# Patient Record
Sex: Female | Born: 1935
Health system: Southern US, Community
[De-identification: ages and names within clinical notes are randomized; demographics above are authoritative.]

## PROBLEM LIST (undated history)

## (undated) ENCOUNTER — Emergency Department (HOSPITAL_COMMUNITY): Admission: EM | Disposition: A | Discharge status: Home / Self Care | Payer: Medicare Other

## (undated) DIAGNOSIS — K922 Gastrointestinal hemorrhage, unspecified: Secondary | ICD-10-CM

## (undated) DIAGNOSIS — K229 Disease of esophagus, unspecified: Secondary | ICD-10-CM

## (undated) DIAGNOSIS — M1990 Unspecified osteoarthritis, unspecified site: Secondary | ICD-10-CM

## (undated) DIAGNOSIS — E785 Hyperlipidemia, unspecified: Secondary | ICD-10-CM

## (undated) DIAGNOSIS — C641 Malignant neoplasm of right kidney, except renal pelvis: Secondary | ICD-10-CM

## (undated) DIAGNOSIS — R519 Headache, unspecified: Secondary | ICD-10-CM

## (undated) DIAGNOSIS — I10 Essential (primary) hypertension: Secondary | ICD-10-CM

## (undated) DIAGNOSIS — N289 Disorder of kidney and ureter, unspecified: Secondary | ICD-10-CM

## (undated) DIAGNOSIS — K219 Gastro-esophageal reflux disease without esophagitis: Secondary | ICD-10-CM

## (undated) DIAGNOSIS — K635 Polyp of colon: Secondary | ICD-10-CM

## (undated) DIAGNOSIS — I5032 Chronic diastolic (congestive) heart failure: Secondary | ICD-10-CM

## (undated) DIAGNOSIS — E039 Hypothyroidism, unspecified: Secondary | ICD-10-CM

## (undated) DIAGNOSIS — R51 Headache: Secondary | ICD-10-CM

## (undated) DIAGNOSIS — K559 Vascular disorder of intestine, unspecified: Secondary | ICD-10-CM

## (undated) DIAGNOSIS — Z8619 Personal history of other infectious and parasitic diseases: Secondary | ICD-10-CM

## (undated) DIAGNOSIS — K8000 Calculus of gallbladder with acute cholecystitis without obstruction: Secondary | ICD-10-CM

## (undated) DIAGNOSIS — F419 Anxiety disorder, unspecified: Secondary | ICD-10-CM

## (undated) DIAGNOSIS — R9431 Abnormal electrocardiogram [ECG] [EKG]: Secondary | ICD-10-CM

## (undated) HISTORY — DX: Anxiety disorder, unspecified: F41.9

## (undated) HISTORY — DX: Abnormal electrocardiogram (ECG) (EKG): R94.31

## (undated) HISTORY — DX: Essential (primary) hypertension: I10

## (undated) HISTORY — DX: Gastro-esophageal reflux disease without esophagitis: K21.9

## (undated) HISTORY — PX: CATARACT EXTRACTION: SUR2

## (undated) HISTORY — DX: Disorder of kidney and ureter, unspecified: N28.9

## (undated) HISTORY — DX: Hyperlipidemia, unspecified: E78.5

## (undated) HISTORY — DX: Vascular disorder of intestine, unspecified: K55.9

## (undated) HISTORY — PX: COLONOSCOPY: SHX174

## (undated) HISTORY — DX: Polyp of colon: K63.5

## (undated) HISTORY — DX: Personal history of other infectious and parasitic diseases: Z86.19

## (undated) HISTORY — DX: Calculus of gallbladder with acute cholecystitis without obstruction: K80.00

## (undated) HISTORY — PX: HERNIA REPAIR: SHX51

## (undated) HISTORY — DX: Malignant neoplasm of right kidney, except renal pelvis: C64.1

---

## 1976-11-26 HISTORY — PX: ABDOMINAL HYSTERECTOMY: SHX81

## 1980-11-26 HISTORY — PX: NEPHRECTOMY: SHX65

## 1999-03-09 ENCOUNTER — Other Ambulatory Visit: Admission: RE | Admit: 1999-03-09 | Discharge: 1999-03-09 | Payer: Self-pay | Admitting: Gynecology

## 1999-09-26 ENCOUNTER — Ambulatory Visit (HOSPITAL_COMMUNITY): Admission: RE | Admit: 1999-09-26 | Discharge: 1999-09-26 | Payer: Self-pay | Admitting: Gastroenterology

## 1999-09-26 ENCOUNTER — Encounter (INDEPENDENT_AMBULATORY_CARE_PROVIDER_SITE_OTHER): Payer: Self-pay

## 1999-12-14 ENCOUNTER — Encounter: Admission: RE | Admit: 1999-12-14 | Discharge: 1999-12-14 | Payer: Self-pay | Admitting: Urology

## 1999-12-14 ENCOUNTER — Encounter: Payer: Self-pay | Admitting: Urology

## 2000-06-10 ENCOUNTER — Inpatient Hospital Stay (HOSPITAL_COMMUNITY): Admission: EM | Admit: 2000-06-10 | Discharge: 2000-06-12 | Payer: Self-pay | Admitting: Gastroenterology

## 2000-06-10 ENCOUNTER — Encounter (INDEPENDENT_AMBULATORY_CARE_PROVIDER_SITE_OTHER): Payer: Self-pay

## 2000-06-10 ENCOUNTER — Encounter: Payer: Self-pay | Admitting: Gastroenterology

## 2000-07-19 ENCOUNTER — Encounter: Payer: Self-pay | Admitting: Gastroenterology

## 2000-07-19 ENCOUNTER — Encounter (INDEPENDENT_AMBULATORY_CARE_PROVIDER_SITE_OTHER): Payer: Self-pay

## 2000-07-19 ENCOUNTER — Inpatient Hospital Stay (HOSPITAL_COMMUNITY): Admission: EM | Admit: 2000-07-19 | Discharge: 2000-07-25 | Payer: Self-pay | Admitting: *Deleted

## 2000-07-23 ENCOUNTER — Encounter: Payer: Self-pay | Admitting: Gastroenterology

## 2001-02-21 ENCOUNTER — Emergency Department (HOSPITAL_COMMUNITY): Admission: EM | Admit: 2001-02-21 | Discharge: 2001-02-21 | Payer: Self-pay | Admitting: Emergency Medicine

## 2001-02-21 ENCOUNTER — Encounter: Payer: Self-pay | Admitting: Emergency Medicine

## 2001-03-03 ENCOUNTER — Ambulatory Visit (HOSPITAL_COMMUNITY): Admission: RE | Admit: 2001-03-03 | Discharge: 2001-03-03 | Payer: Self-pay | Admitting: Gastroenterology

## 2001-03-03 ENCOUNTER — Encounter (INDEPENDENT_AMBULATORY_CARE_PROVIDER_SITE_OTHER): Payer: Self-pay

## 2001-12-24 ENCOUNTER — Emergency Department (HOSPITAL_COMMUNITY): Admission: EM | Admit: 2001-12-24 | Discharge: 2001-12-24 | Payer: Self-pay | Admitting: Emergency Medicine

## 2001-12-31 ENCOUNTER — Other Ambulatory Visit: Admission: RE | Admit: 2001-12-31 | Discharge: 2001-12-31 | Payer: Self-pay | Admitting: Gynecology

## 2002-03-09 ENCOUNTER — Encounter: Admission: RE | Admit: 2002-03-09 | Discharge: 2002-03-09 | Payer: Self-pay | Admitting: Urology

## 2002-03-09 ENCOUNTER — Encounter: Payer: Self-pay | Admitting: Urology

## 2003-04-30 ENCOUNTER — Emergency Department (HOSPITAL_COMMUNITY): Admission: EM | Admit: 2003-04-30 | Discharge: 2003-04-30 | Payer: Self-pay | Admitting: Emergency Medicine

## 2003-07-05 ENCOUNTER — Encounter: Payer: Self-pay | Admitting: Urology

## 2003-07-05 ENCOUNTER — Encounter: Admission: RE | Admit: 2003-07-05 | Discharge: 2003-07-05 | Payer: Self-pay | Admitting: Urology

## 2004-12-13 ENCOUNTER — Ambulatory Visit: Payer: Self-pay

## 2005-01-11 ENCOUNTER — Other Ambulatory Visit: Admission: RE | Admit: 2005-01-11 | Discharge: 2005-01-11 | Payer: Self-pay | Admitting: Gynecology

## 2005-09-28 ENCOUNTER — Ambulatory Visit: Payer: Self-pay | Admitting: Cardiovascular Disease

## 2005-10-04 ENCOUNTER — Ambulatory Visit: Payer: Self-pay | Admitting: Cardiology

## 2005-12-13 ENCOUNTER — Ambulatory Visit: Payer: Self-pay | Admitting: Cardiovascular Disease

## 2006-01-04 ENCOUNTER — Ambulatory Visit: Payer: Self-pay | Admitting: Internal Medicine

## 2006-04-08 ENCOUNTER — Ambulatory Visit: Payer: Self-pay

## 2006-08-13 ENCOUNTER — Emergency Department (HOSPITAL_COMMUNITY): Admission: EM | Admit: 2006-08-13 | Discharge: 2006-08-13 | Payer: Self-pay | Admitting: Emergency Medicine

## 2007-09-30 ENCOUNTER — Encounter (INDEPENDENT_AMBULATORY_CARE_PROVIDER_SITE_OTHER): Payer: Self-pay | Admitting: Surgery

## 2007-09-30 ENCOUNTER — Ambulatory Visit (HOSPITAL_BASED_OUTPATIENT_CLINIC_OR_DEPARTMENT_OTHER): Admission: RE | Admit: 2007-09-30 | Discharge: 2007-09-30 | Payer: Self-pay | Admitting: Surgery

## 2008-11-26 DIAGNOSIS — C641 Malignant neoplasm of right kidney, except renal pelvis: Secondary | ICD-10-CM

## 2008-11-26 DIAGNOSIS — K635 Polyp of colon: Secondary | ICD-10-CM

## 2008-11-26 HISTORY — DX: Malignant neoplasm of right kidney, except renal pelvis: C64.1

## 2008-11-26 HISTORY — DX: Polyp of colon: K63.5

## 2008-11-26 HISTORY — PX: NEPHRECTOMY: SHX65

## 2008-11-26 LAB — HM COLONOSCOPY

## 2009-03-04 ENCOUNTER — Ambulatory Visit: Payer: Self-pay | Admitting: Vascular Surgery

## 2009-06-28 ENCOUNTER — Encounter: Admission: RE | Admit: 2009-06-28 | Discharge: 2009-06-28 | Payer: Self-pay | Admitting: Gynecology

## 2009-12-20 ENCOUNTER — Encounter: Admission: RE | Admit: 2009-12-20 | Discharge: 2009-12-20 | Payer: Self-pay | Admitting: Emergency Medicine

## 2010-03-30 ENCOUNTER — Ambulatory Visit (HOSPITAL_COMMUNITY): Admission: RE | Admit: 2010-03-30 | Discharge: 2010-03-30 | Payer: Self-pay | Admitting: *Deleted

## 2010-12-16 ENCOUNTER — Encounter: Payer: Self-pay | Admitting: Cardiovascular Disease

## 2010-12-17 ENCOUNTER — Encounter: Payer: Self-pay | Admitting: Emergency Medicine

## 2011-03-12 ENCOUNTER — Other Ambulatory Visit: Payer: Self-pay | Admitting: Gastroenterology

## 2011-03-12 LAB — HM COLONOSCOPY

## 2011-04-03 LAB — LIPID PANEL
LDL Cholesterol: 142 mg/dL
LDl/HDL Ratio: 6.8

## 2011-04-04 LAB — LIPID PANEL: Cholesterol: 239 mg/dL — AB (ref 0–200)

## 2011-04-10 NOTE — Op Note (Signed)
Sherri Burns, Sherri Burns             ACCOUNT NO.:  [id number]   MEDICAL RECORD NO.:  [id number]          PATIENT TYPE:  AMB   LOCATION:  DSC                          FACILITY:  [facility]   PHYSICIAN:  Thornton Park. Daphine Deutscher, MD  DATE OF BIRTH:  1936/07/15   DATE OF PROCEDURE:  09/30/2007  DATE OF DISCHARGE:  09/30/2007                               OPERATIVE REPORT   PREOPERATIVE DIAGNOSIS:  Mass of the umbilicus, probable keloid.   POSTOPERATIVE DIAGNOSIS:  Mass of the umbilicus, probable keloid.   PROCEDURE:  Excision of mass of umbilicus.   SURGEON:  Thornton Park. Daphine Deutscher, M.D.   ANESTHESIA:  MAC with local, injection of Kenalog.   DESCRIPTION OF PROCEDURE:  Ms. Topping was taken to room 5 at [facility] on September 30, 2007, and given intravenous sedation.  The area  was prepped with TechniCare and draped sterilely.  I infiltrated it with  a mixture Marcaine and lidocaine.  I then excised basically an ellipse  vertically excising this hard mass.  Cutting through the roots, it felt  like it was a keloid.  Once I removed it, I cauterized the base and  mobilized the skin.  I then closed this with 4-0 Vicryl subcutaneously  and subcuticularly and with a single vertical mattress suture of 5-0  nylon.  I injected this with Kenalog at the end, put a cotton ball  there, and dressed it and sent her home.  She will take Vicodin as  needed for pain.  She will come back for suture removal in one week.      Thornton Park Daphine Deutscher, MD  Electronically Signed     MBM/MEDQ  D:  09/30/2007  T:  09/30/2007  Job:  [id number]   cc:   Brett Canales A. Cleta Alberts, M.D.  Fax: [phone]

## 2011-04-10 NOTE — Procedures (Signed)
DUPLEX DEEP VENOUS EXAM - LOWER EXTREMITY   INDICATION:  Right lower extremity pain intermittently, more recently.   HISTORY:  Edema:  Right lower extremity.  Trauma/Surgery:  No.  Pain:  Right lower extremity.  PE:  No.  Previous DVT:  No.  Anticoagulants:  No.  Other:   DUPLEX EXAM:                CFV   SFV   PopV  PTV    GSV                R  L  R  L  R  L  R   L  R  L  Thrombosis    o  o  o     o     o      o  Spontaneous   +  +  +     +     +      +  Phasic        +  +  +     +     +      +  Augmentation  +  +  +     +     +      +  Compressible  +  +  +     +     +      +  Competent     +  +  +     +     +      +   Legend:  + - yes  o - no  p - partial  D - decreased   IMPRESSION:  1. No evidence of deep vein thrombosis or superficial  vein thrombosis      in the right lower extremity or the left common femoral vein.  2. Note:  Right posterior tibial artery appears normal and triphasic.    _____________________________  Quita Skye. Hart Rochester, M.D.   AS/MEDQ  D:  03/04/2009  T:  03/04/2009  Job:  [id number]

## 2011-04-13 NOTE — H&P (Signed)
[facility]  Patient:    Sherri Burns, Sherri Burns                    MRN: [id number] Adm. Date:  [id number] Attending:  Deneen Harts CC:         Lonzo Cloud. Kriste Basque, M.D. [facility]   History and Physical  REASON FOR ADMISSION:  Ms. Bleecker is a 75 year old African-American female, who presents with abdominal pain and leukocytosis.  HISTORY OF PRESENT ILLNESS:  The patient was hospitalized approximately six weeks ago with acute ischemic colitis.  It has resolved with conservative measures.  She was seen subsequently in follow-up and was doing well.  Several weeks ago, she developed an acute upper respiratory tract infection, for which she saw her primary care physician, Dr. Alroy Dust.  Antibiotic therapy was prescribed, and she has completed a two-week (?) course of this and was started on a Medrol dosepak.  Her symptoms have improved.  Beginning two days ago, the patient began developing significant abdominal distention.  Associated abdominal pain with lower quadrant cramping.  Today she passed a hard stool, followed by copious watery diarrhea, approximately eight to 10 bowel movements.  She is concerned that recurrent ischemic colitis had developed.  No hematochezia noted, however.  The patient denies constitutional symptoms of nausea, vomiting.  Appetite is poor.  No fever, rigor, or chill.  Pain is bilateral lower quadrant cramping, partially relieved with bowel movements.  PAST MEDICAL HISTORY: 1. Hypertension. 2. Hypercholesterolemia. 3. Reflux. 4. IBS.  REVIEW OF SYSTEMS:  Fatigue, weakness.  Marked decreased appetite.  Bronchitic symptoms, resolved.  PHYSICAL EXAMINATION:  GENERAL:  Mildly ill-appearing, middle-aged African-American female.  VITAL SIGNS:  Stable without fever.  HEENT:  Anicteric sclerae, pink conjunctivae.  No oropharyngeal lesions.  NECK:  Supple without adenopathy, no thyromegaly or bruit.  CHEST:  Clear to auscultation  without adventitious sounds.  CARDIAC:  Regular rhythm, no gallops or murmur.  ABDOMEN:  Obese, soft, nondistended, tender lower quadrants greater than upper quadrants, no rebound, no guarding.  No firmness, no mass.  No organomegaly.  RECTAL:  No perianal or intrarectal pathology.  Stool:  Watery, Hemoccult-negative, with positive quality control.  EXTREMITIES:  Without clubbing, cyanosis, or edema.  SKIN:  No lesion.  LABORATORY DATA:  CBC with differential:  WBC 26.1 thousand with 88% neutrophils.  Hemoglobin 15, hematocrit 45.  CMET:  Abnormal for a calcium 10.9, total protein 8.1, glucose 174, otherwise normal.  ASSESSMENT: 1. Abdominal pain. 2. Leukocytosis.  The etiology of patients current presentation is uncertain.  I am most concerned about the possibility of an intraperitoneal abscess or intestinal perforation, given recent past history of ischemic colitis.  Although fever would be expected, this may be masked by initiation of a Medrol dosepak yesterday.  Leukocytosis is clearly worrisome, and this is the main finding leading to hospital stay.  The patient will be closely monitored.  Other considerations include diverticulitis, cystitis, pancreatitis.  RECOMMENDATION: 1. Admit. 2. IV fluids. 3. Will hold empiric antibiotics unless fever develops or patients condition    worsens. 4. Diagnostic workup:  Abdominal CT, CBC.  Stool workup:  C/S, C. difficile    toxin, O/P, fecal leukocytes.  Urinalysis.  Check parathyroid hormone level    given hypercalcemia. 5. Abdominal/pelvic CT. DD:  07/19/00 TD:  07/20/00 Job: [id number]

## 2011-04-13 NOTE — Discharge Summary (Signed)
[facility]  Patient:    Sherri Burns, Sherri Burns                    MRN: [id number] Adm. Date:  [id number] Disc. Date: [id number] Attending:  Deneen Harts CC:         Sherri Cloud. Sherri Burns, M.D. Sherri Burns  Sherri Burns, M.D.  Sherri Burns, M.D.   Discharge Summary  DISCHARGE DIAGNOSES: 1. Ischemic colitis. 2. Pseudomembranous colitis. 3. Candida esophagitis. 4. Secondary hyperparathyroidism. 5. Hypertension. 6. Hyperlipidemia.  HOSPITAL COURSE:  Sherri Burns is a 75 year old African-American female who was admitted through the emergency room with symptoms of abdominal distention, lower abdominal pain, diarrhea.  She had a marked leukocytosis greater than 26,000 on admission.  Recent past history is significant for an acute episode of ischemic colitis approximately one month previously.  This was thought to be due to birth control pills, and she was subsequently switched to an estrogen patch.  She was also being treated with antihypertensives including Prinivil and Cardizem 180 mg daily.  The patients leukocytosis gradually improved with supportive therapy including bowel rest, bed rest, IV fluids.  She was hemoccult positive, and this subsequently became negative on the fifth hospital day.  Colonoscopy was performed with excellent visualization to the right colon which was poorly seen due to unprepped state.  Biopsies were not obtained, and colonoscopic appearance was classic with nodular ulceration well demarcated and limited to the region of the splenic flexure.  The patient steadily improved, and her diet was advanced.  Endoscopy was performed at the time of colonoscopy because of complaint of foreign body sensation at the base of the throat and burning substernal chest pain.  She was found to have Candida esophagitis for which Diflucan was initiated.  Hypertension was, too, well controlled with blood pressure falling as low as 74/50.  This led to  discontinuation of Prinivil and reduction of Cardizem 180 mg to 120 mg daily.  With this, blood pressure remained within normal limits.  This raises the possibility of antihypertensive therapy inducing ischemic colitis because of transient hypotension.  Finally, the patient was noted to be hypercalcemic on admission with slight elevation of calcium on routine panel at 10.9, upper normal 10.6.  Parathyroid hormone was found to be markedly elevated, approximately twice normal at 151, normal range 12-72.  Endocrine consultation provided by Dr. Reather Burns with an assessment of probable secondary hyperparathyroidism due to her catabolic state and dehydration.  Additional laboratory included conversion of positive hemoccult to negative on the fifth hospital day.  Leukocytosis improved from 26,000 to 13,700 by July 24, 2000.  Sed rate remained normal at 17. TSH was normal at 2.6. Hemoglobin and hematocrit remained normal throughout.  Stool was positive for C. difficile toxin, negative C/S.  Moderate leukocytosis was present.  No ova or parasite identified.  Final study performed was that of an MRI which documented widely patent superior mesenteric artery, IMA, and celiac access.  Minimal proximal disease without evidence of stenosis.  Abdominal/pelvic CT consistent with a right nephrectomy remotely.  No other abnormality was identified.  RECOMMENDATIONS: 1. The patient is discharged home in stable condition. 2. Low-residue diet. 3. Progressive activity ad lib. 4. Discontinue Prinivil. 5. Reduce Cardizem 120 mg daily. 6. Complete 10-day course of Flagyl 250 mg q.i.d. for pseudomembranous    colitis. 7. Complete 10-day course of Diflucan 100 mg daily for Candida esophagitis. 8. Discontinue diuretic.  DISCHARGE MEDICATIONS: 1. Cardizem 120 mg 1 p.o. q.a.m. 2. Flagyl  250 mg 1 p.o. q.d. for 6 days. 3. Diflucan 100 mg daily for 6 days. 4. Xanax 0.5 mg, 1/2-1 tablet p.o. t.i.d. p.r.n. 5.  Lipitor 20 mg daily.  FOLLOWUP:  ______ to see Dr. Kinnie Scales in one to two weeks.  ______ to see Dr. Kriste Burns in two to four weeks for monitoring of antihypertensive therapy and to follow up probably secondary hypoparathyroidism with repeat calcium/intact parathyroid hormone level in approximately one month. DD:  07/25/00 TD:  07/26/00 Job: [id number]

## 2011-04-13 NOTE — Discharge Summary (Signed)
[facility]  Patient:    Sherri Burns, Sherri Burns                    MRN: [id number] Adm. Date:  [id number] Disc. Date: [id number] Attending:  Deneen Harts CC:         Beryle Lathe, M.D.             Lonzo Cloud. Kriste Basque, M.D. [facility]                           Discharge Summary  DISCHARGE DIAGNOSES:  1. Acute ischemic colitis.  2. Hypertension.  3. Chronic bronchitis.  4. Gastroesophageal reflux disease.  5. Colon adenomatous polyp.  6. Chronic constipation.  7. Anxiety.  8. Hypertension.  9. Hyperlipidemia. 10. Allergy.  HOSPITAL COURSE:  Patient presented to the hospital with a 24-hour history of rectal bleeding characterized by crampy abdominal pain and bloody mucoid stool, low-grade fever and leukocytosis on admission.  Physical exam with abdominal pain.  Emergency colonoscopy for diagnostic purposes was performed. This revealed an acute ischemic left colon.  Biopsies were obtained; pathology pending.  The patient was placed on bedrest and bowel rest.  IV fluids were administered along with analgesia.  Over the next 48 hours, patients symptoms have promptly subsided.  At the time of discharge, her abdominal pain has resolved.  She has not had a bowel movement since colonoscopy.  She is tolerating a low residue diet.  Fever and leukocytosis have resolved.  There has been no rectal bleeding.  The most likely promoter of ischemic colitis in this individual is severe constipation, treated with aggressive magnesium citrate laxative; hormone-replacement therapy is also suspect.  CURRENT MEDICAL REGIMEN:  1. Xanax 0.5 mg q.8h. p.r.n.  2. Zocor 40 mg daily.  3. Prevacid 30 mg daily.  4. Diltiazem CD 180 mg daily.  5. Prinivil 10 mg daily.  6. Miralax one capful daily.  DISCHARGE INSTRUCTIONS:  1. Resume current medical regimen except Premarin and Estratest are to be     discontinued.  2. Low residue diet for one month.  3. Physical activity to be limited  over the next week to ambulation ad lib     but no physical exertion.  4. Return office visit:  One week.  5. Resume Miralax one capful nightly. DD:  06/12/00 TD:  06/13/00 Job: [id number]

## 2011-04-13 NOTE — H&P (Signed)
[facility]  Patient:    Sherri Burns, Sherri Burns                    MRN: [id number] Adm. Date:  [id number] Attending:  Deneen Harts                         History and Physical  REASON FOR ADMISSION:  Acute ischemic colitis.  HISTORY OF PRESENT ILLNESS:  [name] developed acute onset of abdominal pain yesterday evening, localized to the left lower quadrant, exacerbated with bowel movements.  These were bloody with bright red blood and maroon clot.  No stool.  Patient has a history of chronic constipation, which has been relieved with Miralax.  She became constipated after her Miralax ran out for five days, then used a bottle of magnesium citrate with subsequent symptoms, as noted. These continued until approximately midnight.  She was seen earlier this morning in the office and emergency colonoscopy was performed.  Colonoscopy revealed acute left colon ischemic colitis.  Biopsy taken.  The rectosigmoid and proximal transverse and ascending colon appeared normal.  In addition, patient has known diverticulosis but there was no evidence of acute diverticulitis; also with a history of colon polyps.  No additional neoplasia identified but this could easily have been missed due to the acute inflammation noted.  Patient is being admitted for supportive therapy, IV hydration and observation.  PAST MEDICAL HISTORY:  1. Hypertension.  2. Bronchitis.  3. GERD.  4. Colon adenomatous polyps.  5. Chronic constipation.  6. Anxiety.  7. Hypertension.  8. Hyperlipidemia.  9. Allergy.  PAST SURGICAL HISTORY:  Status post hysterectomy, right nephrectomy due to hypernephroma.  Abdominal lysis of adhesions.  CURRENT MEDICAL REGIMEN:  1. Prilosec 20 mg daily.  2. Miralax one capful daily.  3. Alprazolam.  4. Lipitor.  5. Diltiazem.  6. Prinivil.  7. Estrace.  8. Premarin.  9. Allegra D p.r.n. 10. Aspirin. 12. MVI. 13. Garlic. 14. Vitamin  E.  ALLERGY:  CODEINE.  SOCIAL HISTORY:  Patient is married, living with husband.  Retired.  Rare alcohol use.  No drug use.  FAMILY HISTORY:  Significant for mother with colon cancer, father and sister with esophageal cancer.  REVIEW OF SYSTEMS:  Overall health excellent.  CARDIAC:  History of murmur. RESPIRATORY:  History of bronchitis.  ENDOCRINE:  No history of diabetes. RENAL:  Status post nephrectomy.  No insufficiency.  MUSCULOSKELETAL:  No arthritis.  NEUROLOGIC:  Denies seizure disorder.  PHYSICAL EXAMINATION:  GENERAL:  Healthy-appearing, middle-aged African-American female, alert and oriented, in no acute distress.  VITAL SIGNS:  Stable with tachycardia, pulse 126, respirations 16, temperature 98.1, blood pressure 140/108.  Height 5 feet 6 inches, weight 179 pounds.  HEENT:  Anicteric sclerae.  Pink conjunctivae.  No oropharyngeal lesion.  NECK:  Supple.  No adenopathy or thyromegaly or bruit.  CHEST:  Clear to auscultation without adventitial sounds.  CARDIAC:  Regular rhythm.  No gallop and no murmur appreciated.  ABDOMEN:  Tender left lower quadrant.  No rebound.  No guarding.  No firmness or mass.  Bowel sounds active.  No borborygmi, bruit or splash.  EXTREMITIES:  No clubbing, cyanosis, or edema.  SKIN:  No lesion.  LABORATORY AND X-RAY FINDINGS:  CBC:  Abnormal WBC at 15,900, hemoglobin 15.8, hematocrit 50.2 -- probably due to hemoconcentration.  MCV 101, MCHC 31.4, platelets 305,000.  Colonoscopy -- see report.  ASSESSMENT:  Acute ischemic colitis --  left colon, probably exacerbated by estrogen therapy.  RECOMMENDATION:  Admit for IV hydration, observation, supportive therapy. DD:  06/10/00 TD:  06/11/00 Job: [id number]

## 2011-04-13 NOTE — Procedures (Signed)
[facility]  Patient:    Sherri Burns, Sherri Burns                    MRN: [id number] Proc. Date: 07/22/00 Adm. Date:  [id number] Attending:  Deneen Harts CC:         Lonzo Cloud. Kriste Basque, M.D. [facility]                           Procedure Report  PROCEDURE:  Colonoscopy.  INDICATIONS:  The patient is a 75 year old African-American female, admitted with a recent past history of ischemic colitis with recurrent episode, six weeks later of abdominal pain, diarrhea, occult positive stool, with leukocytosis.  She is undergoing colonoscopy to evaluate possible recurrent ischemic colitis.  Also with a finding of C. difficile toxin on stool. Culture to rule out pseudomembranous colitis.  The patient did receive an intercurrent course of antibiotic for acute bronchitis approximately one month ago.  INFORMED CONSENT:  After reviewing the nature of the procedure with the patient including potential risks and complications, and after discussing alternative methods of diagnosis and treatment, informed consent was signed.  PREMEDICATION: The patient was premedicated receiving Versed 10 mg, fentanyl 100 mcg administered in divided doses prior to the onset of the procedure.  DESCRIPTION OF PROCEDURE:  Using the Olympus PCF-140L video colonoscope, the rectum was intubated after normal digital examination.  The scope was advanced around the entire length of the colon to the cecum identified by the ileocecal valve.  The colon was unprepped.  The right colon had a film of stool coating it throughout its length.  This was irrigated clear to rule out underlying pseudomembranes and/or acute inflammation.  This appeared normal.  The scope was retracted with careful inspection throughout the colon in a retrograde manner.  In the distal transverse colon and involving the splenic flexure and proximal descending colon, approximately 15 cm of acutely inflamed colon.  The inflammation was  circumferential nodular, ulcerative with edema and erythema. This was consistent with ischemic colitis.  I would have expected much more healing if this were due to the ischemic insult approximately six weeks ago. I suspect this represents acute superimposed on chronic ischemic change. Biopsies were not obtained.  The remainder of the descending colon, sigmoid and rectum were notable only for sigmoid diverticulosis which was not inflamed.  No pseudomembranes were identified.  No evidence of acute infectious colitis seen.  The colon was decompressed, the scope withdrawn.  The patient tolerated the procedure without difficulty, being maintained on Datascope monitor and low-flow oxygen throughout.  Time:  One.  Technical:  One.  Preparation:  Three.  Total score equaled five.  ASSESSMENT: 1. Ischemic colitis--superimposed on chronic changes. 2. No evidence of pseudomembranes identified. 3. Diverticulosis--moderate, sigmoid involvement. 4. Right colon poorly seen due to unprepped colonoscopy.  RECOMMENDATIONS: 1. Continue supportive measures. 2. MRI to access superior mesenteric artery and inferior mesenteric    artery blood flow to the colon. 3. Flagyl 250 mg q.i.d. x 10 days to treat C. difficile toxin. 4. Gradually advance diet. 5. Change antihypertensive--patient presently on Cardizem and Prinivil.    Noted to have blood pressure as low as 74/50.  This may be the etiology of    recurrent ischemic colitis. DD:  07/22/00 TD:  07/23/00 Job: [id number]

## 2011-04-13 NOTE — Consult Note (Signed)
[facility]  Patient:    Sherri Burns, Sherri Burns                    MRN: [id number] Adm. Date:  [id number] Attending:  Deneen Harts CC:         Lonzo Cloud. Kriste Basque, M.D. [facility]             Griffith Citron, M.D.                          Consultation Report  REASON FOR CONSULTATION:  Evaluate parathyroid.  HISTORY OF PRESENT ILLNESS:  This is a 75 year old Afro-American patient admitted on August 24 for ischemic colitis.  The patients problem has been a recurrent problem for the last six weeks or so with prior problems with ischemic colitis in July also.  She has been acutely ill on the date of admission and had been somewhat dehydrated and sweaty.  Her calcium on admission was 10.9.  She also had mild increase in BUN and creatinine to 23 and 1.5, respectively, and a total protein of 8.1, which subsequently came down to 6.1.  Her calcium subsequently came down to 9 on August 29.  The patient has had no symptoms of bone pain except for pain on the right side of her hip, which sounds like bursitis, radiating down the leg.  She also has had some pain and stiffness in her fingers, especially in the morning.  She has not had any generalized bone pain.  She has had some low back pain related to her work.  She has not had any peptic ulcers or kidney stones.  The patient currently is asymptomatic except for abdominal symptoms.  MEDICATIONS:  Medications on admission were estrogen patch, Lipitor, Xanax, Prilosec, Prinivil, and diltiazem.  ALLERGIES:  CODEINE.  PAST MEDICAL HISTORY:  The patient has had a nephrectomy in 1982 for malignancy.  She has not had any fractures.   She has not had any other surgeries.  PERSONAL HISTORY:  She is a nonsmoker.  She is retired.  REVIEW OF SYSTEMS:  The patient has had hypertension, hypercholesterolemia, reflux, irritable bowel syndrome, and as above, she has had ischemic colitis and Candida esophagitis, for which she was  being treated.  Noticed to have diabetes with a glucose of 174 on admission.  She is menopausal, on estrogen replacement.  She was previously on multivitamins but none recently.  She had lost weight from not eating, probably about 12 pounds.  PHYSICAL EXAMINATION:  GENERAL:  The patient is alert and cooperative, very pleasant.  VITAL SIGNS:  Blood pressure is 120/76, temperature 98.9, pulse 72 and regular.  SKIN:  There is no pallor, lymphadenopathy, or edema.  HEENT:  Eyes:  Pupils are equal.  No pallor or conjunctival reaction.  Fundi not examined.  ENT exam was normal.  NECK:  No carotid bruit, no thyroid enlargement, no lymphadenopathy.  HEART:  Heart sounds are normal.  LUNGS:  Clear.  ABDOMEN:  Mild lower quadrant tenderness, no liver or spleen enlargement felt.  EXTREMITIES:  Normal.  ASSESSMENT:  This patient has a single high calcium on admission.  This in the face of dehydration and acute illness.  The patient apparently has been noted to have a prior history of hypercalcemia.  The parathyroid hormone level may or may not indicate a primary parathyroid hyperplasia or adenoma since this was done soon after the patient had been acutely ill, with poor intake.  This may be simply a reaction to poor dietary calcium intake and inactivity. Currently the patient is asymptomatic.  RECOMMENDATIONS:  I would recommend a repeat calcium level done subsequently as an outpatient once the patient is well.  If the calcium is high normal, a parathyroid hormone may be repeated; but with the patients asymptomatic status, one would be reluctant to recommend any parathyroid exploration.  She may also have a bone density screen done and if this is low, then a full evaluation for hyperparathyroidism may be more worthwhile.  Thanks for the consultation.  Please feel free to call me if there are any further questions. DD:  07/25/00 TD:  07/25/00 Job: [id number]

## 2011-04-13 NOTE — Procedures (Signed)
[facility]  Patient:    Sherri Burns, Sherri Burns                    MRN: [id number] Proc. Date: 06/10/00 Adm. Date:  [id number] Attending:  Deneen Harts CC:         Lonzo Cloud. Kriste Basque, M.D. [facility]                           Procedure Report  PROCEDURE:  Colonoscopy with biopsy.  INDICATION FOR PROCEDURE:  A 75 year old African-American female undergoing colonoscopy to evaluate acute onset of left lower quadrant abdominal pain with hematochezia. Onset of symptoms beginning yesterday afternoon. Approximately a dozen trips to the bathroom with passage of bright red blood and/or maroon clot. Associated weakness. No orthostatic symptoms. Prior colonoscopy with segmental colitis. Also with tubular adenoma. Undergoing colonoscopy on an urgent basis without prior preparation to further evaluate etiology of her symptoms. In addition, the patient is known to have universal diverticulosis which is mild.  DESCRIPTION OF PROCEDURE:  After reviewing the nature of the procedure with the patient including potential risks and complications, and after discussing alternative methods of diagnosis and treatment, informed consent was signed.  The patient was premedicated receiving IV sedation totaling versed 10 mg, fentanyl 100 mcg administered IV in divided doses prior to and during the course of the procedure.  Using an Olympus pediatric PCF-140L video colonoscope, the rectum was intubated after a digital examination revealed no evidence of perianal or intrarectal pathology.  The scope was advanced through a spastic sigmoid colon with modest difficulty. Heme was noted throughout the rectosigmoid. No solid stool. The scope advanced around the hepatic flexure into the mid ascending colon. The cecum was not reached due to poor preparation and patient complaints of abdominal pain. I did not wish to be overly vigorous given the findings of acute left-sided colitis.  The scope was  slowly withdrawn with careful inspection of the entire colon in a retrograde manner. The colon was normal through the right hepatic flexure, transverse colon and splenic flexure. The descending colon, through its entirety was involved with an acute inflammatory process. There were skip lesions of intense erythema edema mucosal ulceration with friability. Purplish discoloration of the involved mucosa. Biopsies obtained. Findings most consistent with an acute ischemic colitis. This extended to the mid sigmoid colon. Distal to this, beyond 25 cm from the rectum, the rectosigmoid was normal.  Diverticulosis was scattered throughout the colon, no evidence of acute inflammation.  The colon was decompressed, scope withdrawn.  The patient tolerated the procedure with mild discomfort but without complications. Maintained on DataScope monitor and low flow oxygen throughout.  Time 2, technical 2, preparation 3, total score equal 7.  ASSESSMENT:  Acute left colon ischemic colitis--biopsy pending.  RECOMMENDATION: 1. The patient will be admitted for support, monitoring, IV hydration. 2. Follow-up pathology. DD:  06/10/00 TD:  06/11/00 Job: [id number]

## 2011-04-13 NOTE — Procedures (Signed)
[facility]  Patient:    Sherri Burns, Sherri Burns                    MRN: [id number] Proc. Date: 07/22/00 Adm. Date:  [id number] Attending:  Deneen Harts CC:         Lonzo Cloud. Kriste Basque, M.D. [facility]   Procedure Report  PROCEDURE:  Panendoscopy with brushing.  INDICATIONS:  A 75 year old African-American female admitted to the hospital three days ago with recurrent ischemic colitis.  Over the past several days with sensation of a foreign body at the base of the throat, substernal and epigastric burning discomfort despite Protonix 40 mg daily.  Known history of reflux disease.  Undergoing endoscopy to further evaluate possible upper GI pathology.  DESCRIPTION OF PROCEDURE:  Immediately following colonoscopy, without benefit of further IV sedation, endoscopy successfully completed using an Olympus video endoscope.  Topical oropharyngeal anesthetic sprayed.  The oropharynx was normal without lesion of the epiglottis, vocal cords, or piriform sinus other than posterior pharyngeal exudate.  Upper esophageal sphincter easily traversed.  The proximal and mid esophagus were notable for colonies of white exudate studding the mucosal lining.  In the distal esophagus, this was less prominent.  Mucosal Z-line was distinct at 38 cm.  No hiatal hernia.  Gastric fundus, body, and antrum were normal.  Pylorus symmetric.  Duodenal bulb and second portion normal.  Retroflex view of the angularis, lesser curve, gastric cardia, and fundus negative.  Stomach was decompressed.  The scope withdrawn into the proximal esophagus.  Brush cytology obtained to rule out Candida infection.  Scope withdrawn.  The patient tolerated the procedure without difficulty being maintained on Datascope monitor and low-flow oxygen throughout.  Returned to recovery following endoscopic procedure.  ASSESSMENT: 1. Esophageal exudate consistent with Candida esophagitis - brush cytology     pending. 2. Normal stomach and duodenum.  RECOMMENDATION: 1. Initiate Diflucan 200 mg p.o. loading dose followed by 100 mg daily for the    next nine days. 2. Follow up brush cytologies. DD:  07/22/00 TD:  07/22/00 Job: [id number]

## 2011-09-04 LAB — I-STAT 8, (EC8 V) (CONVERTED LAB)
BUN: 20
Chloride: 107
HCT: 46
Hemoglobin: 15.6 — ABNORMAL HIGH
Operator id: 128471
Potassium: 4.7
Sodium: 142

## 2011-09-04 LAB — BASIC METABOLIC PANEL
Chloride: 106
GFR calc non Af Amer: 48 — ABNORMAL LOW
Sodium: 141

## 2011-10-23 LAB — HEMOGLOBIN A1C: Hgb A1c MFr Bld: 7.3 % — AB (ref 4.0–6.0)

## 2011-10-23 LAB — TSH: TSH: 9.06 u[IU]/mL — AB (ref <=5.90)

## 2011-12-25 ENCOUNTER — Telehealth: Payer: Self-pay

## 2011-12-25 NOTE — Telephone Encounter (Signed)
.  [facility] PT IS requesting med refills  for thyroid meds and reflux - [facility] _ [address] [facility] CB # [phone]

## 2011-12-28 MED ORDER — LEVOTHYROXINE SODIUM 50 MCG PO TABS: 50.0000 ug | ORAL_TABLET | Freq: Every day | ORAL | Status: DC

## 2011-12-28 MED ORDER — OMEPRAZOLE 20 MG PO CPDR: 20.0000 mg | DELAYED_RELEASE_CAPSULE | Freq: Every day | ORAL | Status: DC

## 2011-12-28 NOTE — Telephone Encounter (Signed)
Spoke with pt who stated she has appt scheduled for March 5 for f/up - couldn't get one any sooner. Requests RF of synthroid and prilosec to last until appt.

## 2011-12-28 NOTE — Telephone Encounter (Signed)
Tried to call pt. Only one number on file, no answer, no VM. Pt is overdue for recheck on TSH, what is plan? Verify if pt is taking prilosec as written on 11/27 labs.

## 2011-12-28 NOTE — Telephone Encounter (Signed)
Rx refill for synthroid and omeprazole.

## 2011-12-29 NOTE — Telephone Encounter (Signed)
Pt notified meds were sent in

## 2012-01-17 ENCOUNTER — Encounter: Payer: Self-pay | Admitting: *Deleted

## 2012-01-17 DIAGNOSIS — C801 Malignant (primary) neoplasm, unspecified: Secondary | ICD-10-CM

## 2012-01-17 DIAGNOSIS — R7989 Other specified abnormal findings of blood chemistry: Secondary | ICD-10-CM

## 2012-01-17 DIAGNOSIS — I10 Essential (primary) hypertension: Secondary | ICD-10-CM | POA: Insufficient documentation

## 2012-01-17 DIAGNOSIS — E785 Hyperlipidemia, unspecified: Secondary | ICD-10-CM | POA: Insufficient documentation

## 2012-01-17 DIAGNOSIS — F419 Anxiety disorder, unspecified: Secondary | ICD-10-CM | POA: Insufficient documentation

## 2012-01-17 DIAGNOSIS — N289 Disorder of kidney and ureter, unspecified: Secondary | ICD-10-CM

## 2012-01-17 DIAGNOSIS — K219 Gastro-esophageal reflux disease without esophagitis: Secondary | ICD-10-CM | POA: Insufficient documentation

## 2012-01-17 DIAGNOSIS — E039 Hypothyroidism, unspecified: Secondary | ICD-10-CM | POA: Insufficient documentation

## 2012-01-20 ENCOUNTER — Other Ambulatory Visit: Payer: Self-pay | Admitting: Family Medicine

## 2012-01-20 MED ORDER — GLIPIZIDE ER 10 MG PO TB24: 10.0000 mg | ORAL_TABLET | Freq: Two times a day (BID) | ORAL | Status: DC

## 2012-01-29 ENCOUNTER — Ambulatory Visit: Payer: Medicare Other

## 2012-01-29 ENCOUNTER — Encounter: Payer: Self-pay | Admitting: Emergency Medicine

## 2012-01-29 ENCOUNTER — Ambulatory Visit (INDEPENDENT_AMBULATORY_CARE_PROVIDER_SITE_OTHER): Payer: Medicare Other | Admitting: Emergency Medicine

## 2012-01-29 DIAGNOSIS — I10 Essential (primary) hypertension: Secondary | ICD-10-CM

## 2012-01-29 DIAGNOSIS — E039 Hypothyroidism, unspecified: Secondary | ICD-10-CM

## 2012-01-29 DIAGNOSIS — R51 Headache: Secondary | ICD-10-CM

## 2012-01-29 DIAGNOSIS — E782 Mixed hyperlipidemia: Secondary | ICD-10-CM

## 2012-01-29 LAB — GLUCOSE, POCT (MANUAL RESULT ENTRY): POC Glucose: 116

## 2012-01-29 MED ORDER — ESOMEPRAZOLE MAGNESIUM 40 MG PO CPDR: 40.0000 mg | DELAYED_RELEASE_CAPSULE | Freq: Every day | ORAL | Status: DC

## 2012-01-29 NOTE — Progress Notes (Signed)
  Subjective:    Patient ID: Sherri Burns, female    DOB: 1936-09-04, 76 y.o.   MRN: [id number]  HPI    Review of Systems     Objective:   Physical Exam   [facility] reading (PRIMARY) by  Dr.Taijon Vink.      Assessment & Plan:

## 2012-01-29 NOTE — Progress Notes (Signed)
  Subjective:    Patient ID: Sherri Burns, female    DOB: 1936-02-07, 76 y.o.   MRN: [id number]  HPI patient enters for recheck. She is here to followup on hypertension and her diabetes. She denies any chest pain shortness or breath or any acute symptoms.    Review of Systems noncontributory as related to the present illness.     Objective:   Physical Exam H. EENT exam is within normal limits neck supple chest clear heart regular rate no murmurs rubs or gallops abdomen soft nontender        Assessment & Plan:  Hemoglobin A1c is still above 7 she needs to work on diet weight loss and exercise she associates these things and is stating she will start them and strength. Mild vessel was known to be hypothyroid we'll check TSH T4 this visit.

## 2012-02-19 ENCOUNTER — Other Ambulatory Visit: Payer: Self-pay | Admitting: *Deleted

## 2012-02-19 MED ORDER — GLIPIZIDE ER 10 MG PO TB24: 10.0000 mg | ORAL_TABLET | Freq: Two times a day (BID) | ORAL | Status: DC

## 2012-05-28 ENCOUNTER — Other Ambulatory Visit: Payer: Self-pay

## 2012-05-28 MED ORDER — CLONAZEPAM 1 MG PO TABS: 1.0000 mg | ORAL_TABLET | Freq: Two times a day (BID) | ORAL | Status: DC | PRN

## 2012-06-03 ENCOUNTER — Ambulatory Visit (INDEPENDENT_AMBULATORY_CARE_PROVIDER_SITE_OTHER): Payer: Medicare Other | Admitting: Emergency Medicine

## 2012-06-03 ENCOUNTER — Encounter: Payer: Self-pay | Admitting: Emergency Medicine

## 2012-06-03 VITALS — BP 150/94 | HR 67 | Temp 97.6°F | Resp 16 | Ht 65.0 in | Wt 190.0 lb

## 2012-06-03 DIAGNOSIS — I10 Essential (primary) hypertension: Secondary | ICD-10-CM

## 2012-06-03 DIAGNOSIS — R21 Rash and other nonspecific skin eruption: Secondary | ICD-10-CM

## 2012-06-03 DIAGNOSIS — E119 Type 2 diabetes mellitus without complications: Secondary | ICD-10-CM

## 2012-06-03 LAB — GLUCOSE, POCT (MANUAL RESULT ENTRY): POC Glucose: 111 mg/dl — AB (ref 70–99)

## 2012-06-03 MED ORDER — BETAMETHASONE DIPROPIONATE 0.05 % EX CREA: TOPICAL_CREAM | Freq: Two times a day (BID) | CUTANEOUS | Status: DC

## 2012-06-03 NOTE — Progress Notes (Signed)
  Subjective:    Patient ID: Sherri Burns, female    DOB: 1936-07-01, 76 y.o.   MRN: [id number]  HPI patient here to follow up diabetes and high blood pressure. She has been under a great deal of stress. She has denied that she has been eating inappropriately. She has not any chest pain or shortness of breath the    Review of Systems     Objective:   Physical Exam  Constitutional: She appears well-developed and well-nourished.  HENT:  Head: Normocephalic and atraumatic.  Eyes: Pupils are equal, round, and reactive to light.  Neck: No thyromegaly present.  Cardiovascular: Normal rate and regular rhythm.   Pulmonary/Chest: No respiratory distress. She has no wheezes.     Results for orders placed in visit on 01/29/12  GLUCOSE, POCT (MANUAL RESULT ENTRY)      Component Value Range   POC Glucose 116    POCT GLYCOSYLATED HEMOGLOBIN (HGB A1C)      Component Value Range   Hemoglobin A1C 7.2    TSH      Component Value Range   TSH 3.389  0.350 - 4.500 uIU/mL  T4, FREE      Component Value Range   Free T4 1.23  0.80 - 1.80 ng/dL   Results for orders placed in visit on 06/03/12  GLUCOSE, POCT (MANUAL RESULT ENTRY)      Component Value Range   POC Glucose 111 (*) 70 - 99 mg/dl  POCT GLYCOSYLATED HEMOGLOBIN (HGB A1C)      Component Value Range   Hemoglobin A1C 7.1    POCT SKIN KOH      Component Value Range   Skin KOH, POC Negative     As a dry scaly rash on the palm of her hand which is KOH    Assessment & Plan:  I told her she really needs to work on trying to lose weight. She did not tolerate Glucophage. If we need to go to another medication it will probably need to be Januvia. I did not increase her blood pressure medications at the present time but advised her it is very important that she exercise and work on trying to lose weight. I did give her some Diprolene to try and use on the rash on her palm.

## 2012-06-17 ENCOUNTER — Telehealth: Payer: Self-pay | Admitting: Radiology

## 2012-06-17 MED ORDER — GLIPIZIDE ER 10 MG PO TB24: 10.0000 mg | ORAL_TABLET | Freq: Two times a day (BID) | ORAL | Status: DC

## 2012-06-17 NOTE — Telephone Encounter (Signed)
Patient had Klonopin filled 7/3 so should not be due for a refill yet. Glipizide refilled (may need to call it it because I sent electronically)

## 2012-06-17 NOTE — Telephone Encounter (Signed)
Sherri Burns from [facility] called because their electronic requests are not working right now.  Patient requests: klonopin 1mg  (generic) 1 po bid #90 AND Glipizide XL 1 bid #60.  Send to [facility].

## 2012-06-18 ENCOUNTER — Telehealth: Payer: Self-pay | Admitting: Emergency Medicine

## 2012-06-18 MED ORDER — CLONAZEPAM 1 MG PO TABS: 1.0000 mg | ORAL_TABLET | Freq: Two times a day (BID) | ORAL | Status: DC | PRN

## 2012-06-18 MED ORDER — LEVOTHYROXINE SODIUM 50 MCG PO TABS: 50.0000 ug | ORAL_TABLET | Freq: Every day | ORAL | Status: DC

## 2012-06-18 NOTE — Telephone Encounter (Signed)
Pharmacist reports that they did receive the glipizide but the Rx for clonazepam from 05/28/12 was only for #30 (which is what our records show in [facility]). Can we RF it?

## 2012-06-18 NOTE — Telephone Encounter (Signed)
Please call in 2 medications . Patient is on Klonopin 1 mg twice a day #60 with 3 refills and glipizide XL 10 twice a day #60 with refills x1 year

## 2012-07-01 ENCOUNTER — Encounter: Payer: Self-pay | Admitting: Emergency Medicine

## 2012-07-15 ENCOUNTER — Ambulatory Visit (INDEPENDENT_AMBULATORY_CARE_PROVIDER_SITE_OTHER): Payer: Medicare Other | Admitting: Family Medicine

## 2012-07-15 VITALS — BP 136/102 | HR 62 | Temp 98.2°F | Resp 16 | Ht 65.5 in | Wt 187.0 lb

## 2012-07-15 DIAGNOSIS — R079 Chest pain, unspecified: Secondary | ICD-10-CM

## 2012-07-15 DIAGNOSIS — F411 Generalized anxiety disorder: Secondary | ICD-10-CM

## 2012-07-15 DIAGNOSIS — F419 Anxiety disorder, unspecified: Secondary | ICD-10-CM

## 2012-07-15 DIAGNOSIS — E119 Type 2 diabetes mellitus without complications: Secondary | ICD-10-CM

## 2012-07-15 DIAGNOSIS — I10 Essential (primary) hypertension: Secondary | ICD-10-CM

## 2012-07-15 DIAGNOSIS — E039 Hypothyroidism, unspecified: Secondary | ICD-10-CM

## 2012-07-15 DIAGNOSIS — R61 Generalized hyperhidrosis: Secondary | ICD-10-CM

## 2012-07-15 DIAGNOSIS — F064 Anxiety disorder due to known physiological condition: Secondary | ICD-10-CM

## 2012-07-15 DIAGNOSIS — G479 Sleep disorder, unspecified: Secondary | ICD-10-CM

## 2012-07-15 LAB — GLUCOSE, POCT (MANUAL RESULT ENTRY): POC Glucose: 112 mg/dl — AB (ref 70–99)

## 2012-07-15 LAB — POCT CBC
Lymph, poc: 2.8 (ref 0.6–3.4)
MCH, POC: 30.6 pg (ref 27–31.2)
MCHC: 31.1 g/dL — AB (ref 31.8–35.4)
MID (cbc): 0.6 (ref 0–0.9)
MPV: 9.1 fL (ref 0–99.8)
POC Granulocyte: 3.7 (ref 2–6.9)
POC MID %: 8.5 %M (ref 0–12)
Platelet Count, POC: 229 10*3/uL (ref 142–424)
RBC: 4.38 M/uL (ref 4.04–5.48)
WBC: 7.1 10*3/uL (ref 4.6–10.2)

## 2012-07-15 MED ORDER — TRAZODONE HCL 50 MG PO TABS: 25.0000 mg | ORAL_TABLET | Freq: Every evening | ORAL | Status: DC | PRN

## 2012-07-15 MED ORDER — CLONAZEPAM 1 MG PO TABS: 1.0000 mg | ORAL_TABLET | Freq: Two times a day (BID) | ORAL | Status: DC | PRN

## 2012-07-15 NOTE — Patient Instructions (Addendum)
Stop the caffeine after lunch.  Taper off of the cigarettes  Take trazodone 50 mg one before bedtime.     Try to take a walk every day.  Follow up with Dr Cleta Alberts in the near future.

## 2012-07-15 NOTE — Progress Notes (Signed)
Subjective: 76 year old patient of Dr. Deforest Hoyles. He called me this morning and asked me to see the patient for him. She has been having problems with sleep waking up sweating at night. She soaks her closed. She has some episodes the day time but much worse in the night doesn't feel good. She was having these symptoms even before she was started on thyroid not long ago. Going on for [place] about 4 months. She also recently had some diverticulitis, and has sensitivity of her stomach. She's been having some headaches, especially when badly when she was riding with her husband recently. She gets some swimmy headedness. There is one episode of right chest pain. No nausea or vomiting but doesn't have good appetite.  Objective: Alert oriented lady in no major distress. TMs normal. Eyes PERRLA. Throat clear. Neck supple without nodes or thyromegaly. No carotid bruits. Chest is clear to auscultation. Heart regular without murmurs abdomen was soft without masses. She is tender in the epigastrium. She is also tender in the low abdomen where she has had diverticulitis.  Assessment Diaphoresis Diabetes History of hypothyroidism Headaches GERD Diverticulosis Chest pain  Check some labs and proceed from there. Results for orders placed in visit on 07/15/12  POCT CBC      Component Value Range   WBC 7.1  4.6 - 10.2 K/uL   Lymph, poc 2.8  0.6 - 3.4   POC LYMPH PERCENT 39.4  10 - 50 %L   MID (cbc) 0.6  0 - 0.9   POC MID % 8.5  0 - 12 %M   POC Granulocyte 3.7  2 - 6.9   Granulocyte percent 52.1  37 - 80 %G   RBC 4.38  4.04 - 5.48 M/uL   Hemoglobin 13.4  12.2 - 16.2 g/dL   HCT, POC 16.1  09.6 - 47.9 %   MCV 98.5 (*) 80 - 97 fL   MCH, POC 30.6  27 - 31.2 pg   MCHC 31.1 (*) 31.8 - 35.4 g/dL   RDW, POC 04.5     Platelet Count, POC 229  142 - 424 K/uL   MPV 9.1  0 - 99.8 fL  GLUCOSE, POCT (MANUAL RESULT ENTRY)      Component Value Range   POC Glucose 112 (*) 70 - 99 mg/dl   Orders placed in visit on 07/15/12   . EKG 12-LEAD   Ekg had nonspecific inferior t wave inversions.similar old tracing. I explained to her that anxiety he calls the sweating and poor sleep. It turns out she's been spending all her time worrying about her grandchildren. Also about the death of several relatives. She does not get any exercise. Her edges says he just takes a drink before bedtime and sleeps well he does find that she worries about everything.  We will check the labs when they come in. I did give her some trazodone to take 50 mg at bedtime. She might need an increased dose of that to get more antidepressants/anti- anxiety effect. Advised to see Dr. Deforest Hoyles back in the fairly near future in the fairly near future.

## 2012-07-15 NOTE — Assessment & Plan Note (Signed)
She's also tender in the low abdomen where she has her diverticulitis problems.

## 2012-07-16 LAB — COMPREHENSIVE METABOLIC PANEL
ALT: 18 U/L (ref 0–35)
AST: 16 U/L (ref 0–37)
Alkaline Phosphatase: 69 U/L (ref 39–117)
BUN: 16 mg/dL (ref 6–23)
Creat: 1.22 mg/dL — ABNORMAL HIGH (ref 0.50–1.10)

## 2012-07-16 LAB — THYROID PANEL WITH TSH
Free Thyroxine Index: 2.6 (ref 1.0–3.9)
TSH: 2.187 u[IU]/mL (ref 0.350–4.500)

## 2012-07-18 ENCOUNTER — Encounter: Payer: Self-pay | Admitting: Family Medicine

## 2012-08-06 ENCOUNTER — Encounter: Payer: Self-pay | Admitting: Emergency Medicine

## 2012-08-14 ENCOUNTER — Other Ambulatory Visit: Payer: Self-pay

## 2012-08-14 MED ORDER — CLONAZEPAM 1 MG PO TABS: 1.0000 mg | ORAL_TABLET | Freq: Two times a day (BID) | ORAL | Status: DC | PRN

## 2012-08-23 ENCOUNTER — Other Ambulatory Visit: Payer: Self-pay | Admitting: *Deleted

## 2012-08-23 MED ORDER — GLIPIZIDE ER 10 MG PO TB24: 10.0000 mg | ORAL_TABLET | Freq: Two times a day (BID) | ORAL | Status: DC

## 2012-09-04 ENCOUNTER — Other Ambulatory Visit: Payer: Self-pay

## 2012-09-04 MED ORDER — CLONAZEPAM 1 MG PO TABS: 1.0000 mg | ORAL_TABLET | Freq: Two times a day (BID) | ORAL | Status: DC | PRN

## 2012-09-09 ENCOUNTER — Telehealth: Payer: Self-pay | Admitting: Radiology

## 2012-09-09 NOTE — Telephone Encounter (Signed)
I spoke to patient, I got order from company in [facility] for her diabetic shoes, I advised her it will be better to get shoes locally since she only gets one pair per year. She should get these locally so she can be measured. She has appt with you on 09/23/12 I advised her [employer] is a good place to get these, she wants to get the order when she is here. Amy

## 2012-09-23 ENCOUNTER — Encounter: Payer: Self-pay | Admitting: Emergency Medicine

## 2012-09-23 ENCOUNTER — Ambulatory Visit (INDEPENDENT_AMBULATORY_CARE_PROVIDER_SITE_OTHER): Payer: Medicare Other | Admitting: Emergency Medicine

## 2012-09-23 VITALS — BP 138/86 | HR 61 | Temp 98.7°F | Resp 16 | Ht 65.25 in | Wt 186.0 lb

## 2012-09-23 DIAGNOSIS — R21 Rash and other nonspecific skin eruption: Secondary | ICD-10-CM

## 2012-09-23 DIAGNOSIS — Z23 Encounter for immunization: Secondary | ICD-10-CM

## 2012-09-23 DIAGNOSIS — F439 Reaction to severe stress, unspecified: Secondary | ICD-10-CM

## 2012-09-23 DIAGNOSIS — E782 Mixed hyperlipidemia: Secondary | ICD-10-CM

## 2012-09-23 DIAGNOSIS — E1142 Type 2 diabetes mellitus with diabetic polyneuropathy: Secondary | ICD-10-CM | POA: Insufficient documentation

## 2012-09-23 DIAGNOSIS — I10 Essential (primary) hypertension: Secondary | ICD-10-CM

## 2012-09-23 DIAGNOSIS — E756 Lipid storage disorder, unspecified: Secondary | ICD-10-CM

## 2012-09-23 LAB — COMPREHENSIVE METABOLIC PANEL
Alkaline Phosphatase: 75 U/L (ref 39–117)
BUN: 11 mg/dL (ref 6–23)
CO2: 31 mEq/L (ref 19–32)
Glucose, Bld: 138 mg/dL — ABNORMAL HIGH (ref 70–99)
Total Bilirubin: 0.5 mg/dL (ref 0.3–1.2)

## 2012-09-23 LAB — TSH: TSH: 6.182 u[IU]/mL — ABNORMAL HIGH (ref 0.350–4.500)

## 2012-09-23 LAB — POCT SKIN KOH: Skin KOH, POC: POSITIVE

## 2012-09-23 MED ORDER — CLONAZEPAM 1 MG PO TABS: 1.0000 mg | ORAL_TABLET | Freq: Two times a day (BID) | ORAL | Status: DC | PRN

## 2012-09-23 MED ORDER — KETOCONAZOLE 2 % EX CREA: TOPICAL_CREAM | Freq: Every day | CUTANEOUS | Status: DC

## 2012-09-23 MED ORDER — METOPROLOL TARTRATE 100 MG PO TABS: 100.0000 mg | ORAL_TABLET | Freq: Two times a day (BID) | ORAL | Status: DC

## 2012-09-23 NOTE — Progress Notes (Signed)
  Subjective:    Patient ID: Sherri Burns, female    DOB: 01-25-1936, 75 y.o.   MRN: [id number]  HPI Sherri Burns enters today for followup of high blood pressure, high cholesterol, and diabetes. Patient as well as stress at home with a grandchild who is currently living with her. She takes her Klonopin on an as-needed basis but does not take it regularly. She continues to be bothered with a pruritic rash on the palmar surfaces of her hands which has not responded to cream she has used.    Review of Systems     Objective:   Physical Exam patient is alert cooperative blood pressure is under good control for her at 138/86 her chest was clear cardiac exam is unremarkable. Extremities reveals no edema no skin breakdown on her feet examination the hands reveals a scaly rash present over the hyperthenar area of both palms.  Results for orders placed in visit on 09/23/12  GLUCOSE, POCT (MANUAL RESULT ENTRY)      Component Value Range   POC Glucose 140 (*) 70 - 99 mg/dl  POCT GLYCOSYLATED HEMOGLOBIN (HGB A1C)      Component Value Range   Hemoglobin A1C 6.9    POCT SKIN KOH      Component Value Range   Skin KOH, POC Positive          Assessment & Plan:  KOH prep today did show fungus. Her A1c is 6.9 and she is encouraged to continue to work on weight loss. Weight loss would be much more important than changing her medications at the present time. Patient states she currently off her thyroid medication needs to see what her thyroid levels are.

## 2012-10-12 ENCOUNTER — Ambulatory Visit (INDEPENDENT_AMBULATORY_CARE_PROVIDER_SITE_OTHER): Payer: Medicare Other | Admitting: Internal Medicine

## 2012-10-12 VITALS — BP 143/85 | HR 66 | Temp 98.2°F | Resp 16 | Ht 65.7 in | Wt 187.0 lb

## 2012-10-12 DIAGNOSIS — B029 Zoster without complications: Secondary | ICD-10-CM

## 2012-10-12 MED ORDER — TRAMADOL HCL 50 MG PO TABS: 100.0000 mg | ORAL_TABLET | Freq: Three times a day (TID) | ORAL | Status: DC | PRN

## 2012-10-12 MED ORDER — VALACYCLOVIR HCL 1 G PO TABS: 1000.0000 mg | ORAL_TABLET | Freq: Two times a day (BID) | ORAL | Status: DC

## 2012-10-12 NOTE — Progress Notes (Addendum)
Subjective:    Patient ID: Sherri Burns, female    DOB: 1936-02-02, 76 y.o.   MRN: [id number]  HPI Severe pain L flank for 3 days//new red bumps there today  Patient Active Problem List  Diagnosis  . Hypertension  . GERD (gastroesophageal reflux disease)  . Hyperlipidemia  . Anxiety  . Cancer  . Elevated TSH  . Renal insufficiency  . Diabetes mellitus   Prior to Admission medications   Medication Sig Start Date End Date Taking? Authorizing Provider  aspirin 81 MG tablet Take 81 mg by mouth daily.   Yes Historical Provider, MD  Cholecalciferol (VITAMIN D-3 PO) Take 1 tablet by mouth daily.   Yes Historical Provider, MD  clonazePAM (KLONOPIN) 1 MG tablet Take 1 tablet (1 mg total) by mouth 2 (two) times daily as needed. 09/23/12  Yes Collene Gobble, MD  diltiazem (CARDIZEM CD) 240 MG 24 hr capsule Take 240 mg by mouth daily.   Yes Historical Provider, MD  esomeprazole (NEXIUM) 40 MG capsule Take 40 mg by mouth daily before breakfast.   Yes Historical Provider, MD  esomeprazole (NEXIUM) 40 MG capsule Take 1 capsule (40 mg total) by mouth daily. 01/29/12 01/28/13 Yes Collene Gobble, MD  fexofenadine (ALLEGRA) 180 MG tablet Take 180 mg by mouth daily.   Yes Historical Provider, MD  furosemide (LASIX) 40 MG tablet Take 40 mg by mouth daily.   Yes Historical Provider, MD  glipiZIDE (GLUCOTROL XL) 10 MG 24 hr tablet Take 1 tablet (10 mg total) by mouth 2 (two) times daily. 08/23/12 08/23/13 Yes Ryan M Dunn, PA-C  ketoconazole (NIZORAL) 2 % cream Apply topically daily. Apply to rash twice a day. 09/23/12  Yes Collene Gobble, MD  metoprolol (LOPRESSOR) 100 MG tablet Take 1 tablet (100 mg total) by mouth 2 (two) times daily. 09/23/12  Yes Collene Gobble, MD  rosuvastatin (CRESTOR) 5 MG tablet Take 5 mg by mouth daily.   Yes Historical Provider, MD  Vitamin E 100 UNITS TABS Take by mouth.   Yes Historical Provider, MD  betamethasone dipropionate (DIPROLENE) 0.05 % cream Apply topically 2 (two) times  daily. 06/03/12 06/03/13  Collene Gobble, MD  fluticasone (VERAMYST) 27.5 MCG/SPRAY nasal spray Place 2 sprays into the nose daily.    Historical Provider, MD  levothyroxine (SYNTHROID, LEVOTHROID) 50 MCG tablet Take 1 tablet (50 mcg total) by mouth daily. 06/18/12 06/18/13  Morrell Riddle, PA-C  NON FORMULARY Hormone patch    Historical Provider, MD  omeprazole (PRILOSEC) 20 MG capsule Take 1 capsule (20 mg total) by mouth daily. 12/28/11 12/27/12  Rickard Patience, PA-C  simvastatin (ZOCOR) 40 MG tablet Take 40 mg by mouth every evening.    Historical Provider, MD  traMADol (ULTRAM) 50 MG tablet Take 2 tablets (100 mg total) by mouth every 8 (eight) hours as needed for pain. 10/12/12   Tonye Pearson, MD  traZODone (DESYREL) 50 MG tablet Take 0.5-1 tablets (25-50 mg total) by mouth at bedtime as needed for sleep. 07/15/12 08/14/12  Peyton Najjar, MD  valACYclovir (VALTREX) 1000 MG tablet Take 1 tablet (1,000 mg total) by mouth 2 (two) times daily. 10/12/12   Tonye Pearson, MD      Review of Systems No fever, cough, palpitations    Objective:   Physical Exam Filed Vitals:   10/12/12 0857  BP: 143/85  Pulse: 66  Temp: 98.2 F (36.8 C)  Resp: 16   L Flank/mid axuillary line  with cluster of vesicles on red base 4+ tender to light touch in dermatome       Assessment & Plan:  1/ zoster Meds ordered this encounter  Medications  . valACYclovir (VALTREX) 1000 MG tablet    Sig: Take 1 tablet (1,000 mg total) by mouth 2 (two) times daily.(not TID due to renal insufficiency)    Dispense:  20 tablet    Refill:  0  . traMADol (ULTRAM) 50 MG tablet    Sig: Take 2 tablets (100 mg total) by mouth every 8 (eight) hours as needed for pain.    Dispense:  30 tablet    Refill:  1   F/u 2 wks if not painfree

## 2012-11-01 ENCOUNTER — Telehealth: Payer: Self-pay

## 2012-11-01 ENCOUNTER — Other Ambulatory Visit: Payer: Self-pay | Admitting: Internal Medicine

## 2012-11-01 ENCOUNTER — Other Ambulatory Visit: Payer: Self-pay | Admitting: Physician Assistant

## 2012-11-01 DIAGNOSIS — G479 Sleep disorder, unspecified: Secondary | ICD-10-CM

## 2012-11-01 DIAGNOSIS — F419 Anxiety disorder, unspecified: Secondary | ICD-10-CM

## 2012-11-01 MED ORDER — DILTIAZEM HCL ER COATED BEADS 240 MG PO CP24: 240.0000 mg | ORAL_CAPSULE | Freq: Every day | ORAL | Status: DC

## 2012-11-01 MED ORDER — FUROSEMIDE 40 MG PO TABS: 40.0000 mg | ORAL_TABLET | Freq: Every day | ORAL | Status: DC

## 2012-11-01 MED ORDER — TRAZODONE HCL 50 MG PO TABS: 25.0000 mg | ORAL_TABLET | Freq: Every evening | ORAL | Status: DC | PRN

## 2012-11-01 NOTE — Telephone Encounter (Signed)
PATIENT STATES HER PHARMACY HAS TRIED TWICE TO GET HER REFILLS FOR HER BLOOD PRESSURE & PAIN MEDICATION FOR HER SHINGLES AND HEADACHES AND THEY HAVE NOT HEARD BACK FROM [place]. SHE WOULD LIKE TO GET A CALL BACK AS SOON AS POSSIBLE PLEASE. BEST PHONE [phone]   PHARMACY CHOICE IS [facility] ON [address].   MBC

## 2012-11-01 NOTE — Telephone Encounter (Signed)
Done already. Pharmacy called to get a verbal over the phone

## 2012-11-10 ENCOUNTER — Other Ambulatory Visit: Payer: Self-pay | Admitting: Radiology

## 2012-11-24 ENCOUNTER — Telehealth: Payer: Self-pay | Admitting: *Deleted

## 2012-11-24 NOTE — Telephone Encounter (Signed)
[facility] faxed over a form about adding a ACE inhibitor or ARB for pt since she has diabetes.  Form is at nurses station in phone message pile.

## 2012-11-24 NOTE — Telephone Encounter (Signed)
Dr. Cleta Alberts - FYI only, [facility] asking to add an ACE to this patient's regimen as she has DM.  I have not ordered this as I'm not familiar with the patient, and she is on other antihypertensives.  But if you think this is appropriate, we can send this in or can discuss with pt at next visit

## 2012-11-25 NOTE — Telephone Encounter (Signed)
Thanks, I have called patient to advise, she is aware. Patient states she does think she needs a refill on one of her meds, but unsure which. She will call back and let me know.

## 2012-11-25 NOTE — Telephone Encounter (Signed)
Please notify [facility] that I will discuss this with Claris Che at her next office visit

## 2012-11-26 ENCOUNTER — Other Ambulatory Visit: Payer: Self-pay | Admitting: Radiology

## 2012-12-01 ENCOUNTER — Other Ambulatory Visit: Payer: Self-pay | Admitting: Physician Assistant

## 2012-12-23 ENCOUNTER — Ambulatory Visit (INDEPENDENT_AMBULATORY_CARE_PROVIDER_SITE_OTHER): Payer: Medicare Other | Admitting: Emergency Medicine

## 2012-12-23 ENCOUNTER — Encounter: Payer: Self-pay | Admitting: Emergency Medicine

## 2012-12-23 VITALS — BP 132/83 | HR 67 | Temp 97.8°F | Resp 16 | Ht 65.4 in | Wt 184.0 lb

## 2012-12-23 DIAGNOSIS — B0229 Other postherpetic nervous system involvement: Secondary | ICD-10-CM

## 2012-12-23 DIAGNOSIS — E119 Type 2 diabetes mellitus without complications: Secondary | ICD-10-CM

## 2012-12-23 DIAGNOSIS — I10 Essential (primary) hypertension: Secondary | ICD-10-CM

## 2012-12-23 DIAGNOSIS — E039 Hypothyroidism, unspecified: Secondary | ICD-10-CM

## 2012-12-23 LAB — COMPREHENSIVE METABOLIC PANEL
ALT: 17 U/L (ref 0–35)
Albumin: 4.4 g/dL (ref 3.5–5.2)
Alkaline Phosphatase: 70 U/L (ref 39–117)
CO2: 28 mEq/L (ref 19–32)
Glucose, Bld: 130 mg/dL — ABNORMAL HIGH (ref 70–99)
Potassium: 4.6 mEq/L (ref 3.5–5.3)
Sodium: 143 mEq/L (ref 135–145)
Total Bilirubin: 0.5 mg/dL (ref 0.3–1.2)
Total Protein: 7 g/dL (ref 6.0–8.3)

## 2012-12-23 LAB — T4, FREE: Free T4: 1.44 ng/dL (ref 0.80–1.80)

## 2012-12-23 LAB — POCT GLYCOSYLATED HEMOGLOBIN (HGB A1C): Hemoglobin A1C: 6.5

## 2012-12-23 MED ORDER — LIDOCAINE 5 % EX PTCH: 1.0000 | MEDICATED_PATCH | CUTANEOUS | Status: DC

## 2012-12-23 NOTE — Addendum Note (Signed)
Addended by: Lesle Chris A on: 12/23/2012 01:16 PM   Modules accepted: Orders

## 2012-12-23 NOTE — Progress Notes (Addendum)
  Subjective:    Patient ID: Sherri Burns, female    DOB: 1936/01/26, 77 y.o.   MRN: [id number]  HPI patient in for followup of her hypertension hyperlipidemia and diabetes. She's been taking her education and regular. She's not been checking her sugars regularly. Been under good deal of stress recently trying to help out her grandchildren financially. She's having pain in the site where she previously had shingles. She's also asking about shingles vaccine     Review of Systems     Objective:   Physical Exam patient is alert and cooperative not in distress. Her neck is supple chest clear heart regular rate no murmurs extremity exam reveals no edema.  Results for orders placed in visit on 12/23/12  GLUCOSE, POCT (MANUAL RESULT ENTRY)      Component Value Range   POC Glucose 126 (*) 70 - 99 mg/dl  POCT GLYCOSYLATED HEMOGLOBIN (HGB A1C)      Component Value Range   Hemoglobin A1C 6.5          Assessment & Plan:     Patient is at goal. We'll try some Lidoderm patches to help with her  postherpetic neuralgia.

## 2012-12-24 ENCOUNTER — Other Ambulatory Visit: Payer: Self-pay | Admitting: Emergency Medicine

## 2012-12-28 ENCOUNTER — Telehealth: Payer: Self-pay | Admitting: *Deleted

## 2012-12-28 NOTE — Telephone Encounter (Signed)
Form faxed to [facility] for diabetes supplies

## 2012-12-30 ENCOUNTER — Other Ambulatory Visit: Payer: Self-pay | Admitting: Physician Assistant

## 2013-01-28 ENCOUNTER — Telehealth: Payer: Self-pay | Admitting: Radiology

## 2013-01-28 NOTE — Telephone Encounter (Signed)
Please sign form for patients diabetes supplies. It is in my box

## 2013-03-03 ENCOUNTER — Other Ambulatory Visit: Payer: Self-pay | Admitting: Physician Assistant

## 2013-03-11 ENCOUNTER — Other Ambulatory Visit: Payer: Self-pay | Admitting: Physician Assistant

## 2013-04-06 ENCOUNTER — Other Ambulatory Visit: Payer: Self-pay | Admitting: Physician Assistant

## 2013-04-07 ENCOUNTER — Telehealth: Payer: Self-pay

## 2013-04-07 DIAGNOSIS — F439 Reaction to severe stress, unspecified: Secondary | ICD-10-CM

## 2013-04-07 NOTE — Telephone Encounter (Signed)
Pharm requests RF of clonazepam 1 mg. 

## 2013-04-07 NOTE — Telephone Encounter (Signed)
It is okay to refill Klonopin 1 mg. She gets #60 tablets. She can have 5 refills.

## 2013-04-08 MED ORDER — CLONAZEPAM 1 MG PO TABS: 1.0000 mg | ORAL_TABLET | Freq: Two times a day (BID) | ORAL | Status: DC | PRN

## 2013-04-08 NOTE — Telephone Encounter (Signed)
Called in RF 

## 2013-04-21 ENCOUNTER — Ambulatory Visit (INDEPENDENT_AMBULATORY_CARE_PROVIDER_SITE_OTHER): Payer: Medicare Other | Admitting: Emergency Medicine

## 2013-04-21 VITALS — BP 108/72 | HR 57 | Temp 96.9°F | Resp 16 | Ht 65.5 in | Wt 188.0 lb

## 2013-04-21 DIAGNOSIS — E119 Type 2 diabetes mellitus without complications: Secondary | ICD-10-CM

## 2013-04-21 DIAGNOSIS — R7989 Other specified abnormal findings of blood chemistry: Secondary | ICD-10-CM

## 2013-04-21 DIAGNOSIS — R635 Abnormal weight gain: Secondary | ICD-10-CM

## 2013-04-21 DIAGNOSIS — I10 Essential (primary) hypertension: Secondary | ICD-10-CM

## 2013-04-21 LAB — BASIC METABOLIC PANEL
Chloride: 106 mEq/L (ref 96–112)
Creat: 1.46 mg/dL — ABNORMAL HIGH (ref 0.50–1.10)

## 2013-04-21 LAB — POCT GLYCOSYLATED HEMOGLOBIN (HGB A1C): Hemoglobin A1C: 7.2

## 2013-04-21 LAB — GLUCOSE, POCT (MANUAL RESULT ENTRY): POC Glucose: 141 mg/dl — AB (ref 70–99)

## 2013-04-21 NOTE — Progress Notes (Signed)
  Subjective:    Patient ID: Sherri Burns, female    DOB: 1936/01/09, 77 y.o.   MRN: [id number]  HPI patient doing well except she is eating more than she should. She is taking all her medications as instructed. She is under a great deal of stress at home   Review of Systems     Objective:   Physical Exam chest was clear heart regular rate no murmurs.    Results for orders placed in visit on 04/21/13  GLUCOSE, POCT (MANUAL RESULT ENTRY)      Result Value Range   POC Glucose 141 (*) 70 - 99 mg/dl  POCT GLYCOSYLATED HEMOGLOBIN (HGB A1C)      Result Value Range   Hemoglobin A1C 7.2        Assessment & Plan:  Patient's hemoglobin A1c has risen from 6.5 to a level today of 7.2. Patient was instructed she needs to work harder on her diet and weight loss and exercise. Recheck in 3-4 months .

## 2013-04-28 ENCOUNTER — Encounter (HOSPITAL_COMMUNITY): Payer: Self-pay | Admitting: *Deleted

## 2013-04-28 ENCOUNTER — Emergency Department (HOSPITAL_COMMUNITY)
Admission: EM | Admit: 2013-04-28 | Discharge: 2013-04-29 | Disposition: A | Discharge status: Home / Self Care | Payer: Medicare Other | Attending: Emergency Medicine | Admitting: Emergency Medicine

## 2013-04-28 ENCOUNTER — Telehealth: Payer: Self-pay | Admitting: Radiology

## 2013-04-28 DIAGNOSIS — F17200 Nicotine dependence, unspecified, uncomplicated: Secondary | ICD-10-CM | POA: Insufficient documentation

## 2013-04-28 DIAGNOSIS — Z905 Acquired absence of kidney: Secondary | ICD-10-CM | POA: Insufficient documentation

## 2013-04-28 DIAGNOSIS — Z7982 Long term (current) use of aspirin: Secondary | ICD-10-CM | POA: Insufficient documentation

## 2013-04-28 DIAGNOSIS — M545 Low back pain, unspecified: Secondary | ICD-10-CM | POA: Insufficient documentation

## 2013-04-28 DIAGNOSIS — E119 Type 2 diabetes mellitus without complications: Secondary | ICD-10-CM | POA: Insufficient documentation

## 2013-04-28 DIAGNOSIS — Z87448 Personal history of other diseases of urinary system: Secondary | ICD-10-CM | POA: Insufficient documentation

## 2013-04-28 DIAGNOSIS — Z880 Allergy status to penicillin: Secondary | ICD-10-CM | POA: Insufficient documentation

## 2013-04-28 DIAGNOSIS — R51 Headache: Secondary | ICD-10-CM | POA: Insufficient documentation

## 2013-04-28 DIAGNOSIS — I10 Essential (primary) hypertension: Secondary | ICD-10-CM | POA: Insufficient documentation

## 2013-04-28 DIAGNOSIS — Z85528 Personal history of other malignant neoplasm of kidney: Secondary | ICD-10-CM | POA: Insufficient documentation

## 2013-04-28 DIAGNOSIS — R42 Dizziness and giddiness: Secondary | ICD-10-CM | POA: Insufficient documentation

## 2013-04-28 DIAGNOSIS — E785 Hyperlipidemia, unspecified: Secondary | ICD-10-CM | POA: Insufficient documentation

## 2013-04-28 DIAGNOSIS — F411 Generalized anxiety disorder: Secondary | ICD-10-CM | POA: Insufficient documentation

## 2013-04-28 DIAGNOSIS — Z8619 Personal history of other infectious and parasitic diseases: Secondary | ICD-10-CM | POA: Insufficient documentation

## 2013-04-28 DIAGNOSIS — IMO0002 Reserved for concepts with insufficient information to code with codable children: Secondary | ICD-10-CM | POA: Insufficient documentation

## 2013-04-28 DIAGNOSIS — Z8719 Personal history of other diseases of the digestive system: Secondary | ICD-10-CM | POA: Insufficient documentation

## 2013-04-28 DIAGNOSIS — Z860100 Personal history of colon polyps, unspecified: Secondary | ICD-10-CM | POA: Insufficient documentation

## 2013-04-28 DIAGNOSIS — Z79899 Other long term (current) drug therapy: Secondary | ICD-10-CM | POA: Insufficient documentation

## 2013-04-28 DIAGNOSIS — R300 Dysuria: Secondary | ICD-10-CM | POA: Insufficient documentation

## 2013-04-28 DIAGNOSIS — K219 Gastro-esophageal reflux disease without esophagitis: Secondary | ICD-10-CM | POA: Insufficient documentation

## 2013-04-28 LAB — COMPREHENSIVE METABOLIC PANEL
ALT: 22 U/L (ref 0–35)
AST: 25 U/L (ref 0–37)
Albumin: 4.1 g/dL (ref 3.5–5.2)
Alkaline Phosphatase: 74 U/L (ref 39–117)
BUN: 17 mg/dL (ref 6–23)
CO2: 27 mEq/L (ref 19–32)
Calcium: 9.8 mg/dL (ref 8.4–10.5)
Chloride: 100 mEq/L (ref 96–112)
Creatinine, Ser: 1.27 mg/dL — ABNORMAL HIGH (ref 0.50–1.10)
GFR calc Af Amer: 47 mL/min — ABNORMAL LOW (ref >90)
GFR calc non Af Amer: 41 mL/min — ABNORMAL LOW (ref >90)
Glucose, Bld: 102 mg/dL — ABNORMAL HIGH (ref 70–99)
Potassium: 4.3 mEq/L (ref 3.5–5.1)
Sodium: 138 mEq/L (ref 135–145)
Total Bilirubin: 0.5 mg/dL (ref 0.3–1.2)
Total Protein: 8.3 g/dL (ref 6.0–8.3)

## 2013-04-28 LAB — CBC WITH DIFFERENTIAL/PLATELET
Basophils Absolute: 0 10*3/uL (ref 0.0–0.1)
Basophils Relative: 0 % (ref 0–1)
Eosinophils Absolute: 0.1 10*3/uL (ref 0.0–0.7)
Eosinophils Relative: 2 % (ref 0–5)
HCT: 40.9 % (ref 36.0–46.0)
Hemoglobin: 13.8 g/dL (ref 12.0–15.0)
Lymphocytes Relative: 9 % — ABNORMAL LOW (ref 12–46)
Lymphs Abs: 0.8 10*3/uL (ref 0.7–4.0)
MCH: 32 pg (ref 26.0–34.0)
MCHC: 33.7 g/dL (ref 30.0–36.0)
MCV: 94.9 fL (ref 78.0–100.0)
Monocytes Absolute: 0.4 10*3/uL (ref 0.1–1.0)
Monocytes Relative: 4 % (ref 3–12)
Neutro Abs: 7.5 10*3/uL (ref 1.7–7.7)
Neutrophils Relative %: 85 % — ABNORMAL HIGH (ref 43–77)
Platelets: 209 10*3/uL (ref 150–400)
RBC: 4.31 MIL/uL (ref 3.87–5.11)
RDW: 14.2 % (ref 11.5–15.5)
WBC: 8.8 10*3/uL (ref 4.0–10.5)

## 2013-04-28 LAB — LIPASE, BLOOD: Lipase: 37 U/L (ref 11–59)

## 2013-04-28 NOTE — ED Notes (Signed)
[id number]<AV> Expected date:<BR> Expected time:<BR> Means of arrival:<BR> Comments:<BR> Hold for [place]; unable to move in computer

## 2013-04-28 NOTE — ED Notes (Addendum)
Pt in c/o dizziness and headache today, states BP was elevated at home and was advised by PCP to come in for evaluation. Also c/o dysuria and lower back pain.

## 2013-04-28 NOTE — Telephone Encounter (Signed)
I do not want to write for a compounded cream at the present time. I need to do more research on these compounds.

## 2013-04-28 NOTE — Telephone Encounter (Signed)
Faxed denial for the cream. Thanks.

## 2013-04-28 NOTE — Telephone Encounter (Signed)
Do you want patient to have a compounded cream for her muscular pain with baclofen 2% dextromethenphine 8% cyclobenzoprene. Please advise.

## 2013-04-29 ENCOUNTER — Telehealth: Payer: Self-pay | Admitting: Emergency Medicine

## 2013-04-29 LAB — URINALYSIS, ROUTINE W REFLEX MICROSCOPIC
Bilirubin Urine: NEGATIVE
Glucose, UA: NEGATIVE mg/dL
Ketones, ur: NEGATIVE mg/dL
Leukocytes, UA: NEGATIVE
Nitrite: NEGATIVE
Protein, ur: NEGATIVE mg/dL
Specific Gravity, Urine: 1.02 (ref 1.005–1.030)
Urobilinogen, UA: 0.2 mg/dL (ref 0.0–1.0)
pH: 5 (ref 5.0–8.0)

## 2013-04-29 LAB — URINE MICROSCOPIC-ADD ON

## 2013-04-29 MED ORDER — TRAMADOL HCL 50 MG PO TABS: 50.0000 mg | ORAL_TABLET | Freq: Four times a day (QID) | ORAL | Status: DC | PRN

## 2013-04-29 MED ORDER — TRAMADOL HCL 50 MG PO TABS
50.0000 mg | ORAL_TABLET | Freq: Once | ORAL | Status: AC
  Administered 2013-04-29: 50 mg via ORAL
  Filled 2013-04-29: qty 1

## 2013-04-29 NOTE — Telephone Encounter (Signed)
I received a phone call last night regarding patient feeling nauseated with weak feeling. She was advised to go to the ER for evaluation. She is under quite a bit of stress at home.

## 2013-05-03 NOTE — ED Provider Notes (Signed)
History    77 year old female with headache and dizziness. Just doesn't feel right. Gradual onset of her headache earlier today. She does not remember what she was specifically doing an onset. She denies any trauma. Headache is diffuse and achy in character. Does not lateralize. No appreciable exacerbating relieving factors. No neck pain or neck stiffness. No fevers or chills. No acute visual complaints. No numbness, tingling or loss of strength. No use of blood thinning medication aside from a baby aspirin daily. Patient is also complaining of some pain in her lower back and some dysuria. No hematuria. No urgency or frequency. No unusual vaginal bleeding or discharge. No intervention prior to arrival.  CSN: [id number]  Arrival date & time 04/28/13  2201   First MD Initiated Contact with Patient 04/28/13 2258      Chief Complaint  Patient presents with  . Dizziness  . Dysuria    (Consider location/radiation/quality/duration/timing/severity/associated sxs/prior treatment) HPI  Past Medical History  Diagnosis Date  . Hypertension   . GERD (gastroesophageal reflux disease)     + hpylori  . Hyperlipidemia   . Anxiety   . Cancer 15 yrs ago    ho renal s/p nephrectomy  . Diabetes mellitus   . Elevated TSH   . Elevated serum creatinine   . Ischemic colitis   . Renal insufficiency   . Colon polyps 11/2008    tubular adenoma    Past Surgical History  Procedure Laterality Date  . Nephrectomy      R  . Partial hysterectomy  1978    History reviewed. No pertinent family history.  History  Substance Use Topics  . Smoking status: Current Every Day Smoker  . Smokeless tobacco: Never Used     Comment: cut back amount still  trying to quit  . Alcohol Use: No    OB History   Grav Para Term Preterm Abortions TAB SAB Ect Mult Living                  Review of Systems  All systems reviewed and negative, other than as noted in HPI.  Allergies  Ciprocin-fluocin-procin;  Clonidine derivatives; Codeine; Penicillins; Simvastatin; and Tessalon perles  Home Medications   Current Outpatient Rx  Name  Route  Sig  Dispense  Refill  . aspirin 81 MG tablet   Oral   Take 81 mg by mouth daily.         . Cholecalciferol (VITAMIN D-3 PO)   Oral   Take 1 tablet by mouth daily.         . clonazePAM (KLONOPIN) 1 MG tablet   Oral   Take 1 tablet (1 mg total) by mouth 2 (two) times daily as needed.   60 tablet   5   . diltiazem (CARDIZEM CD) 240 MG 24 hr capsule   Oral   Take 240 mg by mouth daily.         Marland Kitchen esomeprazole (NEXIUM) 40 MG capsule   Oral   Take 40 mg by mouth 2 (two) times daily.         . fexofenadine (ALLEGRA) 180 MG tablet   Oral   Take 180 mg by mouth daily.         . fluticasone (VERAMYST) 27.5 MCG/SPRAY nasal spray   Nasal   Place 2 sprays into the nose daily.         . furosemide (LASIX) 40 MG tablet   Oral   Take 40 mg by mouth  daily.         . glipiZIDE (GLUCOTROL XL) 10 MG 24 hr tablet   Oral   Take 10 mg by mouth 2 (two) times daily.         Marland Kitchen levothyroxine (SYNTHROID, LEVOTHROID) 50 MCG tablet   Oral   Take 1 tablet (50 mcg total) by mouth daily.   30 tablet   2   . metoprolol (LOPRESSOR) 100 MG tablet   Oral   Take 1 tablet (100 mg total) by mouth 2 (two) times daily.   60 tablet   11   . rosuvastatin (CRESTOR) 5 MG tablet   Oral   Take 5 mg by mouth daily.         . vitamin E 100 UNIT capsule   Oral   Take 100 Units by mouth daily.         . traMADol (ULTRAM) 50 MG tablet   Oral   Take 1 tablet (50 mg total) by mouth every 6 (six) hours as needed for pain.   15 tablet   0     BP 138/86  Pulse 92  Temp(Src) 97.6 F (36.4 C) (Oral)  Resp 16  SpO2 97%  Physical Exam  Nursing note and vitals reviewed. Constitutional: She appears well-developed and well-nourished. No distress.  HENT:  Head: Normocephalic and atraumatic.  Eyes: Conjunctivae are normal. Pupils are equal, round,  and reactive to light. Right eye exhibits no discharge. Left eye exhibits no discharge.  Neck: Neck supple.  No nuchal rigidity  Cardiovascular: Normal rate, regular rhythm and normal heart sounds.  Exam reveals no gallop and no friction rub.   No murmur heard. Pulmonary/Chest: Effort normal and breath sounds normal. No respiratory distress.  Abdominal: Soft. She exhibits no distension. There is no tenderness.  Musculoskeletal: She exhibits no edema and no tenderness.  Neurological: She is alert. No cranial nerve deficit. She exhibits normal muscle tone. Coordination normal.  Speech clear. Content appropriate. Good finger to nose b/l.   Skin: Skin is warm and dry.  Psychiatric: She has a normal mood and affect. Her behavior is normal. Thought content normal.    ED Course  Procedures (including critical care time)  Labs Reviewed  CBC WITH DIFFERENTIAL - Abnormal; Notable for the following:    Neutrophils Relative % 85 (*)    Lymphocytes Relative 9 (*)    All other components within normal limits  COMPREHENSIVE METABOLIC PANEL - Abnormal; Notable for the following:    Glucose, Bld 102 (*)    Creatinine, Ser 1.27 (*)    GFR calc non Af Amer 41 (*)    GFR calc Af Amer 47 (*)    All other components within normal limits  URINALYSIS, ROUTINE W REFLEX MICROSCOPIC - Abnormal; Notable for the following:    Hgb urine dipstick TRACE (*)    All other components within normal limits  LIPASE, BLOOD  URINE MICROSCOPIC-ADD ON   No results found.   1. Dizziness   2. Lower back pain       MDM  77 year old female with what she calls dizziness, but she is having a hard time describing it beyond this. She is hemodynamically stable. Her workup and EKG are pretty unremarkable. I have a very low suspicion for emergent cause of her HA such as bleed, infectious or mass but doubt. There is no history of trauma. Pt has a nonfocal neurological exam. Afebrile and neck supple. No use of blood thinning  medication aside  from ASA. Consider ocular etiology such as acute angle closure glaucoma but doubt. Pt denies acute change in visual acuity and eye exam unremarkable. Doubt temporal arteritis. No temporal tenderness and temporal artery pulsations palpable. Doubt CO poisoning. No contacts with similar symptoms. Doubt venous thrombosis. Doubt carotid or vertebral arteries dissection. Symptoms improved with meds. Feel that can be safely discharged, but strict return precautions discussed. Outpt fu.         Raeford Razor, MD 05/03/13 [phone]

## 2013-05-12 ENCOUNTER — Telehealth: Payer: Self-pay | Admitting: *Deleted

## 2013-05-12 NOTE — Telephone Encounter (Signed)
Diabetic testing supplies order faxed on 05/11/13 to [facility]

## 2013-05-22 ENCOUNTER — Telehealth: Payer: Self-pay | Admitting: Radiology

## 2013-05-22 NOTE — Telephone Encounter (Signed)
error 

## 2013-06-03 ENCOUNTER — Telehealth: Payer: Self-pay

## 2013-06-03 DIAGNOSIS — L309 Dermatitis, unspecified: Secondary | ICD-10-CM

## 2013-06-03 NOTE — Telephone Encounter (Signed)
Called pt to ask whether she had req'd a Rx for topical pain med from compounding company we had received fax from. Pt reported that she did not req Rx and does not want one. I have faxed back notice stating this.  Pt is complaining of eczema on side of hand that keeps coming back. Dr Cleta Alberts has Rx'd a few different topical meds in the past that pt stated did not work well, and has had some success w/a moisturizer w/cortisone in it that she got OTC, but can no longer find it, and the eczema still came back. Pt reqs that Dr Cleta Alberts send in a different Rx for her that she could try. The only topical that I see in pt's paper chart is diprolene AF. I also D/W pt possible referral to dermatologist which she would like, but will have to schedule with them for after her husband's current medical problems are dealt with. Dr Cleta Alberts, do you want to Rx something (other than diprolene), and refer to dermatologist?

## 2013-06-04 NOTE — Telephone Encounter (Signed)
Yes do both. Call in Triamcinolone/eucerin mix 480 grams. Also make Derm referral. Thank you

## 2013-06-09 MED ORDER — TRIAMCINOLONE ACETONIDE 0.1 % EX CREA: TOPICAL_CREAM | Freq: Two times a day (BID) | CUTANEOUS | Status: DC

## 2013-06-09 NOTE — Telephone Encounter (Signed)
Sent in Rx and ordered ref to derm. Notified pt.

## 2013-06-17 ENCOUNTER — Other Ambulatory Visit: Payer: Self-pay | Admitting: Emergency Medicine

## 2013-06-28 ENCOUNTER — Ambulatory Visit (INDEPENDENT_AMBULATORY_CARE_PROVIDER_SITE_OTHER): Payer: Medicare Other | Admitting: Family Medicine

## 2013-06-28 VITALS — BP 160/84 | HR 74 | Temp 98.4°F | Resp 18 | Ht 65.5 in | Wt 183.0 lb

## 2013-06-28 DIAGNOSIS — R109 Unspecified abdominal pain: Secondary | ICD-10-CM

## 2013-06-28 DIAGNOSIS — E119 Type 2 diabetes mellitus without complications: Secondary | ICD-10-CM

## 2013-06-28 DIAGNOSIS — R197 Diarrhea, unspecified: Secondary | ICD-10-CM

## 2013-06-28 LAB — COMPREHENSIVE METABOLIC PANEL
ALT: 19 U/L (ref 0–35)
Alkaline Phosphatase: 57 U/L (ref 39–117)
CO2: 27 mEq/L (ref 19–32)
Sodium: 143 mEq/L (ref 135–145)
Total Bilirubin: 0.7 mg/dL (ref 0.3–1.2)
Total Protein: 6.7 g/dL (ref 6.0–8.3)

## 2013-06-28 LAB — POCT CBC
Granulocyte percent: 59.8 %G (ref 37–80)
HCT, POC: 42.7 % (ref 37.7–47.9)
POC Granulocyte: 4.5 (ref 2–6.9)
POC LYMPH PERCENT: 33.3 %L (ref 10–50)
Platelet Count, POC: 193 10*3/uL (ref 142–424)
RBC: 4.22 M/uL (ref 4.04–5.48)
RDW, POC: 14.4 %

## 2013-06-28 LAB — GLUCOSE, POCT (MANUAL RESULT ENTRY): POC Glucose: 75 mg/dl (ref 70–99)

## 2013-06-28 NOTE — Progress Notes (Signed)
[address]   [place], [place]  [address]   [phone]  Subjective:    Patient ID: Sherri Burns, female    DOB: 02/02/1936, 77 y.o.   MRN: [id number]  HPI This 77 y.o. female presents for evaluation of diarrhea.  Onset four days ago when took husband to [facility]  All night the first day; continued the next two days.  Up all night with diarrhea.  Abdominal cramping.  +incontinence.  Wearing pads.  Portable toilet next to bed.  Acute in onset.  No sleep.  Feels terrible.  Husband with pancreatic cancer.  No fever but +sweats, +chills.  Stool feels hot and awful odor.  Non-bloody, +mucous.  On day of onset, greens turnip, okra, onions, squash, stew beef.  Girlfriend brought plate of brownies.  Drank mocha whole glass.  Now stool pumpkin color; after that started looking yellow pale.  Two Glipizide pills were in stool.  No nausea, vomiting.  +HA.  Large stools.  +abdominal pain.  No sick contacts.  No recent antibiotics.  History of ischemic colitis; followed by [facility].  Total of 10+ stools yesterday.  Hemorrhoids raw.  Colonoscopy last 2 years ago which revealed diverticulosis, polyps; due for repeat.  No camping.  No foreign travel.  History of diverticulitis; feels like it.  Dr. Kinnie Scales hospitalized patient for bloody stools, diarrhea in past. Has not checked sugar.  Drinking ginger ale and water.  No medications taken for diarrhea   Review of Systems  Constitutional: Positive for chills, diaphoresis, appetite change and fatigue. Negative for fever.  Gastrointestinal: Positive for abdominal pain and diarrhea. Negative for nausea, vomiting, constipation, blood in stool and abdominal distention.  Neurological: Negative for dizziness, light-headedness and headaches.    Past Medical History  Diagnosis Date  . Hypertension   . GERD (gastroesophageal reflux disease)     + hpylori  . Hyperlipidemia   . Anxiety   . Cancer 15 yrs ago    ho renal s/p nephrectomy  . Diabetes mellitus   . Elevated  TSH   . Elevated serum creatinine   . Ischemic colitis   . Renal insufficiency   . Colon polyps 11/2008    tubular adenoma    Past Surgical History  Procedure Laterality Date  . Nephrectomy      R  . Partial hysterectomy  1978    Prior to Admission medications   Medication Sig Start Date End Date Taking? Authorizing Provider  aspirin 81 MG tablet Take 81 mg by mouth daily.   Yes Historical Provider, MD  CARTIA XT 240 MG 24 hr capsule TAKE ONE CAPSULE BY MOUTH ONE TIME DAILY. 06/17/13  Yes Ryan M Dunn, PA-C  Cholecalciferol (VITAMIN D-3 PO) Take 1 tablet by mouth daily.   Yes Historical Provider, MD  clonazePAM (KLONOPIN) 1 MG tablet Take 1 tablet (1 mg total) by mouth 2 (two) times daily as needed. 04/08/13  Yes Collene Gobble, MD  esomeprazole (NEXIUM) 40 MG capsule Take 40 mg by mouth 2 (two) times daily.   Yes Historical Provider, MD  fexofenadine (ALLEGRA) 180 MG tablet Take 180 mg by mouth daily.   Yes Historical Provider, MD  fluticasone (VERAMYST) 27.5 MCG/SPRAY nasal spray Place 2 sprays into the nose daily.   Yes Historical Provider, MD  furosemide (LASIX) 40 MG tablet Take 40 mg by mouth daily.   Yes Historical Provider, MD  glipiZIDE (GLUCOTROL XL) 10 MG 24 hr tablet Take 10 mg by mouth 2 (two) times  daily.   Yes Historical Provider, MD  metoprolol (LOPRESSOR) 100 MG tablet Take 1 tablet (100 mg total) by mouth 2 (two) times daily. 09/23/12  Yes Collene Gobble, MD  rosuvastatin (CRESTOR) 5 MG tablet Take 5 mg by mouth daily.   Yes Historical Provider, MD  traMADol (ULTRAM) 50 MG tablet Take 1 tablet (50 mg total) by mouth every 6 (six) hours as needed for pain. 04/29/13  Yes Raeford Razor, MD  triamcinolone cream (KENALOG) 0.1 % Apply topically 2 (two) times daily. 06/09/13  Yes Collene Gobble, MD  vitamin E 100 UNIT capsule Take 100 Units by mouth daily.   Yes Historical Provider, MD  levothyroxine (SYNTHROID, LEVOTHROID) 50 MCG tablet Take 1 tablet (50 mcg total) by mouth daily.  06/18/12 06/18/13  Morrell Riddle, PA-C    Allergies  Allergen Reactions  . Ciprocin-Fluocin-Procin (Fluocinolone Acetonide)   . Clonidine Derivatives     Dry mouth  . Codeine     hallucinations  . Penicillins   . Simvastatin     Upset stomach   . Tessalon Perles     Gi upset     History   Social History  . Marital Status: Married    Spouse Name: N/A    Number of Children: N/A  . Years of Education: N/A   Occupational History  . Not on file.   Social History Main Topics  . Smoking status: Current Every Day Smoker  . Smokeless tobacco: Never Used     Comment: cut back amount still  trying to quit  . Alcohol Use: No  . Drug Use: No  . Sexually Active: Not on file   Other Topics Concern  . Not on file   Social History Narrative   Loss of son.    No family history on file.     Objective:   Physical Exam  Nursing note and vitals reviewed. Constitutional: She is oriented to person, place, and time. She appears well-developed and well-nourished. No distress.  HENT:  Mouth/Throat: Oropharynx is clear and moist.  Eyes: Conjunctivae and EOM are normal. Pupils are equal, round, and reactive to light.  Neck: Normal range of motion. Neck supple.  Cardiovascular: Normal rate, regular rhythm and normal heart sounds.  Exam reveals no gallop and no friction rub.   No murmur heard. Pulmonary/Chest: Effort normal and breath sounds normal. She has no wheezes. She has no rales.  Abdominal: Soft. Bowel sounds are normal. She exhibits no distension and no mass. There is tenderness in the right upper quadrant, right lower quadrant, epigastric area, left upper quadrant and left lower quadrant. There is no rebound and no guarding.  Neurological: She is alert and oriented to person, place, and time.  Skin: Skin is warm and dry. No rash noted. She is not diaphoretic.  Psychiatric: She has a normal mood and affect. Her behavior is normal.    Results for orders placed in visit on  06/28/13  POCT CBC      Result Value Range   WBC 7.6  4.6 - 10.2 K/uL   Lymph, poc 2.5  0.6 - 3.4   POC LYMPH PERCENT 33.3  10 - 50 %L   MID (cbc) 0.5  0 - 0.9   POC MID % 6.9  0 - 12 %M   POC Granulocyte 4.5  2 - 6.9   Granulocyte percent 59.8  37 - 80 %G   RBC 4.22  4.04 - 5.48 M/uL   Hemoglobin 13.2  12.2 - 16.2 g/dL   HCT, POC 91.4  78.2 - 47.9 %   MCV 101.1 (*) 80 - 97 fL   MCH, POC 31.3 (*) 27 - 31.2 pg   MCHC 30.9 (*) 31.8 - 35.4 g/dL   RDW, POC 95.6     Platelet Count, POC 193  142 - 424 K/uL   MPV 8.8  0 - 99.8 fL  GLUCOSE, POCT (MANUAL RESULT ENTRY)      Result Value Range   POC Glucose 75  70 - 99 mg/dl       Assessment & Plan:  Diarrhea - Plan: Stool culture, Clostridium difficile EIA, CT Abdomen Pelvis W Contrast, Clostridium difficile EIA, CANCELED: Clostridium difficile EIA  Abdominal  pain, other specified site - Plan: Comprehensive metabolic panel, Stool culture, Clostridium difficile EIA, CT Abdomen Pelvis W Contrast, CANCELED: Clostridium difficile EIA  Type II or unspecified type diabetes mellitus without mention of complication, not stated as uncontrolled - Plan: POCT CBC, POCT glucose (manual entry)   1.  Diarrhea:  New.  Moderate to severe diarrhea for four days; obtain labs, stool culture, C.Diff. Obtain CT abd/pelvis to evaluate for colitis versus diverticulitis.  BRAT diet, hydration. RTC for acute worsening.  Recommend Imodium one tablet bid only to slow down diarrhea. 2.  Abdominal pain: New. Diffuse.  Associated with diarrhea. Benign exam in office; obtain CT abd/pelvis. 3. DMII: stable despite acute infection; monitor closely with acute process.

## 2013-06-28 NOTE — Patient Instructions (Addendum)
1.  IMODIUM ONE PILL TWICE DAILY FOR DIARRHEA. 2.  RETURN STOOL STUDIES ON Monday TO THE OFFICE. 3.  WE WILL CALL YOU IN A.M. WITH APPOINTMENT FOR CAT SCAN.

## 2013-06-29 ENCOUNTER — Ambulatory Visit
Admission: RE | Admit: 2013-06-29 | Discharge: 2013-06-29 | Disposition: A | Discharge status: Home / Self Care | Payer: Medicare Other | Source: Ambulatory Visit | Attending: Family Medicine | Admitting: Family Medicine

## 2013-06-29 DIAGNOSIS — R197 Diarrhea, unspecified: Secondary | ICD-10-CM

## 2013-06-29 DIAGNOSIS — R109 Unspecified abdominal pain: Secondary | ICD-10-CM

## 2013-06-29 MED ORDER — IOHEXOL 300 MG/ML  SOLN
30.0000 mL | Freq: Once | INTRAMUSCULAR | Status: AC | PRN
  Administered 2013-06-29: 30 mL via ORAL

## 2013-06-29 MED ORDER — IOHEXOL 300 MG/ML  SOLN
100.0000 mL | Freq: Once | INTRAMUSCULAR | Status: AC | PRN
  Administered 2013-06-29: 100 mL via INTRAVENOUS

## 2013-06-29 NOTE — Addendum Note (Signed)
Addended by: Thelma Barge D on: 06/29/2013 02:24 PM   Modules accepted: Orders

## 2013-07-20 ENCOUNTER — Other Ambulatory Visit: Payer: Self-pay | Admitting: Physician Assistant

## 2013-07-20 ENCOUNTER — Other Ambulatory Visit: Payer: Self-pay | Admitting: Emergency Medicine

## 2013-08-04 ENCOUNTER — Other Ambulatory Visit: Payer: Self-pay | Admitting: Emergency Medicine

## 2013-08-11 ENCOUNTER — Ambulatory Visit (INDEPENDENT_AMBULATORY_CARE_PROVIDER_SITE_OTHER): Payer: Medicare Other | Admitting: Emergency Medicine

## 2013-08-11 ENCOUNTER — Encounter: Payer: Self-pay | Admitting: Emergency Medicine

## 2013-08-11 VITALS — BP 136/80 | HR 51 | Temp 98.5°F | Resp 16 | Ht 66.0 in | Wt 183.0 lb

## 2013-08-11 DIAGNOSIS — Z23 Encounter for immunization: Secondary | ICD-10-CM

## 2013-08-11 DIAGNOSIS — E785 Hyperlipidemia, unspecified: Secondary | ICD-10-CM

## 2013-08-11 DIAGNOSIS — E039 Hypothyroidism, unspecified: Secondary | ICD-10-CM

## 2013-08-11 DIAGNOSIS — I10 Essential (primary) hypertension: Secondary | ICD-10-CM

## 2013-08-11 DIAGNOSIS — N289 Disorder of kidney and ureter, unspecified: Secondary | ICD-10-CM

## 2013-08-11 LAB — BASIC METABOLIC PANEL
BUN: 14 mg/dL (ref 6–23)
Calcium: 9.6 mg/dL (ref 8.4–10.5)
Glucose, Bld: 159 mg/dL — ABNORMAL HIGH (ref 70–99)

## 2013-08-11 LAB — POCT GLYCOSYLATED HEMOGLOBIN (HGB A1C): Hemoglobin A1C: 6.9

## 2013-08-11 LAB — LIPID PANEL
Cholesterol: 253 mg/dL — ABNORMAL HIGH (ref 0–200)
HDL: 35 mg/dL — ABNORMAL LOW (ref >39)
Total CHOL/HDL Ratio: 7.2 Ratio

## 2013-08-11 MED ORDER — ROSUVASTATIN CALCIUM 5 MG PO TABS: 5.0000 mg | ORAL_TABLET | Freq: Every day | ORAL | Status: DC

## 2013-08-11 MED ORDER — ESOMEPRAZOLE MAGNESIUM 40 MG PO CPDR: DELAYED_RELEASE_CAPSULE | ORAL | Status: DC

## 2013-08-11 MED ORDER — FUROSEMIDE 40 MG PO TABS: ORAL_TABLET | ORAL | Status: DC

## 2013-08-11 MED ORDER — DILTIAZEM HCL ER COATED BEADS 240 MG PO CP24: ORAL_CAPSULE | ORAL | Status: DC

## 2013-08-11 MED ORDER — GLIPIZIDE ER 10 MG PO TB24: ORAL_TABLET | ORAL | Status: DC

## 2013-08-11 MED ORDER — FLUTICASONE FUROATE 27.5 MCG/SPRAY NA SUSP: 2.0000 | Freq: Every day | NASAL | Status: DC

## 2013-08-11 MED ORDER — METOPROLOL TARTRATE 100 MG PO TABS: 100.0000 mg | ORAL_TABLET | Freq: Two times a day (BID) | ORAL | Status: DC

## 2013-08-11 NOTE — Progress Notes (Signed)
  Subjective:    Patient ID: Sherri Burns, female    DOB: [birth year], 77 y.o.   MRN: [id number]  HPI patient for followup diabetes high blood pressure and renal insufficiency. She's been doing well recently. Her grandson has moved out of the house and this does take a lot of stress off. She has been supportive of her husband who is going through chemotherapy for pancreatic cancer. She's trying to watch her diet. She is taking her medications as instructed    Review of Systems     Objective:   Physical Exam patient is alert and cooperative she is not in distress. Her chest was clear heart regular rate no murmurs extremity exam reveals no cyanosis clubbing or edema.        Results for orders placed in visit on 08/11/13  GLUCOSE, POCT (MANUAL RESULT ENTRY)      Result Value Range   POC Glucose 156 (*) 70 - 99 mg/dl  POCT GLYCOSYLATED HEMOGLOBIN (HGB A1C)      Result Value Range   Hemoglobin A1C 6.9     Assessment & Plan:  Patient is stable at present. A1c was 6.9 her last being 7.2. Would not make any adjustments at present. She does have renal insufficiency and this will make diabetes treatment more complicated in the future.

## 2013-08-12 LAB — TSH: TSH: 1.989 u[IU]/mL (ref 0.350–4.500)

## 2013-08-13 ENCOUNTER — Other Ambulatory Visit: Payer: Self-pay

## 2013-08-13 MED ORDER — FLUTICASONE PROPIONATE 50 MCG/ACT NA SUSP: 2.0000 | Freq: Every day | NASAL | Status: DC

## 2013-08-13 NOTE — Telephone Encounter (Signed)
Pharm advised Veramyst not covered by ins, per Dr Cleta Alberts OK to change to [facility]. Rx changed in EPIC also.

## 2013-09-22 ENCOUNTER — Telehealth: Payer: Self-pay

## 2013-09-22 NOTE — Telephone Encounter (Signed)
Sherri Burns FROM [employer] WOULD LIKE TO SPEAK WITH SOMEONE REGARDING A MEDICATION REVIEW THEY SENT FOR DR DAUB TO SIGN AND RETURN PLEASE CALL [phone]

## 2013-09-22 NOTE — Telephone Encounter (Signed)
Called. No known form. They will refax.

## 2013-09-23 NOTE — Telephone Encounter (Signed)
Received med review form and I have placed it in Dr Ellis Parents box for review.

## 2013-09-24 NOTE — Telephone Encounter (Signed)
Please return call. Patient was placed on Crestor 5 mg she cannot tolerate this medication. She has tried other statin drugs in the past and she cannot tolerate them. Please place a call to [facility]

## 2013-09-29 NOTE — Telephone Encounter (Signed)
Received another fax from [facility] and have written response to request to put pt on statin that pt can not tolerate. [employer] also suggests putting pt on ACE inhibitor or ARB. Do you want me to mark that you will or will not address and/or comment on this on form?

## 2013-09-29 NOTE — Telephone Encounter (Signed)
I have completed form per Dr Ellis Parents instr's and put in fax box to be faxed back to [facility]. Scanned.

## 2013-09-29 NOTE — Telephone Encounter (Signed)
Send a note the patient is intolerant of statins. We will address the issue about an ARB on her followup visit

## 2013-10-23 ENCOUNTER — Other Ambulatory Visit: Payer: Self-pay | Admitting: Emergency Medicine

## 2013-10-25 ENCOUNTER — Other Ambulatory Visit: Payer: Self-pay

## 2013-11-09 ENCOUNTER — Other Ambulatory Visit: Payer: Self-pay

## 2013-11-09 MED ORDER — GLUCOSE BLOOD VI STRP: ORAL_STRIP | Status: DC

## 2013-11-09 MED ORDER — BLOOD GLUCOSE MONITOR KIT: PACK | Status: DC

## 2013-11-09 NOTE — Telephone Encounter (Signed)
Received notice from ins that pt's test strips will not longer be covered. Requested that new rxs for meter and strips be sent in for ACCU_CHEK or OneTouch. I have printed and faxed new rxs to [facility] on [address] per pt request.

## 2013-11-17 ENCOUNTER — Ambulatory Visit: Payer: Medicare Other | Admitting: Emergency Medicine

## 2013-11-26 ENCOUNTER — Telehealth: Payer: Self-pay

## 2013-11-26 ENCOUNTER — Other Ambulatory Visit: Payer: Self-pay | Admitting: Emergency Medicine

## 2013-11-26 NOTE — Telephone Encounter (Signed)
Faxed request from "RX" for a compounded topical cream. Faxed denial w/note that pt has not been eval for this and will need to ask for it herself and come in for eval. Asked NOT to re-fax req.

## 2013-12-25 ENCOUNTER — Other Ambulatory Visit: Payer: Self-pay | Admitting: Emergency Medicine

## 2013-12-27 ENCOUNTER — Other Ambulatory Visit: Payer: Self-pay

## 2014-01-21 ENCOUNTER — Other Ambulatory Visit: Payer: Self-pay | Admitting: Emergency Medicine

## 2014-01-26 ENCOUNTER — Other Ambulatory Visit: Payer: Self-pay | Admitting: Emergency Medicine

## 2014-02-25 ENCOUNTER — Other Ambulatory Visit: Payer: Self-pay | Admitting: Emergency Medicine

## 2014-02-26 NOTE — Telephone Encounter (Signed)
Faxed

## 2014-03-03 ENCOUNTER — Telehealth: Payer: Self-pay

## 2014-03-03 MED ORDER — GLUCOSE BLOOD VI STRP: ORAL_STRIP | Status: DC

## 2014-03-03 NOTE — Telephone Encounter (Signed)
[facility] mail order called to ask for new order for testing strips through mail order. OKd 90 day w/1RF over the phone and updated in EPIC

## 2014-03-14 ENCOUNTER — Ambulatory Visit (INDEPENDENT_AMBULATORY_CARE_PROVIDER_SITE_OTHER): Payer: Medicare Other | Admitting: Emergency Medicine

## 2014-03-14 VITALS — BP 130/80 | HR 63 | Temp 98.1°F | Resp 16 | Ht 65.0 in | Wt 184.0 lb

## 2014-03-14 DIAGNOSIS — L309 Dermatitis, unspecified: Secondary | ICD-10-CM

## 2014-03-14 DIAGNOSIS — R21 Rash and other nonspecific skin eruption: Secondary | ICD-10-CM

## 2014-03-14 DIAGNOSIS — I10 Essential (primary) hypertension: Secondary | ICD-10-CM

## 2014-03-14 DIAGNOSIS — L259 Unspecified contact dermatitis, unspecified cause: Secondary | ICD-10-CM

## 2014-03-14 DIAGNOSIS — E785 Hyperlipidemia, unspecified: Secondary | ICD-10-CM

## 2014-03-14 DIAGNOSIS — E119 Type 2 diabetes mellitus without complications: Secondary | ICD-10-CM

## 2014-03-14 LAB — COMPREHENSIVE METABOLIC PANEL
ALBUMIN: 4.3 g/dL (ref 3.5–5.2)
ALT: 26 U/L (ref 0–35)
AST: 20 U/L (ref 0–37)
Alkaline Phosphatase: 67 U/L (ref 39–117)
BUN: 19 mg/dL (ref 6–23)
CALCIUM: 9.3 mg/dL (ref 8.4–10.5)
CHLORIDE: 103 meq/L (ref 96–112)
CO2: 29 mEq/L (ref 19–32)
CREATININE: 1.25 mg/dL — AB (ref 0.50–1.10)
GLUCOSE: 179 mg/dL — AB (ref 70–99)
POTASSIUM: 4.7 meq/L (ref 3.5–5.3)
Sodium: 139 mEq/L (ref 135–145)
Total Bilirubin: 0.5 mg/dL (ref 0.2–1.2)
Total Protein: 7.1 g/dL (ref 6.0–8.3)

## 2014-03-14 LAB — LIPID PANEL
Cholesterol: 261 mg/dL — ABNORMAL HIGH (ref 0–200)
HDL: 33 mg/dL — ABNORMAL LOW (ref >39)
LDL Cholesterol: 156 mg/dL — ABNORMAL HIGH (ref 0–99)
Total CHOL/HDL Ratio: 7.9 Ratio
Triglycerides: 358 mg/dL — ABNORMAL HIGH (ref <150)
VLDL: 72 mg/dL — ABNORMAL HIGH (ref 0–40)

## 2014-03-14 LAB — POCT GLYCOSYLATED HEMOGLOBIN (HGB A1C): HEMOGLOBIN A1C: 7.9

## 2014-03-14 LAB — POCT SKIN KOH: Skin KOH, POC: NEGATIVE

## 2014-03-14 LAB — GLUCOSE, POCT (MANUAL RESULT ENTRY): POC GLUCOSE: 159 mg/dL — AB (ref 70–99)

## 2014-03-14 MED ORDER — TRAMADOL HCL 50 MG PO TABS: 50.0000 mg | ORAL_TABLET | Freq: Four times a day (QID) | ORAL | Status: DC | PRN

## 2014-03-14 NOTE — Progress Notes (Addendum)
   Subjective:  This chart was scribed for [name], MD by Ludger Nutting, ED Scribe. This patient was seen in room 10 and the patient's care was started 9:35 AM.    Patient ID: Sherri Burns, female    DOB: [birth year], 78 y.o.   MRN: [id number]  HPI HPI Comments: Sherri Burns is a 78 y.o. female who presents to [facility] for a follow up visit today. Patient's husband has end stage pancreatic cancer and she reports increased stress while caring for him. She states her husband's children refuse to help him and she is unable to care for him on her own.   She also reports constant, unchanged scaly and dry skin to the right great toe and bilateral palms.    Review of Systems  Skin: Positive for rash.  Psychiatric/Behavioral: The patient is nervous/anxious (increased stress).        Objective:   Physical Exam  Vitals reviewed.  CONSTITUTIONAL: Well developed/well nourished HEAD: Normocephalic/atraumatic EYES: EOMI/PERRL ENMT: Mucous membranes moist NECK: supple no meningeal signs SPINE:entire spine nontender CV: S1/S2 noted, no murmurs/rubs/gallops noted LUNGS: Lungs are clear to auscultation bilaterally, no apparent distress ABDOMEN: soft, nontender, no rebound or guarding GU:no cva tenderness NEURO: Pt is awake/alert, moves all extremitiesx4 EXTREMITIES: pulses normal, full ROM SKIN: warm, color normal PSYCH: no abnormalities of mood noted  Results for orders placed in visit on 03/14/14  GLUCOSE, POCT (MANUAL RESULT ENTRY)      Result Value Ref Range   POC Glucose 159 (*) 70 - 99 mg/dl  POCT GLYCOSYLATED HEMOGLOBIN (HGB A1C)      Result Value Ref Range   Hemoglobin A1C 7.9    POCT SKIN KOH      Result Value Ref Range   Skin KOH, POC Negative          Assessment & Plan:   Hemoglobin A1c is supple point. She needs to focus on diet and exercise. She is under a great deal of stress at home with her husband having terminal pancreatic cancer. She needs to focus on her  own house with diet and exercise and take her medications regularly. I did refill her tramadol for back pain. I did encourage her to talk with her brother who is a Company secretary about her situation I personally performed the services described in this documentation, which was scribed in my presence. The recorded information has been reviewed and is accurate.

## 2014-03-26 ENCOUNTER — Other Ambulatory Visit: Payer: Self-pay | Admitting: Physician Assistant

## 2014-03-26 ENCOUNTER — Other Ambulatory Visit: Payer: Self-pay | Admitting: Emergency Medicine

## 2014-03-29 NOTE — Telephone Encounter (Signed)
faxed

## 2014-03-29 NOTE — Telephone Encounter (Signed)
Dr Everlene Farrier, you just saw pt but not for thyroid, can we give RFs?

## 2014-04-28 ENCOUNTER — Other Ambulatory Visit: Payer: Self-pay | Admitting: Emergency Medicine

## 2014-04-30 NOTE — Telephone Encounter (Signed)
Faxed

## 2014-05-05 ENCOUNTER — Ambulatory Visit (INDEPENDENT_AMBULATORY_CARE_PROVIDER_SITE_OTHER): Payer: Medicare Other | Admitting: Emergency Medicine

## 2014-05-05 VITALS — BP 118/80 | HR 59 | Temp 97.9°F | Resp 18 | Wt 184.0 lb

## 2014-05-05 DIAGNOSIS — E119 Type 2 diabetes mellitus without complications: Secondary | ICD-10-CM

## 2014-05-05 DIAGNOSIS — L309 Dermatitis, unspecified: Secondary | ICD-10-CM

## 2014-05-05 DIAGNOSIS — R21 Rash and other nonspecific skin eruption: Secondary | ICD-10-CM

## 2014-05-05 DIAGNOSIS — L259 Unspecified contact dermatitis, unspecified cause: Secondary | ICD-10-CM

## 2014-05-05 LAB — POCT SKIN KOH: Skin KOH, POC: NEGATIVE

## 2014-05-05 LAB — GLUCOSE, POCT (MANUAL RESULT ENTRY): POC GLUCOSE: 139 mg/dL — AB (ref 70–99)

## 2014-05-05 MED ORDER — MUPIROCIN 2 % EX OINT: TOPICAL_OINTMENT | CUTANEOUS | Status: DC

## 2014-05-05 MED ORDER — TRIAMCINOLONE ACETONIDE 0.1 % EX CREA: TOPICAL_CREAM | CUTANEOUS | Status: DC

## 2014-05-05 NOTE — Patient Instructions (Signed)
Eczema Eczema, also called atopic dermatitis, is a skin disorder that causes inflammation of the skin. It causes a red rash and dry, scaly skin. The skin becomes very itchy. Eczema is generally worse during the cooler winter months and often improves with the warmth of summer. Eczema usually starts showing signs in infancy. Some children outgrow eczema, but it may last through adulthood.  CAUSES  The exact cause of eczema is not known, but it appears to run in families. People with eczema often have a family history of eczema, allergies, asthma, or hay fever. Eczema is not contagious. Flare-ups of the condition may be caused by:   Contact with something you are sensitive or allergic to.   Stress. SIGNS AND SYMPTOMS  Dry, scaly skin.   Red, itchy rash.   Itchiness. This may occur before the skin rash and may be very intense.  DIAGNOSIS  The diagnosis of eczema is usually made based on symptoms and medical history. TREATMENT  Eczema cannot be cured, but symptoms usually can be controlled with treatment and other strategies. A treatment plan might include:  Controlling the itching and scratching.   Use over-the-counter antihistamines as directed for itching. This is especially useful at night when the itching tends to be worse.   Use over-the-counter steroid creams as directed for itching.   Avoid scratching. Scratching makes the rash and itching worse. It may also result in a skin infection (impetigo) due to a break in the skin caused by scratching.   Keeping the skin well moisturized with creams every day. This will seal in moisture and help prevent dryness. Lotions that contain alcohol and water should be avoided because they can dry the skin.   Limiting exposure to things that you are sensitive or allergic to (allergens).   Recognizing situations that cause stress.   Developing a plan to manage stress.  HOME CARE INSTRUCTIONS   Only take over-the-counter or  prescription medicines as directed by your health care provider.   Do not use anything on the skin without checking with your health care provider.   Keep baths or showers short (5 minutes) in warm (not hot) water. Use mild cleansers for bathing. These should be unscented. You may add nonperfumed bath oil to the bath water. It is best to avoid soap and bubble bath.   Immediately after a bath or shower, when the skin is still damp, apply a moisturizing ointment to the entire body. This ointment should be a petroleum ointment. This will seal in moisture and help prevent dryness. The thicker the ointment, the better. These should be unscented.   Keep fingernails cut short. Children with eczema may need to wear soft gloves or mittens at night after applying an ointment.   Dress in clothes made of cotton or cotton blends. Dress lightly, because heat increases itching.   A child with eczema should stay away from anyone with fever blisters or cold sores. The virus that causes fever blisters (herpes simplex) can cause a serious skin infection in children with eczema. SEEK MEDICAL CARE IF:   Your itching interferes with sleep.   Your rash gets worse or is not better within 1 week after starting treatment.   You see pus or soft yellow scabs in the rash area.   You have a fever.   You have a rash flare-up after contact with someone who has fever blisters.  Document Released: 11/09/2000 Document Revised: 09/02/2013 Document Reviewed: 06/15/2013 [facility] Patient Information 2014 [place].

## 2014-05-05 NOTE — Progress Notes (Signed)
   Subjective:    Patient ID: Sherri Burns, female    DOB: [birth year], 78 y.o.   MRN: [id number]  HPI patient enters with history of worsening rash on her palms and feet. She had increased pain in her left great toe associated with some peeling of the skin of the foot. She has a history of dyshidrotic eczema and has been on creams for this    Review of Systems     Objective:   Physical Exam is mild tenderness of the left great toe. She has dyshidrotic changes of the plantar surface of both feet and also of the palms of both hands.  Results for orders placed in visit on 05/05/14  POCT SKIN KOH      Result Value Ref Range   Skin KOH, POC Negative     Glu 139     Assessment & Plan:  She will use Bactroban ointment around the nail. She will continue the cream that she has and increase it to 3 times a day keep her hands out of water and use white cotton covering of her hands and feet at that time. Her husband is currently gone of pancreatic cancer. Advised her to contact hospice so she can get some help at home .

## 2014-05-06 ENCOUNTER — Telehealth: Payer: Self-pay

## 2014-05-06 NOTE — Telephone Encounter (Signed)
Pharm faxed notice that ins will not cover the compounded triamc/Euc cream mixture. Called pharmacist and had her try just Triam 0.1% cream by itself and it was covered. Pharmacist will instr's pt to buy eucerin and mix 1:1 at time of application.

## 2014-06-03 ENCOUNTER — Other Ambulatory Visit: Payer: Self-pay | Admitting: Emergency Medicine

## 2014-06-04 NOTE — Telephone Encounter (Signed)
Faxed

## 2014-06-29 ENCOUNTER — Other Ambulatory Visit: Payer: Self-pay | Admitting: Emergency Medicine

## 2014-06-29 NOTE — Telephone Encounter (Signed)
This is about a week early.

## 2014-08-03 ENCOUNTER — Other Ambulatory Visit: Payer: Self-pay | Admitting: Emergency Medicine

## 2014-08-05 NOTE — Telephone Encounter (Signed)
Do you want to RF? 

## 2014-08-06 NOTE — Telephone Encounter (Signed)
Faxed

## 2014-09-01 ENCOUNTER — Telehealth: Payer: Self-pay

## 2014-09-01 NOTE — Telephone Encounter (Signed)
Sherri Burns from [facility] LM on my VM asking for pt's last A1C and BP results. She is a case Freight forwarder helping pt manage these conditions. Called back and couldn't get through to her extension to leave her the info. Called back and was put on hold and then then automatically transferred to a VM that had full VM and couldn't leave message. Will try again later.

## 2014-09-02 NOTE — Telephone Encounter (Signed)
Tried to call again and after being on hold for some time it transferred me to an extension to leave message but message said VM full, same as yesterday. I will close this encounter and wait for her to CB if info is still needed.

## 2014-09-04 ENCOUNTER — Other Ambulatory Visit: Payer: Self-pay | Admitting: Emergency Medicine

## 2014-09-07 ENCOUNTER — Ambulatory Visit (INDEPENDENT_AMBULATORY_CARE_PROVIDER_SITE_OTHER): Payer: Medicare Other | Admitting: Emergency Medicine

## 2014-09-07 ENCOUNTER — Encounter: Payer: Self-pay | Admitting: Emergency Medicine

## 2014-09-07 VITALS — BP 148/92 | HR 65 | Temp 98.0°F | Resp 16 | Ht 65.0 in | Wt 172.6 lb

## 2014-09-07 DIAGNOSIS — B353 Tinea pedis: Secondary | ICD-10-CM

## 2014-09-07 DIAGNOSIS — R252 Cramp and spasm: Secondary | ICD-10-CM

## 2014-09-07 DIAGNOSIS — R21 Rash and other nonspecific skin eruption: Secondary | ICD-10-CM

## 2014-09-07 DIAGNOSIS — M25512 Pain in left shoulder: Secondary | ICD-10-CM

## 2014-09-07 DIAGNOSIS — Z23 Encounter for immunization: Secondary | ICD-10-CM

## 2014-09-07 DIAGNOSIS — E119 Type 2 diabetes mellitus without complications: Secondary | ICD-10-CM

## 2014-09-07 DIAGNOSIS — R079 Chest pain, unspecified: Secondary | ICD-10-CM

## 2014-09-07 LAB — CBC
HCT: 38.1 % (ref 36.0–46.0)
HEMOGLOBIN: 13.2 g/dL (ref 12.0–15.0)
MCH: 31.8 pg (ref 26.0–34.0)
MCHC: 34.6 g/dL (ref 30.0–36.0)
MCV: 91.8 fL (ref 78.0–100.0)
PLATELETS: 244 10*3/uL (ref 150–400)
RBC: 4.15 MIL/uL (ref 3.87–5.11)
RDW: 14.6 % (ref 11.5–15.5)
WBC: 6 10*3/uL (ref 4.0–10.5)

## 2014-09-07 LAB — COMPREHENSIVE METABOLIC PANEL
ALT: 17 U/L (ref 0–35)
AST: 18 U/L (ref 0–37)
Albumin: 4 g/dL (ref 3.5–5.2)
Alkaline Phosphatase: 58 U/L (ref 39–117)
BILIRUBIN TOTAL: 0.5 mg/dL (ref 0.2–1.2)
BUN: 17 mg/dL (ref 6–23)
CO2: 29 mEq/L (ref 19–32)
Calcium: 9.8 mg/dL (ref 8.4–10.5)
Chloride: 101 mEq/L (ref 96–112)
Creat: 1.41 mg/dL — ABNORMAL HIGH (ref 0.50–1.10)
GLUCOSE: 103 mg/dL — AB (ref 70–99)
Potassium: 4.6 mEq/L (ref 3.5–5.3)
SODIUM: 138 meq/L (ref 135–145)
Total Protein: 6.9 g/dL (ref 6.0–8.3)

## 2014-09-07 LAB — MAGNESIUM: MAGNESIUM: 2.1 mg/dL (ref 1.5–2.5)

## 2014-09-07 LAB — CK: Total CK: 83 U/L (ref 7–177)

## 2014-09-07 LAB — POCT GLYCOSYLATED HEMOGLOBIN (HGB A1C): HEMOGLOBIN A1C: 6.6

## 2014-09-07 LAB — LIPID PANEL
CHOL/HDL RATIO: 6.2 ratio
Cholesterol: 222 mg/dL — ABNORMAL HIGH (ref 0–200)
HDL: 36 mg/dL — AB (ref >39)
LDL Cholesterol: 147 mg/dL — ABNORMAL HIGH (ref 0–99)
Triglycerides: 193 mg/dL — ABNORMAL HIGH (ref <150)
VLDL: 39 mg/dL (ref 0–40)

## 2014-09-07 LAB — POCT SKIN KOH: Skin KOH, POC: NEGATIVE

## 2014-09-07 MED ORDER — DILTIAZEM HCL ER COATED BEADS 240 MG PO CP24: ORAL_CAPSULE | ORAL | Status: DC

## 2014-09-07 MED ORDER — CLONAZEPAM 1 MG PO TABS: ORAL_TABLET | ORAL | Status: DC

## 2014-09-07 MED ORDER — MELOXICAM 7.5 MG PO TABS: 7.5000 mg | ORAL_TABLET | Freq: Every day | ORAL | Status: DC

## 2014-09-07 NOTE — Progress Notes (Signed)
Subjective:    Patient ID: Sherri Burns, female    DOB: [birth year], 78 y.o.   MRN: [id number]  Chest Pain  Pertinent negatives include no fever, nausea or vomiting.    Jenny Reichmann, MD  Chief Complaint  Patient presents with  . Chest Pain    into LEFT shoulder X 3 months and LEG CRAMPS all the time   Patient Active Problem List   Diagnosis Date Noted  . Diabetes mellitus 09/23/2012  . Hypertension   . GERD (gastroesophageal reflux disease)   . Hyperlipidemia   . Anxiety   . Cancer   . Elevated TSH   . Renal insufficiency    Prior to Admission medications   Medication Sig Start Date End Date Taking? Authorizing Provider  aspirin 81 MG tablet Take 81 mg by mouth daily.   Yes Historical Provider, MD  Blood Glucose Monitoring Suppl (BLOOD GLUCOSE MONITOR KIT) KIT Use to test blood sugar once daily. Dx code: 250.00. 11/09/13  Yes Sarah Alleen Borne, PA-C  CARTIA XT 240 MG 24 hr capsule TAKE ONE CAPSULE BY MOUTH EVERY DAY 09/07/14  Yes Mancel Bale, PA-C  Cholecalciferol (VITAMIN D-3 PO) Take 1 tablet by mouth daily.   Yes Historical Provider, MD  clonazePAM (KLONOPIN) 1 MG tablet TAKE 1 TABLET BY MOUTH TWICE DAILY AS NEEDED 08/05/14  Yes [name], MD  esomeprazole (NEXIUM) 40 MG capsule Take one tablet daily 08/11/13  Yes [name], MD  fexofenadine (ALLEGRA) 180 MG tablet Take 180 mg by mouth daily.   Yes Historical Provider, MD  fluticasone (FLONASE) 50 MCG/ACT nasal spray Place 2 sprays into the nose daily. 08/13/13  Yes [name], MD  furosemide (LASIX) 40 MG tablet TAKE 1 TABLET BY MOUTH EVERY DAY 09/07/14  Yes Mancel Bale, PA-C  glipiZIDE (GLUCOTROL XL) 10 MG 24 hr tablet TAKE 1 TABLET BY MOUTH TWICE DAILY 09/07/14  Yes Mancel Bale, PA-C  glucose blood test strip Use to test blood sugar once daily. Dx code: 250.00. 03/03/14  Yes [name], MD  levothyroxine (SYNTHROID, LEVOTHROID) 75 MCG tablet TAKE 1 TABLET BY MOUTH EVERY DAY   Yes [name], MD    metoprolol (LOPRESSOR) 100 MG tablet TAKE 1 TABLET BY MOUTH TWICE DAILY 09/07/14  Yes Mancel Bale, PA-C  mupirocin ointment (BACTROBAN) 2 % Applied to the area around the base of the great toenail twice a day 05/05/14  Yes [name], MD  rosuvastatin (CRESTOR) 5 MG tablet Take 1 tablet (5 mg total) by mouth daily. 08/11/13  Yes [name], MD  traMADol (ULTRAM) 50 MG tablet Take 1 tablet (50 mg total) by mouth every 6 (six) hours as needed. 03/14/14  Yes [name], MD  triamcinolone cream (KENALOG) 0.1 % Apply 3 times a day as instructed to 05/05/14  Yes [name], MD  vitamin E 100 UNIT capsule Take 100 Units by mouth daily.   Yes Historical Provider, MD   Medications, allergies, past medical history, surgical history, family history, social history and problem list reviewed and updated.  66 yof with PMH HTN, DMII, hyperlipidemia, anxiety, and GERD presents today complaining of left shoulder pain, chest pain, and leg cramps.   Left shoulder pain - Came on gradually 3 months ago. 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 No pain at rest. Only when she moves  arm or tries to lift arm above head.  Chest pain - Left sided chest pain past several months. Only feels this pain when she is anxious thinking about her husband's passing. She has had several episodes of this many years ago - she was worked up at that time and told it was her "nerves." The pain resolves on it's own. Usually lasts throughout the day when it comes on. She does not have the CP when she is active with house chores or walking, only when she is thinking about issues. She is taking klonopin for anxiety and feels that it helps take the CP away when she takes it. No radiation of pain. No assoc syncope or presyncope. Pos assoc SOB when she is thinking about things.  Leg cramps - Bilateral legs below knees for past few years. Has now started having  cramps both thighs for past couple months. She has been wearing support hose recently which help to take the cramps away. Gets the cramps every 2-3 days, last for few hours at at time.   Husband passed end of July. She is having issues with recovering from this. She is having issues with bills and expenses at this point. She has mostly been keeping to herself since his passing.  She stopped taking her statin several months because it gave her muscle aches.   Review of Systems  Constitutional: Negative for fever.  Cardiovascular: Positive for chest pain.  Gastrointestinal: Negative for nausea and vomiting.  Psychiatric/Behavioral: Negative for confusion.      Objective:   Physical Exam  Constitutional: She is oriented to person, place, and time. She appears well-developed and well-nourished.  BP 148/92  Pulse 65  Temp(Src) 98 F (36.7 C) (Oral)  Resp 16  Ht _0  (1.651 m)  Wt 172 lb 9.6 oz (78.291 kg)  BMI 28.72 kg/m2  SpO2 98%   Cardiovascular: Normal rate, regular rhythm and normal heart sounds.  Exam reveals no gallop.   No murmur heard. Pulses:      Dorsalis pedis pulses are 1+ on the right side, and 1+ on the left side.       Posterior tibial pulses are 1+ on the right side, and 1+ on the left side.  Pulmonary/Chest: Effort normal and breath sounds normal. She has no decreased breath sounds. She has no wheezes. She has no rhonchi. She has no rales. She exhibits no tenderness and no bony tenderness.  Musculoskeletal:       Left shoulder: She exhibits decreased range of motion. She exhibits no tenderness, no bony tenderness, no crepitus and normal strength.       Right lower leg: She exhibits no tenderness, no bony tenderness, no swelling and no edema.       Left lower leg: She exhibits no tenderness, no bony tenderness, no swelling and no edema.  Left shoulder - No TTP over deltoid, AC joint, [place] joint. 5/5 strength. Normal sensation RUE. No joint line tenderness. Tenderness  over anterior shoulder with front raise. No pain with resisted ER. Negative clunk test.  Neurological: She is alert and oriented to person, place, and time.  Psychiatric: Her speech is normal and behavior is normal. She does not exhibit a depressed mood.   EKG: NSR with new nonspecific ST/T wave changes in inferior and lateral leads.   Results for orders placed in visit on 09/07/14  POCT SKIN KOH      Result Value Ref Range   Skin KOH, POC Negative  POCT GLYCOSYLATED HEMOGLOBIN (HGB A1C)      Result Value Ref Range   Hemoglobin A1C 6.6     EKG THERE are anterior lateral T-wave changes present on EKG not present on her last tracing     Assessment & Plan:   21 yof with PMH HTN, DMII, hyperlipidemia, anxiety, and GERD presents today complaining of left shoulder pain, chest pain, and leg cramps.   Chest pain, unspecified chest pain type --Doesn't sound cardiac in nature as it is not associated with exertion or activity. It comes on and resolves all while she is seated and thinking. We will, however, refer to cardiology (she will be seeing [facility] tomorrow) due to her numerous cardiac risk factors and new changes on EKG.  --If turns out to be non-cardiac could be related to stress/anxiety surrounding husband's recent passing. She may benefit from counseling which we will set up for her -->She is not interested at this time. --Not interested in SSRI at this time for anxiety. --We will add one extra 0.5 dose klonopin daily to her current regimen of 47m bid.  Left shoulder pain --Most likely rotator cuff strain from taking care of husband. --Mobic 7.5 mg qd for 10 days. --Ice and heat.  Bilateral leg cramps --Hydration --Continue using hose  --CK, Mg and CMET drawn today.  Tinea Pedis --KOH scraping done - negative  Lab work --CBC, A1C, lipids, CMET, Mg, and CK drawn today.    TJulieta Gutting PA-C Physician Assistant-Certified [email or web address]  09/07/2014 1:59 PM I personally took a history and reviewed the records as well as performed the physical examination and was involved in a referral to the cardiologist for her abnormal EKG.

## 2014-09-07 NOTE — Patient Instructions (Addendum)
#  Chest pain -- Could be related to anxiety from everything you have going on. We have referred you to cardiology, however, as you do have a lot of risk factors for heart disease and some new findings on your EKG. Please take your statin every other day, this will help to reduce the muscle aches you've been having.                     -- We have set you up to see Dr Laurie Panda at [facility] at 1pm tomorrow.                      --[facility]     [place], [place]          Phone# [phone]  #Anxiety -- You may add one extra 0.5 mg dose daily to your current klonopin dose of 1 mg twice daily. Please try to back off of this extra dose as you start to feel better.               -- Please let us know if you change your mind about seeing a counselor.   #Shoulder pain -- Most likely from straining your rotator cuff muscles. Please take the mobic 7.5 mg once daily for the next 10 days. Please alternate between ice and heat on your shoulder.  #Bilateral leg cramps -- Please stay hydrated and continue to wear your hose at night. We will let you know if any labs come back abnormal.   #Toe rash - Scrape done today was negative for a fungal infection.   #Please return to the clinic if your symptoms do not resolve or worsen.

## 2014-09-17 ENCOUNTER — Encounter: Payer: Self-pay | Admitting: Emergency Medicine

## 2014-10-07 ENCOUNTER — Other Ambulatory Visit: Payer: Self-pay | Admitting: Emergency Medicine

## 2014-10-07 ENCOUNTER — Other Ambulatory Visit: Payer: Self-pay | Admitting: Physician Assistant

## 2014-10-15 ENCOUNTER — Other Ambulatory Visit: Payer: Self-pay | Admitting: Emergency Medicine

## 2014-10-15 ENCOUNTER — Other Ambulatory Visit: Payer: Self-pay | Admitting: Physician Assistant

## 2014-10-15 NOTE — Telephone Encounter (Signed)
Dr Everlene Farrier, you just saw pt in Oct, but I don't see this med discussed and no TSH for >yr. Is it OK to give RFs?

## 2014-11-06 ENCOUNTER — Other Ambulatory Visit: Payer: Self-pay | Admitting: Physician Assistant

## 2014-11-09 ENCOUNTER — Telehealth: Payer: Self-pay | Admitting: Radiology

## 2014-11-09 ENCOUNTER — Encounter: Payer: Self-pay | Admitting: Family Medicine

## 2014-11-09 ENCOUNTER — Ambulatory Visit (INDEPENDENT_AMBULATORY_CARE_PROVIDER_SITE_OTHER): Payer: Medicare Other | Admitting: Family Medicine

## 2014-11-09 VITALS — BP 144/84 | HR 68 | Temp 98.3°F | Resp 18 | Ht 65.0 in | Wt 176.0 lb

## 2014-11-09 DIAGNOSIS — K4090 Unilateral inguinal hernia, without obstruction or gangrene, not specified as recurrent: Secondary | ICD-10-CM

## 2014-11-09 DIAGNOSIS — Z79899 Other long term (current) drug therapy: Secondary | ICD-10-CM

## 2014-11-09 DIAGNOSIS — L853 Xerosis cutis: Secondary | ICD-10-CM

## 2014-11-09 DIAGNOSIS — L299 Pruritus, unspecified: Secondary | ICD-10-CM

## 2014-11-09 DIAGNOSIS — L850 Acquired ichthyosis: Secondary | ICD-10-CM

## 2014-11-09 DIAGNOSIS — B351 Tinea unguium: Secondary | ICD-10-CM

## 2014-11-09 DIAGNOSIS — B353 Tinea pedis: Secondary | ICD-10-CM

## 2014-11-09 DIAGNOSIS — E039 Hypothyroidism, unspecified: Secondary | ICD-10-CM

## 2014-11-09 DIAGNOSIS — R1032 Left lower quadrant pain: Secondary | ICD-10-CM

## 2014-11-09 LAB — POCT URINALYSIS DIPSTICK
Glucose, UA: NEGATIVE
Ketones, UA: NEGATIVE
Leukocytes, UA: NEGATIVE
Nitrite, UA: NEGATIVE
RBC UA: NEGATIVE
SPEC GRAV UA: 1.025
Urobilinogen, UA: 1
pH, UA: 5.5

## 2014-11-09 LAB — POCT CBC
Granulocyte percent: 54.5 %G (ref 37–80)
HCT, POC: 42 % (ref 37.7–47.9)
Hemoglobin: 13.3 g/dL (ref 12.2–16.2)
Lymph, poc: 3.2 (ref 0.6–3.4)
MCH: 31 pg (ref 27–31.2)
MCHC: 31.8 g/dL (ref 31.8–35.4)
MCV: 97.6 fL — AB (ref 80–97)
MID (cbc): 0.3 (ref 0–0.9)
MPV: 8.4 fL (ref 0–99.8)
POC GRANULOCYTE: 4.1 (ref 2–6.9)
POC LYMPH %: 41.5 % (ref 10–50)
POC MID %: 4 %M (ref 0–12)
Platelet Count, POC: 188 10*3/uL (ref 142–424)
RBC: 4.31 M/uL (ref 4.04–5.48)
RDW, POC: 14.6 %
WBC: 7.6 10*3/uL (ref 4.6–10.2)

## 2014-11-09 LAB — POCT UA - MICROSCOPIC ONLY
CASTS, UR, LPF, POC: NEGATIVE
Crystals, Ur, HPF, POC: NEGATIVE
MUCUS UA: POSITIVE
YEAST UA: NEGATIVE

## 2014-11-09 LAB — COMPREHENSIVE METABOLIC PANEL
ALT: 13 U/L (ref 0–35)
AST: 16 U/L (ref 0–37)
Albumin: 4.2 g/dL (ref 3.5–5.2)
Alkaline Phosphatase: 68 U/L (ref 39–117)
BUN: 16 mg/dL (ref 6–23)
CHLORIDE: 104 meq/L (ref 96–112)
CO2: 29 mEq/L (ref 19–32)
Calcium: 10.1 mg/dL (ref 8.4–10.5)
Creat: 1.19 mg/dL — ABNORMAL HIGH (ref 0.50–1.10)
Glucose, Bld: 95 mg/dL (ref 70–99)
Potassium: 4.5 mEq/L (ref 3.5–5.3)
Sodium: 142 mEq/L (ref 135–145)
Total Bilirubin: 0.3 mg/dL (ref 0.2–1.2)
Total Protein: 7.3 g/dL (ref 6.0–8.3)

## 2014-11-09 LAB — POCT SEDIMENTATION RATE: POCT SED RATE: 32 mm/h — AB (ref 0–22)

## 2014-11-09 MED ORDER — CETIRIZINE HCL 10 MG PO TABS: 10.0000 mg | ORAL_TABLET | Freq: Every day | ORAL | Status: DC

## 2014-11-09 MED ORDER — MICONAZOLE NITRATE 2 % EX CREA: 1.0000 "application " | TOPICAL_CREAM | Freq: Two times a day (BID) | CUTANEOUS | Status: DC

## 2014-11-09 NOTE — Progress Notes (Addendum)
Subjective:  This chart was scribed for Delman Cheadle, MD by Donato Schultz, Medical Scribe. This patient was seen in Room 3 and the patient's care was started at 3:39 PM.   Patient ID: Sherri Burns, female    DOB: 1936/11/21, 78 y.o.   MRN: [id number]  Chief Complaint  Patient presents with  . Itching    from the neck and down itching  . Thyroid Problem  . Back Pain    mid-back pain  . Hip Pain    left side pain  . Rash    on both feet  . Foot Swelling    right foot   HPI HPI Comments: Sherri Burns is a 78 y.o. female with a history of type II DM, hypertension, hyperlipidemia, and anxiety who presents to the [facility] complaining of generalized itching that started a week ago.  She is also complaining of constant lower back pain, right foot pain with associated swelling and fungus, and left hip pain that started 3 months ago.  She took Benadryl yesterday with relief to her itching.  She has not used any athlete's foot treatment to her feet.  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  Walking aggravates the hip pain.  She lists intermittent pelvic pain as an associated symptom.  She had a partial hysterectomy in 1978 but she still has ovaries.  She had a pap smear done in the summer but was not complaining of pelvic pain at that time.    She is a patient of Dr. Perfecto Kingdom and was last seen 2 months ago.  Her husband passed away recently and so she is on schedule Klonopin.  She called Dr. Everlene Farrier and told him that she was itching and her shoulders and legs hurt so he instructed her to come into the walk-in clinic to be seen.  She has an appointment scheduled with him in one month.  She has a history of right nephrectomy after being diagnosed with renal cancer in 1988.    Past Medical History  Diagnosis Date  . Hypertension   . GERD (gastroesophageal reflux  disease)     + hpylori  . Hyperlipidemia   . Anxiety   . Cancer 15 yrs ago    ho renal s/p nephrectomy  . Diabetes mellitus   . Elevated TSH   . Elevated serum creatinine   . Ischemic colitis   . Renal insufficiency   . Colon polyps 11/2008    tubular adenoma   Past Surgical History  Procedure Laterality Date  . Nephrectomy      R  . Partial hysterectomy  1978   No family history on file. History   Social History  . Marital Status: Married    Spouse Name: N/A    Number of Children: N/A  . Years of Education: N/A   Occupational History  . Not on file.   Social History Main Topics  . Smoking status: Current Every Day Smoker  . Smokeless tobacco: Never Used     Comment: cut back amount still  trying to quit  . Alcohol Use: No  . Drug Use: No  . Sexual Activity: Not on file   Other Topics Concern  . Not on file   Social History Narrative   Loss of son.   Allergies  Allergen Reactions  . Ciprocin-Fluocin-Procin [  Fluocinolone Acetonide]   . Clonidine Derivatives     Dry mouth  . Codeine     hallucinations  . Penicillins   . Simvastatin     Upset stomach   . Tessalon Perles     Gi upset     Review of Systems  Musculoskeletal: Positive for back pain and arthralgias.  Skin: Negative for rash.    Objective:  BP 144/84 mmHg  Pulse 68  Temp(Src) 98.3 F (36.8 C) (Oral)  Resp 18  Ht 5\' 5"  (1.651 m)  Wt 176 lb (79.833 kg)  BMI 29.29 kg/m2  SpO2 98%  Physical Exam  Constitutional: She is oriented to person, place, and time. She appears well-developed and well-nourished.  HENT:  Head: Normocephalic and atraumatic.  Eyes: EOM are normal.  Neck: Normal range of motion.  Cardiovascular: Normal rate.   Pulmonary/Chest: Effort normal.  Musculoskeletal: Normal range of motion.  Neurological: She is alert and oriented to person, place, and time.  Skin: Skin is warm and dry. No rash noted.  Feet with some hyperpigmented macules about 3-30mm in diameter  over soles and sides of feet with scaling, peeling skin worse on right including over large toenail with toenails thickened, yellow-brown, and splitting.    Psychiatric: She has a normal mood and affect. Her behavior is normal.  Nursing note and vitals reviewed.  Results for orders placed or performed in visit on 11/09/14  POCT CBC  Result Value Ref Range   WBC 7.6 4.6 - 10.2 K/uL   Lymph, poc 3.2 0.6 - 3.4   POC LYMPH PERCENT 41.5 10 - 50 %L   MID (cbc) 0.3 0 - 0.9   POC MID % 4.0 0 - 12 %M   POC Granulocyte 4.1 2 - 6.9   Granulocyte percent 54.5 37 - 80 %G   RBC 4.31 4.04 - 5.48 M/uL   Hemoglobin 13.3 12.2 - 16.2 g/dL   HCT, POC 42.0 37.7 - 47.9 %   MCV 97.6 (A) 80 - 97 fL   MCH, POC 31.0 27 - 31.2 pg   MCHC 31.8 31.8 - 35.4 g/dL   RDW, POC 14.6 %   Platelet Count, POC 188 142 - 424 K/uL   MPV 8.4 0 - 99.8 fL  POCT UA - Microscopic Only  Result Value Ref Range   WBC, Ur, HPF, POC 0-2    RBC, urine, microscopic 0-2    Bacteria, U Microscopic 1+    Mucus, UA positive    Epithelial cells, urine per micros 2-5    Crystals, Ur, HPF, POC neg    Casts, Ur, LPF, POC neg    Yeast, UA neg    Amorphous    POCT urinalysis dipstick  Result Value Ref Range   Color, UA dark yellow    Clarity, UA clear    Glucose, UA neg    Bilirubin, UA small    Ketones, UA neg    Spec Grav, UA 1.025    Blood, UA neg    pH, UA 5.5    Protein, UA trace    Urobilinogen, UA 1.0    Nitrite, UA neg    Leukocytes, UA Negative     Assessment & Plan:  Will obtain a UA to rule out a UTI and blood work.  Will prescribe an antifungal cream to treat for yeast and an allergy pill to treat the itching.  Labs done at her last visit here 2 months ago show signs of chronic renal failure.  GFR was 42 and creatinine was 1.41 which was a little higher than her baseline. Improved today to 1.19.  Hypothyroidism, unspecified hypothyroidism type - Plan: TSH  Itching - Plan: POCT SEDIMENTATION RATE - start zyrtec  instead of allegra and ok to tr prn qhs benadryl - warned of side effects.  ensure not using any soap/detergent/other allergens  Dry skin dermatitis - on back; apply low allergen moisturizer bid and immed after bathing  Tinea pedis of both feet - start topical antifungal x 2-3 wks then reassess  Onychomycosis - consider oral lamisil prn at f/u - try topical vics vaporub  Encounter for medication management - Plan: Comprehensive metabolic panel  Abdominal pain, left lower quadrant - Plan: POCT CBC, POCT UA - Microscopic Only, POCT urinalysis dipstick, Urine culture, Comprehensive metabolic panel - suspect may be due to inguinal hernia but no signs of acute injury w/ incarceration or strangulation so ok to cont watchful waiting for now and if pain recurs/worsens will need to get CT - if any signs of acute abd -> 911 to ER.  Left inguinal hernia  Meds ordered this encounter  Medications  . miconazole (MICATIN) 2 % cream    Sig: Apply 1 application topically 2 (two) times daily.    Dispense:  198 g    Refill:  0  . cetirizine (ZYRTEC) 10 MG tablet    Sig: Take 1 tablet (10 mg total) by mouth at bedtime.    Dispense:  30 tablet    Refill:  1    I personally performed the services described in this documentation, which was scribed in my presence. The recorded information has been reviewed and considered, and addended by me as needed.  Delman Cheadle, MD MPH   Results for orders placed or performed in visit on 11/09/14  Urine culture  Result Value Ref Range   Colony Count NO GROWTH    Organism ID, Bacteria NO GROWTH   Comprehensive metabolic panel  Result Value Ref Range   Sodium 142 135 - 145 mEq/L   Potassium 4.5 3.5 - 5.3 mEq/L   Chloride 104 96 - 112 mEq/L   CO2 29 19 - 32 mEq/L   Glucose, Bld 95 70 - 99 mg/dL   BUN 16 6 - 23 mg/dL   Creat 1.19 (H) 0.50 - 1.10 mg/dL   Total Bilirubin 0.3 0.2 - 1.2 mg/dL   Alkaline Phosphatase 68 39 - 117 U/L   AST 16 0 - 37 U/L   ALT 13 0 - 35  U/L   Total Protein 7.3 6.0 - 8.3 g/dL   Albumin 4.2 3.5 - 5.2 g/dL   Calcium 10.1 8.4 - 10.5 mg/dL  TSH  Result Value Ref Range   TSH 2.010 0.350 - 4.500 uIU/mL  POCT CBC  Result Value Ref Range   WBC 7.6 4.6 - 10.2 K/uL   Lymph, poc 3.2 0.6 - 3.4   POC LYMPH PERCENT 41.5 10 - 50 %L   MID (cbc) 0.3 0 - 0.9   POC MID % 4.0 0 - 12 %M   POC Granulocyte 4.1 2 - 6.9   Granulocyte percent 54.5 37 - 80 %G   RBC 4.31 4.04 - 5.48 M/uL   Hemoglobin 13.3 12.2 - 16.2 g/dL   HCT, POC 42.0 37.7 - 47.9 %   MCV 97.6 (A) 80 - 97 fL   MCH, POC 31.0 27 - 31.2 pg   MCHC 31.8 31.8 - 35.4 g/dL   RDW, POC 14.6 %  Platelet Count, POC 188 142 - 424 K/uL   MPV 8.4 0 - 99.8 fL  POCT SEDIMENTATION RATE  Result Value Ref Range   POCT SED RATE 32 (A) 0 - 22 mm/hr  POCT UA - Microscopic Only  Result Value Ref Range   WBC, Ur, HPF, POC 0-2    RBC, urine, microscopic 0-2    Bacteria, U Microscopic 1+    Mucus, UA positive    Epithelial cells, urine per micros 2-5    Crystals, Ur, HPF, POC neg    Casts, Ur, LPF, POC neg    Yeast, UA neg    Amorphous    POCT urinalysis dipstick  Result Value Ref Range   Color, UA dark yellow    Clarity, UA clear    Glucose, UA neg    Bilirubin, UA small    Ketones, UA neg    Spec Grav, UA 1.025    Blood, UA neg    pH, UA 5.5    Protein, UA trace    Urobilinogen, UA 1.0    Nitrite, UA neg    Leukocytes, UA Negative

## 2014-11-09 NOTE — Patient Instructions (Addendum)
I suspect the skin on your back is itching so much because it is very dry.  Make sure you are applying a thick white lotion as soon as your get out of the shower and another time as well (so twice a day).   I suspect you have a fungal infection on your feet - and possible the palms of your hands as well - so start apply miconazole cream to them twice a day for 2-3 weeks.  If that isn't working well then we could consider putting you on an oral antifungal like terbinafine which would also treat your toenails. Try massaging vics vaporub into your toenails to treat the nail infection 2-3x/d. I am concerned that the pain in your left hip/pelvix area could be from a hernia that you had there that had fat stuck in it which was seen on your CT last year.  If this area ever becomes much more painful to the point where you don't want it touched or to bend over, make sure you come see [place] immediately. Start the cetrizine every night for a week.  Make sure that you haven't recently changed detergents or soaps.  Try only using dove moisturizing soap without scents or color additives.  You can stop the allergra.  If you are still having itching you may take one benadryl but watch out for dry mouth, blurred vision, and constipation.   Athlete's Foot  Athlete's foot is a skin infection caused by a fungus. Athlete's foot is often seen between or under the toes. It can also be seen on the bottom of the foot. Athlete's foot can spread to other people by sharing towels or shower stalls. HOME CARE  Only take medicines as told by your doctor. Do not use steroid creams.  Wash your feet daily. Dry your feet well, especially between the toes.  Change your socks every day. Wear cotton or wool socks.  Change your socks 2 to 3 times a day in hot weather.  Wear sandals or canvas tennis shoes with good airflow.  If you have blisters, soak your feet in a solution as told by your doctor. Do this for 20 to 30 minutes, 2 times a  day. Dry your feet well after you soak them.  Do not share towels.  Wear sandals when you use shared locker rooms or showers. GET HELP RIGHT AWAY IF:   You have a fever.  Your foot is puffy (swollen), sore, warm, or red.  You are not getting better after 7 days of treatment.  You still have athlete's foot after 30 days.  You have problems caused by your medicine. MAKE SURE YOU:   Understand these instructions.  Will watch your condition.  Will get help right away if you are not doing well or get worse. Document Released: 04/30/2008 Document Revised: 02/04/2012 Document Reviewed: 08/31/2011 [facility] Patient Information 2015 [place], [place]. This information is not intended to replace advice given to you by your health care provider. Make sure you discuss any questions you have with your health care provider.  Onychomycosis/Fungal Toenails  WHAT IS IT? An infection that lies within the keratin of your nail plate that is caused by a fungus.  WHY ME? Fungal infections affect all ages, sexes, races, and creeds.  There may be many factors that predispose you to a fungal infection such as age, coexisting medical conditions such as diabetes, or an autoimmune disease; stress, medications, fatigue, genetics, etc.  Bottom line: fungus thrives in a warm, moist  environment and your shoes offer such a location.  IS IT CONTAGIOUS? Theoretically, yes.  You do not want to share shoes, nail clippers or files with someone who has fungal toenails.  Walking around barefoot in the same room or sleeping in the same bed is unlikely to transfer the organism.  It is important to realize, however, that fungus can spread easily from one nail to the next on the same foot.  HOW DO WE TREAT THIS?  There are several ways to treat this condition.  Treatment may depend on many factors such as age, medications, pregnancy, liver and kidney conditions, etc.  It is best to ask your doctor which options are available to  you.   No treatment.   Unlike many other medical concerns, you can live with this condition.  However for many people this can be a painful condition and may lead to ingrown toenails or a bacterial infection.  It is recommended that you keep the nails cut short to help reduce the amount of fungal nail.  Topical treatment.  These range from herbal remedies to prescription strength nail lacquers.  About 40-50% effective, topicals require twice daily application for approximately 9 to 12 months or until an entirely new nail has grown out.  The most effective topicals are medical grade medications available through physicians offices.  Oral antifungal medications.  With an 80-90% cure rate, the most common oral medication requires 3 to 4 months of therapy and stays in your system for a year as the new nail grows out.  Oral antifungal medications do require blood work to make sure it is a safe drug for you.  A liver function panel will be performed prior to starting the medication and after the first month of treatment.  It is important to have the blood work performed to avoid any harmful side effects.  In general, this medication safe but blood work is required.  Laser Therapy.  This treatment is performed by applying a specialized laser to the affected nail plate.  This therapy is noninvasive, fast, and non-painful.  It is not covered by insurance and is therefore, out of pocket.  The results have been very good with a 80-95% cure rate.  The [facility] is the only practice in the area to offer this therapy.  Permanent Nail Avulsion.  Removing the entire nail so that a new nail will not grow back. Inguinal Hernia, Adult Muscles help keep everything in the body in its proper place. But if a weak spot in the muscles develops, something can poke through. That is called a hernia. When this happens in the lower part of the belly (abdomen), it is called an inguinal hernia. (It takes its name from a part  of the body in this region called the inguinal canal.) A weak spot in the wall of muscles lets some fat or part of the small intestine bulge through. An inguinal hernia can develop at any age. Men get them more often than women. CAUSES  In adults, an inguinal hernia develops over time.  It can be triggered by:  Suddenly straining the muscles of the lower abdomen.  Lifting heavy objects.  Straining to have a bowel movement. Difficult bowel movements (constipation) can lead to this.  Constant coughing. This may be caused by smoking or lung disease.  Being overweight.  Being pregnant.  Working at a job that requires long periods of standing or heavy lifting.  Having had an inguinal hernia before. One  type can be an emergency situation. It is called a strangulated inguinal hernia. It develops if part of the small intestine slips through the weak spot and cannot get back into the abdomen. The blood supply can be cut off. If that happens, part of the intestine may die. This situation requires emergency surgery. SYMPTOMS  Often, a small inguinal hernia has no symptoms. It is found when a healthcare provider does a physical exam. Larger hernias usually have symptoms.   In adults, symptoms may include:  A lump in the groin. This is easier to see when the person is standing. It might disappear when lying down.  In men, a lump in the scrotum.  Pain or burning in the groin. This occurs especially when lifting, straining or coughing.  A dull ache or feeling of pressure in the groin.  Signs of a strangulated hernia can include:  A bulge in the groin that becomes very painful and tender to the touch.  A bulge that turns red or purple.  Fever, nausea and vomiting.  Inability to have a bowel movement or to pass gas. DIAGNOSIS  To decide if you have an inguinal hernia, a healthcare provider will probably do a physical examination.  This will include asking questions about any symptoms  you have noticed.  The healthcare provider might feel the groin area and ask you to cough. If an inguinal hernia is felt, the healthcare provider may try to slide it back into the abdomen.  Usually no other tests are needed. TREATMENT  Treatments can vary. The size of the hernia makes a difference. Options include:  Watchful waiting. This is often suggested if the hernia is small and you have had no symptoms.  No medical procedure will be done unless symptoms develop.  You will need to watch closely for symptoms. If any occur, contact your healthcare provider right away.  Surgery. This is used if the hernia is larger or you have symptoms.  Open surgery. This is usually an outpatient procedure (you will not stay overnight in a hospital). An cut (incision) is made through the skin in the groin. The hernia is put back inside the abdomen. The weak area in the muscles is then repaired by herniorrhaphy or hernioplasty. Herniorrhaphy: in this type of surgery, the weak muscles are sewn back together. Hernioplasty: a patch or mesh is used to close the weak area in the abdominal wall.  Laparoscopy. In this procedure, a surgeon makes small incisions. A thin tube with a tiny video camera (called a laparoscope) is put into the abdomen. The surgeon repairs the hernia with mesh by looking with the video camera and using two long instruments. HOME CARE INSTRUCTIONS   After surgery to repair an inguinal hernia:  You will need to take pain medicine prescribed by your healthcare provider. Follow all directions carefully.  You will need to take care of the wound from the incision.  Your activity will be restricted for awhile. This will probably include no heavy lifting for several weeks. You also should not do anything too active for a few weeks. When you can return to work will depend on the type of job that you have.  During "watchful waiting" periods, you should:  Maintain a healthy weight.  Eat a  diet high in fiber (fruits, vegetables and whole grains).  Drink plenty of fluids to avoid constipation. This means drinking enough water and other liquids to keep your urine clear or pale yellow.  Do not lift heavy  objects.  Do not stand for long periods of time.  Quit smoking. This should keep you from developing a frequent cough. SEEK MEDICAL CARE IF:   A bulge develops in your groin area.  You feel pain, a burning sensation or pressure in the groin. This might be worse if you are lifting or straining.  You develop a fever of more than 100.5 F (38.1 C). SEEK IMMEDIATE MEDICAL CARE IF:   Pain in the groin increases suddenly.  A bulge in the groin gets bigger suddenly and does not go down.  For men, there is sudden pain in the scrotum. Or, the size of the scrotum increases.  A bulge in the groin area becomes red or purple and is painful to touch.  You have nausea or vomiting that does not go away.  You feel your heart beating much faster than normal.  You cannot have a bowel movement or pass gas.  You develop a fever of more than 102.0 F (38.9 C). Document Released: 03/31/2009 Document Revised: 02/04/2012 Document Reviewed: 03/31/2009 [facility] Patient Information 2015 [place], [place]. This information is not intended to replace advice given to you by your health care provider. Make sure you discuss any questions you have with your health care provider.

## 2014-11-09 NOTE — Telephone Encounter (Signed)
Dr Everlene Farrier has spoken to patient today, she is having some itching, and shoulder pain, legs painful. She has seen cardiologist already. Dr Everlene Farrier advised patient to go to the urgent care to see one of the doctors there urgently, then she will follow up with Dr Everlene Farrier on Thursday.

## 2014-11-09 NOTE — Telephone Encounter (Signed)
Called patient, to follow up and see if she plans to come to urgent care, she indicates she is waiting on her transportation, her sister in law will drive her in later today.

## 2014-11-10 ENCOUNTER — Encounter: Payer: Self-pay | Admitting: Family Medicine

## 2014-11-10 LAB — TSH: TSH: 2.01 u[IU]/mL (ref 0.350–4.500)

## 2014-11-11 LAB — URINE CULTURE
Colony Count: NO GROWTH
ORGANISM ID, BACTERIA: NO GROWTH

## 2014-11-26 HISTORY — PX: ESOPHAGEAL DILATION: SHX303

## 2014-11-30 ENCOUNTER — Other Ambulatory Visit: Payer: Self-pay

## 2014-11-30 MED ORDER — GLIPIZIDE ER 10 MG PO TB24: ORAL_TABLET | ORAL | Status: DC

## 2014-12-07 ENCOUNTER — Ambulatory Visit: Payer: Medicare Other | Admitting: Emergency Medicine

## 2015-01-25 ENCOUNTER — Encounter: Payer: Self-pay | Admitting: *Deleted

## 2015-01-29 DIAGNOSIS — E119 Type 2 diabetes mellitus without complications: Secondary | ICD-10-CM | POA: Diagnosis not present

## 2015-03-01 ENCOUNTER — Other Ambulatory Visit: Payer: Self-pay | Admitting: Family Medicine

## 2015-03-01 ENCOUNTER — Other Ambulatory Visit: Payer: Self-pay | Admitting: Emergency Medicine

## 2015-03-02 NOTE — Telephone Encounter (Signed)
Do you want to give pt RFs of this?

## 2015-03-11 DIAGNOSIS — K219 Gastro-esophageal reflux disease without esophagitis: Secondary | ICD-10-CM | POA: Diagnosis not present

## 2015-03-28 DIAGNOSIS — Z86010 Personal history of colonic polyps: Secondary | ICD-10-CM | POA: Diagnosis not present

## 2015-03-28 DIAGNOSIS — K5730 Diverticulosis of large intestine without perforation or abscess without bleeding: Secondary | ICD-10-CM | POA: Diagnosis not present

## 2015-03-28 DIAGNOSIS — K641 Second degree hemorrhoids: Secondary | ICD-10-CM | POA: Diagnosis not present

## 2015-03-28 DIAGNOSIS — R1310 Dysphagia, unspecified: Secondary | ICD-10-CM | POA: Diagnosis not present

## 2015-03-28 DIAGNOSIS — D125 Benign neoplasm of sigmoid colon: Secondary | ICD-10-CM | POA: Diagnosis not present

## 2015-03-28 DIAGNOSIS — Z800 Family history of malignant neoplasm of digestive organs: Secondary | ICD-10-CM | POA: Diagnosis not present

## 2015-03-28 DIAGNOSIS — K317 Polyp of stomach and duodenum: Secondary | ICD-10-CM | POA: Diagnosis not present

## 2015-03-28 DIAGNOSIS — K5900 Constipation, unspecified: Secondary | ICD-10-CM | POA: Diagnosis not present

## 2015-03-28 DIAGNOSIS — D131 Benign neoplasm of stomach: Secondary | ICD-10-CM | POA: Diagnosis not present

## 2015-03-28 DIAGNOSIS — K2970 Gastritis, unspecified, without bleeding: Secondary | ICD-10-CM | POA: Diagnosis not present

## 2015-03-28 DIAGNOSIS — K2950 Unspecified chronic gastritis without bleeding: Secondary | ICD-10-CM | POA: Diagnosis not present

## 2015-03-28 DIAGNOSIS — E278 Other specified disorders of adrenal gland: Secondary | ICD-10-CM | POA: Diagnosis not present

## 2015-04-01 ENCOUNTER — Other Ambulatory Visit: Payer: Self-pay | Admitting: Emergency Medicine

## 2015-04-04 NOTE — Telephone Encounter (Signed)
Faxed

## 2015-04-27 ENCOUNTER — Other Ambulatory Visit: Payer: Self-pay | Admitting: Emergency Medicine

## 2015-04-27 ENCOUNTER — Ambulatory Visit (INDEPENDENT_AMBULATORY_CARE_PROVIDER_SITE_OTHER): Payer: Medicare Other | Admitting: Emergency Medicine

## 2015-04-27 VITALS — BP 128/82 | HR 73 | Temp 98.7°F | Resp 17 | Ht 65.5 in | Wt 179.0 lb

## 2015-04-27 DIAGNOSIS — F329 Major depressive disorder, single episode, unspecified: Secondary | ICD-10-CM | POA: Diagnosis not present

## 2015-04-27 DIAGNOSIS — E039 Hypothyroidism, unspecified: Secondary | ICD-10-CM

## 2015-04-27 DIAGNOSIS — I10 Essential (primary) hypertension: Secondary | ICD-10-CM

## 2015-04-27 DIAGNOSIS — E785 Hyperlipidemia, unspecified: Secondary | ICD-10-CM | POA: Diagnosis not present

## 2015-04-27 DIAGNOSIS — F32A Depression, unspecified: Secondary | ICD-10-CM

## 2015-04-27 DIAGNOSIS — E119 Type 2 diabetes mellitus without complications: Secondary | ICD-10-CM

## 2015-04-27 LAB — COMPLETE METABOLIC PANEL WITH GFR
ALT: 16 U/L (ref 0–35)
AST: 16 U/L (ref 0–37)
Albumin: 4 g/dL (ref 3.5–5.2)
Alkaline Phosphatase: 71 U/L (ref 39–117)
BUN: 18 mg/dL (ref 6–23)
CO2: 31 mEq/L (ref 19–32)
Calcium: 9.5 mg/dL (ref 8.4–10.5)
Chloride: 102 mEq/L (ref 96–112)
Creat: 1.44 mg/dL — ABNORMAL HIGH (ref 0.50–1.10)
GFR, EST NON AFRICAN AMERICAN: 35 mL/min — AB
GFR, Est African American: 41 mL/min — ABNORMAL LOW
Glucose, Bld: 184 mg/dL — ABNORMAL HIGH (ref 70–99)
POTASSIUM: 4.5 meq/L (ref 3.5–5.3)
Sodium: 140 mEq/L (ref 135–145)
Total Bilirubin: 0.7 mg/dL (ref 0.2–1.2)
Total Protein: 7 g/dL (ref 6.0–8.3)

## 2015-04-27 LAB — LIPID PANEL
Cholesterol: 248 mg/dL — ABNORMAL HIGH (ref 0–200)
HDL: 33 mg/dL — ABNORMAL LOW (ref >=46)
LDL Cholesterol: 157 mg/dL — ABNORMAL HIGH (ref 0–99)
Total CHOL/HDL Ratio: 7.5 Ratio
Triglycerides: 292 mg/dL — ABNORMAL HIGH (ref <150)
VLDL: 58 mg/dL — ABNORMAL HIGH (ref 0–40)

## 2015-04-27 LAB — POCT CBC
GRANULOCYTE PERCENT: 64.7 % (ref 37–80)
HCT, POC: 41 % (ref 37.7–47.9)
Hemoglobin: 12.8 g/dL (ref 12.2–16.2)
LYMPH, POC: 2.3 (ref 0.6–3.4)
MCH, POC: 29.7 pg (ref 27–31.2)
MCHC: 31.1 g/dL — AB (ref 31.8–35.4)
MCV: 95.5 fL (ref 80–97)
MID (CBC): 0.3 (ref 0–0.9)
MPV: 8.1 fL (ref 0–99.8)
PLATELET COUNT, POC: 231 10*3/uL (ref 142–424)
POC GRANULOCYTE: 4.8 (ref 2–6.9)
POC LYMPH PERCENT: 31.4 %L (ref 10–50)
POC MID %: 3.9 % (ref 0–12)
RBC: 4.3 M/uL (ref 4.04–5.48)
RDW, POC: 14.7 %
WBC: 7.4 10*3/uL (ref 4.6–10.2)

## 2015-04-27 LAB — GLUCOSE, POCT (MANUAL RESULT ENTRY): POC GLUCOSE: 140 mg/dL — AB (ref 70–99)

## 2015-04-27 LAB — POCT GLYCOSYLATED HEMOGLOBIN (HGB A1C): Hemoglobin A1C: 7.9

## 2015-04-27 LAB — TSH: TSH: 1.069 u[IU]/mL (ref 0.350–4.500)

## 2015-04-27 MED ORDER — METFORMIN HCL ER 500 MG PO TB24: ORAL_TABLET | ORAL | Status: DC

## 2015-04-27 NOTE — Patient Instructions (Signed)

## 2015-04-27 NOTE — Progress Notes (Addendum)
Subjective:    Patient ID: Sherri Burns, female    DOB: 01-27-1936, 79 y.o.   MRN: [id number] This chart was scribed for [name], MD by Martinique Peace, ED Scribe. The patient was seen in RM03. The patient's care was started at 10:19 AM.  Chief Complaint  Patient presents with  . Weight Loss  . Anorexia  . Anxiety     HPI  HPI Comments: Sherri Burns is a 79 y.o. female who presents to the [facility] complaining of anxiety, weight loss, appetite change, and depression stemming from her husband's death due to pancreatic cancer. Pt explains that she has been having some issues at home with some of her appliances that had broken down and cost her a lot money. She reports she got so upset that she decided to put the house up for sale. She states she has been having trouble selling the house because of the large amount of land that it sits on. Pt states she just decided to stay in the 8 bedroom house that she currently lives in by herself. History of Cancer and DM.   Pt adds that she has been requested to participate in jury duty but explains she can't do it, stating "my nerves cannot take it".    Past Medical History  Diagnosis Date  . Hypertension   . GERD (gastroesophageal reflux disease)     + hpylori  . Hyperlipidemia   . Anxiety   . Cancer 15 yrs ago    ho renal s/p nephrectomy  . Diabetes mellitus   . Elevated TSH   . Elevated serum creatinine   . Ischemic colitis   . Renal insufficiency   . Colon polyps 11/2008    tubular adenoma   Past Surgical History  Procedure Laterality Date  . Nephrectomy      R  . Partial hysterectomy  1978   Allergies  Allergen Reactions  . Ciprocin-Fluocin-Procin [Fluocinolone Acetonide]   . Clonidine Derivatives     Dry mouth  . Codeine     hallucinations  . Penicillins   . Simvastatin     Upset stomach   . Tessalon Perles     Gi upset    Current Outpatient Prescriptions on File Prior to Visit  Medication Sig Dispense  Refill  . aspirin 81 MG tablet Take 81 mg by mouth daily.    . Blood Glucose Monitoring Suppl (BLOOD GLUCOSE MONITOR KIT) KIT Use to test blood sugar once daily. Dx code: 250.00. 1 each 0  . cetirizine (ZYRTEC) 10 MG tablet Take 1 tablet (10 mg total) by mouth at bedtime. 30 tablet 1  . Cholecalciferol (VITAMIN D-3 PO) Take 1 tablet by mouth daily.    . clonazePAM (KLONOPIN) 1 MG tablet TAKE 1 TABLET BY MOUTH TWICE DAILY,MAY TAKE 1/2 TABLET IF UNDER STRESS DURING DAY 75 tablet 0  . diltiazem (CARTIA XT) 240 MG 24 hr capsule TAKE ONE CAPSULE BY MOUTH EVERY DAY 30 capsule 11  . fluticasone (FLONASE) 50 MCG/ACT nasal spray USE 2 SPRAYS IN EACH NOSTRIL EVERY DAY 16 g 10  . furosemide (LASIX) 40 MG tablet Take 1 tablet by mouth every day 30 tablet 0  . furosemide (LASIX) 40 MG tablet TAKE 1 TABLET BY MOUTH EVERY DAY 30 tablet 4  . glipiZIDE (GLUCOTROL XL) 10 MG 24 hr tablet TAKE 1 TABLET (10 MG) BY MOUTH TWICE DAILY.  "OV NEEDED FOR FURTHER REFILLS" 60 tablet 0  . glucose blood test strip  Use to test blood sugar once daily. Dx code: 250.00. 100 each 1  . levothyroxine (SYNTHROID, LEVOTHROID) 75 MCG tablet TAKE 1 TABLET BY MOUTH EVERY DAY ON AN EMPTY STOMACH.  "OV NEEDED FOR FURTHER REFILLS" 30 tablet 0  . meloxicam (MOBIC) 7.5 MG tablet Take 1 tablet (7.5 mg total) by mouth daily. 30 tablet 0  . metoprolol (LOPRESSOR) 100 MG tablet TAKE 1 TABLET BY MOUTH TWICE DAILY.  "OV NEEDED FOR FURTHER REFILLS" 60 tablet 0  . miconazole (MICATIN) 2 % cream Apply 1 application topically 2 (two) times daily. 198 g 0  . mupirocin ointment (BACTROBAN) 2 % APPLY TO THE AREAS AROUND THE BASE OF THE GREAT TOENAIL TWICE DAILY 22 g 2  . rosuvastatin (CRESTOR) 5 MG tablet Take 1 tablet (5 mg total) by mouth daily. 30 tablet 11  . traMADol (ULTRAM) 50 MG tablet Take 1 tablet (50 mg total) by mouth every 6 (six) hours as needed. 30 tablet 2  . triamcinolone cream (KENALOG) 0.1 % Apply 3 times a day as instructed to 480 g 2    . vitamin E 100 UNIT capsule Take 100 Units by mouth daily.     No current facility-administered medications on file prior to visit.       Review of Systems  Constitutional: Positive for appetite change and unexpected weight change.  Skin: Positive for rash.       Rash to back of right shoulder.   Psychiatric/Behavioral: The patient is nervous/anxious.        Objective:   Physical Exam  Constitutional: She is oriented to person, place, and time. She appears well-developed and well-nourished. No distress.  HENT:  Head: Normocephalic and atraumatic.  Eyes: Conjunctivae and EOM are normal.  Neck: Neck supple. No tracheal deviation present.  Cardiovascular: Normal rate, regular rhythm and normal heart sounds.  Exam reveals no gallop and no friction rub.   No murmur heard. Pulmonary/Chest: Effort normal and breath sounds normal. No respiratory distress. She has no wheezes. She has no rales.  Musculoskeletal: Normal range of motion.  Neurological: She is alert and oriented to person, place, and time.  Skin: Skin is warm and dry. Rash noted.  Dry scaly on palms of both hands. Circular pigmented non-scaly area of posterior aspect of right shoulder.    Psychiatric: She has a normal mood and affect. Her behavior is normal.  Nursing note and vitals reviewed.  Filed Vitals:   04/27/15 0938  BP: 128/82  Pulse: 73  Temp: 98.7 F (37.1 C)  Resp: 17   Results for orders placed or performed in visit on 04/27/15  POCT CBC  Result Value Ref Range   WBC 7.4 4.6 - 10.2 K/uL   Lymph, poc 2.3 0.6 - 3.4   POC LYMPH PERCENT 31.4 10 - 50 %L   MID (cbc) 0.3 0 - 0.9   POC MID % 3.9 0 - 12 %M   POC Granulocyte 4.8 2 - 6.9   Granulocyte percent 64.7 37 - 80 %G   RBC 4.30 4.04 - 5.48 M/uL   Hemoglobin 12.8 12.2 - 16.2 g/dL   HCT, POC 41.0 37.7 - 47.9 %   MCV 95.5 80 - 97 fL   MCH, POC 29.7 27 - 31.2 pg   MCHC 31.1 (A) 31.8 - 35.4 g/dL   RDW, POC 14.7 %   Platelet Count, POC 231 142 -  424 K/uL   MPV 8.1 0 - 99.8 fL  POCT glucose (manual entry)  Result Value Ref Range   POC Glucose 140 (A) 70 - 99 mg/dl  POCT glycosylated hemoglobin (Hb A1C)  Result Value Ref Range   Hemoglobin A1C 7.9    Meds ordered this encounter  Medications  . omeprazole (PRILOSEC) 40 MG capsule    Sig: Take 40 mg by mouth daily.  . metFORMIN (GLUCOPHAGE XR) 500 MG 24 hr tablet    Sig: Take 1 tablet a day for the first 2 weeks and if tolerated from a GI standpoint increase to 2 tablets daily    Dispense:  60 tablet    Refill:  11    10:26 AM- Treatment plan was discussed with patient who verbalizes understanding and agrees.       Assessment & Plan:  1. Hypothyroidism, unspecified hypothyroidism type  - TSH  2. Type 2 diabetes mellitus without complication  - POCT glucose (manual entry) - POCT glycosylated hemoglobin (Hb A1C) Start metformin extended release 500 mg 1 a day for the first 2 weeks then 2 daily 3. Hyperlipidemia  - Lipid panel  4. Essential hypertension  - POCT CBC - COMPLETE METABOLIC PANEL WITH GFR Blood pressure is at goal 5. Depression She seems to do well since her husband died. She is getting along better with her stepdaughters. She is deciding whether she wants to sell her home or night.   I personally performed the services described in this documentation, which was scribed in my presence. The recorded information has been reviewed and is accurate.  Arlyss Queen, MD  [facility]  04/27/2015 2:40 PM

## 2015-04-28 ENCOUNTER — Telehealth: Payer: Self-pay | Admitting: Family Medicine

## 2015-04-28 MED ORDER — ROSUVASTATIN CALCIUM 5 MG PO TABS: 5.0000 mg | ORAL_TABLET | Freq: Every day | ORAL | Status: DC

## 2015-04-28 NOTE — Addendum Note (Signed)
Addended by: Arlyss Queen A on: 04/28/2015 11:05 AM   Modules accepted: Orders

## 2015-04-28 NOTE — Telephone Encounter (Signed)
Spoke with patient she need refill on crestor

## 2015-04-30 ENCOUNTER — Other Ambulatory Visit: Payer: Self-pay | Admitting: Emergency Medicine

## 2015-04-30 DIAGNOSIS — E119 Type 2 diabetes mellitus without complications: Secondary | ICD-10-CM | POA: Diagnosis not present

## 2015-05-04 ENCOUNTER — Other Ambulatory Visit: Payer: Self-pay | Admitting: Emergency Medicine

## 2015-05-04 ENCOUNTER — Other Ambulatory Visit: Payer: Self-pay | Admitting: Physician Assistant

## 2015-05-05 ENCOUNTER — Other Ambulatory Visit: Payer: Self-pay | Admitting: Emergency Medicine

## 2015-05-11 ENCOUNTER — Telehealth: Payer: Self-pay

## 2015-05-11 ENCOUNTER — Other Ambulatory Visit: Payer: Self-pay | Admitting: Radiology

## 2015-05-11 ENCOUNTER — Other Ambulatory Visit: Payer: Self-pay | Admitting: Emergency Medicine

## 2015-05-11 MED ORDER — GLIPIZIDE ER 10 MG PO TB24: ORAL_TABLET | ORAL | Status: DC

## 2015-05-11 NOTE — Telephone Encounter (Signed)
Rx sent. Called pt to let her know. Left message.

## 2015-05-11 NOTE — Telephone Encounter (Signed)
Patient is calling to request a refill for glipizide. She states that the pharmacy sent a fax and hasn't heard anything in the last couple of days.

## 2015-06-06 ENCOUNTER — Other Ambulatory Visit: Payer: Self-pay | Admitting: Emergency Medicine

## 2015-06-07 ENCOUNTER — Other Ambulatory Visit: Payer: Self-pay

## 2015-06-07 MED ORDER — GLIPIZIDE ER 10 MG PO TB24: ORAL_TABLET | ORAL | Status: DC

## 2015-07-10 ENCOUNTER — Other Ambulatory Visit: Payer: Self-pay | Admitting: *Deleted

## 2015-07-10 ENCOUNTER — Telehealth: Payer: Self-pay | Admitting: *Deleted

## 2015-07-10 MED ORDER — LEVOTHYROXINE SODIUM 75 MCG PO TABS: ORAL_TABLET | ORAL | Status: DC

## 2015-07-10 NOTE — Telephone Encounter (Signed)
Medication for this pt was called in per Dr. Everlene Farrier for levothyroxine (SYNTHROID, LEVOTHROID) 75 MCG tablet.  Medication was sent over to [facility] [place].

## 2015-07-13 ENCOUNTER — Other Ambulatory Visit: Payer: Self-pay | Admitting: Emergency Medicine

## 2015-07-14 NOTE — Telephone Encounter (Signed)
Rx faxed

## 2015-07-28 ENCOUNTER — Ambulatory Visit (INDEPENDENT_AMBULATORY_CARE_PROVIDER_SITE_OTHER): Payer: Medicare Other | Admitting: Emergency Medicine

## 2015-07-28 ENCOUNTER — Encounter: Payer: Self-pay | Admitting: Emergency Medicine

## 2015-07-28 VITALS — BP 179/91 | HR 67 | Temp 98.4°F | Resp 18 | Ht 65.0 in | Wt 178.0 lb

## 2015-07-28 DIAGNOSIS — E119 Type 2 diabetes mellitus without complications: Secondary | ICD-10-CM

## 2015-07-28 DIAGNOSIS — I10 Essential (primary) hypertension: Secondary | ICD-10-CM | POA: Diagnosis not present

## 2015-07-28 DIAGNOSIS — E785 Hyperlipidemia, unspecified: Secondary | ICD-10-CM | POA: Diagnosis not present

## 2015-07-28 DIAGNOSIS — Z23 Encounter for immunization: Secondary | ICD-10-CM

## 2015-07-28 LAB — LIPID PANEL
CHOL/HDL RATIO: 6.3 ratio — AB (ref <=5.0)
Cholesterol: 238 mg/dL — ABNORMAL HIGH (ref 125–200)
HDL: 38 mg/dL — ABNORMAL LOW (ref >=46)
LDL CALC: 142 mg/dL — AB (ref <130)
TRIGLYCERIDES: 291 mg/dL — AB (ref <150)
VLDL: 58 mg/dL — AB (ref <30)

## 2015-07-28 LAB — BASIC METABOLIC PANEL WITH GFR
BUN: 18 mg/dL (ref 7–25)
CHLORIDE: 101 mmol/L (ref 98–110)
CO2: 29 mmol/L (ref 20–31)
CREATININE: 1.26 mg/dL — AB (ref 0.60–0.93)
Calcium: 9.5 mg/dL (ref 8.6–10.4)
GFR, Est African American: 48 mL/min — ABNORMAL LOW (ref >=60)
GFR, Est Non African American: 41 mL/min — ABNORMAL LOW (ref >=60)
Glucose, Bld: 178 mg/dL — ABNORMAL HIGH (ref 65–99)
Potassium: 4.3 mmol/L (ref 3.5–5.3)
SODIUM: 140 mmol/L (ref 135–146)

## 2015-07-28 LAB — CBC WITH DIFFERENTIAL/PLATELET
BASOS PCT: 1 % (ref 0–1)
Basophils Absolute: 0.1 10*3/uL (ref 0.0–0.1)
Eosinophils Absolute: 0.3 10*3/uL (ref 0.0–0.7)
Eosinophils Relative: 4 % (ref 0–5)
HCT: 39.4 % (ref 36.0–46.0)
HEMOGLOBIN: 13.2 g/dL (ref 12.0–15.0)
Lymphocytes Relative: 32 % (ref 12–46)
Lymphs Abs: 2.4 10*3/uL (ref 0.7–4.0)
MCH: 31.7 pg (ref 26.0–34.0)
MCHC: 33.5 g/dL (ref 30.0–36.0)
MCV: 94.5 fL (ref 78.0–100.0)
MONOS PCT: 6 % (ref 3–12)
MPV: 11.2 fL (ref 8.6–12.4)
Monocytes Absolute: 0.4 10*3/uL (ref 0.1–1.0)
NEUTROS ABS: 4.2 10*3/uL (ref 1.7–7.7)
NEUTROS PCT: 57 % (ref 43–77)
Platelets: 207 10*3/uL (ref 150–400)
RBC: 4.17 MIL/uL (ref 3.87–5.11)
RDW: 14.5 % (ref 11.5–15.5)
WBC: 7.4 10*3/uL (ref 4.0–10.5)

## 2015-07-28 LAB — GLUCOSE, POCT (MANUAL RESULT ENTRY): POC Glucose: 168 mg/dl — AB (ref 70–99)

## 2015-07-28 LAB — POCT GLYCOSYLATED HEMOGLOBIN (HGB A1C): Hemoglobin A1C: 7.9

## 2015-07-28 LAB — HEMOGLOBIN A1C: HEMOGLOBIN A1C: 7.9 % — AB (ref 4.0–6.0)

## 2015-07-28 MED ORDER — ZOSTER VACCINE LIVE 19400 UNT/0.65ML ~~LOC~~ SOLR: 0.6500 mL | Freq: Once | SUBCUTANEOUS | Status: DC

## 2015-07-28 MED ORDER — LOSARTAN POTASSIUM 25 MG PO TABS: ORAL_TABLET | ORAL | Status: DC

## 2015-07-28 NOTE — Progress Notes (Addendum)
Patient ID: [name], female   DOB: 02/04/1936, 79 y.o.   MRN: [id number]     This chart was scribed for Arlyss Queen, MD by Zola Button, Medical Scribe. This patient was seen in room 24 and the patient's care was started at 10:39 AM.   Chief Complaint:  Chief Complaint  Patient presents with  . Hypertension  . Diabetes  . Back Pain    HPI: Sherri Burns is a 79 y.o. female with a history of DM, hypertension, and hyperlipidemia who reports to [facility] today for a follow-up. Patient states she has been taking her medications.  Patient states she has had more depression. She recently lost her husband to pancreatic cancer. She is also bitter because her husband did not leave anything behind in her name and left most of the things for his children. She is living alone currently. Her grandchildren come over to visit every 2 weeks. She has not thought about suicide because her son had committed suicide. She has been dealing with financial issues related to her house. Patient sometimes confides in her siblings; her brother and one of her sisters are ministers.  She has not had the Prevnar yet.  Past Medical History  Diagnosis Date  . Hypertension   . GERD (gastroesophageal reflux disease)     + hpylori  . Hyperlipidemia   . Anxiety   . Cancer 15 yrs ago    ho renal s/p nephrectomy  . Diabetes mellitus   . Elevated TSH   . Elevated serum creatinine   . Ischemic colitis   . Renal insufficiency   . Colon polyps 11/2008    tubular adenoma   Past Surgical History  Procedure Laterality Date  . Nephrectomy      R  . Partial hysterectomy  1978   Social History   Social History  . Marital Status: Married    Spouse Name: N/A  . Number of Children: N/A  . Years of Education: N/A   Social History Main Topics  . Smoking status: Current Every Day Smoker -- 0.50 packs/day for 55 years    Types: Cigarettes  . Smokeless tobacco: Never Used     Comment: cut back amount still   trying to quit  . Alcohol Use: No  . Drug Use: No  . Sexual Activity: Yes   Other Topics Concern  . None   Social History Narrative   Loss of son.   Family History  Problem Relation Age of Onset  . Cancer Mother     colon, kidney cancer  . Cancer Father     throat cancer   Allergies  Allergen Reactions  . Ciprocin-Fluocin-Procin [Fluocinolone Acetonide]   . Clonidine Derivatives     Dry mouth  . Codeine     hallucinations  . Crestor [Rosuvastatin Calcium]     cramps  . Penicillins   . Simvastatin     Upset stomach   . Tessalon Perles     Gi upset    Prior to Admission medications   Medication Sig Start Date End Date Taking? Authorizing Provider  aspirin 81 MG tablet Take 81 mg by mouth daily.    Historical Provider, MD  Blood Glucose Monitoring Suppl (BLOOD GLUCOSE MONITOR KIT) KIT Use to test blood sugar once daily. Dx code: 250.00. 11/09/13   Mancel Bale, PA-C  cetirizine (ZYRTEC) 10 MG tablet Take 1 tablet (10 mg total) by mouth at bedtime. 11/09/14   Shawnee Knapp, MD  Cholecalciferol (VITAMIN D-3 PO) Take 1 tablet by mouth daily.    Historical Provider, MD  clonazePAM (KLONOPIN) 1 MG tablet TAKE 1 TABLET BY MOUTH TWICE DAILY, MAY TAKE 1/2 TABLET IF UNDER STRESS DURING THE DAY 07/14/15   [name], MD  diltiazem (CARTIA XT) 240 MG 24 hr capsule TAKE ONE CAPSULE BY MOUTH EVERY DAY 09/07/14   [name], MD  fluticasone [facility]) 50 MCG/ACT nasal spray USE 2 SPRAYS IN [facility] NOSTRIL EVERY DAY 10/11/14   [name], MD  furosemide (LASIX) 40 MG tablet Take 1 tablet by mouth every day 10/08/14   Harrison Mons, PA-C  furosemide (LASIX) 40 MG tablet TAKE 1 TABLET BY MOUTH EVERY DAY 05/02/15   [name], MD  glipiZIDE (GLUCOTROL XL) 10 MG 24 hr tablet TAKE 1 TABLET BY MOUTH TWICE DAILY 06/07/15   [name], MD  glucose blood test strip Use to test blood sugar once daily. Dx code: 250.00. 03/03/14   [name], MD  levothyroxine (SYNTHROID, LEVOTHROID) 75 MCG  tablet TAKE 1 TABLET BY MOUTH EVERY DAY ON AN EMPTY STOMACH...OFFICE VISIT NEEDED FOR REFILLS 07/10/15   [name], MD  meloxicam (MOBIC) 7.5 MG tablet Take 1 tablet (7.5 mg total) by mouth daily. 09/07/14   Todd McVeigh, PA  metFORMIN (GLUCOPHAGE-XR) 500 MG 24 hr tablet TAKE 1 TABLET BY MOUTH A DAY FOR THE FIRST 2 WEEKS AND IF TOLERATED FROM A GI STANDPOINT INCREASE TO 2 TABLETS DAILY 04/28/15   [name], MD  metoprolol (LOPRESSOR) 100 MG tablet TAKE 1 TABLET BY MOUTH TWICE DAILY...OFFICE VISIT NEEDED FOR REFILLS 05/05/15   [name], MD  miconazole (MICATIN) 2 % cream Apply 1 application topically 2 (two) times daily. 11/09/14   Shawnee Knapp, MD  mupirocin ointment (BACTROBAN) 2 % APPLY TO THE AREAS AROUND THE BASE OF THE GREAT TOENAIL TWICE DAILY 03/02/15   [name], MD  omeprazole (PRILOSEC) 40 MG capsule Take 40 mg by mouth daily.    Historical Provider, MD  rosuvastatin (CRESTOR) 5 MG tablet Take 1 tablet (5 mg total) by mouth daily. 04/28/15   [name], MD  traMADol (ULTRAM) 50 MG tablet Take 1 tablet (50 mg total) by mouth every 6 (six) hours as needed. 03/14/14   [name], MD  triamcinolone cream (KENALOG) 0.1 % Apply 3 times a day as instructed to 05/05/14   [name], MD  vitamin E 100 UNIT capsule Take 100 Units by mouth daily.    Historical Provider, MD     ROS: The patient denies fevers, chills, night sweats, unintentional weight loss, chest pain, palpitations, wheezing, dyspnea on exertion, nausea, vomiting, abdominal pain, dysuria, hematuria, melena, numbness, weakness, or tingling.   All other systems have been reviewed and were otherwise negative with the exception of those mentioned in the HPI and as above.    PHYSICAL EXAM: Filed Vitals:   07/28/15 1000  BP: 179/91  Pulse: 67  Temp: 98.4 F (36.9 C)  Resp: 18   Body mass index is 29.62 kg/(m^2).   General: Tearful female in no distress. HEENT:  Normocephalic, atraumatic, oropharynx patent. Eye:  [facility] Cardiovascular:  Regular rate and rhythm, no rubs murmurs or gallops.  No Carotid bruits, radial pulse intact. No pedal edema. Repeat blood pressure: 170/100. Respiratory: Clear to auscultation bilaterally.  No wheezes, rales, or rhonchi.  No cyanosis, no use of accessory musculature Abdominal: No organomegaly, abdomen is  soft and non-tender, positive bowel sounds.  No masses. Musculoskeletal: Gait intact. No edema, tenderness Skin: No rashes. Neurologic: Facial musculature symmetric. Psychiatric: Patient acts appropriately throughout our interaction. Lymphatic: No cervical or submandibular lymphadenopathy Genitourinary/Anorectal: No acute findings    LABS: Results for orders placed or performed in visit on 07/28/15  POCT glycosylated hemoglobin (Hb A1C)  Result Value Ref Range   Hemoglobin A1C 7.9   POCT glucose (manual entry)  Result Value Ref Range   POC Glucose 168 (A) 70 - 99 mg/dl     EKG/XRAY:   Primary read interpreted by Dr. Everlene Farrier at [facility].   ASSESSMENT/PLAN: I advised patient to join a support group for depression. It is important that she works hard to recover from the anger she has for her husband. Blood pressure is not under control. I did not put her on metformin because her creatinine is elevated. I suspect her renal disease is secondary to a combination of hypertension poorly controlled and her diabetes. I have referred her to endocrinology to consider insulin. Her blood pressure is not at goal. I added losartan 25 mg 1 a day. Renal function will also need to be monitored closely. Patient is very angry over what happened with her husband's death. She is angry that he left everything to his family and nothing for her. She is financially strapped and is angry about this. Gross sideeffects, risk and benefits, and alternatives of medications d/w patient. Patient is aware that all medications have potential sideeffects and we are unable to predict every sideeffect  or drug-drug interaction that may occur.  Arlyss Queen MD 07/28/2015 10:50 AM

## 2015-07-29 LAB — MICROALBUMIN, URINE: Microalb, Ur: 2.5 mg/dL — ABNORMAL HIGH (ref <2.0)

## 2015-07-30 DIAGNOSIS — E119 Type 2 diabetes mellitus without complications: Secondary | ICD-10-CM | POA: Diagnosis not present

## 2015-08-06 ENCOUNTER — Other Ambulatory Visit: Payer: Self-pay | Admitting: Emergency Medicine

## 2015-08-09 NOTE — Telephone Encounter (Signed)
Called in.

## 2015-08-22 DIAGNOSIS — R102 Pelvic and perineal pain: Secondary | ICD-10-CM | POA: Diagnosis not present

## 2015-08-26 ENCOUNTER — Encounter: Payer: Self-pay | Admitting: Endocrinology

## 2015-08-30 ENCOUNTER — Encounter: Payer: Self-pay | Admitting: Emergency Medicine

## 2015-09-01 ENCOUNTER — Ambulatory Visit: Payer: Self-pay | Admitting: Emergency Medicine

## 2015-09-06 ENCOUNTER — Other Ambulatory Visit: Payer: Self-pay | Admitting: Emergency Medicine

## 2015-09-07 NOTE — Telephone Encounter (Signed)
Faxed

## 2015-09-26 ENCOUNTER — Other Ambulatory Visit: Payer: Self-pay | Admitting: Emergency Medicine

## 2015-10-04 ENCOUNTER — Ambulatory Visit (INDEPENDENT_AMBULATORY_CARE_PROVIDER_SITE_OTHER): Payer: Medicare Other | Admitting: Emergency Medicine

## 2015-10-04 VITALS — BP 125/71 | HR 55 | Temp 97.9°F | Resp 16 | Ht 65.0 in | Wt 176.0 lb

## 2015-10-04 DIAGNOSIS — I10 Essential (primary) hypertension: Secondary | ICD-10-CM

## 2015-10-04 DIAGNOSIS — E039 Hypothyroidism, unspecified: Secondary | ICD-10-CM | POA: Diagnosis not present

## 2015-10-04 DIAGNOSIS — E119 Type 2 diabetes mellitus without complications: Secondary | ICD-10-CM

## 2015-10-04 LAB — GLUCOSE, POCT (MANUAL RESULT ENTRY): POC GLUCOSE: 147 mg/dL — AB (ref 70–99)

## 2015-10-04 NOTE — Progress Notes (Signed)
Patient ID: Sherri Burns, female   DOB: 08-20-1936, 79 y.o.   MRN: [id number]     This chart was scribed for Arlyss Queen, MD by Zola Button, Medical Scribe. This patient was seen in room 22 and the patient's care was started at 11:48 AM.   Chief Complaint:  Chief Complaint  Patient presents with  . Medication Refill  . Follow-up  . Hypertension  . Thyroid Problem    HPI: Sherri Burns is a 79 y.o. female who reports to [facility] today for a follow-up. Patient states she is compliant with all of her medications, although she was out of her medications for 4 days last week. She was unable to fill all of her medications due to cost. She states she has been doing better with her blood pressure and has been watching her diet.  Patient states she is doing better with her depression. See last note. She has been busy doing things around the house (raking leaves, cleaning windows, canning greens). Two of her cousins passed away this past month.   Past Medical History  Diagnosis Date  . Hypertension   . GERD (gastroesophageal reflux disease)     + hpylori  . Hyperlipidemia   . Anxiety   . Cancer 15 yrs ago    ho renal s/p nephrectomy  . Diabetes mellitus   . Elevated TSH   . Elevated serum creatinine   . Ischemic colitis   . Renal insufficiency   . Colon polyps 11/2008    tubular adenoma   Past Surgical History  Procedure Laterality Date  . Nephrectomy      R  . Partial hysterectomy  1978   Social History   Social History  . Marital Status: Married    Spouse Name: N/A  . Number of Children: N/A  . Years of Education: N/A   Social History Main Topics  . Smoking status: Current Every Day Smoker -- 0.50 packs/day for 55 years    Types: Cigarettes  . Smokeless tobacco: Never Used     Comment: cut back amount still  trying to quit  . Alcohol Use: No  . Drug Use: No  . Sexual Activity: Yes   Other Topics Concern  . Not on file   Social History Narrative   Loss of  son.   Family History  Problem Relation Age of Onset  . Cancer Mother     colon, kidney cancer  . Cancer Father     throat cancer   Allergies  Allergen Reactions  . Ciprocin-Fluocin-Procin [Fluocinolone Acetonide]   . Clonidine Derivatives     Dry mouth  . Codeine     hallucinations  . Crestor [Rosuvastatin Calcium]     cramps  . Penicillins   . Simvastatin     Upset stomach   . Tessalon Perles     Gi upset    Prior to Admission medications   Medication Sig Start Date End Date Taking? Authorizing Provider  aspirin 81 MG tablet Take 81 mg by mouth daily.   Yes Historical Provider, MD  CARTIA XT 240 MG 24 hr capsule TAKE ONE CAPSULE BY MOUTH EVERY DAY 09/27/15  Yes [name], MD  Cholecalciferol (VITAMIN D-3 PO) Take 1 tablet by mouth daily.   Yes Historical Provider, MD  clonazePAM (KLONOPIN) 1 MG tablet TAKE 1 TABLET BY MOUTH TWICE DAILY AS NEEDED 09/07/15  Yes [name], MD  fluticasone (FLONASE) 50 MCG/ACT nasal spray USE 2 SPRAYS IN  EACH NOSTRIL EVERY DAY 10/11/14  Yes [name], MD  furosemide (LASIX) 40 MG tablet TAKE 1 TABLET BY MOUTH EVERY DAY 05/02/15  Yes [name], MD  glipiZIDE (GLUCOTROL XL) 10 MG 24 hr tablet TAKE 1 TABLET BY MOUTH TWICE DAILY 09/06/15  Yes Mancel Bale, PA-C  levothyroxine (SYNTHROID, LEVOTHROID) 75 MCG tablet TAKE 1 TABLET BY MOUTH EVERY DAY ON AN EMPTY STOMACH...OFFICE VISIT NEEDED FOR REFILLS 07/10/15  Yes [name], MD  metoprolol (LOPRESSOR) 100 MG tablet TAKE 1 TABLET BY MOUTH TWICE DAILY 08/08/15  Yes Mancel Bale, PA-C  miconazole (MICATIN) 2 % cream Apply 1 application topically 2 (two) times daily. 11/09/14  Yes Shawnee Knapp, MD  mupirocin ointment (BACTROBAN) 2 % APPLY TO THE AREAS AROUND THE BASE OF THE GREAT TOENAIL TWICE DAILY 03/02/15  Yes [name], MD  omeprazole (PRILOSEC) 40 MG capsule Take 40 mg by mouth daily.   Yes Historical Provider, MD  rosuvastatin (CRESTOR) 5 MG tablet Take 1 tablet (5 mg total) by  mouth daily. 04/28/15  Yes [name], MD  triamcinolone cream (KENALOG) 0.1 % Apply 3 times a day as instructed to 05/05/14  Yes [name], MD  vitamin E 100 UNIT capsule Take 100 Units by mouth daily.   Yes Historical Provider, MD  losartan (COZAAR) 25 MG tablet Take 1 tablet daily Patient not taking: Reported on 10/04/2015 07/28/15   [name], MD  metFORMIN (GLUCOPHAGE-XR) 500 MG 24 hr tablet TAKE 1 TABLET BY MOUTH A DAY FOR THE FIRST 2 WEEKS AND IF TOLERATED FROM A GI STANDPOINT INCREASE TO 2 TABLETS DAILY Patient not taking: Reported on 07/28/2015 04/28/15   [name], MD     ROS: The patient denies fevers, chills, night sweats, unintentional weight loss, chest pain, palpitations, wheezing, dyspnea on exertion, nausea, vomiting, abdominal pain, dysuria, hematuria, melena, numbness, weakness, or tingling.   All other systems have been reviewed and were otherwise negative with the exception of those mentioned in the HPI and as above.    PHYSICAL EXAM: Filed Vitals:   10/04/15 1118  BP: 125/71  Pulse: 55  Temp: 97.9 F (36.6 C)  Resp: 16   Body mass index is 29.29 kg/(m^2).   General: Alert, no acute distress HEENT:  Normocephalic, atraumatic, oropharynx patent. Eye: [facility] Cardiovascular:  Regular rate and rhythm. Grade 1 systolic murmur, left sternal border.  No Carotid bruits, radial pulse intact. No pedal edema. Repeat blood pressure: 180/100. Respiratory: Clear to auscultation bilaterally.  No wheezes, rales, or rhonchi.  No cyanosis, no use of accessory musculature Abdominal: No organomegaly, abdomen is soft and non-tender, positive bowel sounds.  No masses. Musculoskeletal: Gait intact. No edema, tenderness Skin: No rashes. Neurologic: Facial musculature symmetric. Psychiatric: Patient acts appropriately throughout our interaction. Lymphatic: No cervical or submandibular lymphadenopathy    LABS: Results for orders placed or performed in visit on 10/04/15   POCT glucose (manual entry)  Result Value Ref Range   POC Glucose 147 (A) 70 - 99 mg/dl     EKG/XRAY:   Primary read interpreted by Dr. Everlene Farrier at [facility]   ASSESSMENT/PLAN: Patient's depression is markedly better. She seems happy today. We'll recheck in 6-8 weeks for repeat hemoglobin A1c. Blood pressure is at goal today.  By signing my name below, I, Zola Button, attest that this documentation has been prepared under the direction and in the presence of Arlyss Queen, MD.  Electronically Signed: Zola Button,  Medical Scribe. 10/04/2015. 11:48 AM.   Johney Maine sideeffects, risk and benefits, and alternatives of medications d/w patient. Patient is aware that all medications have potential sideeffects and we are unable to predict every sideeffect or drug-drug interaction that may occur.  Arlyss Queen MD 10/04/2015 11:48 AM

## 2015-10-05 ENCOUNTER — Encounter: Payer: Self-pay | Admitting: Family Medicine

## 2015-10-10 ENCOUNTER — Other Ambulatory Visit: Payer: Self-pay | Admitting: Emergency Medicine

## 2015-10-10 ENCOUNTER — Other Ambulatory Visit: Payer: Self-pay | Admitting: Physician Assistant

## 2015-10-21 ENCOUNTER — Ambulatory Visit (INDEPENDENT_AMBULATORY_CARE_PROVIDER_SITE_OTHER): Payer: Medicare Other | Admitting: Family Medicine

## 2015-10-21 VITALS — BP 190/92 | HR 77 | Temp 98.6°F | Resp 16 | Ht 65.0 in | Wt 172.0 lb

## 2015-10-21 DIAGNOSIS — K625 Hemorrhage of anus and rectum: Secondary | ICD-10-CM | POA: Diagnosis not present

## 2015-10-21 DIAGNOSIS — Z87898 Personal history of other specified conditions: Secondary | ICD-10-CM

## 2015-10-21 DIAGNOSIS — E119 Type 2 diabetes mellitus without complications: Secondary | ICD-10-CM

## 2015-10-21 DIAGNOSIS — M545 Low back pain, unspecified: Secondary | ICD-10-CM

## 2015-10-21 DIAGNOSIS — R1032 Left lower quadrant pain: Secondary | ICD-10-CM | POA: Diagnosis not present

## 2015-10-21 DIAGNOSIS — Z8719 Personal history of other diseases of the digestive system: Secondary | ICD-10-CM

## 2015-10-21 DIAGNOSIS — I10 Essential (primary) hypertension: Secondary | ICD-10-CM

## 2015-10-21 LAB — POCT URINALYSIS DIP (MANUAL ENTRY)
BILIRUBIN UA: NEGATIVE
GLUCOSE UA: NEGATIVE
Ketones, POC UA: NEGATIVE
LEUKOCYTES UA: NEGATIVE
NITRITE UA: NEGATIVE
Protein Ur, POC: 30 — AB
Spec Grav, UA: 1.02
Urobilinogen, UA: 0.2
pH, UA: 6

## 2015-10-21 LAB — POC MICROSCOPIC URINALYSIS (UMFC): MUCUS RE: ABSENT

## 2015-10-21 LAB — POCT CBC
GRANULOCYTE PERCENT: 57.1 % (ref 37–80)
HCT, POC: 40.6 % (ref 37.7–47.9)
Hemoglobin: 13.3 g/dL (ref 12.2–16.2)
Lymph, poc: 2.3 (ref 0.6–3.4)
MCH: 31.5 pg — AB (ref 27–31.2)
MCHC: 32.9 g/dL (ref 31.8–35.4)
MCV: 95.9 fL (ref 80–97)
MID (CBC): 0.3 (ref 0–0.9)
MPV: 7.8 fL (ref 0–99.8)
PLATELET COUNT, POC: 206 10*3/uL (ref 142–424)
POC Granulocyte: 3.5 (ref 2–6.9)
POC LYMPH PERCENT: 38 %L (ref 10–50)
POC MID %: 4.9 %M (ref 0–12)
RBC: 4.23 M/uL (ref 4.04–5.48)
RDW, POC: 15.5 %
WBC: 6.1 10*3/uL (ref 4.6–10.2)

## 2015-10-21 LAB — IFOBT (OCCULT BLOOD): IMMUNOLOGICAL FECAL OCCULT BLOOD TEST: POSITIVE

## 2015-10-21 LAB — GLUCOSE, POCT (MANUAL RESULT ENTRY): POC Glucose: 113 mg/dl — AB (ref 70–99)

## 2015-10-21 MED ORDER — HYDROCORTISONE ACE-PRAMOXINE 1-1 % RE CREA: 1.0000 "application " | TOPICAL_CREAM | Freq: Two times a day (BID) | RECTAL | Status: DC

## 2015-10-21 NOTE — Progress Notes (Signed)
Patient ID: Sherri Burns, female    DOB: 07-02-1936  Age: 79 y.o. MRN: [id number]  Chief Complaint  Patient presents with  . Rectal Bleeding    Bright red bleeding began this AM after her Bowel Movement  . Back Pain    began this am     Subjective:   79 year old lady who is here with history of having drunk some coffee this morning and felt large go to the bathroom, went to the closest bathroom. She passed a bowl full of blood which was on the tissue and when she stood up there is more dripped out onto the floor. She had a friend bring her on over here. She has been having a little pain in the left lower quadrant and left low back this morning. She has a long history of low back pain which is been bothering her a lot. She called Dr. Everlene Farrier. She had a colonoscopy this summer which was fairly normal. One polyp and some little diverticula. She has had hemorrhoidal banding in the past.  Current allergies, medications, problem list, past/family and social histories reviewed.  Objective:  BP 190/92 mmHg  Pulse 77  Temp(Src) 98.6 F (37 C) (Oral)  Resp 16  Ht 5\' 5"  (1.651 m)  Wt 172 lb (78.019 kg)  BMI 28.62 kg/m2  SpO2 99%  Anxious. Abdomen soft without masses. Mild left lower quadrant and lower abdominal tenderness. Low back seems diffusely tender in the low back areas with no CVA tenderness. She denies any urinary or vaginal bleeding. She says there is a lot of blood in the first pain which put on. She has a pain at all which does not have any blood at this time. The anus appears normal except for some hemorrhoidal tags. No active bleeding or even old blood could be noted. No fissures seen.  Procedure note: Anoscopy was performed. The scope was inserted without difficulty. There is brown stool and no blood seen. The scope was plugged so I repeated it with a clean scope and there was still a moderate amount of stool both the mucosa that was seen did not look bloody at all. No inflammation  noted.  Assessment & Plan:   Assessment: 1. BRBPR (bright red blood per rectum)   2. History of hemorrhoids   3. Left-sided low back pain without sciatica   4. LLQ abdominal pain       Plan: Will treat with some hemorrhoidal cream. She can follow-up with Dr. Everlene Farrier whom I spoke to. Check labs.  Results for orders placed or performed in visit on 10/21/15  POCT CBC  Result Value Ref Range   WBC 6.1 4.6 - 10.2 K/uL   Lymph, poc 2.3 0.6 - 3.4   POC LYMPH PERCENT 38.0 10 - 50 %L   MID (cbc) 0.3 0 - 0.9   POC MID % 4.9 0 - 12 %M   POC Granulocyte 3.5 2 - 6.9   Granulocyte percent 57.1 37 - 80 %G   RBC 4.23 4.04 - 5.48 M/uL   Hemoglobin 13.3 12.2 - 16.2 g/dL   HCT, POC 40.6 37.7 - 47.9 %   MCV 95.9 80 - 97 fL   MCH, POC 31.5 (A) 27 - 31.2 pg   MCHC 32.9 31.8 - 35.4 g/dL   RDW, POC 15.5 %   Platelet Count, POC 206 142 - 424 K/uL   MPV 7.8 0 - 99.8 fL  IFOBT POC (occult bld, rslt in office)  Result Value Ref  Range   IFOBT Positive   POCT Microscopic Urinalysis ([facility])  Result Value Ref Range   WBC,UR,HPF,POC Few (A) None WBC/hpf   RBC,UR,HPF,POC Few (A) None RBC/hpf   Bacteria None None, Too numerous to count   Mucus Absent Absent   Epithelial Cells, UR Per Microscopy Few (A) None, Too numerous to count cells/hpf  POCT urinalysis dipstick  Result Value Ref Range   Color, UA yellow yellow   Clarity, UA clear clear   Glucose, UA negative negative   Bilirubin, UA negative negative   Ketones, POC UA negative negative   Spec Grav, UA 1.020    Blood, UA trace-lysed (A) negative   pH, UA 6.0    Protein Ur, POC =30 (A) negative   Urobilinogen, UA 0.2    Nitrite, UA Negative Negative   Leukocytes, UA Negative Negative   Hemoccult was positive. CBC does not show any major blood losing.  Blood pressure elevation I'm sure is from the anxiety. 156/86 on repeat.  Patient requests recheck of blood sugar.   Patient Instructions  Use the hemorrhoidal cream 2 or 3 times daily if  needed  Return if further bleeding  Follow-up with Dr. Everlene Farrier in the next week or 2.  Go to the emergency room in the event of heavy bleeding if a closed.     Return if symptoms worsen or fail to improve.   Kele Barthelemy, MD 10/21/2015

## 2015-10-21 NOTE — Patient Instructions (Signed)
Use the hemorrhoidal cream 2 or 3 times daily if needed  Return if further bleeding  Follow-up with Dr. Everlene Farrier in the next week or 2.  Go to the emergency room in the event of heavy bleeding if a closed.

## 2015-10-29 ENCOUNTER — Other Ambulatory Visit: Payer: Self-pay | Admitting: Emergency Medicine

## 2015-10-29 DIAGNOSIS — E119 Type 2 diabetes mellitus without complications: Secondary | ICD-10-CM | POA: Diagnosis not present

## 2015-11-07 ENCOUNTER — Other Ambulatory Visit: Payer: Self-pay | Admitting: Emergency Medicine

## 2015-11-09 NOTE — Telephone Encounter (Signed)
Faxed

## 2015-11-29 ENCOUNTER — Ambulatory Visit (INDEPENDENT_AMBULATORY_CARE_PROVIDER_SITE_OTHER): Payer: Medicare Other | Admitting: Emergency Medicine

## 2015-11-29 ENCOUNTER — Encounter: Payer: Self-pay | Admitting: Emergency Medicine

## 2015-11-29 VITALS — BP 146/84 | HR 66 | Temp 97.9°F | Resp 16 | Ht 65.0 in | Wt 176.0 lb

## 2015-11-29 DIAGNOSIS — I10 Essential (primary) hypertension: Secondary | ICD-10-CM

## 2015-11-29 DIAGNOSIS — Z23 Encounter for immunization: Secondary | ICD-10-CM

## 2015-11-29 DIAGNOSIS — R739 Hyperglycemia, unspecified: Secondary | ICD-10-CM | POA: Diagnosis not present

## 2015-11-29 DIAGNOSIS — Z658 Other specified problems related to psychosocial circumstances: Secondary | ICD-10-CM | POA: Diagnosis not present

## 2015-11-29 DIAGNOSIS — F439 Reaction to severe stress, unspecified: Secondary | ICD-10-CM

## 2015-11-29 LAB — GLUCOSE, POCT (MANUAL RESULT ENTRY): POC Glucose: 105 mg/dl — AB (ref 70–99)

## 2015-11-29 LAB — POCT GLYCOSYLATED HEMOGLOBIN (HGB A1C): Hemoglobin A1C: 6.9

## 2015-11-29 MED ORDER — CLONAZEPAM 1 MG PO TABS: ORAL_TABLET | ORAL | Status: DC

## 2015-11-29 MED ORDER — METOPROLOL TARTRATE 100 MG PO TABS: ORAL_TABLET | ORAL | Status: DC

## 2015-11-29 MED ORDER — DILTIAZEM HCL ER COATED BEADS 240 MG PO CP24: 240.0000 mg | ORAL_CAPSULE | Freq: Every day | ORAL | Status: DC

## 2015-11-29 MED ORDER — GLIPIZIDE ER 10 MG PO TB24: ORAL_TABLET | ORAL | Status: DC

## 2015-11-29 NOTE — Progress Notes (Addendum)
Subjective:  This chart was scribed for Sherri Russian, MD by Tamsen Roers, at [facility].  This patient was seen in room 21 and the patient's care was started at 11:43 AM.    Patient ID: Sherri Burns, female    DOB: September 11, 1936, 80 y.o.   MRN: [id number] Chief Complaint  Patient presents with  . Follow-up  . Diabetes  . Hypertension    HPI  HPI Comments: Sherri Burns is a 80 y.o. female with a history of diabetes and hypertension who presents to the [facility] for a follow up.   Medication: She takes Clonazepam (1 in the morning and 1.5 at night) for her nerves and states that it helps her significantly. Patient does not drink alcohol. She takes her Crestor (a couple a week) but states that she cant take it constantly as it makes her "hurt all over".   Records show that pharmacy reports that she has not been picking up her medication.  Patient states that she has not been able to get her medication due to her financial situation and is willing to ask the pharmacy if there is a way that she can purchase cheaper versions of the medications.   Depression: Patient notes that her depression is doing better.  Her roof is currently leaking and had a friend willing to fix it at a cheaper price. She does not have a boyfriend but states that she is going out to dinner with the man who is fixing her roof today.    Exercise: She states that she is not able to exercise often (due to her aches and pains) but has been exercising at home as much as she can.   Eczema: Patient states that her eczema acts up at times.   Vaccinations: Patient has not had the new Prevnar vaccination and is willing to get it today.   Hemorrhoids: She was seen one month ago with rectal bleeding which was thought to be secondary to rectal hemorrhoids. Colonoscopy in may 2016 showed hemorrhoids.     Patient Active Problem List   Diagnosis Date Noted  . Diabetes  mellitus ([place]) 09/23/2012  . Hypertension   . GERD (gastroesophageal reflux disease)   . Hyperlipidemia   . Anxiety   . Cancer ([place])   . Elevated TSH   . Renal insufficiency    Past Medical History  Diagnosis Date  . Hypertension   . GERD (gastroesophageal reflux disease)     + hpylori  . Hyperlipidemia   . Anxiety   . Cancer ([place]) 15 yrs ago    ho renal s/p nephrectomy  . Diabetes mellitus   . Elevated TSH   . Elevated serum creatinine   . Ischemic colitis ([place])   . Renal insufficiency   . Colon polyps 11/2008    tubular adenoma   Past Surgical History  Procedure Laterality Date  . Nephrectomy      R  . Partial hysterectomy  1978   Allergies  Allergen Reactions  . Ciprocin-Fluocin-Procin [Fluocinolone Acetonide]   . Clonidine Derivatives     Dry mouth  . Codeine     hallucinations  . Crestor [Rosuvastatin Calcium]     cramps  . Penicillins   . Simvastatin     Upset stomach   . Tessalon Perles     Gi upset    Prior to Admission medications   Medication Sig Start Date End Date Taking? Authorizing Provider  aspirin 81 MG tablet Take 81 mg by mouth daily.    Historical Provider, MD  CARTIA XT 240 MG 24 hr capsule TAKE ONE CAPSULE BY MOUTH EVERY DAY 11/01/15   Sherri Russian, MD  Cholecalciferol (VITAMIN D-3 PO) Take 1 tablet by mouth daily.    Historical Provider, MD  clonazePAM (KLONOPIN) 1 MG tablet TAKE 1 TABLET TWICE DAILY AS NEEDED 11/07/15   Sherri Russian, MD  fluticasone [facility]) 50 MCG/ACT nasal spray USE 2 SPRAYS IN EACH NOSTRIL EVERY DAY 10/11/14   Sherri Russian, MD  furosemide (LASIX) 40 MG tablet TAKE 1 TABLET BY MOUTH EVERY DAY 11/07/15   Sherri Russian, MD  glipiZIDE (GLUCOTROL XL) 10 MG 24 hr tablet TAKE 1 TABLET BY MOUTH TWICE DAILY 09/06/15   Mancel Bale, PA-C  levothyroxine (SYNTHROID, LEVOTHROID) 75 MCG tablet Take 1 tablet (75 mcg total) by mouth daily before breakfast. 11/07/15   Sherri Russian, MD  losartan (COZAAR) 25 MG tablet Take 1  tablet daily 07/28/15   Sherri Russian, MD  metFORMIN (GLUCOPHAGE-XR) 500 MG 24 hr tablet TAKE 1 TABLET BY MOUTH A DAY FOR THE FIRST 2 WEEKS AND IF TOLERATED FROM A GI STANDPOINT INCREASE TO 2 TABLETS DAILY Patient not taking: Reported on 07/28/2015 04/28/15   Sherri Russian, MD  metoprolol (LOPRESSOR) 100 MG tablet TAKE 1 TABLET BY MOUTH TWICE DAILY 08/08/15   Mancel Bale, PA-C  miconazole (MICATIN) 2 % cream Apply 1 application topically 2 (two) times daily. Patient not taking: Reported on 10/21/2015 11/09/14   Shawnee Knapp, MD  mupirocin ointment (BACTROBAN) 2 % APPLY TO THE AREAS AROUND THE BASE OF THE GREAT TOENAIL TWICE DAILY Patient not taking: Reported on 10/21/2015 03/02/15   Sherri Russian, MD  omeprazole (PRILOSEC) 40 MG capsule Take 40 mg by mouth daily.    Historical Provider, MD  pramoxine-hydrocortisone [facility]) 1-1 % rectal cream Place 1 application rectally 2 (two) times daily. 10/21/15   Posey Boyer, MD  rosuvastatin (CRESTOR) 5 MG tablet Take 1 tablet (5 mg total) by mouth daily. 04/28/15   Sherri Russian, MD  triamcinolone cream (KENALOG) 0.1 % Apply 3 times a day as instructed to Patient not taking: Reported on 10/21/2015 05/05/14   Sherri Russian, MD  vitamin B-12 (CYANOCOBALAMIN) 100 MCG tablet Take 100 mcg by mouth daily.    Historical Provider, MD  vitamin E 100 UNIT capsule Take 100 Units by mouth daily.    Historical Provider, MD   Social History   Social History  . Marital Status: Married    Spouse Name: N/A  . Number of Children: N/A  . Years of Education: N/A   Occupational History  . Not on file.   Social History Main Topics  . Smoking status: Current Some Day Smoker -- 0.50 packs/day for 55 years    Types: Cigarettes  . Smokeless tobacco: Never Used     Comment: cut back amount still  trying to quit  . Alcohol Use: No  . Drug Use: No  . Sexual Activity: Yes   Other Topics Concern  . Not on file   Social History Narrative   Loss of son.    Review of  Systems  Constitutional: Negative for fever and chills.  Eyes: Negative for pain, redness and itching.  Respiratory: Negative for cough and shortness of breath.   Gastrointestinal: Negative for nausea and vomiting.  Musculoskeletal: Positive for myalgias. Negative for neck pain  and neck stiffness.  Skin: Negative for color change.       Objective:   Physical Exam  Filed Vitals:   11/29/15 1129 11/29/15 1130  BP: 152/82 143/85  Pulse: 63 66  Temp: 97.9 F (36.6 C)   Resp: 16   Height: 5\' 5"  (1.651 m)   Weight: 176 lb (79.833 kg)      CONSTITUTIONAL: Well developed/well nourished HEAD: Normocephalic/atraumatic EYES: EOMI/PERRL SPINE/BACK:entire spine nontender CV: S1/S2 noted, no murmurs/rubs/gallops noted LUNGS: Lungs are clear to auscultation bilaterally, no apparent distress NEURO: Pt is awake/alert/appropriate, moves all extremitiesx4.  No facial droop.   EXTREMITIES: pulses normal/equal, full ROM SKIN: warm, color normal PSYCH: no abnormalities of mood noted, alert and oriented to situation  Results for orders placed or performed in visit on 11/29/15  POCT glycosylated hemoglobin (Hb A1C)  Result Value Ref Range   Hemoglobin A1C 6.9   POCT glucose (manual entry)  Result Value Ref Range   POC Glucose 105 (A) 70 - 99 mg/dl      Assessment & Plan:   Her blood pressure is better but still not at goal. Her hemoglobin A1c is down to 6.9 which is markedly better. She was encouraged to continue to socialize. She was encouraged to work on weight loss diet and start an exercise program.I personally performed the services described in this documentation, which was scribed in my presence. The recorded information has been reviewed and is accurate.Nena Jordan MD

## 2015-12-11 ENCOUNTER — Telehealth: Payer: Self-pay | Admitting: Family Medicine

## 2015-12-11 ENCOUNTER — Telehealth: Payer: Self-pay

## 2015-12-11 NOTE — Telephone Encounter (Signed)
Mail box full please scheduled pt appt that she had with daub in May

## 2015-12-11 NOTE — Telephone Encounter (Signed)
RELAYED [place]  12/11/2015  Telephone  MRN:  [id number]    Description: 80 year old female  Provider: Yvette Rack  Department: [facility]          Call [place] at 12/11/2015 9:56 AM     Status: Signed       Expand All Collapse All   Mail box full please scheduled pt appt that she had with daub in May             Encounter MyChart Messages     No messages in this encounter     Created by     Yvette Rack on 12/11/2015 09:56 AM

## 2015-12-29 ENCOUNTER — Encounter (HOSPITAL_COMMUNITY): Payer: Self-pay | Admitting: Emergency Medicine

## 2015-12-29 ENCOUNTER — Inpatient Hospital Stay (HOSPITAL_COMMUNITY)
Admission: EM | Admit: 2015-12-29 | Discharge: 2016-01-03 | DRG: 446 | Disposition: A | Discharge status: Home Health | Payer: Medicare Other | Attending: Internal Medicine | Admitting: Internal Medicine

## 2015-12-29 ENCOUNTER — Emergency Department (HOSPITAL_COMMUNITY): Payer: Medicare Other

## 2015-12-29 DIAGNOSIS — K5900 Constipation, unspecified: Secondary | ICD-10-CM | POA: Diagnosis not present

## 2015-12-29 DIAGNOSIS — K810 Acute cholecystitis: Secondary | ICD-10-CM | POA: Insufficient documentation

## 2015-12-29 DIAGNOSIS — R1011 Right upper quadrant pain: Secondary | ICD-10-CM

## 2015-12-29 DIAGNOSIS — Z881 Allergy status to other antibiotic agents status: Secondary | ICD-10-CM

## 2015-12-29 DIAGNOSIS — Z885 Allergy status to narcotic agent status: Secondary | ICD-10-CM

## 2015-12-29 DIAGNOSIS — K8000 Calculus of gallbladder with acute cholecystitis without obstruction: Secondary | ICD-10-CM | POA: Diagnosis not present

## 2015-12-29 DIAGNOSIS — K8012 Calculus of gallbladder with acute and chronic cholecystitis without obstruction: Principal | ICD-10-CM | POA: Diagnosis present

## 2015-12-29 DIAGNOSIS — R1110 Vomiting, unspecified: Secondary | ICD-10-CM | POA: Diagnosis not present

## 2015-12-29 DIAGNOSIS — F17210 Nicotine dependence, cigarettes, uncomplicated: Secondary | ICD-10-CM | POA: Diagnosis present

## 2015-12-29 DIAGNOSIS — E1122 Type 2 diabetes mellitus with diabetic chronic kidney disease: Secondary | ICD-10-CM | POA: Diagnosis not present

## 2015-12-29 DIAGNOSIS — Z808 Family history of malignant neoplasm of other organs or systems: Secondary | ICD-10-CM

## 2015-12-29 DIAGNOSIS — E1142 Type 2 diabetes mellitus with diabetic polyneuropathy: Secondary | ICD-10-CM | POA: Diagnosis present

## 2015-12-29 DIAGNOSIS — K8020 Calculus of gallbladder without cholecystitis without obstruction: Secondary | ICD-10-CM | POA: Diagnosis not present

## 2015-12-29 DIAGNOSIS — F419 Anxiety disorder, unspecified: Secondary | ICD-10-CM | POA: Diagnosis present

## 2015-12-29 DIAGNOSIS — E785 Hyperlipidemia, unspecified: Secondary | ICD-10-CM | POA: Diagnosis present

## 2015-12-29 DIAGNOSIS — R112 Nausea with vomiting, unspecified: Secondary | ICD-10-CM | POA: Diagnosis not present

## 2015-12-29 DIAGNOSIS — Z8051 Family history of malignant neoplasm of kidney: Secondary | ICD-10-CM

## 2015-12-29 DIAGNOSIS — Z90710 Acquired absence of both cervix and uterus: Secondary | ICD-10-CM

## 2015-12-29 DIAGNOSIS — Z79899 Other long term (current) drug therapy: Secondary | ICD-10-CM | POA: Diagnosis not present

## 2015-12-29 DIAGNOSIS — R109 Unspecified abdominal pain: Secondary | ICD-10-CM | POA: Diagnosis not present

## 2015-12-29 DIAGNOSIS — I129 Hypertensive chronic kidney disease with stage 1 through stage 4 chronic kidney disease, or unspecified chronic kidney disease: Secondary | ICD-10-CM | POA: Diagnosis not present

## 2015-12-29 DIAGNOSIS — J9811 Atelectasis: Secondary | ICD-10-CM | POA: Diagnosis not present

## 2015-12-29 DIAGNOSIS — E0590 Thyrotoxicosis, unspecified without thyrotoxic crisis or storm: Secondary | ICD-10-CM | POA: Diagnosis present

## 2015-12-29 DIAGNOSIS — N183 Chronic kidney disease, stage 3 unspecified: Secondary | ICD-10-CM | POA: Diagnosis present

## 2015-12-29 DIAGNOSIS — Z7984 Long term (current) use of oral hypoglycemic drugs: Secondary | ICD-10-CM

## 2015-12-29 DIAGNOSIS — K219 Gastro-esophageal reflux disease without esophagitis: Secondary | ICD-10-CM | POA: Diagnosis present

## 2015-12-29 DIAGNOSIS — Z905 Acquired absence of kidney: Secondary | ICD-10-CM

## 2015-12-29 DIAGNOSIS — R1013 Epigastric pain: Secondary | ICD-10-CM | POA: Diagnosis not present

## 2015-12-29 DIAGNOSIS — K828 Other specified diseases of gallbladder: Secondary | ICD-10-CM | POA: Diagnosis not present

## 2015-12-29 DIAGNOSIS — Z7982 Long term (current) use of aspirin: Secondary | ICD-10-CM | POA: Diagnosis not present

## 2015-12-29 DIAGNOSIS — I10 Essential (primary) hypertension: Secondary | ICD-10-CM | POA: Diagnosis present

## 2015-12-29 DIAGNOSIS — Z86010 Personal history of colonic polyps: Secondary | ICD-10-CM

## 2015-12-29 DIAGNOSIS — Z880 Allergy status to penicillin: Secondary | ICD-10-CM | POA: Diagnosis not present

## 2015-12-29 DIAGNOSIS — K819 Cholecystitis, unspecified: Secondary | ICD-10-CM | POA: Diagnosis not present

## 2015-12-29 DIAGNOSIS — R110 Nausea: Secondary | ICD-10-CM

## 2015-12-29 DIAGNOSIS — Z888 Allergy status to other drugs, medicaments and biological substances status: Secondary | ICD-10-CM | POA: Diagnosis not present

## 2015-12-29 DIAGNOSIS — Z85528 Personal history of other malignant neoplasm of kidney: Secondary | ICD-10-CM | POA: Diagnosis not present

## 2015-12-29 DIAGNOSIS — E039 Hypothyroidism, unspecified: Secondary | ICD-10-CM | POA: Diagnosis not present

## 2015-12-29 HISTORY — DX: Disease of esophagus, unspecified: K22.9

## 2015-12-29 LAB — COMPREHENSIVE METABOLIC PANEL
ALBUMIN: 4.3 g/dL (ref 3.5–5.0)
ALT: 16 U/L (ref 14–54)
ANION GAP: 9 (ref 5–15)
AST: 18 U/L (ref 15–41)
Alkaline Phosphatase: 70 U/L (ref 38–126)
BILIRUBIN TOTAL: 0.7 mg/dL (ref 0.3–1.2)
BUN: 16 mg/dL (ref 6–20)
CHLORIDE: 103 mmol/L (ref 101–111)
CO2: 28 mmol/L (ref 22–32)
Calcium: 9.3 mg/dL (ref 8.9–10.3)
Creatinine, Ser: 1.39 mg/dL — ABNORMAL HIGH (ref 0.44–1.00)
GFR calc Af Amer: 41 mL/min — ABNORMAL LOW (ref >60)
GFR calc non Af Amer: 36 mL/min — ABNORMAL LOW (ref >60)
GLUCOSE: 209 mg/dL — AB (ref 65–99)
POTASSIUM: 4.4 mmol/L (ref 3.5–5.1)
SODIUM: 140 mmol/L (ref 135–145)
Total Protein: 7.9 g/dL (ref 6.5–8.1)

## 2015-12-29 LAB — URINALYSIS, ROUTINE W REFLEX MICROSCOPIC
BILIRUBIN URINE: NEGATIVE
GLUCOSE, UA: 250 mg/dL — AB
KETONES UR: NEGATIVE mg/dL
NITRITE: NEGATIVE
PH: 7.5 (ref 5.0–8.0)
Protein, ur: 30 mg/dL — AB
SPECIFIC GRAVITY, URINE: 1.013 (ref 1.005–1.030)

## 2015-12-29 LAB — APTT: aPTT: 25 s (ref 24–37)

## 2015-12-29 LAB — CBC WITH DIFFERENTIAL/PLATELET
Basophils Absolute: 0 10*3/uL (ref 0.0–0.1)
Basophils Relative: 0 %
Eosinophils Absolute: 0.1 10*3/uL (ref 0.0–0.7)
Eosinophils Relative: 1 %
HEMATOCRIT: 37.4 % (ref 36.0–46.0)
HEMOGLOBIN: 12.4 g/dL (ref 12.0–15.0)
LYMPHS ABS: 1.4 10*3/uL (ref 0.7–4.0)
LYMPHS PCT: 13 %
MCH: 31.7 pg (ref 26.0–34.0)
MCHC: 33.2 g/dL (ref 30.0–36.0)
MCV: 95.7 fL (ref 78.0–100.0)
MONO ABS: 0.4 10*3/uL (ref 0.1–1.0)
MONOS PCT: 3 %
NEUTROS ABS: 8.9 10*3/uL — AB (ref 1.7–7.7)
NEUTROS PCT: 83 %
Platelets: 199 10*3/uL (ref 150–400)
RBC: 3.91 MIL/uL (ref 3.87–5.11)
RDW: 13.9 % (ref 11.5–15.5)
WBC: 10.7 10*3/uL — ABNORMAL HIGH (ref 4.0–10.5)

## 2015-12-29 LAB — URINE MICROSCOPIC-ADD ON

## 2015-12-29 LAB — GRAM STAIN

## 2015-12-29 LAB — PROTIME-INR
INR: 0.98 (ref 0.00–1.49)
Prothrombin Time: 13.2 s (ref 11.6–15.2)

## 2015-12-29 LAB — POC OCCULT BLOOD, ED: Fecal Occult Bld: NEGATIVE

## 2015-12-29 LAB — GLUCOSE, CAPILLARY
GLUCOSE-CAPILLARY: 147 mg/dL — AB (ref 65–99)
Glucose-Capillary: 132 mg/dL — ABNORMAL HIGH (ref 65–99)

## 2015-12-29 LAB — LIPASE, BLOOD: Lipase: 37 U/L (ref 11–51)

## 2015-12-29 MED ORDER — ONDANSETRON HCL 4 MG/2ML IJ SOLN
4.0000 mg | Freq: Four times a day (QID) | INTRAMUSCULAR | Status: DC | PRN
  Administered 2015-12-31: 4 mg via INTRAVENOUS
  Filled 2015-12-29: qty 2

## 2015-12-29 MED ORDER — CIPROFLOXACIN IN D5W 400 MG/200ML IV SOLN
INTRAVENOUS | Status: AC
  Administered 2015-12-29: 400 mg via INTRAVENOUS
  Filled 2015-12-29: qty 200

## 2015-12-29 MED ORDER — LEVOTHYROXINE SODIUM 75 MCG PO TABS
75.0000 ug | ORAL_TABLET | Freq: Every day | ORAL | Status: DC
  Administered 2015-12-30 – 2016-01-03 (×5): 75 ug via ORAL
  Filled 2015-12-29 (×7): qty 1

## 2015-12-29 MED ORDER — SODIUM CHLORIDE 0.9 % IV BOLUS (SEPSIS)
1000.0000 mL | Freq: Once | INTRAVENOUS | Status: AC
  Administered 2015-12-29: 1000 mL via INTRAVENOUS

## 2015-12-29 MED ORDER — FENTANYL CITRATE (PF) 100 MCG/2ML IJ SOLN
INTRAMUSCULAR | Status: AC | PRN
  Administered 2015-12-29 (×2): 25 ug via INTRAVENOUS
  Administered 2015-12-29: 50 ug via INTRAVENOUS

## 2015-12-29 MED ORDER — HYDROMORPHONE HCL 1 MG/ML IJ SOLN: 0.5000 mg | INTRAMUSCULAR | Status: DC | PRN

## 2015-12-29 MED ORDER — ACETAMINOPHEN 650 MG RE SUPP: 650.0000 mg | Freq: Four times a day (QID) | RECTAL | Status: DC | PRN

## 2015-12-29 MED ORDER — ASPIRIN EC 81 MG PO TBEC
81.0000 mg | DELAYED_RELEASE_TABLET | Freq: Every day | ORAL | Status: DC
  Administered 2015-12-30 – 2016-01-03 (×5): 81 mg via ORAL
  Filled 2015-12-29 (×5): qty 1

## 2015-12-29 MED ORDER — ACETAMINOPHEN 325 MG PO TABS
650.0000 mg | ORAL_TABLET | Freq: Four times a day (QID) | ORAL | Status: DC | PRN
  Filled 2015-12-29: qty 2

## 2015-12-29 MED ORDER — FENTANYL CITRATE (PF) 100 MCG/2ML IJ SOLN
INTRAMUSCULAR | Status: AC
  Filled 2015-12-29: qty 4

## 2015-12-29 MED ORDER — MIDAZOLAM HCL 2 MG/2ML IJ SOLN
INTRAMUSCULAR | Status: AC | PRN
  Administered 2015-12-29 (×4): 1 mg via INTRAVENOUS

## 2015-12-29 MED ORDER — HYDROCODONE-ACETAMINOPHEN 5-325 MG PO TABS
1.0000 | ORAL_TABLET | ORAL | Status: DC | PRN
  Administered 2015-12-29: 1 via ORAL
  Filled 2015-12-29: qty 1

## 2015-12-29 MED ORDER — METOPROLOL TARTRATE 100 MG PO TABS
100.0000 mg | ORAL_TABLET | Freq: Two times a day (BID) | ORAL | Status: DC
  Administered 2015-12-29 – 2016-01-03 (×10): 100 mg via ORAL
  Filled 2015-12-29 (×11): qty 1

## 2015-12-29 MED ORDER — INSULIN ASPART 100 UNIT/ML ~~LOC~~ SOLN
0.0000 [IU] | SUBCUTANEOUS | Status: DC
  Administered 2015-12-29 – 2015-12-30 (×2): 3 [IU] via SUBCUTANEOUS
  Administered 2015-12-31: 2 [IU] via SUBCUTANEOUS
  Administered 2016-01-01: 3 [IU] via SUBCUTANEOUS
  Administered 2016-01-01 – 2016-01-02 (×3): 2 [IU] via SUBCUTANEOUS
  Administered 2016-01-03: 3 [IU] via SUBCUTANEOUS

## 2015-12-29 MED ORDER — DILTIAZEM HCL ER COATED BEADS 240 MG PO CP24
240.0000 mg | ORAL_CAPSULE | Freq: Every day | ORAL | Status: DC
  Administered 2015-12-29 – 2016-01-03 (×5): 240 mg via ORAL
  Filled 2015-12-29 (×6): qty 1

## 2015-12-29 MED ORDER — CLONAZEPAM 0.5 MG PO TABS
0.5000 mg | ORAL_TABLET | Freq: Three times a day (TID) | ORAL | Status: DC | PRN
  Administered 2015-12-31 – 2016-01-03 (×4): 0.5 mg via ORAL
  Filled 2015-12-29 (×4): qty 1

## 2015-12-29 MED ORDER — PANTOPRAZOLE SODIUM 40 MG PO TBEC
80.0000 mg | DELAYED_RELEASE_TABLET | Freq: Every day | ORAL | Status: DC
  Administered 2015-12-30 – 2015-12-31 (×2): 80 mg via ORAL
  Filled 2015-12-29 (×2): qty 2

## 2015-12-29 MED ORDER — MIDAZOLAM HCL 2 MG/2ML IJ SOLN
INTRAMUSCULAR | Status: AC
  Filled 2015-12-29: qty 6

## 2015-12-29 MED ORDER — ENOXAPARIN SODIUM 40 MG/0.4ML ~~LOC~~ SOLN
40.0000 mg | SUBCUTANEOUS | Status: DC
  Administered 2015-12-29 – 2016-01-02 (×5): 40 mg via SUBCUTANEOUS
  Filled 2015-12-29 (×6): qty 0.4

## 2015-12-29 MED ORDER — LOSARTAN POTASSIUM 25 MG PO TABS
25.0000 mg | ORAL_TABLET | Freq: Every day | ORAL | Status: DC
  Administered 2015-12-30 – 2016-01-03 (×5): 25 mg via ORAL
  Filled 2015-12-29 (×5): qty 1

## 2015-12-29 MED ORDER — SODIUM CHLORIDE 0.9 % IV SOLN
INTRAVENOUS | Status: DC
Start: 2015-12-29 — End: 2015-12-29
  Administered 2015-12-29: 10:00:00 via INTRAVENOUS

## 2015-12-29 MED ORDER — SODIUM CHLORIDE 0.9 % IV SOLN
INTRAVENOUS | Status: DC
  Administered 2015-12-29 – 2016-01-02 (×7): via INTRAVENOUS

## 2015-12-29 MED ORDER — ROSUVASTATIN CALCIUM 5 MG PO TABS
5.0000 mg | ORAL_TABLET | Freq: Every day | ORAL | Status: DC
  Administered 2015-12-30 – 2016-01-02 (×3): 5 mg via ORAL
  Filled 2015-12-29 (×5): qty 1

## 2015-12-29 MED ORDER — CIPROFLOXACIN IN D5W 400 MG/200ML IV SOLN
400.0000 mg | Freq: Two times a day (BID) | INTRAVENOUS | Status: DC
  Administered 2015-12-29 – 2016-01-03 (×10): 400 mg via INTRAVENOUS
  Filled 2015-12-29 (×10): qty 200

## 2015-12-29 MED ORDER — LIDOCAINE HCL 1 % IJ SOLN
INTRAMUSCULAR | Status: AC
  Filled 2015-12-29: qty 20

## 2015-12-29 MED ORDER — IOHEXOL 300 MG/ML  SOLN
10.0000 mL | Freq: Once | INTRAMUSCULAR | Status: AC | PRN
  Administered 2015-12-29: 10 mL

## 2015-12-29 MED ORDER — IOHEXOL 300 MG/ML  SOLN
25.0000 mL | INTRAMUSCULAR | Status: DC
  Administered 2015-12-29: 50 mL via ORAL

## 2015-12-29 MED ORDER — HYDROMORPHONE HCL 1 MG/ML IJ SOLN
0.5000 mg | INTRAMUSCULAR | Status: DC | PRN
  Administered 2015-12-29: 0.5 mg via INTRAVENOUS
  Filled 2015-12-29: qty 1

## 2015-12-29 MED ORDER — ONDANSETRON HCL 4 MG/2ML IJ SOLN
4.0000 mg | Freq: Once | INTRAMUSCULAR | Status: AC
  Administered 2015-12-29: 4 mg via INTRAVENOUS
  Filled 2015-12-29: qty 2

## 2015-12-29 MED ORDER — OXYCODONE HCL 5 MG PO TABS
5.0000 mg | ORAL_TABLET | ORAL | Status: DC | PRN
  Administered 2015-12-30 – 2016-01-03 (×6): 5 mg via ORAL
  Filled 2015-12-29 (×7): qty 1

## 2015-12-29 MED ORDER — ONDANSETRON HCL 4 MG PO TABS: 4.0000 mg | ORAL_TABLET | Freq: Four times a day (QID) | ORAL | Status: DC | PRN

## 2015-12-29 MED ORDER — HYDROMORPHONE HCL 1 MG/ML IJ SOLN: 1.0000 mg | INTRAMUSCULAR | Status: DC | PRN

## 2015-12-29 NOTE — Procedures (Signed)
Interventional Radiology Procedure Note  Procedure: Placement of transhepatic percutaneous cholecystostomy tube, 10.68F.   Complications: None  Estimated Blood Loss: None  Recommendations: - Drain to gravity - Interval cholecystectomy at surgery's discretion  Signed,  Criselda Peaches, MD

## 2015-12-29 NOTE — Consult Note (Signed)
Chief Complaint: RUQ abdominal pain Referring Physician:Dr. Eddie Dibbles Toth/CCS HPI: Sherri Burns is an 80 y.o. female who has a h/o RCC, s/p right nephrectomy, HTN, DM, who began having RUQ and epigastric abdominal pain on Saturday after eating collard greens.  She developed N/V.  She had never had anything like this before except some back pain.  No further emesis until Wednesday after trying to eat again.  She denies fevers, but admits to occasional chills and sweats.  She denies CP, SOB, dysuria.  She presented to the [facility] today with persistent worsening RUQ/epigastric abdominal pain.  Her CT scan is consistent with cholecystitis and an [place] that is equivocal.  General surgery saw the patient and given the duration of her symptoms and prior open RUQ surgery, they felt the safest plan of action was a percutaneous cholecystostomy drain.  Therefore, we have been asked to see this patient.  Past Medical History:  Past Medical History  Diagnosis Date  . Hypertension   . GERD (gastroesophageal reflux disease)     + hpylori  . Hyperlipidemia   . Anxiety   . Cancer ([place]) 15 yrs ago    ho renal s/p nephrectomy  . Diabetes mellitus   . Elevated TSH   . Elevated serum creatinine   . Ischemic colitis ([place])   . Renal insufficiency   . Colon polyps 11/2008    tubular adenoma  . Esophageal problem     esophageal dilation    Past Surgical History:  Past Surgical History  Procedure Laterality Date  . Nephrectomy      R  . Partial hysterectomy  1978  . Colonoscopy      Family History:  Family History  Problem Relation Age of Onset  . Cancer Mother     colon, kidney cancer  . Cancer Father     throat cancer    Social History:  reports that she has been smoking Cigarettes.  She has a 27.5 pack-year smoking history. She has never used smokeless tobacco. She reports that she does not drink alcohol or use illicit drugs.  Allergies:  Allergies  Allergen Reactions  .  Ciprocin-Fluocin-Procin [Fluocinolone Acetonide]     unknown  . Clonidine Derivatives     Dry mouth  . Codeine     hallucinations  . Crestor [Rosuvastatin Calcium]     cramps  . Penicillins     Has patient had a PCN reaction causing immediate rash, facial/tongue/throat swelling, SOB or lightheadedness with hypotension: no Has patient had a PCN reaction causing severe rash involving mucus membranes or skin necrosis: no Has patient had a PCN reaction that required hospitalization : no Has patient had a PCN reaction occurring within the last 10 years: no -Hallucinates If all of the above answers are "NO", then may proceed with Cephalosporin use.   . Simvastatin     Upset stomach   . Tessalon Perles     Gi upset     Medications:   Medication List    ASK your doctor about these medications        aspirin 81 MG tablet  Take 81 mg by mouth daily.     clonazePAM 1 MG tablet  Commonly known as:  KLONOPIN  Take 1/2 tab in AM and 1/2 tab in afternoon and 1 at HS     diltiazem 240 MG 24 hr capsule  Commonly known as:  CARTIA XT  Take 1 capsule (240 mg total) by mouth daily.  fluticasone 50 MCG/ACT nasal spray  Commonly known as:  FLONASE  USE 2 SPRAYS IN EACH NOSTRIL EVERY DAY     furosemide 40 MG tablet  Commonly known as:  LASIX  TAKE 1 TABLET BY MOUTH EVERY DAY     glipiZIDE 10 MG 24 hr tablet  Commonly known as:  GLUCOTROL XL  TAKE 1 TABLET BY MOUTH TWICE DAILY     levothyroxine 75 MCG tablet  Commonly known as:  SYNTHROID, LEVOTHROID  Take 1 tablet (75 mcg total) by mouth daily before breakfast.     losartan 25 MG tablet  Commonly known as:  COZAAR  Take 1 tablet daily     metFORMIN 500 MG 24 hr tablet  Commonly known as:  GLUCOPHAGE-XR  TAKE 1 TABLET BY MOUTH A DAY FOR THE FIRST 2 WEEKS AND IF TOLERATED FROM A GI STANDPOINT INCREASE TO 2 TABLETS DAILY     metoprolol 100 MG tablet  Commonly known as:  LOPRESSOR  TAKE 1 TABLET BY MOUTH TWICE DAILY      miconazole 2 % cream  Commonly known as:  MICATIN  Apply 1 application topically 2 (two) times daily.     mupirocin ointment 2 %  Commonly known as:  BACTROBAN  APPLY TO THE AREAS AROUND THE BASE OF THE GREAT TOENAIL TWICE DAILY     omeprazole 40 MG capsule  Commonly known as:  PRILOSEC  Take 40 mg by mouth daily.     rosuvastatin 5 MG tablet  Commonly known as:  CRESTOR  Take 1 tablet (5 mg total) by mouth daily.     VITAMIN D-3 PO  Take 1 tablet by mouth daily.     vitamin E 100 UNIT capsule  Take 100 Units by mouth daily.        Please HPI for pertinent positives, otherwise complete 10 system ROS negative, except for occasional headaches.  Mallampati Score: MD Evaluation Airway: WNL Heart: WNL Abdomen: WNL Chest/ Lungs: WNL ASA  Classification: 3 Mallampati/Airway Score: One  Physical Exam: BP 170/90 mmHg  Pulse 68  Temp(Src) 98.4 F (36.9 C) (Oral)  Resp 16  SpO2 100% There is no weight on file to calculate BMI.  General: pleasant, obese black female who is laying in bed in NAD HEENT: head is normocephalic, atraumatic.  Sclera are noninjected.  PERRL.  Ears and nose without any masses or lesions.  Mouth is pink and moist Heart: regular, rate, and rhythm.  Normal s1,s2. No obvious murmurs, gallops, or rubs noted.  Palpable radial and pedal pulses bilaterally Lungs: CTAB, no wheezes, rhonchi, or rales noted.  Respiratory effort nonlabored Abd: soft, tender in RUQ with + Murphy's sign, ND, +BS, no masses, hernias, or organomegaly.  Large scar from epigastrium through to her back on right side. MS: all 4 extremities are symmetrical with no cyanosis, clubbing, or edema. Skin: warm and dry with no masses, lesions, or rashes Psych: A&Ox3 with an appropriate affect.   Labs: Results for orders placed or performed during the hospital encounter of 12/29/15 (from the past 48 hour(s))  Comprehensive metabolic panel     Status: Abnormal   Collection Time: 12/29/15  8:21  AM  Result Value Ref Range   Sodium 140 135 - 145 mmol/L   Potassium 4.4 3.5 - 5.1 mmol/L   Chloride 103 101 - 111 mmol/L   CO2 28 22 - 32 mmol/L   Glucose, Bld 209 (H) 65 - 99 mg/dL   BUN 16 6 - 20 mg/dL  Creatinine, Ser 1.39 (H) 0.44 - 1.00 mg/dL   Calcium 9.3 8.9 - 10.3 mg/dL   Total Protein 7.9 6.5 - 8.1 g/dL   Albumin 4.3 3.5 - 5.0 g/dL   AST 18 15 - 41 U/L   ALT 16 14 - 54 U/L   Alkaline Phosphatase 70 38 - 126 U/L   Total Bilirubin 0.7 0.3 - 1.2 mg/dL   GFR calc non Af Amer 36 (L) >60 mL/min   GFR calc Af Amer 41 (L) >60 mL/min    Comment: (NOTE) The eGFR has been calculated using the CKD EPI equation. This calculation has not been validated in all clinical situations. eGFR's persistently <60 mL/min signify possible Chronic Kidney Disease.    Anion gap 9 5 - 15  Lipase, blood     Status: None   Collection Time: 12/29/15  8:21 AM  Result Value Ref Range   Lipase 37 11 - 51 U/L  CBC WITH DIFFERENTIAL     Status: Abnormal   Collection Time: 12/29/15  8:21 AM  Result Value Ref Range   WBC 10.7 (H) 4.0 - 10.5 K/uL   RBC 3.91 3.87 - 5.11 MIL/uL   Hemoglobin 12.4 12.0 - 15.0 g/dL   HCT 37.4 36.0 - 46.0 %   MCV 95.7 78.0 - 100.0 fL   MCH 31.7 26.0 - 34.0 pg   MCHC 33.2 30.0 - 36.0 g/dL   RDW 13.9 11.5 - 15.5 %   Platelets 199 150 - 400 K/uL   Neutrophils Relative % 83 %   Neutro Abs 8.9 (H) 1.7 - 7.7 K/uL   Lymphocytes Relative 13 %   Lymphs Abs 1.4 0.7 - 4.0 K/uL   Monocytes Relative 3 %   Monocytes Absolute 0.4 0.1 - 1.0 K/uL   Eosinophils Relative 1 %   Eosinophils Absolute 0.1 0.0 - 0.7 K/uL   Basophils Relative 0 %   Basophils Absolute 0.0 0.0 - 0.1 K/uL  POC occult blood, ED     Status: None   Collection Time: 12/29/15  8:58 AM  Result Value Ref Range   Fecal Occult Bld NEGATIVE NEGATIVE  Urinalysis, Routine w reflex microscopic (not at [facility])     Status: Abnormal   Collection Time: 12/29/15  9:02 AM  Result Value Ref Range   Color, Urine YELLOW YELLOW     APPearance CLEAR CLEAR   Specific Gravity, Urine 1.013 1.005 - 1.030   pH 7.5 5.0 - 8.0   Glucose, UA 250 (A) NEGATIVE mg/dL   Hgb urine dipstick TRACE (A) NEGATIVE   Bilirubin Urine NEGATIVE NEGATIVE   Ketones, ur NEGATIVE NEGATIVE mg/dL   Protein, ur 30 (A) NEGATIVE mg/dL   Nitrite NEGATIVE NEGATIVE   Leukocytes, UA SMALL (A) NEGATIVE  Urine microscopic-add on     Status: Abnormal   Collection Time: 12/29/15  9:02 AM  Result Value Ref Range   Squamous Epithelial / LPF 0-5 (A) NONE SEEN   WBC, UA 6-30 0 - 5 WBC/hpf   RBC / HPF 0-5 0 - 5 RBC/hpf   Bacteria, UA FEW (A) NONE SEEN    Imaging: Ct Abdomen Pelvis Wo Contrast  12/29/2015  CLINICAL DATA:  Abdominal pain and emesis EXAM: CT ABDOMEN AND PELVIS WITHOUT CONTRAST TECHNIQUE: Multidetector CT imaging of the abdomen and pelvis was performed following the standard protocol without IV contrast. COMPARISON:  06/29/2013 FINDINGS: Lung bases are free of acute infiltrate or sizable effusion. The liver, spleen, adrenal glands and pancreas are within normal limits.  The right kidney has been surgically removed. The left kidney shows no obstructive changes. A tiny likely hyperdense cyst is noted in the midportion of the left kidney. The gallbladder is well distended demonstrates some wall thickening/ pericholecystic fluid. In the appropriate clinical setting these changes would be consistent with acute cholecystitis. No significant lymphadenopathy is noted. Aortoiliac calcifications are noted. The appendix is within normal limits. The bladder is well distended. The uterus has been surgically removed. Mild diverticular change is noted without evidence of diverticulitis. The bony structures show no acute abnormality. IMPRESSION: Changes suggestive of acute cholecystitis. Right upper quadrant ultrasound would be helpful for further evaluation. The remainder of the exam is stable from the prior study. Electronically Signed   By: Inez Catalina M.D.   On:  12/29/2015 11:47   Dg Abd Acute W/chest  12/29/2015  CLINICAL DATA:  Nausea, vomiting, mid abdominal pain intermittently since Saturday, blood in emesis Saturday, occasional smoker, history renal cancer post nephrectomy, diabetes mellitus, hypertension, GERD, smoker EXAM: DG ABDOMEN ACUTE W/ 1V CHEST COMPARISON:  None; correlation CT abdomen pelvis 06/29/2013, CT chest 12/20/2009 FINDINGS: Upper normal heart size with minimal pulmonary vascular congestion. Tortuous aorta. Mediastinal contours normal. Bibasilar atelectasis without infiltrate, pleural effusion, or pneumothorax. Surgical clips RIGHT abdomen from prior nephrectomy. Nonobstructive bowel gas pattern. Scattered stool throughout colon with prominent stool within a low lying cecum in pelvis. No bowel dilatation, bowel wall thickening, or free intraperitoneal air. Pelvic phleboliths and few atherosclerotic calcifications are stable. Bones demineralized. IMPRESSION: Bibasilar atelectasis. No acute abdominal findings. Electronically Signed   By: Lavonia Dana M.D.   On: 12/29/2015 09:33   US Abdomen Limited Ruq  12/29/2015  CLINICAL DATA:  Right upper quadrant pain for 1 week EXAM: US ABDOMEN LIMITED - RIGHT UPPER QUADRANT COMPARISON:  None. FINDINGS: Gallbladder: Sludge and gallstones are identified in the gallbladder. The gallbladder wall measures 3.8 mm. There is pericholecystic fluid. No sonographic Murphy sign noted by sonographer. Common bile duct: Diameter: 5.3 mm Liver: There is diffuse increased echotexture of the liver. IMPRESSION: Sludge in gallstone identified within the gallbladder with pericholecystic fluid and gallbladder wall thickness of 3.8 mm. The sonographer reports a negative sonographic Murphy sign. The findings are equivocal for acute cholecystitis. Diffuse increased echotexture of the liver, nonspecific but can be seen in fatty infiltration of liver. Electronically Signed   By: Abelardo Diesel M.D.   On: 12/29/2015 14:53     Assessment/Plan 1. Acute cholecystitis -Case d/w Dr. Laurence Ferrari.  Will plan on placement of a perc chole drain today.  She will get a dose of Cipro prior to proceeding with her drain placement. -further plans for interval cholecystectomy in 6-8 weeks per general surgery. -Risks and Benefits discussed with the patient including bleeding, infection, damage to adjacent structures, bowel perforation/fistula connection, and sepsis. All of the patient's questions were answered, patient is agreeable to proceed. Consent signed and in chart.   Thank you for this interesting consult.  I greatly enjoyed meeting JOSCELINE CHENARD and look forward to participating in their care.  A copy of this report was sent to the requesting provider on this date.  Electronically Signed: Henreitta Cea 12/29/2015, 4:04 PM   I spent a total of 20 Minutes   in face to face in clinical consultation, greater than 50% of which was counseling/coordinating care for acute cholecystitis, perc chole drain placement

## 2015-12-29 NOTE — ED Notes (Signed)
Patient transported to X-ray. Patient going to IR for a chole drain.

## 2015-12-29 NOTE — ED Notes (Signed)
MD in room

## 2015-12-29 NOTE — ED Notes (Signed)
Patient transported to Ultrasound 

## 2015-12-29 NOTE — H&P (Signed)
History and Physical  Patient Name: Sherri Burns     [id number]    DOB: 1936-06-11    DOA: 12/29/2015 Referring physician: Adrian Prows, MD and Quintella Baton, PA-C PCP: Jenny Reichmann, MD      Chief Complaint: Abdominal pain and vomiting  HPI: Sherri Burns is a 80 y.o. female with a past medical history significant for NIDDM, HTN, renal CA remote, and CKD who presents with acute cholecystitis.  The patient was in her usual state of health until 5 days ago when she had a greasy meal and a couple hours after had several episodes of emesis followed by severe right upper quadrant abdominal pain overnight.   The next 3 days she had a very light diet , her pain was mostly resolved , and she had no more emesis until yesterday , when she had a large meal of chicken in the afternoon followed by chicken again in the evening. She again started vomiting after that meal and having right upper quadrant and epigastric abdominal pain and being up all night with pain and vomiting, so she came to the ER today. She describes the vomit both times as being bloody,  Bright red. She denies fever, chills, diarrhea. She currently has malaise, myalgias , nausea , abdominal pain.  In the ED, the patient was afebrile, hemodynamically stable. Transaminases and bilirubin normal. WBC 10.7 K. Lipase normal. FOBT rectal negative.   A CT of the abdomen and pelvis with contrast showed cholecystitis but no other remarkable findings. A follow-up ultrasound of the right upper quadrant showed pericholecystic fluid, thickened gallbladder wall, but no Murphy's sign and was read as equivocal for cholecystitis. General surgery were consulted who diagnosed acute calculous cholecystitis but did not recommend surgery in the acute setting, and asked IR to evaluate the patient for percutaneous drain.   The patient was administered ciprofloxacin, a percutaneous drain was subsequently placed, and TRH were asked to evaluate for  admission.     Review of Systems:  All other systems negative except as just noted or noted in the history of present illness.  Allergies  Allergen Reactions  . Ciprocin-Fluocin-Procin [Fluocinolone Acetonide]     unknown  . Clonidine Derivatives     Dry mouth  . Codeine     hallucinations  . Crestor [Rosuvastatin Calcium]     cramps  . Penicillins     Has patient had a PCN reaction causing immediate rash, facial/tongue/throat swelling, SOB or lightheadedness with hypotension: no Has patient had a PCN reaction causing severe rash involving mucus membranes or skin necrosis: no Has patient had a PCN reaction that required hospitalization : no Has patient had a PCN reaction occurring within the last 10 years: no -Hallucinates If all of the above answers are "NO", then may proceed with Cephalosporin use.   . Simvastatin     Upset stomach   . Tessalon Perles     Gi upset     Prior to Admission medications   Medication Sig Start Date End Date Taking? Authorizing Provider  aspirin 81 MG tablet Take 81 mg by mouth daily.   Yes Historical Provider, MD  Cholecalciferol (VITAMIN D-3 PO) Take 1 tablet by mouth daily.   Yes Historical Provider, MD  clonazePAM (KLONOPIN) 1 MG tablet Take 1/2 tab in AM and 1/2 tab in afternoon and 1 at [facility] Patient taking differently: Take 0.5-1 mg by mouth 3 (three) times daily as needed for anxiety (sleep). Take 1/2 tab in AM and  1/2 tab in afternoon and 1 at [facility] 11/29/15  Yes [name], MD  diltiazem (CARTIA XT) 240 MG 24 hr capsule Take 1 capsule (240 mg total) by mouth daily. 11/29/15  Yes [name], MD  fluticasone (FLONASE) 50 MCG/ACT nasal spray USE 2 SPRAYS IN EACH NOSTRIL EVERY DAY Patient taking differently: USE 2 SPRAYS IN EACH NOSTRIL once daily as needed for allergies 10/11/14  Yes [name], MD  furosemide (LASIX) 40 MG tablet TAKE 1 TABLET BY MOUTH EVERY DAY 11/07/15  Yes [name], MD  glipiZIDE (GLUCOTROL XL) 10 MG 24 hr tablet  TAKE 1 TABLET BY MOUTH TWICE DAILY 11/29/15  Yes [name], MD  levothyroxine (SYNTHROID, LEVOTHROID) 75 MCG tablet Take 1 tablet (75 mcg total) by mouth daily before breakfast. 11/07/15  Yes [name], MD  losartan (COZAAR) 25 MG tablet Take 1 tablet daily 07/28/15  Yes [name], MD  metoprolol (LOPRESSOR) 100 MG tablet TAKE 1 TABLET BY MOUTH TWICE DAILY 11/29/15  Yes [name], MD  miconazole (MICATIN) 2 % cream Apply 1 application topically 2 (two) times daily. Patient taking differently: Apply 1 application topically 2 (two) times daily as needed (irritation).  11/09/14  Yes Shawnee Knapp, MD  mupirocin ointment (BACTROBAN) 2 % APPLY TO THE AREAS AROUND THE BASE OF THE GREAT TOENAIL TWICE DAILY Patient taking differently: APPLY TO THE AREAS AROUND THE BASE OF THE GREAT TOENAIL twice daily as needed for breakouts on feet/toes 03/02/15  Yes [name], MD  omeprazole (PRILOSEC) 40 MG capsule Take 40 mg by mouth daily.   Yes Historical Provider, MD  rosuvastatin (CRESTOR) 5 MG tablet Take 1 tablet (5 mg total) by mouth daily. 04/28/15  Yes [name], MD  vitamin E 100 UNIT capsule Take 100 Units by mouth daily.   Yes Historical Provider, MD  metFORMIN (GLUCOPHAGE-XR) 500 MG 24 hr tablet TAKE 1 TABLET BY MOUTH A DAY FOR THE FIRST 2 WEEKS AND IF TOLERATED FROM A GI STANDPOINT INCREASE TO 2 TABLETS DAILY Patient not taking: Reported on 07/28/2015 04/28/15   [name], MD    Past Medical History  Diagnosis Date  . Hypertension   . GERD (gastroesophageal reflux disease)     + hpylori  . Hyperlipidemia   . Anxiety   . Cancer ([place]) 15 yrs ago    ho renal s/p nephrectomy  . Diabetes mellitus   . Elevated TSH   . Elevated serum creatinine   . Ischemic colitis ([place])   . Renal insufficiency   . Colon polyps 11/2008    tubular adenoma  . Esophageal problem     esophageal dilation    Past Surgical History  Procedure Laterality Date  . Nephrectomy      R  . Partial hysterectomy   1978  . Colonoscopy    . Esophageal dilation  2016    Family history: family history includes Cancer in her father and mother.  Social History: Patient lives by herself.  She smokes.  She does not use a cane or a walker.  She is independent with all ADLs and IADLs.       Physical Exam: BP 150/101 mmHg  Pulse 82  Temp(Src) 98.4 F (36.9 C) (Oral)  Resp 18  SpO2 98% General appearance: Elderly obese female, alert and in no acute distress.   Eyes: Anicteric, conjunctiva pink, lids and lashes normal.     ENT: No nasal deformity,  discharge, or epistaxis.  OP moist with large and red tonsils.   Lymph: No cervical or supraclavicular lymphadenopathy.  Tender over left neck, no mass. Skin: Warm and dry.  No jaundice.  No suspicious rashes or lesions.  Drain in place on right.  Dishydrotic eczema on hands, feet. Cardiac: RRR, nl S1-S2, no murmurs appreciated.  Capillary refill is brisk.  No JVD. No LE edema.  Radial and DP pulses 2+ and symmetric. Respiratory: Normal respiratory rate and rhythm.  CTAB without rales or wheezes. Abdomen: Abdomen soft without rigidity.  Moderate TTP on RUQ and epigastrium, referred pain with palpation in left side, no guarding. No ascites, distension.   MSK: No deformities or effusions. Neuro: Sensorium intact and responding to questions, attention normal.  Speech is fluent.  Moves all extremities equally and with normal coordination.    Psych: Behavior appropriate.  Affect normal.  No evidence of aural or visual hallucinations or delusions.       Labs on Admission:  The metabolic panel shows  Normal sodium, potassium, bicarbonate. Serum creatinine 1.4 mg/dL, near baseline at 1.3 mg/dL.  Hyperglycemia. Transaminases and bilirubin normal. Lipase normal.  FOBT negative. Urinalysis shows scant WBCs and bacteria. PTT and INR normal. The complete blood count shows mild leukocytosis, 10.7 K/uL, hemoglobin 12.4, no thrombocytopenia.   Radiological Exams on  Admission: Personally reviewed: Dg Abd Acute W/chest 12/29/2015   No airspace disease.  Normal bowel gas pattern.   Ct Abdomen Pelvis Wo Contrast 12/29/2015 The right kidney has been surgically removed. The left kidney shows no obstructive changes. A tiny likely hyperdense cyst is noted in the midportion of the left kidney. The gallbladder is well distended demonstrates some wall thickening/ pericholecystic fluid. In the appropriate clinical setting these changes would be consistent with acute cholecystitis. The bladder is well distended. The uterus has been surgically removed. Mild diverticular change is noted without evidence of diverticulitis.   US Abdomen Limited Ruq 12/29/2015  IMPRESSION: Sludge in gallstone identified within the gallbladder with pericholecystic fluid and gallbladder wall thickness of 3.8 mm. The sonographer reports a negative sonographic Murphy sign. The findings are equivocal for acute cholecystitis. Diffuse increased echotexture of the liver, nonspecific but can be seen in fatty infiltration of liver. Electronically Signed   By: Abelardo Diesel M.D.   On: 12/29/2015 14:53   Ir Perc Cholecystostomy 12/29/2015   IMPRESSION: Successful placement of a transhepatic percutaneous cholecystostomy tube for calculus cholecystitis. Signed, Criselda Peaches, MD [facility] Electronically Signed   By: Jacqulynn Cadet M.D.   On: 12/29/2015 17:04          Assessment/Plan 1. Acute cholecystitis:  CT abdomen suggests cholecystitis, [place] with stones but equivocal exam.  Perc drain now in place.   -Ciprofloxacin 400 mg BID IV -Follow culture -NPO except meds -Consult to General Surgery, appreciate cares -Hydromorphone for pain PRN -Ondansetron for nausea -Trend LFT   2. Hypothyroidism:  -Continue home levothyroxine  3. CKD stage III and history of renal CA s/p R nephrectomy in 1988:  -Monitor renal function with  daily BMP  4. HTN:  -Continue metoprolol, diltiazem, losartan, and aspirin and statin -Hold furosemide while on IVF -Strict I/Os and daily weights  5. NIDDM:  -Hold glipizide -Patient reporst not taking metformin -Sliding scale corrections q4hrs and start Lantus while inpatient if BG > 180 on 2 consecutive measurements  6. Anxiety:  -Continue home clonazepam PRN  7. GERD :  -Continue home PPI  DVT PPx: Lovenox Diet: NPO for now Consultants: General Surgery Code Status: Full Family Communication: Daughter at bedside, CODE STATUS confirmed.  All questions answered.  Plan for overnight antibiotics, PT eval and follow up per general surgery discussed. Medical decision making: What exists of the patient's previous chart was reviewed in depth and the case was discussed with Dr. Vanita Panda. Patient seen 8:33 PM on 12/29/2015.  Disposition Plan:  I recommend admission to medical surgical bed, inpatietn status.  Clinical condition: stable.  Anticipate IV antibiotics, IVF and bowel rest.  Re-evaluation and disposition per General Surgery after that.      Edwin Dada [facility] Pager [phone]

## 2015-12-29 NOTE — Consult Note (Signed)
[facility] Consult Note  Sherri Burns 04/20/1936  [id number].    Requesting MD: Dr. Tomi Bamberger Chief Complaint/Reason for Consult: Abdominal pain, abnormal gallbladder  HPI:  80 y/o AA female smoker with PMH DM, HTN, HLD, hyperthyroidism, ischemic colitis, renal insufficiency, and H/o renal cancer s/p right nephrectomy presents to [facility] with acute onset of mid abdominal pain since Saturday.  She had some collard greens made with a lot of grease and she became nauseous and vomited non-bilious/non-bloody greens mixed with water.  She has had the pain on and off since then.  Denies CP/SOB, fever/chills, diarrhea.  No alleviating factors, no radiating pain.  She admits to having some RUQ pain in the past, but never anything she sought care for.  Last night the pain became progressively worse and she threw up several more times which prompted her to seek care in the [facility].  She has a h/o right nephrectomy/rib, vaginal hysterectomy and excision of a mass around her umbilicus.    CT obtained which was concerning for acute cholecystitis, but no stones were identified.  No ultrasound done previously.  Her WBC is 10.7, LFT's are all normal.  Cr. 1.39.  Glucose 209.  She "never checks her sugar because she's afraid of needles".  Blood pressure got up to 205/105, afebrile.  We were consulted to give surgical recommendations as it relates to the gallbladder.    ROS: All systems reviewed and otherwise negative except for as above  Family History  Problem Relation Age of Onset  . Cancer Mother     colon, kidney cancer  . Cancer Father     throat cancer    Past Medical History  Diagnosis Date  . Hypertension   . GERD (gastroesophageal reflux disease)     + hpylori  . Hyperlipidemia   . Anxiety   . Cancer ([place]) 15 yrs ago    ho renal s/p nephrectomy  . Diabetes mellitus   . Elevated TSH   . Elevated serum creatinine   . Ischemic colitis ([place])   . Renal insufficiency   . Colon polyps  11/2008    tubular adenoma    Past Surgical History  Procedure Laterality Date  . Nephrectomy      R  . Partial hysterectomy  1978    Social History:  reports that she has been smoking Cigarettes.  She has a 27.5 pack-year smoking history. She has never used smokeless tobacco. She reports that she does not drink alcohol or use illicit drugs.  Allergies:  Allergies  Allergen Reactions  . Ciprocin-Fluocin-Procin [Fluocinolone Acetonide]     unknown  . Clonidine Derivatives     Dry mouth  . Codeine     hallucinations  . Crestor [Rosuvastatin Calcium]     cramps  . Penicillins     Has patient had a PCN reaction causing immediate rash, facial/tongue/throat swelling, SOB or lightheadedness with hypotension: no Has patient had a PCN reaction causing severe rash involving mucus membranes or skin necrosis: no Has patient had a PCN reaction that required hospitalization : no Has patient had a PCN reaction occurring within the last 10 years: no -Hallucinates If all of the above answers are "NO", then may proceed with Cephalosporin use.   . Simvastatin     Upset stomach   . Tessalon Perles     Gi upset      (Not in a hospital admission)  Blood pressure 173/95, pulse 71, temperature 98.4 F (36.9 C), temperature source Oral,  resp. rate 16, SpO2 98 %. Physical Exam: General: pleasant, WD/WN AA female who is laying in bed in NAD HEENT: head is normocephalic, atraumatic.  Sclera are noninjected.  PERRL.  Ears and nose without any masses or lesions.  Mouth is pink and moist Heart: regular, rate, and rhythm.  No obvious murmurs, gallops, or rubs noted.  Palpable pedal pulses bilaterally Lungs: CTAB, no wheezes, rhonchi, or rales noted.  Respiratory effort nonlabored Abd: soft, quite tender in the RUQ, mildly tender in the mid abdomen, +BS, no masses, gallbladder palpable.  Large horizontal scar of the right upper abdomen/flank, scars around the umbilicus. MS: all 4 extremities are  symmetrical with no cyanosis, clubbing, or edema. Skin: warm and dry with no masses, lesions, or rashes.  Bottoms of feet are dry with what looks like tinea with lots of scale and peeling.   Psych: A&Ox3 with an appropriate affect.   Results for orders placed or performed during the hospital encounter of 12/29/15 (from the past 48 hour(s))  Comprehensive metabolic panel     Status: Abnormal   Collection Time: 12/29/15  8:21 AM  Result Value Ref Range   Sodium 140 135 - 145 mmol/L   Potassium 4.4 3.5 - 5.1 mmol/L   Chloride 103 101 - 111 mmol/L   CO2 28 22 - 32 mmol/L   Glucose, Bld 209 (H) 65 - 99 mg/dL   BUN 16 6 - 20 mg/dL   Creatinine, Ser 1.39 (H) 0.44 - 1.00 mg/dL   Calcium 9.3 8.9 - 10.3 mg/dL   Total Protein 7.9 6.5 - 8.1 g/dL   Albumin 4.3 3.5 - 5.0 g/dL   AST 18 15 - 41 U/L   ALT 16 14 - 54 U/L   Alkaline Phosphatase 70 38 - 126 U/L   Total Bilirubin 0.7 0.3 - 1.2 mg/dL   GFR calc non Af Amer 36 (L) >60 mL/min   GFR calc Af Amer 41 (L) >60 mL/min    Comment: (NOTE) The eGFR has been calculated using the CKD EPI equation. This calculation has not been validated in all clinical situations. eGFR's persistently <60 mL/min signify possible Chronic Kidney Disease.    Anion gap 9 5 - 15  Lipase, blood     Status: None   Collection Time: 12/29/15  8:21 AM  Result Value Ref Range   Lipase 37 11 - 51 U/L  CBC WITH DIFFERENTIAL     Status: Abnormal   Collection Time: 12/29/15  8:21 AM  Result Value Ref Range   WBC 10.7 (H) 4.0 - 10.5 K/uL   RBC 3.91 3.87 - 5.11 MIL/uL   Hemoglobin 12.4 12.0 - 15.0 g/dL   HCT 37.4 36.0 - 46.0 %   MCV 95.7 78.0 - 100.0 fL   MCH 31.7 26.0 - 34.0 pg   MCHC 33.2 30.0 - 36.0 g/dL   RDW 13.9 11.5 - 15.5 %   Platelets 199 150 - 400 K/uL   Neutrophils Relative % 83 %   Neutro Abs 8.9 (H) 1.7 - 7.7 K/uL   Lymphocytes Relative 13 %   Lymphs Abs 1.4 0.7 - 4.0 K/uL   Monocytes Relative 3 %   Monocytes Absolute 0.4 0.1 - 1.0 K/uL   Eosinophils  Relative 1 %   Eosinophils Absolute 0.1 0.0 - 0.7 K/uL   Basophils Relative 0 %   Basophils Absolute 0.0 0.0 - 0.1 K/uL  POC occult blood, ED     Status: None   Collection Time: 12/29/15  8:58 AM  Result Value Ref Range   Fecal Occult Bld NEGATIVE NEGATIVE  Urinalysis, Routine w reflex microscopic (not at [facility])     Status: Abnormal   Collection Time: 12/29/15  9:02 AM  Result Value Ref Range   Color, Urine YELLOW YELLOW   APPearance CLEAR CLEAR   Specific Gravity, Urine 1.013 1.005 - 1.030   pH 7.5 5.0 - 8.0   Glucose, UA 250 (A) NEGATIVE mg/dL   Hgb urine dipstick TRACE (A) NEGATIVE   Bilirubin Urine NEGATIVE NEGATIVE   Ketones, ur NEGATIVE NEGATIVE mg/dL   Protein, ur 30 (A) NEGATIVE mg/dL   Nitrite NEGATIVE NEGATIVE   Leukocytes, UA SMALL (A) NEGATIVE  Urine microscopic-add on     Status: Abnormal   Collection Time: 12/29/15  9:02 AM  Result Value Ref Range   Squamous Epithelial / LPF 0-5 (A) NONE SEEN   WBC, UA 6-30 0 - 5 WBC/hpf   RBC / HPF 0-5 0 - 5 RBC/hpf   Bacteria, UA FEW (A) NONE SEEN   Ct Abdomen Pelvis Wo Contrast  12/29/2015  CLINICAL DATA:  Abdominal pain and emesis EXAM: CT ABDOMEN AND PELVIS WITHOUT CONTRAST TECHNIQUE: Multidetector CT imaging of the abdomen and pelvis was performed following the standard protocol without IV contrast. COMPARISON:  06/29/2013 FINDINGS: Lung bases are free of acute infiltrate or sizable effusion. The liver, spleen, adrenal glands and pancreas are within normal limits. The right kidney has been surgically removed. The left kidney shows no obstructive changes. A tiny likely hyperdense cyst is noted in the midportion of the left kidney. The gallbladder is well distended demonstrates some wall thickening/ pericholecystic fluid. In the appropriate clinical setting these changes would be consistent with acute cholecystitis. No significant lymphadenopathy is noted. Aortoiliac calcifications are noted. The appendix is within normal limits. The  bladder is well distended. The uterus has been surgically removed. Mild diverticular change is noted without evidence of diverticulitis. The bony structures show no acute abnormality. IMPRESSION: Changes suggestive of acute cholecystitis. Right upper quadrant ultrasound would be helpful for further evaluation. The remainder of the exam is stable from the prior study. Electronically Signed   By: Inez Catalina M.D.   On: 12/29/2015 11:47   Dg Abd Acute W/chest  12/29/2015  CLINICAL DATA:  Nausea, vomiting, mid abdominal pain intermittently since Saturday, blood in emesis Saturday, occasional smoker, history renal cancer post nephrectomy, diabetes mellitus, hypertension, GERD, smoker EXAM: DG ABDOMEN ACUTE W/ 1V CHEST COMPARISON:  None; correlation CT abdomen pelvis 06/29/2013, CT chest 12/20/2009 FINDINGS: Upper normal heart size with minimal pulmonary vascular congestion. Tortuous aorta. Mediastinal contours normal. Bibasilar atelectasis without infiltrate, pleural effusion, or pneumothorax. Surgical clips RIGHT abdomen from prior nephrectomy. Nonobstructive bowel gas pattern. Scattered stool throughout colon with prominent stool within a low lying cecum in pelvis. No bowel dilatation, bowel wall thickening, or free intraperitoneal air. Pelvic phleboliths and few atherosclerotic calcifications are stable. Bones demineralized. IMPRESSION: Bibasilar atelectasis. No acute abdominal findings. Electronically Signed   By: Lavonia Dana M.D.   On: 12/29/2015 09:33      Assessment/Plan RUQ abdominal pain Large edematous gallbladder & Cholelithiasis -Symptoms have been going on for almost a week.  LFT's normal, WBC only mildly elevated.  Suspect this is acute on chronic.  [place] just done does show stones.  Plan for Perc chole tube and medical management -Admit to medicine, we will consult -Due to her co-morbidities, age, and how large the GB is she may require an open procedure.  We would like to avoid this in the acute  setting if possible.  Would recommend perc chole drain and IV antibiotics.  Possible interval cholecystectomy in 6-8 weeks. -NPO, bowel rest, IVF, pain control, antiemetics, antibiotics (cipro) -SCD's and lovenox or heparin for DVT proph okay with [place] -Ambulate and IS -Will follow  Multiple medical problems - uncontrolled HTN/DM, CKD per medicine   Nat Christen, [facility] Surgery 12/29/2015, 1:53 PM Pager: [phone]

## 2015-12-29 NOTE — ED Notes (Signed)
Patient transported to X-ray 

## 2015-12-29 NOTE — ED Provider Notes (Signed)
CSN: [id number]     Arrival date & time 12/29/15  [id number] History   First MD Initiated Contact with Patient 12/29/15 [phone]     Chief Complaint  Patient presents with  . Abdominal Pain  . Emesis   Patient is a 80 y.o. female presenting with abdominal pain and vomiting. The history is provided by the patient.  Abdominal Pain Pain location:  Epigastric Pain quality comment:  Its just painful Pain radiates to:  Does not radiate Pain severity:  Severe Onset quality:  Gradual Duration:  1 day Timing:  Constant Chronicity:  New Relieved by:  Nothing Associated symptoms: hematemesis, nausea and vomiting   Associated symptoms: no diarrhea, no dysuria and no fever   Emesis Associated symptoms: abdominal pain   Associated symptoms: no diarrhea   The emesis is mucus with blood streaks.  She feels weak all over as well.  She has had trouble with her stomach in the past.  She sees Dr Allyn Kenner.  She has a history of esophageal issues and had a dilitation previously.  Past Medical History  Diagnosis Date  . Hypertension   . GERD (gastroesophageal reflux disease)     + hpylori  . Hyperlipidemia   . Anxiety   . Cancer ([place]) 15 yrs ago    ho renal s/p nephrectomy  . Diabetes mellitus   . Elevated TSH   . Elevated serum creatinine   . Ischemic colitis ([place])   . Renal insufficiency   . Colon polyps 11/2008    tubular adenoma   Past Surgical History  Procedure Laterality Date  . Nephrectomy      R  . Partial hysterectomy  1978   Family History  Problem Relation Age of Onset  . Cancer Mother     colon, kidney cancer  . Cancer Father     throat cancer   Social History  Substance Use Topics  . Smoking status: Current Some Day Smoker -- 0.50 packs/day for 55 years    Types: Cigarettes  . Smokeless tobacco: Never Used     Comment: cut back amount still  trying to quit  . Alcohol Use: No   OB History    No data available     Review of Systems  Constitutional: Negative for fever.   Gastrointestinal: Positive for nausea, vomiting, abdominal pain and hematemesis. Negative for diarrhea.  Genitourinary: Negative for dysuria.  All other systems reviewed and are negative.     Allergies  Ciprocin-fluocin-procin; Clonidine derivatives; Codeine; Crestor; Penicillins; Simvastatin; and Tessalon perles  Home Medications   Prior to Admission medications   Medication Sig Start Date End Date Taking? Authorizing Provider  aspirin 81 MG tablet Take 81 mg by mouth daily.   Yes Historical Provider, MD  Cholecalciferol (VITAMIN D-3 PO) Take 1 tablet by mouth daily.   Yes Historical Provider, MD  clonazePAM (KLONOPIN) 1 MG tablet Take 1/2 tab in AM and 1/2 tab in afternoon and 1 at [facility] Patient taking differently: Take 0.5-1 mg by mouth 3 (three) times daily as needed for anxiety (sleep). Take 1/2 tab in AM and 1/2 tab in afternoon and 1 at [facility] 11/29/15  Yes [name], MD  diltiazem (CARTIA XT) 240 MG 24 hr capsule Take 1 capsule (240 mg total) by mouth daily. 11/29/15  Yes [name], MD  fluticasone (FLONASE) 50 MCG/ACT nasal spray USE 2 SPRAYS IN EACH NOSTRIL EVERY DAY Patient taking differently: USE 2 SPRAYS IN EACH NOSTRIL once daily as needed for  allergies 10/11/14  Yes [name], MD  furosemide (LASIX) 40 MG tablet TAKE 1 TABLET BY MOUTH EVERY DAY 11/07/15  Yes [name], MD  glipiZIDE (GLUCOTROL XL) 10 MG 24 hr tablet TAKE 1 TABLET BY MOUTH TWICE DAILY 11/29/15  Yes [name], MD  levothyroxine (SYNTHROID, LEVOTHROID) 75 MCG tablet Take 1 tablet (75 mcg total) by mouth daily before breakfast. 11/07/15  Yes [name], MD  losartan (COZAAR) 25 MG tablet Take 1 tablet daily 07/28/15  Yes [name], MD  metoprolol (LOPRESSOR) 100 MG tablet TAKE 1 TABLET BY MOUTH TWICE DAILY 11/29/15  Yes [name], MD  miconazole (MICATIN) 2 % cream Apply 1 application topically 2 (two) times daily. Patient taking differently: Apply 1 application topically 2 (two) times daily as  needed (irritation).  11/09/14  Yes [place] , MD  mupirocin ointment (BACTROBAN) 2 % APPLY TO THE AREAS AROUND THE BASE OF THE GREAT TOENAIL TWICE DAILY Patient taking differently: APPLY TO THE AREAS AROUND THE BASE OF THE GREAT TOENAIL twice daily as needed for breakouts on feet/toes 03/02/15  Yes [name], MD  omeprazole (PRILOSEC) 40 MG capsule Take 40 mg by mouth daily.   Yes Historical Provider, MD  rosuvastatin (CRESTOR) 5 MG tablet Take 1 tablet (5 mg total) by mouth daily. 04/28/15  Yes [name], MD  vitamin E 100 UNIT capsule Take 100 Units by mouth daily.   Yes Historical Provider, MD  metFORMIN (GLUCOPHAGE-XR) 500 MG 24 hr tablet TAKE 1 TABLET BY MOUTH A DAY FOR THE FIRST 2 WEEKS AND IF TOLERATED FROM A GI STANDPOINT INCREASE TO 2 TABLETS DAILY Patient not taking: Reported on 07/28/2015 04/28/15   [name], MD   BP 169/84 mmHg  Pulse 64  Temp(Src) 98.4 F (36.9 C) (Oral)  Resp 18  SpO2 96% Physical Exam  Constitutional: No distress.  HENT:  Head: Normocephalic and atraumatic.  Right Ear: External ear normal.  Left Ear: External ear normal.  Eyes: Conjunctivae are normal. Right eye exhibits no discharge. Left eye exhibits no discharge. No scleral icterus.  Neck: Neck supple. No tracheal deviation present.  Cardiovascular: Normal rate, regular rhythm and intact distal pulses.   Pulmonary/Chest: Effort normal and breath sounds normal. No stridor. No respiratory distress. She has no wheezes. She has no rales.  Abdominal: Soft. Bowel sounds are normal. She exhibits no distension, no pulsatile midline mass and no mass. There is tenderness in the right upper quadrant and epigastric area. There is guarding and CVA tenderness. There is no rigidity and no rebound. No hernia.  Musculoskeletal: She exhibits no edema or tenderness.  Neurological: She is alert. She has normal strength. No cranial nerve deficit (no facial droop, extraocular movements intact, no slurred speech) or  sensory deficit. She exhibits normal muscle tone. She displays no seizure activity. Coordination normal.  Skin: Skin is warm and dry. No rash noted.  Psychiatric: She has a normal mood and affect.  Nursing note and vitals reviewed.   ED Course  Procedures (including critical care time) Labs Review Labs Reviewed  COMPREHENSIVE METABOLIC PANEL - Abnormal; Notable for the following:    Glucose, Bld 209 (*)    Creatinine, Ser 1.39 (*)    GFR calc non Af Amer 36 (*)    GFR calc Af Amer 41 (*)    All other components within normal limits  CBC WITH DIFFERENTIAL/PLATELET - Abnormal; Notable for the following:    WBC  10.7 (*)    Neutro Abs 8.9 (*)    All other components within normal limits  URINALYSIS, ROUTINE W REFLEX MICROSCOPIC (NOT AT [facility]) - Abnormal; Notable for the following:    Glucose, UA 250 (*)    Hgb urine dipstick TRACE (*)    Protein, ur 30 (*)    Leukocytes, UA SMALL (*)    All other components within normal limits  URINE MICROSCOPIC-ADD ON - Abnormal; Notable for the following:    Squamous Epithelial / LPF 0-5 (*)    Bacteria, UA FEW (*)    All other components within normal limits  LIPASE, BLOOD  POC OCCULT BLOOD, ED    Imaging Review Ct Abdomen Pelvis Wo Contrast  12/29/2015  CLINICAL DATA:  Abdominal pain and emesis EXAM: CT ABDOMEN AND PELVIS WITHOUT CONTRAST TECHNIQUE: Multidetector CT imaging of the abdomen and pelvis was performed following the standard protocol without IV contrast. COMPARISON:  06/29/2013 FINDINGS: Lung bases are free of acute infiltrate or sizable effusion. The liver, spleen, adrenal glands and pancreas are within normal limits. The right kidney has been surgically removed. The left kidney shows no obstructive changes. A tiny likely hyperdense cyst is noted in the midportion of the left kidney. The gallbladder is well distended demonstrates some wall thickening/ pericholecystic fluid. In the appropriate clinical setting these changes would be  consistent with acute cholecystitis. No significant lymphadenopathy is noted. Aortoiliac calcifications are noted. The appendix is within normal limits. The bladder is well distended. The uterus has been surgically removed. Mild diverticular change is noted without evidence of diverticulitis. The bony structures show no acute abnormality. IMPRESSION: Changes suggestive of acute cholecystitis. Right upper quadrant ultrasound would be helpful for further evaluation. The remainder of the exam is stable from the prior study. Electronically Signed   By: Inez Catalina M.D.   On: 12/29/2015 11:47   Dg Abd Acute W/chest  12/29/2015  CLINICAL DATA:  Nausea, vomiting, mid abdominal pain intermittently since Saturday, blood in emesis Saturday, occasional smoker, history renal cancer post nephrectomy, diabetes mellitus, hypertension, GERD, smoker EXAM: DG ABDOMEN ACUTE W/ 1V CHEST COMPARISON:  None; correlation CT abdomen pelvis 06/29/2013, CT chest 12/20/2009 FINDINGS: Upper normal heart size with minimal pulmonary vascular congestion. Tortuous aorta. Mediastinal contours normal. Bibasilar atelectasis without infiltrate, pleural effusion, or pneumothorax. Surgical clips RIGHT abdomen from prior nephrectomy. Nonobstructive bowel gas pattern. Scattered stool throughout colon with prominent stool within a low lying cecum in pelvis. No bowel dilatation, bowel wall thickening, or free intraperitoneal air. Pelvic phleboliths and few atherosclerotic calcifications are stable. Bones demineralized. IMPRESSION: Bibasilar atelectasis. No acute abdominal findings. Electronically Signed   By: Lavonia Dana M.D.   On: 12/29/2015 09:33   I have personally reviewed and evaluated these images and lab results as part of my medical decision-making.   Medications  sodium chloride 0.9 % bolus 1,000 mL (0 mLs Intravenous Stopped 12/29/15 1027)    And  0.9 %  sodium chloride infusion ( Intravenous New Bag/Given 12/29/15 1027)  HYDROmorphone  (DILAUDID) injection 0.5 mg (0.5 mg Intravenous Given 12/29/15 0834)  iohexol (OMNIPAQUE) 300 MG/ML solution 25 mL (50 mLs Oral Contrast Given 12/29/15 1043)  ondansetron (ZOFRAN) injection 4 mg (4 mg Intravenous Given 12/29/15 0831)  12:30 PM GEN surg consult  MDM   Final diagnoses:  Acute cholecystitis   CT scab results are concerning for acute cholecystitis.  Pt does have persistent ttp in the epigastrum and RUQ.  Her CT scan does correlate with clinical  findings.  I will consult with general surgery.      Dorie Rank, MD 01/01/16 [phone]

## 2015-12-29 NOTE — Sedation Documentation (Signed)
Patient denies pain and is resting comfortably.  

## 2015-12-29 NOTE — ED Notes (Signed)
Received report from [place] in IR.

## 2015-12-29 NOTE — ED Notes (Signed)
Pt c/o emesis episode last week and over the weekend, abdominal pain.

## 2015-12-30 DIAGNOSIS — K8000 Calculus of gallbladder with acute cholecystitis without obstruction: Secondary | ICD-10-CM | POA: Diagnosis present

## 2015-12-30 LAB — CBC
HEMATOCRIT: 37.3 % (ref 36.0–46.0)
Hemoglobin: 12.1 g/dL (ref 12.0–15.0)
MCH: 31.8 pg (ref 26.0–34.0)
MCHC: 32.4 g/dL (ref 30.0–36.0)
MCV: 97.9 fL (ref 78.0–100.0)
Platelets: 177 10*3/uL (ref 150–400)
RBC: 3.81 MIL/uL — ABNORMAL LOW (ref 3.87–5.11)
RDW: 14 % (ref 11.5–15.5)
WBC: 10 10*3/uL (ref 4.0–10.5)

## 2015-12-30 LAB — COMPREHENSIVE METABOLIC PANEL
ALBUMIN: 3.7 g/dL (ref 3.5–5.0)
ALT: 14 U/L (ref 14–54)
AST: 19 U/L (ref 15–41)
Alkaline Phosphatase: 55 U/L (ref 38–126)
Anion gap: 10 (ref 5–15)
BUN: 12 mg/dL (ref 6–20)
CHLORIDE: 105 mmol/L (ref 101–111)
CO2: 25 mmol/L (ref 22–32)
Calcium: 9 mg/dL (ref 8.9–10.3)
Creatinine, Ser: 1.15 mg/dL — ABNORMAL HIGH (ref 0.44–1.00)
GFR calc Af Amer: 52 mL/min — ABNORMAL LOW (ref >60)
GFR calc non Af Amer: 45 mL/min — ABNORMAL LOW (ref >60)
GLUCOSE: 152 mg/dL — AB (ref 65–99)
POTASSIUM: 4 mmol/L (ref 3.5–5.1)
Sodium: 140 mmol/L (ref 135–145)
Total Bilirubin: 0.7 mg/dL (ref 0.3–1.2)
Total Protein: 6.8 g/dL (ref 6.5–8.1)

## 2015-12-30 LAB — GLUCOSE, CAPILLARY
Glucose-Capillary: 152 mg/dL — ABNORMAL HIGH (ref 65–99)
Glucose-Capillary: 59 mg/dL — ABNORMAL LOW (ref 65–99)
Glucose-Capillary: 71 mg/dL (ref 65–99)
Glucose-Capillary: 72 mg/dL (ref 65–99)
Glucose-Capillary: 73 mg/dL (ref 65–99)
Glucose-Capillary: 79 mg/dL (ref 65–99)

## 2015-12-30 LAB — TSH: TSH: 0.55 u[IU]/mL (ref 0.350–4.500)

## 2015-12-30 NOTE — Progress Notes (Signed)
Patient ID: Sherri Burns, female   DOB: November 06, 1936, 80 y.o.   MRN: [id number]    Referring Physician(s): CCS  Chief Complaint:  cholecystitis  Subjective: Pt doing ok; has some soreness at GB drain site as expected; currently denies N/V   Allergies: Ciprocin-fluocin-procin; Clonidine derivatives; Codeine; Crestor; Penicillins; Simvastatin; and Tessalon perles  Medications: Prior to Admission medications   Medication Sig Start Date End Date Taking? Authorizing Provider  aspirin 81 MG tablet Take 81 mg by mouth daily.   Yes Historical Provider, MD  Cholecalciferol (VITAMIN D-3 PO) Take 1 tablet by mouth daily.   Yes Historical Provider, MD  clonazePAM (KLONOPIN) 1 MG tablet Take 1/2 tab in AM and 1/2 tab in afternoon and 1 at [facility] Patient taking differently: Take 0.5-1 mg by mouth 3 (three) times daily as needed for anxiety (sleep). Take 1/2 tab in AM and 1/2 tab in afternoon and 1 at [facility] 11/29/15  Yes [name], MD  diltiazem (CARTIA XT) 240 MG 24 hr capsule Take 1 capsule (240 mg total) by mouth daily. 11/29/15  Yes [name], MD  fluticasone (FLONASE) 50 MCG/ACT nasal spray USE 2 SPRAYS IN EACH NOSTRIL EVERY DAY Patient taking differently: USE 2 SPRAYS IN EACH NOSTRIL once daily as needed for allergies 10/11/14  Yes [name], MD  furosemide (LASIX) 40 MG tablet TAKE 1 TABLET BY MOUTH EVERY DAY 11/07/15  Yes [name], MD  glipiZIDE (GLUCOTROL XL) 10 MG 24 hr tablet TAKE 1 TABLET BY MOUTH TWICE DAILY 11/29/15  Yes [name], MD  levothyroxine (SYNTHROID, LEVOTHROID) 75 MCG tablet Take 1 tablet (75 mcg total) by mouth daily before breakfast. 11/07/15  Yes [name], MD  losartan (COZAAR) 25 MG tablet Take 1 tablet daily 07/28/15  Yes [name], MD  metoprolol (LOPRESSOR) 100 MG tablet TAKE 1 TABLET BY MOUTH TWICE DAILY 11/29/15  Yes [name], MD  miconazole (MICATIN) 2 % cream Apply 1 application topically 2 (two) times daily. Patient taking differently: Apply  1 application topically 2 (two) times daily as needed (irritation).  11/09/14  Yes Shawnee Knapp, MD  mupirocin ointment (BACTROBAN) 2 % APPLY TO THE AREAS AROUND THE BASE OF THE GREAT TOENAIL TWICE DAILY Patient taking differently: APPLY TO THE AREAS AROUND THE BASE OF THE GREAT TOENAIL twice daily as needed for breakouts on feet/toes 03/02/15  Yes [name], MD  omeprazole (PRILOSEC) 40 MG capsule Take 40 mg by mouth daily.   Yes Historical Provider, MD  rosuvastatin (CRESTOR) 5 MG tablet Take 1 tablet (5 mg total) by mouth daily. 04/28/15  Yes [name], MD  vitamin E 100 UNIT capsule Take 100 Units by mouth daily.   Yes Historical Provider, MD  metFORMIN (GLUCOPHAGE-XR) 500 MG 24 hr tablet TAKE 1 TABLET BY MOUTH A DAY FOR THE FIRST 2 WEEKS AND IF TOLERATED FROM A GI STANDPOINT INCREASE TO 2 TABLETS DAILY Patient not taking: Reported on 07/28/2015 04/28/15   [name], MD     Vital Signs: BP 117/62 mmHg  Pulse 72  Temp(Src) 99.4 F (37.4 C) (Oral)  Resp 16  Ht 5' 6.5" (1.689 m)  Wt 178 lb 5.6 oz (80.9 kg)  BMI 28.36 kg/m2  SpO2 92%  Physical Exam GB drain intact, insertion site ok, mild- mod tender, no leaking, output 150 cc dark bile; cx's pend  Imaging: Ct Abdomen Pelvis Wo Contrast  12/29/2015  CLINICAL DATA:  Abdominal pain  and emesis EXAM: CT ABDOMEN AND PELVIS WITHOUT CONTRAST TECHNIQUE: Multidetector CT imaging of the abdomen and pelvis was performed following the standard protocol without IV contrast. COMPARISON:  06/29/2013 FINDINGS: Lung bases are free of acute infiltrate or sizable effusion. The liver, spleen, adrenal glands and pancreas are within normal limits. The right kidney has been surgically removed. The left kidney shows no obstructive changes. A tiny likely hyperdense cyst is noted in the midportion of the left kidney. The gallbladder is well distended demonstrates some wall thickening/ pericholecystic fluid. In the appropriate clinical setting these changes would be  consistent with acute cholecystitis. No significant lymphadenopathy is noted. Aortoiliac calcifications are noted. The appendix is within normal limits. The bladder is well distended. The uterus has been surgically removed. Mild diverticular change is noted without evidence of diverticulitis. The bony structures show no acute abnormality. IMPRESSION: Changes suggestive of acute cholecystitis. Right upper quadrant ultrasound would be helpful for further evaluation. The remainder of the exam is stable from the prior study. Electronically Signed   By: Inez Catalina M.D.   On: 12/29/2015 11:47   Ir Perc Cholecystostomy  12/29/2015  INDICATION: 80 year old female with acute cholecystitis and symptoms for the past week. She is a poorly controlled diabetic and in the setting of prolonged symptoms a relatively poor surgical candidate with a high risk for conversion to open cholecystectomy. Percutaneous cholecystostomy tube placement is recommended at this time followed by interval elective laparoscopic cholecystectomy. EXAM: CHOLECYSTOSTOMY MEDICATIONS: Ciprofloxacin 400 mg IV; The antibiotic was administered within an appropriate time frame prior to the initiation of the procedure. ANESTHESIA/SEDATION: Moderate (conscious) sedation was employed during this procedure. A total of Versed 4 mg and Fentanyl 100 mcg was administered intravenously. Moderate Sedation Time: 12 minutes. The patient's level of consciousness and vital signs were monitored continuously by radiology nursing throughout the procedure under my direct supervision. FLUOROSCOPY TIME:  Fluoroscopy Time: 1 minutes 18 seconds (66.4 mGy). COMPLICATIONS: None immediate. Estimated blood loss: None PROCEDURE: Informed written consent was obtained from the patient after a thorough discussion of the procedural risks, benefits and alternatives. All questions were addressed. Maximal Sterile Barrier Technique was utilized including caps, mask, sterile gowns, sterile  gloves, sterile drape, hand hygiene and skin antiseptic. A timeout was performed prior to the initiation of the procedure. The right upper quadrant was interrogated with ultrasound. The inflamed and thickened gallbladder is successfully identified. A suitable skin entry site was selected and marked. Local anesthesia was attained by infiltration with 1% lidocaine. A small dermatotomy was made. Under real-time sonographic guidance, the gallbladder was punctured using a 21 gauge Accustick needle. The puncture was carried through a short segment of hepatic parenchyma consistent with a transhepatic approach. Gallstones are visualized within the gallbladder lumen. Dark black bile was present in the needle hub after removal of the stylet. The Accustick wire was coiled within the gallbladder lumen and the needle exchanged for the Accustick sheath. A gentle hand injection of contrast material confirmed placement within the gallbladder. The Accustick sheath was then exchanged over a short Amplatz wire and the skin tract dilated to 10 [place]. A Cook 10.2 [place] all-purpose drainage catheter was then advanced over the wire and coiled within the gallbladder lumen. A sample of bile was aspirated and will be sent for culture. A fluoroscopic image was obtained confirming the presence of the tube within the gallbladder lumen. The tube was secured to the skin with 0 Prolene suture and connected to gravity bag drainage. IMPRESSION: Successful placement of  a transhepatic percutaneous cholecystostomy tube for calculus cholecystitis. Signed, Criselda Peaches, MD [facility] Electronically Signed   By: Jacqulynn Cadet M.D.   On: 12/29/2015 17:04   Dg Abd Acute W/chest  12/29/2015  CLINICAL DATA:  Nausea, vomiting, mid abdominal pain intermittently since Saturday, blood in emesis Saturday, occasional smoker, history renal cancer post nephrectomy, diabetes mellitus,  hypertension, GERD, smoker EXAM: DG ABDOMEN ACUTE W/ 1V CHEST COMPARISON:  None; correlation CT abdomen pelvis 06/29/2013, CT chest 12/20/2009 FINDINGS: Upper normal heart size with minimal pulmonary vascular congestion. Tortuous aorta. Mediastinal contours normal. Bibasilar atelectasis without infiltrate, pleural effusion, or pneumothorax. Surgical clips RIGHT abdomen from prior nephrectomy. Nonobstructive bowel gas pattern. Scattered stool throughout colon with prominent stool within a low lying cecum in pelvis. No bowel dilatation, bowel wall thickening, or free intraperitoneal air. Pelvic phleboliths and few atherosclerotic calcifications are stable. Bones demineralized. IMPRESSION: Bibasilar atelectasis. No acute abdominal findings. Electronically Signed   By: Lavonia Dana M.D.   On: 12/29/2015 09:33   US Abdomen Limited Ruq  12/29/2015  CLINICAL DATA:  Right upper quadrant pain for 1 week EXAM: US ABDOMEN LIMITED - RIGHT UPPER QUADRANT COMPARISON:  None. FINDINGS: Gallbladder: Sludge and gallstones are identified in the gallbladder. The gallbladder wall measures 3.8 mm. There is pericholecystic fluid. No sonographic Murphy sign noted by sonographer. Common bile duct: Diameter: 5.3 mm Liver: There is diffuse increased echotexture of the liver. IMPRESSION: Sludge in gallstone identified within the gallbladder with pericholecystic fluid and gallbladder wall thickness of 3.8 mm. The sonographer reports a negative sonographic Murphy sign. The findings are equivocal for acute cholecystitis. Diffuse increased echotexture of the liver, nonspecific but can be seen in fatty infiltration of liver. Electronically Signed   By: Abelardo Diesel M.D.   On: 12/29/2015 14:53    Labs:  CBC:  Recent Labs  07/28/15 1021 10/21/15 1318 12/29/15 0821 12/30/15 0448  WBC 7.4 6.1 10.7* 10.0  HGB 13.2 13.3 12.4 12.1  HCT 39.4 40.6 37.4 37.3  PLT 207  --  199 177    COAGS:  Recent Labs  12/29/15 1731  INR 0.98    APTT 25    BMP:  Recent Labs  04/27/15 1044 07/28/15 1021 12/29/15 0821 12/30/15 0448  NA 140 140 140 140  K 4.5 4.3 4.4 4.0  CL 102 101 103 105  CO2 31 29 28 25   GLUCOSE 184* 178* 209* 152*  BUN 18 18 16 12   CALCIUM 9.5 9.5 9.3 9.0  CREATININE 1.44* 1.26* 1.39* 1.15*  GFRNONAA 35* 41* 36* 45*  GFRAA 41* 48* 41* 52*    LIVER FUNCTION TESTS:  Recent Labs  04/27/15 1044 12/29/15 0821 12/30/15 0448  BILITOT 0.7 0.7 0.7  AST 16 18 19   ALT 16 16 14   ALKPHOS 71 70 55  PROT 7.0 7.9 6.8  ALBUMIN 4.0 4.3 3.7    Assessment and Plan: S/p perc cholecystostomy 2/2; mild temp elevation at 99.4; WBC nl; hgb stable;  check final bile cx's; cont drain irrigation; drain will need to remain in place for at least 4-6 weeks unless cholecystectomy done in interim.   Electronically Signed: D. Rowe Robert 12/30/2015, 1:35 PM   I spent a total of 15 minutes at the the patient's bedside AND on the patient's hospital floor or unit, greater than 50% of which was counseling/coordinating care for percutaneous cholecystostomy

## 2015-12-30 NOTE — Progress Notes (Signed)
PROGRESS NOTE  Sherri Burns [id number] DOB: Jan 20, 1936 DOA: 12/29/2015 PCP: Jenny Reichmann, MD Brief History 80 yo female with history of NIDDM, HTN, CKD 3, R-nephrectomy secondary to renal cancer presented with 5 day history of nausea, vomiting,right upper quadrant and epigastricabdominal pain. This started on 12/24/2015 after eating a greasy meal.the pain improved over the next several days as the patient ate lighter meals. However, the patient's pain returned  On 12/28/2015 after eating chicken. She continued to have nausea and vomiting.  She stated that she also had some blood-tinged emesis. She has subjective fevers and chills. She denied any chest discomfort, shortness breath, hematochezia, melena.In the emergency department, workup revealed cholecystitis. General surgery was consulted,and they recommended a percutaneous drain which was placed by interventional radiology 12/29/2015. Assessment/Plan: Acute cholecystitis -12/29/2015 CT abdomen and pelvis--GB thickening anddistended with pericholecystic fluid -12/29/2015 RUQ US--thickened gallbladderith sludge and pericholecystic fluid -12/29/2015 percutaneous drain -as this appears to be a community-acquired of mild to moderate severity,appropriate to continue ciprofloxacin for now -If no clinical improvement, add metronidazole or broadened to Zosyn -Follow biliary culture -Continue intravenous fluids -Pain control -Advance diet per general surgery CKD stage III -baseline creatinine 1.1-1.4 Diabetes mellitus type 2 -11/29/2015 hemoglobin A1c 6.9 -Patient no longer takes metformin -Hold glipizide -NovoLog sliding scale Hypertension -Continue diltiazem CD, losartan, metoprolol tartrate -hold furosemide Anxiety -Continue home dose clonazepam prn Hypothyroidism -Continue Synthroid -TSH 0.550 Hyperlipidemia -Continue statin   Family Communication:   Pt at beside Disposition Plan:   Home 1-2 days when cleared by  surgery       Procedures/Studies: Ct Abdomen Pelvis Wo Contrast  12/29/2015  CLINICAL DATA:  Abdominal pain and emesis EXAM: CT ABDOMEN AND PELVIS WITHOUT CONTRAST TECHNIQUE: Multidetector CT imaging of the abdomen and pelvis was performed following the standard protocol without IV contrast. COMPARISON:  06/29/2013 FINDINGS: Lung bases are free of acute infiltrate or sizable effusion. The liver, spleen, adrenal glands and pancreas are within normal limits. The right kidney has been surgically removed. The left kidney shows no obstructive changes. A tiny likely hyperdense cyst is noted in the midportion of the left kidney. The gallbladder is well distended demonstrates some wall thickening/ pericholecystic fluid. In the appropriate clinical setting these changes would be consistent with acute cholecystitis. No significant lymphadenopathy is noted. Aortoiliac calcifications are noted. The appendix is within normal limits. The bladder is well distended. The uterus has been surgically removed. Mild diverticular change is noted without evidence of diverticulitis. The bony structures show no acute abnormality. IMPRESSION: Changes suggestive of acute cholecystitis. Right upper quadrant ultrasound would be helpful for further evaluation. The remainder of the exam is stable from the prior study. Electronically Signed   By: Inez Catalina M.D.   On: 12/29/2015 11:47   Ir Perc Cholecystostomy  12/29/2015  INDICATION: 80 year old female with acute cholecystitis and symptoms for the past week. She is a poorly controlled diabetic and in the setting of prolonged symptoms a relatively poor surgical candidate with a high risk for conversion to open cholecystectomy. Percutaneous cholecystostomy tube placement is recommended at this time followed by interval elective laparoscopic cholecystectomy. EXAM: CHOLECYSTOSTOMY MEDICATIONS: Ciprofloxacin 400 mg IV; The antibiotic was administered within an appropriate time frame prior  to the initiation of the procedure. ANESTHESIA/SEDATION: Moderate (conscious) sedation was employed during this procedure. A total of Versed 4 mg and Fentanyl 100 mcg was administered intravenously. Moderate Sedation Time: 12 minutes. The patient's level of consciousness and  vital signs were monitored continuously by radiology nursing throughout the procedure under my direct supervision. FLUOROSCOPY TIME:  Fluoroscopy Time: 1 minutes 18 seconds (66.4 mGy). COMPLICATIONS: None immediate. Estimated blood loss: None PROCEDURE: Informed written consent was obtained from the patient after a thorough discussion of the procedural risks, benefits and alternatives. All questions were addressed. Maximal Sterile Barrier Technique was utilized including caps, mask, sterile gowns, sterile gloves, sterile drape, hand hygiene and skin antiseptic. A timeout was performed prior to the initiation of the procedure. The right upper quadrant was interrogated with ultrasound. The inflamed and thickened gallbladder is successfully identified. A suitable skin entry site was selected and marked. Local anesthesia was attained by infiltration with 1% lidocaine. A small dermatotomy was made. Under real-time sonographic guidance, the gallbladder was punctured using a 21 gauge Accustick needle. The puncture was carried through a short segment of hepatic parenchyma consistent with a transhepatic approach. Gallstones are visualized within the gallbladder lumen. Dark black bile was present in the needle hub after removal of the stylet. The Accustick wire was coiled within the gallbladder lumen and the needle exchanged for the Accustick sheath. A gentle hand injection of contrast material confirmed placement within the gallbladder. The Accustick sheath was then exchanged over a short Amplatz wire and the skin tract dilated to 10 [place]. A Cook 10.2 [place] all-purpose drainage catheter was then advanced over the wire and coiled within the gallbladder  lumen. A sample of bile was aspirated and will be sent for culture. A fluoroscopic image was obtained confirming the presence of the tube within the gallbladder lumen. The tube was secured to the skin with 0 Prolene suture and connected to gravity bag drainage. IMPRESSION: Successful placement of a transhepatic percutaneous cholecystostomy tube for calculus cholecystitis. Signed, Criselda Peaches, MD [facility] Radiology Electronically Signed   By: Jacqulynn Cadet M.D.   On: 12/29/2015 17:04   Dg Abd Acute W/chest  12/29/2015  CLINICAL DATA:  Nausea, vomiting, mid abdominal pain intermittently since Saturday, blood in emesis Saturday, occasional smoker, history renal cancer post nephrectomy, diabetes mellitus, hypertension, GERD, smoker EXAM: DG ABDOMEN ACUTE W/ 1V CHEST COMPARISON:  None; correlation CT abdomen pelvis 06/29/2013, CT chest 12/20/2009 FINDINGS: Upper normal heart size with minimal pulmonary vascular congestion. Tortuous aorta. Mediastinal contours normal. Bibasilar atelectasis without infiltrate, pleural effusion, or pneumothorax. Surgical clips RIGHT abdomen from prior nephrectomy. Nonobstructive bowel gas pattern. Scattered stool throughout colon with prominent stool within a low lying cecum in pelvis. No bowel dilatation, bowel wall thickening, or free intraperitoneal air. Pelvic phleboliths and few atherosclerotic calcifications are stable. Bones demineralized. IMPRESSION: Bibasilar atelectasis. No acute abdominal findings. Electronically Signed   By: Lavonia Dana M.D.   On: 12/29/2015 09:33   US Abdomen Limited Ruq  12/29/2015  CLINICAL DATA:  Right upper quadrant pain for 1 week EXAM: US ABDOMEN LIMITED - RIGHT UPPER QUADRANT COMPARISON:  None. FINDINGS: Gallbladder: Sludge and gallstones are identified in the gallbladder. The gallbladder wall measures 3.8 mm. There is pericholecystic fluid. No sonographic Murphy sign noted by  sonographer. Common bile duct: Diameter: 5.3 mm Liver: There is diffuse increased echotexture of the liver. IMPRESSION: Sludge in gallstone identified within the gallbladder with pericholecystic fluid and gallbladder wall thickness of 3.8 mm. The sonographer reports a negative sonographic Murphy sign. The findings are equivocal for acute cholecystitis. Diffuse increased echotexture of the liver, nonspecific but can be seen in fatty infiltration of liver. Electronically Signed   By: Seward Meth  Augustin Coupe M.D.   On: 12/29/2015 14:53         Subjective: Patient states the pain is controlled. Denies any fevers, chills, chest pain, shortness breath, nausea, vomiting, diarrhea. She is passing flatus per Noble bowel movement.  Objective: Filed Vitals:   12/29/15 2000 12/29/15 2100 12/29/15 2214 12/30/15 0522  BP: 153/138 190/92 189/84 114/55  Pulse: 60 81  71  Temp:  98.2 F (36.8 C)  100.2 F (37.9 C)  TempSrc:  Oral  Oral  Resp:  18  16  Height:  5' 6.5" (1.689 m)    Weight:  78.8 kg (173 lb 11.6 oz)  80.9 kg (178 lb 5.6 oz)  SpO2: 100% 97%  93%    Intake/Output Summary (Last 24 hours) at 12/30/15 0727 Last data filed at 12/30/15 0600  Gross per 24 hour  Intake   2450 ml  Output    590 ml  Net   1860 ml   Weight change:  Exam:   General:  Pt is alert, follows commands appropriately, not in acute distress  HEENT: No icterus, No thrush, No neck mass, [place]/AT  Cardiovascular: RRR, S1/S2, no rubs, no gallops  Respiratory: ffine bibasilar crackles without wheezing. Good movement.  Abdomen: Soft/+BS, RUQ tender without rebound, non distended, no guarding  Extremities: No edema, No lymphangitis, No petechiae, No rashes, no synovitis  Data Reviewed: Basic Metabolic Panel:  Recent Labs Lab 12/29/15 0821 12/30/15 0448  NA 140 140  K 4.4 4.0  CL 103 105  CO2 28 25  GLUCOSE 209* 152*  BUN 16 12  CREATININE 1.39* 1.15*  CALCIUM 9.3 9.0   Liver Function Tests:  Recent Labs Lab  12/29/15 0821 12/30/15 0448  AST 18 19  ALT 16 14  ALKPHOS 70 55  BILITOT 0.7 0.7  PROT 7.9 6.8  ALBUMIN 4.3 3.7    Recent Labs Lab 12/29/15 0821  LIPASE 37   No results for input(s): AMMONIA in the last 168 hours. CBC:  Recent Labs Lab 12/29/15 0821 12/30/15 0448  WBC 10.7* 10.0  NEUTROABS 8.9*  --   HGB 12.4 12.1  HCT 37.4 37.3  MCV 95.7 97.9  PLT 199 177   Cardiac Enzymes: No results for input(s): CKTOTAL, CKMB, CKMBINDEX, TROPONINI in the last 168 hours. BNP: Invalid input(s): POCBNP CBG:  Recent Labs Lab 12/29/15 2131 12/29/15 2347  GLUCAP 147* 132*    Recent Results (from the past 240 hour(s))  Culture, body fluid-bottle     Status: None (Preliminary result)   Collection Time: 12/29/15  5:07 PM  Result Value Ref Range Status   Specimen Description BILE  Final   Special Requests BOTTLES DRAWN AEROBIC AND ANAEROBIC 5CC  Final   Gram Stain   Final    GRAM VARIABLE ROD IN BOTH AEROBIC AND ANAEROBIC BOTTLES CRITICAL RESULT CALLED TO, READ BACK BY AND VERIFIED WITH: A MATHIS,RN @0405  12/30/15 MKELLY Performed at [facility]    Culture PENDING  Incomplete   Report Status PENDING  Incomplete  Gram stain     Status: None   Collection Time: 12/29/15  5:07 PM  Result Value Ref Range Status   Specimen Description BILE  Final   Special Requests NONE  Final   Gram Stain   Final    RARE WBC PRESENT,BOTH PMN AND MONONUCLEAR ABUNDANT GRAM NEGATIVE RODS Gram Stain Report Called to,Read Back By and Verified With: M MATHIS RN [id number] 12/29/15 A BROWNING Performed at [facility]    Report  Status 12/29/2015 FINAL  Final     Scheduled Meds: . aspirin EC  81 mg Oral Daily  . ciprofloxacin  400 mg Intravenous Q12H  . diltiazem  240 mg Oral Daily  . enoxaparin (LOVENOX) injection  40 mg Subcutaneous Q24H  . insulin aspart  0-15 Units Subcutaneous 6 times per day  . levothyroxine  75 mcg Oral QAC breakfast  . losartan  25 mg Oral Daily  .  metoprolol  100 mg Oral BID  . pantoprazole  80 mg Oral Daily  . rosuvastatin  5 mg Oral Daily   Continuous Infusions: . sodium chloride 125 mL/hr at 12/30/15 0600     Sherri Kittle, DO  [facility] Pager [phone]  If 7PM-7AM, please contact night-coverage [email or web address] Password [email or web address] 12/30/2015, 7:27 AM   LOS: 1 day

## 2015-12-30 NOTE — Progress Notes (Signed)
Patient's blood glucose at 16:00 was 59. Patient reluctant to drink orange juice due to urinary urgency. Provided patient with pad and mesh panties and explained consequences of low blood sugar. Patient's diet was advanced to full liquids at dinner time and patient consumed a bowl of cream of potato soup and a container of vanilla pudding. Will continue to monitor blood sugar levels and educate patient.

## 2015-12-30 NOTE — Progress Notes (Signed)
[facility] Progress Note     Subjective: Pain improved.  Still very sore over drain site.  No N/V.  Thirsty.  Not been OOB yet.  Having some flatus, BM yesterday.  Perc chole drain draining well with dark green bile.    Objective: Vital signs in last 24 hours: Temp:  [98.2 F (36.8 C)-100.2 F (37.9 C)] 100.2 F (37.9 C) (02/03 0522) Pulse Rate:  [60-82] 71 (02/03 0522) Resp:  [12-20] 16 (02/03 0522) BP: (114-205)/(55-138) 114/55 mmHg (02/03 0522) SpO2:  [92 %-100 %] 93 % (02/03 0522) Weight:  [78.8 kg (173 lb 11.6 oz)-80.9 kg (178 lb 5.6 oz)] 80.9 kg (178 lb 5.6 oz) (02/03 0522) Last BM Date: 12/29/15  Intake/Output from previous day: 02/02 0701 - 02/03 0700 In: 2450 [I.V.:1050; IV Piggyback:1400] Out: 590 [Urine:400; Drains:190] Intake/Output this shift:    PE: Gen:  Alert, NAD, pleasant Abd: Soft, quite tender over RUQ drain, +BS, no HSM, perc chole drain in RUQ with dark green bilious output (122mL/24hr)  Already 1/3rd full since it was emptied this am.  Less tenderness over rest of abdomen.    Lab Results:   Recent Labs  12/29/15 0821 12/30/15 0448  WBC 10.7* 10.0  HGB 12.4 12.1  HCT 37.4 37.3  PLT 199 177   BMET  Recent Labs  12/29/15 0821 12/30/15 0448  NA 140 140  K 4.4 4.0  CL 103 105  CO2 28 25  GLUCOSE 209* 152*  BUN 16 12  CREATININE 1.39* 1.15*  CALCIUM 9.3 9.0   PT/INR  Recent Labs  12/29/15 1731  LABPROT 13.2  INR 0.98   CMP     Component Value Date/Time   NA 140 12/30/2015 0448   K 4.0 12/30/2015 0448   CL 105 12/30/2015 0448   CO2 25 12/30/2015 0448   GLUCOSE 152* 12/30/2015 0448   BUN 12 12/30/2015 0448   CREATININE 1.15* 12/30/2015 0448   CREATININE 1.26* 07/28/2015 1021   CREATININE 1.3* 10/23/2011   CALCIUM 9.0 12/30/2015 0448   PROT 6.8 12/30/2015 0448   ALBUMIN 3.7 12/30/2015 0448   AST 19 12/30/2015 0448   ALT 14 12/30/2015 0448   ALKPHOS 55 12/30/2015 0448   BILITOT 0.7 12/30/2015 0448    GFRNONAA 45* 12/30/2015 0448   GFRNONAA 41* 07/28/2015 1021   GFRAA 52* 12/30/2015 0448   GFRAA 48* 07/28/2015 1021   Lipase     Component Value Date/Time   LIPASE 37 12/29/2015 0821       Studies/Results: Ct Abdomen Pelvis Wo Contrast  12/29/2015  CLINICAL DATA:  Abdominal pain and emesis EXAM: CT ABDOMEN AND PELVIS WITHOUT CONTRAST TECHNIQUE: Multidetector CT imaging of the abdomen and pelvis was performed following the standard protocol without IV contrast. COMPARISON:  06/29/2013 FINDINGS: Lung bases are free of acute infiltrate or sizable effusion. The liver, spleen, adrenal glands and pancreas are within normal limits. The right kidney has been surgically removed. The left kidney shows no obstructive changes. A tiny likely hyperdense cyst is noted in the midportion of the left kidney. The gallbladder is well distended demonstrates some wall thickening/ pericholecystic fluid. In the appropriate clinical setting these changes would be consistent with acute cholecystitis. No significant lymphadenopathy is noted. Aortoiliac calcifications are noted. The appendix is within normal limits. The bladder is well distended. The uterus has been surgically removed. Mild diverticular change is noted without evidence of diverticulitis. The bony structures show no acute abnormality. IMPRESSION: Changes suggestive of acute cholecystitis. Right upper  quadrant ultrasound would be helpful for further evaluation. The remainder of the exam is stable from the prior study. Electronically Signed   By: Inez Catalina M.D.   On: 12/29/2015 11:47   Ir Perc Cholecystostomy  12/29/2015  INDICATION: 80 year old female with acute cholecystitis and symptoms for the past week. She is a poorly controlled diabetic and in the setting of prolonged symptoms a relatively poor surgical candidate with a high risk for conversion to open cholecystectomy. Percutaneous cholecystostomy tube placement is recommended at this time followed by  interval elective laparoscopic cholecystectomy. EXAM: CHOLECYSTOSTOMY MEDICATIONS: Ciprofloxacin 400 mg IV; The antibiotic was administered within an appropriate time frame prior to the initiation of the procedure. ANESTHESIA/SEDATION: Moderate (conscious) sedation was employed during this procedure. A total of Versed 4 mg and Fentanyl 100 mcg was administered intravenously. Moderate Sedation Time: 12 minutes. The patient's level of consciousness and vital signs were monitored continuously by radiology nursing throughout the procedure under my direct supervision. FLUOROSCOPY TIME:  Fluoroscopy Time: 1 minutes 18 seconds (66.4 mGy). COMPLICATIONS: None immediate. Estimated blood loss: None PROCEDURE: Informed written consent was obtained from the patient after a thorough discussion of the procedural risks, benefits and alternatives. All questions were addressed. Maximal Sterile Barrier Technique was utilized including caps, mask, sterile gowns, sterile gloves, sterile drape, hand hygiene and skin antiseptic. A timeout was performed prior to the initiation of the procedure. The right upper quadrant was interrogated with ultrasound. The inflamed and thickened gallbladder is successfully identified. A suitable skin entry site was selected and marked. Local anesthesia was attained by infiltration with 1% lidocaine. A small dermatotomy was made. Under real-time sonographic guidance, the gallbladder was punctured using a 21 gauge Accustick needle. The puncture was carried through a short segment of hepatic parenchyma consistent with a transhepatic approach. Gallstones are visualized within the gallbladder lumen. Dark black bile was present in the needle hub after removal of the stylet. The Accustick wire was coiled within the gallbladder lumen and the needle exchanged for the Accustick sheath. A gentle hand injection of contrast material confirmed placement within the gallbladder. The Accustick sheath was then exchanged  over a short Amplatz wire and the skin tract dilated to 10 [place]. A Cook 10.2 [place] all-purpose drainage catheter was then advanced over the wire and coiled within the gallbladder lumen. A sample of bile was aspirated and will be sent for culture. A fluoroscopic image was obtained confirming the presence of the tube within the gallbladder lumen. The tube was secured to the skin with 0 Prolene suture and connected to gravity bag drainage. IMPRESSION: Successful placement of a transhepatic percutaneous cholecystostomy tube for calculus cholecystitis. Signed, Criselda Peaches, MD [facility] Radiology Electronically Signed   By: Jacqulynn Cadet M.D.   On: 12/29/2015 17:04   Dg Abd Acute W/chest  12/29/2015  CLINICAL DATA:  Nausea, vomiting, mid abdominal pain intermittently since Saturday, blood in emesis Saturday, occasional smoker, history renal cancer post nephrectomy, diabetes mellitus, hypertension, GERD, smoker EXAM: DG ABDOMEN ACUTE W/ 1V CHEST COMPARISON:  None; correlation CT abdomen pelvis 06/29/2013, CT chest 12/20/2009 FINDINGS: Upper normal heart size with minimal pulmonary vascular congestion. Tortuous aorta. Mediastinal contours normal. Bibasilar atelectasis without infiltrate, pleural effusion, or pneumothorax. Surgical clips RIGHT abdomen from prior nephrectomy. Nonobstructive bowel gas pattern. Scattered stool throughout colon with prominent stool within a low lying cecum in pelvis. No bowel dilatation, bowel wall thickening, or free intraperitoneal air. Pelvic phleboliths and few atherosclerotic calcifications are stable.  Bones demineralized. IMPRESSION: Bibasilar atelectasis. No acute abdominal findings. Electronically Signed   By: Lavonia Dana M.D.   On: 12/29/2015 09:33   US Abdomen Limited Ruq  12/29/2015  CLINICAL DATA:  Right upper quadrant pain for 1 week EXAM: US ABDOMEN LIMITED - RIGHT UPPER QUADRANT COMPARISON:  None. FINDINGS:  Gallbladder: Sludge and gallstones are identified in the gallbladder. The gallbladder wall measures 3.8 mm. There is pericholecystic fluid. No sonographic Murphy sign noted by sonographer. Common bile duct: Diameter: 5.3 mm Liver: There is diffuse increased echotexture of the liver. IMPRESSION: Sludge in gallstone identified within the gallbladder with pericholecystic fluid and gallbladder wall thickness of 3.8 mm. The sonographer reports a negative sonographic Murphy sign. The findings are equivocal for acute cholecystitis. Diffuse increased echotexture of the liver, nonspecific but can be seen in fatty infiltration of liver. Electronically Signed   By: Abelardo Diesel M.D.   On: 12/29/2015 14:53    Anti-infectives: Anti-infectives    Start     Dose/Rate Route Frequency Ordered Stop   12/29/15 1415  ciprofloxacin (CIPRO) IVPB 400 mg     400 mg 200 mL/hr over 60 Minutes Intravenous Every 12 hours 12/29/15 1405         Assessment/Plan RUQ abdominal pain Large edematous gallbladder & Cholelithiasis -Symptoms have been going on for almost a week. LFT's normal, WBC only mildly elevated. Suspect this is acute on chronic. US showed stones. Perc chole done yesterday. -Due to her co-morbidities, age, and how large the GB is she may require an open procedure. We would like to avoid this in the acute setting if possible. This is why we recommended perc chole drain and IV antibiotics. Interval cholecystectomy in 6-8 weeks. -Allow clears, pain control, antiemetics, antibiotics (cipro) -SCD's and lovenox or heparin for DVT proph okay with [place] -Ambulate and IS -Will follow  Multiple medical problems - uncontrolled HTN/DM, CKD per medicine    LOS: 1 day    Nat Christen 12/30/2015, 7:22 AM Pager: [phone]

## 2015-12-31 DIAGNOSIS — N183 Chronic kidney disease, stage 3 (moderate): Secondary | ICD-10-CM

## 2015-12-31 DIAGNOSIS — K8000 Calculus of gallbladder with acute cholecystitis without obstruction: Secondary | ICD-10-CM

## 2015-12-31 DIAGNOSIS — K819 Cholecystitis, unspecified: Secondary | ICD-10-CM

## 2015-12-31 DIAGNOSIS — K810 Acute cholecystitis: Secondary | ICD-10-CM

## 2015-12-31 LAB — CBC
HCT: 35.9 % — ABNORMAL LOW (ref 36.0–46.0)
HEMOGLOBIN: 11.6 g/dL — AB (ref 12.0–15.0)
MCH: 31.7 pg (ref 26.0–34.0)
MCHC: 32.3 g/dL (ref 30.0–36.0)
MCV: 98.1 fL (ref 78.0–100.0)
PLATELETS: 175 10*3/uL (ref 150–400)
RBC: 3.66 MIL/uL — AB (ref 3.87–5.11)
RDW: 14.6 % (ref 11.5–15.5)
WBC: 9.1 10*3/uL (ref 4.0–10.5)

## 2015-12-31 LAB — BASIC METABOLIC PANEL
ANION GAP: 6 (ref 5–15)
BUN: 11 mg/dL (ref 6–20)
CALCIUM: 9.1 mg/dL (ref 8.9–10.3)
CO2: 25 mmol/L (ref 22–32)
CREATININE: 1.27 mg/dL — AB (ref 0.44–1.00)
Chloride: 110 mmol/L (ref 101–111)
GFR, EST AFRICAN AMERICAN: 46 mL/min — AB (ref >60)
GFR, EST NON AFRICAN AMERICAN: 40 mL/min — AB (ref >60)
Glucose, Bld: 92 mg/dL (ref 65–99)
Potassium: 4.1 mmol/L (ref 3.5–5.1)
SODIUM: 141 mmol/L (ref 135–145)

## 2015-12-31 LAB — GLUCOSE, CAPILLARY
GLUCOSE-CAPILLARY: 104 mg/dL — AB (ref 65–99)
GLUCOSE-CAPILLARY: 84 mg/dL (ref 65–99)
GLUCOSE-CAPILLARY: 137 mg/dL — AB (ref 65–99)
Glucose-Capillary: 104 mg/dL — ABNORMAL HIGH (ref 65–99)
Glucose-Capillary: 117 mg/dL — ABNORMAL HIGH (ref 65–99)
Glucose-Capillary: 82 mg/dL (ref 65–99)

## 2015-12-31 MED ORDER — POLYETHYLENE GLYCOL 3350 17 G PO PACK
17.0000 g | PACK | Freq: Every day | ORAL | Status: DC
  Administered 2015-12-31 – 2016-01-03 (×4): 17 g via ORAL
  Filled 2015-12-31 (×5): qty 1

## 2015-12-31 MED ORDER — PANTOPRAZOLE SODIUM 40 MG PO TBEC
80.0000 mg | DELAYED_RELEASE_TABLET | Freq: Every day | ORAL | Status: DC
  Administered 2016-01-01 – 2016-01-03 (×3): 80 mg via ORAL
  Filled 2015-12-31 (×4): qty 2

## 2015-12-31 MED ORDER — HYDROCERIN EX CREA
TOPICAL_CREAM | Freq: Two times a day (BID) | CUTANEOUS | Status: DC
  Administered 2015-12-31: 22:00:00 via TOPICAL
  Administered 2015-12-31: 1 via TOPICAL
  Administered 2016-01-01 (×2): via TOPICAL
  Administered 2016-01-02: 1 via TOPICAL
  Administered 2016-01-02: 10:00:00 via TOPICAL
  Filled 2015-12-31: qty 113

## 2015-12-31 MED ORDER — POLYETHYLENE GLYCOL 3350 17 G PO PACK: 17.0000 g | PACK | Freq: Every day | ORAL | Status: DC

## 2015-12-31 NOTE — Progress Notes (Signed)
[facility] Progress Note     Subjective: Pain improved.  Sore over drain site.  No N/V.      Objective: Vital signs in last 24 hours: Temp:  [99.4 F (37.4 C)-100.9 F (38.3 C)] 100.9 F (38.3 C) (02/04 0510) Pulse Rate:  [72-93] 85 (02/04 0510) Resp:  [16] 16 (02/04 0510) BP: (117-157)/(62-70) 157/70 mmHg (02/04 0510) SpO2:  [92 %-96 %] 96 % (02/04 0510) Weight:  [82.5 kg (181 lb 14.1 oz)-82.7 kg (182 lb 5.1 oz)] 82.5 kg (181 lb 14.1 oz) (02/04 0510) Last BM Date: 12/29/15  Intake/Output from previous day: 02/03 0701 - 02/04 0700 In: 1980 [P.O.:480; I.V.:1500] Out: 1625 [Urine:1350; Drains:275] Intake/Output this shift: Total I/O In: -  Out: 350 [Urine:350]  PE: Gen:  Alert, NAD, pleasant Abd: Soft, quite tender over RUQ drain, perc chole drain in RUQ with dark green bilious output (231mL/24hr)  Lab Results:   Recent Labs  12/30/15 0448 12/31/15 0521  WBC 10.0 9.1  HGB 12.1 11.6*  HCT 37.3 35.9*  PLT 177 175   BMET  Recent Labs  12/30/15 0448 12/31/15 0521  NA 140 141  K 4.0 4.1  CL 105 110  CO2 25 25  GLUCOSE 152* 92  BUN 12 11  CREATININE 1.15* 1.27*  CALCIUM 9.0 9.1   PT/INR  Recent Labs  12/29/15 1731  LABPROT 13.2  INR 0.98   CMP     Component Value Date/Time   NA 141 12/31/2015 0521   K 4.1 12/31/2015 0521   CL 110 12/31/2015 0521   CO2 25 12/31/2015 0521   GLUCOSE 92 12/31/2015 0521   BUN 11 12/31/2015 0521   CREATININE 1.27* 12/31/2015 0521   CREATININE 1.26* 07/28/2015 1021   CREATININE 1.3* 10/23/2011   CALCIUM 9.1 12/31/2015 0521   PROT 6.8 12/30/2015 0448   ALBUMIN 3.7 12/30/2015 0448   AST 19 12/30/2015 0448   ALT 14 12/30/2015 0448   ALKPHOS 55 12/30/2015 0448   BILITOT 0.7 12/30/2015 0448   GFRNONAA 40* 12/31/2015 0521   GFRNONAA 41* 07/28/2015 1021   GFRAA 46* 12/31/2015 0521   GFRAA 48* 07/28/2015 1021   Lipase     Component Value Date/Time   LIPASE 37 12/29/2015 0821        Studies/Results: Ct Abdomen Pelvis Wo Contrast  12/29/2015  CLINICAL DATA:  Abdominal pain and emesis EXAM: CT ABDOMEN AND PELVIS WITHOUT CONTRAST TECHNIQUE: Multidetector CT imaging of the abdomen and pelvis was performed following the standard protocol without IV contrast. COMPARISON:  06/29/2013 FINDINGS: Lung bases are free of acute infiltrate or sizable effusion. The liver, spleen, adrenal glands and pancreas are within normal limits. The right kidney has been surgically removed. The left kidney shows no obstructive changes. A tiny likely hyperdense cyst is noted in the midportion of the left kidney. The gallbladder is well distended demonstrates some wall thickening/ pericholecystic fluid. In the appropriate clinical setting these changes would be consistent with acute cholecystitis. No significant lymphadenopathy is noted. Aortoiliac calcifications are noted. The appendix is within normal limits. The bladder is well distended. The uterus has been surgically removed. Mild diverticular change is noted without evidence of diverticulitis. The bony structures show no acute abnormality. IMPRESSION: Changes suggestive of acute cholecystitis. Right upper quadrant ultrasound would be helpful for further evaluation. The remainder of the exam is stable from the prior study. Electronically Signed   By: Inez Catalina M.D.   On: 12/29/2015 11:47   Ir Perc Cholecystostomy  12/29/2015  INDICATION: 80 year old female with acute cholecystitis and symptoms for the past week. She is a poorly controlled diabetic and in the setting of prolonged symptoms a relatively poor surgical candidate with a high risk for conversion to open cholecystectomy. Percutaneous cholecystostomy tube placement is recommended at this time followed by interval elective laparoscopic cholecystectomy. EXAM: CHOLECYSTOSTOMY MEDICATIONS: Ciprofloxacin 400 mg IV; The antibiotic was administered within an appropriate time frame prior to the  initiation of the procedure. ANESTHESIA/SEDATION: Moderate (conscious) sedation was employed during this procedure. A total of Versed 4 mg and Fentanyl 100 mcg was administered intravenously. Moderate Sedation Time: 12 minutes. The patient's level of consciousness and vital signs were monitored continuously by radiology nursing throughout the procedure under my direct supervision. FLUOROSCOPY TIME:  Fluoroscopy Time: 1 minutes 18 seconds (66.4 mGy). COMPLICATIONS: None immediate. Estimated blood loss: None PROCEDURE: Informed written consent was obtained from the patient after a thorough discussion of the procedural risks, benefits and alternatives. All questions were addressed. Maximal Sterile Barrier Technique was utilized including caps, mask, sterile gowns, sterile gloves, sterile drape, hand hygiene and skin antiseptic. A timeout was performed prior to the initiation of the procedure. The right upper quadrant was interrogated with ultrasound. The inflamed and thickened gallbladder is successfully identified. A suitable skin entry site was selected and marked. Local anesthesia was attained by infiltration with 1% lidocaine. A small dermatotomy was made. Under real-time sonographic guidance, the gallbladder was punctured using a 21 gauge Accustick needle. The puncture was carried through a short segment of hepatic parenchyma consistent with a transhepatic approach. Gallstones are visualized within the gallbladder lumen. Dark black bile was present in the needle hub after removal of the stylet. The Accustick wire was coiled within the gallbladder lumen and the needle exchanged for the Accustick sheath. A gentle hand injection of contrast material confirmed placement within the gallbladder. The Accustick sheath was then exchanged over a short Amplatz wire and the skin tract dilated to 10 [place]. A Cook 10.2 [place] all-purpose drainage catheter was then advanced over the wire and coiled within the gallbladder lumen.  A sample of bile was aspirated and will be sent for culture. A fluoroscopic image was obtained confirming the presence of the tube within the gallbladder lumen. The tube was secured to the skin with 0 Prolene suture and connected to gravity bag drainage. IMPRESSION: Successful placement of a transhepatic percutaneous cholecystostomy tube for calculus cholecystitis. Signed, Criselda Peaches, MD [facility] Radiology Electronically Signed   By: Jacqulynn Cadet M.D.   On: 12/29/2015 17:04   Dg Abd Acute W/chest  12/29/2015  CLINICAL DATA:  Nausea, vomiting, mid abdominal pain intermittently since Saturday, blood in emesis Saturday, occasional smoker, history renal cancer post nephrectomy, diabetes mellitus, hypertension, GERD, smoker EXAM: DG ABDOMEN ACUTE W/ 1V CHEST COMPARISON:  None; correlation CT abdomen pelvis 06/29/2013, CT chest 12/20/2009 FINDINGS: Upper normal heart size with minimal pulmonary vascular congestion. Tortuous aorta. Mediastinal contours normal. Bibasilar atelectasis without infiltrate, pleural effusion, or pneumothorax. Surgical clips RIGHT abdomen from prior nephrectomy. Nonobstructive bowel gas pattern. Scattered stool throughout colon with prominent stool within a low lying cecum in pelvis. No bowel dilatation, bowel wall thickening, or free intraperitoneal air. Pelvic phleboliths and few atherosclerotic calcifications are stable. Bones demineralized. IMPRESSION: Bibasilar atelectasis. No acute abdominal findings. Electronically Signed   By: Lavonia Dana M.D.   On: 12/29/2015 09:33   US Abdomen Limited Ruq  12/29/2015  CLINICAL DATA:  Right upper quadrant pain for 1  week EXAM: US ABDOMEN LIMITED - RIGHT UPPER QUADRANT COMPARISON:  None. FINDINGS: Gallbladder: Sludge and gallstones are identified in the gallbladder. The gallbladder wall measures 3.8 mm. There is pericholecystic fluid. No sonographic Murphy sign noted by sonographer.  Common bile duct: Diameter: 5.3 mm Liver: There is diffuse increased echotexture of the liver. IMPRESSION: Sludge in gallstone identified within the gallbladder with pericholecystic fluid and gallbladder wall thickness of 3.8 mm. The sonographer reports a negative sonographic Murphy sign. The findings are equivocal for acute cholecystitis. Diffuse increased echotexture of the liver, nonspecific but can be seen in fatty infiltration of liver. Electronically Signed   By: Abelardo Diesel M.D.   On: 12/29/2015 14:53    Anti-infectives: Anti-infectives    Start     Dose/Rate Route Frequency Ordered Stop   12/29/15 1415  ciprofloxacin (CIPRO) IVPB 400 mg     400 mg 200 mL/hr over 60 Minutes Intravenous Every 12 hours 12/29/15 1405         Assessment/Plan RUQ abdominal pain Large edematous gallbladder & Cholelithiasis -Symptoms have been going on for almost a week. LFT's normal, WBC only mildly elevated. Suspect this is acute on chronic. US showed stones. Perc chole done [employer].   -Due to her co-morbidities, age, and how large the GB is she may require an open procedure. We would like to avoid this in the acute setting if possible. This is why we recommended perc chole drain and IV antibiotics. Interval cholecystectomy in 6-8 weeks. -Advance diet as tolerated to low fat foods, pain control, antiemetics, antibiotics (cipro) -SCD's and lovenox or heparin for DVT proph okay with [place] -Ambulate and IS -Will follow, ok to d/c when stable  Multiple medical problems - uncontrolled HTN/DM, CKD per medicine    LOS: 2 days    Sony Schlarb C. [id number], 0000000 AM

## 2015-12-31 NOTE — Progress Notes (Signed)
Referring Physician(s): TRH/CCS  Chief Complaint:  cholecystitis  Subjective:  Chole drain placed 2/2 Better today  Allergies: Ciprocin-fluocin-procin; Clonidine derivatives; Codeine; Crestor; Penicillins; Simvastatin; and Tessalon perles  Medications: Prior to Admission medications   Medication Sig Start Date End Date Taking? Authorizing Provider  aspirin 81 MG tablet Take 81 mg by mouth daily.   Yes Historical Provider, MD  Cholecalciferol (VITAMIN D-3 PO) Take 1 tablet by mouth daily.   Yes Historical Provider, MD  clonazePAM (KLONOPIN) 1 MG tablet Take 1/2 tab in AM and 1/2 tab in afternoon and 1 at [facility] Patient taking differently: Take 0.5-1 mg by mouth 3 (three) times daily as needed for anxiety (sleep). Take 1/2 tab in AM and 1/2 tab in afternoon and 1 at [facility] 11/29/15  Yes [name], MD  diltiazem (CARTIA XT) 240 MG 24 hr capsule Take 1 capsule (240 mg total) by mouth daily. 11/29/15  Yes [name], MD  fluticasone (FLONASE) 50 MCG/ACT nasal spray USE 2 SPRAYS IN EACH NOSTRIL EVERY DAY Patient taking differently: USE 2 SPRAYS IN EACH NOSTRIL once daily as needed for allergies 10/11/14  Yes [name], MD  furosemide (LASIX) 40 MG tablet TAKE 1 TABLET BY MOUTH EVERY DAY 11/07/15  Yes [name], MD  glipiZIDE (GLUCOTROL XL) 10 MG 24 hr tablet TAKE 1 TABLET BY MOUTH TWICE DAILY 11/29/15  Yes [name], MD  levothyroxine (SYNTHROID, LEVOTHROID) 75 MCG tablet Take 1 tablet (75 mcg total) by mouth daily before breakfast. 11/07/15  Yes [name], MD  losartan (COZAAR) 25 MG tablet Take 1 tablet daily 07/28/15  Yes [name], MD  metoprolol (LOPRESSOR) 100 MG tablet TAKE 1 TABLET BY MOUTH TWICE DAILY 11/29/15  Yes [name], MD  miconazole (MICATIN) 2 % cream Apply 1 application topically 2 (two) times daily. Patient taking differently: Apply 1 application topically 2 (two) times daily as needed (irritation).  11/09/14  Yes Shawnee Knapp, MD  mupirocin ointment  (BACTROBAN) 2 % APPLY TO THE AREAS AROUND THE BASE OF THE GREAT TOENAIL TWICE DAILY Patient taking differently: APPLY TO THE AREAS AROUND THE BASE OF THE GREAT TOENAIL twice daily as needed for breakouts on feet/toes 03/02/15  Yes [name], MD  omeprazole (PRILOSEC) 40 MG capsule Take 40 mg by mouth daily.   Yes Historical Provider, MD  rosuvastatin (CRESTOR) 5 MG tablet Take 1 tablet (5 mg total) by mouth daily. 04/28/15  Yes [name], MD  vitamin E 100 UNIT capsule Take 100 Units by mouth daily.   Yes Historical Provider, MD  metFORMIN (GLUCOPHAGE-XR) 500 MG 24 hr tablet TAKE 1 TABLET BY MOUTH A DAY FOR THE FIRST 2 WEEKS AND IF TOLERATED FROM A GI STANDPOINT INCREASE TO 2 TABLETS DAILY Patient not taking: Reported on 07/28/2015 04/28/15   [name], MD     Vital Signs: BP 157/70 mmHg  Pulse 85  Temp(Src) 100.9 F (38.3 C) (Oral)  Resp 16  Ht 5' 6.5" (1.689 m)  Wt 181 lb 14.1 oz (82.5 kg)  BMI 28.92 kg/m2  SpO2 96%  Physical Exam  Abdominal: Soft. Bowel sounds are normal. There is tenderness.  Neurological: She is alert.  Skin: Skin is warm.  Site of chole drain is sl tender No redness No bleeding Output 375 cc yesterday---bile 50 cc in bag   Nursing note and vitals reviewed.   Imaging: Ct Abdomen Pelvis Wo Contrast  12/29/2015  CLINICAL  DATA:  Abdominal pain and emesis EXAM: CT ABDOMEN AND PELVIS WITHOUT CONTRAST TECHNIQUE: Multidetector CT imaging of the abdomen and pelvis was performed following the standard protocol without IV contrast. COMPARISON:  06/29/2013 FINDINGS: Lung bases are free of acute infiltrate or sizable effusion. The liver, spleen, adrenal glands and pancreas are within normal limits. The right kidney has been surgically removed. The left kidney shows no obstructive changes. A tiny likely hyperdense cyst is noted in the midportion of the left kidney. The gallbladder is well distended demonstrates some wall thickening/ pericholecystic fluid. In the  appropriate clinical setting these changes would be consistent with acute cholecystitis. No significant lymphadenopathy is noted. Aortoiliac calcifications are noted. The appendix is within normal limits. The bladder is well distended. The uterus has been surgically removed. Mild diverticular change is noted without evidence of diverticulitis. The bony structures show no acute abnormality. IMPRESSION: Changes suggestive of acute cholecystitis. Right upper quadrant ultrasound would be helpful for further evaluation. The remainder of the exam is stable from the prior study. Electronically Signed   By: Inez Catalina M.D.   On: 12/29/2015 11:47   Ir Perc Cholecystostomy  12/29/2015  INDICATION: 80 year old female with acute cholecystitis and symptoms for the past week. She is a poorly controlled diabetic and in the setting of prolonged symptoms a relatively poor surgical candidate with a high risk for conversion to open cholecystectomy. Percutaneous cholecystostomy tube placement is recommended at this time followed by interval elective laparoscopic cholecystectomy. EXAM: CHOLECYSTOSTOMY MEDICATIONS: Ciprofloxacin 400 mg IV; The antibiotic was administered within an appropriate time frame prior to the initiation of the procedure. ANESTHESIA/SEDATION: Moderate (conscious) sedation was employed during this procedure. A total of Versed 4 mg and Fentanyl 100 mcg was administered intravenously. Moderate Sedation Time: 12 minutes. The patient's level of consciousness and vital signs were monitored continuously by radiology nursing throughout the procedure under my direct supervision. FLUOROSCOPY TIME:  Fluoroscopy Time: 1 minutes 18 seconds (66.4 mGy). COMPLICATIONS: None immediate. Estimated blood loss: None PROCEDURE: Informed written consent was obtained from the patient after a thorough discussion of the procedural risks, benefits and alternatives. All questions were addressed. Maximal Sterile Barrier Technique was  utilized including caps, mask, sterile gowns, sterile gloves, sterile drape, hand hygiene and skin antiseptic. A timeout was performed prior to the initiation of the procedure. The right upper quadrant was interrogated with ultrasound. The inflamed and thickened gallbladder is successfully identified. A suitable skin entry site was selected and marked. Local anesthesia was attained by infiltration with 1% lidocaine. A small dermatotomy was made. Under real-time sonographic guidance, the gallbladder was punctured using a 21 gauge Accustick needle. The puncture was carried through a short segment of hepatic parenchyma consistent with a transhepatic approach. Gallstones are visualized within the gallbladder lumen. Dark black bile was present in the needle hub after removal of the stylet. The Accustick wire was coiled within the gallbladder lumen and the needle exchanged for the Accustick sheath. A gentle hand injection of contrast material confirmed placement within the gallbladder. The Accustick sheath was then exchanged over a short Amplatz wire and the skin tract dilated to 10 [place]. A Cook 10.2 [place] all-purpose drainage catheter was then advanced over the wire and coiled within the gallbladder lumen. A sample of bile was aspirated and will be sent for culture. A fluoroscopic image was obtained confirming the presence of the tube within the gallbladder lumen. The tube was secured to the skin with 0 Prolene suture and connected to gravity bag drainage.  IMPRESSION: Successful placement of a transhepatic percutaneous cholecystostomy tube for calculus cholecystitis. Signed, Criselda Peaches, MD [facility] Electronically Signed   By: Jacqulynn Cadet M.D.   On: 12/29/2015 17:04   Dg Abd Acute W/chest  12/29/2015  CLINICAL DATA:  Nausea, vomiting, mid abdominal pain intermittently since Saturday, blood in emesis Saturday, occasional smoker, history renal  cancer post nephrectomy, diabetes mellitus, hypertension, GERD, smoker EXAM: DG ABDOMEN ACUTE W/ 1V CHEST COMPARISON:  None; correlation CT abdomen pelvis 06/29/2013, CT chest 12/20/2009 FINDINGS: Upper normal heart size with minimal pulmonary vascular congestion. Tortuous aorta. Mediastinal contours normal. Bibasilar atelectasis without infiltrate, pleural effusion, or pneumothorax. Surgical clips RIGHT abdomen from prior nephrectomy. Nonobstructive bowel gas pattern. Scattered stool throughout colon with prominent stool within a low lying cecum in pelvis. No bowel dilatation, bowel wall thickening, or free intraperitoneal air. Pelvic phleboliths and few atherosclerotic calcifications are stable. Bones demineralized. IMPRESSION: Bibasilar atelectasis. No acute abdominal findings. Electronically Signed   By: Lavonia Dana M.D.   On: 12/29/2015 09:33   US Abdomen Limited Ruq  12/29/2015  CLINICAL DATA:  Right upper quadrant pain for 1 week EXAM: US ABDOMEN LIMITED - RIGHT UPPER QUADRANT COMPARISON:  None. FINDINGS: Gallbladder: Sludge and gallstones are identified in the gallbladder. The gallbladder wall measures 3.8 mm. There is pericholecystic fluid. No sonographic Murphy sign noted by sonographer. Common bile duct: Diameter: 5.3 mm Liver: There is diffuse increased echotexture of the liver. IMPRESSION: Sludge in gallstone identified within the gallbladder with pericholecystic fluid and gallbladder wall thickness of 3.8 mm. The sonographer reports a negative sonographic Murphy sign. The findings are equivocal for acute cholecystitis. Diffuse increased echotexture of the liver, nonspecific but can be seen in fatty infiltration of liver. Electronically Signed   By: Abelardo Diesel M.D.   On: 12/29/2015 14:53    Labs:  CBC:  Recent Labs  07/28/15 1021 10/21/15 1318 12/29/15 0821 12/30/15 0448 12/31/15 0521  WBC 7.4 6.1 10.7* 10.0 9.1  HGB 13.2 13.3 12.4 12.1 11.6*  HCT 39.4 40.6 37.4 37.3 35.9*  PLT 207   --  199 177 175    COAGS:  Recent Labs  12/29/15 1731  INR 0.98  APTT 25    BMP:  Recent Labs  07/28/15 1021 12/29/15 0821 12/30/15 0448 12/31/15 0521  NA 140 140 140 141  K 4.3 4.4 4.0 4.1  CL 101 103 105 110  CO2 29 28 25 25   GLUCOSE 178* 209* 152* 92  BUN 18 16 12 11   CALCIUM 9.5 9.3 9.0 9.1  CREATININE 1.26* 1.39* 1.15* 1.27*  GFRNONAA 41* 36* 45* 40*  GFRAA 48* 41* 52* 46*    LIVER FUNCTION TESTS:  Recent Labs  04/27/15 1044 12/29/15 0821 12/30/15 0448  BILITOT 0.7 0.7 0.7  AST 16 18 19   ALT 16 16 14   ALKPHOS 71 70 55  PROT 7.0 7.9 6.8  ALBUMIN 4.0 4.3 3.7    Assessment and Plan:  Chole drain placed 2/2 To remain in place 6-8 weeks  Plan per CCS  Electronically Signed: Jakyrah Holladay A 12/31/2015, 10:50 AM   I spent a total of 15 Minutes at the the patient's bedside AND on the patient's hospital floor or unit, greater than 50% of which was counseling/coordinating care for chole drain

## 2015-12-31 NOTE — Progress Notes (Signed)
PROGRESS NOTE  Sherri Burns [id number] DOB: 09-21-1936 DOA: 12/29/2015  PCP: Jenny Reichmann, MD  Brief History 80 yo female with history of NIDDM, HTN, CKD 3, R-nephrectomy secondary to renal cancer presented with 5 day history of nausea, vomiting,right upper quadrant and epigastricabdominal pain. This started on 12/24/2015 after eating a greasy meal.the pain improved over the next several days as the patient ate lighter meals. However, the patient's pain returned  On 12/28/2015 after eating chicken. She continued to have nausea and vomiting.  She stated that she also had some blood-tinged emesis. She has subjective fevers and chills. She denied any chest discomfort, shortness breath, hematochezia, melena.In the emergency department, workup revealed cholecystitis. General surgery was consulted,and they recommended a percutaneous drain which was placed by interventional radiology 12/29/2015.  Assessment/Plan: Acute cholecystitis -12/29/2015 CT abdomen and pelvis--GB thickening anddistended with pericholecystic fluid -12/29/2015 RUQ US--thickened gallbladderith sludge and pericholecystic fluid -12/29/2015 percutaneous drain -contineu Cipro and if still with low grade fevers in next 24 hours will add metronidazole or broadened to Zosyn -Follow biliary culture, pre lim report with g- rods -Continue intravenous fluids -Pain control -Advance diet per general surgery CKD stage III -baseline creatinine 1.1-1.4, repeat BMP in AM Diabetes mellitus type 2 -11/29/2015 hemoglobin A1c 6.9 -Patient no longer takes metformin -Hold glipizide until oral intake improves  -NovoLog sliding scale Hypertension -Continue diltiazem CD, losartan, metoprolol tartrate -hold furosemide and reassess if it can be started in AM Anxiety -Continue home dose clonazepam prn Hypothyroidism -Continue Synthroid -TSH 0.550 Hyperlipidemia -Continue statin  Family Communication:   Pt at  beside Disposition Plan:   Home by 2/6  Procedures/Studies: Ct Abdomen Pelvis Wo Contrast  12/29/2015  CLINICAL DATA:  Abdominal pain and emesis EXAM: CT ABDOMEN AND PELVIS WITHOUT CONTRAST TECHNIQUE: Multidetector CT imaging of the abdomen and pelvis was performed following the standard protocol without IV contrast. COMPARISON:  06/29/2013 FINDINGS: Lung bases are free of acute infiltrate or sizable effusion. The liver, spleen, adrenal glands and pancreas are within normal limits. The right kidney has been surgically removed. The left kidney shows no obstructive changes. A tiny likely hyperdense cyst is noted in the midportion of the left kidney. The gallbladder is well distended demonstrates some wall thickening/ pericholecystic fluid. In the appropriate clinical setting these changes would be consistent with acute cholecystitis. No significant lymphadenopathy is noted. Aortoiliac calcifications are noted. The appendix is within normal limits. The bladder is well distended. The uterus has been surgically removed. Mild diverticular change is noted without evidence of diverticulitis. The bony structures show no acute abnormality. IMPRESSION: Changes suggestive of acute cholecystitis. Right upper quadrant ultrasound would be helpful for further evaluation. The remainder of the exam is stable from the prior study. Electronically Signed   By: Inez Catalina M.D.   On: 12/29/2015 11:47   Ir Perc Cholecystostomy  12/29/2015  INDICATION: 80 year old female with acute cholecystitis and symptoms for the past week. She is a poorly controlled diabetic and in the setting of prolonged symptoms a relatively poor surgical candidate with a high risk for conversion to open cholecystectomy. Percutaneous cholecystostomy tube placement is recommended at this time followed by interval elective laparoscopic cholecystectomy. EXAM: CHOLECYSTOSTOMY MEDICATIONS: Ciprofloxacin 400 mg IV; The antibiotic was administered within an  appropriate time frame prior to the initiation of the procedure. ANESTHESIA/SEDATION: Moderate (conscious) sedation was employed during this procedure. A total of Versed 4 mg and Fentanyl 100 mcg was administered intravenously. Moderate  Sedation Time: 12 minutes. The patient's level of consciousness and vital signs were monitored continuously by radiology nursing throughout the procedure under my direct supervision. FLUOROSCOPY TIME:  Fluoroscopy Time: 1 minutes 18 seconds (66.4 mGy). COMPLICATIONS: None immediate. Estimated blood loss: None PROCEDURE: Informed written consent was obtained from the patient after a thorough discussion of the procedural risks, benefits and alternatives. All questions were addressed. Maximal Sterile Barrier Technique was utilized including caps, mask, sterile gowns, sterile gloves, sterile drape, hand hygiene and skin antiseptic. A timeout was performed prior to the initiation of the procedure. The right upper quadrant was interrogated with ultrasound. The inflamed and thickened gallbladder is successfully identified. A suitable skin entry site was selected and marked. Local anesthesia was attained by infiltration with 1% lidocaine. A small dermatotomy was made. Under real-time sonographic guidance, the gallbladder was punctured using a 21 gauge Accustick needle. The puncture was carried through a short segment of hepatic parenchyma consistent with a transhepatic approach. Gallstones are visualized within the gallbladder lumen. Dark black bile was present in the needle hub after removal of the stylet. The Accustick wire was coiled within the gallbladder lumen and the needle exchanged for the Accustick sheath. A gentle hand injection of contrast material confirmed placement within the gallbladder. The Accustick sheath was then exchanged over a short Amplatz wire and the skin tract dilated to 10 [place]. A Cook 10.2 [place] all-purpose drainage catheter was then advanced over the wire and  coiled within the gallbladder lumen. A sample of bile was aspirated and will be sent for culture. A fluoroscopic image was obtained confirming the presence of the tube within the gallbladder lumen. The tube was secured to the skin with 0 Prolene suture and connected to gravity bag drainage. IMPRESSION: Successful placement of a transhepatic percutaneous cholecystostomy tube for calculus cholecystitis. Signed, Criselda Peaches, MD [facility] Electronically Signed   By: Jacqulynn Cadet M.D.   On: 12/29/2015 17:04   Dg Abd Acute W/chest  12/29/2015  CLINICAL DATA:  Nausea, vomiting, mid abdominal pain intermittently since Saturday, blood in emesis Saturday, occasional smoker, history renal cancer post nephrectomy, diabetes mellitus, hypertension, GERD, smoker EXAM: DG ABDOMEN ACUTE W/ 1V CHEST COMPARISON:  None; correlation CT abdomen pelvis 06/29/2013, CT chest 12/20/2009 FINDINGS: Upper normal heart size with minimal pulmonary vascular congestion. Tortuous aorta. Mediastinal contours normal. Bibasilar atelectasis without infiltrate, pleural effusion, or pneumothorax. Surgical clips RIGHT abdomen from prior nephrectomy. Nonobstructive bowel gas pattern. Scattered stool throughout colon with prominent stool within a low lying cecum in pelvis. No bowel dilatation, bowel wall thickening, or free intraperitoneal air. Pelvic phleboliths and few atherosclerotic calcifications are stable. Bones demineralized. IMPRESSION: Bibasilar atelectasis. No acute abdominal findings. Electronically Signed   By: Lavonia Dana M.D.   On: 12/29/2015 09:33   US Abdomen Limited Ruq  12/29/2015  CLINICAL DATA:  Right upper quadrant pain for 1 week EXAM: US ABDOMEN LIMITED - RIGHT UPPER QUADRANT COMPARISON:  None. FINDINGS: Gallbladder: Sludge and gallstones are identified in the gallbladder. The gallbladder wall measures 3.8 mm. There is pericholecystic fluid. No sonographic  Murphy sign noted by sonographer. Common bile duct: Diameter: 5.3 mm Liver: There is diffuse increased echotexture of the liver. IMPRESSION: Sludge in gallstone identified within the gallbladder with pericholecystic fluid and gallbladder wall thickness of 3.8 mm. The sonographer reports a negative sonographic Murphy sign. The findings are equivocal for acute cholecystitis. Diffuse increased echotexture of the liver, nonspecific but can be seen in fatty  infiltration of liver. Electronically Signed   By: Abelardo Diesel M.D.   On: 12/29/2015 14:53    Subjective: Patient states the pain is controlled. Denies any fevers, chills.  Objective: Filed Vitals:   12/30/15 2136 12/31/15 0500 12/31/15 0510 12/31/15 1320  BP: 154/68  157/70 161/86  Pulse: 93  85 73  Temp: 100.5 F (38.1 C)  100.9 F (38.3 C) 99 F (37.2 C)  TempSrc: Oral  Oral Oral  Resp: 16  16 16   Height:      Weight:  82.7 kg (182 lb 5.1 oz) 82.5 kg (181 lb 14.1 oz)   SpO2: 92%  96% 100%    Intake/Output Summary (Last 24 hours) at 12/31/15 1650 Last data filed at 12/31/15 1442  Gross per 24 hour  Intake 2604.17 ml  Output   2135 ml  Net 469.17 ml   Weight change: 3.9 kg (8 lb 9.6 oz) Exam:   General:  Pt is alert, follows commands appropriately, not in acute distress  Cardiovascular: RRR, S1/S2, no rubs, no gallops  Respiratory: ffine bibasilar crackles without wheezing. Good movement.  Abdomen: Soft/+BS, non tender, non distended, no guarding  Extremities: No edema, No lymphangitis, No petechiae, No rashes, no synovitis  Data Reviewed: Basic Metabolic Panel:  Recent Labs Lab 12/29/15 0821 12/30/15 0448 12/31/15 0521  NA 140 140 141  K 4.4 4.0 4.1  CL 103 105 110  CO2 28 25 25   GLUCOSE 209* 152* 92  BUN 16 12 11   CREATININE 1.39* 1.15* 1.27*  CALCIUM 9.3 9.0 9.1   Liver Function Tests:  Recent Labs Lab 12/29/15 0821 12/30/15 0448  AST 18 19  ALT 16 14  ALKPHOS 70 55  BILITOT 0.7 0.7  PROT 7.9  6.8  ALBUMIN 4.3 3.7    Recent Labs Lab 12/29/15 0821  LIPASE 37   No results for input(s): AMMONIA in the last 168 hours. CBC:  Recent Labs Lab 12/29/15 0821 12/30/15 0448 12/31/15 0521  WBC 10.7* 10.0 9.1  NEUTROABS 8.9*  --   --   HGB 12.4 12.1 11.6*  HCT 37.4 37.3 35.9*  MCV 95.7 97.9 98.1  PLT 199 177 175   CBG:  Recent Labs Lab 12/30/15 2343 12/31/15 0340 12/31/15 0808 12/31/15 1154 12/31/15 1602  GLUCAP 71 104* 84 117* 137*    Recent Results (from the past 240 hour(s))  Culture, body fluid-bottle     Status: None (Preliminary result)   Collection Time: 12/29/15  5:07 PM  Result Value Ref Range Status   Specimen Description BILE  Final   Special Requests BOTTLES DRAWN AEROBIC AND ANAEROBIC 5CC  Final   Gram Stain   Final    GRAM VARIABLE ROD IN BOTH AEROBIC AND ANAEROBIC BOTTLES CRITICAL RESULT CALLED TO, READ BACK BY AND VERIFIED WITH: A MATHIS,RN @0405  12/30/15 MKELLY    Culture   Final    GRAM NEGATIVE RODS Performed at [facility]    Report Status PENDING  Incomplete  Gram stain     Status: None   Collection Time: 12/29/15  5:07 PM  Result Value Ref Range Status   Specimen Description BILE  Final   Special Requests NONE  Final   Gram Stain   Final    RARE WBC PRESENT,BOTH PMN AND MONONUCLEAR ABUNDANT GRAM NEGATIVE RODS Gram Stain Report Called to,Read Back By and Verified With: M MATHIS RN [id number] 12/29/15 A BROWNING Performed at [facility]    Report Status 12/29/2015 FINAL  Final  Scheduled Meds: . aspirin EC  81 mg Oral Daily  . ciprofloxacin  400 mg Intravenous Q12H  . diltiazem  240 mg Oral Daily  . enoxaparin (LOVENOX) injection  40 mg Subcutaneous Q24H  . hydrocerin   Topical BID  . insulin aspart  0-15 Units Subcutaneous 6 times per day  . levothyroxine  75 mcg Oral QAC breakfast  . losartan  25 mg Oral Daily  . metoprolol  100 mg Oral BID  . [START ON 01/01/2016] pantoprazole  80 mg Oral QAC breakfast  .  polyethylene glycol  17 g Oral Daily  . rosuvastatin  5 mg Oral Daily   Continuous Infusions: . sodium chloride 125 mL/hr at 12/31/15 1441     MAGICK-Shaka Zech, DO  [facility] Pager [phone]  If 7PM-7AM, please contact night-coverage [email or web address] Password [email or web address] 12/31/2015, 4:50 PM   LOS: 2 days

## 2016-01-01 LAB — CULTURE, BODY FLUID-BOTTLE

## 2016-01-01 LAB — GLUCOSE, CAPILLARY
GLUCOSE-CAPILLARY: 101 mg/dL — AB (ref 65–99)
GLUCOSE-CAPILLARY: 161 mg/dL — AB (ref 65–99)
Glucose-Capillary: 114 mg/dL — ABNORMAL HIGH (ref 65–99)
Glucose-Capillary: 131 mg/dL — ABNORMAL HIGH (ref 65–99)
Glucose-Capillary: 81 mg/dL (ref 65–99)

## 2016-01-01 LAB — CBC
HEMATOCRIT: 34.3 % — AB (ref 36.0–46.0)
Hemoglobin: 11 g/dL — ABNORMAL LOW (ref 12.0–15.0)
MCH: 31.5 pg (ref 26.0–34.0)
MCHC: 32.1 g/dL (ref 30.0–36.0)
MCV: 98.3 fL (ref 78.0–100.0)
Platelets: 183 10*3/uL (ref 150–400)
RBC: 3.49 MIL/uL — AB (ref 3.87–5.11)
RDW: 14.4 % (ref 11.5–15.5)
WBC: 9 10*3/uL (ref 4.0–10.5)

## 2016-01-01 LAB — COMPREHENSIVE METABOLIC PANEL
ALT: 11 U/L — AB (ref 14–54)
AST: 12 U/L — AB (ref 15–41)
Albumin: 3.2 g/dL — ABNORMAL LOW (ref 3.5–5.0)
Alkaline Phosphatase: 50 U/L (ref 38–126)
Anion gap: 7 (ref 5–15)
BUN: 9 mg/dL (ref 6–20)
CHLORIDE: 108 mmol/L (ref 101–111)
CO2: 24 mmol/L (ref 22–32)
CREATININE: 1.11 mg/dL — AB (ref 0.44–1.00)
Calcium: 8.9 mg/dL (ref 8.9–10.3)
GFR, EST AFRICAN AMERICAN: 54 mL/min — AB (ref >60)
GFR, EST NON AFRICAN AMERICAN: 47 mL/min — AB (ref >60)
Glucose, Bld: 123 mg/dL — ABNORMAL HIGH (ref 65–99)
POTASSIUM: 3.8 mmol/L (ref 3.5–5.1)
SODIUM: 139 mmol/L (ref 135–145)
Total Bilirubin: 0.7 mg/dL (ref 0.3–1.2)
Total Protein: 6.7 g/dL (ref 6.5–8.1)

## 2016-01-01 LAB — CULTURE, BODY FLUID W GRAM STAIN -BOTTLE

## 2016-01-01 MED ORDER — BISACODYL 10 MG RE SUPP
10.0000 mg | Freq: Once | RECTAL | Status: AC
  Administered 2016-01-01: 10 mg via RECTAL
  Filled 2016-01-01: qty 1

## 2016-01-01 NOTE — Progress Notes (Signed)
[facility] Progress Note     Subjective: Pain improved.  Sore over drain site.  C/O constipation    Objective: Vital signs in last 24 hours: Temp:  [98.2 F (36.8 C)-99.1 F (37.3 C)] 99.1 F (37.3 C) (02/05 0556) Pulse Rate:  [73-74] 74 (02/05 0556) Resp:  [16-17] 16 (02/05 0556) BP: (161-171)/(79-86) 171/85 mmHg (02/05 0556) SpO2:  [94 %-100 %] 96 % (02/05 0556) Weight:  [81 kg (178 lb 9.2 oz)] 81 kg (178 lb 9.2 oz) (02/05 0549) Last BM Date: 12/27/15  Intake/Output from previous day: 02/04 0701 - 02/05 0700 In: 2675 [P.O.:120; I.V.:2350; IV Piggyback:200] Out: 2460 [Urine:2300; Drains:160] Intake/Output this shift:    PE: Gen:  Alert, NAD, pleasant Abd: Soft, quite tender over RUQ drain, perc chole drain in RUQ with dark green bilious output (246mL/24hr)  Lab Results:   Recent Labs  12/31/15 0521 01/01/16 0524  WBC 9.1 9.0  HGB 11.6* 11.0*  HCT 35.9* 34.3*  PLT 175 183   BMET  Recent Labs  12/31/15 0521 01/01/16 0524  NA 141 139  K 4.1 3.8  CL 110 108  CO2 25 24  GLUCOSE 92 123*  BUN 11 9  CREATININE 1.27* 1.11*  CALCIUM 9.1 8.9   PT/INR  Recent Labs  12/29/15 1731  LABPROT 13.2  INR 0.98   CMP     Component Value Date/Time   NA 139 01/01/2016 0524   K 3.8 01/01/2016 0524   CL 108 01/01/2016 0524   CO2 24 01/01/2016 0524   GLUCOSE 123* 01/01/2016 0524   BUN 9 01/01/2016 0524   CREATININE 1.11* 01/01/2016 0524   CREATININE 1.26* 07/28/2015 1021   CREATININE 1.3* 10/23/2011   CALCIUM 8.9 01/01/2016 0524   PROT 6.7 01/01/2016 0524   ALBUMIN 3.2* 01/01/2016 0524   AST 12* 01/01/2016 0524   ALT 11* 01/01/2016 0524   ALKPHOS 50 01/01/2016 0524   BILITOT 0.7 01/01/2016 0524   GFRNONAA 47* 01/01/2016 0524   GFRNONAA 41* 07/28/2015 1021   GFRAA 54* 01/01/2016 0524   GFRAA 48* 07/28/2015 1021   Lipase     Component Value Date/Time   LIPASE 37 12/29/2015 0821       Studies/Results: No results  found.  Anti-infectives: Anti-infectives    Start     Dose/Rate Route Frequency Ordered Stop   12/29/15 1415  ciprofloxacin (CIPRO) IVPB 400 mg     400 mg 200 mL/hr over 60 Minutes Intravenous Every 12 hours 12/29/15 1405         Assessment/Plan RUQ abdominal pain Large edematous gallbladder & Cholelithiasis -On presentation, symptoms had been going on for almost a week. LFT's normal, WBC only mildly elevated. Suspect this is acute on chronic. US showed stones. Perc chole done [employer].   -Due to her co-morbidities, age, and how large the GB is she may require an open procedure. We would like to avoid this in the acute setting if possible. This is why we recommended perc chole drain and IV antibiotics. Interval cholecystectomy with Dr Marlou Starks in 6-8 weeks. -Advance diet as tolerated to low fat foods, pain control, antiemetics, antibiotics (cipro) -Pt c/o constipation- miralax restarted yesterday.  Will give her a suppository today -SCD's and lovenox or heparin for DVT proph okay with [place] -Ambulate and IS -Will follow, ok to d/c when stable  Multiple medical problems - uncontrolled HTN/DM, CKD per medicine    LOS: 3 days    Sonnet Rizor C. [id number], [id number] AM

## 2016-01-01 NOTE — Progress Notes (Addendum)
PROGRESS NOTE  Sherri Burns [id number] DOB: 1936-04-16 DOA: 12/29/2015  PCP: Jenny Reichmann, MD  Brief History 80 yo female with history of NIDDM, HTN, CKD 3, R-nephrectomy secondary to renal cancer presented with 5 day history of nausea, vomiting,right upper quadrant and epigastricabdominal pain. This started on 12/24/2015 after eating a greasy meal.the pain improved over the next several days as the patient ate lighter meals. However, the patient's pain returned  On 12/28/2015 after eating chicken. She continued to have nausea and vomiting.  She stated that she also had some blood-tinged emesis. She has subjective fevers and chills. She denied any chest discomfort, shortness breath, hematochezia, melena.In the emergency department, workup revealed cholecystitis. General surgery was consulted,and they recommended a percutaneous drain which was placed by interventional radiology 12/29/2015.  Assessment/Plan: Acute cholecystitis -12/29/2015 CT abdomen and pelvis--GB thickening anddistended with pericholecystic fluid -12/29/2015 RUQ US--thickened gallbladderith sludge and pericholecystic fluid -12/29/2015 percutaneous drain -contineu Cipro as fluid culture with Klebsiella and sensitive to Cipro  -Continue intravenous fluids -Pain control -Advance diet per general surgery, ambulate  CKD stage III -baseline creatinine 1.1-1.4, repeat BMP in AM -Cr is trending down overall  Diabetes mellitus type 2 -11/29/2015 hemoglobin A1c 6.9 -Patient no longer takes metformin -Hold glipizide until oral intake improves  -NovoLog sliding scale Hypertension -Continue diltiazem CD, losartan, metoprolol tartrate -hold furosemide and resume in AM Anxiety -Continue home dose clonazepam prn Hypothyroidism -Continue Synthroid -TSH 0.550 Hyperlipidemia -Continue statin  Family Communication:   Pt at bedside Disposition Plan:   Home by 2/6  Procedures/Studies: Ct Abdomen Pelvis Wo  Contrast  12/29/2015  CLINICAL DATA:  Abdominal pain and emesis EXAM: CT ABDOMEN AND PELVIS WITHOUT CONTRAST TECHNIQUE: Multidetector CT imaging of the abdomen and pelvis was performed following the standard protocol without IV contrast. COMPARISON:  06/29/2013 FINDINGS: Lung bases are free of acute infiltrate or sizable effusion. The liver, spleen, adrenal glands and pancreas are within normal limits. The right kidney has been surgically removed. The left kidney shows no obstructive changes. A tiny likely hyperdense cyst is noted in the midportion of the left kidney. The gallbladder is well distended demonstrates some wall thickening/ pericholecystic fluid. In the appropriate clinical setting these changes would be consistent with acute cholecystitis. No significant lymphadenopathy is noted. Aortoiliac calcifications are noted. The appendix is within normal limits. The bladder is well distended. The uterus has been surgically removed. Mild diverticular change is noted without evidence of diverticulitis. The bony structures show no acute abnormality. IMPRESSION: Changes suggestive of acute cholecystitis. Right upper quadrant ultrasound would be helpful for further evaluation. The remainder of the exam is stable from the prior study. Electronically Signed   By: Inez Catalina M.D.   On: 12/29/2015 11:47   Ir Perc Cholecystostomy  12/29/2015  INDICATION: 80 year old female with acute cholecystitis and symptoms for the past week. She is a poorly controlled diabetic and in the setting of prolonged symptoms a relatively poor surgical candidate with a high risk for conversion to open cholecystectomy. Percutaneous cholecystostomy tube placement is recommended at this time followed by interval elective laparoscopic cholecystectomy. EXAM: CHOLECYSTOSTOMY MEDICATIONS: Ciprofloxacin 400 mg IV; The antibiotic was administered within an appropriate time frame prior to the initiation of the procedure. ANESTHESIA/SEDATION:  Moderate (conscious) sedation was employed during this procedure. A total of Versed 4 mg and Fentanyl 100 mcg was administered intravenously. Moderate Sedation Time: 12 minutes. The patient's level of consciousness and vital signs were monitored  continuously by radiology nursing throughout the procedure under my direct supervision. FLUOROSCOPY TIME:  Fluoroscopy Time: 1 minutes 18 seconds (66.4 mGy). COMPLICATIONS: None immediate. Estimated blood loss: None PROCEDURE: Informed written consent was obtained from the patient after a thorough discussion of the procedural risks, benefits and alternatives. All questions were addressed. Maximal Sterile Barrier Technique was utilized including caps, mask, sterile gowns, sterile gloves, sterile drape, hand hygiene and skin antiseptic. A timeout was performed prior to the initiation of the procedure. The right upper quadrant was interrogated with ultrasound. The inflamed and thickened gallbladder is successfully identified. A suitable skin entry site was selected and marked. Local anesthesia was attained by infiltration with 1% lidocaine. A small dermatotomy was made. Under real-time sonographic guidance, the gallbladder was punctured using a 21 gauge Accustick needle. The puncture was carried through a short segment of hepatic parenchyma consistent with a transhepatic approach. Gallstones are visualized within the gallbladder lumen. Dark black bile was present in the needle hub after removal of the stylet. The Accustick wire was coiled within the gallbladder lumen and the needle exchanged for the Accustick sheath. A gentle hand injection of contrast material confirmed placement within the gallbladder. The Accustick sheath was then exchanged over a short Amplatz wire and the skin tract dilated to 10 [place]. A Cook 10.2 [place] all-purpose drainage catheter was then advanced over the wire and coiled within the gallbladder lumen. A sample of bile was aspirated and will be sent  for culture. A fluoroscopic image was obtained confirming the presence of the tube within the gallbladder lumen. The tube was secured to the skin with 0 Prolene suture and connected to gravity bag drainage. IMPRESSION: Successful placement of a transhepatic percutaneous cholecystostomy tube for calculus cholecystitis. Signed, Criselda Peaches, MD [facility] Electronically Signed   By: Jacqulynn Cadet M.D.   On: 12/29/2015 17:04   Dg Abd Acute W/chest  12/29/2015  CLINICAL DATA:  Nausea, vomiting, mid abdominal pain intermittently since Saturday, blood in emesis Saturday, occasional smoker, history renal cancer post nephrectomy, diabetes mellitus, hypertension, GERD, smoker EXAM: DG ABDOMEN ACUTE W/ 1V CHEST COMPARISON:  None; correlation CT abdomen pelvis 06/29/2013, CT chest 12/20/2009 FINDINGS: Upper normal heart size with minimal pulmonary vascular congestion. Tortuous aorta. Mediastinal contours normal. Bibasilar atelectasis without infiltrate, pleural effusion, or pneumothorax. Surgical clips RIGHT abdomen from prior nephrectomy. Nonobstructive bowel gas pattern. Scattered stool throughout colon with prominent stool within a low lying cecum in pelvis. No bowel dilatation, bowel wall thickening, or free intraperitoneal air. Pelvic phleboliths and few atherosclerotic calcifications are stable. Bones demineralized. IMPRESSION: Bibasilar atelectasis. No acute abdominal findings. Electronically Signed   By: Lavonia Dana M.D.   On: 12/29/2015 09:33   US Abdomen Limited Ruq  12/29/2015  CLINICAL DATA:  Right upper quadrant pain for 1 week EXAM: US ABDOMEN LIMITED - RIGHT UPPER QUADRANT COMPARISON:  None. FINDINGS: Gallbladder: Sludge and gallstones are identified in the gallbladder. The gallbladder wall measures 3.8 mm. There is pericholecystic fluid. No sonographic Murphy sign noted by sonographer. Common bile duct: Diameter: 5.3 mm Liver: There is  diffuse increased echotexture of the liver. IMPRESSION: Sludge in gallstone identified within the gallbladder with pericholecystic fluid and gallbladder wall thickness of 3.8 mm. The sonographer reports a negative sonographic Murphy sign. The findings are equivocal for acute cholecystitis. Diffuse increased echotexture of the liver, nonspecific but can be seen in fatty infiltration of liver. Electronically Signed   By: Mallie Darting.D.  On: 12/29/2015 14:53    Subjective: Patient states the pain is controlled. Denies any fevers, chills.  Objective: Filed Vitals:   12/31/15 1320 12/31/15 2106 01/01/16 0549 01/01/16 0556  BP: 161/86 164/79  171/85  Pulse: 73 73  74  Temp: 99 F (37.2 C) 98.2 F (36.8 C)  99.1 F (37.3 C)  TempSrc: Oral Oral  Oral  Resp: 16 17  16   Height:      Weight:   81 kg (178 lb 9.2 oz)   SpO2: 100% 94%  96%    Intake/Output Summary (Last 24 hours) at 01/01/16 1401 Last data filed at 01/01/16 0600  Gross per 24 hour  Intake 1570.83 ml  Output   1510 ml  Net  60.83 ml   Weight change: -1.7 kg (-3 lb 12 oz) Exam:   General:  Pt is alert, follows commands appropriately, not in acute distress  Cardiovascular: RRR, S1/S2, no rubs, no gallops  Respiratory: ffine bibasilar crackles without wheezing. Good movement.  Abdomen: Soft/+BS, non tender, non distended, no guarding  Extremities: No edema, No lymphangitis, No petechiae, No rashes, no synovitis  Data Reviewed: Basic Metabolic Panel:  Recent Labs Lab 12/29/15 0821 12/30/15 0448 12/31/15 0521 01/01/16 0524  NA 140 140 141 139  K 4.4 4.0 4.1 3.8  CL 103 105 110 108  CO2 28 25 25 24   GLUCOSE 209* 152* 92 123*  BUN 16 12 11 9   CREATININE 1.39* 1.15* 1.27* 1.11*  CALCIUM 9.3 9.0 9.1 8.9   Liver Function Tests:  Recent Labs Lab 12/29/15 0821 12/30/15 0448 01/01/16 0524  AST 18 19 12*  ALT 16 14 11*  ALKPHOS 70 55 50  BILITOT 0.7 0.7 0.7  PROT 7.9 6.8 6.7  ALBUMIN 4.3 3.7 3.2*     Recent Labs Lab 12/29/15 0821  LIPASE 37   No results for input(s): AMMONIA in the last 168 hours. CBC:  Recent Labs Lab 12/29/15 0821 12/30/15 0448 12/31/15 0521 01/01/16 0524  WBC 10.7* 10.0 9.1 9.0  NEUTROABS 8.9*  --   --   --   HGB 12.4 12.1 11.6* 11.0*  HCT 37.4 37.3 35.9* 34.3*  MCV 95.7 97.9 98.1 98.3  PLT 199 177 175 183   CBG:  Recent Labs Lab 12/31/15 1957 12/31/15 2351 01/01/16 0419 01/01/16 0750 01/01/16 1135  GLUCAP 104* 82 114* 131* 101*    Recent Results (from the past 240 hour(s))  Culture, body fluid-bottle     Status: None   Collection Time: 12/29/15  5:07 PM  Result Value Ref Range Status   Specimen Description BILE  Final   Special Requests BOTTLES DRAWN AEROBIC AND ANAEROBIC 5CC  Final   Gram Stain   Final    GRAM VARIABLE ROD IN BOTH AEROBIC AND ANAEROBIC BOTTLES CRITICAL RESULT CALLED TO, READ BACK BY AND VERIFIED WITH: A MATHIS,RN @0405  12/30/15 MKELLY    Culture   Final    KLEBSIELLA PNEUMONIAE Performed at [facility]    Report Status 01/01/2016 FINAL  Final   Organism ID, Bacteria KLEBSIELLA PNEUMONIAE  Final      Susceptibility   Klebsiella pneumoniae - MIC*    AMPICILLIN >=32 RESISTANT Resistant     CEFAZOLIN <=4 SENSITIVE Sensitive     CEFEPIME <=1 SENSITIVE Sensitive     CEFTAZIDIME <=1 SENSITIVE Sensitive     CEFTRIAXONE <=1 SENSITIVE Sensitive     CIPROFLOXACIN <=0.25 SENSITIVE Sensitive     GENTAMICIN <=1 SENSITIVE Sensitive  IMIPENEM <=0.25 SENSITIVE Sensitive     TRIMETH/SULFA <=20 SENSITIVE Sensitive     AMPICILLIN/SULBACTAM 16 INTERMEDIATE Intermediate     PIP/TAZO 16 SENSITIVE Sensitive     * KLEBSIELLA PNEUMONIAE  Gram stain     Status: None   Collection Time: 12/29/15  5:07 PM  Result Value Ref Range Status   Specimen Description BILE  Final   Special Requests NONE  Final   Gram Stain   Final    RARE WBC PRESENT,BOTH PMN AND MONONUCLEAR ABUNDANT GRAM NEGATIVE RODS Gram Stain Report  Called to,Read Back By and Verified With: M MATHIS RN [id number] 12/29/15 A BROWNING Performed at [facility]    Report Status 12/29/2015 FINAL  Final     Scheduled Meds: . aspirin EC  81 mg Oral Daily  . ciprofloxacin  400 mg Intravenous Q12H  . diltiazem  240 mg Oral Daily  . enoxaparin (LOVENOX) injection  40 mg Subcutaneous Q24H  . hydrocerin   Topical BID  . insulin aspart  0-15 Units Subcutaneous 6 times per day  . levothyroxine  75 mcg Oral QAC breakfast  . losartan  25 mg Oral Daily  . metoprolol  100 mg Oral BID  . pantoprazole  80 mg Oral QAC breakfast  . polyethylene glycol  17 g Oral Daily  . rosuvastatin  5 mg Oral Daily   Continuous Infusions: . sodium chloride 75 mL/hr at 12/31/15 2235     Faye Ramsay, DO  [facility] Pager [phone]  If 7PM-7AM, please contact night-coverage [email or web address] Password [email or web address] 01/01/2016, 2:01 PM   LOS: 3 days

## 2016-01-02 LAB — BASIC METABOLIC PANEL
ANION GAP: 8 (ref 5–15)
BUN: 7 mg/dL (ref 6–20)
CO2: 25 mmol/L (ref 22–32)
Calcium: 8.7 mg/dL — ABNORMAL LOW (ref 8.9–10.3)
Chloride: 107 mmol/L (ref 101–111)
Creatinine, Ser: 1.09 mg/dL — ABNORMAL HIGH (ref 0.44–1.00)
GFR calc Af Amer: 55 mL/min — ABNORMAL LOW (ref >60)
GFR, EST NON AFRICAN AMERICAN: 48 mL/min — AB (ref >60)
GLUCOSE: 108 mg/dL — AB (ref 65–99)
POTASSIUM: 3.7 mmol/L (ref 3.5–5.1)
Sodium: 140 mmol/L (ref 135–145)

## 2016-01-02 LAB — GLUCOSE, CAPILLARY
GLUCOSE-CAPILLARY: 106 mg/dL — AB (ref 65–99)
GLUCOSE-CAPILLARY: 97 mg/dL (ref 65–99)
Glucose-Capillary: 107 mg/dL — ABNORMAL HIGH (ref 65–99)
Glucose-Capillary: 110 mg/dL — ABNORMAL HIGH (ref 65–99)
Glucose-Capillary: 123 mg/dL — ABNORMAL HIGH (ref 65–99)
Glucose-Capillary: 132 mg/dL — ABNORMAL HIGH (ref 65–99)

## 2016-01-02 LAB — CBC
HCT: 34.7 % — ABNORMAL LOW (ref 36.0–46.0)
HEMOGLOBIN: 11.2 g/dL — AB (ref 12.0–15.0)
MCH: 31.5 pg (ref 26.0–34.0)
MCHC: 32.3 g/dL (ref 30.0–36.0)
MCV: 97.7 fL (ref 78.0–100.0)
PLATELETS: 211 10*3/uL (ref 150–400)
RBC: 3.55 MIL/uL — AB (ref 3.87–5.11)
RDW: 13.9 % (ref 11.5–15.5)
WBC: 8.8 10*3/uL (ref 4.0–10.5)

## 2016-01-02 MED ORDER — HYDRALAZINE HCL 20 MG/ML IJ SOLN: 5.0000 mg | INTRAMUSCULAR | Status: DC | PRN

## 2016-01-02 NOTE — Progress Notes (Signed)
Pt educated on flushing and emptying the biliary drain.  Instructions provided with return demonstration.  Nursing staff to provide additional education during routine flushes and care. Patient verbalized understanding and seemed very comfortable.    Jeanpaul Biehl RN

## 2016-01-02 NOTE — Progress Notes (Signed)
  Subjective: She seems to be doing better, no pain, she lives alone.  She has children but they are busy with their own kids, her husband recently passed away.    Objective: Vital signs in last 24 hours: Temp:  [98.6 F (37 C)-99.4 F (37.4 C)] 99.4 F (37.4 C) (02/06 0454) Pulse Rate:  [43-71] 71 (02/06 0454) Resp:  [15-16] 16 (02/06 0454) BP: (157-174)/(77-85) 174/85 mmHg (02/06 0454) SpO2:  [92 %-100 %] 92 % (02/06 0454) Weight:  [80.8 kg (178 lb 2.1 oz)] 80.8 kg (178 lb 2.1 oz) (02/06 [id number]) Last BM Date: 12/27/15 Nothing Po recorded Full liquid diet Afebrile, VSS Labs are stable,  275 from the drain, no pain at site Intake/Output from previous day: 02/05 0701 - 02/06 0700 In: 1412.9 [I.V.:1402.9] Out: 1825 [Urine:1550; Drains:275] Intake/Output this shift:    General appearance: alert, cooperative and no distress GI: soft, minimally sore at drain site, dressing changed and drain flushed.  Lab Results:   Recent Labs  01/01/16 0524 01/02/16 0538  WBC 9.0 8.8  HGB 11.0* 11.2*  HCT 34.3* 34.7*  PLT 183 211    BMET  Recent Labs  01/01/16 0524 01/02/16 0538  NA 139 140  K 3.8 3.7  CL 108 107  CO2 24 25  GLUCOSE 123* 108*  BUN 9 7  CREATININE 1.11* 1.09*  CALCIUM 8.9 8.7*   PT/INR No results for input(s): LABPROT, INR in the last 72 hours.   Recent Labs Lab 12/29/15 0821 12/30/15 0448 01/01/16 0524  AST 18 19 12*  ALT 16 14 11*  ALKPHOS 70 55 50  BILITOT 0.7 0.7 0.7  PROT 7.9 6.8 6.7  ALBUMIN 4.3 3.7 3.2*     Lipase     Component Value Date/Time   LIPASE 37 12/29/2015 0821     Studies/Results: No results found.  Medications: . aspirin EC  81 mg Oral Daily  . ciprofloxacin  400 mg Intravenous Q12H  . diltiazem  240 mg Oral Daily  . enoxaparin (LOVENOX) injection  40 mg Subcutaneous Q24H  . hydrocerin   Topical BID  . insulin aspart  0-15 Units Subcutaneous 6 times per day  . levothyroxine  75 mcg Oral QAC breakfast  . losartan   25 mg Oral Daily  . metoprolol  100 mg Oral BID  . pantoprazole  80 mg Oral QAC breakfast  . polyethylene glycol  17 g Oral Daily  . rosuvastatin  5 mg Oral Daily    Assessment/Plan Large edematous gallbladder & Cholelithiasis  Percutaneous drain placement 12/29/15 - 270 cc recorded last 24 hours. Stage III kidney disease  History of right nephrectomy  Creat - 1.09 - 01/02/2016 AODM Type II Hypertension Anxiety Hypothyroid  Antibiotics: Cipro, Day 5 DVT:  SCD/Lovenox  PlaN:  From our standpoint she can go up to a  low fat, carb modified  Diet.  Follow up with Dr. Marlou Starks about 4 -6 weeks and he can set her up for interval cholecystectomy at that time.  LOS: 4 days    JENNINGS,WILLARD 01/02/2016  Agree with above.  Alphonsa Overall, MD, [facility] Pager: [phone] Office phone:  [phone]

## 2016-01-02 NOTE — Progress Notes (Signed)
PROGRESS NOTE  Sherri Burns [id number] DOB: [birth year] DOA: 12/29/2015  PCP: Jenny Reichmann, MD  Brief History 80 yo female with history of NIDDM, HTN, CKD 3, R-nephrectomy secondary to renal cancer presented with 5 day history of nausea, vomiting,right upper quadrant and epigastricabdominal pain. This started on 12/24/2015 after eating a greasy meal.the pain improved over the next several days as the patient ate lighter meals. However, the patient's pain returned  On 12/28/2015 after eating chicken. She continued to have nausea and vomiting.  She stated that she also had some blood-tinged emesis. She has subjective fevers and chills. She denied any chest discomfort, shortness breath, hematochezia, melena.In the emergency department, workup revealed cholecystitis. General surgery was consulted,and they recommended a percutaneous drain which was placed by interventional radiology 12/29/2015.  Assessment/Plan: Acute cholecystitis -12/29/2015 CT abdomen and pelvis--GB thickening anddistended with pericholecystic fluid -12/29/2015 RUQ US--thickened gallbladderith sludge and pericholecystic fluid -12/29/2015 percutaneous drain -contineu Cipro as fluid culture with Klebsiella and sensitive to Cipro  -stop IVF, advance diet  -Pain control CKD stage III -baseline creatinine 1.1-1.4, repeat BMP in AM -Cr is trending down overall  Diabetes mellitus type 2 -11/29/2015 hemoglobin A1c 6.9 -Patient no longer takes metformin -Hold glipizide until oral intake improves  -NovoLog sliding scale Hypertension -Continue diltiazem CD, losartan, metoprolol tartrate -hold furosemide and resume in AM Anxiety -Continue home dose clonazepam prn Hypothyroidism -Continue Synthroid -TSH 0.550 Hyperlipidemia -Continue statin  Family Communication:   Pt at bedside Disposition Plan:   Home by 2/7  Procedures/Studies: Ct Abdomen Pelvis Wo Contrast  12/29/2015  CLINICAL DATA:  Abdominal pain and  emesis EXAM: CT ABDOMEN AND PELVIS WITHOUT CONTRAST TECHNIQUE: Multidetector CT imaging of the abdomen and pelvis was performed following the standard protocol without IV contrast. COMPARISON:  06/29/2013 FINDINGS: Lung bases are free of acute infiltrate or sizable effusion. The liver, spleen, adrenal glands and pancreas are within normal limits. The right kidney has been surgically removed. The left kidney shows no obstructive changes. A tiny likely hyperdense cyst is noted in the midportion of the left kidney. The gallbladder is well distended demonstrates some wall thickening/ pericholecystic fluid. In the appropriate clinical setting these changes would be consistent with acute cholecystitis. No significant lymphadenopathy is noted. Aortoiliac calcifications are noted. The appendix is within normal limits. The bladder is well distended. The uterus has been surgically removed. Mild diverticular change is noted without evidence of diverticulitis. The bony structures show no acute abnormality. IMPRESSION: Changes suggestive of acute cholecystitis. Right upper quadrant ultrasound would be helpful for further evaluation. The remainder of the exam is stable from the prior study. Electronically Signed   By: Inez Catalina M.D.   On: 12/29/2015 11:47   Ir Perc Cholecystostomy  12/29/2015  INDICATION: 80 year old female with acute cholecystitis and symptoms for the past week. She is a poorly controlled diabetic and in the setting of prolonged symptoms a relatively poor surgical candidate with a high risk for conversion to open cholecystectomy. Percutaneous cholecystostomy tube placement is recommended at this time followed by interval elective laparoscopic cholecystectomy. EXAM: CHOLECYSTOSTOMY MEDICATIONS: Ciprofloxacin 400 mg IV; The antibiotic was administered within an appropriate time frame prior to the initiation of the procedure. ANESTHESIA/SEDATION: Moderate (conscious) sedation was employed during this  procedure. A total of Versed 4 mg and Fentanyl 100 mcg was administered intravenously. Moderate Sedation Time: 12 minutes. The patient's level of consciousness and vital signs were monitored continuously by radiology nursing throughout  the procedure under my direct supervision. FLUOROSCOPY TIME:  Fluoroscopy Time: 1 minutes 18 seconds (66.4 mGy). COMPLICATIONS: None immediate. Estimated blood loss: None PROCEDURE: Informed written consent was obtained from the patient after a thorough discussion of the procedural risks, benefits and alternatives. All questions were addressed. Maximal Sterile Barrier Technique was utilized including caps, mask, sterile gowns, sterile gloves, sterile drape, hand hygiene and skin antiseptic. A timeout was performed prior to the initiation of the procedure. The right upper quadrant was interrogated with ultrasound. The inflamed and thickened gallbladder is successfully identified. A suitable skin entry site was selected and marked. Local anesthesia was attained by infiltration with 1% lidocaine. A small dermatotomy was made. Under real-time sonographic guidance, the gallbladder was punctured using a 21 gauge Accustick needle. The puncture was carried through a short segment of hepatic parenchyma consistent with a transhepatic approach. Gallstones are visualized within the gallbladder lumen. Dark black bile was present in the needle hub after removal of the stylet. The Accustick wire was coiled within the gallbladder lumen and the needle exchanged for the Accustick sheath. A gentle hand injection of contrast material confirmed placement within the gallbladder. The Accustick sheath was then exchanged over a short Amplatz wire and the skin tract dilated to 10 [place]. A Cook 10.2 [place] all-purpose drainage catheter was then advanced over the wire and coiled within the gallbladder lumen. A sample of bile was aspirated and will be sent for culture. A fluoroscopic image was obtained  confirming the presence of the tube within the gallbladder lumen. The tube was secured to the skin with 0 Prolene suture and connected to gravity bag drainage. IMPRESSION: Successful placement of a transhepatic percutaneous cholecystostomy tube for calculus cholecystitis. Signed, Criselda Peaches, MD [facility] Electronically Signed   By: Jacqulynn Cadet M.D.   On: 12/29/2015 17:04   Dg Abd Acute W/chest  12/29/2015  CLINICAL DATA:  Nausea, vomiting, mid abdominal pain intermittently since Saturday, blood in emesis Saturday, occasional smoker, history renal cancer post nephrectomy, diabetes mellitus, hypertension, GERD, smoker EXAM: DG ABDOMEN ACUTE W/ 1V CHEST COMPARISON:  None; correlation CT abdomen pelvis 06/29/2013, CT chest 12/20/2009 FINDINGS: Upper normal heart size with minimal pulmonary vascular congestion. Tortuous aorta. Mediastinal contours normal. Bibasilar atelectasis without infiltrate, pleural effusion, or pneumothorax. Surgical clips RIGHT abdomen from prior nephrectomy. Nonobstructive bowel gas pattern. Scattered stool throughout colon with prominent stool within a low lying cecum in pelvis. No bowel dilatation, bowel wall thickening, or free intraperitoneal air. Pelvic phleboliths and few atherosclerotic calcifications are stable. Bones demineralized. IMPRESSION: Bibasilar atelectasis. No acute abdominal findings. Electronically Signed   By: Lavonia Dana M.D.   On: 12/29/2015 09:33   US Abdomen Limited Ruq  12/29/2015  CLINICAL DATA:  Right upper quadrant pain for 1 week EXAM: US ABDOMEN LIMITED - RIGHT UPPER QUADRANT COMPARISON:  None. FINDINGS: Gallbladder: Sludge and gallstones are identified in the gallbladder. The gallbladder wall measures 3.8 mm. There is pericholecystic fluid. No sonographic Murphy sign noted by sonographer. Common bile duct: Diameter: 5.3 mm Liver: There is diffuse increased echotexture of the liver.  IMPRESSION: Sludge in gallstone identified within the gallbladder with pericholecystic fluid and gallbladder wall thickness of 3.8 mm. The sonographer reports a negative sonographic Murphy sign. The findings are equivocal for acute cholecystitis. Diffuse increased echotexture of the liver, nonspecific but can be seen in fatty infiltration of liver. Electronically Signed   By: Abelardo Diesel M.D.   On: 12/29/2015 14:53  Subjective: Patient states the pain is controlled. Denies any fevers, chills.  Objective: Filed Vitals:   01/01/16 2109 01/02/16 0454 01/02/16 0643 01/02/16 1504  BP: 164/84 174/85  176/83  Pulse: 43 71  65  Temp: 98.6 F (37 C) 99.4 F (37.4 C)  98.7 F (37.1 C)  TempSrc: Oral Oral  Oral  Resp: 15 16  16   Height:      Weight:   80.8 kg (178 lb 2.1 oz)   SpO2: 94% 92%  96%    Intake/Output Summary (Last 24 hours) at 01/02/16 1605 Last data filed at 01/02/16 1504  Gross per 24 hour  Intake 1663.33 ml  Output   1955 ml  Net -291.67 ml   Weight change: -0.2 kg (-7.1 oz) Exam:   General:  Pt is alert, follows commands appropriately, not in acute distress  Cardiovascular: RRR, S1/S2, no rubs, no gallops  Respiratory: ffine bibasilar crackles without wheezing. Good movement.  Abdomen: Soft/+BS, non tender, non distended, no guarding  Extremities: No edema, No lymphangitis, No petechiae, No rashes, no synovitis  Data Reviewed: Basic Metabolic Panel:  Recent Labs Lab 12/29/15 0821 12/30/15 0448 12/31/15 0521 01/01/16 0524 01/02/16 0538  NA 140 140 141 139 140  K 4.4 4.0 4.1 3.8 3.7  CL 103 105 110 108 107  CO2 28 25 25 24 25   GLUCOSE 209* 152* 92 123* 108*  BUN 16 12 11 9 7   CREATININE 1.39* 1.15* 1.27* 1.11* 1.09*  CALCIUM 9.3 9.0 9.1 8.9 8.7*   Liver Function Tests:  Recent Labs Lab 12/29/15 0821 12/30/15 0448 01/01/16 0524  AST 18 19 12*  ALT 16 14 11*  ALKPHOS 70 55 50  BILITOT 0.7 0.7 0.7  PROT 7.9 6.8 6.7  ALBUMIN 4.3 3.7 3.2*     Recent Labs Lab 12/29/15 0821  LIPASE 37   No results for input(s): AMMONIA in the last 168 hours. CBC:  Recent Labs Lab 12/29/15 0821 12/30/15 0448 12/31/15 0521 01/01/16 0524 01/02/16 0538  WBC 10.7* 10.0 9.1 9.0 8.8  NEUTROABS 8.9*  --   --   --   --   HGB 12.4 12.1 11.6* 11.0* 11.2*  HCT 37.4 37.3 35.9* 34.3* 34.7*  MCV 95.7 97.9 98.1 98.3 97.7  PLT 199 177 175 183 211   CBG:  Recent Labs Lab 01/01/16 1933 01/02/16 0037 01/02/16 0448 01/02/16 0743 01/02/16 1148  GLUCAP 81 97 107* 106* 110*    Recent Results (from the past 240 hour(s))  Culture, body fluid-bottle     Status: None   Collection Time: 12/29/15  5:07 PM  Result Value Ref Range Status   Specimen Description BILE  Final   Special Requests BOTTLES DRAWN AEROBIC AND ANAEROBIC 5CC  Final   Gram Stain   Final    GRAM VARIABLE ROD IN BOTH AEROBIC AND ANAEROBIC BOTTLES CRITICAL RESULT CALLED TO, READ BACK BY AND VERIFIED WITH: A MATHIS,RN @0405  12/30/15 MKELLY    Culture   Final    KLEBSIELLA PNEUMONIAE Performed at [facility]    Report Status 01/01/2016 FINAL  Final   Organism ID, Bacteria KLEBSIELLA PNEUMONIAE  Final      Susceptibility   Klebsiella pneumoniae - MIC*    AMPICILLIN >=32 RESISTANT Resistant     CEFAZOLIN <=4 SENSITIVE Sensitive     CEFEPIME <=1 SENSITIVE Sensitive     CEFTAZIDIME <=1 SENSITIVE Sensitive     CEFTRIAXONE <=1 SENSITIVE Sensitive     CIPROFLOXACIN <=0.25 SENSITIVE Sensitive  GENTAMICIN <=1 SENSITIVE Sensitive     IMIPENEM <=0.25 SENSITIVE Sensitive     TRIMETH/SULFA <=20 SENSITIVE Sensitive     AMPICILLIN/SULBACTAM 16 INTERMEDIATE Intermediate     PIP/TAZO 16 SENSITIVE Sensitive     * KLEBSIELLA PNEUMONIAE  Gram stain     Status: None   Collection Time: 12/29/15  5:07 PM  Result Value Ref Range Status   Specimen Description BILE  Final   Special Requests NONE  Final   Gram Stain   Final    RARE WBC PRESENT,BOTH PMN AND MONONUCLEAR ABUNDANT  GRAM NEGATIVE RODS Gram Stain Report Called to,Read Back By and Verified With: M MATHIS RN [id number] 12/29/15 A BROWNING Performed at [facility]    Report Status 12/29/2015 FINAL  Final     Scheduled Meds: . aspirin EC  81 mg Oral Daily  . ciprofloxacin  400 mg Intravenous Q12H  . diltiazem  240 mg Oral Daily  . enoxaparin (LOVENOX) injection  40 mg Subcutaneous Q24H  . hydrocerin   Topical BID  . insulin aspart  0-15 Units Subcutaneous 6 times per day  . levothyroxine  75 mcg Oral QAC breakfast  . losartan  25 mg Oral Daily  . metoprolol  100 mg Oral BID  . pantoprazole  80 mg Oral QAC breakfast  . polyethylene glycol  17 g Oral Daily  . rosuvastatin  5 mg Oral Daily   Continuous Infusions: . sodium chloride 50 mL/hr at 01/02/16 1400     Sherri Burns, ISKRA, DO  [facility] Pager [phone]  If 7PM-7AM, please contact night-coverage [email or web address] Password [email or web address] 01/02/2016, 4:05 PM   LOS: 4 days

## 2016-01-02 NOTE — Care Management Important Message (Signed)
Important Message  Patient Details  Name: Sherri Burns MRN: [id number] Date of Birth: [birth year]   Medicare Important Message Given:  Yes    Camillo Flaming 01/02/2016, 12:04 [place] Message  Patient Details  Name: Sherri Burns MRN: [id number] Date of Birth: 22-May-1936   Medicare Important Message Given:  Yes    Camillo Flaming 01/02/2016, 12:04 PM

## 2016-01-02 NOTE — Evaluation (Signed)
Physical Therapy Evaluation Patient Details Name: Sherri Burns MRN: [id number] DOB: 1936/10/02 Today's Date: 01/02/2016   History of Present Illness  80 y.o. female with h/o DM2, HTN, CKD, nephrectomy 2* renal cancer admitted with cholecystitis. s/p drain 12/29/15  Clinical Impression  Pt ambulated 400' in hall with occasional use of handrail for support. She is independent with bed mobility and transfers. Encouraged pt to continue walking in halls 2-3x/day. No further acute PT indicated.     Follow Up Recommendations No PT follow up    Equipment Recommendations  None recommended by PT    Recommendations for Other Services       Precautions / Restrictions Precautions Precautions: Other (comment) Precaution Comments: biliary drain RUQ Restrictions Weight Bearing Restrictions: No      Mobility  Bed Mobility Overal bed mobility: Independent                Transfers Overall transfer level: Independent                  Ambulation/Gait Ambulation/Gait assistance: Modified independent (Device/Increase time) Ambulation Distance (Feet): 400 Feet Assistive device: None Gait Pattern/deviations: WFL(Within Functional Limits)   Gait velocity interpretation: at or above normal speed for age/gender General Gait Details: pt occaissionally reaches for handrail in hall, no LOB  Stairs            Wheelchair Mobility    Modified Rankin (Stroke Patients Only)       Balance Overall balance assessment: Modified Independent                                           Pertinent Vitals/Pain Pain Assessment: 0-10 Pain Score: 4  Pain Location: RUQ Pain Descriptors / Indicators: Sore Pain Intervention(s): Monitored during session    Home Living Family/patient expects to be discharged to:: Private residence Living Arrangements: Alone Available Help at Discharge: Family;Friend(s);Available PRN/intermittently Type of Home: House Home Access:  Stairs to enter   [employer] of Steps: 1 Home Layout: One level Home Equipment: Cane - single point      Prior Function Level of Independence: Independent               Hand Dominance        Extremity/Trunk Assessment   Upper Extremity Assessment: Overall WFL for tasks assessed           Lower Extremity Assessment: Overall WFL for tasks assessed      Cervical / Trunk Assessment: Normal  Communication   Communication: No difficulties  Cognition Arousal/Alertness: Awake/alert Behavior During Therapy: WFL for tasks assessed/performed Overall Cognitive Status: Within Functional Limits for tasks assessed                      General Comments      Exercises        Assessment/Plan    PT Assessment Patent does not need any further PT services  PT Diagnosis     PT Problem List    PT Treatment Interventions     PT Goals (Current goals can be found in the Care Plan section) Acute Rehab PT Goals Patient Stated Goal: pt likes to decorate her home and take care of her plants PT Goal Formulation: All assessment and education complete, DC therapy    Frequency     Barriers to discharge  Co-evaluation               End of Session   Activity Tolerance: Patient tolerated treatment well Patient left: in bed;with call bell/phone within reach Nurse Communication: Mobility status         Time: [id number] PT Time Calculation (min) (ACUTE ONLY): 23 min   Charges:   PT Evaluation $PT Eval Low Complexity: 1 Procedure PT Treatments $Gait Training: 8-22 mins   PT G Codes:        Sherri Burns 01/02/2016, 11:06 AM [phone]

## 2016-01-03 DIAGNOSIS — K810 Acute cholecystitis: Secondary | ICD-10-CM | POA: Insufficient documentation

## 2016-01-03 LAB — BASIC METABOLIC PANEL
ANION GAP: 8 (ref 5–15)
BUN: 9 mg/dL (ref 6–20)
CO2: 27 mmol/L (ref 22–32)
Calcium: 8.8 mg/dL — ABNORMAL LOW (ref 8.9–10.3)
Chloride: 106 mmol/L (ref 101–111)
Creatinine, Ser: 1.16 mg/dL — ABNORMAL HIGH (ref 0.44–1.00)
GFR calc non Af Amer: 44 mL/min — ABNORMAL LOW (ref >60)
GFR, EST AFRICAN AMERICAN: 51 mL/min — AB (ref >60)
GLUCOSE: 120 mg/dL — AB (ref 65–99)
POTASSIUM: 3.9 mmol/L (ref 3.5–5.1)
Sodium: 141 mmol/L (ref 135–145)

## 2016-01-03 LAB — CBC
HEMATOCRIT: 35.8 % — AB (ref 36.0–46.0)
Hemoglobin: 11.6 g/dL — ABNORMAL LOW (ref 12.0–15.0)
MCH: 31.6 pg (ref 26.0–34.0)
MCHC: 32.4 g/dL (ref 30.0–36.0)
MCV: 97.5 fL (ref 78.0–100.0)
PLATELETS: 232 10*3/uL (ref 150–400)
RBC: 3.67 MIL/uL — AB (ref 3.87–5.11)
RDW: 13.9 % (ref 11.5–15.5)
WBC: 7.9 10*3/uL (ref 4.0–10.5)

## 2016-01-03 LAB — GLUCOSE, CAPILLARY
GLUCOSE-CAPILLARY: 102 mg/dL — AB (ref 65–99)
GLUCOSE-CAPILLARY: 87 mg/dL (ref 65–99)
Glucose-Capillary: 109 mg/dL — ABNORMAL HIGH (ref 65–99)
Glucose-Capillary: 157 mg/dL — ABNORMAL HIGH (ref 65–99)

## 2016-01-03 MED ORDER — OXYCODONE HCL 5 MG PO TABS: 5.0000 mg | ORAL_TABLET | ORAL | Status: DC | PRN

## 2016-01-03 MED ORDER — ONDANSETRON HCL 4 MG PO TABS: 4.0000 mg | ORAL_TABLET | Freq: Four times a day (QID) | ORAL | Status: DC | PRN

## 2016-01-03 MED ORDER — CIPROFLOXACIN HCL 500 MG PO TABS: 500.0000 mg | ORAL_TABLET | Freq: Two times a day (BID) | ORAL | Status: DC

## 2016-01-03 NOTE — Telephone Encounter (Signed)
PT was also released from the hospital recently due to infection in gallbladder

## 2016-01-03 NOTE — Progress Notes (Signed)
  Subjective: She is doing well, she is getting use to the drain, said she flushed it.  No pain over site.    Objective: Vital signs in last 24 hours: Temp:  [98.3 F (36.8 C)-98.8 F (37.1 C)] 98.3 F (36.8 C) (02/07 0523) Pulse Rate:  [65-78] 78 (02/07 0523) Resp:  [16-18] 18 (02/07 0523) BP: (173-178)/(80-83) 173/80 mmHg (02/07 0523) SpO2:  [96 %-100 %] 100 % (02/07 0523) Weight:  [81.7 kg (180 lb 1.9 oz)] 81.7 kg (180 lb 1.9 oz) (02/07 0431) Last BM Date: 12/30/15 480 PO recorded Drain 130 recorded yesterday Afebrile, VSS, BP still up some  Creatinine is 1.16 WBC is 7.9  Intake/Output from previous day: 02/06 0701 - 02/07 0700 In: 880 [P.O.:480; I.V.:400] Out: 1180 [Urine:1050; Drains:130] Intake/Output this shift:    General appearance: alert, cooperative and no distress GI: abnormal findings:  drain is in place working well, no complaints of pain or tenderness.  Lab Results:   Recent Labs  01/02/16 0538 01/03/16 0455  WBC 8.8 7.9  HGB 11.2* 11.6*  HCT 34.7* 35.8*  PLT 211 232    BMET  Recent Labs  01/02/16 0538 01/03/16 0455  NA 140 141  K 3.7 3.9  CL 107 106  CO2 25 27  GLUCOSE 108* 120*  BUN 7 9  CREATININE 1.09* 1.16*  CALCIUM 8.7* 8.8*   PT/INR No results for input(s): LABPROT, INR in the last 72 hours.   Recent Labs Lab 12/29/15 0821 12/30/15 0448 01/01/16 0524  AST 18 19 12*  ALT 16 14 11*  ALKPHOS 70 55 50  BILITOT 0.7 0.7 0.7  PROT 7.9 6.8 6.7  ALBUMIN 4.3 3.7 3.2*     Lipase     Component Value Date/Time   LIPASE 37 12/29/2015 0821     Studies/Results: No results found.  Medications: . aspirin EC  81 mg Oral Daily  . ciprofloxacin  400 mg Intravenous Q12H  . diltiazem  240 mg Oral Daily  . enoxaparin (LOVENOX) injection  40 mg Subcutaneous Q24H  . hydrocerin   Topical BID  . insulin aspart  0-15 Units Subcutaneous 6 times per day  . levothyroxine  75 mcg Oral QAC breakfast  . losartan  25 mg Oral Daily  .  metoprolol  100 mg Oral BID  . pantoprazole  80 mg Oral QAC breakfast  . polyethylene glycol  17 g Oral Daily  . rosuvastatin  5 mg Oral Daily    Assessment/Plan Large edematous gallbladder & Cholelithiasis Percutaneous drain placement 12/29/15  Stage III kidney disease History of right nephrectomy Creat - 1.09 - 01/02/2016 AODM Type II Hypertension Anxiety Hypothyroid Antibiotics: Cipro, Day 5 DVT: SCD/Lovenox    Plan:  Follow up instructions are in the AVS, we will see her back in the office.      LOS: 5 days    JENNINGS,WILLARD 01/03/2016  Agree with above. She was ready to go home and doing well.  Alphonsa Overall, MD, [facility] Pager: [phone] Office phone:  [phone]

## 2016-01-03 NOTE — Telephone Encounter (Signed)
PT states she has an RX problem... The Antibiotic that she has be prescribed is causing her blood sugar to drop... Pt would like phone consultation about medication reactions/// she states she will stop taking the rx until she hears from [place]. Shared Dr. Caren Griffins walkin hrs....  [phone] NO VM please

## 2016-01-03 NOTE — Discharge Instructions (Signed)
Biliary Drainage Catheter Home Guide °A biliary drainage catheter is a tube that is inserted through your skin into the bile ducts in your liver. The purpose of a biliary drainage catheter is to prevent backup of bile into the liver. Bile is a thick yellow or green fluid that helps digest fat in foods. Backup of bile can occur when there is a blockage preventing bile from moving from the bile ducts into your small intestine, as it normally should. This can occur from a tumor, gallstones, or scar tissue. There are three types of biliary drainage: °· External biliary drainage--With this type, bile is only drained into a collection bag outside your body (external collection bag). °· Internal-external biliary drainage--Bile is drained to an external collection bag as well as into your small intestine. °· Internal biliary drainage--Bile is only drained into your small intestine. °HOW DO I CHANGE MY DRESSING? °The dressing over the drain should be changed at least every other day or more frequently if needed to keep the dressing dry.  °1. Wash your hands with soap and water. °2. Gently remove the old dressing. Avoid using scissors to remove the dressing because this may lead to accidental damage to the drain. °3. Once the dressing is removed, inspect the skin around the drain for redness, swelling, and foul smelling yellow or green discharge. °4. If the drain was sutured to the skin, inspect the suture to verify that it is still anchored in the skin. °5. Clean the skin around the insertion site with mild soap and warm water. Pat the area dry with a clean cloth. °6. Do not apply creams, ointments, or alcohol to the site. Allow the skin to air dry completely before applying a new dressing. °7. Use a drain sponge (4x4 split gauze) and place the drain through the slit. Cover the drain and the first gauze with a 4x4 gauze. The drain should rest on the gauze and not on the skin. °8. Tape the dressing to the skin. °9. Wash your  hands with soap and water. °HOW DO I FLUSH A DRAIN NOT ATTACHED TO A BAG? °Biliary drains should be flushed daily unless you are instructed otherwise by a health care provider. The end of the drain is closed using an IV cap to which a syringe can be directly connected.  °1. Clean the IV cap with an alcohol swab and then screw the tip of a 10 ml normal saline syringe onto the IV cap. °2. Inject the saline over 5-10 seconds. If you feel resistance while injecting, stop immediately. °3. Remove the syringe from the cap. °HOW DO I ATTACH A BAG TO MY DRAIN? °If you are having trouble with your biliary drain, you may be directed by your health care provider to use bag drainage until you can be seen to fix the problem. You should always have a drainage bag and connecting tubing at home for this reason. If you do not, remember to ask for these at your next appointment.  °1. Remove the bag and connecting tubing from their packaging. °2. Connect the funnel end of the tubing to the bag's cone-shaped stem. °3. Remove the IV cap from the biliary drain by unscrewing it and replace it with the screw-on end of the tubing. °4. Save the IV cap in a sealable plastic storage bag. °HOW DO I EMPTY MY DRAINAGE BAG? °The drainage bag should be emptied when it becomes 2/3 full or before you go to sleep. Most drainage bags have a drainage   valve at the bottom that allows them to be emptied easily. °1. Hold the bag over the toilet or basin (or measuring container, if you are directed to measure the drainage). °2. Unscrew the valve to open it, and allow the bag to drain. °3. Close the valve securely to avoid leakage, and wipe it clean with a tissue or disposable napkin. °SEEK MEDICAL CARE IF: °· Your pain gets worse after an initial improvement. °· You have any questions about your tube. °· Your redness, soreness, or swelling at the tube insertion site gets worse despite good cleaning. °· Your skin breaks down around the tube. °· You have  leakage of bile around the tube. °· Your tube becomes blocked or clogged. °· Your catheter is dislodged or comes out. °· You have a fever. °· You have chills or increased pain. °MAKE SURE YOU: °· Understand these instructions. °· Will watch your condition. °· Will get help right away if you are not doing well or get worse. °  °This information is not intended to replace advice given to you by your health care provider. Make sure you discuss any questions you have with your health care provider. °  °Document Released: 09/02/2013 Document Revised: 11/17/2013 Document Reviewed: 09/02/2013 °Elsevier Interactive Patient Education ©2016 [employer] ° °

## 2016-01-03 NOTE — Discharge Summary (Signed)
Physician Discharge Summary  Sherri Burns [id number] DOB: 1936/08/23 DOA: 12/29/2015  PCP: Jenny Reichmann, MD  Admit date: 12/29/2015 Discharge date: 01/03/2016  Recommendations for Outpatient Follow-up:  1. Pt will need to follow up with PCP in 1 week post discharge 2. Please obtain BMP to evaluate electrolytes and kidney function 3. Please also check CBC to evaluate Hg and Hct levels 4. Pt reported she no longer takes Metformin so medication was removed from her med list   Discharge Diagnoses:  Principal Problem:   Cholecystitis Active Problems:   Acute calculous cholecystitis   CKD (chronic kidney disease), stage III   Essential hypertension   Hypothyroidism   Type 2 diabetes mellitus with diabetic polyneuropathy, without long-term current use of insulin (HCC)  Discharge Condition: Stable  Diet recommendation: Heart healthy diet discussed in details    Brief History 80 yo female with history of NIDDM, HTN, CKD 3, R-nephrectomy secondary to renal cancer presented with 5 day history of nausea, vomiting,right upper quadrant and epigastricabdominal pain. This started on 12/24/2015 after eating a greasy meal.the pain improved over the next several days as the patient ate lighter meals. However, the patient's pain returned On 12/28/2015 after eating chicken. She continued to have nausea and vomiting. She stated that she also had some blood-tinged emesis. She has subjective fevers and chills. She denied any chest discomfort, shortness breath, hematochezia, melena.In the emergency department, workup revealed cholecystitis. General surgery was consulted,and they recommended a percutaneous drain which was placed by interventional radiology 12/29/2015.  Assessment/Plan: Acute cholecystitis -12/29/2015 CT abdomen and pelvis--GB thickening anddistended with pericholecystic fluid -12/29/2015 RUQ US--thickened gallbladderith sludge and pericholecystic fluid -12/29/2015 percutaneous  drain -contineu Cipro as fluid culture with Klebsiella and sensitive to Cipro   CKD stage III -baseline creatinine 1.1-1.4 -Cr is trending down overall   Diabetes mellitus type 2 -11/29/2015 hemoglobin A1c 6.9 -Patient no longer takes metformin  Hypertension -Continue home medical regimen   Anxiety -Continue home dose clonazepam prn  Hypothyroidism -Continue Synthroid -TSH 0.550  Hyperlipidemia -Continue statin  Family Communication: Pt at bedside Disposition Plan: Home   Procedures/Studies:  Imaging Results    Ct Abdomen Pelvis Wo Contrast  12/29/2015 CLINICAL DATA: Abdominal pain and emesis EXAM: CT ABDOMEN AND PELVIS WITHOUT CONTRAST TECHNIQUE: Multidetector CT imaging of the abdomen and pelvis was performed following the standard protocol without IV contrast. COMPARISON: 06/29/2013 FINDINGS: Lung bases are free of acute infiltrate or sizable effusion. The liver, spleen, adrenal glands and pancreas are within normal limits. The right kidney has been surgically removed. The left kidney shows no obstructive changes. A tiny likely hyperdense cyst is noted in the midportion of the left kidney. The gallbladder is well distended demonstrates some wall thickening/ pericholecystic fluid. In the appropriate clinical setting these changes would be consistent with acute cholecystitis. No significant lymphadenopathy is noted. Aortoiliac calcifications are noted. The appendix is within normal limits. The bladder is well distended. The uterus has been surgically removed. Mild diverticular change is noted without evidence of diverticulitis. The bony structures show no acute abnormality. IMPRESSION: Changes suggestive of acute cholecystitis. Right upper quadrant ultrasound would be helpful for further evaluation. The remainder of the exam is stable from the prior study. Electronically Signed By: Inez Catalina M.D. On: 12/29/2015 11:47   Ir Perc Cholecystostomy  12/29/2015 INDICATION:  80 year old female with acute cholecystitis and symptoms for the past week. She is a poorly controlled diabetic and in the setting of prolonged symptoms a relatively poor surgical candidate with a  high risk for conversion to open cholecystectomy. Percutaneous cholecystostomy tube placement is recommended at this time followed by interval elective laparoscopic cholecystectomy. EXAM: CHOLECYSTOSTOMY MEDICATIONS: Ciprofloxacin 400 mg IV; The antibiotic was administered within an appropriate time frame prior to the initiation of the procedure. ANESTHESIA/SEDATION: Moderate (conscious) sedation was employed during this procedure. A total of Versed 4 mg and Fentanyl 100 mcg was administered intravenously. Moderate Sedation Time: 12 minutes. The patient's level of consciousness and vital signs were monitored continuously by radiology nursing throughout the procedure under my direct supervision. FLUOROSCOPY TIME: Fluoroscopy Time: 1 minutes 18 seconds (66.4 mGy). COMPLICATIONS: None immediate. Estimated blood loss: None PROCEDURE: Informed written consent was obtained from the patient after a thorough discussion of the procedural risks, benefits and alternatives. All questions were addressed. Maximal Sterile Barrier Technique was utilized including caps, mask, sterile gowns, sterile gloves, sterile drape, hand hygiene and skin antiseptic. A timeout was performed prior to the initiation of the procedure. The right upper quadrant was interrogated with ultrasound. The inflamed and thickened gallbladder is successfully identified. A suitable skin entry site was selected and marked. Local anesthesia was attained by infiltration with 1% lidocaine. A small dermatotomy was made. Under real-time sonographic guidance, the gallbladder was punctured using a 21 gauge Accustick needle. The puncture was carried through a short segment of hepatic parenchyma consistent with a transhepatic approach. Gallstones are visualized within the  gallbladder lumen. Dark black bile was present in the needle hub after removal of the stylet. The Accustick wire was coiled within the gallbladder lumen and the needle exchanged for the Accustick sheath. A gentle hand injection of contrast material confirmed placement within the gallbladder. The Accustick sheath was then exchanged over a short Amplatz wire and the skin tract dilated to 10 [place]. A Cook 10.2 [place] all-purpose drainage catheter was then advanced over the wire and coiled within the gallbladder lumen. A sample of bile was aspirated and will be sent for culture. A fluoroscopic image was obtained confirming the presence of the tube within the gallbladder lumen. The tube was secured to the skin with 0 Prolene suture and connected to gravity bag drainage. IMPRESSION: Successful placement of a transhepatic percutaneous cholecystostomy tube for calculus cholecystitis. Signed, Criselda Peaches, MD [facility] Electronically Signed By: Jacqulynn Cadet M.D. On: 12/29/2015 17:04   Dg Abd Acute W/chest  12/29/2015 CLINICAL DATA: Nausea, vomiting, mid abdominal pain intermittently since Saturday, blood in emesis Saturday, occasional smoker, history renal cancer post nephrectomy, diabetes mellitus, hypertension, GERD, smoker EXAM: DG ABDOMEN ACUTE W/ 1V CHEST COMPARISON: None; correlation CT abdomen pelvis 06/29/2013, CT chest 12/20/2009 FINDINGS: Upper normal heart size with minimal pulmonary vascular congestion. Tortuous aorta. Mediastinal contours normal. Bibasilar atelectasis without infiltrate, pleural effusion, or pneumothorax. Surgical clips RIGHT abdomen from prior nephrectomy. Nonobstructive bowel gas pattern. Scattered stool throughout colon with prominent stool within a low lying cecum in pelvis. No bowel dilatation, bowel wall thickening, or free intraperitoneal air. Pelvic phleboliths and few atherosclerotic calcifications are  stable. Bones demineralized. IMPRESSION: Bibasilar atelectasis. No acute abdominal findings. Electronically Signed By: Lavonia Dana M.D. On: 12/29/2015 09:33   US Abdomen Limited Ruq  12/29/2015 CLINICAL DATA: Right upper quadrant pain for 1 week EXAM: US ABDOMEN LIMITED - RIGHT UPPER QUADRANT COMPARISON: None. FINDINGS: Gallbladder: Sludge and gallstones are identified in the gallbladder. The gallbladder wall measures 3.8 mm. There is pericholecystic fluid. No sonographic Murphy sign noted by sonographer. Common bile duct: Diameter: 5.3 mm Liver: There is diffuse  increased echotexture of the liver. IMPRESSION: Sludge in gallstone identified within the gallbladder with pericholecystic fluid and gallbladder wall thickness of 3.8 mm. The sonographer reports a negative sonographic Murphy sign. The findings are equivocal for acute cholecystitis. Diffuse increased echotexture of the liver, nonspecific but can be seen in fatty infiltration of liver. Electronically Signed By: Abelardo Diesel M.D. On: 12/29/2015 14:53           Discharge Exam: Filed Vitals:   01/03/16 0523 01/03/16 1030  BP: 173/80 162/85  Pulse: 78 57  Temp: 98.3 F (36.8 C) 99.2 F (37.3 C)  Resp: 18 18   Filed Vitals:   01/02/16 2127 01/03/16 0431 01/03/16 0523 01/03/16 1030  BP: 178/81  173/80 162/85  Pulse: 75  78 57  Temp: 98.8 F (37.1 C)  98.3 F (36.8 C) 99.2 F (37.3 C)  TempSrc: Oral  Oral Oral  Resp: 16  18 18   Height:      Weight:  81.7 kg (180 lb 1.9 oz)    SpO2: 97%  100% 96%    General: Pt is alert, follows commands appropriately, not in acute distress Cardiovascular: Regular rate and rhythm, S1/S2 +, no murmurs, no rubs, no gallops Respiratory: Clear to auscultation bilaterally, no wheezing, no crackles, no rhonchi Abdominal: Soft, non tender, non distended, bowel sounds +, no guarding   Discharge Instructions  Discharge Instructions    Diet - low sodium heart healthy    Complete by:  As  directed      Increase activity slowly    Complete by:  As directed             Medication List    STOP taking these medications        metFORMIN 500 MG 24 hr tablet  Commonly known as:  GLUCOPHAGE-XR      TAKE these medications        aspirin 81 MG tablet  Take 81 mg by mouth daily.     ciprofloxacin 500 MG tablet  Commonly known as:  CIPRO  Take 1 tablet (500 mg total) by mouth 2 (two) times daily.     clonazePAM 1 MG tablet  Commonly known as:  KLONOPIN  Take 1/2 tab in AM and 1/2 tab in afternoon and 1 at HS     diltiazem 240 MG 24 hr capsule  Commonly known as:  CARTIA XT  Take 1 capsule (240 mg total) by mouth daily.     fluticasone 50 MCG/ACT nasal spray  Commonly known as:  FLONASE  USE 2 SPRAYS IN EACH NOSTRIL EVERY DAY     furosemide 40 MG tablet  Commonly known as:  LASIX  TAKE 1 TABLET BY MOUTH EVERY DAY     glipiZIDE 10 MG 24 hr tablet  Commonly known as:  GLUCOTROL XL  TAKE 1 TABLET BY MOUTH TWICE DAILY     levothyroxine 75 MCG tablet  Commonly known as:  SYNTHROID, LEVOTHROID  Take 1 tablet (75 mcg total) by mouth daily before breakfast.     losartan 25 MG tablet  Commonly known as:  COZAAR  Take 1 tablet daily     metoprolol 100 MG tablet  Commonly known as:  LOPRESSOR  TAKE 1 TABLET BY MOUTH TWICE DAILY     miconazole 2 % cream  Commonly known as:  MICATIN  Apply 1 application topically 2 (two) times daily.     mupirocin ointment 2 %  Commonly known as:  BACTROBAN  APPLY TO THE  AREAS AROUND THE BASE OF THE GREAT TOENAIL TWICE DAILY     omeprazole 40 MG capsule  Commonly known as:  PRILOSEC  Take 40 mg by mouth daily.     ondansetron 4 MG tablet  Commonly known as:  ZOFRAN  Take 1 tablet (4 mg total) by mouth every 6 (six) hours as needed for nausea.     oxyCODONE 5 MG immediate release tablet  Commonly known as:  Oxy IR/ROXICODONE  Take 1 tablet (5 mg total) by mouth every 4 (four) hours as needed for moderate pain.      rosuvastatin 5 MG tablet  Commonly known as:  CRESTOR  Take 1 tablet (5 mg total) by mouth daily.     VITAMIN D-3 PO  Take 1 tablet by mouth daily.     vitamin E 100 UNIT capsule  Take 100 Units by mouth daily.           Follow-up Information    Follow up with TOTH III,PAUL S, MD In 4 weeks.   Specialty:  General Surgery   Why:  Call and make an appointment in about 4-5 weeks, and discuss removal of gallbladder.   Contact information:   [address] [address] [phone]       Follow up with Jenny Reichmann, MD.   Specialty:  Family Medicine   Contact information:   [place] [id number] [phone]       Call Faye Ramsay, MD.   Specialty:  Internal Medicine   Why:  As needed call my cell phone [phone]   Contact information:   [address] [place] [address] [phone]        The results of significant diagnostics from this hospitalization (including imaging, microbiology, ancillary and laboratory) are listed below for reference.     Microbiology: Recent Results (from the past 240 hour(s))  Culture, body fluid-bottle     Status: None   Collection Time: 12/29/15  5:07 PM  Result Value Ref Range Status   Specimen Description BILE  Final   Special Requests BOTTLES DRAWN AEROBIC AND ANAEROBIC 5CC  Final   Gram Stain   Final    GRAM VARIABLE ROD IN BOTH AEROBIC AND ANAEROBIC BOTTLES CRITICAL RESULT CALLED TO, READ BACK BY AND VERIFIED WITH: A MATHIS,RN @0405  12/30/15 MKELLY    Culture   Final    KLEBSIELLA PNEUMONIAE Performed at [facility]    Report Status 01/01/2016 FINAL  Final   Organism ID, Bacteria KLEBSIELLA PNEUMONIAE  Final      Susceptibility   Klebsiella pneumoniae - MIC*    AMPICILLIN >=32 RESISTANT Resistant     CEFAZOLIN <=4 SENSITIVE Sensitive     CEFEPIME <=1 SENSITIVE Sensitive     CEFTAZIDIME <=1 SENSITIVE Sensitive     CEFTRIAXONE <=1 SENSITIVE  Sensitive     CIPROFLOXACIN <=0.25 SENSITIVE Sensitive     GENTAMICIN <=1 SENSITIVE Sensitive     IMIPENEM <=0.25 SENSITIVE Sensitive     TRIMETH/SULFA <=20 SENSITIVE Sensitive     AMPICILLIN/SULBACTAM 16 INTERMEDIATE Intermediate     PIP/TAZO 16 SENSITIVE Sensitive     * KLEBSIELLA PNEUMONIAE  Gram stain     Status: None   Collection Time: 12/29/15  5:07 PM  Result Value Ref Range Status   Specimen Description BILE  Final   Special Requests NONE  Final   Gram Stain   Final    RARE WBC PRESENT,BOTH PMN  AND MONONUCLEAR ABUNDANT GRAM NEGATIVE RODS Gram Stain Report Called to,Read Back By and Verified With: M MATHIS RN [id number] 12/29/15 A BROWNING Performed at [facility]    Report Status 12/29/2015 FINAL  Final     Labs: Basic Metabolic Panel:  Recent Labs Lab 12/30/15 0448 12/31/15 0521 01/01/16 0524 01/02/16 0538 01/03/16 0455  NA 140 141 139 140 141  K 4.0 4.1 3.8 3.7 3.9  CL 105 110 108 107 106  CO2 25 25 24 25 27   GLUCOSE 152* 92 123* 108* 120*  BUN 12 11 9 7 9   CREATININE 1.15* 1.27* 1.11* 1.09* 1.16*  CALCIUM 9.0 9.1 8.9 8.7* 8.8*   Liver Function Tests:  Recent Labs Lab 12/29/15 0821 12/30/15 0448 01/01/16 0524  AST 18 19 12*  ALT 16 14 11*  ALKPHOS 70 55 50  BILITOT 0.7 0.7 0.7  PROT 7.9 6.8 6.7  ALBUMIN 4.3 3.7 3.2*    Recent Labs Lab 12/29/15 0821  LIPASE 37   CBC:  Recent Labs Lab 12/29/15 0821 12/30/15 0448 12/31/15 0521 01/01/16 0524 01/02/16 0538 01/03/16 0455  WBC 10.7* 10.0 9.1 9.0 8.8 7.9  NEUTROABS 8.9*  --   --   --   --   --   HGB 12.4 12.1 11.6* 11.0* 11.2* 11.6*  HCT 37.4 37.3 35.9* 34.3* 34.7* 35.8*  MCV 95.7 97.9 98.1 98.3 97.7 97.5  PLT 199 177 175 183 211 232   CBG:  Recent Labs Lab 01/02/16 1618 01/02/16 2001 01/03/16 0012 01/03/16 0338 01/03/16 0812  GLUCAP 123* 132* 109* 102* 87   SIGNED: Time coordinating discharge: 30 minutes  MAGICK-Jazlynn Nemetz, MD  [facility] 01/03/2016, 10:48  AM Pager [phone]  If 7PM-7AM, please contact night-coverage [email or web address] Password [email or web address]

## 2016-01-03 NOTE — Progress Notes (Signed)
Pt's vitals WNL, tolerating diet and pain is under control. Discussed discharge instructions with patient and also discussed how to flush and empty drain. Pt was able to verbalize and show return demonstration of how to flush and empty drain. Discharged to home with prescriptions.

## 2016-01-04 ENCOUNTER — Ambulatory Visit (INDEPENDENT_AMBULATORY_CARE_PROVIDER_SITE_OTHER): Payer: Medicare Other | Admitting: Emergency Medicine

## 2016-01-04 VITALS — BP 158/96 | HR 76 | Temp 99.0°F | Resp 16 | Ht 65.5 in | Wt 178.0 lb

## 2016-01-04 DIAGNOSIS — E038 Other specified hypothyroidism: Secondary | ICD-10-CM | POA: Diagnosis not present

## 2016-01-04 DIAGNOSIS — Z658 Other specified problems related to psychosocial circumstances: Secondary | ICD-10-CM | POA: Diagnosis not present

## 2016-01-04 DIAGNOSIS — F329 Major depressive disorder, single episode, unspecified: Secondary | ICD-10-CM | POA: Diagnosis not present

## 2016-01-04 DIAGNOSIS — R739 Hyperglycemia, unspecified: Secondary | ICD-10-CM | POA: Diagnosis not present

## 2016-01-04 DIAGNOSIS — R601 Generalized edema: Secondary | ICD-10-CM | POA: Diagnosis not present

## 2016-01-04 DIAGNOSIS — K8001 Calculus of gallbladder with acute cholecystitis with obstruction: Secondary | ICD-10-CM | POA: Diagnosis not present

## 2016-01-04 DIAGNOSIS — Z9109 Other allergy status, other than to drugs and biological substances: Secondary | ICD-10-CM

## 2016-01-04 DIAGNOSIS — F32A Depression, unspecified: Secondary | ICD-10-CM

## 2016-01-04 DIAGNOSIS — K219 Gastro-esophageal reflux disease without esophagitis: Secondary | ICD-10-CM

## 2016-01-04 DIAGNOSIS — Z91048 Other nonmedicinal substance allergy status: Secondary | ICD-10-CM | POA: Diagnosis not present

## 2016-01-04 DIAGNOSIS — F439 Reaction to severe stress, unspecified: Secondary | ICD-10-CM

## 2016-01-04 LAB — GLUCOSE, POCT (MANUAL RESULT ENTRY): POC Glucose: 97 mg/dl (ref 70–99)

## 2016-01-04 MED ORDER — LEVOTHYROXINE SODIUM 75 MCG PO TABS: 75.0000 ug | ORAL_TABLET | Freq: Every day | ORAL | Status: DC

## 2016-01-04 MED ORDER — FUROSEMIDE 40 MG PO TABS: 40.0000 mg | ORAL_TABLET | Freq: Every day | ORAL | Status: DC

## 2016-01-04 MED ORDER — FLUTICASONE PROPIONATE 50 MCG/ACT NA SUSP: 2.0000 | Freq: Every day | NASAL | Status: DC

## 2016-01-04 MED ORDER — OMEPRAZOLE 40 MG PO CPDR: 40.0000 mg | DELAYED_RELEASE_CAPSULE | Freq: Every day | ORAL | Status: DC

## 2016-01-04 NOTE — Telephone Encounter (Signed)
Advised pt to stop glucotrol while taking abx.

## 2016-01-04 NOTE — Patient Instructions (Signed)
Please take your antibiotic. Do not take your Glucotrol. Try to drink fluids and eat some food. Please see me in 2 weeks. I have made a referral for you to received some counseling. I have made a referral to home health.
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
## 2016-01-04 NOTE — Telephone Encounter (Signed)
Tell her to stop her Glucotrol but continue the antibiotics they gave her.

## 2016-01-05 DIAGNOSIS — K819 Cholecystitis, unspecified: Secondary | ICD-10-CM | POA: Diagnosis not present

## 2016-01-05 DIAGNOSIS — K219 Gastro-esophageal reflux disease without esophagitis: Secondary | ICD-10-CM | POA: Diagnosis not present

## 2016-01-05 DIAGNOSIS — N189 Chronic kidney disease, unspecified: Secondary | ICD-10-CM | POA: Diagnosis not present

## 2016-01-05 DIAGNOSIS — E785 Hyperlipidemia, unspecified: Secondary | ICD-10-CM | POA: Diagnosis not present

## 2016-01-05 DIAGNOSIS — I129 Hypertensive chronic kidney disease with stage 1 through stage 4 chronic kidney disease, or unspecified chronic kidney disease: Secondary | ICD-10-CM | POA: Diagnosis not present

## 2016-01-05 DIAGNOSIS — E1122 Type 2 diabetes mellitus with diabetic chronic kidney disease: Secondary | ICD-10-CM | POA: Diagnosis not present

## 2016-01-10 DIAGNOSIS — E1122 Type 2 diabetes mellitus with diabetic chronic kidney disease: Secondary | ICD-10-CM | POA: Diagnosis not present

## 2016-01-10 DIAGNOSIS — N189 Chronic kidney disease, unspecified: Secondary | ICD-10-CM | POA: Diagnosis not present

## 2016-01-10 DIAGNOSIS — K219 Gastro-esophageal reflux disease without esophagitis: Secondary | ICD-10-CM | POA: Diagnosis not present

## 2016-01-10 DIAGNOSIS — E785 Hyperlipidemia, unspecified: Secondary | ICD-10-CM | POA: Diagnosis not present

## 2016-01-10 DIAGNOSIS — I129 Hypertensive chronic kidney disease with stage 1 through stage 4 chronic kidney disease, or unspecified chronic kidney disease: Secondary | ICD-10-CM | POA: Diagnosis not present

## 2016-01-10 DIAGNOSIS — K819 Cholecystitis, unspecified: Secondary | ICD-10-CM | POA: Diagnosis not present

## 2016-01-13 ENCOUNTER — Telehealth: Payer: Self-pay

## 2016-01-13 DIAGNOSIS — K819 Cholecystitis, unspecified: Secondary | ICD-10-CM | POA: Diagnosis not present

## 2016-01-13 DIAGNOSIS — N189 Chronic kidney disease, unspecified: Secondary | ICD-10-CM | POA: Diagnosis not present

## 2016-01-13 DIAGNOSIS — I129 Hypertensive chronic kidney disease with stage 1 through stage 4 chronic kidney disease, or unspecified chronic kidney disease: Secondary | ICD-10-CM | POA: Diagnosis not present

## 2016-01-13 DIAGNOSIS — E785 Hyperlipidemia, unspecified: Secondary | ICD-10-CM | POA: Diagnosis not present

## 2016-01-13 DIAGNOSIS — K219 Gastro-esophageal reflux disease without esophagitis: Secondary | ICD-10-CM | POA: Diagnosis not present

## 2016-01-13 DIAGNOSIS — E1122 Type 2 diabetes mellitus with diabetic chronic kidney disease: Secondary | ICD-10-CM | POA: Diagnosis not present

## 2016-01-13 NOTE — Telephone Encounter (Signed)
Pt home health nurse is calling requesting that we call in a rx for diflucan for the patient   Best number [phone]

## 2016-01-16 ENCOUNTER — Telehealth: Payer: Self-pay

## 2016-01-16 MED ORDER — FLUCONAZOLE 150 MG PO TABS: 150.0000 mg | ORAL_TABLET | Freq: Once | ORAL | Status: DC

## 2016-01-16 NOTE — Telephone Encounter (Signed)
Dr Everlene Farrier instru'd her at [place] on 2/8 to take her Abx that had been Rxd to her. Notified pt.

## 2016-01-16 NOTE — Telephone Encounter (Signed)
Sherri Burns would like to have an order to be able to go visit pt at least once a week until the end of March due to her episodes. Please call [phone]

## 2016-01-16 NOTE — Telephone Encounter (Signed)
The home health nurse called again regarding the medication, states pt is saying she is burning up down there. Please call [place]

## 2016-01-18 DIAGNOSIS — E1122 Type 2 diabetes mellitus with diabetic chronic kidney disease: Secondary | ICD-10-CM | POA: Diagnosis not present

## 2016-01-18 DIAGNOSIS — K219 Gastro-esophageal reflux disease without esophagitis: Secondary | ICD-10-CM | POA: Diagnosis not present

## 2016-01-18 DIAGNOSIS — I129 Hypertensive chronic kidney disease with stage 1 through stage 4 chronic kidney disease, or unspecified chronic kidney disease: Secondary | ICD-10-CM | POA: Diagnosis not present

## 2016-01-18 DIAGNOSIS — N189 Chronic kidney disease, unspecified: Secondary | ICD-10-CM | POA: Diagnosis not present

## 2016-01-18 DIAGNOSIS — K819 Cholecystitis, unspecified: Secondary | ICD-10-CM | POA: Diagnosis not present

## 2016-01-18 DIAGNOSIS — E785 Hyperlipidemia, unspecified: Secondary | ICD-10-CM | POA: Diagnosis not present

## 2016-01-18 NOTE — Telephone Encounter (Signed)
Sherri Burns states she really need an order in order to go visit pt. Please call [phone], is really anxious

## 2016-01-18 NOTE — Telephone Encounter (Signed)
I called [place] back and gave her VO to continue 1 x wk visits with pt through her Cert period as long as it is needed.

## 2016-01-19 NOTE — Telephone Encounter (Signed)
Please see previous message

## 2016-01-24 ENCOUNTER — Telehealth: Payer: Self-pay | Admitting: Cardiovascular Disease

## 2016-01-24 DIAGNOSIS — K8000 Calculus of gallbladder with acute cholecystitis without obstruction: Secondary | ICD-10-CM | POA: Diagnosis not present

## 2016-01-24 NOTE — Telephone Encounter (Signed)
Received records from [facility] for appointment with Dr Oval Linsey on 02/07/16.  Records given to [facility] (medical records) for Dr Blenda Mounts schedule on 02/07/16.

## 2016-01-25 ENCOUNTER — Other Ambulatory Visit: Payer: Self-pay | Admitting: General Surgery

## 2016-01-25 DIAGNOSIS — K8000 Calculus of gallbladder with acute cholecystitis without obstruction: Secondary | ICD-10-CM

## 2016-01-27 DIAGNOSIS — K219 Gastro-esophageal reflux disease without esophagitis: Secondary | ICD-10-CM | POA: Diagnosis not present

## 2016-01-27 DIAGNOSIS — K819 Cholecystitis, unspecified: Secondary | ICD-10-CM | POA: Diagnosis not present

## 2016-01-27 DIAGNOSIS — N189 Chronic kidney disease, unspecified: Secondary | ICD-10-CM | POA: Diagnosis not present

## 2016-01-27 DIAGNOSIS — E1122 Type 2 diabetes mellitus with diabetic chronic kidney disease: Secondary | ICD-10-CM | POA: Diagnosis not present

## 2016-01-27 DIAGNOSIS — E785 Hyperlipidemia, unspecified: Secondary | ICD-10-CM | POA: Diagnosis not present

## 2016-01-27 DIAGNOSIS — I129 Hypertensive chronic kidney disease with stage 1 through stage 4 chronic kidney disease, or unspecified chronic kidney disease: Secondary | ICD-10-CM | POA: Diagnosis not present

## 2016-01-28 DIAGNOSIS — E119 Type 2 diabetes mellitus without complications: Secondary | ICD-10-CM | POA: Diagnosis not present

## 2016-02-01 ENCOUNTER — Telehealth: Payer: Self-pay

## 2016-02-01 MED ORDER — AZITHROMYCIN 250 MG PO TABS: ORAL_TABLET | ORAL | Status: DC

## 2016-02-01 NOTE — Telephone Encounter (Signed)
PATIENT WOULD LIKE DR. DAUB TO KNOW THAT SHE NEEDS SOMETHING CALLED  INTO HER PHARMACY FOR A BAD COUGH AND TICKLE IN HER THROAT. HE KNOWS HER HISTORY ABOUT HER GALL BLADDER. SHE CANNOT COME INTO THE OFFICE AND SHE DOES NOT HAVE ANYONE TO BRING HER EITHER. BEST PHONE [phone] (CELL)  [place].  [place]

## 2016-02-01 NOTE — Telephone Encounter (Signed)
Please call her in a Z-Pak and advised her to take Mucinex.

## 2016-02-01 NOTE — Telephone Encounter (Signed)
Spoke with pt, advised message. 

## 2016-02-02 ENCOUNTER — Ambulatory Visit
Admission: RE | Admit: 2016-02-02 | Discharge: 2016-02-02 | Disposition: A | Discharge status: Home / Self Care | Payer: Medicare Other | Source: Ambulatory Visit | Attending: General Surgery | Admitting: General Surgery

## 2016-02-02 DIAGNOSIS — K8000 Calculus of gallbladder with acute cholecystitis without obstruction: Secondary | ICD-10-CM

## 2016-02-02 DIAGNOSIS — K5730 Diverticulosis of large intestine without perforation or abscess without bleeding: Secondary | ICD-10-CM | POA: Diagnosis not present

## 2016-02-02 DIAGNOSIS — K810 Acute cholecystitis: Secondary | ICD-10-CM | POA: Diagnosis not present

## 2016-02-02 MED ORDER — IOPAMIDOL (ISOVUE-300) INJECTION 61%
100.0000 mL | Freq: Once | INTRAVENOUS | Status: AC | PRN
Start: 2016-02-02 — End: 2016-02-02
  Administered 2016-02-02: 100 mL via INTRAVENOUS

## 2016-02-02 NOTE — Progress Notes (Signed)
Patient ID: [name], female   DOB: 08-07-1936, 80 y.o.   MRN: [id number]   Referring Physician(s): Toth,Paul III  Chief Complaint: The patient is seen in follow up today s/p percutaneous cholecystostomy on 12/29/15  History of present illness:  Mrs. Mccommon is a 80 year old female, patient of Dr. Autumn Messing, with history of acute cholecystitis who underwent percutaneous cholecystostomy on 12/29/15 by Dr. Laurence Ferrari. She presents today for routine outpatient follow-up CT along with cholecystostomy tube injection. Prior cultures of bile revealed Klebsiella and patient was discharged home on 2/7 with Cipro. She has done fairly well since discharge with only current complaint of some nasal/ sinus congestion which she was recently prescribed Z-Pak and Mucinex. She currently denies fever, chest pain, abdominal/back pain, nausea, vomiting or abnormal bleeding. She is flushing her drain twice a day. Output averages between 75-100 mL of bile per day. She is scheduled to follow up with Dr. Marlou Starks on 02/21/16 to discuss cholecystectomy. She will need cardiac clearance preop.  Past Medical History  Diagnosis Date  . Hypertension   . GERD (gastroesophageal reflux disease)     + hpylori  . Hyperlipidemia   . Anxiety   . Cancer ([place]) 15 yrs ago    ho renal s/p nephrectomy  . Diabetes mellitus   . Elevated TSH   . Elevated serum creatinine   . Ischemic colitis ([place])   . Renal insufficiency   . Colon polyps 11/2008    tubular adenoma  . Esophageal problem     esophageal dilation    Past Surgical History  Procedure Laterality Date  . Nephrectomy      R  . Partial hysterectomy  1978  . Colonoscopy    . Esophageal dilation  2016    Allergies: Ciprocin-fluocin-procin; Clonidine derivatives; Codeine; Crestor; Penicillins; Simvastatin; and Tessalon perles  Medications: Prior to Admission medications   Medication Sig Start Date End Date Taking? Authorizing Provider  aspirin 81 MG tablet Take  81 mg by mouth daily.    Historical Provider, MD  azithromycin (ZITHROMAX) 250 MG tablet Take 2 pills today then one a day for 4 additional days 02/01/16   [name], MD  Cholecalciferol (VITAMIN D-3 PO) Take 1 tablet by mouth daily.    Historical Provider, MD  ciprofloxacin (CIPRO) 500 MG tablet Take 1 tablet (500 mg total) by mouth 2 (two) times daily. Patient not taking: Reported on 01/04/2016 01/03/16   Theodis Blaze, MD  clonazePAM (KLONOPIN) 1 MG tablet Take 1/2 tab in AM and 1/2 tab in afternoon and 1 at [facility] Patient taking differently: Take 0.5-1 mg by mouth 3 (three) times daily as needed for anxiety (sleep). Take 1/2 tab in AM and 1/2 tab in afternoon and 1 at [facility] 11/29/15   [name], MD  diltiazem (CARTIA XT) 240 MG 24 hr capsule Take 1 capsule (240 mg total) by mouth daily. 11/29/15   [name], MD  fluconazole (DIFLUCAN) 150 MG tablet Take 1 tablet (150 mg total) by mouth once. May repeat in 1 week if needed. 01/16/16   [name], MD  fluticasone (FLONASE) 50 MCG/ACT nasal spray Place 2 sprays into both nostrils daily. 01/04/16   [name], MD  furosemide (LASIX) 40 MG tablet Take 1 tablet (40 mg total) by mouth daily. 01/04/16   [name], MD  glipiZIDE (GLUCOTROL XL) 10 MG 24 hr tablet TAKE 1 TABLET BY MOUTH TWICE DAILY 11/29/15   [name], MD  levothyroxine (SYNTHROID, LEVOTHROID) 75 MCG tablet Take 1 tablet (75 mcg total) by mouth daily before breakfast. 01/04/16   [name], MD  losartan (COZAAR) 25 MG tablet Take 1 tablet daily Patient not taking: Reported on 01/04/2016 07/28/15   [name], MD  metoprolol (LOPRESSOR) 100 MG tablet TAKE 1 TABLET BY MOUTH TWICE DAILY 11/29/15   [name], MD  miconazole (MICATIN) 2 % cream Apply 1 application topically 2 (two) times daily. Patient not taking: Reported on 01/04/2016 11/09/14   Shawnee Knapp, MD  mupirocin ointment (BACTROBAN) 2 % APPLY TO THE AREAS AROUND THE BASE OF THE GREAT TOENAIL TWICE DAILY Patient taking  differently: APPLY TO THE AREAS AROUND THE BASE OF THE GREAT TOENAIL twice daily as needed for breakouts on feet/toes 03/02/15   [name], MD  omeprazole (PRILOSEC) 40 MG capsule Take 1 capsule (40 mg total) by mouth daily. 01/04/16   [name], MD  ondansetron (ZOFRAN) 4 MG tablet Take 1 tablet (4 mg total) by mouth every 6 (six) hours as needed for nausea. Patient not taking: Reported on 01/04/2016 01/03/16   Theodis Blaze, MD  oxyCODONE (OXY IR/ROXICODONE) 5 MG immediate release tablet Take 1 tablet (5 mg total) by mouth every 4 (four) hours as needed for moderate pain. Patient not taking: Reported on 01/04/2016 01/03/16   Theodis Blaze, MD  rosuvastatin (CRESTOR) 5 MG tablet Take 1 tablet (5 mg total) by mouth daily. Patient not taking: Reported on 01/04/2016 04/28/15   [name], MD  vitamin E 100 UNIT capsule Take 100 Units by mouth daily.    Historical Provider, MD     Family History  Problem Relation Age of Onset  . Cancer Mother     colon, kidney cancer  . Cancer Father     throat cancer    Social History   Social History  . Marital Status: Widowed    Spouse Name: N/A  . Number of Children: N/A  . Years of Education: N/A   Social History Main Topics  . Smoking status: Current Some Day Smoker -- 0.50 packs/day for 55 years    Types: Cigarettes  . Smokeless tobacco: Never Used     Comment: cut back amount still  trying to quit  . Alcohol Use: No  . Drug Use: No  . Sexual Activity: Yes   Other Topics Concern  . Not on file   Social History Narrative   Loss of son.     Vital Signs: BP 138/79 mmHg  Pulse 68  Temp(Src) 97 F (36.1 C) (Oral)  SpO2 98%  Physical Exam patient awake, alert. Cholecystostomy drain is intact, insertion site nontender, dressing dry. Approximately 25 mL of dark green bile in bag  Imaging: Ct Abdomen Pelvis W Contrast  02/02/2016  CLINICAL DATA:  Post cholecystostomy tube placement EXAM: CT ABDOMEN AND PELVIS WITH CONTRAST TECHNIQUE:  Multidetector CT imaging of the abdomen and pelvis was performed using the standard protocol following bolus administration of intravenous contrast. CONTRAST:  156mL ISOVUE-300 IOPAMIDOL (ISOVUE-300) INJECTION 61% COMPARISON:  CT abdomen pelvis - 01/18/2016; ultrasound fluoroscopic guided cholecystostomy tube placement - 01/18/2016 FINDINGS: Normal hepatic contour. There is mild diffuse decreased attenuation of the hepatic parenchyma on this postcontrast examination suggestive of hepatic steatosis. No discrete hepatic lesions. A cholecystostomy tube is appropriately positioned within the fundus of an otherwise normal-appearing gallbladder. No definitive gallbladder wall thickening. No radiopaque gallstones. No ascites. Stable sequela of prior right sided  nephrectomy without residual tissue within the right nephrectomy bed. There is homogeneous enhancement of the remaining left kidney. No definite renal stones. No discrete renal lesions. No left-sided urinary obstruction or perinephric stranding. Normal appearance of the bilateral adrenal glands, pancreas and spleen. Rather extensive colonic diverticulosis without evidence of diverticulitis. Note is again made of a approximately 3.8 x 1.9 cm diverticulum within the horizontal segment of the duodenum (image 32, series 3). The cecum is noted to be located with the midline of the pelvis. The bowel is otherwise normal in course and caliber without wall thickening. Normal appearance of the terminal ileum and appendix. No pneumoperitoneum, pneumatosis or portal venous gas. Scattered mixed calcified and noncalcified atherosclerotic plaque within a tortuous but normal caliber abdominal aorta. The major branch vessels of the abdominal aorta appear widely patent on this non CTA examination. No bulky retroperitoneal, mesenteric, pelvic or inguinal lymphadenopathy. Post hysterectomy. No discrete adnexal lesion. Normal appearance of the urinary bladder given degree distention. No  free fluid in the pelvic cul-de-sac. Limited visualization of lower thorax demonstrates minimal dependent subpleural ground-glass atelectasis. No focal airspace opacities. No pleural effusion. Normal heart size. No pericardial effusion. No acute or aggressive osseous abnormalities. Moderate bilateral facet degenerative change of the lower lumbar spine with mild (approximately 4 mm) of grade 1 anterolisthesis of L4 upon L5. Stigmata DISH within the caudal aspect of the thoracic spine. Tiny mesenteric fat containing periumbilical hernia. Regional soft tissues appear normal. IMPRESSION: 1. Appropriately positioned cholecystostomy tube with an coiled and locked within the fundus of the gallbladder. Otherwise, normal appearance of the gallbladder. No radiopaque gallstones. 2. Extensive colonic diverticulosis without evidence of diverticulitis. 3. Incidentally noted approximately 3.8 cm duodenal diverticulum. 4. Stable sequela of prior right-sided nephrectomy. Electronically Signed   By: Sandi Mariscal M.D.   On: 02/02/2016 10:03    Labs:  CBC:  Recent Labs  12/31/15 0521 01/01/16 0524 01/02/16 0538 01/03/16 0455  WBC 9.1 9.0 8.8 7.9  HGB 11.6* 11.0* 11.2* 11.6*  HCT 35.9* 34.3* 34.7* 35.8*  PLT 175 183 211 232    COAGS:  Recent Labs  12/29/15 1731  INR 0.98  APTT 25    BMP:  Recent Labs  12/31/15 0521 01/01/16 0524 01/02/16 0538 01/03/16 0455  NA 141 139 140 141  K 4.1 3.8 3.7 3.9  CL 110 108 107 106  CO2 25 24 25 27   GLUCOSE 92 123* 108* 120*  BUN 11 9 7 9   CALCIUM 9.1 8.9 8.7* 8.8*  CREATININE 1.27* 1.11* 1.09* 1.16*  GFRNONAA 40* 47* 48* 44*  GFRAA 46* 54* 55* 51*    LIVER FUNCTION TESTS:  Recent Labs  04/27/15 1044 12/29/15 0821 12/30/15 0448 01/01/16 0524  BILITOT 0.7 0.7 0.7 0.7  AST 16 18 19  12*  ALT 16 16 14  11*  ALKPHOS 71 70 55 50  PROT 7.0 7.9 6.8 6.7  ALBUMIN 4.0 4.3 3.7 3.2*    Assessment: Patient with history of acute cholecystitis, status post  percutaneous cholecystostomy on 12/29/15. Prior bile cultures revealed Klebsiella- previously discharged home on ciprofloxacin. Patient currently stable. CT and cholecystostomy tube injection today reveal appropriate placement of catheter with patent cystic duct. Drain was capped. Patient was given instructions to place drain back to gravity bag if she experiences increasing abdominal pain. She is scheduled for follow-up with Dr. Marlou Starks on 02/21/16. She will require cardiac clearance prior to cholecystectomy.   Signed: D. Rowe Robert 02/02/2016, 10:30 AM   Please refer to Dr. Pascal Lux  attestation of this note for management and plan.

## 2016-02-02 NOTE — Progress Notes (Signed)
Patient ID: Sherri Burns, female   DOB: Jun 23, 1936, 80 y.o.   MRN: [id number]  Unfortunately the patient failed her trial of cholecystostomy tube capping, returning to the interventional radiology drain clinic this afternoon with recurrent right upper quadrant abdominal pain, nausea and vomiting.  As such, the cholecystostomy tube was re-connected to a gravity bag yielding the brisk output of normal bilious material.  The patient was instructed to maintain the cholecystostomy tube to a gravity bag until she undergoes definitive cholecystectomy.  She was also instructed to continue flushing the cholecystostomy tube twice a day as she has done previously.  The patient demonstrated excellent understanding of this discussion.  Ronny Bacon, MD Pager #: [phone]

## 2016-02-03 DIAGNOSIS — N189 Chronic kidney disease, unspecified: Secondary | ICD-10-CM | POA: Diagnosis not present

## 2016-02-03 DIAGNOSIS — E785 Hyperlipidemia, unspecified: Secondary | ICD-10-CM | POA: Diagnosis not present

## 2016-02-03 DIAGNOSIS — K219 Gastro-esophageal reflux disease without esophagitis: Secondary | ICD-10-CM | POA: Diagnosis not present

## 2016-02-03 DIAGNOSIS — I129 Hypertensive chronic kidney disease with stage 1 through stage 4 chronic kidney disease, or unspecified chronic kidney disease: Secondary | ICD-10-CM | POA: Diagnosis not present

## 2016-02-03 DIAGNOSIS — E1122 Type 2 diabetes mellitus with diabetic chronic kidney disease: Secondary | ICD-10-CM | POA: Diagnosis not present

## 2016-02-03 DIAGNOSIS — K819 Cholecystitis, unspecified: Secondary | ICD-10-CM | POA: Diagnosis not present

## 2016-02-06 NOTE — Progress Notes (Signed)
Cardiology Office Note   Date:  02/07/2016   ID:  Sherri Burns, DOB 1936-01-13, MRN [id number]  PCP:  Jenny Reichmann, MD  Cardiologist:   Sharol Harness, MD   Chief Complaint  Patient presents with  . New Evaluation    Cardiac Clearance--gallbladder surgery per Autumn Messing, MD [facility])  pt c/o headache, anxiety; dizziness when her BP goes up; swelling in bilateral legs/feet/ankles--LASIX 40 daily helps      History of Present Illness: Sherri Burns is a 80 y.o. female with hypertension, diabetes mellitus type 2, CKD 3, renal cancer s/ R nephrectomy, and hypothyroidism who presents for pre-surgical risk assessment prior to gallbladder surgery. Sherri Burns was hospitalized 2/2-2/7 for acute cholecystitis.  She first noted RUQ abdominal pain in July.  The symptoms progressed and she had to go to the hospital.  She was treated with IV antibiotics and a percutaneous drain.  She was discharged on ciprofloxacin with plans for cholecystectomy after the infection cleared.  At her follow up appointment with Dr. Everlene Farrier on 01/04/16 she reported that she had not been taking the antibiotic.  Sherri Burns had a trial of clamping the drain but she tried to eat but developed severe emesis so the tube was re-opened on 3/9.  Since then she has been doing well.  Sherri Burns has home health and her daughter has been helping as well.    Sherri Burns denies chest pain or shortness of breath.  She is very active around her home but does not get any formal exercise.  She is limited by leg pain and diabetic neuropathy.  She enjoys working with flowers and being on her land.  She denies lower extremity edema, orthopnea or palpitations. She has not noted lightheadedness, dizziness or palpitations.   Sherri Burns previously smoked heavily.  She now smokes 3-4 cigarettes daily.  She has been able to quit in the past with a cold [place] approach.  She states that her "nerves are bad," and smoking calms  her nerves.    Past Medical History  Diagnosis Date  . Hypertension   . GERD (gastroesophageal reflux disease)     + hpylori  . Hyperlipidemia   . Anxiety   . Cancer ([place]) 15 yrs ago    ho renal s/p nephrectomy  . Diabetes mellitus   . Elevated TSH   . Elevated serum creatinine   . Ischemic colitis ([place])   . Renal insufficiency   . Colon polyps 11/2008    tubular adenoma  . Esophageal problem     esophageal dilation  . Abnormal EKG 02/07/2016    Inferolateral T wave inversion and ST depression.    Past Surgical History  Procedure Laterality Date  . Nephrectomy      R  . Partial hysterectomy  1978  . Colonoscopy    . Esophageal dilation  2016     Current Outpatient Prescriptions  Medication Sig Dispense Refill  . aspirin 81 MG tablet Take 81 mg by mouth daily.    . Cholecalciferol (VITAMIN D-3 PO) Take 1 tablet by mouth daily.    . clonazePAM (KLONOPIN) 1 MG tablet Take 1/2 tab in AM and 1/2 tab in afternoon and 1 at HS (Patient taking differently: Take 0.5-1 mg by mouth 3 (three) times daily as needed for anxiety (sleep). Take 1/2 tab in AM and 1/2 tab in afternoon and 1 at HS) 60 tablet 3  . diltiazem (CARTIA XT) 240 MG 24  hr capsule Take 1 capsule (240 mg total) by mouth daily. 90 capsule 3  . fluticasone (FLONASE) 50 MCG/ACT nasal spray Place 2 sprays into both nostrils daily. 16 g 10  . furosemide (LASIX) 40 MG tablet Take 1 tablet (40 mg total) by mouth daily. 90 tablet 3  . glipiZIDE (GLUCOTROL XL) 10 MG 24 hr tablet TAKE 1 TABLET BY MOUTH TWICE DAILY 180 tablet 0  . levothyroxine (SYNTHROID, LEVOTHROID) 75 MCG tablet Take 1 tablet (75 mcg total) by mouth daily before breakfast. 90 tablet 3  . losartan (COZAAR) 25 MG tablet Take 1 tablet daily 30 tablet 11  . metoprolol (LOPRESSOR) 100 MG tablet TAKE 1 TABLET BY MOUTH TWICE DAILY 60 tablet 3  . omeprazole (PRILOSEC) 40 MG capsule Take 1 capsule (40 mg total) by mouth daily. 30 capsule 11  . vitamin E 100 UNIT  capsule Take 100 Units by mouth daily.     No current facility-administered medications for this visit.    Allergies:   Ciprocin-fluocin-procin; Clonidine derivatives; Codeine; Crestor; Penicillins; Simvastatin; and Tessalon perles    Social History:  The patient  reports that she has been smoking Cigarettes.  She has a 27.5 pack-year smoking history. She has never used smokeless tobacco. She reports that she does not drink alcohol or use illicit drugs.   Family History:  The patient's family history includes Cancer in her father, mother, and sister; Heart attack in her brother.    ROS:  Please see the history of present illness.   Otherwise, review of systems are positive for hair falling out, sores on her feet.   All other systems are reviewed and negative.    PHYSICAL EXAM: VS:  BP 134/92 mmHg  Pulse 61  Ht 5' 6.5" (1.689 m)  Wt 76.839 kg (169 lb 6.4 oz)  BMI 26.94 kg/m2 , BMI Body mass index is 26.94 kg/(m^2). GENERAL:  Well appearing HEENT:  Pupils equal round and reactive, fundi not visualized, oral mucosa unremarkable NECK:  No jugular venous distention, waveform within normal limits, carotid upstroke brisk and symmetric, no bruits, no thyromegaly LYMPHATICS:  No cervical adenopathy LUNGS:  Clear to auscultation bilaterally HEART:  RRR.  PMI not displaced or sustained,S1 and S2 within normal limits, no S3, no S4, no clicks, no rubs, no murmurs ABD:  Flat, positive bowel sounds normal in frequency in pitch, no bruits, no rebound, no guarding, no midline pulsatile mass, no hepatomegaly, no splenomegaly EXT:  2 plus pulses throughout, no edema, no cyanosis no clubbing SKIN:  No rashes no nodules NEURO:  Cranial nerves II through XII grossly intact, motor grossly intact throughout PSYCH:  Cognitively intact, oriented to person place and time   EKG:  EKG is ordered today. The ekg ordered today demonstrates sinus rhythm rate 61 bpm.  Inferolateral ST depression and T wave  inversion concerning for ischemia.   Recent Labs: 12/30/2015: TSH 0.550 01/01/2016: ALT 11* 01/03/2016: BUN 9; Creatinine, Ser 1.16*; Hemoglobin 11.6*; Platelets 232; Potassium 3.9; Sodium 141    Lipid Panel    Component Value Date/Time   CHOL 238* 07/28/2015 1021   TRIG 291* 07/28/2015 1021   HDL 38* 07/28/2015 1021   CHOLHDL 6.3* 07/28/2015 1021   VLDL 58* 07/28/2015 1021   LDLCALC 142* 07/28/2015 1021      Wt Readings from Last 3 Encounters:  02/07/16 76.839 kg (169 lb 6.4 oz)  01/04/16 80.74 kg (178 lb)  01/03/16 81.7 kg (180 lb 1.9 oz)  ASSESSMENT AND PLAN:  # Pre-surgical risk assessment:  Sherri Burns does not get much formal exercise and does not climb stairs or go for extended walks.  Therefore, it is difficult to assess her exercise capacity or whether she has symptoms.  Also, she has significant ST depression and T wave inversions on EKG that are concerning for ischemia.  She is asymptomatic, but she is diabetic and has diabetic neuropathy. Therefore, it is possible that she has silent ischemia.  We will obtain a Lexiscan Cardiolite to evaluate for ischemia.   # Hypertension: Blood pressure is above goal today.  On repeat it was 155/88.  She thinks this is because she was angry with the woman who gave her a ride.  She reports that it is well-controlled at home.  She will check her BP at home and call if it is >140/90.  # Hyperlipidemia: Sherri Burns is a diabetic and her lipids are elevated.  She should be on a statin but is not interested due to intolerance to Crestor and simvastatin.  We will continue this discussion at future appointments.   # Tobacco abuse: She is not interested in quitting at this time.  We discussed smoking cessation for 5 minutes.    Current medicines are reviewed at length with the patient today.  The patient does not have concerns regarding medicines.  The following changes have been made:  no change  Labs/ tests ordered today include:    Orders Placed This Encounter  Procedures  . Myocardial Perfusion Imaging  . EKG 12-Lead     Disposition:   FU with  Percival Glasheen C. Oval Linsey, MD, [facility] in 6 months.    This note was written with the assistance of speech recognition software.  Please excuse any transcriptional errors.  Signed, Cyniah Gossard C. Oval Linsey, MD, [facility]  02/07/2016 1:36 PM    [facility]

## 2016-02-07 ENCOUNTER — Ambulatory Visit (INDEPENDENT_AMBULATORY_CARE_PROVIDER_SITE_OTHER): Payer: Medicare Other | Admitting: Cardiovascular Disease

## 2016-02-07 ENCOUNTER — Encounter: Payer: Self-pay | Admitting: Cardiovascular Disease

## 2016-02-07 VITALS — BP 134/92 | HR 61 | Ht 66.5 in | Wt 169.4 lb

## 2016-02-07 DIAGNOSIS — Z01818 Encounter for other preprocedural examination: Secondary | ICD-10-CM | POA: Diagnosis not present

## 2016-02-07 DIAGNOSIS — F17210 Nicotine dependence, cigarettes, uncomplicated: Secondary | ICD-10-CM | POA: Diagnosis not present

## 2016-02-07 DIAGNOSIS — E785 Hyperlipidemia, unspecified: Secondary | ICD-10-CM

## 2016-02-07 DIAGNOSIS — Z720 Tobacco use: Secondary | ICD-10-CM

## 2016-02-07 DIAGNOSIS — I10 Essential (primary) hypertension: Secondary | ICD-10-CM

## 2016-02-07 DIAGNOSIS — R9431 Abnormal electrocardiogram [ECG] [EKG]: Secondary | ICD-10-CM

## 2016-02-07 HISTORY — DX: Abnormal electrocardiogram (ECG) (EKG): R94.31

## 2016-02-07 NOTE — Patient Instructions (Signed)
Medication Instructions:  Your physician recommends that you continue on your current medications as directed. Please refer to the Current Medication list given to you today.  Labwork: NONE  Testing/Procedures: Your physician has requested that you have a lexiscan myoview. For further information please visit [email or web address]. Please follow instruction sheet, as given.  Follow-Up: Your physician wants you to follow-up in: [place] will receive a reminder letter in the mail two months in advance. If you don't receive a letter, please call our office to schedule the follow-up appointment.  Any Other Special Instructions Will Be Listed Below (If Applicable). MONITOR YOUR BLOOD PRESSURE AT HOME AND CALL IF 140/90 OR GREATER  If you need a refill on your cardiac medications before your next appointment, please call your pharmacy.

## 2016-02-10 DIAGNOSIS — I129 Hypertensive chronic kidney disease with stage 1 through stage 4 chronic kidney disease, or unspecified chronic kidney disease: Secondary | ICD-10-CM | POA: Diagnosis not present

## 2016-02-10 DIAGNOSIS — K219 Gastro-esophageal reflux disease without esophagitis: Secondary | ICD-10-CM | POA: Diagnosis not present

## 2016-02-10 DIAGNOSIS — E785 Hyperlipidemia, unspecified: Secondary | ICD-10-CM | POA: Diagnosis not present

## 2016-02-10 DIAGNOSIS — K819 Cholecystitis, unspecified: Secondary | ICD-10-CM | POA: Diagnosis not present

## 2016-02-10 DIAGNOSIS — N189 Chronic kidney disease, unspecified: Secondary | ICD-10-CM | POA: Diagnosis not present

## 2016-02-10 DIAGNOSIS — E1122 Type 2 diabetes mellitus with diabetic chronic kidney disease: Secondary | ICD-10-CM | POA: Diagnosis not present

## 2016-02-15 ENCOUNTER — Telehealth (HOSPITAL_COMMUNITY): Payer: Self-pay | Admitting: *Deleted

## 2016-02-15 NOTE — Telephone Encounter (Signed)
Left message on voicemail per DPR in reference to upcoming appointment scheduled on 02/20/16 with detailed instructions given per Myocardial Perfusion Study Information Sheet for the test. LM to arrive 15 minutes early, and that it is imperative to arrive on time for appointment to keep from having the test rescheduled. If you need to cancel or reschedule your appointment, please call the office within 24 hours of your appointment. Failure to do so may result in a cancellation of your appointment, and a $50 no show fee. Phone number given for call back for any questions. Hubbard Robinson, RN

## 2016-02-17 DIAGNOSIS — K819 Cholecystitis, unspecified: Secondary | ICD-10-CM | POA: Diagnosis not present

## 2016-02-17 DIAGNOSIS — K219 Gastro-esophageal reflux disease without esophagitis: Secondary | ICD-10-CM | POA: Diagnosis not present

## 2016-02-17 DIAGNOSIS — I129 Hypertensive chronic kidney disease with stage 1 through stage 4 chronic kidney disease, or unspecified chronic kidney disease: Secondary | ICD-10-CM | POA: Diagnosis not present

## 2016-02-17 DIAGNOSIS — E785 Hyperlipidemia, unspecified: Secondary | ICD-10-CM | POA: Diagnosis not present

## 2016-02-17 DIAGNOSIS — E1122 Type 2 diabetes mellitus with diabetic chronic kidney disease: Secondary | ICD-10-CM | POA: Diagnosis not present

## 2016-02-17 DIAGNOSIS — N189 Chronic kidney disease, unspecified: Secondary | ICD-10-CM | POA: Diagnosis not present

## 2016-02-20 ENCOUNTER — Ambulatory Visit (HOSPITAL_COMMUNITY): Payer: Medicare Other | Attending: Cardiology

## 2016-02-20 DIAGNOSIS — Z01818 Encounter for other preprocedural examination: Secondary | ICD-10-CM

## 2016-02-20 DIAGNOSIS — R9439 Abnormal result of other cardiovascular function study: Secondary | ICD-10-CM | POA: Diagnosis not present

## 2016-02-20 DIAGNOSIS — I10 Essential (primary) hypertension: Secondary | ICD-10-CM | POA: Insufficient documentation

## 2016-02-20 DIAGNOSIS — R42 Dizziness and giddiness: Secondary | ICD-10-CM | POA: Insufficient documentation

## 2016-02-20 DIAGNOSIS — E119 Type 2 diabetes mellitus without complications: Secondary | ICD-10-CM | POA: Diagnosis not present

## 2016-02-20 LAB — MYOCARDIAL PERFUSION IMAGING
CHL CUP NUCLEAR SDS: 3
CHL CUP NUCLEAR SRS: 2
LHR: 0.31
LV sys vol: 30 mL
LVDIAVOL: 80 mL (ref 46–106)
Peak HR: 66 {beats}/min
Rest HR: 56 {beats}/min
SSS: 5
TID: 1.15

## 2016-02-20 MED ORDER — TECHNETIUM TC 99M SESTAMIBI GENERIC - CARDIOLITE
32.8000 | Freq: Once | INTRAVENOUS | Status: AC | PRN
  Administered 2016-02-20: 32.8 via INTRAVENOUS

## 2016-02-20 MED ORDER — REGADENOSON 0.4 MG/5ML IV SOLN
0.4000 mg | Freq: Once | INTRAVENOUS | Status: AC
  Administered 2016-02-20: 0.4 mg via INTRAVENOUS

## 2016-02-20 MED ORDER — TECHNETIUM TC 99M SESTAMIBI GENERIC - CARDIOLITE
11.0000 | Freq: Once | INTRAVENOUS | Status: AC | PRN
  Administered 2016-02-20: 11 via INTRAVENOUS

## 2016-02-21 ENCOUNTER — Other Ambulatory Visit: Payer: Self-pay | Admitting: General Surgery

## 2016-02-21 DIAGNOSIS — K8000 Calculus of gallbladder with acute cholecystitis without obstruction: Secondary | ICD-10-CM | POA: Diagnosis not present

## 2016-02-22 DIAGNOSIS — N189 Chronic kidney disease, unspecified: Secondary | ICD-10-CM | POA: Diagnosis not present

## 2016-02-22 DIAGNOSIS — E785 Hyperlipidemia, unspecified: Secondary | ICD-10-CM | POA: Diagnosis not present

## 2016-02-22 DIAGNOSIS — K819 Cholecystitis, unspecified: Secondary | ICD-10-CM | POA: Diagnosis not present

## 2016-02-22 DIAGNOSIS — K219 Gastro-esophageal reflux disease without esophagitis: Secondary | ICD-10-CM | POA: Diagnosis not present

## 2016-02-22 DIAGNOSIS — I129 Hypertensive chronic kidney disease with stage 1 through stage 4 chronic kidney disease, or unspecified chronic kidney disease: Secondary | ICD-10-CM | POA: Diagnosis not present

## 2016-02-22 DIAGNOSIS — E1122 Type 2 diabetes mellitus with diabetic chronic kidney disease: Secondary | ICD-10-CM | POA: Diagnosis not present

## 2016-02-23 DIAGNOSIS — K819 Cholecystitis, unspecified: Secondary | ICD-10-CM | POA: Diagnosis not present

## 2016-02-23 DIAGNOSIS — I129 Hypertensive chronic kidney disease with stage 1 through stage 4 chronic kidney disease, or unspecified chronic kidney disease: Secondary | ICD-10-CM | POA: Diagnosis not present

## 2016-02-23 DIAGNOSIS — N189 Chronic kidney disease, unspecified: Secondary | ICD-10-CM | POA: Diagnosis not present

## 2016-02-23 DIAGNOSIS — E1122 Type 2 diabetes mellitus with diabetic chronic kidney disease: Secondary | ICD-10-CM | POA: Diagnosis not present

## 2016-02-29 DIAGNOSIS — N189 Chronic kidney disease, unspecified: Secondary | ICD-10-CM | POA: Diagnosis not present

## 2016-02-29 DIAGNOSIS — E1122 Type 2 diabetes mellitus with diabetic chronic kidney disease: Secondary | ICD-10-CM | POA: Diagnosis not present

## 2016-02-29 DIAGNOSIS — K819 Cholecystitis, unspecified: Secondary | ICD-10-CM | POA: Diagnosis not present

## 2016-02-29 DIAGNOSIS — E785 Hyperlipidemia, unspecified: Secondary | ICD-10-CM | POA: Diagnosis not present

## 2016-02-29 DIAGNOSIS — I129 Hypertensive chronic kidney disease with stage 1 through stage 4 chronic kidney disease, or unspecified chronic kidney disease: Secondary | ICD-10-CM | POA: Diagnosis not present

## 2016-02-29 DIAGNOSIS — K219 Gastro-esophageal reflux disease without esophagitis: Secondary | ICD-10-CM | POA: Diagnosis not present

## 2016-03-09 DIAGNOSIS — I129 Hypertensive chronic kidney disease with stage 1 through stage 4 chronic kidney disease, or unspecified chronic kidney disease: Secondary | ICD-10-CM | POA: Diagnosis not present

## 2016-03-09 DIAGNOSIS — N189 Chronic kidney disease, unspecified: Secondary | ICD-10-CM | POA: Diagnosis not present

## 2016-03-09 DIAGNOSIS — E785 Hyperlipidemia, unspecified: Secondary | ICD-10-CM | POA: Diagnosis not present

## 2016-03-09 DIAGNOSIS — K819 Cholecystitis, unspecified: Secondary | ICD-10-CM | POA: Diagnosis not present

## 2016-03-09 DIAGNOSIS — E1122 Type 2 diabetes mellitus with diabetic chronic kidney disease: Secondary | ICD-10-CM | POA: Diagnosis not present

## 2016-03-09 DIAGNOSIS — K219 Gastro-esophageal reflux disease without esophagitis: Secondary | ICD-10-CM | POA: Diagnosis not present

## 2016-03-16 DIAGNOSIS — I129 Hypertensive chronic kidney disease with stage 1 through stage 4 chronic kidney disease, or unspecified chronic kidney disease: Secondary | ICD-10-CM | POA: Diagnosis not present

## 2016-03-16 DIAGNOSIS — E785 Hyperlipidemia, unspecified: Secondary | ICD-10-CM | POA: Diagnosis not present

## 2016-03-16 DIAGNOSIS — K219 Gastro-esophageal reflux disease without esophagitis: Secondary | ICD-10-CM | POA: Diagnosis not present

## 2016-03-16 DIAGNOSIS — K819 Cholecystitis, unspecified: Secondary | ICD-10-CM | POA: Diagnosis not present

## 2016-03-16 DIAGNOSIS — E1122 Type 2 diabetes mellitus with diabetic chronic kidney disease: Secondary | ICD-10-CM | POA: Diagnosis not present

## 2016-03-16 DIAGNOSIS — N189 Chronic kidney disease, unspecified: Secondary | ICD-10-CM | POA: Diagnosis not present

## 2016-03-22 ENCOUNTER — Other Ambulatory Visit: Payer: Self-pay | Admitting: Emergency Medicine

## 2016-03-23 DIAGNOSIS — E1122 Type 2 diabetes mellitus with diabetic chronic kidney disease: Secondary | ICD-10-CM | POA: Diagnosis not present

## 2016-03-23 DIAGNOSIS — N189 Chronic kidney disease, unspecified: Secondary | ICD-10-CM | POA: Diagnosis not present

## 2016-03-23 DIAGNOSIS — K819 Cholecystitis, unspecified: Secondary | ICD-10-CM | POA: Diagnosis not present

## 2016-03-23 DIAGNOSIS — I129 Hypertensive chronic kidney disease with stage 1 through stage 4 chronic kidney disease, or unspecified chronic kidney disease: Secondary | ICD-10-CM | POA: Diagnosis not present

## 2016-03-23 DIAGNOSIS — K219 Gastro-esophageal reflux disease without esophagitis: Secondary | ICD-10-CM | POA: Diagnosis not present

## 2016-03-23 DIAGNOSIS — E785 Hyperlipidemia, unspecified: Secondary | ICD-10-CM | POA: Diagnosis not present

## 2016-03-24 NOTE — Telephone Encounter (Signed)
What is plan for RTC follow up for diabetic medication

## 2016-03-27 ENCOUNTER — Encounter (HOSPITAL_COMMUNITY): Payer: Self-pay

## 2016-03-27 ENCOUNTER — Ambulatory Visit: Payer: Medicare Other | Admitting: Emergency Medicine

## 2016-03-27 ENCOUNTER — Encounter (HOSPITAL_COMMUNITY)
Admission: RE | Admit: 2016-03-27 | Discharge: 2016-03-27 | Disposition: A | Discharge status: Home / Self Care | Payer: Medicare Other | Source: Ambulatory Visit | Attending: General Surgery | Admitting: General Surgery

## 2016-03-27 DIAGNOSIS — N289 Disorder of kidney and ureter, unspecified: Secondary | ICD-10-CM | POA: Insufficient documentation

## 2016-03-27 DIAGNOSIS — I10 Essential (primary) hypertension: Secondary | ICD-10-CM | POA: Diagnosis not present

## 2016-03-27 DIAGNOSIS — Z79899 Other long term (current) drug therapy: Secondary | ICD-10-CM | POA: Insufficient documentation

## 2016-03-27 DIAGNOSIS — K219 Gastro-esophageal reflux disease without esophagitis: Secondary | ICD-10-CM | POA: Insufficient documentation

## 2016-03-27 DIAGNOSIS — Z01812 Encounter for preprocedural laboratory examination: Secondary | ICD-10-CM | POA: Insufficient documentation

## 2016-03-27 DIAGNOSIS — Z01818 Encounter for other preprocedural examination: Secondary | ICD-10-CM | POA: Insufficient documentation

## 2016-03-27 DIAGNOSIS — Z7982 Long term (current) use of aspirin: Secondary | ICD-10-CM | POA: Insufficient documentation

## 2016-03-27 DIAGNOSIS — Z7984 Long term (current) use of oral hypoglycemic drugs: Secondary | ICD-10-CM | POA: Insufficient documentation

## 2016-03-27 DIAGNOSIS — K819 Cholecystitis, unspecified: Secondary | ICD-10-CM | POA: Insufficient documentation

## 2016-03-27 DIAGNOSIS — Z905 Acquired absence of kidney: Secondary | ICD-10-CM | POA: Insufficient documentation

## 2016-03-27 DIAGNOSIS — Z85528 Personal history of other malignant neoplasm of kidney: Secondary | ICD-10-CM | POA: Diagnosis not present

## 2016-03-27 DIAGNOSIS — E119 Type 2 diabetes mellitus without complications: Secondary | ICD-10-CM | POA: Insufficient documentation

## 2016-03-27 DIAGNOSIS — E039 Hypothyroidism, unspecified: Secondary | ICD-10-CM | POA: Insufficient documentation

## 2016-03-27 DIAGNOSIS — F17200 Nicotine dependence, unspecified, uncomplicated: Secondary | ICD-10-CM | POA: Insufficient documentation

## 2016-03-27 HISTORY — DX: Unspecified osteoarthritis, unspecified site: M19.90

## 2016-03-27 HISTORY — DX: Hypothyroidism, unspecified: E03.9

## 2016-03-27 HISTORY — DX: Headache: R51

## 2016-03-27 HISTORY — DX: Headache, unspecified: R51.9

## 2016-03-27 LAB — CBC
HEMATOCRIT: 39 % (ref 36.0–46.0)
Hemoglobin: 12.2 g/dL (ref 12.0–15.0)
MCH: 30.7 pg (ref 26.0–34.0)
MCHC: 31.3 g/dL (ref 30.0–36.0)
MCV: 98.2 fL (ref 78.0–100.0)
Platelets: 174 10*3/uL (ref 150–400)
RBC: 3.97 MIL/uL (ref 3.87–5.11)
RDW: 15 % (ref 11.5–15.5)
WBC: 7.6 10*3/uL (ref 4.0–10.5)

## 2016-03-27 LAB — BASIC METABOLIC PANEL
Anion gap: 8 (ref 5–15)
BUN: 21 mg/dL — AB (ref 6–20)
CALCIUM: 9.8 mg/dL (ref 8.9–10.3)
CO2: 27 mmol/L (ref 22–32)
CREATININE: 1.52 mg/dL — AB (ref 0.44–1.00)
Chloride: 107 mmol/L (ref 101–111)
GFR calc Af Amer: 37 mL/min — ABNORMAL LOW (ref >60)
GFR, EST NON AFRICAN AMERICAN: 32 mL/min — AB (ref >60)
GLUCOSE: 93 mg/dL (ref 65–99)
POTASSIUM: 5.3 mmol/L — AB (ref 3.5–5.1)
Sodium: 142 mmol/L (ref 135–145)

## 2016-03-27 LAB — GLUCOSE, CAPILLARY: GLUCOSE-CAPILLARY: 118 mg/dL — AB (ref 65–99)

## 2016-03-27 NOTE — Progress Notes (Addendum)
pcp is Dr Everlene Farrier States she saw Dr Skeet Latch Stress test noted 02-20-16 Echo noted 09-29-14 Denies ever having a card cath. Ekg noted 02-07-16 Pt states she does not take her blood sugars- she is afraid of needles.

## 2016-03-27 NOTE — Progress Notes (Signed)
Anesthesia Chart Review:  Pt is a 80 year old female scheduled for laparoscopic (possible open) cholecystectomy with intraoperative cholangiogram on 04/04/2016 with Dr. Marlou Starks.   PMH includes:  HTN, DM, renal insufficiency, hypothyroidism, renal cancer (s/p nephrectomy), GERD. Current smoker. BMI 27  Medications include: ASA, diltiazem, lasix, glipizide, levothyroxine, metoprolol, prilosec.   Preoperative labs reviewed.  HgbA1c pending.   EKG 02/07/16: NSR. ST and T wave abnormality, consider inferior ischemia. ST and T wave abnormality, consider anterolateral ischemia.   Nuclear stress test 02/20/16:   Nuclear stress EF: 63%.  There was no ST segment deviation noted during stress.  This is a low risk study.  The left ventricular ejection fraction is normal (55-65%).  Pt saw Dr. Skeet Latch with cardiology 02/06/16 for pre-op eval. Stress test results above. Pt cleared at low risk.   If no changes, I anticipate pt can proceed with surgery as scheduled.   Willeen Cass, FNP-BC [facility] Short Stay Surgical Center/Anesthesiology Phone: [phone] 03/27/2016 4:20 PM

## 2016-03-27 NOTE — Pre-Procedure Instructions (Addendum)
Sherri Burns  03/27/2016      [facility] [place], [place] [address] Phone: [phone] Fax: [place] [address] - [place], [place] [address] [place] [address] Phone: [phone] Fax: [phone]    Your procedure is scheduled on May 10  Report to [place] at 800 A.M.  Call this number if you have problems the morning of surgery:  [phone]   Remember:  Do not eat food or drink liquids after midnight.  Take these medicines the morning of surgery with A SIP OF WATER clonazepam (Klonopin) if needed, Diltiazem (Cartia XT), Flonase nasal spray if needed, Levothyroxine (Synthroid), Metoprolol (Lopressor), Omeprazole (Prilosec)  Stop taking aspirin, Ibuprofen, Advil, Motrin, Aleve, Herbal medications, Fish Oil   How to Manage Your Diabetes Before and After Surgery  Why is it important to control my blood sugar before and after surgery? . Improving blood sugar levels before and after surgery helps healing and can limit problems. . A way of improving blood sugar control is eating a healthy diet by: o  Eating less sugar and carbohydrates o  Increasing activity/exercise o  Talking with your doctor about reaching your blood sugar goals . High blood sugars (greater than 180 mg/dL) can raise your risk of infections and slow your recovery, so you will need to focus on controlling your diabetes during the weeks before surgery. . Make sure that the doctor who takes care of your diabetes knows about your planned surgery including the date and location.  How do I manage my blood sugar before surgery? . Check your blood sugar at least 4 times a day, starting 2 days before surgery, to make sure that the level is not too high or low. o Check your blood sugar the morning of your surgery when you wake up and every 2 hours until  you get to the Short Stay unit. . If your blood sugar is less than 70 mg/dL, you will need to treat for low blood sugar: o Do not take insulin. o Treat a low blood sugar (less than 70 mg/dL) with  cup of clear juice (cranberry or apple), 4 glucose tablets, OR glucose gel. o Recheck blood sugar in 15 minutes after treatment (to make sure it is greater than 70 mg/dL). If your blood sugar is not greater than 70 mg/dL on recheck, call [phone] for further instructions. . Report your blood sugar to the short stay nurse when you get to Short Stay.  . If you are admitted to the hospital after surgery: o Your blood sugar will be checked by the staff and you will probably be given insulin after surgery (instead of oral diabetes medicines) to make sure you have good blood sugar levels. o The goal for blood sugar control after surgery is 80-180 mg/dL.              WHAT DO I DO ABOUT MY DIABETES MEDICATION?   Marland Kitchen Do not take oral diabetes medicines (pills) the morning of surgery.          Other Instructions:          Patient Signature:  Date:   Nurse Signature:  Date:   Reviewed and Endorsed by [facility] Patient Education Committee, August 2015  Do not wear jewelry, make-up or nail polish.  Do not  wear lotions, powders, or perfumes.  You may wear deodorant.  Do not shave 48 hours prior to surgery.  Men may shave face and neck.  Do not bring valuables to the hospital.  [facility] is not responsible for any belongings or valuables.  Contacts, dentures or bridgework may not be worn into surgery.  Leave your suitcase in the car.  After surgery it may be brought to your room.  For patients admitted to the hospital, discharge time will be determined by your treatment team.  Patients discharged the day of surgery will not be allowed to drive home.   Special instructions: [place] - Preparing for Surgery  Before surgery, you can play an important role.  Because skin is  not sterile, your skin needs to be as free of germs as possible.  You can reduce the number of germs on you skin by washing with CHG (chlorahexidine gluconate) soap before surgery.  CHG is an antiseptic cleaner which kills germs and bonds with the skin to continue killing germs even after washing.  Please DO NOT use if you have an allergy to CHG or antibacterial soaps.  If your skin becomes reddened/irritated stop using the CHG and inform your nurse when you arrive at Short Stay.  Do not shave (including legs and underarms) for at least 48 hours prior to the first CHG shower.  You may shave your face.  Please follow these instructions carefully:   1.  Shower with CHG Soap the night before surgery and the                                morning of Surgery.  2.  If you choose to wash your hair, wash your hair first as usual with your       normal shampoo.  3.  After you shampoo, rinse your hair and body thoroughly to remove the                      Shampoo.  4.  Use CHG as you would any other liquid soap.  You can apply chg directly       to the skin and wash gently with scrungie or a clean washcloth.  5.  Apply the CHG Soap to your body ONLY FROM THE NECK DOWN.        Do not use on open wounds or open sores.  Avoid contact with your eyes,       ears, mouth and genitals (private parts).  Wash genitals (private parts)       with your normal soap.  6.  Wash thoroughly, paying special attention to the area where your surgery        will be performed.  7.  Thoroughly rinse your body with warm water from the neck down.  8.  DO NOT shower/wash with your normal soap after using and rinsing off       the CHG Soap.  9.  Pat yourself dry with a clean towel.            10.  Wear clean pajamas.            11.  Place clean sheets on your bed the night of your first shower and do not        sleep with pets.  Day of Surgery  Do not apply any lotions/deoderants the morning of surgery.  Please  wear clean clothes  to the hospital/surgery center.     Please read over the following fact sheets that you were given. Pain Booklet, Coughing and Deep Breathing and Surgical Site Infection Prevention

## 2016-03-28 ENCOUNTER — Telehealth: Payer: Self-pay

## 2016-03-28 ENCOUNTER — Other Ambulatory Visit: Payer: Self-pay | Admitting: Emergency Medicine

## 2016-03-28 ENCOUNTER — Telehealth: Payer: Self-pay | Admitting: Emergency Medicine

## 2016-03-28 DIAGNOSIS — R739 Hyperglycemia, unspecified: Secondary | ICD-10-CM

## 2016-03-28 LAB — HEMOGLOBIN A1C
HEMOGLOBIN A1C: 6.4 % — AB (ref 4.8–5.6)
Mean Plasma Glucose: 137 mg/dL

## 2016-03-28 MED ORDER — GLIPIZIDE ER 10 MG PO TB24
ORAL_TABLET | ORAL | Status: DC
Start: 2016-03-28 — End: 2016-06-29

## 2016-03-28 NOTE — Telephone Encounter (Signed)
Pt was checking on the status of glipiZIDE (GLUCOTROL XL) 10 MG 24 hr tablet . She is about to take her last pill today. She will need it tomorrow morning.  Advise Please  [phone]

## 2016-03-28 NOTE — Telephone Encounter (Signed)
Pt made aware

## 2016-03-28 NOTE — Telephone Encounter (Addendum)
Is to have surgery next week, 5/10. Today she went for the pre-op visit.  The pharmacy said that Dr. Everlene Farrier needs to authorize the refill of the Glipizide. She is going to take the last dose today. She was given 5 extra pills last week.  Chart reviewed.  She was to stop taking this medication, per Dr. Perfecto Kingdom note 01/04/16. She reports that she has been taking it daily.  Meds ordered this encounter  Medications  . glipiZIDE (GLUCOTROL XL) 10 MG 24 hr tablet    Sig: TAKE 1 TABLET BY MOUTH TWICE DAILY    Dispense:  180 tablet    Refill:  0    Order Specific Question:  Supervising Provider    Answer:  Leandrew Koyanagi [id number]    Follow-up with Dr. Everlene Farrier 04/19/16 as planned.

## 2016-03-29 ENCOUNTER — Ambulatory Visit: Payer: Medicare Other | Admitting: Emergency Medicine

## 2016-03-30 DIAGNOSIS — F419 Anxiety disorder, unspecified: Secondary | ICD-10-CM | POA: Diagnosis not present

## 2016-03-30 DIAGNOSIS — I129 Hypertensive chronic kidney disease with stage 1 through stage 4 chronic kidney disease, or unspecified chronic kidney disease: Secondary | ICD-10-CM | POA: Diagnosis not present

## 2016-03-30 DIAGNOSIS — N189 Chronic kidney disease, unspecified: Secondary | ICD-10-CM | POA: Diagnosis not present

## 2016-03-30 DIAGNOSIS — K219 Gastro-esophageal reflux disease without esophagitis: Secondary | ICD-10-CM | POA: Diagnosis not present

## 2016-03-30 DIAGNOSIS — E785 Hyperlipidemia, unspecified: Secondary | ICD-10-CM | POA: Diagnosis not present

## 2016-03-30 DIAGNOSIS — E1122 Type 2 diabetes mellitus with diabetic chronic kidney disease: Secondary | ICD-10-CM | POA: Diagnosis not present

## 2016-03-30 DIAGNOSIS — K819 Cholecystitis, unspecified: Secondary | ICD-10-CM | POA: Diagnosis not present

## 2016-04-03 DIAGNOSIS — E1122 Type 2 diabetes mellitus with diabetic chronic kidney disease: Secondary | ICD-10-CM | POA: Diagnosis not present

## 2016-04-03 DIAGNOSIS — E785 Hyperlipidemia, unspecified: Secondary | ICD-10-CM | POA: Diagnosis not present

## 2016-04-03 DIAGNOSIS — K219 Gastro-esophageal reflux disease without esophagitis: Secondary | ICD-10-CM | POA: Diagnosis not present

## 2016-04-03 DIAGNOSIS — F419 Anxiety disorder, unspecified: Secondary | ICD-10-CM | POA: Diagnosis not present

## 2016-04-03 DIAGNOSIS — I129 Hypertensive chronic kidney disease with stage 1 through stage 4 chronic kidney disease, or unspecified chronic kidney disease: Secondary | ICD-10-CM | POA: Diagnosis not present

## 2016-04-03 DIAGNOSIS — K819 Cholecystitis, unspecified: Secondary | ICD-10-CM | POA: Diagnosis not present

## 2016-04-03 DIAGNOSIS — N189 Chronic kidney disease, unspecified: Secondary | ICD-10-CM | POA: Diagnosis not present

## 2016-04-04 ENCOUNTER — Ambulatory Visit (HOSPITAL_COMMUNITY): Payer: Medicare Other | Admitting: Emergency Medicine

## 2016-04-04 ENCOUNTER — Encounter (HOSPITAL_COMMUNITY): Payer: Self-pay | Admitting: General Practice

## 2016-04-04 ENCOUNTER — Encounter (HOSPITAL_COMMUNITY)
Admission: RE | Disposition: A | Discharge status: Home / Self Care | Payer: Self-pay | Source: Ambulatory Visit | Attending: General Surgery

## 2016-04-04 ENCOUNTER — Ambulatory Visit (HOSPITAL_COMMUNITY): Payer: Medicare Other | Admitting: Anesthesiology

## 2016-04-04 ENCOUNTER — Ambulatory Visit (HOSPITAL_COMMUNITY)
Admission: RE | Admit: 2016-04-04 | Discharge: 2016-04-07 | Disposition: A | Discharge status: Home / Self Care | Payer: Medicare Other | Source: Ambulatory Visit | Attending: General Surgery | Admitting: General Surgery

## 2016-04-04 DIAGNOSIS — K819 Cholecystitis, unspecified: Secondary | ICD-10-CM | POA: Diagnosis present

## 2016-04-04 DIAGNOSIS — Z7984 Long term (current) use of oral hypoglycemic drugs: Secondary | ICD-10-CM | POA: Insufficient documentation

## 2016-04-04 DIAGNOSIS — E1140 Type 2 diabetes mellitus with diabetic neuropathy, unspecified: Secondary | ICD-10-CM | POA: Diagnosis not present

## 2016-04-04 DIAGNOSIS — R109 Unspecified abdominal pain: Secondary | ICD-10-CM | POA: Diagnosis present

## 2016-04-04 DIAGNOSIS — M1990 Unspecified osteoarthritis, unspecified site: Secondary | ICD-10-CM | POA: Diagnosis not present

## 2016-04-04 DIAGNOSIS — Z79891 Long term (current) use of opiate analgesic: Secondary | ICD-10-CM | POA: Insufficient documentation

## 2016-04-04 DIAGNOSIS — Z7982 Long term (current) use of aspirin: Secondary | ICD-10-CM | POA: Diagnosis not present

## 2016-04-04 DIAGNOSIS — K811 Chronic cholecystitis: Secondary | ICD-10-CM | POA: Insufficient documentation

## 2016-04-04 DIAGNOSIS — B373 Candidiasis of vulva and vagina: Secondary | ICD-10-CM | POA: Diagnosis not present

## 2016-04-04 DIAGNOSIS — Z79899 Other long term (current) drug therapy: Secondary | ICD-10-CM | POA: Insufficient documentation

## 2016-04-04 DIAGNOSIS — F17200 Nicotine dependence, unspecified, uncomplicated: Secondary | ICD-10-CM | POA: Insufficient documentation

## 2016-04-04 DIAGNOSIS — Z905 Acquired absence of kidney: Secondary | ICD-10-CM | POA: Diagnosis not present

## 2016-04-04 DIAGNOSIS — E1122 Type 2 diabetes mellitus with diabetic chronic kidney disease: Secondary | ICD-10-CM | POA: Diagnosis not present

## 2016-04-04 DIAGNOSIS — K219 Gastro-esophageal reflux disease without esophagitis: Secondary | ICD-10-CM | POA: Diagnosis not present

## 2016-04-04 HISTORY — PX: LAPAROSCOPIC CHOLECYSTECTOMY: SUR755

## 2016-04-04 HISTORY — PX: CHOLECYSTECTOMY: SHX55

## 2016-04-04 LAB — GLUCOSE, CAPILLARY
Glucose-Capillary: 85 mg/dL (ref 65–99)
Glucose-Capillary: 126 mg/dL — ABNORMAL HIGH (ref 65–99)

## 2016-04-04 SURGERY — LAPAROSCOPIC CHOLECYSTECTOMY
Anesthesia: General | Site: Abdomen

## 2016-04-04 MED ORDER — LIDOCAINE HCL (CARDIAC) 20 MG/ML IV SOLN
INTRAVENOUS | Status: DC | PRN
  Administered 2016-04-04: 100 mg via INTRAVENOUS

## 2016-04-04 MED ORDER — ONDANSETRON HCL 4 MG/2ML IJ SOLN
4.0000 mg | Freq: Four times a day (QID) | INTRAMUSCULAR | Status: DC | PRN
  Administered 2016-04-04: 4 mg via INTRAVENOUS
  Filled 2016-04-04: qty 2

## 2016-04-04 MED ORDER — SODIUM CHLORIDE 0.9 % IR SOLN
Status: DC | PRN
  Administered 2016-04-04: 1000 mL

## 2016-04-04 MED ORDER — ONDANSETRON 4 MG PO TBDP: 4.0000 mg | ORAL_TABLET | Freq: Four times a day (QID) | ORAL | Status: DC | PRN

## 2016-04-04 MED ORDER — MIDAZOLAM HCL 2 MG/2ML IJ SOLN
INTRAMUSCULAR | Status: DC | PRN
  Administered 2016-04-04: 2 mg via INTRAVENOUS

## 2016-04-04 MED ORDER — CIPROFLOXACIN IN D5W 400 MG/200ML IV SOLN: 400.0000 mg | INTRAVENOUS | Status: DC

## 2016-04-04 MED ORDER — ONDANSETRON HCL 4 MG/2ML IJ SOLN
INTRAMUSCULAR | Status: DC | PRN
  Administered 2016-04-04: 4 mg via INTRAVENOUS

## 2016-04-04 MED ORDER — CHLORHEXIDINE GLUCONATE 4 % EX LIQD: 1.0000 "application " | Freq: Once | CUTANEOUS | Status: DC

## 2016-04-04 MED ORDER — HYDROCODONE-ACETAMINOPHEN 5-325 MG PO TABS
1.0000 | ORAL_TABLET | ORAL | Status: DC | PRN
  Administered 2016-04-04 – 2016-04-05 (×2): 2 via ORAL
  Administered 2016-04-05: 1 via ORAL
  Administered 2016-04-06: 2 via ORAL
  Filled 2016-04-04: qty 2
  Filled 2016-04-04: qty 1
  Filled 2016-04-04 (×2): qty 2

## 2016-04-04 MED ORDER — EPHEDRINE SULFATE 50 MG/ML IJ SOLN
INTRAMUSCULAR | Status: DC | PRN
  Administered 2016-04-04: 15 mg via INTRAVENOUS

## 2016-04-04 MED ORDER — ONDANSETRON HCL 4 MG/2ML IJ SOLN: 4.0000 mg | Freq: Once | INTRAMUSCULAR | Status: DC | PRN

## 2016-04-04 MED ORDER — PROPOFOL 10 MG/ML IV BOLUS
INTRAVENOUS | Status: DC | PRN
  Administered 2016-04-04: 120 mg via INTRAVENOUS

## 2016-04-04 MED ORDER — ASPIRIN EC 81 MG PO TBEC
81.0000 mg | DELAYED_RELEASE_TABLET | Freq: Every day | ORAL | Status: DC
  Administered 2016-04-05 – 2016-04-07 (×3): 81 mg via ORAL
  Filled 2016-04-04 (×3): qty 1

## 2016-04-04 MED ORDER — GLIPIZIDE ER 10 MG PO TB24
10.0000 mg | ORAL_TABLET | Freq: Every day | ORAL | Status: DC
  Administered 2016-04-05 – 2016-04-07 (×3): 10 mg via ORAL
  Filled 2016-04-04 (×3): qty 1

## 2016-04-04 MED ORDER — HYDROMORPHONE HCL 1 MG/ML IJ SOLN: 0.2500 mg | INTRAMUSCULAR | Status: DC | PRN

## 2016-04-04 MED ORDER — MIDAZOLAM HCL 2 MG/2ML IJ SOLN
INTRAMUSCULAR | Status: AC
  Filled 2016-04-04: qty 2

## 2016-04-04 MED ORDER — METOPROLOL TARTRATE 100 MG PO TABS
100.0000 mg | ORAL_TABLET | Freq: Two times a day (BID) | ORAL | Status: DC
  Administered 2016-04-04 – 2016-04-07 (×6): 100 mg via ORAL
  Filled 2016-04-04 (×6): qty 1

## 2016-04-04 MED ORDER — FENTANYL CITRATE (PF) 100 MCG/2ML IJ SOLN
25.0000 ug | INTRAMUSCULAR | Status: DC | PRN
  Administered 2016-04-04: 50 ug via INTRAVENOUS
  Filled 2016-04-04: qty 2

## 2016-04-04 MED ORDER — EPHEDRINE 5 MG/ML INJ
INTRAVENOUS | Status: AC
  Filled 2016-04-04: qty 10

## 2016-04-04 MED ORDER — BUPIVACAINE-EPINEPHRINE (PF) 0.25% -1:200000 IJ SOLN
INTRAMUSCULAR | Status: AC
  Filled 2016-04-04: qty 30

## 2016-04-04 MED ORDER — KCL IN DEXTROSE-NACL 20-5-0.9 MEQ/L-%-% IV SOLN
INTRAVENOUS | Status: DC
  Administered 2016-04-04: 17:00:00 via INTRAVENOUS
  Administered 2016-04-05: 1 mL via INTRAVENOUS
  Filled 2016-04-04 (×5): qty 1000

## 2016-04-04 MED ORDER — FUROSEMIDE 40 MG PO TABS
40.0000 mg | ORAL_TABLET | Freq: Every day | ORAL | Status: DC
  Administered 2016-04-04 – 2016-04-07 (×4): 40 mg via ORAL
  Filled 2016-04-04 (×4): qty 1

## 2016-04-04 MED ORDER — IOPAMIDOL (ISOVUE-300) INJECTION 61%
INTRAVENOUS | Status: AC
  Filled 2016-04-04: qty 50

## 2016-04-04 MED ORDER — ARTIFICIAL TEARS OP OINT
TOPICAL_OINTMENT | OPHTHALMIC | Status: AC
  Filled 2016-04-04: qty 3.5

## 2016-04-04 MED ORDER — BUPIVACAINE-EPINEPHRINE 0.25% -1:200000 IJ SOLN
INTRAMUSCULAR | Status: DC | PRN
  Administered 2016-04-04: 15 mL

## 2016-04-04 MED ORDER — SODIUM CHLORIDE 0.9 % IV SOLN
INTRAVENOUS | Status: DC | PRN
  Administered 2016-04-04: 11:00:00

## 2016-04-04 MED ORDER — CEFAZOLIN SODIUM 1 G IJ SOLR
INTRAMUSCULAR | Status: DC | PRN
  Administered 2016-04-04: 2 g via INTRAMUSCULAR

## 2016-04-04 MED ORDER — PANTOPRAZOLE SODIUM 40 MG IV SOLR: 40.0000 mg | Freq: Every day | INTRAVENOUS | Status: DC

## 2016-04-04 MED ORDER — LEVOTHYROXINE SODIUM 75 MCG PO TABS
75.0000 ug | ORAL_TABLET | Freq: Every day | ORAL | Status: DC
  Administered 2016-04-05 – 2016-04-07 (×3): 75 ug via ORAL
  Filled 2016-04-04 (×3): qty 1

## 2016-04-04 MED ORDER — SUGAMMADEX SODIUM 200 MG/2ML IV SOLN
INTRAVENOUS | Status: DC | PRN
  Administered 2016-04-04: 153.8 mg via INTRAVENOUS

## 2016-04-04 MED ORDER — FENTANYL CITRATE (PF) 100 MCG/2ML IJ SOLN
INTRAMUSCULAR | Status: DC | PRN
  Administered 2016-04-04: 50 ug via INTRAVENOUS

## 2016-04-04 MED ORDER — HYDROCODONE-ACETAMINOPHEN 5-325 MG PO TABS: 1.0000 | ORAL_TABLET | Freq: Four times a day (QID) | ORAL | Status: DC | PRN

## 2016-04-04 MED ORDER — ARTIFICIAL TEARS OP OINT
TOPICAL_OINTMENT | OPHTHALMIC | Status: DC | PRN
  Administered 2016-04-04: 1 via OPHTHALMIC

## 2016-04-04 MED ORDER — PROPOFOL 10 MG/ML IV BOLUS
INTRAVENOUS | Status: AC
  Filled 2016-04-04: qty 40

## 2016-04-04 MED ORDER — HEPARIN SODIUM (PORCINE) 5000 UNIT/ML IJ SOLN
5000.0000 [IU] | Freq: Three times a day (TID) | INTRAMUSCULAR | Status: DC
  Administered 2016-04-05 – 2016-04-07 (×7): 5000 [IU] via SUBCUTANEOUS
  Filled 2016-04-04 (×7): qty 1

## 2016-04-04 MED ORDER — 0.9 % SODIUM CHLORIDE (POUR BTL) OPTIME
TOPICAL | Status: DC | PRN
  Administered 2016-04-04: 1000 mL

## 2016-04-04 MED ORDER — MEPERIDINE HCL 25 MG/ML IJ SOLN: 6.2500 mg | INTRAMUSCULAR | Status: DC | PRN

## 2016-04-04 MED ORDER — LACTATED RINGERS IV SOLN
INTRAVENOUS | Status: DC
  Administered 2016-04-04 (×2): via INTRAVENOUS

## 2016-04-04 MED ORDER — PANTOPRAZOLE SODIUM 40 MG PO TBEC
40.0000 mg | DELAYED_RELEASE_TABLET | Freq: Every day | ORAL | Status: DC
  Administered 2016-04-04 – 2016-04-07 (×4): 40 mg via ORAL
  Filled 2016-04-04 (×4): qty 1

## 2016-04-04 MED ORDER — ROCURONIUM BROMIDE 50 MG/5ML IV SOLN
INTRAVENOUS | Status: AC
  Filled 2016-04-04: qty 1

## 2016-04-04 MED ORDER — FENTANYL CITRATE (PF) 250 MCG/5ML IJ SOLN
INTRAMUSCULAR | Status: AC
  Filled 2016-04-04: qty 5

## 2016-04-04 MED ORDER — CIPROFLOXACIN IN D5W 400 MG/200ML IV SOLN
INTRAVENOUS | Status: AC
  Filled 2016-04-04: qty 200

## 2016-04-04 MED ORDER — DILTIAZEM HCL ER COATED BEADS 240 MG PO CP24
240.0000 mg | ORAL_CAPSULE | Freq: Every day | ORAL | Status: DC
  Administered 2016-04-05 – 2016-04-07 (×3): 240 mg via ORAL
  Filled 2016-04-04 (×3): qty 1

## 2016-04-04 MED ORDER — ROCURONIUM BROMIDE 100 MG/10ML IV SOLN
INTRAVENOUS | Status: DC | PRN
  Administered 2016-04-04: 50 mg via INTRAVENOUS

## 2016-04-04 MED ORDER — CLONAZEPAM 0.5 MG PO TABS
0.5000 mg | ORAL_TABLET | Freq: Three times a day (TID) | ORAL | Status: DC | PRN
  Administered 2016-04-04 – 2016-04-06 (×3): 1 mg via ORAL
  Filled 2016-04-04 (×3): qty 2

## 2016-04-04 MED ORDER — ONDANSETRON HCL 4 MG/2ML IJ SOLN
INTRAMUSCULAR | Status: AC
  Filled 2016-04-04: qty 2

## 2016-04-04 SURGICAL SUPPLY — 34 items
APPLIER CLIP 5 13 M/L LIGAMAX5 (MISCELLANEOUS) ×4
APR CLP MED LRG 5 ANG JAW (MISCELLANEOUS) ×2
BAG SPEC RTRVL LRG 6X4 10 (ENDOMECHANICALS) ×2
BLADE SURG ROTATE 9660 (MISCELLANEOUS) IMPLANT
CANISTER SUCTION 2500CC (MISCELLANEOUS) ×4 IMPLANT
CATH REDDICK CHOLANGI 4FR 50CM (CATHETERS) ×1 IMPLANT
CHLORAPREP W/TINT 26ML (MISCELLANEOUS) ×4 IMPLANT
CLIP APPLIE 5 13 M/L LIGAMAX5 (MISCELLANEOUS) ×2 IMPLANT
COVER MAYO STAND STRL (DRAPES) ×4 IMPLANT
COVER SURGICAL LIGHT HANDLE (MISCELLANEOUS) ×4 IMPLANT
DRAPE C-ARM 42X72 X-RAY (DRAPES) ×4 IMPLANT
ELECT REM PT RETURN 9FT ADLT (ELECTROSURGICAL) ×4
ELECTRODE REM PT RTRN 9FT ADLT (ELECTROSURGICAL) ×2 IMPLANT
GLOVE BIO SURGEON STRL SZ7.5 (GLOVE) ×4 IMPLANT
GOWN STRL REUS W/ TWL LRG LVL3 (GOWN DISPOSABLE) ×6 IMPLANT
GOWN STRL REUS W/TWL LRG LVL3 (GOWN DISPOSABLE) ×12
IV CATH 14GX2 1/4 (CATHETERS) ×4 IMPLANT
KIT BASIN OR (CUSTOM PROCEDURE TRAY) ×4 IMPLANT
KIT ROOM TURNOVER OR (KITS) ×4 IMPLANT
LIQUID BAND (GAUZE/BANDAGES/DRESSINGS) ×4 IMPLANT
NS IRRIG 1000ML POUR BTL (IV SOLUTION) ×4 IMPLANT
PAD ARMBOARD 7.5X6 YLW CONV (MISCELLANEOUS) ×4 IMPLANT
POUCH SPECIMEN RETRIEVAL 10MM (ENDOMECHANICALS) ×4 IMPLANT
SCISSORS LAP 5X35 DISP (ENDOMECHANICALS) ×4 IMPLANT
SET IRRIG TUBING LAPAROSCOPIC (IRRIGATION / IRRIGATOR) ×4 IMPLANT
SLEEVE ENDOPATH XCEL 5M (ENDOMECHANICALS) ×8 IMPLANT
SPECIMEN JAR SMALL (MISCELLANEOUS) ×4 IMPLANT
SUT MNCRL AB 4-0 PS2 18 (SUTURE) ×4 IMPLANT
TOWEL OR 17X24 6PK STRL BLUE (TOWEL DISPOSABLE) ×4 IMPLANT
TOWEL OR 17X26 10 PK STRL BLUE (TOWEL DISPOSABLE) ×1 IMPLANT
TRAY LAPAROSCOPIC MC (CUSTOM PROCEDURE TRAY) ×4 IMPLANT
TROCAR XCEL BLUNT TIP 100MML (ENDOMECHANICALS) ×4 IMPLANT
TROCAR XCEL NON-BLD 5MMX100MML (ENDOMECHANICALS) ×4 IMPLANT
TUBING INSUFFLATION (TUBING) ×4 IMPLANT

## 2016-04-04 NOTE — H&P (Signed)
Sherri Burns. [place]  Location: [facility] Patient #: [id number] DOB: 12-18-1935 Widowed / Language: [place] / Race: Black or African American Female   History of Present Illness  Patient words: reck.  The patient is a 80 year old female who presents for a follow-up for Abdominal pain. The patient is a 80 year old black female who was recently hospitalized with cholecystitis. She was treated with a percutaneous drain. She recently had the drain capped and her symptoms recurred. The drain is now back to bag drainage and she feels good. She is ready to schedule her definitive surgery. She does have a history of a right nephrectomy but it is difficult to tell if this was a retroperitoneal approach or a transabdominal approach.   Allergies  CIPROFLOXACIN CLONIDINE CODEINE Crestor *ANTIHYPERLIPIDEMICS* Penicillins SIMVASTATIN Tessalon *COUGH/COLD/ALLERGY*  Medication History  Diflucan (200MG  Tablet, 1 (one) Tablet Oral daily, Taken starting 01/25/2016) Active. Aspirin (81MG  Tablet Chewable, Oral) Active. Cholecalciferol (1000UNIT Capsule, Oral) Active. KlonoPIN (1MG  Tablet, Oral) Active. DiltiaZEM CD (120MG  Capsule ER 24HR, Oral) Active. Diflucan (150MG  Tablet, Oral) Active. Flonase (50MCG/ACT Suspension, Nasal) Active. Lasix (40MG  Tablet, Oral) Active. GlipiZIDE (10MG  Tablet, Oral) Active. Synthroid (75MCG Tablet, Oral) Active. Cozaar (25MG  Tablet, Oral) Active. Lopressor (100MG  Tablet, Oral) Active. Micatin (2% Aerosol, External) Active. Bactroban (2% Cream, External) Active. PriLOSEC (10MG  Capsule DR, Oral) Active. Zofran (4MG  Tablet, Oral) Active. OxyCODONE HCl (5MG  Capsule, Oral) Active. Crestor (10MG  Tablet, Oral) Active. Vitamin E (100UNIT Capsule, Oral) Active. Medications Reconciled    Review of Systems General Present- Appetite Loss, Chills and Fatigue. Not Present- Fever, Night Sweats, Weight Gain and Weight Loss. Skin  Present- Dryness. Not Present- Change in Wart/Mole, Hives, Jaundice, New Lesions, Non-Healing Wounds, Rash and Ulcer. Respiratory Present- Wheezing. Not Present- Bloody sputum, Chronic Cough, Difficulty Breathing and Snoring. Cardiovascular Present- Leg Cramps and Swelling of Extremities. Not Present- Chest Pain, Difficulty Breathing Lying Down, Palpitations, Rapid Heart Rate and Shortness of Breath. Gastrointestinal Present- Hemorrhoids. Not Present- Abdominal Pain, Bloating, Bloody Stool, Change in Bowel Habits, Chronic diarrhea, Constipation, Difficulty Swallowing, Excessive gas, Gets full quickly at meals, Indigestion, Nausea, Rectal Pain and Vomiting. Musculoskeletal Present- Back Pain. Not Present- Joint Pain, Joint Stiffness, Muscle Pain, Muscle Weakness and Swelling of Extremities. Neurological Present- Headaches and Weakness. Not Present- Decreased Memory, Fainting, Numbness, Seizures, Tingling, Tremor and Trouble walking. Psychiatric Present- Anxiety, Change in Sleep Pattern and Depression. Not Present- Bipolar, Fearful and Frequent crying.  Vitals  Weight: 170 lb Height: 66.5in Body Surface Area: 1.88 m Body Mass Index: 27.03 kg/m  Temp.: 64F(Temporal)  Pulse: 76 (Regular)  BP: 124/70 (Sitting, Left Arm, Standard)       Physical Exam  General Mental Status-Alert. General Appearance-Consistent with stated age. Hydration-Well hydrated. Voice-Normal.  Head and Neck Head-normocephalic, atraumatic with no lesions or palpable masses. Trachea-midline. Thyroid Gland Characteristics - normal size and consistency.  Eye Eyeball - Bilateral-Extraocular movements intact. Sclera/Conjunctiva - Bilateral-No scleral icterus.  Chest and Lung Exam Chest and lung exam reveals -quiet, even and easy respiratory effort with no use of accessory muscles and on auscultation, normal breath sounds, no adventitious sounds and normal vocal  resonance. Inspection Chest Wall - Normal. Back - normal.  Cardiovascular Cardiovascular examination reveals -normal heart sounds, regular rate and rhythm with no murmurs and normal pedal pulses bilaterally.  Abdomen Note: The abdomen is soft and nontender. The percutaneous drain is intact.   Neurologic Neurologic evaluation reveals -alert and oriented x 3 with no impairment of recent or remote memory. Mental Status-Normal.  Musculoskeletal Normal Exam - Left-Upper Extremity Strength Normal and Lower Extremity Strength Normal. Normal Exam - Right-Upper Extremity Strength Normal and Lower Extremity Strength Normal.  Lymphatic Head & Neck  General Head & Neck Lymphatics: Bilateral - Description - Normal. Axillary  General Axillary Region: Bilateral - Description - Normal. Tenderness - Non Tender. Femoral & Inguinal  Generalized Femoral & Inguinal Lymphatics: Bilateral - Description - Normal. Tenderness - Non Tender.    Assessment & Plan CHOLECYSTITIS, ACUTE WITH CHOLELITHIASIS (K80.00) Impression: The patient had cholecystitis recently and was treated with a percutaneous cholecystostomy tube. Her symptoms recurred when the tube was capped. At this point she is ready to schedule definitive surgery to remove the gallbladder. I have discussed with her in detail the risks and benefits of the operation to remove the gallbladder as well as some of the technical aspects and she understands and wishes to proceed Current Plans Continued Diflucan 200MG , 1 (one) Tablet daily, #5, 02/21/2016, No Refill.   Signed by Luella Cook, MD

## 2016-04-04 NOTE — Anesthesia Preprocedure Evaluation (Signed)
Anesthesia Evaluation  Patient identified by MRN, date of birth, ID band Patient awake    Reviewed: Allergy & Precautions, NPO status , Patient's Chart, lab work & pertinent test results  Airway Mallampati: I  TM Distance: >3 FB Neck ROM: Full    Dental   Pulmonary Current Smoker,    Pulmonary exam normal        Cardiovascular hypertension, Pt. on medications Normal cardiovascular exam     Neuro/Psych Anxiety    GI/Hepatic   Endo/Other  diabetes, Type 2, Oral Hypoglycemic Agents  Renal/GU Renal InsufficiencyRenal disease     Musculoskeletal   Abdominal   Peds  Hematology   Anesthesia Other Findings   Reproductive/Obstetrics                             Anesthesia Physical Anesthesia Plan  ASA: III  Anesthesia Plan: General   Post-op Pain Management:    Induction: Intravenous  Airway Management Planned: Oral ETT  Additional Equipment:   Intra-op Plan:   Post-operative Plan: Extubation in OR  Informed Consent: I have reviewed the patients History and Physical, chart, labs and discussed the procedure including the risks, benefits and alternatives for the proposed anesthesia with the patient or authorized representative who has indicated his/her understanding and acceptance.     Plan Discussed with: CRNA and Surgeon  Anesthesia Plan Comments:         Anesthesia Quick Evaluation

## 2016-04-04 NOTE — Interval H&P Note (Signed)
History and Physical Interval Note:  04/04/2016 8:48 AM  Sherri Burns  has presented today for surgery, with the diagnosis of Cholecystitis  The various methods of treatment have been discussed with the patient and family. After consideration of risks, benefits and other options for treatment, the patient has consented to  Procedure(s): LAPAROSCOPIC CHOLECYSTECTOMY WITH INTRAOPERATIVE CHOLANGIOGRAM; POSSIBLE OPEN (N/A) as a surgical intervention .  The patient's history has been reviewed, patient examined, no change in status, stable for surgery.  I have reviewed the patient's chart and labs.  Questions were answered to the patient's satisfaction.     TOTH III,Raymondo Garcialopez S

## 2016-04-04 NOTE — Op Note (Signed)
04/04/2016  11:34 AM  PATIENT:  Sherri Burns  80 y.o. female  PRE-OPERATIVE DIAGNOSIS:  Cholecystitis  POST-OPERATIVE DIAGNOSIS:  Cholecystitis  PROCEDURE:  Procedure(s): LAPAROSCOPIC CHOLECYSTECTOMY (N/A)  SURGEON:  Surgeon(s) and Role:    * Jovita Kussmaul, MD - Primary    * Fanny Skates, MD - Assisting  PHYSICIAN ASSISTANT:   ASSISTANTS: Dr. Dalbert Batman   ANESTHESIA:   general  EBL:  Total I/O In: 750 [I.V.:750] Out: 5 [Blood:5]  BLOOD ADMINISTERED:none  DRAINS: none   LOCAL MEDICATIONS USED:  MARCAINE     SPECIMEN:  Source of Specimen:  gallbladder  DISPOSITION OF SPECIMEN:  PATHOLOGY  COUNTS:  YES  TOURNIQUET:  * No tourniquets in log *  DICTATION: .Dragon Dictation   After informed consent was obtained the patient was brought to the operating room and placed in the supine position on the operating table. After adequate induction of general anesthesia the patient's abdomen was prepped with ChloraPrep, allowed to dry, and draped in usual sterile manner. An appropriate timeout was performed. The patient had previously experienced cholecystitis and was treated with a percutaneous cholecystostomy tube. This tube is still in place. I chose to access the abdomen in the left upper quadrant with a 5 mm Optiview port. There was infiltrated with quarter percent Marcaine and a small stab incision was made with a 15 blade knife. A 5 mm Optiview port and camera were used to bluntly dissect to the layers of the abdominal wall until access was gained to the abdominal cavity. The abdomen was then insufflated with carbon dioxide without difficulty. The camera was placed through the port and the abdominal cavity was examined. There were some omental adhesions to the anterior abdominal wall at the edge of the liver and the edge of the liver was also adherent to the abdominal wall from previous surgery. The rest of the abdominal wall was free of any adhesions. The area below the umbilicus  was infiltrated with quarter percent Marcaine. A small incision was made with a 15 blade knife. This incision was carried bluntly through the subcutaneous tissue with a hemostat until the linea alba was identified. The linea alba was incised with a 15 blade knife and it sat was grasped, or clamps. The preperitoneal space was then probed bluntly with a hemostat until the peritoneum was opened and access was gained to the abdominal cavity. A 0 Vicryl pursestring stitch was then placed in the fascia surrounding the opening. Signed cannula was placed through the opening and anchored in place with the previously placed Vicryl pursestring stitch. The camera was then moved to the infraumbilical port. The omental adhesions were taken down sharply with the laparoscopic scissors. The gallbladder was readily identified during this dissection. Care was taken to avoid any injury to the colon and stomach or duodenum. Next 25 mm ports were placed under direct vision along the right abdomen. A blunt grasper was placed through the lateralmost 5 mm port and used to grasp the dome of the gallbladder. Another 5 mm port was placed along the body of the gallbladder for retraction. A dissector was placed through the left upper quadrant port and the peritoneal reflection at the gallbladder neck was opened sharply with electrocautery. Blunt dissection was carried out in this area until the gallbladder neck cystic duct junction was readily identified and a good window was created. The anatomy was very clear and the liver functions were normal. 2 clips were then placed proximally on the cystic duct and one  distally and the duct was divided between the 2. Posterior to this the cystic artery was identified and again dissected bluntly in a circumferential manner until a good window was created. 2 clips placed proximally and one distally on the artery and the artery disease was divided between the 2. Next a laparoscopic hook cautery device was  used to separate the gallbladder from the liver bed. Prior to completely detaching the gallbladder from the liver bed the liver bed was inspected and the area appeared to be completely hemostatic. The drain was removed and the gallbladder was detached from the liver. The drain hole in the liver was fulgurated with the cautery and was completely hemostatic with no leakage of bile either. Next the camera was moved to the left upper quadrant port and a laparoscopic bag was inserted through the [facility] cannula. The gallbladder was placed within the bag and the bag was sealed. The gallbladder was then removed with the signed cannula through the infraumbilical port without difficulty. The fascial defect was then closed with the previously placed Vicryl purse string stitch as well as with another interrupted 0 Vicryl stitch. The liver was inspected again and found to be hemostatic. The rest of the abdominal cavity looked good. The ports were then removed under direct vision and the gas was allowed to escape. The incisions were then closed with interrupted 4-0 Monocryl subcuticular stitches. Dermabond dressings were applied. The patient tolerated the procedure well. At the end of the case all needle sponge counts were correct. The patient was then awakened and taken to recovery in stable condition.  PLAN OF CARE: Admit for overnight observation  PATIENT DISPOSITION:  PACU - hemodynamically stable.   Delay start of Pharmacological VTE agent (>24hrs) due to surgical blood loss or risk of bleeding: no

## 2016-04-04 NOTE — Transfer of Care (Signed)
Immediate Anesthesia Transfer of Care Note  [name]  Procedure(s) Performed: Procedure(s): LAPAROSCOPIC CHOLECYSTECTOMY (N/A)  Patient Location: PACU  Anesthesia Type:General  Level of Consciousness: awake, alert , oriented and sedated  Airway & Oxygen Therapy: Patient Spontanous Breathing and Patient connected to nasal cannula oxygen  Post-op Assessment: Report given to RN, Post -op Vital signs reviewed and stable and Patient moving all extremities  Post vital signs: Reviewed and stable  Last Vitals:  Filed Vitals:   04/04/16 0808  BP: 148/76  Pulse: 64  Temp: 36.8 C  Resp: 18    Last Pain:  Filed Vitals:   04/04/16 0810  PainSc: 3       Patients Stated Pain Goal: 2 ([id number])  Complications: No apparent anesthesia complications

## 2016-04-04 NOTE — Anesthesia Postprocedure Evaluation (Signed)
Anesthesia Post Note  [name]  Procedure(s) Performed: Procedure(s) (LRB): LAPAROSCOPIC CHOLECYSTECTOMY (N/A)  Patient location during evaluation: PACU Anesthesia Type: General Level of consciousness: awake and alert Pain management: pain level controlled Vital Signs Assessment: post-procedure vital signs reviewed and stable Respiratory status: spontaneous breathing, nonlabored ventilation, respiratory function stable and patient connected to nasal cannula oxygen Cardiovascular status: blood pressure returned to baseline and stable Postop Assessment: no signs of nausea or vomiting Anesthetic complications: no    Last Vitals:  Filed Vitals:   04/04/16 1245 04/04/16 1300  BP: 156/85 157/83  Pulse: 57 62  Temp:  36.7 C  Resp: 16 17    Last Pain:  Filed Vitals:   04/04/16 1301  PainSc: Asleep                 Naziyah Tieszen,W. EDMOND

## 2016-04-04 NOTE — Anesthesia Procedure Notes (Signed)
Procedure Name: Intubation Date/Time: 04/04/2016 10:20 AM Performed by: Scheryl Darter Pre-anesthesia Checklist: Patient identified, Emergency Drugs available, Suction available, Patient being monitored and Timeout performed Patient Re-evaluated:Patient Re-evaluated prior to inductionOxygen Delivery Method: Circle System Utilized Preoxygenation: Pre-oxygenation with 100% oxygen Intubation Type: IV induction Ventilation: Mask ventilation without difficulty Laryngoscope Size: Miller and 2 Grade View: Grade I Tube type: Oral Tube size: 7.0 mm Number of attempts: 1 Airway Equipment and Method: Stylet and Oral airway Placement Confirmation: ETT inserted through vocal cords under direct vision,  positive ETCO2 and breath sounds checked- equal and bilateral Secured at: 22 cm Tube secured with: Tape Dental Injury: Teeth and Oropharynx as per pre-operative assessment

## 2016-04-05 ENCOUNTER — Encounter (HOSPITAL_COMMUNITY): Payer: Self-pay | Admitting: General Surgery

## 2016-04-05 DIAGNOSIS — E1140 Type 2 diabetes mellitus with diabetic neuropathy, unspecified: Secondary | ICD-10-CM | POA: Diagnosis not present

## 2016-04-05 DIAGNOSIS — Z7982 Long term (current) use of aspirin: Secondary | ICD-10-CM | POA: Diagnosis not present

## 2016-04-05 DIAGNOSIS — Z79891 Long term (current) use of opiate analgesic: Secondary | ICD-10-CM | POA: Diagnosis not present

## 2016-04-05 DIAGNOSIS — Z79899 Other long term (current) drug therapy: Secondary | ICD-10-CM | POA: Diagnosis not present

## 2016-04-05 DIAGNOSIS — Z905 Acquired absence of kidney: Secondary | ICD-10-CM | POA: Diagnosis not present

## 2016-04-05 DIAGNOSIS — E1122 Type 2 diabetes mellitus with diabetic chronic kidney disease: Secondary | ICD-10-CM | POA: Diagnosis not present

## 2016-04-05 DIAGNOSIS — K811 Chronic cholecystitis: Secondary | ICD-10-CM | POA: Diagnosis not present

## 2016-04-05 DIAGNOSIS — Z7984 Long term (current) use of oral hypoglycemic drugs: Secondary | ICD-10-CM | POA: Diagnosis not present

## 2016-04-05 LAB — GLUCOSE, CAPILLARY: GLUCOSE-CAPILLARY: 122 mg/dL — AB (ref 65–99)

## 2016-04-05 MED ORDER — CLOTRIMAZOLE 2 % VA CREA
1.0000 | TOPICAL_CREAM | Freq: Every day | VAGINAL | Status: DC
  Administered 2016-04-05 – 2016-04-06 (×2): 1 via VAGINAL
  Filled 2016-04-05: qty 21

## 2016-04-05 MED ORDER — FLUCONAZOLE IN SODIUM CHLORIDE 200-0.9 MG/100ML-% IV SOLN
200.0000 mg | INTRAVENOUS | Status: DC
  Administered 2016-04-05 – 2016-04-06 (×2): 200 mg via INTRAVENOUS
  Filled 2016-04-05 (×3): qty 100

## 2016-04-05 NOTE — Care Management Obs Status (Signed)
[facility] NOTIFICATION   Patient Details  Name: KYNADI BERKNER MRN: [id number] Date of Birth: Apr 09, 1936   Medicare Observation Status Notification Given:  Yes (Medicare observation procedure)    Marilu Favre, RN 04/05/2016, 10:35 AM

## 2016-04-05 NOTE — Progress Notes (Signed)
1 Day Post-Op  Subjective: Complains of having no energy. Can't get out of bed. Also complains of yeast infection  Objective: Vital signs in last 24 hours: Temp:  [97.7 F (36.5 C)-99.1 F (37.3 C)] 99.1 F (37.3 C) (05/11 0442) Pulse Rate:  [55-88] 77 (05/11 0442) Resp:  [16-19] 16 (05/11 0442) BP: (134-158)/(74-85) 152/75 mmHg (05/11 0442) SpO2:  [97 %-100 %] 100 % (05/11 0442)    Intake/Output from previous day: 05/10 0701 - 05/11 0700 In: 1931.7 [P.O.:600; I.V.:1331.7] Out: 1155 [Urine:1150; Blood:5] Intake/Output this shift: Total I/O In: 120 [P.O.:120] Out: 300 [Urine:300]  Resp: clear to auscultation bilaterally Cardio: regular rate and rhythm GI: soft, mild tenderness  Lab Results:  No results for input(s): WBC, HGB, HCT, PLT in the last 72 hours. BMET No results for input(s): NA, K, CL, CO2, GLUCOSE, BUN, CREATININE, CALCIUM in the last 72 hours. PT/INR No results for input(s): LABPROT, INR in the last 72 hours. ABG No results for input(s): PHART, HCO3 in the last 72 hours.  Invalid input(s): PCO2, PO2  Studies/Results: No results found.  Anti-infectives: Anti-infectives    Start     Dose/Rate Route Frequency Ordered Stop   04/05/16 1000  fluconazole (DIFLUCAN) IVPB 200 mg     200 mg 100 mL/hr over 60 Minutes Intravenous Every 24 hours 04/05/16 0959     04/04/16 0737  ciprofloxacin (CIPRO) IVPB 400 mg  Status:  Discontinued     400 mg 200 mL/hr over 60 Minutes Intravenous On call to O.R. 04/04/16 0737 04/04/16 1321   04/04/16 0737  ciprofloxacin (CIPRO) 400 MG/200ML IVPB    Comments:  Block, Sarah   : cabinet override      04/04/16 0737 04/04/16 1944      Assessment/Plan: s/p Procedure(s): LAPAROSCOPIC CHOLECYSTECTOMY (N/A) Advance diet  Will start antifungal vag supp and IV diflucan for yeast OOB Hopefully will be ready for discharge tomorrow     TOTH III,Dimitri Shakespeare S 04/05/2016

## 2016-04-06 DIAGNOSIS — E1122 Type 2 diabetes mellitus with diabetic chronic kidney disease: Secondary | ICD-10-CM | POA: Diagnosis not present

## 2016-04-06 DIAGNOSIS — Z905 Acquired absence of kidney: Secondary | ICD-10-CM | POA: Diagnosis not present

## 2016-04-06 DIAGNOSIS — Z79891 Long term (current) use of opiate analgesic: Secondary | ICD-10-CM | POA: Diagnosis not present

## 2016-04-06 DIAGNOSIS — Z79899 Other long term (current) drug therapy: Secondary | ICD-10-CM | POA: Diagnosis not present

## 2016-04-06 DIAGNOSIS — Z7984 Long term (current) use of oral hypoglycemic drugs: Secondary | ICD-10-CM | POA: Diagnosis not present

## 2016-04-06 DIAGNOSIS — E1140 Type 2 diabetes mellitus with diabetic neuropathy, unspecified: Secondary | ICD-10-CM | POA: Diagnosis not present

## 2016-04-06 DIAGNOSIS — K811 Chronic cholecystitis: Secondary | ICD-10-CM | POA: Diagnosis not present

## 2016-04-06 DIAGNOSIS — Z7982 Long term (current) use of aspirin: Secondary | ICD-10-CM | POA: Diagnosis not present

## 2016-04-06 NOTE — Progress Notes (Signed)
2 Days Post-Op  Subjective: Feels dizzy and unsteady when she stands  Objective: Vital signs in last 24 hours: Temp:  [98.8 F (37.1 C)-99.5 F (37.5 C)] 99.2 F (37.3 C) (05/12 0600) Pulse Rate:  [69-74] 74 (05/12 1249) Resp:  [17-19] 18 (05/12 0600) BP: (129-142)/(68-74) 133/72 mmHg (05/12 1249) SpO2:  [90 %-97 %] 90 % (05/12 0600)    Intake/Output from previous day: 05/11 0701 - 05/12 0700 In: 1252.5 [P.O.:720; I.V.:532.5] Out: 1700 [Urine:1700] Intake/Output this shift: Total I/O In: 360 [P.O.:360] Out: 400 [Urine:400]  Resp: clear to auscultation bilaterally Cardio: regular rate and rhythm GI: soft, mild tenderness  Lab Results:  No results for input(s): WBC, HGB, HCT, PLT in the last 72 hours. BMET No results for input(s): NA, K, CL, CO2, GLUCOSE, BUN, CREATININE, CALCIUM in the last 72 hours. PT/INR No results for input(s): LABPROT, INR in the last 72 hours. ABG No results for input(s): PHART, HCO3 in the last 72 hours.  Invalid input(s): PCO2, PO2  Studies/Results: No results found.  Anti-infectives: Anti-infectives    Start     Dose/Rate Route Frequency Ordered Stop   04/05/16 1200  fluconazole (DIFLUCAN) IVPB 200 mg     200 mg 100 mL/hr over 60 Minutes Intravenous Every 24 hours 04/05/16 0959     04/04/16 0737  ciprofloxacin (CIPRO) IVPB 400 mg  Status:  Discontinued     400 mg 200 mL/hr over 60 Minutes Intravenous On call to O.R. 04/04/16 0737 04/04/16 1321   04/04/16 0737  ciprofloxacin (CIPRO) 400 MG/200ML IVPB    Comments:  Block, Sherri Burns   : cabinet override      04/04/16 0737 04/04/16 1944      Assessment/Plan: s/p Procedure(s): LAPAROSCOPIC CHOLECYSTECTOMY (N/A) Advance diet  Will ask PT to eval and treat Hopefully home tomorrow     TOTH Burns,Sherri Fjeld S 04/06/2016

## 2016-04-07 DIAGNOSIS — E1122 Type 2 diabetes mellitus with diabetic chronic kidney disease: Secondary | ICD-10-CM | POA: Diagnosis not present

## 2016-04-07 DIAGNOSIS — Z7982 Long term (current) use of aspirin: Secondary | ICD-10-CM | POA: Diagnosis not present

## 2016-04-07 DIAGNOSIS — K811 Chronic cholecystitis: Secondary | ICD-10-CM | POA: Diagnosis not present

## 2016-04-07 DIAGNOSIS — E1140 Type 2 diabetes mellitus with diabetic neuropathy, unspecified: Secondary | ICD-10-CM | POA: Diagnosis not present

## 2016-04-07 DIAGNOSIS — Z79891 Long term (current) use of opiate analgesic: Secondary | ICD-10-CM | POA: Diagnosis not present

## 2016-04-07 DIAGNOSIS — Z7984 Long term (current) use of oral hypoglycemic drugs: Secondary | ICD-10-CM | POA: Diagnosis not present

## 2016-04-07 DIAGNOSIS — Z905 Acquired absence of kidney: Secondary | ICD-10-CM | POA: Diagnosis not present

## 2016-04-07 DIAGNOSIS — Z79899 Other long term (current) drug therapy: Secondary | ICD-10-CM | POA: Diagnosis not present

## 2016-04-07 LAB — GLUCOSE, CAPILLARY: GLUCOSE-CAPILLARY: 151 mg/dL — AB (ref 65–99)

## 2016-04-07 MED ORDER — HYDROCODONE-ACETAMINOPHEN 5-325 MG PO TABS: 1.0000 | ORAL_TABLET | ORAL | Status: DC | PRN

## 2016-04-07 NOTE — Progress Notes (Signed)
Physical Therapy Evaluation & Discharge Patient Details Name: Sherri Burns MRN: [id number] DOB: 1936/08/18 Today's Date: 04/07/2016   History of Present Illness  80 y.o. female with h/o DM2, HTN, CKD, nephrectomy 2* renal cancer admitted with cholecystitis. s/p laproscopic surgery 04/04/16. Reported Dizziness to physician yesterday.  PT evaluation for need.  Clinical Impression  Patient reports dizziness has been a factor since prior to surgery, states she takes multiple medications, and adjusts timing of medications to allow her to run errands before taking remainder of meds, because she will be more dizzy afterwards.  Brief assessment of positioning, orthostatic, vestibular, and standing balance all negative today for increased symptoms.  Patient did complain of low back pain with Sit > Supine, instructed patient in ROM exercises.  Patient dizziness appears at this time to be related to side effects of medications vs PT treatable issue, recommended for patient to follow up with primary physician to review medications.  Will sign off on PT services, evaluation and DC only.    Follow Up Recommendations No PT follow up    Equipment Recommendations  None recommended by PT    Recommendations for Other Services       Precautions / Restrictions Precautions Precautions: None Restrictions Weight Bearing Restrictions: No      Mobility  Bed Mobility Overal bed mobility: Independent             General bed mobility comments: Patient reports sharp low back pain with Sit > supine, suspect arthritic nature.  Transfers Overall transfer level: Independent Equipment used: None                Ambulation/Gait Ambulation/Gait assistance: Independent Ambulation Distance (Feet): 150 Feet Assistive device: None Gait Pattern/deviations: WFL(Within Functional Limits)   Gait velocity interpretation: at or above normal speed for age/gender    Stairs            Wheelchair  Mobility    Modified Rankin (Stroke Patients Only)       Balance Overall balance assessment: Independent                               Standardized Balance Assessment Standardized Balance Assessment : Berg Balance Test Berg Balance Test Sit to Stand: Able to stand without using hands and stabilize independently Standing Unsupported: Able to stand safely 2 minutes Sitting with Back Unsupported but Feet Supported on Floor or Stool: Able to sit safely and securely 2 minutes Stand to Sit: Sits safely with minimal use of hands Transfers: Able to transfer safely, minor use of hands Standing Unsupported with Eyes Closed: Able to stand 10 seconds safely Standing Ubsupported with Feet Together: Able to place feet together independently and stand 1 minute safely From Standing, Reach Forward with Outstretched Arm: Can reach confidently >25 cm (10") From Standing Position, Pick up Object from Floor: Able to pick up shoe safely and easily From Standing Position, Turn to Look Behind Over each Shoulder: Looks behind from both sides and weight shifts well Turn 360 Degrees: Able to turn 360 degrees safely but slowly Standing Unsupported, Alternately Place Feet on Step/Stool: Able to stand independently and complete 8 steps >20 seconds Standing Unsupported, One Foot in Front: Able to plae foot ahead of the other independently and hold 30 seconds Standing on One Leg: Able to lift leg independently and hold 5-10 seconds Total Score: 51         Pertinent Vitals/Pain Pain Assessment: 0-10  Pain Score: 4  Pain Location: Abdomen Pain Descriptors / Indicators: Sore Pain Intervention(s): Limited activity within patient's tolerance    Home Living Family/patient expects to be discharged to:: Private residence Living Arrangements: Alone Available Help at Discharge: Family;Friend(s);Available PRN/intermittently Type of Home: House Home Access: Stairs to enter   [employer] of  Steps: 1 Home Layout: One level Home Equipment: Cane - single point (does not need to use all the time.)      Prior Function Level of Independence: Independent               Hand Dominance        Extremity/Trunk Assessment   Upper Extremity Assessment: Overall WFL for tasks assessed           Lower Extremity Assessment: Overall WFL for tasks assessed         Communication   Communication: No difficulties  Cognition Arousal/Alertness: Awake/alert Behavior During Therapy: WFL for tasks assessed/performed Overall Cognitive Status: Within Functional Limits for tasks assessed                      General Comments      Exercises Other Exercises Other Exercises: Heel slides Other Exercises: knee rocking  Other Exercises: trunk twist Other Exercises: lateral trunk bend      Assessment/Plan    PT Assessment Patent does not need any further PT services  PT Diagnosis Acute pain   PT Problem List    PT Treatment Interventions     PT Goals (Current goals can be found in the Care Plan section) Acute Rehab PT Goals Patient Stated Goal: To feel better. PT Goal Formulation: All assessment and education complete, DC therapy    Frequency     Barriers to discharge        Co-evaluation               End of Session Equipment Utilized During Treatment: Gait belt Activity Tolerance: Patient tolerated treatment well Patient left: in bed;with call bell/phone within reach Nurse Communication: Mobility status    Functional Assessment Tool Used: Clinical judgement. Functional Limitation: Mobility: Walking and moving around Mobility: Walking and Moving Around Current Status [phone]): At least 1 percent but less than 20 percent impaired, limited or restricted Mobility: Walking and Moving Around Goal Status [phone]): At least 1 percent but less than 20 percent impaired, limited or restricted Mobility: Walking and Moving Around Discharge Status [phone]): At  least 1 percent but less than 20 percent impaired, limited or restricted    Time: 0907-0938 PT Time Calculation (min) (ACUTE ONLY): 31 min   Charges:   PT Evaluation $PT Eval Low Complexity: 1 Procedure PT Treatments $Therapeutic Activity: 8-22 mins   PT G Codes:   PT G-Codes **NOT FOR INPATIENT CLASS** Functional Assessment Tool Used: Clinical judgement. Functional Limitation: Mobility: Walking and moving around Mobility: Walking and Moving Around Current Status [phone]): At least 1 percent but less than 20 percent impaired, limited or restricted Mobility: Walking and Moving Around Goal Status [phone]): At least 1 percent but less than 20 percent impaired, limited or restricted Mobility: Walking and Moving Around Discharge Status [phone]): At least 1 percent but less than 20 percent impaired, limited or restricted    Zenia Resides, Khasir Woodrome L 04/07/2016, 9:45 AM

## 2016-04-07 NOTE — Discharge Instructions (Signed)
See above  Walk around the block every day  Low-fat diet  Drink lots of fluids

## 2016-04-07 NOTE — Progress Notes (Signed)
Pt ready for DC.  DC copy of instructions given and explained.  Rx for vicodin given and explained.  Pt understands follow up appt for Dr. Marlou Starks in 2 weeks and has # to call.

## 2016-04-07 NOTE — Progress Notes (Signed)
Patient ID: [id number] CUNDIFF [id number] y.o. 1936-10-08  Admit date: 04/04/2016  Discharge date and time: 04/07/2016  Admitting Physician: Toth,Paul  Discharge Physician: Adin Hector  Admission Diagnoses: Cholecystitis  Discharge Diagnoses: Chronic cholecystitis                                         Type 2 diabetes mellitus with neuropathy                                         CKD III                                         Essential hypertension                                         History right nephrectomy                                        Operations: Procedure(s): LAPAROSCOPIC CHOLECYSTECTOMY  Admission Condition: good  Discharged Condition: good  Indication for Admission:  The patient is a 80 year old black female who was recently hospitalized with cholecystitis. She was treated with a percutaneous drain. She recently had the drain capped and her symptoms recurred. The drain is now back to bag drainage and she feels good. She is ready to schedule her definitive surgery. She does have a history of a right nephrectomy but it is difficult to tell if this was a retroperitoneal approach or a transabdominal approach.  Hospital Course: On the day of admission the patient was taken to the operating room.  Dr. Marlou Starks performed laparoscopic cholecystectomy, extensive lysis of adhesions, and removal of the drain.  The surgery was uneventful.  The patient was observed overnight and did very well.  She was on postop day 1 she was ambulatory, tolerating diet, voiding, comfortable, and wanted to go home.  Her abdominal exam revealed that her wounds looked good and her abdomen was soft and minimally tender.  She was given instruction in diet and activities.  She was asked to return to see Dr. Marlou Starks in 3 weeks.  She was given a prescription for Norco for pain.  Consults: None  Significant Diagnostic Studies: Surgical pathology, pending  Treatments: surgery: Laparoscopic  cholecystectomy  Disposition: Home  Patient Instructions:    Medication List    TAKE these medications        aspirin 81 MG tablet  Take 81 mg by mouth daily.     clonazePAM 1 MG tablet  Commonly known as:  KLONOPIN  Take 1/2 tab in AM and 1/2 tab in afternoon and 1 at HS     diltiazem 240 MG 24 hr capsule  Commonly known as:  CARTIA XT  Take 1 capsule (240 mg total) by mouth daily.     fluticasone 50 MCG/ACT nasal spray  Commonly known as:  FLONASE  Place 2 sprays into both nostrils daily.     furosemide 40 MG tablet  Commonly known as:  LASIX  Take 1 tablet (40 mg total) by mouth daily.     glipiZIDE 10 MG 24 hr tablet  Commonly known as:  GLUCOTROL XL  TAKE 1 TABLET BY MOUTH TWICE DAILY     HYDROcodone-acetaminophen 5-325 MG tablet  Commonly known as:  NORCO  Take 1-2 tablets by mouth every 6 (six) hours as needed.     HYDROcodone-acetaminophen 5-325 MG tablet  Commonly known as:  NORCO/VICODIN  Take 1-2 tablets by mouth every 4 (four) hours as needed for moderate pain.     levothyroxine 75 MCG tablet  Commonly known as:  SYNTHROID, LEVOTHROID  Take 1 tablet (75 mcg total) by mouth daily before breakfast.     metoprolol 100 MG tablet  Commonly known as:  LOPRESSOR  TAKE 1 TABLET BY MOUTH TWICE DAILY     omeprazole 40 MG capsule  Commonly known as:  PRILOSEC  Take 1 capsule (40 mg total) by mouth daily.     polyethylene glycol packet  Commonly known as:  MIRALAX / GLYCOLAX  Take 17 g by mouth daily.     VITAMIN D-3 PO  Take 1 tablet by mouth daily.     vitamin E 100 UNIT capsule  Take 100 Units by mouth daily.        Activity: activity as tolerated Diet: low fat, low cholesterol diet Wound Care: none needed  Follow-up:  With Dr. Marlou Starks in 3 weeks.  Signed: Edsel Petrin. Dalbert Batman, M.D., FACS General and minimally invasive surgery Breast and Colorectal Surgery  04/07/2016, 10:30 AM

## 2016-04-13 ENCOUNTER — Telehealth: Payer: Self-pay | Admitting: Emergency Medicine

## 2016-04-13 DIAGNOSIS — F419 Anxiety disorder, unspecified: Secondary | ICD-10-CM | POA: Diagnosis not present

## 2016-04-13 DIAGNOSIS — K819 Cholecystitis, unspecified: Secondary | ICD-10-CM | POA: Diagnosis not present

## 2016-04-13 DIAGNOSIS — E785 Hyperlipidemia, unspecified: Secondary | ICD-10-CM | POA: Diagnosis not present

## 2016-04-13 DIAGNOSIS — I129 Hypertensive chronic kidney disease with stage 1 through stage 4 chronic kidney disease, or unspecified chronic kidney disease: Secondary | ICD-10-CM | POA: Diagnosis not present

## 2016-04-13 DIAGNOSIS — N189 Chronic kidney disease, unspecified: Secondary | ICD-10-CM | POA: Diagnosis not present

## 2016-04-13 DIAGNOSIS — K219 Gastro-esophageal reflux disease without esophagitis: Secondary | ICD-10-CM | POA: Diagnosis not present

## 2016-04-13 DIAGNOSIS — E1122 Type 2 diabetes mellitus with diabetic chronic kidney disease: Secondary | ICD-10-CM | POA: Diagnosis not present

## 2016-04-13 NOTE — Telephone Encounter (Signed)
A nurse from [facility] called stating she need a verbal order to continue with skill nursing services. Patient was in the hospital for surgery. Nurse need to resume her care. Domenic Schwab [phone].

## 2016-04-14 NOTE — Telephone Encounter (Signed)
Please give verbal order for patient to continue home health care

## 2016-04-16 NOTE — Telephone Encounter (Signed)
Renold Don, the nurse.

## 2016-04-18 DIAGNOSIS — F419 Anxiety disorder, unspecified: Secondary | ICD-10-CM | POA: Diagnosis not present

## 2016-04-18 DIAGNOSIS — E1122 Type 2 diabetes mellitus with diabetic chronic kidney disease: Secondary | ICD-10-CM | POA: Diagnosis not present

## 2016-04-18 DIAGNOSIS — N189 Chronic kidney disease, unspecified: Secondary | ICD-10-CM | POA: Diagnosis not present

## 2016-04-18 DIAGNOSIS — K219 Gastro-esophageal reflux disease without esophagitis: Secondary | ICD-10-CM | POA: Diagnosis not present

## 2016-04-18 DIAGNOSIS — I129 Hypertensive chronic kidney disease with stage 1 through stage 4 chronic kidney disease, or unspecified chronic kidney disease: Secondary | ICD-10-CM | POA: Diagnosis not present

## 2016-04-18 DIAGNOSIS — E785 Hyperlipidemia, unspecified: Secondary | ICD-10-CM | POA: Diagnosis not present

## 2016-04-18 DIAGNOSIS — K819 Cholecystitis, unspecified: Secondary | ICD-10-CM | POA: Diagnosis not present

## 2016-04-19 ENCOUNTER — Ambulatory Visit (INDEPENDENT_AMBULATORY_CARE_PROVIDER_SITE_OTHER): Payer: Medicare Other | Admitting: Emergency Medicine

## 2016-04-19 ENCOUNTER — Encounter: Payer: Self-pay | Admitting: Emergency Medicine

## 2016-04-19 VITALS — BP 130/84 | HR 64 | Temp 97.9°F | Resp 16 | Ht 65.0 in | Wt 167.6 lb

## 2016-04-19 DIAGNOSIS — N898 Other specified noninflammatory disorders of vagina: Secondary | ICD-10-CM

## 2016-04-19 DIAGNOSIS — F32A Depression, unspecified: Secondary | ICD-10-CM

## 2016-04-19 DIAGNOSIS — A499 Bacterial infection, unspecified: Secondary | ICD-10-CM | POA: Diagnosis not present

## 2016-04-19 DIAGNOSIS — F329 Major depressive disorder, single episode, unspecified: Secondary | ICD-10-CM | POA: Diagnosis not present

## 2016-04-19 DIAGNOSIS — B9689 Other specified bacterial agents as the cause of diseases classified elsewhere: Secondary | ICD-10-CM

## 2016-04-19 DIAGNOSIS — N760 Acute vaginitis: Secondary | ICD-10-CM | POA: Diagnosis not present

## 2016-04-19 DIAGNOSIS — E119 Type 2 diabetes mellitus without complications: Secondary | ICD-10-CM | POA: Diagnosis not present

## 2016-04-19 DIAGNOSIS — K8001 Calculus of gallbladder with acute cholecystitis with obstruction: Secondary | ICD-10-CM | POA: Diagnosis not present

## 2016-04-19 LAB — BASIC METABOLIC PANEL WITH GFR
BUN: 11 mg/dL (ref 7–25)
CO2: 28 mmol/L (ref 20–31)
Calcium: 9.5 mg/dL (ref 8.6–10.4)
Chloride: 105 mmol/L (ref 98–110)
Creat: 1.25 mg/dL — ABNORMAL HIGH (ref 0.60–0.93)
GFR, EST AFRICAN AMERICAN: 48 mL/min — AB (ref >=60)
GFR, EST NON AFRICAN AMERICAN: 42 mL/min — AB (ref >=60)
Glucose, Bld: 67 mg/dL (ref 65–99)
POTASSIUM: 4.3 mmol/L (ref 3.5–5.3)
Sodium: 142 mmol/L (ref 135–146)

## 2016-04-19 LAB — POCT WET + KOH PREP
Trich by wet prep: ABSENT
YEAST BY KOH: ABSENT
YEAST BY WET PREP: ABSENT

## 2016-04-19 MED ORDER — METRONIDAZOLE 0.75 % VA GEL: 1.0000 | Freq: Two times a day (BID) | VAGINAL | Status: DC

## 2016-04-19 NOTE — Progress Notes (Addendum)
By signing my name below, I, Mesha Guinyard, attest that this documentation has been prepared under the direction and in the presence of Arlyss Queen, MD.  Electronically Signed: Verlee Monte, Medical Scribe. 04/19/2016. 1:18 PM.  Chief Complaint:  Chief Complaint  Patient presents with  . Follow-up  . Diabetes  . Hypertension  . Medication Refill    HPI: Sherri Burns is a 80 y.o. female with a PMHx of DM, and HTN who reports to [facility] today for a follow-up. Pt reports feeling stressed from her family members. Pt mentions pain in her right lower abdomen that occurs at 3am. Pt states she's supposed to see the surgeon for follow-up on her cholecystectomy May 30th. Pt mentions vaginal infection with discharge isn't gone, and doesn't believe it's a yeast infection. Pt reports having intercourse 3 times without condoms with a partner who hasn't been sexually active with anyone else in 4 years before she had her cholecystectomy. Pt had diarrhea before she came here. Pt mentions the patch that was put on her right flank wasn't taken off but needs to be changed. Pt has been fasting.   Past Medical History  Diagnosis Date  . Hypertension   . GERD (gastroesophageal reflux disease)     + hpylori  . Hyperlipidemia   . Anxiety   . Cancer ([place]) 15 yrs ago    ho renal s/p nephrectomy  . Diabetes mellitus   . Elevated TSH   . Elevated serum creatinine   . Ischemic colitis ([place])   . Renal insufficiency   . Colon polyps 11/2008    tubular adenoma  . Esophageal problem     esophageal dilation  . Abnormal EKG 02/07/2016    Inferolateral T wave inversion and ST depression.  . Hypothyroidism   . Headache   . Arthritis    Past Surgical History  Procedure Laterality Date  . Nephrectomy Right   . Abdominal hysterectomy  1978    partial  . Colonoscopy    . Esophageal dilation  2016  . Hernia repair    . Laparoscopic cholecystectomy  04/04/2016  . Cholecystectomy N/A 04/04/2016   Procedure: LAPAROSCOPIC CHOLECYSTECTOMY;  Surgeon: Autumn Messing III, MD;  Location: [place] [facility];  Service: General;  Laterality: N/A;   Social History   Social History  . Marital Status: Widowed    Spouse Name: N/A  . Number of Children: N/A  . Years of Education: N/A   Social History Main Topics  . Smoking status: Current Some Day Smoker -- 0.25 packs/day for 55 years    Types: Cigarettes  . Smokeless tobacco: Never Used     Comment: cut back amount still  trying to quit  . Alcohol Use: No  . Drug Use: No  . Sexual Activity: Yes   Other Topics Concern  . None   Social History Narrative   Loss of son.   Family History  Problem Relation Age of Onset  . Cancer Mother     colon, kidney cancer  . Cancer Father     throat cancer  . Cancer Sister   . Heart attack Brother    Allergies  Allergen Reactions  . Ciprocin-Fluocin-Procin [Fluocinolone Acetonide]     unknown  . Clonidine Derivatives     Dry mouth  . Codeine     hallucinations  . Crestor [Rosuvastatin Calcium]     cramps  . Penicillins     Has patient had a PCN reaction causing immediate rash, facial/tongue/throat swelling, SOB  or lightheadedness with hypotension: no Has patient had a PCN reaction causing severe rash involving mucus membranes or skin necrosis: no Has patient had a PCN reaction that required hospitalization : no Has patient had a PCN reaction occurring within the last 10 years: no -Hallucinates If all of the above answers are "NO", then may proceed with Cephalosporin use.   . Simvastatin     Upset stomach   . Tessalon Perles     Gi upset    Prior to Admission medications   Medication Sig Start Date End Date Taking? Authorizing Provider  aspirin 81 MG tablet Take 81 mg by mouth daily.   Yes Historical Provider, MD  Cholecalciferol (VITAMIN D-3 PO) Take 1 tablet by mouth daily.   Yes Historical Provider, MD  clonazePAM (KLONOPIN) 1 MG tablet Take 1/2 tab in AM and 1/2 tab in afternoon and 1 at  [facility] Patient taking differently: Take 0.5-1 mg by mouth 3 (three) times daily as needed for anxiety (sleep). Take 1/2 tab in AM and 1/2 tab in afternoon and 1 at [facility] 11/29/15  Yes [name], MD  diltiazem (CARTIA XT) 240 MG 24 hr capsule Take 1 capsule (240 mg total) by mouth daily. 11/29/15  Yes [name], MD  fluticasone (FLONASE) 50 MCG/ACT nasal spray Place 2 sprays into both nostrils daily. Patient taking differently: Place 1 spray into both nostrils daily as needed for allergies.  01/04/16  Yes [name], MD  furosemide (LASIX) 40 MG tablet Take 1 tablet (40 mg total) by mouth daily. 01/04/16  Yes [name], MD  glipiZIDE (GLUCOTROL XL) 10 MG 24 hr tablet TAKE 1 TABLET BY MOUTH TWICE DAILY 03/28/16  Yes Chelle Jeffery, PA-C  levothyroxine (SYNTHROID, LEVOTHROID) 75 MCG tablet Take 1 tablet (75 mcg total) by mouth daily before breakfast. 01/04/16  Yes [name], MD  metoprolol (LOPRESSOR) 100 MG tablet TAKE 1 TABLET BY MOUTH TWICE DAILY 11/29/15  Yes [name], MD  omeprazole (PRILOSEC) 40 MG capsule Take 1 capsule (40 mg total) by mouth daily. 01/04/16  Yes [name], MD  polyethylene glycol (MIRALAX / GLYCOLAX) packet Take 17 g by mouth daily.   Yes Historical Provider, MD  vitamin E 100 UNIT capsule Take 100 Units by mouth daily.   Yes Historical Provider, MD  HYDROcodone-acetaminophen (NORCO) 5-325 MG tablet Take 1-2 tablets by mouth every 6 (six) hours as needed. 04/04/16   Autumn Messing III, MD  HYDROcodone-acetaminophen (NORCO/VICODIN) 5-325 MG tablet Take 1-2 tablets by mouth every 4 (four) hours as needed for moderate pain. 04/07/16   Fanny Skates, MD     ROS: The patient denies fevers, chills, night sweats, unintentional weight loss, chest pain, palpitations, wheezing, dyspnea on exertion, nausea, vomiting, dysuria, hematuria, melena, numbness, weakness, or tingling. Pt has right lower abdominal pain.   All other systems have been reviewed and were otherwise negative with the  exception of those mentioned in the HPI and as above.    PHYSICAL EXAM: Filed Vitals:   04/19/16 1257  BP: 130/84  Pulse: 64  Temp: 97.9 F (36.6 C)  Resp: 16   Body mass index is 27.89 kg/(m^2).   General: Alert, no acute distress HEENT:  Normocephalic, atraumatic, oropharynx patent. Eye: Juliette Mangle [facility] Cardiovascular:  Regular rate and rhythm, no rubs murmurs or gallops.  No Carotid bruits, radial pulse intact. No pedal edema.  Respiratory: Clear to auscultation bilaterally.  No wheezes, rales, or rhonchi.  No cyanosis, no  use of accessory musculature Abdominal: No organomegaly, abdomen is soft and non-tender, positive bowel sounds.  No masses. Healing incision sites following her cholecystectomy. Musculoskeletal: Gait intact. No edema, tenderness Skin: No rashes. Neurologic: Facial musculature symmetric. Psychiatric: Patient acts appropriately throughout our interaction. Lymphatic: No cervical or submandibular lymphadenopathy GU: Yellowish waterty dishcharge with redness in the vaginal vault  LABS:  Results for orders placed or performed in visit on 04/19/16  POCT Wet + KOH Prep  Result Value Ref Range   Yeast by KOH Absent Present, Absent   Yeast by wet prep Absent Present, Absent   WBC by wet prep Moderate (A) None, Few, Too numerous to count   Clue Cells Wet Prep HPF POC Moderate (A) None, Too numerous to count   Trich by wet prep Absent Present, Absent   Bacteria Wet Prep HPF POC Many (A) None, Few, Too numerous to count   Epithelial Cells By [employer] Pref ([facility]) Few None, Few, Too numerous to count   RBC,UR,HPF,POC None None RBC/hpf     EKG/XRAY:   Primary read interpreted by Dr. Everlene Farrier at [facility].   ASSESSMENT/PLAN:  Patient had moderate clue cells and white cells on her vaginal prep. She has had a exposure. She will be treated with MetroGel twice a day. STD testing was done. She will let me know if this continues to be an issue.Her hemoglobin A1c in the hospital was  improved. She is recovering well from her cholecystectomy. Incisions look great. Gross sideeffects, risk and benefits, and alternatives of medications d/w patient. Patient is aware that all medications have potential sideeffects and we are unable to predict every sideeffect or drug-drug interaction that may occur.  Arlyss Queen MD 04/19/2016 1:17 PM

## 2016-04-19 NOTE — Patient Instructions (Signed)
     IF you received an x-ray today, you will receive an invoice from [facility]. Please contact [facility] at [phone] with questions or concerns regarding your invoice.   IF you received labwork today, you will receive an invoice from [facility]. Please contact [facility] at [phone] with questions or concerns regarding your invoice.   Our billing staff will not be able to assist you with questions regarding bills from these companies.  You will be contacted with the lab results as soon as they are available. The fastest way to get your results is to activate your My Chart account. Instructions are located on the last page of this paperwork. If you have not heard from us regarding the results in 2 weeks, please contact this office.      

## 2016-04-20 LAB — CBC
HCT: 37.3 % (ref 35.0–45.0)
HEMOGLOBIN: 12.2 g/dL (ref 11.7–15.5)
MCH: 32.1 pg (ref 27.0–33.0)
MCHC: 32.7 g/dL (ref 32.0–36.0)
MCV: 98.2 fL (ref 80.0–100.0)
MPV: 10.5 fL (ref 7.5–12.5)
Platelets: 388 10*3/uL (ref 140–400)
RBC: 3.8 MIL/uL (ref 3.80–5.10)
RDW: 15.6 % — ABNORMAL HIGH (ref 11.0–15.0)
WBC: 7.9 10*3/uL (ref 3.8–10.8)

## 2016-04-20 LAB — HIV ANTIBODY (ROUTINE TESTING W REFLEX): HIV 1&2 Ab, 4th Generation: NONREACTIVE

## 2016-04-20 LAB — GC/CHLAMYDIA PROBE AMP
CT Probe RNA: NOT DETECTED
GC PROBE AMP APTIMA: NOT DETECTED

## 2016-04-20 LAB — HEPATITIS C ANTIBODY: HCV AB: NEGATIVE

## 2016-04-20 LAB — RPR

## 2016-04-26 ENCOUNTER — Other Ambulatory Visit: Payer: Self-pay | Admitting: Emergency Medicine

## 2016-04-27 DIAGNOSIS — E1122 Type 2 diabetes mellitus with diabetic chronic kidney disease: Secondary | ICD-10-CM | POA: Diagnosis not present

## 2016-04-27 DIAGNOSIS — E785 Hyperlipidemia, unspecified: Secondary | ICD-10-CM | POA: Diagnosis not present

## 2016-04-27 DIAGNOSIS — F419 Anxiety disorder, unspecified: Secondary | ICD-10-CM | POA: Diagnosis not present

## 2016-04-27 DIAGNOSIS — K219 Gastro-esophageal reflux disease without esophagitis: Secondary | ICD-10-CM | POA: Diagnosis not present

## 2016-04-27 DIAGNOSIS — K819 Cholecystitis, unspecified: Secondary | ICD-10-CM | POA: Diagnosis not present

## 2016-04-27 DIAGNOSIS — N189 Chronic kidney disease, unspecified: Secondary | ICD-10-CM | POA: Diagnosis not present

## 2016-04-27 DIAGNOSIS — I129 Hypertensive chronic kidney disease with stage 1 through stage 4 chronic kidney disease, or unspecified chronic kidney disease: Secondary | ICD-10-CM | POA: Diagnosis not present

## 2016-04-27 NOTE — Telephone Encounter (Signed)
Faxed

## 2016-04-28 DIAGNOSIS — E119 Type 2 diabetes mellitus without complications: Secondary | ICD-10-CM | POA: Diagnosis not present

## 2016-05-03 ENCOUNTER — Telehealth: Payer: Self-pay

## 2016-05-03 DIAGNOSIS — K219 Gastro-esophageal reflux disease without esophagitis: Secondary | ICD-10-CM | POA: Diagnosis not present

## 2016-05-03 DIAGNOSIS — N189 Chronic kidney disease, unspecified: Secondary | ICD-10-CM | POA: Diagnosis not present

## 2016-05-03 DIAGNOSIS — K819 Cholecystitis, unspecified: Secondary | ICD-10-CM | POA: Diagnosis not present

## 2016-05-03 DIAGNOSIS — F419 Anxiety disorder, unspecified: Secondary | ICD-10-CM | POA: Diagnosis not present

## 2016-05-03 DIAGNOSIS — I129 Hypertensive chronic kidney disease with stage 1 through stage 4 chronic kidney disease, or unspecified chronic kidney disease: Secondary | ICD-10-CM | POA: Diagnosis not present

## 2016-05-03 DIAGNOSIS — E785 Hyperlipidemia, unspecified: Secondary | ICD-10-CM | POA: Diagnosis not present

## 2016-05-03 DIAGNOSIS — E1122 Type 2 diabetes mellitus with diabetic chronic kidney disease: Secondary | ICD-10-CM | POA: Diagnosis not present

## 2016-05-03 NOTE — Telephone Encounter (Signed)
Maudie Mercury would like a verbal order for skilled nursing care for patient. Patient had her gallbladder removed a month ago and just recently had her drainage tube removed. Abony also needs help with her hypertension and diabetes  control. Please advise Maudie Mercury at [facility] at [phone]

## 2016-05-03 NOTE — Telephone Encounter (Signed)
Okay to give verbal order for skilled nursing

## 2016-05-03 NOTE — Telephone Encounter (Signed)
Advised Kim.

## 2016-05-11 DIAGNOSIS — E1122 Type 2 diabetes mellitus with diabetic chronic kidney disease: Secondary | ICD-10-CM | POA: Diagnosis not present

## 2016-05-11 DIAGNOSIS — N189 Chronic kidney disease, unspecified: Secondary | ICD-10-CM | POA: Diagnosis not present

## 2016-05-11 DIAGNOSIS — E785 Hyperlipidemia, unspecified: Secondary | ICD-10-CM | POA: Diagnosis not present

## 2016-05-11 DIAGNOSIS — K219 Gastro-esophageal reflux disease without esophagitis: Secondary | ICD-10-CM | POA: Diagnosis not present

## 2016-05-11 DIAGNOSIS — Z48815 Encounter for surgical aftercare following surgery on the digestive system: Secondary | ICD-10-CM | POA: Diagnosis not present

## 2016-05-11 DIAGNOSIS — I129 Hypertensive chronic kidney disease with stage 1 through stage 4 chronic kidney disease, or unspecified chronic kidney disease: Secondary | ICD-10-CM | POA: Diagnosis not present

## 2016-05-11 DIAGNOSIS — F419 Anxiety disorder, unspecified: Secondary | ICD-10-CM | POA: Diagnosis not present

## 2016-05-18 DIAGNOSIS — K219 Gastro-esophageal reflux disease without esophagitis: Secondary | ICD-10-CM | POA: Diagnosis not present

## 2016-05-18 DIAGNOSIS — I129 Hypertensive chronic kidney disease with stage 1 through stage 4 chronic kidney disease, or unspecified chronic kidney disease: Secondary | ICD-10-CM | POA: Diagnosis not present

## 2016-05-18 DIAGNOSIS — Z48815 Encounter for surgical aftercare following surgery on the digestive system: Secondary | ICD-10-CM | POA: Diagnosis not present

## 2016-05-18 DIAGNOSIS — F419 Anxiety disorder, unspecified: Secondary | ICD-10-CM | POA: Diagnosis not present

## 2016-05-18 DIAGNOSIS — E1122 Type 2 diabetes mellitus with diabetic chronic kidney disease: Secondary | ICD-10-CM | POA: Diagnosis not present

## 2016-05-18 DIAGNOSIS — N189 Chronic kidney disease, unspecified: Secondary | ICD-10-CM | POA: Diagnosis not present

## 2016-05-18 DIAGNOSIS — E785 Hyperlipidemia, unspecified: Secondary | ICD-10-CM | POA: Diagnosis not present

## 2016-05-25 DIAGNOSIS — K219 Gastro-esophageal reflux disease without esophagitis: Secondary | ICD-10-CM | POA: Diagnosis not present

## 2016-05-25 DIAGNOSIS — F419 Anxiety disorder, unspecified: Secondary | ICD-10-CM | POA: Diagnosis not present

## 2016-05-25 DIAGNOSIS — E1122 Type 2 diabetes mellitus with diabetic chronic kidney disease: Secondary | ICD-10-CM | POA: Diagnosis not present

## 2016-05-25 DIAGNOSIS — N189 Chronic kidney disease, unspecified: Secondary | ICD-10-CM | POA: Diagnosis not present

## 2016-05-25 DIAGNOSIS — I129 Hypertensive chronic kidney disease with stage 1 through stage 4 chronic kidney disease, or unspecified chronic kidney disease: Secondary | ICD-10-CM | POA: Diagnosis not present

## 2016-05-25 DIAGNOSIS — Z48815 Encounter for surgical aftercare following surgery on the digestive system: Secondary | ICD-10-CM | POA: Diagnosis not present

## 2016-05-25 DIAGNOSIS — E785 Hyperlipidemia, unspecified: Secondary | ICD-10-CM | POA: Diagnosis not present

## 2016-06-01 DIAGNOSIS — E1122 Type 2 diabetes mellitus with diabetic chronic kidney disease: Secondary | ICD-10-CM | POA: Diagnosis not present

## 2016-06-01 DIAGNOSIS — K219 Gastro-esophageal reflux disease without esophagitis: Secondary | ICD-10-CM | POA: Diagnosis not present

## 2016-06-01 DIAGNOSIS — E785 Hyperlipidemia, unspecified: Secondary | ICD-10-CM | POA: Diagnosis not present

## 2016-06-01 DIAGNOSIS — I129 Hypertensive chronic kidney disease with stage 1 through stage 4 chronic kidney disease, or unspecified chronic kidney disease: Secondary | ICD-10-CM | POA: Diagnosis not present

## 2016-06-01 DIAGNOSIS — Z48815 Encounter for surgical aftercare following surgery on the digestive system: Secondary | ICD-10-CM | POA: Diagnosis not present

## 2016-06-01 DIAGNOSIS — N189 Chronic kidney disease, unspecified: Secondary | ICD-10-CM | POA: Diagnosis not present

## 2016-06-07 DIAGNOSIS — E1122 Type 2 diabetes mellitus with diabetic chronic kidney disease: Secondary | ICD-10-CM | POA: Diagnosis not present

## 2016-06-07 DIAGNOSIS — E785 Hyperlipidemia, unspecified: Secondary | ICD-10-CM | POA: Diagnosis not present

## 2016-06-07 DIAGNOSIS — N189 Chronic kidney disease, unspecified: Secondary | ICD-10-CM | POA: Diagnosis not present

## 2016-06-07 DIAGNOSIS — K219 Gastro-esophageal reflux disease without esophagitis: Secondary | ICD-10-CM | POA: Diagnosis not present

## 2016-06-07 DIAGNOSIS — Z48815 Encounter for surgical aftercare following surgery on the digestive system: Secondary | ICD-10-CM | POA: Diagnosis not present

## 2016-06-07 DIAGNOSIS — I129 Hypertensive chronic kidney disease with stage 1 through stage 4 chronic kidney disease, or unspecified chronic kidney disease: Secondary | ICD-10-CM | POA: Diagnosis not present

## 2016-06-08 ENCOUNTER — Encounter (HOSPITAL_COMMUNITY): Payer: Self-pay

## 2016-06-08 ENCOUNTER — Emergency Department (HOSPITAL_COMMUNITY)
Admission: EM | Admit: 2016-06-08 | Discharge: 2016-06-08 | Disposition: A | Discharge status: Home / Self Care | Payer: Medicare Other | Attending: Emergency Medicine | Admitting: Emergency Medicine

## 2016-06-08 ENCOUNTER — Emergency Department (HOSPITAL_COMMUNITY): Payer: Medicare Other

## 2016-06-08 ENCOUNTER — Ambulatory Visit (INDEPENDENT_AMBULATORY_CARE_PROVIDER_SITE_OTHER): Payer: Medicare Other | Admitting: Emergency Medicine

## 2016-06-08 VITALS — BP 124/80 | HR 66 | Temp 98.0°F | Resp 18 | Ht 65.0 in | Wt 169.0 lb

## 2016-06-08 DIAGNOSIS — Z79899 Other long term (current) drug therapy: Secondary | ICD-10-CM | POA: Insufficient documentation

## 2016-06-08 DIAGNOSIS — K625 Hemorrhage of anus and rectum: Secondary | ICD-10-CM | POA: Diagnosis not present

## 2016-06-08 DIAGNOSIS — N289 Disorder of kidney and ureter, unspecified: Secondary | ICD-10-CM | POA: Diagnosis not present

## 2016-06-08 DIAGNOSIS — F17210 Nicotine dependence, cigarettes, uncomplicated: Secondary | ICD-10-CM | POA: Insufficient documentation

## 2016-06-08 DIAGNOSIS — Z85528 Personal history of other malignant neoplasm of kidney: Secondary | ICD-10-CM | POA: Diagnosis not present

## 2016-06-08 DIAGNOSIS — K5721 Diverticulitis of large intestine with perforation and abscess with bleeding: Secondary | ICD-10-CM | POA: Diagnosis not present

## 2016-06-08 DIAGNOSIS — Z7984 Long term (current) use of oral hypoglycemic drugs: Secondary | ICD-10-CM | POA: Insufficient documentation

## 2016-06-08 DIAGNOSIS — E119 Type 2 diabetes mellitus without complications: Secondary | ICD-10-CM | POA: Diagnosis not present

## 2016-06-08 DIAGNOSIS — R1084 Generalized abdominal pain: Secondary | ICD-10-CM

## 2016-06-08 DIAGNOSIS — I10 Essential (primary) hypertension: Secondary | ICD-10-CM | POA: Insufficient documentation

## 2016-06-08 DIAGNOSIS — Z7982 Long term (current) use of aspirin: Secondary | ICD-10-CM | POA: Insufficient documentation

## 2016-06-08 LAB — TYPE AND SCREEN
ABO/RH(D): O POS
ANTIBODY SCREEN: NEGATIVE

## 2016-06-08 LAB — POCT CBC
Granulocyte percent: 50.8 %G (ref 37–80)
HCT, POC: 37.1 % — AB (ref 37.7–47.9)
Hemoglobin: 12.8 g/dL (ref 12.2–16.2)
LYMPH, POC: 2.8 (ref 0.6–3.4)
MCH, POC: 32.7 pg — AB (ref 27–31.2)
MCHC: 34.5 g/dL (ref 31.8–35.4)
MCV: 94.8 fL (ref 80–97)
MID (CBC): 0.6 (ref 0–0.9)
MPV: 7.8 fL (ref 0–99.8)
PLATELET COUNT, POC: 149 10*3/uL (ref 142–424)
POC Granulocyte: 3.5 (ref 2–6.9)
POC LYMPH %: 40.7 % (ref 10–50)
POC MID %: 8.5 %M (ref 0–12)
RBC: 3.92 M/uL — AB (ref 4.04–5.48)
RDW, POC: 15 %
WBC: 6.8 10*3/uL (ref 4.6–10.2)

## 2016-06-08 LAB — COMPREHENSIVE METABOLIC PANEL
ALBUMIN: 4.5 g/dL (ref 3.5–5.0)
ALK PHOS: 59 U/L (ref 38–126)
ALT: 16 U/L (ref 14–54)
ANION GAP: 7 (ref 5–15)
AST: 26 U/L (ref 15–41)
BILIRUBIN TOTAL: 1.1 mg/dL (ref 0.3–1.2)
BUN: 15 mg/dL (ref 6–20)
CALCIUM: 9.9 mg/dL (ref 8.9–10.3)
CO2: 27 mmol/L (ref 22–32)
Chloride: 106 mmol/L (ref 101–111)
Creatinine, Ser: 1.2 mg/dL — ABNORMAL HIGH (ref 0.44–1.00)
GFR, EST AFRICAN AMERICAN: 49 mL/min — AB (ref >60)
GFR, EST NON AFRICAN AMERICAN: 42 mL/min — AB (ref >60)
GLUCOSE: 76 mg/dL (ref 65–99)
POTASSIUM: 4.2 mmol/L (ref 3.5–5.1)
Sodium: 140 mmol/L (ref 135–145)
TOTAL PROTEIN: 8 g/dL (ref 6.5–8.1)

## 2016-06-08 LAB — CBC
HEMATOCRIT: 41.9 % (ref 36.0–46.0)
HEMOGLOBIN: 13.8 g/dL (ref 12.0–15.0)
MCH: 31.9 pg (ref 26.0–34.0)
MCHC: 32.9 g/dL (ref 30.0–36.0)
MCV: 97 fL (ref 78.0–100.0)
Platelets: 187 10*3/uL (ref 150–400)
RBC: 4.32 MIL/uL (ref 3.87–5.11)
RDW: 14 % (ref 11.5–15.5)
WBC: 9.1 10*3/uL (ref 4.0–10.5)

## 2016-06-08 LAB — ABO/RH: ABO/RH(D): O POS

## 2016-06-08 LAB — POC OCCULT BLOOD, ED: Fecal Occult Bld: NEGATIVE

## 2016-06-08 LAB — GLUCOSE, POCT (MANUAL RESULT ENTRY): POC Glucose: 106 mg/dl — AB (ref 70–99)

## 2016-06-08 MED ORDER — CIPROFLOXACIN IN D5W 400 MG/200ML IV SOLN
400.0000 mg | Freq: Once | INTRAVENOUS | Status: AC
  Administered 2016-06-08: 400 mg via INTRAVENOUS
  Filled 2016-06-08: qty 200

## 2016-06-08 MED ORDER — METRONIDAZOLE 500 MG PO TABS: 500.0000 mg | ORAL_TABLET | Freq: Two times a day (BID) | ORAL | Status: DC

## 2016-06-08 MED ORDER — METRONIDAZOLE IN NACL 5-0.79 MG/ML-% IV SOLN
500.0000 mg | Freq: Once | INTRAVENOUS | Status: AC
  Administered 2016-06-08: 500 mg via INTRAVENOUS
  Filled 2016-06-08: qty 100

## 2016-06-08 MED ORDER — IOPAMIDOL (ISOVUE-300) INJECTION 61%
INTRAVENOUS | Status: AC
  Administered 2016-06-08: 100 mL
  Filled 2016-06-08: qty 100

## 2016-06-08 MED ORDER — CIPROFLOXACIN HCL 250 MG PO TABS: 250.0000 mg | ORAL_TABLET | Freq: Two times a day (BID) | ORAL | Status: DC

## 2016-06-08 MED ORDER — SODIUM CHLORIDE 0.9 % IV BOLUS (SEPSIS)
500.0000 mL | Freq: Once | INTRAVENOUS | Status: AC
  Administered 2016-06-08: 500 mL via INTRAVENOUS

## 2016-06-08 NOTE — ED Notes (Signed)
Jazmine RN and MD Zammit at bedside for rectal exam.  Patient tolerated well

## 2016-06-08 NOTE — ED Notes (Signed)
Occult Blood Card at bedside.

## 2016-06-08 NOTE — ED Notes (Signed)
Patient Alert and oriented X4. Stable and ambulatory. Patient verbalized understanding of the discharge instructions.  Patient belongings were taken by the patient.  

## 2016-06-08 NOTE — Discharge Instructions (Signed)
Follow-up with your doctor next week for recheck. Return sooner if he started having a lot of bleeding

## 2016-06-08 NOTE — ED Notes (Signed)
Pt back from ct

## 2016-06-08 NOTE — ED Notes (Signed)
Pt was seen at [facility] today for rectal bleeding that started today. States she has a hx of this. Also had her gallbladder removed on May 10th.

## 2016-06-08 NOTE — ED Provider Notes (Signed)
CSN: [id number]     Arrival date & time 06/08/16  1409 History   First MD Initiated Contact with Patient 06/08/16 1648     Chief Complaint  Patient presents with  . Rectal Bleeding     (Consider location/radiation/quality/duration/timing/severity/associated sxs/prior Treatment) Patient is a 80 y.o. female presenting with hematochezia. The history is provided by the patient (Patient states she has some rectal bleeding today no fever no vomiting no abdominal pain).  Rectal Bleeding Quality:  Bright red Amount:  Scant Timing:  Intermittent Progression:  Resolved Chronicity:  New Context: not anal fissures   Similar prior episodes: no   Associated symptoms: no abdominal pain     Past Medical History  Diagnosis Date  . Hypertension   . GERD (gastroesophageal reflux disease)     + hpylori  . Hyperlipidemia   . Anxiety   . Cancer ([place]) 15 yrs ago    ho renal s/p nephrectomy  . Diabetes mellitus   . Elevated TSH   . Elevated serum creatinine   . Ischemic colitis ([place])   . Renal insufficiency   . Colon polyps 11/2008    tubular adenoma  . Esophageal problem     esophageal dilation  . Abnormal EKG 02/07/2016    Inferolateral T wave inversion and ST depression.  . Hypothyroidism   . Headache   . Arthritis    Past Surgical History  Procedure Laterality Date  . Nephrectomy Right   . Abdominal hysterectomy  1978    partial  . Colonoscopy    . Esophageal dilation  2016  . Hernia repair    . Laparoscopic cholecystectomy  04/04/2016  . Cholecystectomy N/A 04/04/2016    Procedure: LAPAROSCOPIC CHOLECYSTECTOMY;  Surgeon: Autumn Messing III, MD;  Location: [facility] OR;  Service: General;  Laterality: N/A;   Family History  Problem Relation Age of Onset  . Cancer Mother     colon, kidney cancer  . Cancer Father     throat cancer  . Cancer Sister   . Heart attack Brother    Social History  Substance Use Topics  . Smoking status: Current Some Day Smoker -- 0.25 packs/day for 55 years     Types: Cigarettes  . Smokeless tobacco: Never Used     Comment: cut back amount still  trying to quit  . Alcohol Use: No   OB History    No data available     Review of Systems  Constitutional: Negative for appetite change and fatigue.  HENT: Negative for congestion, ear discharge and sinus pressure.   Eyes: Negative for discharge.  Respiratory: Negative for cough.   Cardiovascular: Negative for chest pain.  Gastrointestinal: Positive for hematochezia. Negative for abdominal pain and diarrhea.       Rectal bleeding  Genitourinary: Negative for frequency and hematuria.  Musculoskeletal: Negative for back pain.  Skin: Negative for rash.  Neurological: Negative for seizures and headaches.  Psychiatric/Behavioral: Negative for hallucinations.      Allergies  Ciprocin-fluocin-procin; Clonidine derivatives; Codeine; Crestor; Penicillins; Simvastatin; and Tessalon perles  Home Medications   Prior to Admission medications   Medication Sig Start Date End Date Taking? Authorizing Provider  aspirin EC 81 MG tablet Take 81 mg by mouth daily.   Yes Historical Provider, MD  cholecalciferol (VITAMIN D) 1000 units tablet Take 1,000 Units by mouth daily.   Yes Historical Provider, MD  clonazePAM (KLONOPIN) 1 MG tablet TAKE 1/2 IN THE MORNING AND 1/2 IN THE AFTERNOON AND 1 AT BEDTIME Patient  taking differently: TAKE 1/2 TABLET IN THE MORNING AND 1 TABLET AT BEDTIME, MAY ALSO TAKE 1/2 TABLET IN THE AFTERNOON AS NEEDED FOR ANXIETY 04/27/16  Yes [name], MD  Colloidal Oatmeal (GOLD BOND ULTRA ECZEMA RELIEF EX) Apply 1 application topically daily.   Yes Historical Provider, MD  diltiazem (CARTIA XT) 240 MG 24 hr capsule Take 1 capsule (240 mg total) by mouth daily. 11/29/15  Yes [name], MD  fluticasone (FLONASE) 50 MCG/ACT nasal spray Place 2 sprays into both nostrils daily. Patient taking differently: Place 1 spray into both nostrils daily as needed for allergies.  01/04/16  Yes [name], MD  furosemide (LASIX) 40 MG tablet Take 1 tablet (40 mg total) by mouth daily. 01/04/16  Yes [name], MD  glipiZIDE (GLUCOTROL XL) 10 MG 24 hr tablet TAKE 1 TABLET BY MOUTH TWICE DAILY Patient taking differently: Take 10 mg by mouth 2 (two) times daily.  03/28/16  Yes Chelle Jeffery, PA-C  levothyroxine (SYNTHROID, LEVOTHROID) 75 MCG tablet Take 1 tablet (75 mcg total) by mouth daily before breakfast. 01/04/16  Yes [name], MD  metoprolol (LOPRESSOR) 100 MG tablet TAKE 1 TABLET BY MOUTH TWICE DAILY 04/26/16  Yes [name], MD  metroNIDAZOLE (METROGEL) 0.75 % vaginal gel Place 1 Applicatorful vaginally 2 (two) times daily. 04/19/16  Yes [name], MD  omeprazole (PRILOSEC) 40 MG capsule Take 1 capsule (40 mg total) by mouth daily. Patient taking differently: Take 40 mg by mouth daily before breakfast.  01/04/16  Yes [name], MD  polyethylene glycol (MIRALAX / GLYCOLAX) packet Take 17 g by mouth daily as needed (constipation).    Yes Historical Provider, MD  vitamin E 100 UNIT capsule Take 100 Units by mouth daily.   Yes Historical Provider, MD  ciprofloxacin (CIPRO) 250 MG tablet Take 1 tablet (250 mg total) by mouth 2 (two) times daily. 06/08/16   Milton Ferguson, MD  HYDROcodone-acetaminophen (NORCO) 5-325 MG tablet Take 1-2 tablets by mouth every 6 (six) hours as needed. Patient not taking: Reported on 06/08/2016 04/04/16   Autumn Messing III, MD  HYDROcodone-acetaminophen (NORCO/VICODIN) 5-325 MG tablet Take 1-2 tablets by mouth every 4 (four) hours as needed for moderate pain. Patient not taking: Reported on 06/08/2016 04/07/16   Fanny Skates, MD  metroNIDAZOLE (FLAGYL) 500 MG tablet Take 1 tablet (500 mg total) by mouth 2 (two) times daily. One po bid x 7 days 06/08/16   Milton Ferguson, MD   BP 155/75 mmHg  Pulse 63  Temp(Src) 97.7 F (36.5 C) (Oral)  Resp 18  Ht 5\' 6"  (1.676 m)  Wt 169 lb (76.658 kg)  BMI 27.29 kg/m2  SpO2 96% Physical Exam  Constitutional: She is  oriented to person, place, and time. She appears well-developed.  HENT:  Head: Normocephalic.  Eyes: Conjunctivae and EOM are normal. No scleral icterus.  Neck: Neck supple. No thyromegaly present.  Cardiovascular: Normal rate and regular rhythm.  Exam reveals no gallop and no friction rub.   No murmur heard. Pulmonary/Chest: No stridor. She has no wheezes. She has no rales. She exhibits no tenderness.  Abdominal: She exhibits no distension. There is tenderness. There is no rebound.  Minimal left lower quadrant tenderness  Genitourinary:  Rectal exam normal with brown stool  Musculoskeletal: Normal range of motion. She exhibits no edema.  Lymphadenopathy:    She has no cervical adenopathy.  Neurological: She is oriented to person, place, and time. She exhibits  normal muscle tone. Coordination normal.  Skin: No rash noted. No erythema.  Psychiatric: She has a normal mood and affect. Her behavior is normal.    ED Course  Procedures (including critical care time) Labs Review Labs Reviewed  COMPREHENSIVE METABOLIC PANEL - Abnormal; Notable for the following:    Creatinine, Ser 1.20 (*)    GFR calc non Af Amer 42 (*)    GFR calc Af Amer 49 (*)    All other components within normal limits  CBC  POC OCCULT BLOOD, ED  TYPE AND SCREEN  ABO/RH    Imaging Review Ct Abdomen Pelvis W Contrast  06/08/2016  CLINICAL DATA:  Blood in stool today. Personal history of ischemic colitis. Renal insufficiency. EXAM: CT ABDOMEN AND PELVIS WITH CONTRAST TECHNIQUE: Multidetector CT imaging of the abdomen and pelvis was performed using the standard protocol following bolus administration of intravenous contrast. CONTRAST:  100 ISOVUE-300 IOPAMIDOL (ISOVUE-300) INJECTION 61% COMPARISON:  CT of the abdomen and pelvis 02/02/2016. FINDINGS: Lower chest: Mild dependent atelectasis is present at the lung bases bilaterally. The heart size is normal. No significant pleural or pericardial effusion is present.  Hepatobiliary: There is diffuse fatty infiltration of the liver. A focal subcapsular lipoma is again seen at the inferior aspect of the right lobe of the liver. No other focal hepatic lesions are present. Cholecystectomy is noted. The common bile duct is within normal limits. Pancreas: Within normal limits. Spleen: Unremarkable Adrenals/Urinary Tract: The adrenal glands are normal bilaterally. Right nephrectomy is noted. The left kidney in ureter are unremarkable. The urinary bladder is within normal limits. Stomach/Bowel: The stomach is within normal limits. A duodenal diverticulum is again noted. The small bowel is otherwise unremarkable. The appendix is seen deep within the anatomic pelvis and is within normal limits. The ascending and transverse colon are within normal limits. The descending colon is unremarkable. Diverticular changes are again noted about the sigmoid colon. There is slight stranding within the sigmoid mesocolon suggesting mild diverticulitis. No discrete abscess or perforation is present. The distal rectum is within normal limits. Vascular/Lymphatic: Atherosclerotic calcifications are present in the aorta and branch vessels without aneurysm. No significant adenopathy is present. Reproductive: Hysterectomy is noted. Adnexa are within normal limits for age. Other: No significant free fluid is present. Musculoskeletal: Slight degenerative retrolisthesis is present at L2-3. Grade 1 anterolisthesis is present at L4-5. Changes of DISH are again noted. Vertebral body heights are maintained. No focal lytic or blastic lesions are present. The pelvis is intact. IMPRESSION: 1. Sigmoid diverticula with mild inflammatory changes suggesting diverticulitis. 2. No evidence for perforation or abscess. 3. Interval cholecystectomy. 4. Other incidental findings are stable. Electronically Signed   By: San Morelle M.D.   On: 06/08/2016 19:39   I have personally reviewed and evaluated these images and lab  results as part of my medical decision-making.   EKG Interpretation None      MDM   Final diagnoses:  Diverticulitis of large intestine with perforation with bleeding    Patient with diverticulitis patient is nontoxic she will be sent home on antibiotics will follow-up with her pcp    Milton Ferguson, MD 06/08/16 2022

## 2016-06-08 NOTE — ED Notes (Signed)
MD at bedside. 

## 2016-06-08 NOTE — Progress Notes (Addendum)
By signing my name below, I, Mesha Guinyard, attest that this documentation has been prepared under the direction and in the presence of Arlyss Queen, MD.  Electronically Signed: Verlee Monte, Medical Scribe. 06/08/2016. 12:16 PM.  Chief Complaint:  Chief Complaint  Patient presents with  . Rectal Bleeding    bloody stools    HPI: Sherri Burns is a 80 y.o. female who reports to [facility] today complaining of rectal bleeding onset a couple of hours ago. Pt had a normal bm this morning and while still on the commode, she felt something pouring out her rectum. Pt got up from the commode to see what came out and saw the toilet was full of dark red blood- the blood wasn't black, or bright red. Pt states when she went to wipe herself, the toilet paper was drenched with blood. Pt reports lower right back pain, and suprapubic pain this morning. Pt states she was fine yesterday when her nurse came to check her vitals. Pt denies melena.  Pt states her lights were cut off, and her hot water heater was messed up.  Past Medical History  Diagnosis Date  . Hypertension   . GERD (gastroesophageal reflux disease)     + hpylori  . Hyperlipidemia   . Anxiety   . Cancer ([place]) 15 yrs ago    ho renal s/p nephrectomy  . Diabetes mellitus   . Elevated TSH   . Elevated serum creatinine   . Ischemic colitis ([place])   . Renal insufficiency   . Colon polyps 11/2008    tubular adenoma  . Esophageal problem     esophageal dilation  . Abnormal EKG 02/07/2016    Inferolateral T wave inversion and ST depression.  . Hypothyroidism   . Headache   . Arthritis    Past Surgical History  Procedure Laterality Date  . Nephrectomy Right   . Abdominal hysterectomy  1978    partial  . Colonoscopy    . Esophageal dilation  2016  . Hernia repair    . Laparoscopic cholecystectomy  04/04/2016  . Cholecystectomy N/A 04/04/2016    Procedure: LAPAROSCOPIC CHOLECYSTECTOMY;  Surgeon: Autumn Messing III, MD;  Location: [place];   Service: General;  Laterality: N/A;   Social History   Social History  . Marital Status: Widowed    Spouse Name: N/A  . Number of Children: N/A  . Years of Education: N/A   Social History Main Topics  . Smoking status: Current Some Day Smoker -- 0.25 packs/day for 55 years    Types: Cigarettes  . Smokeless tobacco: Never Used     Comment: cut back amount still  trying to quit  . Alcohol Use: No  . Drug Use: No  . Sexual Activity: Yes   Other Topics Concern  . None   Social History Narrative   Loss of son.   Family History  Problem Relation Age of Onset  . Cancer Mother     colon, kidney cancer  . Cancer Father     throat cancer  . Cancer Sister   . Heart attack Brother    Allergies  Allergen Reactions  . Ciprocin-Fluocin-Procin [Fluocinolone Acetonide]     unknown  . Clonidine Derivatives     Dry mouth  . Codeine     hallucinations  . Crestor [Rosuvastatin Calcium]     cramps  . Penicillins     Has patient had a PCN reaction causing immediate rash, facial/tongue/throat swelling, SOB or lightheadedness with  hypotension: no Has patient had a PCN reaction causing severe rash involving mucus membranes or skin necrosis: no Has patient had a PCN reaction that required hospitalization : no Has patient had a PCN reaction occurring within the last 10 years: no -Hallucinates If all of the above answers are "NO", then may proceed with Cephalosporin use.   . Simvastatin     Upset stomach   . Tessalon Perles     Gi upset    Prior to Admission medications   Medication Sig Start Date End Date Taking? Authorizing Provider  aspirin 81 MG tablet Take 81 mg by mouth daily.   Yes Historical Provider, MD  Cholecalciferol (VITAMIN D-3 PO) Take 1 tablet by mouth daily.   Yes Historical Provider, MD  clonazePAM (KLONOPIN) 1 MG tablet TAKE 1/2 IN THE MORNING AND 1/2 IN THE AFTERNOON AND 1 AT BEDTIME 04/27/16  Yes [name], MD  diltiazem (CARTIA XT) 240 MG 24 hr capsule Take  1 capsule (240 mg total) by mouth daily. 11/29/15  Yes [name], MD  fluticasone (FLONASE) 50 MCG/ACT nasal spray Place 2 sprays into both nostrils daily. Patient taking differently: Place 1 spray into both nostrils daily as needed for allergies.  01/04/16  Yes [name], MD  furosemide (LASIX) 40 MG tablet Take 1 tablet (40 mg total) by mouth daily. 01/04/16  Yes [name], MD  glipiZIDE (GLUCOTROL XL) 10 MG 24 hr tablet TAKE 1 TABLET BY MOUTH TWICE DAILY 03/28/16  Yes Chelle Jeffery, PA-C  levothyroxine (SYNTHROID, LEVOTHROID) 75 MCG tablet Take 1 tablet (75 mcg total) by mouth daily before breakfast. 01/04/16  Yes [name], MD  metoprolol (LOPRESSOR) 100 MG tablet TAKE 1 TABLET BY MOUTH TWICE DAILY 04/26/16  Yes [name], MD  metroNIDAZOLE (METROGEL) 0.75 % vaginal gel Place 1 Applicatorful vaginally 2 (two) times daily. 04/19/16  Yes [name], MD  omeprazole (PRILOSEC) 40 MG capsule Take 1 capsule (40 mg total) by mouth daily. 01/04/16  Yes [name], MD  polyethylene glycol (MIRALAX / GLYCOLAX) packet Take 17 g by mouth daily.   Yes Historical Provider, MD  vitamin E 100 UNIT capsule Take 100 Units by mouth daily.   Yes Historical Provider, MD  HYDROcodone-acetaminophen (NORCO) 5-325 MG tablet Take 1-2 tablets by mouth every 6 (six) hours as needed. Patient not taking: Reported on 06/08/2016 04/04/16   Autumn Messing III, MD  HYDROcodone-acetaminophen (NORCO/VICODIN) 5-325 MG tablet Take 1-2 tablets by mouth every 4 (four) hours as needed for moderate pain. Patient not taking: Reported on 06/08/2016 04/07/16   Fanny Skates, MD     ROS: The patient denies fevers, chills, night sweats, unintentional weight loss, chest pain, palpitations, wheezing, dyspnea on exertion, nausea, vomiting, dysuria, hematuria, melena, numbness, weakness, or tingling. +rectal bleeding +abdominal pain  All other systems have been reviewed and were otherwise negative with the exception of those mentioned in  the HPI and as above.    PHYSICAL EXAM: Filed Vitals:   06/08/16 1151  BP: 124/80  Pulse: 66  Temp: 98 F (36.7 C)  Resp: 18   Body mass index is 28.12 kg/(m^2).   General: Alert, no acute distress HEENT:  Normocephalic, atraumatic, oropharynx patent. Eye: [facility] Cardiovascular:  Regular rate and rhythm, no rubs murmurs or gallops.  No Carotid bruits, radial pulse intact. No pedal edema.  Respiratory: Clear to auscultation bilaterally.  No wheezes, rales, or rhonchi.  No cyanosis, no use of accessory  musculature Abdominal: No organomegaly, abdomen is soft, positive bowel sounds.  No masses.Diffuse abdominal discomfort, most significantly LLQ Musculoskeletal: Gait intact. No edema, tenderness Skin: No rashes. Healed upper abdominal scar Neurologic: Facial musculature symmetric. Psychiatric: Patient acts appropriately throughout our interaction. Lymphatic: No cervical or submandibular lymphadenopathy Rectal: no bright red blood obtained. No masses felt.  LABS: Results for orders placed or performed in visit on 06/08/16  POCT CBC  Result Value Ref Range   WBC 6.8 4.6 - 10.2 K/uL   Lymph, poc 2.8 0.6 - 3.4   POC LYMPH PERCENT 40.7 10 - 50 %L   MID (cbc) 0.6 0 - 0.9   POC MID % 8.5 0 - 12 %M   POC Granulocyte 3.5 2 - 6.9   Granulocyte percent 50.8 37 - 80 %G   RBC 3.92 (A) 4.04 - 5.48 M/uL   Hemoglobin 12.8 12.2 - 16.2 g/dL   HCT, POC 37.1 (A) 37.7 - 47.9 %   MCV 94.8 80 - 97 fL   MCH, POC 32.7 (A) 27 - 31.2 pg   MCHC 34.5 31.8 - 35.4 g/dL   RDW, POC 15.0 %   Platelet Count, POC 149 142 - 424 K/uL   MPV 7.8 0 - 99.8 fL  POCT glucose (manual entry)  Result Value Ref Range   POC Glucose 106 (A) 70 - 99 mg/dl   Results for orders placed or performed in visit on 06/08/16  POCT CBC  Result Value Ref Range   WBC 6.8 4.6 - 10.2 K/uL   Lymph, poc 2.8 0.6 - 3.4   POC LYMPH PERCENT 40.7 10 - 50 %L   MID (cbc) 0.6 0 - 0.9   POC MID % 8.5 0 - 12 %M   POC Granulocyte  3.5 2 - 6.9   Granulocyte percent 50.8 37 - 80 %G   RBC 3.92 (A) 4.04 - 5.48 M/uL   Hemoglobin 12.8 12.2 - 16.2 g/dL   HCT, POC 37.1 (A) 37.7 - 47.9 %   MCV 94.8 80 - 97 fL   MCH, POC 32.7 (A) 27 - 31.2 pg   MCHC 34.5 31.8 - 35.4 g/dL   RDW, POC 15.0 %   Platelet Count, POC 149 142 - 424 K/uL   MPV 7.8 0 - 99.8 fL  POCT glucose (manual entry)  Result Value Ref Range   POC Glucose 106 (A) 70 - 99 mg/dl    EKG/XRAY:   Primary read interpreted by Dr. Everlene Farrier at [facility].   ASSESSMENT/PLAN:  Patient to go to the emergency room for evaluation of her diffuse abdominal pain and rectal bleeding. She is status post nephrectomy and has diabetes and will not be able to receive IV contrast. She did have a CT abdominal pelvis in March. She is known to have extensive diverticulosis.Patient did have 1 bowel movement prior to leaving that did not show a lot of blood. See continues to have severe cramping and tenesmus .I personally performed the services described in this documentation, which was scribed in my presence. The recorded information has been reviewed and is accurate.  Gross sideeffects, risk and benefits, and alternatives of medications d/w patient. Patient is aware that all medications have potential sideeffects and we are unable to predict every sideeffect or drug-drug interaction that may occur.  Arlyss Queen MD 06/08/2016 12:16 PM

## 2016-06-08 NOTE — ED Notes (Signed)
Per Dr Stan Head large bowel movement this am-bright red blood followed by large amount of dark stool-history of diverticulitis

## 2016-06-08 NOTE — Patient Instructions (Signed)
     IF you received an x-ray today, you will receive an invoice from [facility]. Please contact [facility] at [phone] with questions or concerns regarding your invoice.   IF you received labwork today, you will receive an invoice from [facility]. Please contact [facility] at [phone] with questions or concerns regarding your invoice.   Our billing staff will not be able to assist you with questions regarding bills from these companies.  You will be contacted with the lab results as soon as they are available. The fastest way to get your results is to activate your My Chart account. Instructions are located on the last page of this paperwork. If you have not heard from us regarding the results in 2 weeks, please contact this office.      

## 2016-06-09 LAB — POC HEMOCCULT BLD/STL (OFFICE/1-CARD/DIAGNOSTIC): Fecal Occult Blood, POC: POSITIVE — AB

## 2016-06-15 ENCOUNTER — Other Ambulatory Visit: Payer: Self-pay

## 2016-06-15 DIAGNOSIS — N189 Chronic kidney disease, unspecified: Secondary | ICD-10-CM | POA: Diagnosis not present

## 2016-06-15 DIAGNOSIS — E1122 Type 2 diabetes mellitus with diabetic chronic kidney disease: Secondary | ICD-10-CM | POA: Diagnosis not present

## 2016-06-15 DIAGNOSIS — I129 Hypertensive chronic kidney disease with stage 1 through stage 4 chronic kidney disease, or unspecified chronic kidney disease: Secondary | ICD-10-CM | POA: Diagnosis not present

## 2016-06-15 DIAGNOSIS — Z48815 Encounter for surgical aftercare following surgery on the digestive system: Secondary | ICD-10-CM | POA: Diagnosis not present

## 2016-06-15 DIAGNOSIS — E785 Hyperlipidemia, unspecified: Secondary | ICD-10-CM | POA: Diagnosis not present

## 2016-06-15 DIAGNOSIS — K219 Gastro-esophageal reflux disease without esophagitis: Secondary | ICD-10-CM | POA: Diagnosis not present

## 2016-06-15 MED ORDER — CLONAZEPAM 1 MG PO TABS: ORAL_TABLET | ORAL | Status: DC

## 2016-06-15 NOTE — Telephone Encounter (Signed)
Called in Rx to [facility] and gave it to pharmacist.

## 2016-06-15 NOTE — Telephone Encounter (Signed)
Kim from [facility] is calling to check the status of pt's clonazepam refill she called about last week. I apologized to her that it doesn't look like the message was put in the system, but that Dr Everlene Farrier is here today and I will go speak to him right away so that this can be sent today since pt is out of medication. Dr Everlene Farrier, pended Rx w/RFs.

## 2016-06-21 ENCOUNTER — Other Ambulatory Visit: Payer: Self-pay | Admitting: Physician Assistant

## 2016-06-21 DIAGNOSIS — E785 Hyperlipidemia, unspecified: Secondary | ICD-10-CM | POA: Diagnosis not present

## 2016-06-21 DIAGNOSIS — E1122 Type 2 diabetes mellitus with diabetic chronic kidney disease: Secondary | ICD-10-CM | POA: Diagnosis not present

## 2016-06-21 DIAGNOSIS — I129 Hypertensive chronic kidney disease with stage 1 through stage 4 chronic kidney disease, or unspecified chronic kidney disease: Secondary | ICD-10-CM | POA: Diagnosis not present

## 2016-06-21 DIAGNOSIS — N189 Chronic kidney disease, unspecified: Secondary | ICD-10-CM | POA: Diagnosis not present

## 2016-06-21 DIAGNOSIS — K219 Gastro-esophageal reflux disease without esophagitis: Secondary | ICD-10-CM | POA: Diagnosis not present

## 2016-06-21 DIAGNOSIS — Z48815 Encounter for surgical aftercare following surgery on the digestive system: Secondary | ICD-10-CM | POA: Diagnosis not present

## 2016-06-22 NOTE — Telephone Encounter (Signed)
I don't see that I have every seen this pt before. She follows closely with Dr. Everlene Farrier and her cardiologist.  Dennis Bast could probably refill this using the refill protocol guidelines.

## 2016-06-27 ENCOUNTER — Telehealth: Payer: Self-pay

## 2016-06-27 DIAGNOSIS — I129 Hypertensive chronic kidney disease with stage 1 through stage 4 chronic kidney disease, or unspecified chronic kidney disease: Secondary | ICD-10-CM | POA: Diagnosis not present

## 2016-06-27 DIAGNOSIS — N189 Chronic kidney disease, unspecified: Secondary | ICD-10-CM | POA: Diagnosis not present

## 2016-06-27 DIAGNOSIS — E1122 Type 2 diabetes mellitus with diabetic chronic kidney disease: Secondary | ICD-10-CM | POA: Diagnosis not present

## 2016-06-27 DIAGNOSIS — K219 Gastro-esophageal reflux disease without esophagitis: Secondary | ICD-10-CM | POA: Diagnosis not present

## 2016-06-27 DIAGNOSIS — E785 Hyperlipidemia, unspecified: Secondary | ICD-10-CM | POA: Diagnosis not present

## 2016-06-27 DIAGNOSIS — Z48815 Encounter for surgical aftercare following surgery on the digestive system: Secondary | ICD-10-CM | POA: Diagnosis not present

## 2016-06-27 NOTE — Telephone Encounter (Signed)
Please give the okay to recertify skilled nursing.

## 2016-06-27 NOTE — Telephone Encounter (Signed)
Twana First is calling from [facility] to request an order to re certify skilled nursing for the patient. Please call! [phone]

## 2016-06-28 NOTE — Telephone Encounter (Signed)
Tiana Loft ok for skilled nursing.

## 2016-06-29 ENCOUNTER — Telehealth: Payer: Self-pay

## 2016-06-29 DIAGNOSIS — R739 Hyperglycemia, unspecified: Secondary | ICD-10-CM

## 2016-06-29 MED ORDER — GLIPIZIDE ER 10 MG PO TB24: ORAL_TABLET | ORAL | 0 refills | Status: DC

## 2016-06-29 NOTE — Addendum Note (Signed)
Addended by: Jannette Spanner on: 06/29/2016 04:28 PM   Modules accepted: Orders

## 2016-06-29 NOTE — Telephone Encounter (Signed)
R sent

## 2016-06-29 NOTE — Telephone Encounter (Signed)
Patient is changing pharmacy and  Needs a script sent to [facility]  Of glipiZIDE (GLUCOTROL XL) 10 MG 24 hr tablet TAKE 1 TABLET BY MOUTH TWICE DAILY, Normal

## 2016-07-02 DIAGNOSIS — E785 Hyperlipidemia, unspecified: Secondary | ICD-10-CM | POA: Diagnosis not present

## 2016-07-02 DIAGNOSIS — Z48815 Encounter for surgical aftercare following surgery on the digestive system: Secondary | ICD-10-CM | POA: Diagnosis not present

## 2016-07-02 DIAGNOSIS — N189 Chronic kidney disease, unspecified: Secondary | ICD-10-CM | POA: Diagnosis not present

## 2016-07-02 DIAGNOSIS — K219 Gastro-esophageal reflux disease without esophagitis: Secondary | ICD-10-CM | POA: Diagnosis not present

## 2016-07-02 DIAGNOSIS — I129 Hypertensive chronic kidney disease with stage 1 through stage 4 chronic kidney disease, or unspecified chronic kidney disease: Secondary | ICD-10-CM | POA: Diagnosis not present

## 2016-07-02 DIAGNOSIS — E1122 Type 2 diabetes mellitus with diabetic chronic kidney disease: Secondary | ICD-10-CM | POA: Diagnosis not present

## 2016-07-02 DIAGNOSIS — F419 Anxiety disorder, unspecified: Secondary | ICD-10-CM | POA: Diagnosis not present

## 2016-07-06 DIAGNOSIS — E1122 Type 2 diabetes mellitus with diabetic chronic kidney disease: Secondary | ICD-10-CM | POA: Diagnosis not present

## 2016-07-06 DIAGNOSIS — K219 Gastro-esophageal reflux disease without esophagitis: Secondary | ICD-10-CM | POA: Diagnosis not present

## 2016-07-06 DIAGNOSIS — Z48815 Encounter for surgical aftercare following surgery on the digestive system: Secondary | ICD-10-CM | POA: Diagnosis not present

## 2016-07-06 DIAGNOSIS — N189 Chronic kidney disease, unspecified: Secondary | ICD-10-CM | POA: Diagnosis not present

## 2016-07-06 DIAGNOSIS — I129 Hypertensive chronic kidney disease with stage 1 through stage 4 chronic kidney disease, or unspecified chronic kidney disease: Secondary | ICD-10-CM | POA: Diagnosis not present

## 2016-07-06 DIAGNOSIS — E785 Hyperlipidemia, unspecified: Secondary | ICD-10-CM | POA: Diagnosis not present

## 2016-07-06 DIAGNOSIS — F419 Anxiety disorder, unspecified: Secondary | ICD-10-CM | POA: Diagnosis not present

## 2016-07-13 DIAGNOSIS — F419 Anxiety disorder, unspecified: Secondary | ICD-10-CM | POA: Diagnosis not present

## 2016-07-13 DIAGNOSIS — Z48815 Encounter for surgical aftercare following surgery on the digestive system: Secondary | ICD-10-CM | POA: Diagnosis not present

## 2016-07-13 DIAGNOSIS — N189 Chronic kidney disease, unspecified: Secondary | ICD-10-CM | POA: Diagnosis not present

## 2016-07-13 DIAGNOSIS — I129 Hypertensive chronic kidney disease with stage 1 through stage 4 chronic kidney disease, or unspecified chronic kidney disease: Secondary | ICD-10-CM | POA: Diagnosis not present

## 2016-07-13 DIAGNOSIS — K219 Gastro-esophageal reflux disease without esophagitis: Secondary | ICD-10-CM | POA: Diagnosis not present

## 2016-07-13 DIAGNOSIS — E785 Hyperlipidemia, unspecified: Secondary | ICD-10-CM | POA: Diagnosis not present

## 2016-07-13 DIAGNOSIS — E1122 Type 2 diabetes mellitus with diabetic chronic kidney disease: Secondary | ICD-10-CM | POA: Diagnosis not present

## 2016-07-20 DIAGNOSIS — F419 Anxiety disorder, unspecified: Secondary | ICD-10-CM | POA: Diagnosis not present

## 2016-07-20 DIAGNOSIS — E785 Hyperlipidemia, unspecified: Secondary | ICD-10-CM | POA: Diagnosis not present

## 2016-07-20 DIAGNOSIS — N189 Chronic kidney disease, unspecified: Secondary | ICD-10-CM | POA: Diagnosis not present

## 2016-07-20 DIAGNOSIS — K219 Gastro-esophageal reflux disease without esophagitis: Secondary | ICD-10-CM | POA: Diagnosis not present

## 2016-07-20 DIAGNOSIS — E1122 Type 2 diabetes mellitus with diabetic chronic kidney disease: Secondary | ICD-10-CM | POA: Diagnosis not present

## 2016-07-20 DIAGNOSIS — Z48815 Encounter for surgical aftercare following surgery on the digestive system: Secondary | ICD-10-CM | POA: Diagnosis not present

## 2016-07-20 DIAGNOSIS — I129 Hypertensive chronic kidney disease with stage 1 through stage 4 chronic kidney disease, or unspecified chronic kidney disease: Secondary | ICD-10-CM | POA: Diagnosis not present

## 2016-07-23 ENCOUNTER — Other Ambulatory Visit: Payer: Self-pay | Admitting: Emergency Medicine

## 2016-07-28 DIAGNOSIS — E119 Type 2 diabetes mellitus without complications: Secondary | ICD-10-CM | POA: Diagnosis not present

## 2016-08-16 ENCOUNTER — Telehealth: Payer: Self-pay | Admitting: General Surgery

## 2016-08-16 ENCOUNTER — Encounter: Payer: Self-pay | Admitting: General Surgery

## 2016-08-16 NOTE — Telephone Encounter (Signed)
A user error has taken place.

## 2016-08-22 ENCOUNTER — Ambulatory Visit: Payer: Medicare Other

## 2016-09-01 ENCOUNTER — Emergency Department (HOSPITAL_COMMUNITY)
Admission: EM | Admit: 2016-09-01 | Discharge: 2016-09-01 | Disposition: A | Discharge status: Home / Self Care | Payer: Medicare Other | Attending: Emergency Medicine | Admitting: Emergency Medicine

## 2016-09-01 ENCOUNTER — Emergency Department (HOSPITAL_COMMUNITY): Payer: Medicare Other

## 2016-09-01 ENCOUNTER — Encounter (HOSPITAL_COMMUNITY): Payer: Self-pay | Admitting: *Deleted

## 2016-09-01 DIAGNOSIS — E1140 Type 2 diabetes mellitus with diabetic neuropathy, unspecified: Secondary | ICD-10-CM | POA: Insufficient documentation

## 2016-09-01 DIAGNOSIS — N183 Chronic kidney disease, stage 3 (moderate): Secondary | ICD-10-CM | POA: Insufficient documentation

## 2016-09-01 DIAGNOSIS — Z7984 Long term (current) use of oral hypoglycemic drugs: Secondary | ICD-10-CM | POA: Diagnosis not present

## 2016-09-01 DIAGNOSIS — Z85528 Personal history of other malignant neoplasm of kidney: Secondary | ICD-10-CM | POA: Insufficient documentation

## 2016-09-01 DIAGNOSIS — R1084 Generalized abdominal pain: Secondary | ICD-10-CM | POA: Insufficient documentation

## 2016-09-01 DIAGNOSIS — F17210 Nicotine dependence, cigarettes, uncomplicated: Secondary | ICD-10-CM | POA: Insufficient documentation

## 2016-09-01 DIAGNOSIS — R109 Unspecified abdominal pain: Secondary | ICD-10-CM

## 2016-09-01 DIAGNOSIS — Z7982 Long term (current) use of aspirin: Secondary | ICD-10-CM | POA: Insufficient documentation

## 2016-09-01 DIAGNOSIS — R110 Nausea: Secondary | ICD-10-CM

## 2016-09-01 DIAGNOSIS — E1122 Type 2 diabetes mellitus with diabetic chronic kidney disease: Secondary | ICD-10-CM | POA: Diagnosis not present

## 2016-09-01 DIAGNOSIS — E039 Hypothyroidism, unspecified: Secondary | ICD-10-CM | POA: Insufficient documentation

## 2016-09-01 DIAGNOSIS — I129 Hypertensive chronic kidney disease with stage 1 through stage 4 chronic kidney disease, or unspecified chronic kidney disease: Secondary | ICD-10-CM | POA: Insufficient documentation

## 2016-09-01 DIAGNOSIS — R1011 Right upper quadrant pain: Secondary | ICD-10-CM | POA: Diagnosis not present

## 2016-09-01 LAB — CBC WITH DIFFERENTIAL/PLATELET
Basophils Absolute: 0 10*3/uL (ref 0.0–0.1)
Basophils Relative: 0 %
Eosinophils Absolute: 0.3 10*3/uL (ref 0.0–0.7)
Eosinophils Relative: 4 %
HCT: 40.1 % (ref 36.0–46.0)
Hemoglobin: 13 g/dL (ref 12.0–15.0)
Lymphocytes Relative: 46 %
Lymphs Abs: 3 10*3/uL (ref 0.7–4.0)
MCH: 31.7 pg (ref 26.0–34.0)
MCHC: 32.4 g/dL (ref 30.0–36.0)
MCV: 97.8 fL (ref 78.0–100.0)
Monocytes Absolute: 0.4 10*3/uL (ref 0.1–1.0)
Monocytes Relative: 6 %
Neutro Abs: 3 10*3/uL (ref 1.7–7.7)
Neutrophils Relative %: 44 %
Platelets: 193 10*3/uL (ref 150–400)
RBC: 4.1 MIL/uL (ref 3.87–5.11)
RDW: 14 % (ref 11.5–15.5)
WBC: 6.7 10*3/uL (ref 4.0–10.5)

## 2016-09-01 LAB — COMPREHENSIVE METABOLIC PANEL
ALT: 18 U/L (ref 14–54)
AST: 37 U/L (ref 15–41)
Albumin: 3.9 g/dL (ref 3.5–5.0)
Alkaline Phosphatase: 52 U/L (ref 38–126)
Anion gap: 6 (ref 5–15)
BUN: 16 mg/dL (ref 6–20)
CO2: 28 mmol/L (ref 22–32)
Calcium: 9.4 mg/dL (ref 8.9–10.3)
Chloride: 106 mmol/L (ref 101–111)
Creatinine, Ser: 1.21 mg/dL — ABNORMAL HIGH (ref 0.44–1.00)
GFR calc Af Amer: 48 mL/min — ABNORMAL LOW (ref >60)
GFR calc non Af Amer: 42 mL/min — ABNORMAL LOW (ref >60)
Glucose, Bld: 124 mg/dL — ABNORMAL HIGH (ref 65–99)
Potassium: 5.2 mmol/L — ABNORMAL HIGH (ref 3.5–5.1)
Sodium: 140 mmol/L (ref 135–145)
Total Bilirubin: 1.7 mg/dL — ABNORMAL HIGH (ref 0.3–1.2)
Total Protein: 6.7 g/dL (ref 6.5–8.1)

## 2016-09-01 LAB — URINALYSIS, ROUTINE W REFLEX MICROSCOPIC
Bilirubin Urine: NEGATIVE
Glucose, UA: NEGATIVE mg/dL
Hgb urine dipstick: NEGATIVE
Ketones, ur: NEGATIVE mg/dL
Leukocytes, UA: NEGATIVE
Nitrite: NEGATIVE
Protein, ur: NEGATIVE mg/dL
Specific Gravity, Urine: 1.01 (ref 1.005–1.030)
pH: 6 (ref 5.0–8.0)

## 2016-09-01 LAB — LIPASE, BLOOD: Lipase: 33 U/L (ref 11–51)

## 2016-09-01 LAB — TROPONIN I: Troponin I: <0.03 ng/mL (ref <0.03)

## 2016-09-01 MED ORDER — ONDANSETRON HCL 4 MG PO TABS: 4.0000 mg | ORAL_TABLET | Freq: Four times a day (QID) | ORAL | 0 refills | Status: DC

## 2016-09-01 MED ORDER — IOPAMIDOL (ISOVUE-300) INJECTION 61%
INTRAVENOUS | Status: AC
  Administered 2016-09-01: 100 mL
  Filled 2016-09-01: qty 100

## 2016-09-01 MED ORDER — SUCRALFATE 1 GM/10ML PO SUSP: 1.0000 g | Freq: Three times a day (TID) | ORAL | 0 refills | Status: DC

## 2016-09-01 NOTE — ED Provider Notes (Signed)
[facility] DEPT Provider Note   CSN: [id number] Arrival date & time: 09/01/16  0747     History   Chief Complaint Chief Complaint  Patient presents with  . Abdominal Pain    HPI Sherri Burns is a 80 y.o. female with history of cholecystectomy in May, diverticulitis, chronic low back pain, diabetes who presents diffuse abdominal pain and bloating that has been intermittent since her cholecystectomy. The pain is diffuse, however she does have a throbbing pain in her right upper quadrant. Patient has had associated nausea. Patient was seen in July and diagnosed with diverticulitis. Patient states her abdominal pain resolved, however it has returned. Patient has had associated night sweating intermittently, but no fevers. Patient does not have A/C, however. Patient reports that she has had 2 episodes of fleeting sharp chest pain, most recently yesterday. She denies chest pain now. She also denies any Fevers, shortness of breath, urinary symptoms, diarrhea, bloody stools, bowel/bladder incontinence, saddle anesthesia. She also reports worsening lower back pain, however patient does have a history of this. Patient has had a burning pain to a small area where she removed a tick from her posterior, medial thigh recently. She has intermittent associated warmth to her posterior thigh. She is ambulatory.  HPI  Past Medical History:  Diagnosis Date  . Abnormal EKG 02/07/2016   Inferolateral T wave inversion and ST depression.  Marland Kitchen Anxiety   . Arthritis   . Cancer ([place]) 15 yrs ago   ho renal s/p nephrectomy  . Colon polyps 11/2008   tubular adenoma  . Diabetes mellitus   . Elevated serum creatinine   . Elevated TSH   . Esophageal problem    esophageal dilation  . GERD (gastroesophageal reflux disease)    + hpylori  . Headache   . Hyperlipidemia   . Hypertension   . Hypothyroidism   . Ischemic colitis ([place])   . Renal insufficiency     Patient Active Problem List   Diagnosis Date  Noted  . Abnormal EKG 02/07/2016  . Acute cholecystitis   . Acute calculous cholecystitis   . Cholecystitis 12/29/2015  . Type 2 diabetes mellitus with diabetic polyneuropathy, without long-term current use of insulin ([place]) 09/23/2012  . Essential hypertension   . Hypothyroidism   . CKD (chronic kidney disease), stage III     Past Surgical History:  Procedure Laterality Date  . ABDOMINAL HYSTERECTOMY  1978   partial  . CHOLECYSTECTOMY N/A 04/04/2016   Procedure: LAPAROSCOPIC CHOLECYSTECTOMY;  Surgeon: Autumn Messing III, MD;  Location: [facility];  Service: General;  Laterality: N/A;  . COLONOSCOPY    . ESOPHAGEAL DILATION  2016  . HERNIA REPAIR    . LAPAROSCOPIC CHOLECYSTECTOMY  04/04/2016  . NEPHRECTOMY Right     OB History    No data available       Home Medications    Prior to Admission medications   Medication Sig Start Date End Date Taking? Authorizing Provider  aspirin EC 81 MG tablet Take 81 mg by mouth daily.   Yes Historical Provider, MD  cholecalciferol (VITAMIN D) 1000 units tablet Take 1,000 Units by mouth daily.   Yes Historical Provider, MD  clonazePAM (KLONOPIN) 1 MG tablet TAKE 1/2 IN THE MORNING AND 1/2 IN THE AFTERNOON AND 1 AT BEDTIME 06/15/16  Yes [name], MD  Colloidal Oatmeal (GOLD BOND ULTRA ECZEMA RELIEF EX) Apply 1 application topically daily.   Yes Historical Provider, MD  diltiazem (CARTIA XT) 240 MG 24 hr  capsule Take 1 capsule (240 mg total) by mouth daily. 11/29/15  Yes [name], MD  fluticasone (FLONASE) 50 MCG/ACT nasal spray Place 2 sprays into both nostrils daily. Patient taking differently: Place 2 sprays into both nostrils daily as needed for allergies.  01/04/16  Yes [name], MD  furosemide (LASIX) 40 MG tablet Take 1 tablet (40 mg total) by mouth daily. 01/04/16  Yes [name], MD  glipiZIDE (GLUCOTROL XL) 10 MG 24 hr tablet TAKE 1 TABLET BY MOUTH TWICE DAILY 06/29/16  Yes [name], MD  levothyroxine (SYNTHROID, LEVOTHROID) 75 MCG  tablet Take 1 tablet (75 mcg total) by mouth daily before breakfast. 01/04/16  Yes [name], MD  losartan (COZAAR) 25 MG tablet Take 25 mg by mouth daily.   Yes Historical Provider, MD  metoprolol (LOPRESSOR) 100 MG tablet TAKE 1 TABLET BY MOUTH TWICE A DAY 06/24/16  Yes [name], MD  omeprazole (PRILOSEC) 40 MG capsule Take 1 capsule (40 mg total) by mouth daily. Patient taking differently: Take 40 mg by mouth daily before breakfast.  01/04/16  Yes [name], MD  polyethylene glycol (MIRALAX / GLYCOLAX) packet Take 8.5 g by mouth every morning.    Yes Historical Provider, MD  vitamin E 100 UNIT capsule Take 100 Units by mouth daily.   Yes Historical Provider, MD  HYDROcodone-acetaminophen (NORCO) 5-325 MG tablet Take 1-2 tablets by mouth every 6 (six) hours as needed. Patient not taking: Reported on 09/01/2016 04/04/16   Autumn Messing III, MD  HYDROcodone-acetaminophen (NORCO/VICODIN) 5-325 MG tablet Take 1-2 tablets by mouth every 4 (four) hours as needed for moderate pain. Patient not taking: Reported on 09/01/2016 04/07/16   Fanny Skates, MD  metroNIDAZOLE (METROGEL) 0.75 % vaginal gel Place 1 Applicatorful vaginally 2 (two) times daily. Patient not taking: Reported on 09/01/2016 04/19/16   [name], MD  ondansetron (ZOFRAN) 4 MG tablet Take 1 tablet (4 mg total) by mouth every 6 (six) hours. 09/01/16   Frederica Kuster, PA-C  sucralfate (CARAFATE) 1 GM/10ML suspension Take 10 mLs (1 g total) by mouth 4 (four) times daily -  with meals and at bedtime. 09/01/16   Frederica Kuster, PA-C    Family History Family History  Problem Relation Age of Onset  . Cancer Mother     colon, kidney cancer  . Cancer Father     throat cancer  . Cancer Sister   . Heart attack Brother     Social History Social History  Substance Use Topics  . Smoking status: Current Some Day Smoker    Packs/day: 0.25    Years: 55.00    Types: Cigarettes  . Smokeless tobacco: Never Used     Comment: cut back  amount still  trying to quit  . Alcohol use No     Allergies   Codeine; Ciprocin-fluocin-procin [fluocinolone acetonide]; Clonidine derivatives; Crestor [rosuvastatin calcium]; Penicillins; Simvastatin; and Tessalon perles   Review of Systems Review of Systems  Constitutional: Negative for chills and fever.  HENT: Negative for facial swelling and sore throat.   Respiratory: Negative for shortness of breath.   Cardiovascular: Negative for chest pain.  Gastrointestinal: Positive for abdominal pain and nausea. Negative for blood in stool, diarrhea and vomiting.  Genitourinary: Negative for dysuria.  Musculoskeletal: Negative for back pain.  Skin: Negative for rash and wound.  Neurological: Negative for headaches.  Psychiatric/Behavioral: The patient is not nervous/anxious.      Physical Exam  Updated Vital Signs BP 130/83   Pulse (!) 58   Temp 97.7 F (36.5 C) (Oral)   Resp 20   SpO2 95%   Physical Exam  Constitutional: She appears well-developed and well-nourished. No distress.  HENT:  Head: Normocephalic and atraumatic.  Mouth/Throat: Oropharynx is clear and moist. No oropharyngeal exudate.  Eyes: Conjunctivae are normal. Pupils are equal, round, and reactive to light. Right eye exhibits no discharge. Left eye exhibits no discharge. No scleral icterus.  Neck: Normal range of motion. Neck supple. No thyromegaly present.  Cardiovascular: Normal rate, regular rhythm, normal heart sounds and intact distal pulses.  Exam reveals no gallop and no friction rub.   No murmur heard. Pulmonary/Chest: Effort normal and breath sounds normal. No stridor. No respiratory distress. She has no wheezes. She has no rales.  Abdominal: Soft. Bowel sounds are normal. She exhibits no distension. There is generalized tenderness. There is no rebound, no guarding and no CVA tenderness.  Musculoskeletal: She exhibits no edema.       Lumbar back: She exhibits tenderness and bony tenderness.        Back:  Lymphadenopathy:    She has no cervical adenopathy.  Neurological: She is alert. Coordination normal.  Normal sensation to lower extremities; 5/5 strength to lower extremities; DP pulses intact; cap refill <2secs  Skin: Skin is warm and dry. No rash noted. She is not diaphoretic. No pallor.  No abnormalities seen to right proximal, posterior, medial thigh where the tick was apparently removed; mild tenderness to the area  Psychiatric: She has a normal mood and affect.  Nursing note and vitals reviewed.    ED Treatments / Results  Labs (all labs ordered are listed, but only abnormal results are displayed) Labs Reviewed  COMPREHENSIVE METABOLIC PANEL - Abnormal; Notable for the following:       Result Value   Potassium 5.2 (*)    Glucose, Bld 124 (*)    Creatinine, Ser 1.21 (*)    Total Bilirubin 1.7 (*)    GFR calc non Af Amer 42 (*)    GFR calc Af Amer 48 (*)    All other components within normal limits  LIPASE, BLOOD  CBC WITH DIFFERENTIAL/PLATELET  TROPONIN I  URINALYSIS, ROUTINE W REFLEX MICROSCOPIC (NOT AT [facility])    EKG  EKG Interpretation None       Radiology Ct Abdomen Pelvis W Contrast  Result Date: 09/01/2016 CLINICAL DATA:  History GI bleed in June 2017 with persistent nausea vomiting and abdominal pain. EXAM: CT ABDOMEN AND PELVIS WITH CONTRAST TECHNIQUE: Multidetector CT imaging of the abdomen and pelvis was performed using the standard protocol following bolus administration of intravenous contrast. CONTRAST:  161mL ISOVUE-300 IOPAMIDOL (ISOVUE-300) INJECTION 61% COMPARISON:  06/08/2016 FINDINGS: Lower chest: No acute abnormality. Hepatobiliary: Hepatic steatosis. Subcapsular lipoma of the right lobe of the liver is stable. Postcholecystectomy. No evidence of suspicious fluid collection or other inflammatory changes in the cholecystectomy bed. Pancreas: Unremarkable. No pancreatic ductal dilatation or surrounding inflammatory changes. Spleen: Normal in size  without focal abnormality. Adrenals/Urinary Tract: Post right nephrectomy. Left kidney, left ureter and left adrenal gland are normal. Urinary bladder is normal. Stomach/Bowel: Mild diffuse thickening of the gastric wall, particularly along the greater curvature. No evidence of obstruction. Mild scattered colonic diverticular without evidence of diverticulitis. Vascular/Lymphatic: Aortic atherosclerosis. No enlarged abdominal or pelvic lymph nodes. Reproductive: Status post hysterectomy. No adnexal masses. Other: Small fat containing left periumbilical abdominal wall hernia. No abdominopelvic ascites. Musculoskeletal: No acute  osseous findings. Osteoarthritic changes of the lower lumbosacral spine. IMPRESSION: No evidence of acute abnormalities within the solid abdominal organs. Mild nonspecific symmetric gastric mucosal thickening, favor infectious or inflammatory. Scattered colonic diverticulosis without evidence of diverticulitis. Persistent hepatic steatosis. No evidence of complicating features in the cholecystectomy surgical bed. Electronically Signed   By: Fidela Salisbury M.D.   On: 09/01/2016 14:20    Procedures Procedures (including critical care time)  Medications Ordered in ED Medications  iopamidol (ISOVUE-300) 61 % injection (100 mLs  Contrast Given 09/01/16 1334)     Initial Impression / Assessment and Plan / ED Course  I have reviewed the triage vital signs and the nursing notes.  Pertinent labs & imaging results that were available during my care of the patient were reviewed by me and considered in my medical decision making (see chart for details).  Clinical Course    CBC unremarkable. CMP shows potassium 5.2, glucose 124, creatinine 1.21, total bilirubin 1.7. Lipase 33. UA negative. Troponin <0.03. CT abdomen and pelvis shows [no evidence of acute abnormalities within the solid abdominal organs. Mild nonspecific symmetric gastric mucosal thickening, favor infectious or  inflammatory. Scattered colonic diverticulosis without evidence of diverticulitis. Persistent hepatic steatosis. No evidence of complicating features in the cholecystectomy surgical bed.] Normal neuro exam without focal deficits, patient ambulatory. Suspect back pain musculoskeletal, however refer to PCP for further evaluation. No warmth noted to posterior thigh and no lesion noted. Patient with full range of motion of leg. Follow-up to PCP for further evaluation of symptoms. Also follow-up to GI, Dr. Earlean Shawl, for further evaluation treatment. Patient discharged with Carafate and Zofran. Return precautions discussed. Patient understands and agrees with plan. Patient vitals stable throughout ED course and discharged in satisfactory condition.  Final Clinical Impressions(s) / ED Diagnoses   Final diagnoses:  Abdominal pain, unspecified abdominal location  Nausea    New Prescriptions New Prescriptions   ONDANSETRON (ZOFRAN) 4 MG TABLET    Take 1 tablet (4 mg total) by mouth every 6 (six) hours.   SUCRALFATE (CARAFATE) 1 GM/10ML SUSPENSION    Take 10 mLs (1 g total) by mouth 4 (four) times daily -  with meals and at bedtime.     Frederica Kuster, PA-C 09/01/16 [place], MD 09/04/16 [phone]

## 2016-09-01 NOTE — Discharge Instructions (Signed)
Medications: Carafate, Zofran  Treatment: Take Carafate 4 times daily as prescribed. Take Zofran every 6 hours as needed for nausea or vomiting. You can take Tylenol as prescribed over-the-counter for your pain.  Follow-up: Please follow-up with a primary care provider at Dr. Perfecto [place] office for further evaluation and treatment of your symptoms. Please also follow up with Dr. Earlean Shawl or other gastric urologist for further evaluation and treatment of your symptoms. Please return to emergency department if you develop any new or worsening symptoms.

## 2016-09-01 NOTE — ED Notes (Signed)
Pts daughter Wolfgang Phoenix wanted to leave contact number in case she needs to be reached: [phone]

## 2016-09-01 NOTE — ED Triage Notes (Signed)
Pt reports having gallbladder removed in may, still has right side abd pain and feels like abd is distended. Having nausea, no vomiting. Pt also reports recent tick removal and now has burning pain to right posterior thigh/buttock area and fatigue.

## 2016-09-28 ENCOUNTER — Other Ambulatory Visit: Payer: Self-pay | Admitting: Emergency Medicine

## 2016-09-28 DIAGNOSIS — R739 Hyperglycemia, unspecified: Secondary | ICD-10-CM

## 2016-09-29 NOTE — Telephone Encounter (Signed)
06/2016 last ov 08/2016 last 08/2016

## 2016-10-02 ENCOUNTER — Ambulatory Visit (INDEPENDENT_AMBULATORY_CARE_PROVIDER_SITE_OTHER): Payer: Medicare Other

## 2016-10-02 ENCOUNTER — Ambulatory Visit (INDEPENDENT_AMBULATORY_CARE_PROVIDER_SITE_OTHER): Payer: Medicare Other | Admitting: Family Medicine

## 2016-10-02 ENCOUNTER — Encounter: Payer: Self-pay | Admitting: Family Medicine

## 2016-10-02 VITALS — BP 128/88 | HR 70 | Temp 98.2°F | Resp 16 | Ht 66.0 in | Wt 176.0 lb

## 2016-10-02 DIAGNOSIS — E119 Type 2 diabetes mellitus without complications: Secondary | ICD-10-CM

## 2016-10-02 DIAGNOSIS — Z23 Encounter for immunization: Secondary | ICD-10-CM | POA: Diagnosis not present

## 2016-10-02 DIAGNOSIS — M25551 Pain in right hip: Secondary | ICD-10-CM | POA: Diagnosis not present

## 2016-10-02 DIAGNOSIS — N183 Chronic kidney disease, stage 3 unspecified: Secondary | ICD-10-CM

## 2016-10-02 DIAGNOSIS — E785 Hyperlipidemia, unspecified: Secondary | ICD-10-CM

## 2016-10-02 DIAGNOSIS — M5441 Lumbago with sciatica, right side: Secondary | ICD-10-CM

## 2016-10-02 DIAGNOSIS — M545 Low back pain: Secondary | ICD-10-CM | POA: Diagnosis not present

## 2016-10-02 DIAGNOSIS — M1611 Unilateral primary osteoarthritis, right hip: Secondary | ICD-10-CM | POA: Diagnosis not present

## 2016-10-02 DIAGNOSIS — I10 Essential (primary) hypertension: Secondary | ICD-10-CM

## 2016-10-02 DIAGNOSIS — G8929 Other chronic pain: Secondary | ICD-10-CM

## 2016-10-02 DIAGNOSIS — E039 Hypothyroidism, unspecified: Secondary | ICD-10-CM

## 2016-10-02 DIAGNOSIS — R739 Hyperglycemia, unspecified: Secondary | ICD-10-CM

## 2016-10-02 DIAGNOSIS — S79911A Unspecified injury of right hip, initial encounter: Secondary | ICD-10-CM | POA: Diagnosis not present

## 2016-10-02 LAB — POCT URINALYSIS DIP (MANUAL ENTRY)
Bilirubin, UA: NEGATIVE
Blood, UA: NEGATIVE
GLUCOSE UA: NEGATIVE
Ketones, POC UA: NEGATIVE
Leukocytes, UA: NEGATIVE
Nitrite, UA: NEGATIVE
Protein Ur, POC: NEGATIVE
SPEC GRAV UA: 1.01
UROBILINOGEN UA: 0.2
pH, UA: 6

## 2016-10-02 LAB — COMPLETE METABOLIC PANEL WITH GFR
ALT: 14 U/L (ref 6–29)
AST: 14 U/L (ref 10–35)
Albumin: 4.4 g/dL (ref 3.6–5.1)
Alkaline Phosphatase: 60 U/L (ref 33–130)
BILIRUBIN TOTAL: 0.5 mg/dL (ref 0.2–1.2)
BUN: 17 mg/dL (ref 7–25)
CO2: 32 mmol/L — AB (ref 20–31)
CREATININE: 1.45 mg/dL — AB (ref 0.60–0.93)
Calcium: 9.7 mg/dL (ref 8.6–10.4)
Chloride: 103 mmol/L (ref 98–110)
GFR, EST NON AFRICAN AMERICAN: 35 mL/min — AB (ref >=60)
GFR, Est African American: 40 mL/min — ABNORMAL LOW (ref >=60)
GLUCOSE: 116 mg/dL — AB (ref 65–99)
Potassium: 4.8 mmol/L (ref 3.5–5.3)
SODIUM: 140 mmol/L (ref 135–146)
TOTAL PROTEIN: 7.3 g/dL (ref 6.1–8.1)

## 2016-10-02 LAB — TSH: TSH: 2.51 mIU/L

## 2016-10-02 LAB — POCT GLYCOSYLATED HEMOGLOBIN (HGB A1C): HEMOGLOBIN A1C: 7.3

## 2016-10-02 NOTE — Patient Instructions (Addendum)
Look into the [employer] bus to get you transportation to the doctors offices. Use tylenol arthritis for joint pain and the right hip arthritis.     IF you received an x-ray today, you will receive an invoice from [facility]. Please contact [facility] at [phone] with questions or concerns regarding your invoice.   IF you received labwork today, you will receive an invoice from [employer]. Please contact [facility] at [phone] with questions or concerns regarding your invoice.   Our billing staff will not be able to assist you with questions regarding bills from these companies.  You will be contacted with the lab results as soon as they are available. The fastest way to get your results is to activate your My Chart account. Instructions are located on the last page of this paperwork. If you have not heard from [place] regarding the results in 2 weeks, please contact this office.     Osteoarthritis Osteoarthritis is a disease that causes soreness and inflammation of a joint. It occurs when the cartilage at the affected joint wears down. Cartilage acts as a cushion, covering the ends of bones where they meet to form a joint. Osteoarthritis is the most common form of arthritis. It often occurs in older people. The joints affected most often by this condition include those in the:  Ends of the fingers.  Thumbs.  Neck.  Lower back.  Knees.  Hips. CAUSES  Over time, the cartilage that covers the ends of bones begins to wear away. This causes bone to rub on bone, producing pain and stiffness in the affected joints.  RISK FACTORS Certain factors can increase your chances of having osteoarthritis, including:  Older age.  Excessive body weight.  Overuse of joints.  Previous joint injury. SIGNS AND SYMPTOMS   Pain, swelling, and stiffness in the joint.  Over time, the joint may lose its normal shape.  Small deposits of bone (osteophytes)  may grow on the edges of the joint.  Bits of bone or cartilage can break off and float inside the joint space. This may cause more pain and damage. DIAGNOSIS  Your health care provider will do a physical exam and ask about your symptoms. Various tests may be ordered, such as:  X-rays of the affected joint.  Blood tests to rule out other types of arthritis. Additional tests may be used to diagnose your condition. TREATMENT  Goals of treatment are to control pain and improve joint function. Treatment plans may include:  A prescribed exercise program that allows for rest and joint relief.  A weight control plan.  Pain relief techniques, such as:  Properly applied heat and cold.  Electric pulses delivered to nerve endings under the skin (transcutaneous electrical nerve stimulation [TENS]).  Massage.  Certain nutritional supplements.  Medicines to control pain, such as:  Acetaminophen.  Nonsteroidal anti-inflammatory drugs (NSAIDs), such as naproxen.  Narcotic or central-acting agents, such as tramadol.  Corticosteroids. These can be given orally or as an injection.  Surgery to reposition the bones and relieve pain (osteotomy) or to remove loose pieces of bone and cartilage. Joint replacement may be needed in advanced states of osteoarthritis. HOME CARE INSTRUCTIONS   Take medicines only as directed by your health care provider.  Maintain a healthy weight. Follow your health care provider's instructions for weight control. This may include dietary instructions.  Exercise as directed. Your health care provider can recommend specific types of exercise. These may include:  Strengthening exercises. These are  done to strengthen the muscles that support joints affected by arthritis. They can be performed with weights or with exercise bands to add resistance.  Aerobic activities. These are exercises, such as brisk walking or low-impact aerobics, that get your heart  pumping.  Range-of-motion activities. These keep your joints limber.  Balance and agility exercises. These help you maintain daily living skills.  Rest your affected joints as directed by your health care provider.  Keep all follow-up visits as directed by your health care provider. SEEK MEDICAL CARE IF:   Your skin turns red.  You develop a rash in addition to your joint pain.  You have worsening joint pain.  You have a fever along with joint or muscle aches. SEEK IMMEDIATE MEDICAL CARE IF:  You have a significant loss of weight or appetite.  You have night sweats. [place] of Arthritis and Musculoskeletal and Skin Diseases: www.niams.SouthExposed.es  [employer] on Aging: http://kim-miller.com/  American College of Rheumatology: www.rheumatology.org   This information is not intended to replace advice given to you by your health care provider. Make sure you discuss any questions you have with your health care provider.   Document Released: 11/12/2005 Document Revised: 12/03/2014 Document Reviewed: 07/20/2013 Elsevier Interactive Patient Education 2016 [employer]  Generic Hip Exercises RANGE OF MOTION (ROM) AND STRETCHING EXERCISES  These exercises may help you when beginning to rehabilitate your injury. Doing them too aggressively can worsen your condition. Complete them slowly and gently. Your symptoms may resolve with or without further involvement from your physician, physical therapist or athletic trainer. While completing these exercises, remember:   Restoring tissue flexibility helps normal motion to return to the joints. This allows healthier, less painful movement and activity.  An effective stretch should be held for at least 30 seconds.  A stretch should never be painful. You should only feel a gentle lengthening or release in the stretched tissue. If these stretches worsen your symptoms even when done gently, consult your  physician, physical therapist or athletic trainer. STRETCH - Hamstrings, Supine   Lie on your back. Loop a belt or towel over the ball of your right / left foot.  Straighten your right / left knee and slowly pull on the belt to raise your leg. Do not allow the right / left knee to bend. Keep your opposite leg flat on the floor.  Raise the leg until you feel a gentle stretch behind your right / left knee or thigh. Hold this position for __________ seconds. Repeat __________ times. Complete this stretch __________ times per day.  STRETCH - Hip Rotators   Lie on your back on a firm surface. Grasp your right / left knee with your right / left hand and your ankle with your opposite hand.  Keeping your hips and shoulders firmly planted, gently pull your right / left knee and rotate your lower leg toward your opposite shoulder until you feel a stretch in your buttocks.  Hold this stretch for __________ seconds. Repeat this stretch __________ times. Complete this stretch __________ times per day. STRETCH - Hamstrings/Adductors, V-Sit   Sit on the floor with your legs extended in a large "V," keeping your knees straight.  With your head and chest upright, bend at your waist reaching for your right foot to stretch your left adductors.  You should feel a stretch in your left inner thigh. Hold for __________ seconds.  Return to the upright position to relax your leg muscles.  Continuing  to keep your chest upright, bend straight forward at your waist to stretch your hamstrings.  You should feel a stretch behind both of your thighs and/or knees. Hold for __________ seconds.  Return to the upright position to relax your leg muscles.  Repeat steps 2 through 4 for opposite leg. Repeat __________ times. Complete this exercise __________ times per day.  STRETCHING - Hip Flexors, Lunge  Half kneel with your right / left knee on the floor and your opposite knee bent and directly over your  ankle.  Keep good posture with your head over your shoulders. Tighten your buttocks to point your tailbone downward; this will prevent your back from arching too much.  You should feel a gentle stretch in the front of your thigh and/or hip. If you do not feel any resistance, slightly slide your opposite foot forward and then slowly lunge forward so your knee once again lines up over your ankle. Be sure your tailbone remains pointed downward.  Hold this stretch for __________ seconds. Repeat __________ times. Complete this stretch __________ times per day. STRENGTHENING EXERCISES These exercises may help you when beginning to rehabilitate your injury. They may resolve your symptoms with or without further involvement from your physician, physical therapist or athletic trainer. While completing these exercises, remember:   Muscles can gain both the endurance and the strength needed for everyday activities through controlled exercises.  Complete these exercises as instructed by your physician, physical therapist or athletic trainer. Progress the resistance and repetitions only as guided.  You may experience muscle soreness or fatigue, but the pain or discomfort you are trying to eliminate should never worsen during these exercises. If this pain does worsen, stop and make certain you are following the directions exactly. If the pain is still present after adjustments, discontinue the exercise until you can discuss the trouble with your clinician. STRENGTH - Hip Extensors, Bridge   Lie on your back on a firm surface. Bend your knees and place your feet flat on the floor.  Tighten your buttocks muscles and lift your bottom off the floor until your trunk is level with your thighs. You should feel the muscles in your buttocks and back of your thighs working. If you do not feel these muscles, slide your feet 1-2 inches further away from your buttocks.  Hold this position for __________  seconds.  Slowly lower your hips to the starting position and allow your buttock muscles relax completely before beginning the next repetition.  If this exercise is too easy, you may cross your arms over your chest. Repeat __________ times. Complete this exercise __________ times per day.  STRENGTH - Hip Abductors, Straight Leg Raises  Be aware of your form throughout the entire exercise so that you exercise the correct muscles. Sloppy form means that you are not strengthening the correct muscles.  Lie on your side so that your head, shoulders, knee and hip line up. You may bend your lower knee to help maintain your balance. Your right / left leg should be on top.  Roll your hips slightly forward, so that your hips are stacked directly over each other and your right / left knee is facing forward.  Lift your top leg up 4-6 inches, leading with your heel. Be sure that your foot does not drift forward or that your knee does not roll toward the ceiling.  Hold this position for __________ seconds. You should feel the muscles in your outer hip lifting (you may not notice this  until your leg begins to tire).  Slowly lower your leg to the starting position. Allow the muscles to fully relax before beginning the next repetition. Repeat __________ times. Complete this exercise __________ times per day.  STRENGTH - Hip Adductors, Straight Leg Raises   Lie on your side so that your head, shoulders, knee and hip line up. You may place your upper foot in front to help maintain your balance. Your right / left leg should be on the bottom.  Roll your hips slightly forward, so that your hips are stacked directly over each other and your right / left knee is facing forward.  Tense the muscles in your inner thigh and lift your bottom leg 4-6 inches. Hold this position for __________ seconds.  Slowly lower your leg to the starting position. Allow the muscles to fully relax before beginning the next  repetition. Repeat __________ times. Complete this exercise __________ times per day.  STRENGTH - Quadriceps, Straight Leg Raises  Quality counts! Watch for signs that the quadriceps muscle is working to insure you are strengthening the correct muscles and not "cheating" by substituting with healthier muscles.  Lay on your back with your right / left leg extended and your opposite knee bent.  Tense the muscles in the front of your right / left thigh. You should see either your knee cap slide up or increased dimpling just above the knee. Your thigh may even quiver.  Tighten these muscles even more and raise your leg 4 to 6 inches off the floor. Hold for right / left seconds.  Keeping these muscles tense, lower your leg.  Relax the muscles slowly and completely in between each repetition. Repeat __________ times. Complete this exercise __________ times per day.  STRENGTH - Hip Abductors, Standing  Tie one end of a rubber exercise band/tubing to a secure surface (table, pole) and tie a loop at the other end.  Place the loop around your right / left ankle. Keeping your ankle with the band directly opposite of the secured end, step away until there is tension in the tube/band.  Hold onto a chair as needed for balance.  Keeping your back upright, your shoulders over your hips, and your toes pointing forward, lift your right / left leg out to your side. Be sure to lift your leg with your hip muscles. Do not "throw" your leg or tip your body to lift your leg.  Slowly and with control, return to the starting position. Repeat exercise __________ times. Complete this exercise __________ times per day.  STRENGTH - Quadriceps, Squats  Stand in a door frame so that your feet and knees are in line with the frame.  Use your hands for balance, not support, on the frame.  Slowly lower your weight, bending at the hips and knees. Keep your lower legs upright so that they are parallel with the door  frame. Squat only within the range that does not increase your knee pain. Never let your hips drop below your knees.  Slowly return upright, pushing with your legs, not pulling with your hands.   This information is not intended to replace advice given to you by your health care provider. Make sure you discuss any questions you have with your health care provider.   Document Released: 11/30/2005 Document Revised: 12/03/2014 Document Reviewed: 02/24/2009 Elsevier Interactive Patient Education [employer].

## 2016-10-02 NOTE — Progress Notes (Addendum)
Subjective:  By signing my name below, I, Sherri Burns, attest that this documentation has been prepared under the direction and in the presence of Delman Cheadle, MD Electronically Signed: Ladene Artist, ED Scribe 10/02/2016 at 11:10 AM.   Patient ID: Sherri Burns, female    DOB: 11/16/1936, 79 y.o.   MRN: [id number] Chief Complaint  Patient presents with  . Medication Refill    Metoprolol , Glipizide  . other    depression scale # 7  . Immunizations    flu   HPI  HPI Comments: Sherri Burns is a 80 y.o. female, with a h/o renal CA, who presents to the [facility] for a medication refill of metoprolol and glipizide. Pt's A1C 6 months prior was 6.4. She is on Klonopin 1 mg, uses 2 tabs daily, will need new script til January. Most of her medications will need refills within the next 3 months. Last metabolic panel 1 month prior showed GFR of 42 and slightly elevated potassium at 5.2. Baseline creatine at 1.2. Lipids last checked over 1 year prior with LDL of 142 so she was started on Crestor which she appears to no longer be taking. TSH last checked 9 months prior was at the low end of normal. Urine microalbumin over 1 year prior was positive.   Pt was seen in the ED 1 month prior for abdominal pain and bloating which she has suffered with since cholecystectomy 6 months prior. She has been treated for a coarse of diverticulitis since then but was complaining of occasional chest pain and night sweats. CT of abdomen and pelvis showed mild gastric mucosal thickening consistent with infection or inflammation, hepatic steatosis. Also complaining of back pain over bilateral lumbar back which was suspected to be musculoskeletal and advised to follow up with her PCP. Pt was put on Carafate and Zofran and encouraged to follow up with Dr. Earlean Shawl. She states that she has not followed up with Dr. Earlean Shawl due to finances and since she is not currently driving. Pt last had a right hip XR  in 03-21-2010 that was normal. Today, she reports abdominal pain in the RLQ that radiates into her right low back and into her right leg. Pain is exacerbated with bending forward. No medications tried PTA for pain.   Pt has not seen her eye doctor in the past 8 years. She states that she has been on Klonopin since her son committed suicide in 2002-03-21; she currently takes half tablet in the morning, half tablet at noon and 1 tablet at night.   Her husband passed away at 52 yo in 21-Mar-2014 fro pancreatic cancer.  Her son passed away around 03/21/02.  Her daughter is very pbusy with his family.  Son is very self-centered.  All her life has been hard.   Past Medical History:  Diagnosis Date  . Abnormal EKG 02/07/2016   Inferolateral T wave inversion and ST depression.  Marland Kitchen Anxiety   . Arthritis   . Cancer ([place]) 15 yrs ago   ho renal s/p nephrectomy  . Colon polyps 11/2008   tubular adenoma  . Diabetes mellitus   . Elevated serum creatinine   . Elevated TSH   . Esophageal problem    esophageal dilation  . GERD (gastroesophageal reflux disease)    + hpylori  . Headache   . Hyperlipidemia   . Hypertension   . Hypothyroidism   . Ischemic colitis ([place])   . Renal insufficiency  Current Outpatient Prescriptions on File Prior to Visit  Medication Sig Dispense Refill  . aspirin EC 81 MG tablet Take 81 mg by mouth daily.    . cholecalciferol (VITAMIN D) 1000 units tablet Take 1,000 Units by mouth daily.    . clonazePAM (KLONOPIN) 1 MG tablet TAKE 1/2 IN THE MORNING AND 1/2 IN THE AFTERNOON AND 1 AT BEDTIME 60 tablet 5  . Colloidal Oatmeal (GOLD BOND ULTRA ECZEMA RELIEF EX) Apply 1 application topically daily.    Marland Kitchen diltiazem (CARTIA XT) 240 MG 24 hr capsule Take 1 capsule (240 mg total) by mouth daily. 90 capsule 3  . fluticasone (FLONASE) 50 MCG/ACT nasal spray Place 2 sprays into both nostrils daily. (Patient taking differently: Place 2 sprays into both nostrils daily as needed for allergies. ) 16 g 10  .  furosemide (LASIX) 40 MG tablet Take 1 tablet (40 mg total) by mouth daily. 90 tablet 3  . glipiZIDE (GLUCOTROL XL) 10 MG 24 hr tablet TAKE 1 TABLET BY MOUTH TWICE DAILY 60 tablet 0  . levothyroxine (SYNTHROID, LEVOTHROID) 75 MCG tablet Take 1 tablet (75 mcg total) by mouth daily before breakfast. 90 tablet 3  . losartan (COZAAR) 25 MG tablet Take 25 mg by mouth daily.    . metoprolol (LOPRESSOR) 100 MG tablet TAKE 1 TABLET BY MOUTH TWICE A DAY 60 tablet 11  . omeprazole (PRILOSEC) 40 MG capsule Take 1 capsule (40 mg total) by mouth daily. (Patient taking differently: Take 40 mg by mouth daily before breakfast. ) 30 capsule 11  . ondansetron (ZOFRAN) 4 MG tablet Take 1 tablet (4 mg total) by mouth every 6 (six) hours. 12 tablet 0  . polyethylene glycol (MIRALAX / GLYCOLAX) packet Take 8.5 g by mouth every morning.     . sucralfate (CARAFATE) 1 GM/10ML suspension Take 10 mLs (1 g total) by mouth 4 (four) times daily -  with meals and at bedtime. 420 mL 0  . vitamin E 100 UNIT capsule Take 100 Units by mouth daily.     No current facility-administered medications on file prior to visit.    Allergies  Allergen Reactions  . Codeine Other (See Comments)    hallucinations  . Ciprocin-Fluocin-Procin [Fluocinolone Acetonide] Other (See Comments)    Unknown reaction  . Clonidine Derivatives Other (See Comments)    Dry mouth  . Crestor [Rosuvastatin Calcium] Other (See Comments)    cramps  . Penicillins Other (See Comments)    Has patient had a PCN reaction causing immediate rash, facial/tongue/throat swelling, SOB or lightheadedness with hypotension: no Has patient had a PCN reaction causing severe rash involving mucus membranes or skin necrosis: no Has patient had a PCN reaction that required hospitalization : no Has patient had a PCN reaction occurring within the last 10 years: no -Hallucinates If all of the above answers are "NO", then may proceed with Cephalosporin use.   . Simvastatin  Other (See Comments)    Upset stomach   . Tessalon Perles Other (See Comments)    Gi upset    Review of Systems  Gastrointestinal: Positive for abdominal pain.  Musculoskeletal: Positive for arthralgias.   BP 128/88 (BP Location: Right Arm, Patient Position: Sitting, Cuff Size: Normal)   Pulse 70   Temp 98.2 F (36.8 C) (Oral)   Resp 16   Ht 5\' 6"  (1.676 m)   Wt 176 lb (79.8 kg)   SpO2 96%   BMI 28.41 kg/m     Objective:   Physical  Exam  Constitutional: She is oriented to person, place, and time. She appears well-developed and well-nourished. No distress.  HENT:  Head: Normocephalic and atraumatic.  Eyes: Conjunctivae and EOM are normal.  Neck: Neck supple. No tracheal deviation present.  Cardiovascular: Normal rate, regular rhythm, S1 normal, S2 normal and normal heart sounds.   Pulmonary/Chest: Effort normal and breath sounds normal. No respiratory distress.  Lungs are clear to auscultation.   Abdominal: Soft. Bowel sounds are normal. She exhibits no distension. There is generalized tenderness. There is no CVA tenderness.  Musculoskeletal: Normal range of motion.  No tenderness over lumbar spine or SI joints. No palpable lumbar or paraspinal spasms. Pain with flexion of bilateral hips. Good ROM of bilateral hips, reduced R compared to L.   Neurological: She is alert and oriented to person, place, and time.  Skin: Skin is warm and dry.  Psychiatric: She has a normal mood and affect. Her behavior is normal.  Nursing note and vitals reviewed.     Results for orders placed or performed in visit on 10/02/16  COMPLETE METABOLIC PANEL WITH GFR  Result Value Ref Range   Sodium 140 135 - 146 mmol/L   Potassium 4.8 3.5 - 5.3 mmol/L   Chloride 103 98 - 110 mmol/L   CO2 32 (H) 20 - 31 mmol/L   Glucose, Bld 116 (H) 65 - 99 mg/dL   BUN 17 7 - 25 mg/dL   Creat 1.45 (H) 0.60 - 0.93 mg/dL   Total Bilirubin 0.5 0.2 - 1.2 mg/dL   Alkaline Phosphatase 60 33 - 130 U/L   AST 14 10 -  35 U/L   ALT 14 6 - 29 U/L   Total Protein 7.3 6.1 - 8.1 g/dL   Albumin 4.4 3.6 - 5.1 g/dL   Calcium 9.7 8.6 - 10.4 mg/dL   GFR, Est African American 40 (L) >=60 mL/min   GFR, Est Non African American 35 (L) >=60 mL/min  TSH  Result Value Ref Range   TSH 2.51 mIU/L  Microalbumin/Creatinine Ratio, Urine  Result Value Ref Range   Creatinine, Urine 119 20 - 320 mg/dL   Microalb, Ur 0.7 Not estab mg/dL   Microalb Creat Ratio 6 <30 mcg/mg creat  POCT glycosylated hemoglobin (Hb A1C)  Result Value Ref Range   Hemoglobin A1C 7.3   POCT urinalysis dipstick  Result Value Ref Range   Color, UA yellow yellow   Clarity, UA clear clear   Glucose, UA negative negative   Bilirubin, UA negative negative   Ketones, POC UA negative negative   Spec Grav, UA 1.010    Blood, UA negative negative   pH, UA 6.0    Protein Ur, POC negative negative   Urobilinogen, UA 0.2    Nitrite, UA Negative Negative   Leukocytes, UA Negative Negative   Dg Lumbar Spine 2-3 Views  Result Date: 10/02/2016 CLINICAL DATA:  Right lower quadrant abdominal pain radiating into the low back and right leg. Pain is worsened with bending forward. History of previous right nephrectomy and cholecystectomy EXAM: LUMBAR SPINE - 2-3 VIEW COMPARISON:  Abdominal series of May 27, 2016 FINDINGS: The lumbar vertebral bodies are preserved in height. There is grade 1 anterolisthesis of L4 with respect L5. The disc space heights are well maintained. There is mild facet joint hypertrophy at L4-5 and at L5-S1. The pedicles and transverse processes are intact. The observed portions of the sacrum are normal. There is calcification in the wall of the abdominal aorta and  iliac vessels. There are numerous surgical clips in the medial aspect of the right mid and upper abdomen. There are phleboliths within the pelvis. IMPRESSION: Grade 1 anterolisthesis of L4 with respect L5. Mild facet joint hypertrophy at this level and L5-S1. No compression fracture  or significant disc space narrowing. Aortoiliac atherosclerosis. Electronically Signed   By: David  Martinique M.D.   On: 10/02/2016 12:14   Dg Hip Unilat W Or W/o Pelvis 2-3 Views Right  Result Date: 10/02/2016 CLINICAL DATA:  Right hip pain without known injury. Chronic bilateral low back pain with sciatic symptoms. EXAM: DG HIP (WITH OR WITHOUT PELVIS) 2-3V RIGHT COMPARISON:  Coronal and sagittal CT images through the pelvis from a CT scan of September 01, 2016 FINDINGS: The bones of the pelvis are subjectively adequately mineralized. There is no lytic nor blastic lesion. AP and lateral views of the right hip reveal moderate symmetric narrowing of the joint space. The articular surfaces of the femoral head and acetabulum remains smoothly rounded. The femoral neck, intertrochanteric, and subtrochanteric regions are normal. IMPRESSION: There is no acute bony abnormality of the right hip or pelvis. There is moderate symmetric narrowing of the right hip joint space consistent with osteoarthritis. Electronically Signed   By: David  Martinique M.D.   On: 10/02/2016 12:16    Assessment & Plan:   1. Diabetes mellitus without complication ([place])   2. Needs flu shot   3. Hyperlipidemia, unspecified hyperlipidemia type   4. Essential hypertension   5. Hypothyroidism, unspecified type   6. CKD (chronic kidney disease), stage III   7. Chronic right hip pain   8. Chronic bilateral low back pain with right-sided sciatica   9. Primary osteoarthritis of right hip    Ok to refill meds as needed to get pt to next f/u OV in 3 mos. - mid Feb 2018  Orders Placed This Encounter  Procedures  . DG Lumbar Spine 2-3 Views    Standing Status:   Future    Number of Occurrences:   1    Standing Expiration Date:   10/02/2017    Order Specific Question:   Reason for Exam (SYMPTOM  OR DIAGNOSIS REQUIRED)    Answer:   back pain wiht right radiculopathy, right groin pain    Order Specific Question:   Preferred imaging location?     Answer:   External  . DG HIP UNILAT W OR W/O PELVIS 2-3 VIEWS RIGHT    Standing Status:   Future    Number of Occurrences:   1    Standing Expiration Date:   10/02/2017    Order Specific Question:   Reason for Exam (SYMPTOM  OR DIAGNOSIS REQUIRED)    Answer:   back pain wiht right radiculopathy, right groin pain    Order Specific Question:   Preferred imaging location?    Answer:   External  . Flu Vaccine QUAD 36+ mos PF IM (Fluarix & Fluzone Quad PF)  . COMPLETE METABOLIC PANEL WITH GFR  . TSH  . Microalbumin/Creatinine Ratio, Urine  . POCT glycosylated hemoglobin (Hb A1C)  . POCT urinalysis dipstick   Over 40 min spent in face-to-face evaluation of and consultation with patient and coordination of care.  Over 50% of this time was spent counseling this patient.  I personally performed the services described in this documentation, which was scribed in my presence. The recorded information has been reviewed and considered, and addended by me as needed.   Delman Cheadle, M.D.  [facility] [address] [place], [place] [address] [phone] phone [phone] fax  10/12/16 3:03 PM

## 2016-10-03 LAB — MICROALBUMIN / CREATININE URINE RATIO
CREATININE, URINE: 119 mg/dL (ref 20–320)
MICROALB UR: 0.7 mg/dL
Microalb Creat Ratio: 6 mcg/mg creat (ref <30)

## 2016-10-09 DIAGNOSIS — R1011 Right upper quadrant pain: Secondary | ICD-10-CM | POA: Diagnosis not present

## 2016-10-09 DIAGNOSIS — K641 Second degree hemorrhoids: Secondary | ICD-10-CM | POA: Diagnosis not present

## 2016-10-12 ENCOUNTER — Other Ambulatory Visit: Payer: Self-pay

## 2016-10-12 ENCOUNTER — Other Ambulatory Visit: Payer: Self-pay | Admitting: Emergency Medicine

## 2016-10-12 MED ORDER — GLIPIZIDE ER 10 MG PO TB24: 10.0000 mg | ORAL_TABLET | Freq: Two times a day (BID) | ORAL | 1 refills | Status: DC

## 2016-10-12 MED ORDER — LOSARTAN POTASSIUM 25 MG PO TABS: 25.0000 mg | ORAL_TABLET | Freq: Every day | ORAL | 1 refills | Status: DC

## 2016-10-12 NOTE — Telephone Encounter (Signed)
Pharmacy called to request a refill for klonopin.  Patient said the medication is to be approved by Dr. Brigitte Pulse now instead of [place].  Please advise  [phone]

## 2016-10-12 NOTE — Telephone Encounter (Addendum)
Dr. Everlene Farrier rx'ed pt a 6 mo rx on 7/21 so she should not need refill until mid January at which point I would like to see her in the office for this.  Review of the database shows that this rx was written by [facility] and pt has gotten 3 of the 6 refills at [facility] but I don't see any reason why she can't keep getting the additional 3 refills prior to 12/16/16.

## 2016-10-17 NOTE — Telephone Encounter (Signed)
Checked w/ pharmacy and they verified that they did already have the RFs. Filled one last week and have one RF left for mid Dec. Advised him that pt will need to return then in Jan.

## 2016-10-23 DIAGNOSIS — D125 Benign neoplasm of sigmoid colon: Secondary | ICD-10-CM | POA: Diagnosis not present

## 2016-10-23 DIAGNOSIS — Z8719 Personal history of other diseases of the digestive system: Secondary | ICD-10-CM | POA: Diagnosis not present

## 2016-10-23 DIAGNOSIS — K648 Other hemorrhoids: Secondary | ICD-10-CM | POA: Diagnosis not present

## 2016-10-23 DIAGNOSIS — K625 Hemorrhage of anus and rectum: Secondary | ICD-10-CM | POA: Diagnosis not present

## 2016-10-23 DIAGNOSIS — Z86010 Personal history of colonic polyps: Secondary | ICD-10-CM | POA: Diagnosis not present

## 2016-10-23 DIAGNOSIS — R1011 Right upper quadrant pain: Secondary | ICD-10-CM | POA: Diagnosis not present

## 2016-10-23 DIAGNOSIS — D126 Benign neoplasm of colon, unspecified: Secondary | ICD-10-CM | POA: Diagnosis not present

## 2016-10-23 DIAGNOSIS — Z800 Family history of malignant neoplasm of digestive organs: Secondary | ICD-10-CM | POA: Diagnosis not present

## 2016-10-23 DIAGNOSIS — D124 Benign neoplasm of descending colon: Secondary | ICD-10-CM | POA: Diagnosis not present

## 2016-10-23 DIAGNOSIS — K5730 Diverticulosis of large intestine without perforation or abscess without bleeding: Secondary | ICD-10-CM | POA: Diagnosis not present

## 2016-10-23 DIAGNOSIS — K635 Polyp of colon: Secondary | ICD-10-CM | POA: Diagnosis not present

## 2016-10-23 LAB — HM COLONOSCOPY

## 2016-10-27 DIAGNOSIS — E119 Type 2 diabetes mellitus without complications: Secondary | ICD-10-CM | POA: Diagnosis not present

## 2016-12-04 ENCOUNTER — Other Ambulatory Visit: Payer: Self-pay | Admitting: Emergency Medicine

## 2016-12-04 DIAGNOSIS — I10 Essential (primary) hypertension: Secondary | ICD-10-CM

## 2016-12-07 ENCOUNTER — Other Ambulatory Visit: Payer: Self-pay | Admitting: Emergency Medicine

## 2016-12-07 DIAGNOSIS — I10 Essential (primary) hypertension: Secondary | ICD-10-CM

## 2016-12-09 NOTE — Telephone Encounter (Signed)
09/2016 last ov 

## 2016-12-10 ENCOUNTER — Other Ambulatory Visit: Payer: Self-pay | Admitting: Physician Assistant

## 2016-12-13 NOTE — Telephone Encounter (Signed)
05/2016 last refill with 5 additional Last ov 09/2016

## 2017-01-02 ENCOUNTER — Emergency Department (HOSPITAL_COMMUNITY): Payer: Medicare Other

## 2017-01-02 ENCOUNTER — Encounter (HOSPITAL_COMMUNITY): Payer: Self-pay | Admitting: *Deleted

## 2017-01-02 DIAGNOSIS — Z7982 Long term (current) use of aspirin: Secondary | ICD-10-CM | POA: Insufficient documentation

## 2017-01-02 DIAGNOSIS — Z7984 Long term (current) use of oral hypoglycemic drugs: Secondary | ICD-10-CM | POA: Diagnosis not present

## 2017-01-02 DIAGNOSIS — N183 Chronic kidney disease, stage 3 (moderate): Secondary | ICD-10-CM | POA: Insufficient documentation

## 2017-01-02 DIAGNOSIS — M79602 Pain in left arm: Secondary | ICD-10-CM | POA: Diagnosis not present

## 2017-01-02 DIAGNOSIS — R079 Chest pain, unspecified: Secondary | ICD-10-CM | POA: Diagnosis not present

## 2017-01-02 DIAGNOSIS — E039 Hypothyroidism, unspecified: Secondary | ICD-10-CM | POA: Diagnosis not present

## 2017-01-02 DIAGNOSIS — Z85528 Personal history of other malignant neoplasm of kidney: Secondary | ICD-10-CM | POA: Insufficient documentation

## 2017-01-02 DIAGNOSIS — E1122 Type 2 diabetes mellitus with diabetic chronic kidney disease: Secondary | ICD-10-CM | POA: Diagnosis not present

## 2017-01-02 DIAGNOSIS — I129 Hypertensive chronic kidney disease with stage 1 through stage 4 chronic kidney disease, or unspecified chronic kidney disease: Secondary | ICD-10-CM | POA: Insufficient documentation

## 2017-01-02 DIAGNOSIS — M25522 Pain in left elbow: Secondary | ICD-10-CM | POA: Diagnosis not present

## 2017-01-02 DIAGNOSIS — F17210 Nicotine dependence, cigarettes, uncomplicated: Secondary | ICD-10-CM | POA: Insufficient documentation

## 2017-01-02 DIAGNOSIS — E1140 Type 2 diabetes mellitus with diabetic neuropathy, unspecified: Secondary | ICD-10-CM | POA: Insufficient documentation

## 2017-01-02 LAB — CBC
HCT: 40.6 % (ref 36.0–46.0)
Hemoglobin: 13.5 g/dL (ref 12.0–15.0)
MCH: 32.5 pg (ref 26.0–34.0)
MCHC: 33.3 g/dL (ref 30.0–36.0)
MCV: 97.6 fL (ref 78.0–100.0)
PLATELETS: 215 10*3/uL (ref 150–400)
RBC: 4.16 MIL/uL (ref 3.87–5.11)
RDW: 14.2 % (ref 11.5–15.5)
WBC: 7.6 10*3/uL (ref 4.0–10.5)

## 2017-01-02 LAB — I-STAT TROPONIN, ED: Troponin i, poc: 0 ng/mL (ref 0.00–0.08)

## 2017-01-02 LAB — BASIC METABOLIC PANEL
Anion gap: 9 (ref 5–15)
BUN: 19 mg/dL (ref 6–20)
CHLORIDE: 100 mmol/L — AB (ref 101–111)
CO2: 29 mmol/L (ref 22–32)
CREATININE: 1.35 mg/dL — AB (ref 0.44–1.00)
Calcium: 10.1 mg/dL (ref 8.9–10.3)
GFR calc Af Amer: 42 mL/min — ABNORMAL LOW (ref >60)
GFR calc non Af Amer: 37 mL/min — ABNORMAL LOW (ref >60)
Glucose, Bld: 148 mg/dL — ABNORMAL HIGH (ref 65–99)
Potassium: 5.7 mmol/L — ABNORMAL HIGH (ref 3.5–5.1)
Sodium: 138 mmol/L (ref 135–145)

## 2017-01-02 NOTE — ED Triage Notes (Signed)
Pt says that she had an IV in her left hand in May when she was in the hospital getting her gallblader out and when she came home she had a knot in her hand. She says she has had pain in her left arm and the knot has traveled up her left arm to her elbow area with some swelling. Has not seen here doctor about the same, was going today because the apin was so bad but did not. AFTER triage when pt is walking out the room she states that she was also having a sharp pain in her left ribs last night.

## 2017-01-03 ENCOUNTER — Emergency Department (HOSPITAL_BASED_OUTPATIENT_CLINIC_OR_DEPARTMENT_OTHER)
Admit: 2017-01-03 | Discharge: 2017-01-03 | Disposition: A | Discharge status: Home / Self Care | Payer: Medicare Other | Attending: Emergency Medicine | Admitting: Emergency Medicine

## 2017-01-03 ENCOUNTER — Emergency Department (HOSPITAL_COMMUNITY)
Admission: EM | Admit: 2017-01-03 | Discharge: 2017-01-03 | Disposition: A | Discharge status: Home / Self Care | Payer: Medicare Other | Attending: Emergency Medicine | Admitting: Emergency Medicine

## 2017-01-03 ENCOUNTER — Emergency Department (HOSPITAL_COMMUNITY): Payer: Medicare Other

## 2017-01-03 DIAGNOSIS — R079 Chest pain, unspecified: Secondary | ICD-10-CM | POA: Diagnosis not present

## 2017-01-03 DIAGNOSIS — M25522 Pain in left elbow: Secondary | ICD-10-CM

## 2017-01-03 DIAGNOSIS — M79602 Pain in left arm: Secondary | ICD-10-CM

## 2017-01-03 DIAGNOSIS — M79609 Pain in unspecified limb: Secondary | ICD-10-CM | POA: Diagnosis not present

## 2017-01-03 LAB — TROPONIN I

## 2017-01-03 LAB — I-STAT TROPONIN, ED: Troponin i, poc: 0 ng/mL (ref 0.00–0.08)

## 2017-01-03 LAB — POTASSIUM: Potassium: 4.5 mmol/L (ref 3.5–5.1)

## 2017-01-03 LAB — D-DIMER, QUANTITATIVE (NOT AT ARMC): D DIMER QUANT: 0.95 ug{FEU}/mL — AB (ref 0.00–0.50)

## 2017-01-03 MED ORDER — ACETAMINOPHEN 500 MG PO TABS: 1000.0000 mg | ORAL_TABLET | Freq: Three times a day (TID) | ORAL | 0 refills | Status: AC

## 2017-01-03 MED ORDER — IOPAMIDOL (ISOVUE-370) INJECTION 76%
INTRAVENOUS | Status: AC
  Administered 2017-01-03: 77 mL via INTRAVENOUS
  Filled 2017-01-03: qty 100

## 2017-01-03 MED ORDER — ACETAMINOPHEN 500 MG PO TABS
1000.0000 mg | ORAL_TABLET | Freq: Once | ORAL | Status: AC
  Administered 2017-01-03: 1000 mg via ORAL
  Filled 2017-01-03: qty 2

## 2017-01-03 NOTE — Progress Notes (Signed)
VASCULAR LAB PRELIMINARY  PRELIMINARY  PRELIMINARY  PRELIMINARY  Left upper extremity venous duplex completed.    Preliminary report:  There is no DVT or SVT noted in the left lower extremity.   Called report.  Kaytlen Lightsey, RVT 01/03/2017, 9:14 AM

## 2017-01-03 NOTE — ED Notes (Signed)
Called to reassess pt's vitals, no answer times 3.

## 2017-01-03 NOTE — ED Provider Notes (Signed)
[facility] DEPT Provider Note   CSN: [id number] Arrival date & time: 01/02/17  2127     History   Chief Complaint Chief Complaint  Patient presents with  . Arm Pain    HPI Sherri Burns is a 81 y.o. female.  Patient reports ongoing pain in her left arm for the past one week. She believes this is related to when she had an IV in her hand in May when she had her gallbladder out. Over the past week the pain has radiated up her left arm centering around her elbow involving her upper arm as well. Denies any falls or trauma. Feels that her left arm is more swollen compared to her right. Has not had a fever or vomiting. No history of blood clots. Patient did have episode of pain in her left lateral ribs last night it lasted for a few minutes and has since resolved. Denies any chest pain or shortness of breath. Denies any nausea or vomiting. Denies any cardiac history. Denies any focal weakness, numbness or tingling.   The history is provided by the patient.  Arm Pain  Pertinent negatives include no chest pain, no abdominal pain, no headaches and no shortness of breath.    Past Medical History:  Diagnosis Date  . Abnormal EKG 02/07/2016   Inferolateral T wave inversion and ST depression.  Marland Kitchen Anxiety   . Arthritis   . Cancer ([place]) 15 yrs ago   ho renal s/p nephrectomy  . Colon polyps 11/2008   tubular adenoma  . Diabetes mellitus   . Elevated serum creatinine   . Elevated TSH   . Esophageal problem    esophageal dilation  . GERD (gastroesophageal reflux disease)    + hpylori  . Headache   . Hyperlipidemia   . Hypertension   . Hypothyroidism   . Ischemic colitis ([place])   . Renal insufficiency     Patient Active Problem List   Diagnosis Date Noted  . Abnormal EKG 02/07/2016  . Acute cholecystitis   . Acute calculous cholecystitis   . Cholecystitis 12/29/2015  . Type 2 diabetes mellitus with diabetic polyneuropathy, without long-term current use of insulin ([place])  09/23/2012  . Essential hypertension   . Hypothyroidism   . CKD (chronic kidney disease), stage III     Past Surgical History:  Procedure Laterality Date  . ABDOMINAL HYSTERECTOMY  1978   partial  . CHOLECYSTECTOMY N/A 04/04/2016   Procedure: LAPAROSCOPIC CHOLECYSTECTOMY;  Surgeon: Autumn Messing III, MD;  Location: [facility];  Service: General;  Laterality: N/A;  . COLONOSCOPY    . ESOPHAGEAL DILATION  2016  . HERNIA REPAIR    . LAPAROSCOPIC CHOLECYSTECTOMY  04/04/2016  . NEPHRECTOMY Right     OB History    No data available       Home Medications    Prior to Admission medications   Medication Sig Start Date End Date Taking? Authorizing Provider  aspirin EC 81 MG tablet Take 81 mg by mouth daily.    Historical Provider, MD  cholecalciferol (VITAMIN D) 1000 units tablet Take 1,000 Units by mouth daily.    Historical Provider, MD  clonazePAM (KLONOPIN) 1 MG tablet TAKE HALF TABLET DAILY IN THE MORNING AND 1/2 TABLET AT NOON TIME THEN TAKE ONE TABLET AT BEDTIME DAILY 12/14/16   Shawnee Knapp, MD  Colloidal Oatmeal (GOLD BOND ULTRA ECZEMA RELIEF EX) Apply 1 application topically daily.    Historical Provider, MD  diltiazem (CARDIZEM CD) 240 MG 24  hr capsule TAKE 1 CAPSULE BY MOUTH DAILY 12/09/16   Shawnee Knapp, MD  fluticasone [facility]) 50 MCG/ACT nasal spray Place 2 sprays into both nostrils daily. Patient taking differently: Place 2 sprays into both nostrils daily as needed for allergies.  01/04/16   [name], MD  furosemide (LASIX) 40 MG tablet Take 1 tablet (40 mg total) by mouth daily. 01/04/16   [name], MD  glipiZIDE (GLUCOTROL XL) 10 MG 24 hr tablet Take 1 tablet (10 mg total) by mouth 2 (two) times daily. 10/12/16   Shawnee Knapp, MD  levothyroxine (SYNTHROID, LEVOTHROID) 75 MCG tablet Take 1 tablet (75 mcg total) by mouth daily before breakfast. 01/04/16   [name], MD  losartan (COZAAR) 25 MG tablet Take 1 tablet (25 mg total) by mouth daily. 10/12/16   Shawnee Knapp, MD  metoprolol  (LOPRESSOR) 100 MG tablet TAKE 1 TABLET BY MOUTH TWICE A DAY 06/24/16   [name], MD  omeprazole (PRILOSEC) 40 MG capsule Take 1 capsule (40 mg total) by mouth daily. Patient taking differently: Take 40 mg by mouth daily before breakfast.  01/04/16   [name], MD  ondansetron (ZOFRAN) 4 MG tablet Take 1 tablet (4 mg total) by mouth every 6 (six) hours. 09/01/16   Frederica Kuster, PA-C  polyethylene glycol (MIRALAX / GLYCOLAX) packet Take 8.5 g by mouth every morning.     Historical Provider, MD  sucralfate (CARAFATE) 1 GM/10ML suspension Take 10 mLs (1 g total) by mouth 4 (four) times daily -  with meals and at bedtime. 09/01/16   Frederica Kuster, PA-C  vitamin E 100 UNIT capsule Take 100 Units by mouth daily.    Historical Provider, MD    Family History Family History  Problem Relation Age of Onset  . Cancer Mother     colon, kidney cancer  . Cancer Father     throat cancer  . Cancer Sister   . Heart attack Brother     Social History Social History  Substance Use Topics  . Smoking status: Current Some Day Smoker    Packs/day: 0.25    Years: 55.00    Types: Cigarettes  . Smokeless tobacco: Never Used     Comment: cut back amount still  trying to quit  . Alcohol use No     Allergies   Codeine; Ciprocin-fluocin-procin [fluocinolone acetonide]; Clonidine derivatives; Crestor [rosuvastatin calcium]; Penicillins; Simvastatin; and Tessalon perles   Review of Systems Review of Systems  Constitutional: Negative for activity change, appetite change and fever.  HENT: Negative for congestion and rhinorrhea.   Respiratory: Negative for cough, chest tightness and shortness of breath.   Cardiovascular: Negative for chest pain and leg swelling.  Gastrointestinal: Negative for abdominal pain, nausea and vomiting.  Genitourinary: Negative for dysuria, hematuria, vaginal bleeding and vaginal discharge.  Musculoskeletal: Positive for arthralgias, joint swelling and myalgias. Negative  for back pain.  Neurological: Negative for dizziness, weakness, light-headedness and headaches.   A complete 10 system review of systems was obtained and all systems are negative except as noted in the HPI and PMH.    Physical Exam Updated Vital Signs BP 146/79 (BP Location: Right Arm)   Pulse 65   Temp 98.7 F (37.1 C) (Oral)   Resp 16   Ht 5\' 6"  (1.676 m)   SpO2 98%   Physical Exam  Constitutional: She is oriented to person, place, and time. She appears well-developed and well-nourished. No distress.  HENT:  Head: Normocephalic and atraumatic.  Mouth/Throat: Oropharynx is clear and moist. No oropharyngeal exudate.  Eyes: Conjunctivae and EOM are normal. Pupils are equal, round, and reactive to light.  Neck: Normal range of motion. Neck supple.  No meningismus.  Cardiovascular: Normal rate, regular rhythm, normal heart sounds and intact distal pulses.   No murmur heard. Pulmonary/Chest: Effort normal and breath sounds normal. No respiratory distress.  Abdominal: Soft. There is no tenderness. There is no rebound and no guarding.  Musculoskeletal: Normal range of motion. She exhibits edema and tenderness.  Left arm appears normal in appearance. There is no overt swelling or erythema. Intact radial pulse and cardinal hand movements. Full range of motion of left shoulder, elbow and hand. Pain with range of motion of left elbow. Compartments are soft.  Neurological: She is alert and oriented to person, place, and time. No cranial nerve deficit. She exhibits normal muscle tone. Coordination normal.  No ataxia on finger to nose bilaterally. No pronator drift. 5/5 strength throughout. CN 2-12 intact.Equal grip strength. Sensation intact.   Skin: Skin is warm.  Psychiatric: She has a normal mood and affect. Her behavior is normal.  Nursing note and vitals reviewed.    ED Treatments / Results  Labs (all labs ordered are listed, but only abnormal results are displayed) Labs Reviewed    BASIC METABOLIC PANEL - Abnormal; Notable for the following:       Result Value   Potassium 5.7 (*)    Chloride 100 (*)    Glucose, Bld 148 (*)    Creatinine, Ser 1.35 (*)    GFR calc non Af Amer 37 (*)    GFR calc Af Amer 42 (*)    All other components within normal limits  CBC  I-STAT TROPOININ, ED    EKG  EKG Interpretation  Date/Time:  Wednesday January 02 2017 22:48:28 EST Ventricular Rate:  58 PR Interval:  190 QRS Duration: 98 QT Interval:  406 QTC Calculation: 398 R Axis:   -6 Text Interpretation:  Sinus bradycardia ST & T wave abnormality, consider inferior ischemia ST & T wave abnormality, consider anterolateral ischemia Abnormal ECG No STEMI. Similar to prior Confirmed by LONG MD, JOSHUA [phone]) on 01/02/2017 10:52:54 PM       Radiology Dg Chest 2 View  Result Date: 01/02/2017 CLINICAL DATA:  Left arm pain and swelling EXAM: CHEST  2 VIEW COMPARISON:  CT 12/20/2009 FINDINGS: The heart size and mediastinal contours are within normal limits. Both lungs are clear. Degenerative changes of the spine. Multiple surgical clips in the right upper quadrant. IMPRESSION: No radiographic evidence for acute cardiopulmonary abnormality. Electronically Signed   By: Donavan Foil M.D.   On: 01/02/2017 23:04    Procedures Procedures (including critical care time)  Medications Ordered in ED Medications - No data to display   Initial Impression / Assessment and Plan / ED Course  I have reviewed the triage vital signs and the nursing notes.  Pertinent labs & imaging results that were available during my care of the patient were reviewed by me and considered in my medical decision making (see chart for details).     1 week of L arm pain, patient believes from IV placement in May.  No deformity or erythema.  Perhaps minimal swelling. ROM intact and NVI. L elbow Xray negative. No evidence of septic joint.   Labs show hyperkalemia which was normal on recheck EKG abnormal but  unchanged. Troponin negative.  D-dimer obtained due to  pleuritic chest pain.  Will also r/o DVT.   Troponin negative x2.  Doubt ACS.  CTPE and doppler of LUE pending at time of sign out.   Final Clinical Impressions(s) / ED Diagnoses   Final diagnoses:  None    New Prescriptions New Prescriptions   No medications on file     Ezequiel Essex, MD 01/03/17 [phone]

## 2017-01-03 NOTE — ED Provider Notes (Signed)
I assumed care of this patient from Dr. Wyvonnia Dusky at 0700.  Please see their note for further details of Hx, PE.  Briefly patient is a 81 y.o. female with a Arm Pain  that is concerning for DVT/PE.  Current plan is to follow up on CTA PE and LUE doppler. Will also obtain 3rd trop to r/o ACS.  CTA, doppler, and Trop negative. On reassessment, pt provided with tylenol for pain. It seems to be joint related, but no h/o trauma thus no plain film required. The patient is safe for discharge with strict return precautions with PCP follow up.  Disposition: Discharge  Condition: Good  I have discussed the results, Dx and Tx plan with the patient who expressed understanding and agree(s) with the plan. Discharge instructions discussed at great length. The patient was given strict return precautions who verbalized understanding of the instructions. No further questions at time of discharge.    New Prescriptions   ACETAMINOPHEN (TYLENOL) 500 MG TABLET    Take 2 tablets (1,000 mg total) by mouth every 8 (eight) hours. Do not take more than 4000 mg of acetaminophen (Tylenol) in a 24-hour period. Please note that other medicines that you may be prescribed may have Tylenol as well.    Follow Up: primary care provider  Schedule an appointment as soon as possible for a visit in 5 days If symptoms do not improve or  worsen         Fatima Blank, MD 01/03/17 1013

## 2017-01-26 DIAGNOSIS — E119 Type 2 diabetes mellitus without complications: Secondary | ICD-10-CM | POA: Diagnosis not present

## 2017-01-28 ENCOUNTER — Other Ambulatory Visit: Payer: Self-pay | Admitting: Emergency Medicine

## 2017-01-28 DIAGNOSIS — R601 Generalized edema: Secondary | ICD-10-CM

## 2017-01-28 DIAGNOSIS — R739 Hyperglycemia, unspecified: Secondary | ICD-10-CM

## 2017-01-28 DIAGNOSIS — E038 Other specified hypothyroidism: Secondary | ICD-10-CM

## 2017-01-29 MED ORDER — ONDANSETRON HCL 4 MG PO TABS: 4.0000 mg | ORAL_TABLET | Freq: Four times a day (QID) | ORAL | 1 refills | Status: DC

## 2017-01-29 MED ORDER — METOPROLOL TARTRATE 100 MG PO TABS: 100.0000 mg | ORAL_TABLET | Freq: Two times a day (BID) | ORAL | 1 refills | Status: DC

## 2017-01-29 MED ORDER — GLIPIZIDE ER 10 MG PO TB24: 10.0000 mg | ORAL_TABLET | Freq: Two times a day (BID) | ORAL | 1 refills | Status: DC

## 2017-01-29 NOTE — Telephone Encounter (Signed)
Spoke with patient. PCP is now Dr. Brigitte Pulse.  Financial difficulties now. Car is in the shop. Relies on friends to bring her to appointments.  Refilled medications. Provided dates that Dr. Brigitte Pulse works on Saturdays. Patient plans to call on 3/24 to schedule for 3/30.  Meds ordered this encounter  Medications  . omeprazole (PRILOSEC) 40 MG capsule    Sig: TAKE 1 CAPSULE BY MOUTH DAILY    Dispense:  90 capsule    Refill:  1  . levothyroxine (SYNTHROID, LEVOTHROID) 75 MCG tablet    Sig: TAKE 1 TABLET BY MOUTH DAILY BEFORE BREAKFAST    Dispense:  90 tablet    Refill:  1  . glipiZIDE (GLUCOTROL XL) 10 MG 24 hr tablet    Sig: Take 1 tablet (10 mg total) by mouth 2 (two) times daily.    Dispense:  180 tablet    Refill:  1    Order Specific Question:   Supervising Provider    Answer:   SHAW, EVA N [[id number]]  . metoprolol (LOPRESSOR) 100 MG tablet    Sig: Take 1 tablet (100 mg total) by mouth 2 (two) times daily.    Dispense:  180 tablet    Refill:  1    Order Specific Question:   Supervising Provider    Answer:   SHAW, EVA N [[id number]]  . ondansetron (ZOFRAN) 4 MG tablet    Sig: Take 1 tablet (4 mg total) by mouth every 6 (six) hours.    Dispense:  12 tablet    Refill:  1    Order Specific Question:   Supervising Provider    Answer:   Brigitte Pulse, EVA N [[id number]]

## 2017-02-23 ENCOUNTER — Ambulatory Visit (INDEPENDENT_AMBULATORY_CARE_PROVIDER_SITE_OTHER): Payer: Medicare Other | Admitting: Family Medicine

## 2017-02-23 VITALS — BP 158/82 | HR 76 | Temp 98.0°F | Resp 20 | Ht 64.75 in | Wt 178.4 lb

## 2017-02-23 DIAGNOSIS — M79602 Pain in left arm: Secondary | ICD-10-CM | POA: Diagnosis not present

## 2017-02-23 DIAGNOSIS — G8929 Other chronic pain: Secondary | ICD-10-CM | POA: Diagnosis not present

## 2017-02-23 DIAGNOSIS — L659 Nonscarring hair loss, unspecified: Secondary | ICD-10-CM

## 2017-02-23 DIAGNOSIS — E785 Hyperlipidemia, unspecified: Secondary | ICD-10-CM

## 2017-02-23 DIAGNOSIS — E118 Type 2 diabetes mellitus with unspecified complications: Secondary | ICD-10-CM | POA: Diagnosis not present

## 2017-02-23 DIAGNOSIS — M545 Low back pain, unspecified: Secondary | ICD-10-CM

## 2017-02-23 DIAGNOSIS — R5383 Other fatigue: Secondary | ICD-10-CM | POA: Diagnosis not present

## 2017-02-23 DIAGNOSIS — R102 Pelvic and perineal pain: Secondary | ICD-10-CM | POA: Diagnosis not present

## 2017-02-23 DIAGNOSIS — R601 Generalized edema: Secondary | ICD-10-CM | POA: Diagnosis not present

## 2017-02-23 LAB — POC MICROSCOPIC URINALYSIS (UMFC): MUCUS RE: ABSENT

## 2017-02-23 LAB — POCT URINALYSIS DIP (MANUAL ENTRY)
BILIRUBIN UA: NEGATIVE
BILIRUBIN UA: NEGATIVE
Blood, UA: NEGATIVE
GLUCOSE UA: NEGATIVE
LEUKOCYTES UA: NEGATIVE
NITRITE UA: NEGATIVE
Protein Ur, POC: 30 — AB
Spec Grav, UA: 1.02 (ref 1.030–1.035)
Urobilinogen, UA: 0.2 (ref ?–2.0)
pH, UA: 7 (ref 5.0–8.0)

## 2017-02-23 LAB — POCT GLYCOSYLATED HEMOGLOBIN (HGB A1C): Hemoglobin A1C: 7.9

## 2017-02-23 MED ORDER — LOSARTAN POTASSIUM 25 MG PO TABS: 25.0000 mg | ORAL_TABLET | Freq: Every day | ORAL | 1 refills | Status: DC

## 2017-02-23 MED ORDER — FUROSEMIDE 40 MG PO TABS: 40.0000 mg | ORAL_TABLET | Freq: Every day | ORAL | 1 refills | Status: DC

## 2017-02-23 NOTE — Patient Instructions (Addendum)
Please schedule an appointment with me asap to check on your ovarian pain - we need to do a pelvic exam to see if your ovaries are causing pain as this would mean that something is wrong. We might have time to address your various pain concerns at that time as well if you are scheduled for a 30 minutes appointment (ask for this when you schedule).   IF you received an x-ray today, you will receive an invoice from [facility]. Please contact [facility] at [phone] with questions or concerns regarding your invoice.   IF you received labwork today, you will receive an invoice from [facility]. Please contact [facility] at [phone] with questions or concerns regarding your invoice.   Our billing staff will not be able to assist you with questions regarding bills from these companies.  You will be contacted with the lab results as soon as they are available. The fastest way to get your results is to activate your My Chart account. Instructions are located on the last page of this paperwork. If you have not heard from [place] regarding the results in 2 weeks, please contact this office.

## 2017-02-23 NOTE — Progress Notes (Signed)
Subjective:  By signing my name below, I, Moises Blood, attest that this documentation has been prepared under the direction and in the presence of Delman Cheadle, MD. Electronically Signed: Moises Blood, [place]. 02/23/2017 , 2:48 PM .  Patient was seen in Room 3 .   Patient ID: Sherri Burns, female    DOB: [birth year], 81 y.o.   MRN: [id number] Chief Complaint  Patient presents with  . Follow-up    Multiple Issues   HPI Sherri Burns is a 81 y.o. female who presents to Primary Care at [facility] for follow up on multiple issues. She is fasting today. She did take her medications today.   Her A1C is 7.9 today, up from 7.3 in Nov 2017. She informs eating about 2 sandwiches a day. She states she can't eat whole grain bread due to gall bladder removed. For breakfast, she changes between oatmeal and sausage, with egg and cheese sandwich, and miralax mixed into her coffee. She has an occasional apple sauce as well. She denies eating a lot of sweets. She hasn't been checking her blood sugar at home. Patient was started on Metformin in June 2016, by Dr. Everlene Farrier. She had kidney cancer in 2010, and had it removed by Dr. Rosana Hoes.   She also mentions feeling stressed lately, with her house being up for sale. Her left arm has been hurting, not sleeping well at night, and back stiffness in the morning. She feels nervous being in her house alone, especially with the society troubles globally. She believes her mood improve after her house is sold. Her husband passed away on 07/07/14, and left her with a lot of financial burdens.   She's been taking tylenol for her left arm pain. When she was taking care of her husband, she had to assist him by lifting him in and out of the bath and toilet. She believes this was a contributing factor to her back pain.   She's still taking lasix daily. She was previously taking potassium supplement, but hasn't been taking it for 2 months. She notes having some headaches,  but believes it is due to allergies with some rhinorrhea. She's been using flonase, as she's had relief in the past with it. Her ears have also been itching and states "she applied antibiotics in them for relief". She denies checking her BP outside the office.   She also reports occasional fatigue during the day. She also mentions occasional low abdominal pain. She had hysterectomy done, and left her ovaries. She believes it could be "ovarian pain". Her last pelvic exam was before her husband's passing. She's been taking prilosec first thing in the morning daily. She also complains having hair loss, "coming out by the strand".   She previously took Crestor and simvastatin for her cholesterol.   Past Medical History:  Diagnosis Date  . Abnormal EKG 02/07/2016   Inferolateral T wave inversion and ST depression.  Marland Kitchen Anxiety   . Arthritis   . Cancer ([place]) 15 yrs ago   ho renal s/p nephrectomy  . Colon polyps 11/2008   tubular adenoma  . Diabetes mellitus   . Elevated serum creatinine   . Elevated TSH   . Esophageal problem    esophageal dilation  . GERD (gastroesophageal reflux disease)    + hpylori  . Headache   . Hyperlipidemia   . Hypertension   . Hypothyroidism   . Ischemic colitis ([place])   . Renal insufficiency    Prior to Admission  medications   Medication Sig Start Date End Date Taking? Authorizing Provider  aspirin EC 81 MG tablet Take 81 mg by mouth daily.    Historical Provider, MD  cholecalciferol (VITAMIN D) 1000 units tablet Take 1,000 Units by mouth daily.    Historical Provider, MD  clonazePAM (KLONOPIN) 1 MG tablet TAKE HALF TABLET DAILY IN THE MORNING AND 1/2 TABLET AT NOON TIME THEN TAKE ONE TABLET AT BEDTIME DAILY 12/14/16   Shawnee Knapp, MD  diltiazem (CARDIZEM CD) 240 MG 24 hr capsule TAKE 1 CAPSULE BY MOUTH DAILY 12/09/16   Shawnee Knapp, MD  fluticasone (FLONASE) 50 MCG/ACT nasal spray Place 2 sprays into both nostrils daily. Patient taking differently: Place 2 sprays  into both nostrils daily as needed for allergies.  01/04/16   [name], MD  furosemide (LASIX) 40 MG tablet Take 1 tablet (40 mg total) by mouth daily. 01/04/16   [name], MD  glipiZIDE (GLUCOTROL XL) 10 MG 24 hr tablet Take 1 tablet (10 mg total) by mouth 2 (two) times daily. 01/29/17   Chelle Jeffery, PA-C  levothyroxine (SYNTHROID, LEVOTHROID) 75 MCG tablet TAKE 1 TABLET BY MOUTH DAILY BEFORE BREAKFAST 01/29/17   Chelle Jeffery, PA-C  losartan (COZAAR) 25 MG tablet Take 1 tablet (25 mg total) by mouth daily. 10/12/16   Shawnee Knapp, MD  metoprolol (LOPRESSOR) 100 MG tablet Take 1 tablet (100 mg total) by mouth 2 (two) times daily. 01/29/17   Chelle Jeffery, PA-C  omeprazole (PRILOSEC) 40 MG capsule TAKE 1 CAPSULE BY MOUTH DAILY 01/29/17   Chelle Jeffery, PA-C  ondansetron (ZOFRAN) 4 MG tablet Take 1 tablet (4 mg total) by mouth every 6 (six) hours. 01/29/17   Chelle Jeffery, PA-C  polyethylene glycol (MIRALAX / GLYCOLAX) packet Take 8.5 g by mouth every morning.     Historical Provider, MD  vitamin E 100 UNIT capsule Take 100 Units by mouth daily.    Historical Provider, MD   Allergies  Allergen Reactions  . Codeine Other (See Comments)    hallucinations  . Ciprocin-Fluocin-Procin [Fluocinolone Acetonide] Other (See Comments)    Unknown reaction  . Clonidine Derivatives Other (See Comments)    Dry mouth  . Crestor [Rosuvastatin Calcium] Other (See Comments)    cramps  . Penicillins Other (See Comments)    Has patient had a PCN reaction causing immediate rash, facial/tongue/throat swelling, SOB or lightheadedness with hypotension: no Has patient had a PCN reaction causing severe rash involving mucus membranes or skin necrosis: no Has patient had a PCN reaction that required hospitalization : no Has patient had a PCN reaction occurring within the last 10 years: no -Hallucinates If all of the above answers are "NO", then may proceed with Cephalosporin use.   . Simvastatin Other (See Comments)     Upset stomach   . Tessalon Perles Other (See Comments)    Gi upset    Review of Systems  Constitutional: Positive for fatigue. Negative for chills, fever and unexpected weight change.  HENT: Positive for rhinorrhea.   Respiratory: Negative for chest tightness and shortness of breath.   Cardiovascular: Negative for chest pain, palpitations and leg swelling.  Gastrointestinal: Positive for abdominal pain. Negative for blood in stool.  Musculoskeletal: Positive for back pain and myalgias.  Allergic/Immunologic: Positive for environmental allergies.  Neurological: Positive for headaches. Negative for dizziness, syncope and light-headedness.       Objective:   Physical Exam  Constitutional: She is oriented to person, place, and  time. She appears well-developed and well-nourished. No distress.  HENT:  Head: Normocephalic and atraumatic.  Right Ear: Tympanic membrane is retracted.  Left Ear: Tympanic membrane is retracted.  Nares with erythema  Eyes: EOM are normal. Pupils are equal, round, and reactive to light.  Neck: Neck supple.  Cardiovascular: Normal rate, regular rhythm, S1 normal, S2 normal and normal heart sounds.   No murmur heard. Pulmonary/Chest: Effort normal and breath sounds normal. No respiratory distress. She has no wheezes.  Musculoskeletal: Normal range of motion.  Neurological: She is alert and oriented to person, place, and time.  Skin: Skin is warm and dry.  Psychiatric: She has a normal mood and affect. Her behavior is normal.  Nursing note and vitals reviewed.   BP (!) 158/82   Pulse 76   Temp 98 F (36.7 C) (Oral)   Resp 20   Ht 5' 4.75" (1.645 m)   Wt 178 lb 6 oz (80.9 kg)   SpO2 97%   BMI 29.91 kg/m    Results for orders placed or performed in visit on 02/23/17  POCT glycosylated hemoglobin (Hb A1C)  Result Value Ref Range   Hemoglobin A1C 7.9    BP recheck in room, sitting (manual, left arm): 130/72    Assessment & Plan:   1. Type 2  diabetes mellitus with complication, without long-term current use of insulin ([place])   2. Generalized edema   3. Fatigue, unspecified type   4. Hair loss   5. Pelvic pain   6. Chronic right-sided low back pain without sciatica   7. Left arm pain   8. Hyperlipidemia, unspecified hyperlipidemia type     Orders Placed This Encounter  Procedures  . Comprehensive metabolic panel  . Lipid panel    Order Specific Question:   Has the patient fasted?    Answer:   Yes  . Thyroid Panel With TSH  . CBC with Differential/Platelet  . Specimen Status  . CBC with Differential/Platelet  . Thyroid Panel With TSH  . Specimen status report  . Hemoglobin A1c  . Specimen status report  . POCT glycosylated hemoglobin (Hb A1C)  . POCT urinalysis dipstick  . POCT Microscopic Urinalysis ([facility])    Meds ordered this encounter  Medications  . furosemide (LASIX) 40 MG tablet    Sig: Take 1 tablet (40 mg total) by mouth daily.    Dispense:  90 tablet    Refill:  1  . losartan (COZAAR) 25 MG tablet    Sig: Take 1 tablet (25 mg total) by mouth daily.    Dispense:  90 tablet    Refill:  1    I personally performed the services described in this documentation, which was scribed in my presence. The recorded information has been reviewed and considered, and addended by me as needed.   Delman Cheadle, M.D.  Primary Care at [facility] [address] [place], [place] [address] [phone] phone [phone] fax  03/07/17 2:41 PM

## 2017-02-24 LAB — SPECIMEN STATUS

## 2017-02-25 LAB — COMPREHENSIVE METABOLIC PANEL
A/G RATIO: 1.4 (ref 1.2–2.2)
ALBUMIN: 4.3 g/dL (ref 3.5–4.8)
ALK PHOS: 67 IU/L (ref 39–117)
ALT: 16 IU/L (ref 0–32)
AST: 16 IU/L (ref 0–40)
BILIRUBIN TOTAL: 0.4 mg/dL (ref 0.0–1.2)
BUN / CREAT RATIO: 14 (ref 12–28)
BUN: 18 mg/dL (ref 8–27)
CHLORIDE: 101 mmol/L (ref 96–106)
CO2: 25 mmol/L (ref 18–29)
Calcium: 9.7 mg/dL (ref 8.7–10.3)
Creatinine, Ser: 1.31 mg/dL — ABNORMAL HIGH (ref 0.57–1.00)
GFR calc non Af Amer: 39 mL/min/{1.73_m2} — ABNORMAL LOW (ref >59)
GFR, EST AFRICAN AMERICAN: 45 mL/min/{1.73_m2} — AB (ref >59)
GLOBULIN, TOTAL: 3 g/dL (ref 1.5–4.5)
Glucose: 128 mg/dL — ABNORMAL HIGH (ref 65–99)
Potassium: 4.9 mmol/L (ref 3.5–5.2)
SODIUM: 144 mmol/L (ref 134–144)
TOTAL PROTEIN: 7.3 g/dL (ref 6.0–8.5)

## 2017-02-25 LAB — LIPID PANEL
Chol/HDL Ratio: 6.6 ratio — ABNORMAL HIGH (ref 0.0–4.4)
Cholesterol, Total: 244 mg/dL — ABNORMAL HIGH (ref 100–199)
HDL: 37 mg/dL — ABNORMAL LOW (ref >39)
LDL Calculated: 131 mg/dL — ABNORMAL HIGH (ref 0–99)
Triglycerides: 380 mg/dL — ABNORMAL HIGH (ref 0–149)
VLDL CHOLESTEROL CAL: 76 mg/dL — AB (ref 5–40)

## 2017-02-25 LAB — THYROID PANEL WITH TSH

## 2017-02-25 LAB — CBC WITH DIFFERENTIAL/PLATELET

## 2017-02-26 LAB — THYROID PANEL WITH TSH
FREE THYROXINE INDEX: 2 (ref 1.2–4.9)
T3 UPTAKE RATIO: 29 % (ref 24–39)
T4, Total: 7 ug/dL (ref 4.5–12.0)
TSH: 2.14 u[IU]/mL (ref 0.450–4.500)

## 2017-02-26 LAB — SPECIMEN STATUS REPORT

## 2017-02-26 LAB — CBC WITH DIFFERENTIAL/PLATELET
BASOS: 1 %
Basophils Absolute: 0 10*3/uL (ref 0.0–0.2)
EOS (ABSOLUTE): 0.2 10*3/uL (ref 0.0–0.4)
Eos: 3 %
HEMOGLOBIN: 13.1 g/dL (ref 11.1–15.9)
Hematocrit: 38.7 % (ref 34.0–46.6)
IMMATURE GRANS (ABS): 0 10*3/uL (ref 0.0–0.1)
IMMATURE GRANULOCYTES: 1 %
LYMPHS: 40 %
Lymphocytes Absolute: 2.6 10*3/uL (ref 0.7–3.1)
MCH: 32.6 pg (ref 26.6–33.0)
MCHC: 33.9 g/dL (ref 31.5–35.7)
MCV: 96 fL (ref 79–97)
MONOCYTES: 6 %
Monocytes Absolute: 0.4 10*3/uL (ref 0.1–0.9)
Neutrophils Absolute: 3.3 10*3/uL (ref 1.4–7.0)
Neutrophils: 49 %
Platelets: 201 10*3/uL (ref 150–379)
RBC: 4.02 x10E6/uL (ref 3.77–5.28)
RDW: 14.6 % (ref 12.3–15.4)
WBC: 6.5 10*3/uL (ref 3.4–10.8)

## 2017-03-05 LAB — HEMOGLOBIN A1C

## 2017-03-05 LAB — SPECIMEN STATUS REPORT

## 2017-03-07 ENCOUNTER — Ambulatory Visit (INDEPENDENT_AMBULATORY_CARE_PROVIDER_SITE_OTHER): Payer: Medicare Other

## 2017-03-07 ENCOUNTER — Ambulatory Visit (INDEPENDENT_AMBULATORY_CARE_PROVIDER_SITE_OTHER): Payer: Medicare Other | Admitting: Family Medicine

## 2017-03-07 ENCOUNTER — Encounter: Payer: Self-pay | Admitting: Family Medicine

## 2017-03-07 VITALS — BP 148/97 | HR 93 | Temp 98.2°F | Ht 64.75 in | Wt 176.4 lb

## 2017-03-07 DIAGNOSIS — R809 Proteinuria, unspecified: Secondary | ICD-10-CM

## 2017-03-07 DIAGNOSIS — R102 Pelvic and perineal pain: Secondary | ICD-10-CM

## 2017-03-07 DIAGNOSIS — G8929 Other chronic pain: Secondary | ICD-10-CM

## 2017-03-07 DIAGNOSIS — R739 Hyperglycemia, unspecified: Secondary | ICD-10-CM | POA: Diagnosis not present

## 2017-03-07 DIAGNOSIS — M545 Low back pain, unspecified: Secondary | ICD-10-CM

## 2017-03-07 DIAGNOSIS — I10 Essential (primary) hypertension: Secondary | ICD-10-CM

## 2017-03-07 DIAGNOSIS — Z1239 Encounter for other screening for malignant neoplasm of breast: Secondary | ICD-10-CM

## 2017-03-07 DIAGNOSIS — M1611 Unilateral primary osteoarthritis, right hip: Secondary | ICD-10-CM | POA: Diagnosis not present

## 2017-03-07 DIAGNOSIS — M1612 Unilateral primary osteoarthritis, left hip: Secondary | ICD-10-CM | POA: Diagnosis not present

## 2017-03-07 DIAGNOSIS — Z1231 Encounter for screening mammogram for malignant neoplasm of breast: Secondary | ICD-10-CM

## 2017-03-07 DIAGNOSIS — E1142 Type 2 diabetes mellitus with diabetic polyneuropathy: Secondary | ICD-10-CM

## 2017-03-07 LAB — POCT URINALYSIS DIP (MANUAL ENTRY)
BILIRUBIN UA: NEGATIVE mg/dL
Bilirubin, UA: NEGATIVE
GLUCOSE UA: NEGATIVE mg/dL
LEUKOCYTES UA: NEGATIVE
Nitrite, UA: NEGATIVE
PROTEIN UA: NEGATIVE mg/dL
Spec Grav, UA: <=1.005 — AB (ref 1.010–1.025)
Urobilinogen, UA: 0.2 E.U./dL
pH, UA: 5.5 (ref 5.0–8.0)

## 2017-03-07 LAB — IFOBT (OCCULT BLOOD): IMMUNOLOGICAL FECAL OCCULT BLOOD TEST: NEGATIVE

## 2017-03-07 MED ORDER — DILTIAZEM HCL ER COATED BEADS 360 MG PO CP24: 240.0000 mg | ORAL_CAPSULE | Freq: Every day | ORAL | 1 refills | Status: DC

## 2017-03-07 MED ORDER — CLONAZEPAM 1 MG PO TABS: ORAL_TABLET | ORAL | 3 refills | Status: DC

## 2017-03-07 NOTE — Patient Instructions (Signed)
     IF you received an x-ray today, you will receive an invoice from [facility]. Please contact [facility] at [phone] with questions or concerns regarding your invoice.   IF you received labwork today, you will receive an invoice from [facility]. Please contact [facility] at [phone] with questions or concerns regarding your invoice.   Our billing staff will not be able to assist you with questions regarding bills from these companies.  You will be contacted with the lab results as soon as they are available. The fastest way to get your results is to activate your My Chart account. Instructions are located on the last page of this paperwork. If you have not heard from us regarding the results in 2 weeks, please contact this office.     

## 2017-03-07 NOTE — Progress Notes (Signed)
Subjective:    Patient ID: Sherri Burns, female    DOB: 1936-01-21, 81 y.o.   MRN: [id number] Chief Complaint  Patient presents with  . Pelvic Pain    HPI She reports she has pain most on the right lower quandrant and can radiate around to the back and pubis gets puffy Had kidney removed on right Started many yrs ago - rpior to 2015 but has progressively worsening. Hurts constantly just more during the night but not every night - wakes her from sleep due to its severity and now has been keeping her awake all night at times. Is quite painful when she wakes in the morning.  Sometimes at night she will have more pain when she needs to urinate. She will use her bedside commode and she has urinary retention. No vaginal discharge. No vaginal bleeding.  She did date a man once but died last year. She uses miralax every morning in her coffee which keeps her bowels regular.  Occ left lower quadrant pain when she needs to use a BM.  She feels like she can feel knows with abd distention/swelling that has worsened since her cholecystecomy. SHe has increased pain with pressure at her wait and thinks her waist has increased. She occ has a left lateral breast pain. No mammogram since Dr. Kela Millin retried.   She also reports occasional fatigue during the day. She also mentions occasional low abdominal pain. She had hysterectomy done, and left her ovaries. She believes it could be "ovarian pain". Her last pelvic exam was before her husband's passing 04/2014. She's been taking prilosec first thing in the morning daily. She also complains having hair loss, "coming out by the strand".   CT of abdomen and pelvis October 2017 shows hepatic steatosis scattered colonic diverticulosis and mild nonspecific symmetric gastric mucosal thickening likely of infectious versus inflammatory etiology.  patient is status post hysterectomy and cholecystectomy.  Very stressed with selling the house and left arm bothering her.    Past Medical History:  Diagnosis Date  . Abnormal EKG 02/07/2016   Inferolateral T wave inversion and ST depression.  Marland Kitchen Anxiety   . Arthritis   . Cancer ([place]) 15 yrs ago   ho renal s/p nephrectomy  . Colon polyps 11/2008   tubular adenoma  . Diabetes mellitus   . Elevated serum creatinine   . Elevated TSH   . Esophageal problem    esophageal dilation  . GERD (gastroesophageal reflux disease)    + hpylori  . Headache   . Hyperlipidemia   . Hypertension   . Hypothyroidism   . Ischemic colitis ([place])   . Renal insufficiency    Past Surgical History:  Procedure Laterality Date  . ABDOMINAL HYSTERECTOMY  1978   partial  . CHOLECYSTECTOMY N/A 04/04/2016   Procedure: LAPAROSCOPIC CHOLECYSTECTOMY;  Surgeon: Autumn Messing III, MD;  Location: [facility];  Service: General;  Laterality: N/A;  . COLONOSCOPY    . ESOPHAGEAL DILATION  2016  . HERNIA REPAIR    . LAPAROSCOPIC CHOLECYSTECTOMY  04/04/2016  . NEPHRECTOMY Right    Current Outpatient Prescriptions on File Prior to Visit  Medication Sig Dispense Refill  . aspirin EC 81 MG tablet Take 81 mg by mouth daily.    . cholecalciferol (VITAMIN D) 1000 units tablet Take 1,000 Units by mouth daily.    . fluticasone (FLONASE) 50 MCG/ACT nasal spray Place 2 sprays into both nostrils daily. (Patient taking differently: Place 2 sprays into both nostrils daily as  needed for allergies. ) 16 g 10  . furosemide (LASIX) 40 MG tablet Take 1 tablet (40 mg total) by mouth daily. 90 tablet 1  . glipiZIDE (GLUCOTROL XL) 10 MG 24 hr tablet Take 1 tablet (10 mg total) by mouth 2 (two) times daily. 180 tablet 1  . levothyroxine (SYNTHROID, LEVOTHROID) 75 MCG tablet TAKE 1 TABLET BY MOUTH DAILY BEFORE BREAKFAST 90 tablet 1  . losartan (COZAAR) 25 MG tablet Take 1 tablet (25 mg total) by mouth daily. 90 tablet 1  . metoprolol (LOPRESSOR) 100 MG tablet Take 1 tablet (100 mg total) by mouth 2 (two) times daily. 180 tablet 1  . omeprazole (PRILOSEC) 40 MG capsule  TAKE 1 CAPSULE BY MOUTH DAILY 90 capsule 1  . ondansetron (ZOFRAN) 4 MG tablet Take 1 tablet (4 mg total) by mouth every 6 (six) hours. 12 tablet 1  . polyethylene glycol (MIRALAX / GLYCOLAX) packet Take 8.5 g by mouth every morning.     . vitamin E 100 UNIT capsule Take 100 Units by mouth daily.     No current facility-administered medications on file prior to visit.    Allergies  Allergen Reactions  . Codeine Other (See Comments)    hallucinations  . Ciprocin-Fluocin-Procin [Fluocinolone Acetonide] Other (See Comments)    Unknown reaction  . Clonidine Derivatives Other (See Comments)    Dry mouth  . Crestor [Rosuvastatin Calcium] Other (See Comments)    cramps  . Penicillins Other (See Comments)    Has patient had a PCN reaction causing immediate rash, facial/tongue/throat swelling, SOB or lightheadedness with hypotension: no Has patient had a PCN reaction causing severe rash involving mucus membranes or skin necrosis: no Has patient had a PCN reaction that required hospitalization : no Has patient had a PCN reaction occurring within the last 10 years: no -Hallucinates If all of the above answers are "NO", then may proceed with Cephalosporin use.   . Simvastatin Other (See Comments)    Upset stomach   . Tessalon Perles Other (See Comments)    Gi upset    Family History  Problem Relation Age of Onset  . Cancer Mother     colon, kidney cancer  . Cancer Father     throat cancer  . Cancer Sister   . Heart attack Brother    Social History   Social History  . Marital status: Widowed    Spouse name: N/A  . Number of children: N/A  . Years of education: N/A   Social History Main Topics  . Smoking status: Current Some Day Smoker    Packs/day: 0.25    Years: 55.00    Types: Cigarettes  . Smokeless tobacco: Never Used     Comment: cut back amount still  trying to quit  . Alcohol use No  . Drug use: No  . Sexual activity: Yes   Other Topics Concern  . None    Social History Narrative   Loss of son.   Depression screen [facility] 2/9 03/07/2017 02/23/2017 10/02/2016 06/08/2016 04/19/2016  Decreased Interest 2 0 0 0 0  Down, Depressed, Hopeless 3 3 3  0 0  PHQ - 2 Score 5 3 3  0 0  Altered sleeping 2 3 0 - -  Tired, decreased energy 1 3 1  - -  Change in appetite 0 1 3 - -  Feeling bad or failure about yourself  1 0 0 - -  Trouble concentrating 0 0 0 - -  Moving slowly or fidgety/restless  0 0 0 - -  Suicidal thoughts 0 0 0 - -  PHQ-9 Score 9 10 7  - -  Difficult doing work/chores Not difficult at all Not difficult at all Not difficult at all - -    Review of Systems See hpi    Objective:   Physical Exam  Constitutional: She is oriented to person, place, and time. She appears well-developed and well-nourished. No distress.  HENT:  Head: Normocephalic and atraumatic.  Neck: Normal range of motion. Neck supple. No thyromegaly present.  Cardiovascular: Normal rate, regular rhythm, normal heart sounds and intact distal pulses.   Pulmonary/Chest: Effort normal and breath sounds normal. No respiratory distress.  Abdominal: Soft. Bowel sounds are normal. She exhibits no distension. There is tenderness. There is no rebound, no guarding and no CVA tenderness.  Genitourinary: Rectum normal. Rectal exam shows no external hemorrhoid, no internal hemorrhoid, no mass, no tenderness and anal tone normal. Pelvic exam was performed with patient supine. There is no rash, tenderness or lesion on the right labia. There is no rash, tenderness or lesion on the left labia. Right adnexum displays tenderness and fullness. Right adnexum displays no mass. Left adnexum displays tenderness and fullness. Left adnexum displays no mass. No erythema or tenderness in the vagina. No vaginal discharge found.  Genitourinary Comments: Cervix and uterus surgically abscent  Musculoskeletal: She exhibits no edema.  Lymphadenopathy:    She has no cervical adenopathy.       Right: No inguinal  adenopathy present.       Left: No inguinal adenopathy present.  Neurological: She is alert and oriented to person, place, and time.  Skin: Skin is warm and dry. She is not diaphoretic. No erythema.  Psychiatric: She has a normal mood and affect. Her behavior is normal.      BP (!) 148/97 (BP Location: Left Arm, Patient Position: Sitting, Cuff Size: Small)   Pulse 93   Temp 98.2 F (36.8 C) (Oral)   Ht 5' 4.75" (1.645 m)   Wt 176 lb 6.4 oz (80 kg)   SpO2 98%   BMI 29.58 kg/m      Assessment & Plan:   1. Pelvic pain   2. Proteinuria, unspecified type   3. Chronic midline low back pain without sciatica   4. Hyperglycemia   5. Essential hypertension, benign   6. Screening for breast cancer   7.      TYpe 2 DM - 2 wks prior a1c 7.9 so increase glipizide from 10 bid to 20 bid (but have to change form ER to IR)  Orders Placed This Encounter  Procedures  . GC/Chlamydia Probe Amp  . Wet Prep for [employer], Clue  . DG Lumbar Spine 2-3 Views    Standing Status:   Future    Number of Occurrences:   1    Standing Expiration Date:   03/07/2018    Order Specific Question:   Reason for Exam (SYMPTOM  OR DIAGNOSIS REQUIRED)    Answer:   worsenig lumbar pain, spine tender to palpation, radiating anteriorly around lower abd to pelvis    Order Specific Question:   Preferred imaging location?    Answer:   External  . DG HIPS BILAT W OR W/O PELVIS 2V    Standing Status:   Future    Number of Occurrences:   1    Standing Expiration Date:   03/07/2018    Order Specific Question:   Reason for Exam (SYMPTOM  OR DIAGNOSIS  REQUIRED)    Answer:   worsenig lumbar pain, spine tender to palpation, radiating anteriorly around lower abd to pelvis    Order Specific Question:   Preferred imaging location?    Answer:   External  . US Pelvis Complete    Standing Status:   Future    Standing Expiration Date:   05/07/2018    Order Specific Question:   Reason for Exam (SYMPTOM  OR DIAGNOSIS REQUIRED)     Answer:   Rt>Lt pelvic pain    Order Specific Question:   Preferred imaging location?    Answer:   [facility]  . US Transvaginal Non-OB    Standing Status:   Future    Standing Expiration Date:   05/07/2018    Order Specific Question:   Reason for Exam (SYMPTOM  OR DIAGNOSIS REQUIRED)    Answer:   Rt>Lt pelvic pain    Order Specific Question:   Preferred imaging location?    Answer:   [facility]  . MM Digital Screening    Standing Status:   Future    Standing Expiration Date:   05/07/2018    Scheduling Instructions:     Please schedule for same appt as pelvic/tv US    Order Specific Question:   Reason for Exam (SYMPTOM  OR DIAGNOSIS REQUIRED)    Answer:   screening for breast cancer    Order Specific Question:   Preferred imaging location?    Answer:   [facility]  . POCT urinalysis dipstick  . IFOBT POC (occult bld, rslt in office)    Standing Status:   Future    Number of Occurrences:   1    Standing Expiration Date:   03/07/2018    Meds ordered this encounter  Medications  . diltiazem (CARDIZEM CD) 360 MG 24 hr capsule    Sig: Take 1 capsule (360 mg total) by mouth daily.    Dispense:  90 capsule    Refill:  1  . clonazePAM (KLONOPIN) 1 MG tablet    Sig: TAKE HALF TABLET DAILY IN THE MORNING AND 1/2 TABLET AT NOON TIME THEN TAKE ONE TABLET AT BEDTIME DAILY    Dispense:  60 tablet    Refill:  3      Delman Cheadle, M.D.  Primary Care at [facility] [place], [place] [address] [phone] phone [phone] fax  03/09/17 11:32 PM  Results for orders placed or performed in visit on 03/07/17  GC/Chlamydia Probe Amp  Result Value Ref Range   Chlamydia trachomatis, NAA Negative Negative   Neisseria gonorrhoeae by PCR Negative Negative  Wet Prep for [employer], Yeast, Clue  Result Value Ref Range   Trichomonas Exam Negative Negative   Yeast Exam Negative Negative   Clue Cell Exam Negative Negative  POCT urinalysis dipstick  Result  Value Ref Range   Color, UA yellow yellow   Clarity, UA clear clear   Glucose, UA negative negative mg/dL   Bilirubin, UA negative negative   Ketones, POC UA negative negative mg/dL   Spec Grav, UA <=1.005 (A) 1.010 - 1.025   Blood, UA trace-intact (A) negative   pH, UA 5.5 5.0 - 8.0   Protein Ur, POC negative negative mg/dL   Urobilinogen, UA 0.2 0.2 or 1.0 E.U./dL   Nitrite, UA Negative Negative   Leukocytes, UA Negative Negative  IFOBT POC (occult bld, rslt in office)  Result Value Ref Range   IFOBT Negative

## 2017-03-08 ENCOUNTER — Encounter: Payer: Self-pay | Admitting: Family Medicine

## 2017-03-08 LAB — GC/CHLAMYDIA PROBE AMP
Chlamydia trachomatis, NAA: NEGATIVE
Neisseria gonorrhoeae by PCR: NEGATIVE

## 2017-03-08 LAB — WET PREP FOR TRICH, YEAST, CLUE
Clue Cell Exam: NEGATIVE
Trichomonas Exam: NEGATIVE
Yeast Exam: NEGATIVE

## 2017-03-09 MED ORDER — GLIPIZIDE 10 MG PO TABS: 20.0000 mg | ORAL_TABLET | Freq: Two times a day (BID) | ORAL | 3 refills | Status: DC

## 2017-03-09 MED ORDER — METOPROLOL TARTRATE 100 MG PO TABS: 100.0000 mg | ORAL_TABLET | Freq: Two times a day (BID) | ORAL | 1 refills | Status: DC

## 2017-03-19 NOTE — Telephone Encounter (Signed)
Pt never picked up script for Zofran Script shredded

## 2017-04-29 DIAGNOSIS — E119 Type 2 diabetes mellitus without complications: Secondary | ICD-10-CM | POA: Diagnosis not present

## 2017-06-20 ENCOUNTER — Other Ambulatory Visit: Payer: Self-pay | Admitting: Family Medicine

## 2017-06-24 ENCOUNTER — Other Ambulatory Visit: Payer: Self-pay | Admitting: Family Medicine

## 2017-06-24 ENCOUNTER — Other Ambulatory Visit: Payer: Self-pay | Admitting: Physician Assistant

## 2017-06-24 DIAGNOSIS — R601 Generalized edema: Secondary | ICD-10-CM

## 2017-07-24 ENCOUNTER — Other Ambulatory Visit: Payer: Self-pay | Admitting: Family Medicine

## 2017-07-25 ENCOUNTER — Encounter: Payer: Self-pay | Admitting: Family Medicine

## 2017-07-25 ENCOUNTER — Ambulatory Visit (INDEPENDENT_AMBULATORY_CARE_PROVIDER_SITE_OTHER): Payer: Medicare Other | Admitting: Family Medicine

## 2017-07-25 VITALS — BP 148/83 | HR 70 | Temp 97.7°F | Resp 17 | Ht 64.75 in | Wt 171.2 lb

## 2017-07-25 DIAGNOSIS — K625 Hemorrhage of anus and rectum: Secondary | ICD-10-CM | POA: Diagnosis not present

## 2017-07-25 DIAGNOSIS — Z9109 Other allergy status, other than to drugs and biological substances: Secondary | ICD-10-CM

## 2017-07-25 DIAGNOSIS — D126 Benign neoplasm of colon, unspecified: Secondary | ICD-10-CM

## 2017-07-25 MED ORDER — FLUTICASONE PROPIONATE 50 MCG/ACT NA SUSP: 1.0000 | Freq: Every day | NASAL | 0 refills | Status: DC

## 2017-07-25 MED ORDER — METRONIDAZOLE 500 MG PO TABS: 500.0000 mg | ORAL_TABLET | Freq: Three times a day (TID) | ORAL | 0 refills | Status: DC

## 2017-07-25 MED ORDER — CIPROFLOXACIN HCL 500 MG PO TABS: 500.0000 mg | ORAL_TABLET | Freq: Two times a day (BID) | ORAL | 0 refills | Status: DC

## 2017-07-25 NOTE — Progress Notes (Signed)
8/30/20186:12 PM  Sherri Burns Aug 30, 1936, 81 y.o. female [id number]  Chief Complaint  Patient presents with  . Rectal Bleeding    Running like a faucet this morning, onset: x morning, dx of diverticulitis    HPI:   Patient is a 81 y.o. female with past medical history significant for diverticulosis and tubular adenoma who presents today for bright red rectal bleeding that started this morning. Patient states that for the past month she has not been feeling well, with intermittent lower abdominal pain, night sweats, intermittent low back pain, decreased appetite, minimal weight loss. 5 lbs since last visit in April.  Patient under significant amount of stress. She has had similar episodes before, always thought to be related to her diverticulitis. She has not had a repeat colonoscopy.    Depression screen [facility] 2/9 07/25/2017 03/07/2017 02/23/2017  Decreased Interest 3 2 0  Down, Depressed, Hopeless 3 3 3   PHQ - 2 Score 6 5 3   Altered sleeping 0 2 3  Tired, decreased energy 1 1 3   Change in appetite 1 0 1  Feeling bad or failure about yourself  0 1 0  Trouble concentrating 0 0 0  Moving slowly or fidgety/restless 0 0 0  Suicidal thoughts 0 0 0  PHQ-9 Score 8 9 10   Difficult doing work/chores - Not difficult at all Not difficult at all    Allergies  Allergen Reactions  . Codeine Other (See Comments)    hallucinations  . Ciprocin-Fluocin-Procin [Fluocinolone Acetonide] Other (See Comments)    Unknown reaction  . Clonidine Derivatives Other (See Comments)    Dry mouth  . Crestor [Rosuvastatin Calcium] Other (See Comments)    cramps  . Penicillins Other (See Comments)    Has patient had a PCN reaction causing immediate rash, facial/tongue/throat swelling, SOB or lightheadedness with hypotension: no Has patient had a PCN reaction causing severe rash involving mucus membranes or skin necrosis: no Has patient had a PCN reaction that required hospitalization : no Has patient  had a PCN reaction occurring within the last 10 years: no -Hallucinates If all of the above answers are "NO", then may proceed with Cephalosporin use.   . Simvastatin Other (See Comments)    Upset stomach   . Tessalon Perles Other (See Comments)    Gi upset     Current Outpatient Prescriptions on File Prior to Visit  Medication Sig Dispense Refill  . aspirin EC 81 MG tablet Take 81 mg by mouth daily.    . cholecalciferol (VITAMIN D) 1000 units tablet Take 1,000 Units by mouth daily.    . clonazePAM (KLONOPIN) 1 MG tablet TAKE HALF TABLET DAILY IN THE MORNING AND 1/2 TABLET AT NOON TIME THEN TAKE ONE TABLET AT BEDTIME DAILY 60 tablet 3  . diltiazem (CARDIZEM CD) 360 MG 24 hr capsule Take 1 capsule (360 mg total) by mouth daily. 90 capsule 1  . furosemide (LASIX) 40 MG tablet Take 1 tablet (40 mg total) by mouth daily. 90 tablet 1  . glipiZIDE (GLUCOTROL) 10 MG tablet TAKE TWO TABLETS (20 MG TOTAL) BY MOUTH TWO (TWO) TIMES DAILY BEFORE A MEAL. 30 MINUTES PRIOR 120 tablet 3  . levothyroxine (SYNTHROID, LEVOTHROID) 75 MCG tablet TAKE 1 TABLET BY MOUTH DAILY BEFORE BREAKFAST 90 tablet 1  . omeprazole (PRILOSEC) 40 MG capsule TAKE 1 CAPSULE BY MOUTH DAILY 90 capsule 0  . polyethylene glycol (MIRALAX / GLYCOLAX) packet Take 8.5 g by mouth every morning.     Marland Kitchen  vitamin E 100 UNIT capsule Take 100 Units by mouth daily.    Marland Kitchen losartan (COZAAR) 25 MG tablet TAKE 1 TABLET (25 MG TOTAL) BY MOUTH DAILY. 30 tablet 0  . metoprolol tartrate (LOPRESSOR) 100 MG tablet Take 1 tablet (100 mg total) by mouth 2 (two) times daily. 30 tablet 0  . ondansetron (ZOFRAN) 4 MG tablet Take 1 tablet (4 mg total) by mouth every 6 (six) hours. (Patient not taking: Reported on 07/25/2017) 12 tablet 1   No current facility-administered medications on file prior to visit.     Past Medical History:  Diagnosis Date  . Abnormal EKG 02/07/2016   Inferolateral T wave inversion and ST depression.  Marland Kitchen Anxiety   . Arthritis   .  Cancer ([place]) 15 yrs ago   ho renal s/p nephrectomy  . Colon polyps 11/2008   tubular adenoma  . Diabetes mellitus   . Elevated serum creatinine   . Elevated TSH   . Esophageal problem    esophageal dilation  . GERD (gastroesophageal reflux disease)    + hpylori  . Headache   . Hyperlipidemia   . Hypertension   . Hypothyroidism   . Ischemic colitis ([place])   . Renal insufficiency     Past Surgical History:  Procedure Laterality Date  . ABDOMINAL HYSTERECTOMY  1978   partial  . CHOLECYSTECTOMY N/A 04/04/2016   Procedure: LAPAROSCOPIC CHOLECYSTECTOMY;  Surgeon: Autumn Messing III, MD;  Location: [facility];  Service: General;  Laterality: N/A;  . COLONOSCOPY    . ESOPHAGEAL DILATION  2016  . HERNIA REPAIR    . LAPAROSCOPIC CHOLECYSTECTOMY  04/04/2016  . NEPHRECTOMY Right     Social History  Substance Use Topics  . Smoking status: Current Some Day Smoker    Packs/day: 0.25    Years: 55.00    Types: Cigarettes  . Smokeless tobacco: Never Used     Comment: cut back amount still  trying to quit  . Alcohol use No    Family History  Problem Relation Age of Onset  . Cancer Mother        colon, kidney cancer  . Cancer Father        throat cancer  . Cancer Sister   . Heart attack Brother     Review of Systems  Constitutional: Positive for chills, diaphoresis, malaise/fatigue and weight loss. Negative for fever.  Respiratory: Negative for cough and shortness of breath.   Cardiovascular: Negative for chest pain and palpitations.  Gastrointestinal: Positive for abdominal pain, blood in stool and diarrhea. Negative for heartburn, nausea and vomiting.     OBJECTIVE:  Blood pressure (!) 148/83, pulse 70, temperature 97.7 F (36.5 C), temperature source Oral, resp. rate 17, height 5' 4.75" (1.645 m), weight 171 lb 3.2 oz (77.7 kg), SpO2 100 %.  Physical Exam  Constitutional: She is oriented to person, place, and time and well-developed, well-nourished, and in no distress.  HENT:    Head: Normocephalic and atraumatic.  Mouth/Throat: Oropharynx is clear and moist. No oropharyngeal exudate.  Eyes: Pupils are equal, round, and reactive to light. EOM are normal. No scleral icterus.  Neck: Neck supple.  Cardiovascular: Normal rate, regular rhythm and normal heart sounds.  Exam reveals no gallop and no friction rub.   No murmur heard. Pulmonary/Chest: Effort normal and breath sounds normal. She has no wheezes. She has no rales.  Abdominal: Soft. She exhibits no distension and no mass. Bowel sounds are hypoactive. There is generalized tenderness.  Genitourinary:  Rectal exam shows external hemorrhoid. Rectal exam shows no fissure and no mass.  Musculoskeletal: She exhibits no edema.  Neurological: She is alert and oriented to person, place, and time. Gait normal.  Skin: Skin is warm and dry.    ASSESSMENT and PLAN:  1. Rectal bleeding Patient will be treated empirically for diverticulitis with cipro and mzd. However given h/o polyps and ongoing symptoms referring to GI for colonoscopy and further evaluation. - Ambulatory referral to Gastroenterology  2. Tubular adenoma of colon - Ambulatory referral to Gastroenterology  3. Environmental allergies Refilling as requested.  - fluticasone (FLONASE) 50 MCG/ACT nasal spray; Place 1 spray into both nostrils daily.  Dispense: 16 g; Refill: [place], MD Primary Care at [facility] [place], [place] [address] Ph.  [phone] Fax [phone]

## 2017-07-25 NOTE — Telephone Encounter (Signed)
Pt due for 6 mo labs.  Refilled x 30 days. Sent to scheduling to call and make appt.

## 2017-07-25 NOTE — Patient Instructions (Addendum)
     IF you received an x-ray today, you will receive an invoice from [facility]. Please contact [facility] at [phone] with questions or concerns regarding your invoice.   IF you received labwork today, you will receive an invoice from [facility]. Please contact [facility] at [phone] with questions or concerns regarding your invoice.   Our billing staff will not be able to assist you with questions regarding bills from these companies.  You will be contacted with the lab results as soon as they are available. The fastest way to get your results is to activate your My Chart account. Instructions are located on the last page of this paperwork. If you have not heard from [place] regarding the results in 2 weeks, please contact this office.     Diverticulitis Diverticulitis is when small pockets in your large intestine (colon) get infected or swollen. This causes stomach pain and watery poop (diarrhea). These pouches are called diverticula. They form in people who have a condition called diverticulosis. Follow these instructions at home: Medicines  Take over-the-counter and prescription medicines only as told by your doctor. These include: ? Antibiotics. ? Pain medicines. ? Fiber pills. ? Probiotics. ? Stool softeners.  Do not drive or use heavy machinery while taking prescription pain medicine.  If you were prescribed an antibiotic, take it as told. Do not stop taking it even if you feel better. General instructions  Follow a diet as told by your doctor.  When you feel better, your doctor may tell you to change your diet. You may need to eat a lot of fiber. Fiber makes it easier to poop (have bowel movements). Healthy foods with fiber include: ? Berries. ? Beans. ? Lentils. ? Green vegetables.  Exercise 3 or more times a week. Aim for 30 minutes each time. Exercise enough to sweat and make your heart beat faster.  Keep all follow-up visits as told. This is  important. You may need to have an exam of the large intestine. This is called a colonoscopy. Contact a doctor if:  Your pain does not get better.  You have a hard time eating or drinking.  You are not pooping like normal. Get help right away if:  Your pain gets worse.  Your problems do not get better.  Your problems get worse very fast.  You have a fever.  You throw up (vomit) more than one time.  You have poop that is: ? Bloody. ? Black. ? Tarry. Summary  Diverticulitis is when small pockets in your large intestine (colon) get infected or swollen.  Take medicines only as told by your doctor.  Follow a diet as told by your doctor. This information is not intended to replace advice given to you by your health care provider. Make sure you discuss any questions you have with your health care provider. Document Released: 04/30/2008 Document Revised: 11/29/2016 Document Reviewed: 11/29/2016 Elsevier Interactive Patient Education  2017 [employer].

## 2017-07-31 DIAGNOSIS — E119 Type 2 diabetes mellitus without complications: Secondary | ICD-10-CM | POA: Diagnosis not present

## 2017-08-02 ENCOUNTER — Emergency Department (HOSPITAL_COMMUNITY): Payer: Medicare Other

## 2017-08-02 ENCOUNTER — Encounter (HOSPITAL_COMMUNITY): Payer: Self-pay | Admitting: *Deleted

## 2017-08-02 ENCOUNTER — Emergency Department (HOSPITAL_COMMUNITY)
Admission: EM | Admit: 2017-08-02 | Discharge: 2017-08-02 | Disposition: A | Discharge status: Home / Self Care | Payer: Medicare Other | Attending: Emergency Medicine | Admitting: Emergency Medicine

## 2017-08-02 DIAGNOSIS — E785 Hyperlipidemia, unspecified: Secondary | ICD-10-CM | POA: Diagnosis not present

## 2017-08-02 DIAGNOSIS — K4090 Unilateral inguinal hernia, without obstruction or gangrene, not specified as recurrent: Secondary | ICD-10-CM | POA: Diagnosis not present

## 2017-08-02 DIAGNOSIS — Z7982 Long term (current) use of aspirin: Secondary | ICD-10-CM | POA: Diagnosis not present

## 2017-08-02 DIAGNOSIS — R1084 Generalized abdominal pain: Secondary | ICD-10-CM | POA: Diagnosis not present

## 2017-08-02 DIAGNOSIS — E039 Hypothyroidism, unspecified: Secondary | ICD-10-CM | POA: Insufficient documentation

## 2017-08-02 DIAGNOSIS — F17210 Nicotine dependence, cigarettes, uncomplicated: Secondary | ICD-10-CM | POA: Diagnosis not present

## 2017-08-02 DIAGNOSIS — R195 Other fecal abnormalities: Secondary | ICD-10-CM

## 2017-08-02 DIAGNOSIS — Z79899 Other long term (current) drug therapy: Secondary | ICD-10-CM | POA: Diagnosis not present

## 2017-08-02 DIAGNOSIS — K625 Hemorrhage of anus and rectum: Secondary | ICD-10-CM | POA: Insufficient documentation

## 2017-08-02 DIAGNOSIS — E119 Type 2 diabetes mellitus without complications: Secondary | ICD-10-CM | POA: Insufficient documentation

## 2017-08-02 DIAGNOSIS — I10 Essential (primary) hypertension: Secondary | ICD-10-CM | POA: Insufficient documentation

## 2017-08-02 DIAGNOSIS — K5730 Diverticulosis of large intestine without perforation or abscess without bleeding: Secondary | ICD-10-CM | POA: Diagnosis not present

## 2017-08-02 LAB — TYPE AND SCREEN
ABO/RH(D): O POS
Antibody Screen: NEGATIVE

## 2017-08-02 LAB — COMPREHENSIVE METABOLIC PANEL
ALK PHOS: 52 U/L (ref 38–126)
ALT: 26 U/L (ref 14–54)
AST: 31 U/L (ref 15–41)
Albumin: 3.8 g/dL (ref 3.5–5.0)
Anion gap: 4 — ABNORMAL LOW (ref 5–15)
BILIRUBIN TOTAL: 1 mg/dL (ref 0.3–1.2)
BUN: 21 mg/dL — AB (ref 6–20)
CO2: 27 mmol/L (ref 22–32)
Calcium: 9.4 mg/dL (ref 8.9–10.3)
Chloride: 112 mmol/L — ABNORMAL HIGH (ref 101–111)
Creatinine, Ser: 1.44 mg/dL — ABNORMAL HIGH (ref 0.44–1.00)
GFR calc Af Amer: 39 mL/min — ABNORMAL LOW (ref >60)
GFR, EST NON AFRICAN AMERICAN: 34 mL/min — AB (ref >60)
GLUCOSE: 122 mg/dL — AB (ref 65–99)
POTASSIUM: 4.6 mmol/L (ref 3.5–5.1)
Sodium: 143 mmol/L (ref 135–145)
TOTAL PROTEIN: 6.3 g/dL — AB (ref 6.5–8.1)

## 2017-08-02 LAB — URINALYSIS, ROUTINE W REFLEX MICROSCOPIC
Bilirubin Urine: NEGATIVE
GLUCOSE, UA: NEGATIVE mg/dL
Hgb urine dipstick: NEGATIVE
KETONES UR: NEGATIVE mg/dL
Nitrite: NEGATIVE
PROTEIN: NEGATIVE mg/dL
Specific Gravity, Urine: 1.032 — ABNORMAL HIGH (ref 1.005–1.030)
pH: 6 (ref 5.0–8.0)

## 2017-08-02 LAB — CBC
HCT: 38.5 % (ref 36.0–46.0)
Hemoglobin: 13 g/dL (ref 12.0–15.0)
MCH: 32.1 pg (ref 26.0–34.0)
MCHC: 33.8 g/dL (ref 30.0–36.0)
MCV: 95.1 fL (ref 78.0–100.0)
PLATELETS: 199 10*3/uL (ref 150–400)
RBC: 4.05 MIL/uL (ref 3.87–5.11)
RDW: 14.9 % (ref 11.5–15.5)
WBC: 5.8 10*3/uL (ref 4.0–10.5)

## 2017-08-02 LAB — CK: CK TOTAL: 102 U/L (ref 38–234)

## 2017-08-02 LAB — POC OCCULT BLOOD, ED: Fecal Occult Bld: NEGATIVE

## 2017-08-02 MED ORDER — IOPAMIDOL (ISOVUE-300) INJECTION 61%
INTRAVENOUS | Status: AC
  Administered 2017-08-02: 75 mL
  Filled 2017-08-02: qty 75

## 2017-08-02 MED ORDER — SODIUM CHLORIDE 0.9 % IV BOLUS (SEPSIS)
500.0000 mL | Freq: Once | INTRAVENOUS | Status: AC
  Administered 2017-08-02: 500 mL via INTRAVENOUS

## 2017-08-02 NOTE — ED Triage Notes (Signed)
Pt in c/o bright red blood large amts multiple times x 9 days ago, pt reports seeing PCP 07/25/17 who prescribed antiobiotics that she finished last night, pt reports bleeding stopped until this morning, pt reports dark tarry stools & bleeding again today from rectum, pt c/o heartburn, pt states, "My urine looks like tea & I have one kidney." pt A&O x4

## 2017-08-02 NOTE — Discharge Instructions (Addendum)
As discussed, Your tests were reassuring today. There was no blood detected in your stool and your CT scan did not show acute diverticulitis or other acute intraabdominal abnormal findings.   Follow up with gastroenterology and your primary care provider. Avoid red drinks or foods such as beets. Make sure you stay hydrated drinking enough water to keep your urine clear.  Return if symptoms worsen, abdominal pain, weakness, lightheaded, blood in your stool or toilet bowl or other new concerning symptoms in the meantime.

## 2017-08-02 NOTE — ED Notes (Signed)
Lab called stated CMP hemolyses and needs to be redrawn.

## 2017-08-02 NOTE — ED Provider Notes (Signed)
[facility] DEPT Provider Note   CSN: [id number] Arrival date & time: 08/02/17  1051     History   Chief Complaint Chief Complaint  Patient presents with  . Blood In Stools    HPI Sherri Burns is a 81 y.o. female presenting with black tarry stools and abdominal discomfort with bright red blood on the toilet paper since yesterday. She explains that she was seen about 9 days ago by her PCP and prescribed Flagyl and Cipro which she has completed, but noted tarry stools and dark urine. Patient is concerned because she only has one kidney.  HPI  Past Medical History:  Diagnosis Date  . Abnormal EKG 02/07/2016   Inferolateral T wave inversion and ST depression.  Marland Kitchen Anxiety   . Arthritis   . Cancer ([place]) 15 yrs ago   ho renal s/p nephrectomy  . Colon polyps 11/2008   tubular adenoma  . Diabetes mellitus   . Elevated serum creatinine   . Elevated TSH   . Esophageal problem    esophageal dilation  . GERD (gastroesophageal reflux disease)    + hpylori  . Headache   . Hyperlipidemia   . Hypertension   . Hypothyroidism   . Ischemic colitis ([place])   . Renal insufficiency     Patient Active Problem List   Diagnosis Date Noted  . Abnormal EKG 02/07/2016  . Acute cholecystitis   . Acute calculous cholecystitis   . Cholecystitis 12/29/2015  . Type 2 diabetes mellitus with diabetic polyneuropathy, without long-term current use of insulin ([place]) 09/23/2012  . Essential hypertension   . Hypothyroidism   . CKD (chronic kidney disease), stage III     Past Surgical History:  Procedure Laterality Date  . ABDOMINAL HYSTERECTOMY  1978   partial  . CHOLECYSTECTOMY N/A 04/04/2016   Procedure: LAPAROSCOPIC CHOLECYSTECTOMY;  Surgeon: Autumn Messing III, MD;  Location: [facility];  Service: General;  Laterality: N/A;  . COLONOSCOPY    . ESOPHAGEAL DILATION  2016  . HERNIA REPAIR    . LAPAROSCOPIC CHOLECYSTECTOMY  04/04/2016  . NEPHRECTOMY Right     OB History    No data available        Home Medications    Prior to Admission medications   Medication Sig Start Date End Date Taking? Authorizing Provider  acetaminophen (TYLENOL) 500 MG tablet Take 500 mg by mouth every 6 (six) hours as needed for mild pain.   Yes [provider]  aspirin EC 81 MG tablet Take 81 mg by mouth daily.   Yes [provider]  cholecalciferol (VITAMIN D) 1000 units tablet Take 1,000 Units by mouth daily.   Yes [provider]  clonazePAM (KLONOPIN) 1 MG tablet TAKE HALF TABLET DAILY IN THE MORNING AND 1/2 TABLET AT NOON TIME THEN TAKE ONE TABLET AT BEDTIME DAILY Patient taking differently: Take 0.5-1 mg by mouth See admin instructions. TAKE 0.5MG  DAILY IN THE MORNING, 0.5MG  AT NOON TIME THEN TAKE 1MG  AT BEDTIME DAILY 03/07/17  Yes Shawnee Knapp, MD  diltiazem (CARDIZEM CD) 360 MG 24 hr capsule Take 1 capsule (360 mg total) by mouth daily. 03/07/17  Yes Shawnee Knapp, MD  fluticasone (FLONASE) 50 MCG/ACT nasal spray Place 1 spray into both nostrils daily. 07/25/17  Yes Rutherford Guys, MD  furosemide (LASIX) 40 MG tablet Take 1 tablet (40 mg total) by mouth daily. 02/23/17  Yes Shawnee Knapp, MD  glipiZIDE (GLUCOTROL) 10 MG tablet TAKE TWO TABLETS (20 MG TOTAL)  BY MOUTH TWO (TWO) TIMES DAILY BEFORE A MEAL. [place] 06/24/17  Yes Shawnee Knapp, MD  levothyroxine (SYNTHROID, LEVOTHROID) 75 MCG tablet TAKE 1 TABLET BY MOUTH DAILY BEFORE BREAKFAST 01/29/17  Yes Jeffery, Chelle, PA-C  losartan (COZAAR) 25 MG tablet TAKE 1 TABLET (25 MG TOTAL) BY MOUTH DAILY. 07/25/17  Yes Shawnee Knapp, MD  metoprolol tartrate (LOPRESSOR) 100 MG tablet Take 1 tablet (100 mg total) by mouth 2 (two) times daily. 07/25/17  Yes Shawnee Knapp, MD  omeprazole (PRILOSEC) 40 MG capsule TAKE 1 CAPSULE BY MOUTH DAILY 06/24/17  Yes Shawnee Knapp, MD  polyethylene glycol (MIRALAX / GLYCOLAX) packet Take 8.5 g by mouth every morning.    Yes [provider]  vitamin E 100 UNIT capsule Take 100 Units by mouth daily.    Yes [provider]  ciprofloxacin (CIPRO) 500 MG tablet Take 1 tablet (500 mg total) by mouth 2 (two) times daily. Patient not taking: Reported on 08/02/2017 07/25/17   Rutherford Guys, MD  metroNIDAZOLE (FLAGYL) 500 MG tablet Take 1 tablet (500 mg total) by mouth 3 (three) times daily. Patient not taking: Reported on 08/02/2017 07/25/17   Rutherford Guys, MD  ondansetron (ZOFRAN) 4 MG tablet Take 1 tablet (4 mg total) by mouth every 6 (six) hours. Patient not taking: Reported on 07/25/2017 01/29/17   Harrison Mons, PA-C    Family History Family History  Problem Relation Age of Onset  . Cancer Mother        colon, kidney cancer  . Cancer Father        throat cancer  . Cancer Sister   . Heart attack Brother     Social History Social History  Substance Use Topics  . Smoking status: Current Some Day Smoker    Packs/day: 0.25    Years: 55.00    Types: Cigarettes  . Smokeless tobacco: Never Used     Comment: cut back amount still  trying to quit  . Alcohol use No     Allergies   Codeine; Ciprocin-fluocin-procin [fluocinolone acetonide]; Clonidine derivatives; Crestor [rosuvastatin calcium]; Penicillins; Simvastatin; and Tessalon perles   Review of Systems Review of Systems  Constitutional: Negative for chills and fever.  Respiratory: Negative for chest tightness, shortness of breath and wheezing.   Cardiovascular: Negative for chest pain.  Gastrointestinal: Positive for abdominal pain and anal bleeding. Negative for diarrhea, nausea, rectal pain and vomiting.  Genitourinary: Negative for difficulty urinating, dysuria, flank pain, frequency and hematuria.       Dark urine  Musculoskeletal: Negative for myalgias, neck pain and neck stiffness.  Skin: Negative for color change, pallor and rash.  Neurological: Negative for dizziness, tremors, syncope, weakness and light-headedness.      Physical Exam Updated Vital Signs BP (!) 158/80   Pulse (!) 59   Temp 98 F  (36.7 C) (Oral)   Resp 16   SpO2 100%   Physical Exam  Constitutional: She appears well-developed and well-nourished. No distress.  Afebrile, nontoxic-appearing, lying comfortably in bed in no acute distress.  HENT:  Head: Normocephalic and atraumatic.  Eyes: Conjunctivae are normal.  Neck: Normal range of motion. Neck supple.  Cardiovascular: Normal rate, regular rhythm, normal heart sounds and intact distal pulses.   No murmur heard. Pulmonary/Chest: Effort normal and breath sounds normal. No respiratory distress. She has no wheezes. She has no rales.  Abdominal: Soft. She exhibits no distension. There is tenderness.  Discomfort with palpation throughout but worse  in the left lower quadrant.  Genitourinary: Rectal exam shows guaiac negative stool.  Genitourinary Comments: No anal fissure or hemorrhoids noted  Musculoskeletal: Normal range of motion. She exhibits no edema or deformity.  Neurological: She is alert.  Skin: Skin is warm and dry. Capillary refill takes less than 2 seconds. No rash noted. She is not diaphoretic. No erythema. No pallor.  Psychiatric: She has a normal mood and affect.  Nursing note and vitals reviewed.    ED Treatments / Results  Labs (all labs ordered are listed, but only abnormal results are displayed) Labs Reviewed  COMPREHENSIVE METABOLIC PANEL - Abnormal; Notable for the following:       Result Value   Chloride 112 (*)    Glucose, Bld 122 (*)    BUN 21 (*)    Creatinine, Ser 1.44 (*)    Total Protein 6.3 (*)    GFR calc non Af Amer 34 (*)    GFR calc Af Amer 39 (*)    Anion gap 4 (*)    All other components within normal limits  URINALYSIS, ROUTINE W REFLEX MICROSCOPIC - Abnormal; Notable for the following:    Specific Gravity, Urine 1.032 (*)    Leukocytes, UA TRACE (*)    Bacteria, UA RARE (*)    Squamous Epithelial / LPF 0-5 (*)    All other components within normal limits  URINE CULTURE  CBC  CK  POC OCCULT BLOOD, ED  TYPE AND  SCREEN    EKG  EKG Interpretation None       Radiology Ct Abdomen Pelvis W Contrast  Result Date: 08/02/2017 CLINICAL DATA:  Ct a/p 62ml iso 300, pos ob stool, Abd pain, diverticulitis suspected, Gallbladder surgery 03/2017, rt kidney surgery 1988, hx of ca^72mL ISOVUE-300 IOPAMIDOL (ISOVUE-300) INJECTION 61% EXAM: CT ABDOMEN AND PELVIS WITH CONTRAST TECHNIQUE: Multidetector CT imaging of the abdomen and pelvis was performed using the standard protocol following bolus administration of intravenous contrast. CONTRAST:  79mL ISOVUE-300 IOPAMIDOL (ISOVUE-300) INJECTION 61% COMPARISON:  09/01/2016 FINDINGS: Lower chest: No acute abnormality. Hepatobiliary: Status post cholecystectomy.  Liver is homogeneous. Pancreas: Unremarkable. No pancreatic ductal dilatation or surrounding inflammatory changes. Spleen: Normal in size without focal abnormality. Adrenals/Urinary Tract: Status post right nephrectomy. The left adrenal gland and kidney are normal in appearance. Urinary bladder is normal in appearance. Stomach/Bowel: The stomach and small bowel loops are normal in appearance. The appendix is well seen and has a normal appearance. Redundant colon. Numerous colonic diverticula without acute diverticulitis. Vascular/Lymphatic: There is atherosclerotic calcification of the abdominal aorta not associated with aneurysm. Reproductive: Status post hysterectomy.  No adnexal mass. Other: No free pelvic fluid. Small fat containing bilateral inguinal hernias, left greater than right. Musculoskeletal: No acute or significant osseous findings. IMPRESSION: 1. Colonic diverticulosis without acute diverticulitis. 2. Status post cholecystectomy. 3. Status post right nephrectomy. 4. Status post hysterectomy. 5. Small bilateral fat containing inguinal hernias, left greater than right. Electronically Signed   By: Nolon Nations M.D.   On: 08/02/2017 14:31    Procedures Procedures (including critical care time)  Medications  Ordered in ED Medications  iopamidol (ISOVUE-300) 61 % injection (75 mLs  Contrast Given 08/02/17 1356)  sodium chloride 0.9 % bolus 500 mL (0 mLs Intravenous Stopped 08/02/17 1627)     Initial Impression / Assessment and Plan / ED Course  I have reviewed the triage vital signs and the nursing notes.  Pertinent labs & imaging results that were available during my care of the  patient were reviewed by me and considered in my medical decision making (see chart for details).     Patient presenting after completion of her antibiotic regimen for diverticulitis with complaints of tarry stools and bright red blood on tissue paper. No fissure or hemorrhoids noted Hemoccult negative Labs are unremarkable   CT abdomen with known diverticulosis without acute diverticulitis or other acute finding.  Creatinine mildly elevated from prior Patient received gentle hydration and improved while in the emergency department.  Advised hydration and close follow-up with PCP.  Discharge home with follow-up with gastroenterology.  Patient was discussed with Dr. Roderic Palau who agrees with assessment and plan. Discussed strict return precautions and advised to return to the emergency department if experiencing any new or worsening symptoms. Instructions were understood and patient agreed with discharge plan. Final Clinical Impressions(s) / ED Diagnoses   Final diagnoses:  Dark stools  Generalized abdominal pain    New Prescriptions Discharge Medication List as of 08/02/2017  4:24 PM       Emeline General, PA-C 08/02/17 2039    Milton Ferguson, MD 08/05/17 1615

## 2017-08-03 LAB — URINE CULTURE

## 2017-08-22 ENCOUNTER — Other Ambulatory Visit: Payer: Self-pay | Admitting: Family Medicine

## 2017-08-24 NOTE — Telephone Encounter (Signed)
Needs OV in 1 mo for any further refills on her klonopin

## 2017-09-19 ENCOUNTER — Other Ambulatory Visit: Payer: Self-pay | Admitting: Family Medicine

## 2017-09-24 ENCOUNTER — Encounter: Payer: Self-pay | Admitting: Family Medicine

## 2017-09-24 ENCOUNTER — Other Ambulatory Visit: Payer: Self-pay | Admitting: Physician Assistant

## 2017-09-24 DIAGNOSIS — E785 Hyperlipidemia, unspecified: Secondary | ICD-10-CM | POA: Insufficient documentation

## 2017-09-24 DIAGNOSIS — E038 Other specified hypothyroidism: Secondary | ICD-10-CM

## 2017-09-24 NOTE — Progress Notes (Signed)
Subjective:    Patient ID: Sherri Burns, female    DOB: 1936/09/20, 81 y.o.   MRN: [id number] Chief Complaint  Patient presents with  . Medication Refill    TIIDM, levothyroxine, diltiazem, metoprolol, clonazepam    HPI Sherri Burns is a 81 y.o. female who presents to Primary Care at [facility] for 7 mo follow up on multiple chronic medical issues such as DM.  Still very distressed about trying to pack, clean out, move, and sell her house. Staying with her daughter. She had a death in her family 2 months ago and again sev wks ago.  DMII: Diagnosed .   Lab Results  Component Value Date   HGBA1C CANCELED 02/23/2017   HGBA1C 7.9 02/23/2017   HGBA1C 7.3 10/02/2016   CBGs: pt is not checking her sugars. No hypoglycemic episodes.  Meter type: does not have a meter - she has a needle phobia - especially around pricking herself much worse - she can deal if a doctor does it.  Diet: avoids whole grain due to h/o cholecystectomy and recurrent diverticulitis. Avoids sweets but occasionally gives in - worse now with stress. Exercising: walking and going up stair while staying with her daughter  DM Med Regimen: glipizide 20 bid Prior changes:  started Metformin 04/2015  Increased glipizide XR 10 bid to IR 20 bid 01/2017  eGFR: 39-49 Baseline Cr: 1.2-1.45 Last checked 08/02/17. Microalb: Normal 09/2016. On arb Lipids:  LDL 131,  non-HDL 207.  Last levels done 7 mos prior - uncontrolled. Not on statin due to intolerance of crestor and zocor. She did eat 3 saltine crackers today. Taking asa 81 qd.  Optho: Seen by opto on [place] - last exam. She is getting ready to have her cataracts removed. Feet: Monofilament exam done today. Does have neuropathy sxs.  Not seen by podiatry prior.  Immunizations:  Influenza: annual, done today  Pneumovax-23: done 2006, 2017  HTN: On diltiazem cd 360 qd, losartan 25, metoprolol 100 bid, lasix 40 daily. She was previously taking potassium supplement, but  hasn't been taking it for 2 months. She denies checking her BP outside the office.   HLD: Uncontrolled. Not on statin as Crestor -> cramps and simvastatin -> upset stomach. She thinks she has tried about 4 different chol meds prior and all caused her till feel poorly.  Hypothyroid: levothyroxine 75 - tsh has been nml for almost 5 yrs.  Recurrent rectal bleeding and melena: H/o diverticulitis - was trx'd for this w/ cipro/flagyl 2 mos ago. Seen in ED 1 wk later w/ continued sxs but CT scan w/o acute findings. Has been advised by multiple providers to f/u w/ GI - likely needs endoscopy - pt of Dr. Earlean Shawl at [facility] but they were unable to contact her - no VM set up.   Cbc was nml.  Sxs are now resolved.  Avoids constipation with miralax mixed into coffee qam. On prilosec 40 qam.  Chronic left arm and back pain from prior wear and tear. Prn tylenol.   Anxiety: clonazepam 0.58m twice during day and 158mqhs.  Seasonal allergies: flonase  She has noticed a firmness in her LLQ for 1-2 months that is reminiscent of when she had Rt renal cancer. Her bowels have been great. Using miralax and drinking aloe vera juice.  Past Medical History:  Diagnosis Date  . Abnormal EKG 02/07/2016   Inferolateral T wave inversion and ST depression.  . Marland Kitchennxiety   . Arthritis   .  Cancer ([place]) 15 yrs ago   ho renal s/p nephrectomy  . Colon polyps 11/2008   tubular adenoma  . Diabetes mellitus   . Elevated serum creatinine   . Elevated TSH   . Esophageal problem    esophageal dilation  . GERD (gastroesophageal reflux disease)    + hpylori  . Headache   . Hyperlipidemia   . Hypertension   . Hypothyroidism   . Ischemic colitis ([place])   . Renal insufficiency    Prior to Admission medications   Medication Sig Start Date End Date Taking? Authorizing Provider  aspirin EC 81 MG tablet Take 81 mg by mouth daily.    Historical Provider, MD  cholecalciferol (VITAMIN D) 1000 units tablet  Take 1,000 Units by mouth daily.    Historical Provider, MD  clonazePAM (KLONOPIN) 1 MG tablet TAKE HALF TABLET DAILY IN THE MORNING AND 1/2 TABLET AT NOON TIME THEN TAKE ONE TABLET AT BEDTIME DAILY 12/14/16   Shawnee Knapp, MD  diltiazem (CARDIZEM CD) 240 MG 24 hr capsule TAKE 1 CAPSULE BY MOUTH DAILY 12/09/16   Shawnee Knapp, MD  fluticasone (FLONASE) 50 MCG/ACT nasal spray Place 2 sprays into both nostrils daily. Patient taking differently: Place 2 sprays into both nostrils daily as needed for allergies.  01/04/16   [name], MD  furosemide (LASIX) 40 MG tablet Take 1 tablet (40 mg total) by mouth daily. 01/04/16   [name], MD  glipiZIDE (GLUCOTROL XL) 10 MG 24 hr tablet Take 1 tablet (10 mg total) by mouth 2 (two) times daily. 01/29/17   Chelle Jeffery, PA-C  levothyroxine (SYNTHROID, LEVOTHROID) 75 MCG tablet TAKE 1 TABLET BY MOUTH DAILY BEFORE BREAKFAST 01/29/17   Chelle Jeffery, PA-C  losartan (COZAAR) 25 MG tablet Take 1 tablet (25 mg total) by mouth daily. 10/12/16   Shawnee Knapp, MD  metoprolol (LOPRESSOR) 100 MG tablet Take 1 tablet (100 mg total) by mouth 2 (two) times daily. 01/29/17   Chelle Jeffery, PA-C  omeprazole (PRILOSEC) 40 MG capsule TAKE 1 CAPSULE BY MOUTH DAILY 01/29/17   Chelle Jeffery, PA-C  ondansetron (ZOFRAN) 4 MG tablet Take 1 tablet (4 mg total) by mouth every 6 (six) hours. 01/29/17   Chelle Jeffery, PA-C  polyethylene glycol (MIRALAX / GLYCOLAX) packet Take 8.5 g by mouth every morning.     Historical Provider, MD  vitamin E 100 UNIT capsule Take 100 Units by mouth daily.    Historical Provider, MD   Allergies  Allergen Reactions  . Codeine Other (See Comments)    hallucinations  . Ciprocin-Fluocin-Procin [Fluocinolone Acetonide] Other (See Comments)    Unknown reaction  . Clonidine Derivatives Other (See Comments)    Dry mouth  . Crestor [Rosuvastatin Calcium] Other (See Comments)    cramps  . Penicillins Other (See Comments)    Has patient had a PCN reaction causing  immediate rash, facial/tongue/throat swelling, SOB or lightheadedness with hypotension: no Has patient had a PCN reaction causing severe rash involving mucus membranes or skin necrosis: no Has patient had a PCN reaction that required hospitalization : no Has patient had a PCN reaction occurring within the last 10 years: no -Hallucinates If all of the above answers are "NO", then may proceed with Cephalosporin use.   . Simvastatin Other (See Comments)    Upset stomach   . Tessalon Perles Other (See Comments)    Gi upset    Past Surgical History:  Procedure Laterality Date  . ABDOMINAL HYSTERECTOMY  1978   partial  . CHOLECYSTECTOMY N/A 04/04/2016   Procedure: LAPAROSCOPIC CHOLECYSTECTOMY;  Surgeon: Autumn Messing III, MD;  Location: [facility];  Service: General;  Laterality: N/A;  . COLONOSCOPY    . ESOPHAGEAL DILATION  2016  . HERNIA REPAIR    . LAPAROSCOPIC CHOLECYSTECTOMY  04/04/2016  . NEPHRECTOMY Right 2010   Dr. Rosana Hoes, urology, due to renal cancer   Family History  Problem Relation Age of Onset  . Cancer Mother        colon, kidney cancer  . Cancer Father        throat cancer  . Cancer Sister   . Heart attack Brother    Social History   Social History  . Marital status: Widowed    Spouse name: N/A  . Number of children: N/A  . Years of education: N/A   Social History Main Topics  . Smoking status: Current Some Day Smoker    Packs/day: 0.25    Years: 55.00    Types: Cigarettes  . Smokeless tobacco: Never Used     Comment: cut back amount still  trying to quit  . Alcohol use No  . Drug use: No  . Sexual activity: Yes   Other Topics Concern  . None   Social History Narrative   Loss of son.   Depression screen [facility] 2/9 09/26/2017 07/25/2017 03/07/2017 02/23/2017 10/02/2016  Decreased Interest 0 3 2 0 0  Down, Depressed, Hopeless 0 _0 PHQ - 2 Score 0 _1 Altered sleeping - 0 2 3 0  Tired, decreased energy - _2 Change in appetite - 1 0 1 3  Feeling  bad or failure about yourself  - 0 1 0 0  Trouble concentrating - 0 0 0 0  Moving slowly or fidgety/restless - 0 0 0 0  Suicidal thoughts - 0 0 0 0  PHQ-9 Score - _3 Difficult doing work/chores - - Not difficult at all Not difficult at all Not difficult at all  Some recent data might be hidden    Review of Systems  Constitutional: Positive for fatigue. Negative for chills, fever and unexpected weight change.  HENT: Positive for rhinorrhea.   Respiratory: Negative for chest tightness and shortness of breath.   Cardiovascular: Negative for chest pain, palpitations and leg swelling.  Gastrointestinal: Positive for abdominal pain. Negative for blood in stool.  Musculoskeletal: Positive for back pain and myalgias.  Allergic/Immunologic: Positive for environmental allergies.  Neurological: Positive for headaches. Negative for dizziness, syncope and light-headedness.       Objective:   Physical Exam  Constitutional: She is oriented to person, place, and time. She appears well-developed and well-nourished. No distress.  HENT:  Head: Normocephalic and atraumatic.  Right Ear: Tympanic membrane is retracted.  Left Ear: Tympanic membrane is retracted.  Nares with erythema  Eyes: Pupils are equal, round, and reactive to light. EOM are normal.  Neck: Neck supple.  Cardiovascular: Normal rate, regular rhythm, S1 normal, S2 normal and normal heart sounds.   No murmur heard. Pulmonary/Chest: Effort normal and breath sounds normal. No respiratory distress. She has no wheezes.  Musculoskeletal: Normal range of motion.  Neurological: She is alert and oriented to person, place, and time.  Skin: Skin is warm and dry.  Psychiatric: She has a normal mood and affect. Her behavior is normal.  Nursing note and vitals reviewed.   Pulse 74   Temp  98.1 F (36.7 C)   Resp 16   Ht 5' 4.75" (1.645 m)   Wt 173 lb 9.6 oz (78.7 kg)   SpO2 96%   BMI 29.11 kg/m    Diabetic Foot Exam - Simple     Simple Foot Form Diabetic Foot exam was performed with the following findings:  Yes 09/26/2017 11:13 AM  Visual Inspection No deformities, no ulcerations, no other skin breakdown bilaterally:  Yes Sensation Testing Intact to touch and monofilament testing bilaterally:  Yes Pulse Check Posterior Tibialis and Dorsalis pulse intact bilaterally:  Yes Comments     Results for orders placed or performed in visit on 09/26/17  Comprehensive metabolic panel  Result Value Ref Range   Glucose 143 (H) 65 - 99 mg/dL   BUN 15 8 - 27 mg/dL   Creatinine, Ser 3.63 (H) 0.57 - 1.00 mg/dL   GFR calc non Af Amer 37 (L) >59 mL/min/1.73   GFR calc Af Amer 43 (L) >59 mL/min/1.73   BUN/Creatinine Ratio 11 (L) 12 - 28   Sodium 147 (H) 134 - 144 mmol/L   Potassium 4.4 3.5 - 5.2 mmol/L   Chloride 105 96 - 106 mmol/L   CO2 25 20 - 29 mmol/L   Calcium 9.5 8.7 - 10.3 mg/dL   Total Protein 7.3 6.0 - 8.5 g/dL   Albumin 4.5 3.5 - 4.8 g/dL   Globulin, Total 2.8 1.5 - 4.5 g/dL   Albumin/Globulin Ratio 1.6 1.2 - 2.2   Bilirubin Total 0.4 0.0 - 1.2 mg/dL   Alkaline Phosphatase 59 39 - 117 IU/L   AST 16 0 - 40 IU/L   ALT 15 0 - 32 IU/L  TSH  Result Value Ref Range   TSH 5.330 (H) 0.450 - 4.500 uIU/mL  Microalbumin/Creatinine Ratio, Urine  Result Value Ref Range   Creatinine, Urine 278.6 Not Estab. mg/dL   Albumin, Urine 71.8 Not Estab. ug/mL   Microalb/Creat Ratio 9.2 0.0 - 30.0 mg/g creat  CBC with Differential/Platelet  Result Value Ref Range   WBC 7.0 3.4 - 10.8 x10E3/uL   RBC 3.87 3.77 - 5.28 x10E6/uL   Hemoglobin 12.2 11.1 - 15.9 g/dL   Hematocrit 27.8 43.6 - 46.6 %   MCV 96 79 - 97 fL   MCH 31.5 26.6 - 33.0 pg   MCHC 32.9 31.5 - 35.7 g/dL   RDW 43.5 46.7 - 32.2 %   Platelets 210 150 - 379 x10E3/uL   Neutrophils 56 Not Estab. %   Lymphs 34 Not Estab. %   Monocytes 6 Not Estab. %   Eos 3 Not Estab. %   Basos 1 Not Estab. %   Neutrophils Absolute 3.9 1.4 - 7.0 x10E3/uL   Lymphocytes Absolute  2.4 0.7 - 3.1 x10E3/uL   Monocytes Absolute 0.4 0.1 - 0.9 x10E3/uL   EOS (ABSOLUTE) 0.2 0.0 - 0.4 x10E3/uL   Basophils Absolute 0.0 0.0 - 0.2 x10E3/uL   Immature Granulocytes 0 Not Estab. %   Immature Grans (Abs) 0.0 0.0 - 0.1 x10E3/uL  POCT glycosylated hemoglobin (Hb A1C)  Result Value Ref Range   Hemoglobin A1C 8.0        Assessment & Plan:  A1c, microalb, cmp, tsh, cbc Foot exam  Consider pravastatin, pitavastatin, or zetia Flu shot  Sched AWV and CPE Refer for dexa scan  1. Type 2 diabetes mellitus with diabetic polyneuropathy, without long-term current use of insulin (HCC) - will have pt restart lower dose metformin. Maxed out on glipizide and a1c  getting worse despite dose increases. Can't afford newer medicines, very reluctant to start insulin. CKD with eGFR 40-45 but with low dose the risk of adverse outcome for this is much lower than with uncontrolled DM, tol metformin well prior. Hopefully after pt gets her house cleaned/sold which is a huge stress for her, she will be able to work on tlc again and come off the metformin - if not, consider changing metformin to dpp4. . . .  2. Hypothyroidism, unspecified type -  tsh mildly elevated today - will increase levothyroxine from 75 to 88 mcg. Recheck in 3 months  3. Essential hypertension   4. CKD (chronic kidney disease), stage III ([place])   5. Rectal bleeding   6. Mixed hyperlipidemia   7. Need for prophylactic vaccination and inoculation against influenza   8. Essential hypertension, benign   9. Generalized edema   10. Other specified hypothyroidism     Orders Placed This Encounter  Procedures  . Flu Vaccine QUAD 36+ mos IM  . Comprehensive metabolic panel  . TSH  . Microalbumin/Creatinine Ratio, Urine  . CBC with Differential/Platelet  . POCT glycosylated hemoglobin (Hb A1C)  . HM DIABETES FOOT EXAM    Meds ordered this encounter  Medications  . clonazePAM (KLONOPIN) 1 MG tablet    Sig: 1/2 tab po qam, 1/2 tab  po qlunch, and 1 tab po qhs.    Dispense:  60 tablet    Refill:  3  . diltiazem (CARDIZEM CD) 360 MG 24 hr capsule    Sig: Take 1 capsule (360 mg total) by mouth daily.    Dispense:  90 capsule    Refill:  1  . furosemide (LASIX) 40 MG tablet    Sig: Take 1 tablet (40 mg total) by mouth daily.    Dispense:  90 tablet    Refill:  1  . glipiZIDE (GLUCOTROL) 10 MG tablet    Sig: TAKE TWO TABLETS (20 MG TOTAL) BY MOUTH TWO (TWO) TIMES DAILY BEFORE A MEAL. 30 MINUTES PRIOR    Dispense:  360 tablet    Refill:  1  . DISCONTD: levothyroxine (SYNTHROID, LEVOTHROID) 75 MCG tablet    Sig: TAKE 1 TABLET BY MOUTH DAILY BEFORE BREAKFAST    Dispense:  90 tablet    Refill:  1  . losartan (COZAAR) 25 MG tablet    Sig: Take 1 tablet (25 mg total) by mouth daily.    Dispense:  90 tablet    Refill:  1  . metoprolol tartrate (LOPRESSOR) 100 MG tablet    Sig: Take 1 tablet (100 mg total) by mouth 2 (two) times daily.    Dispense:  180 tablet    Refill:  1  . omeprazole (PRILOSEC) 40 MG capsule    Sig: Take 1 capsule (40 mg total) by mouth daily.    Dispense:  90 capsule    Refill:  1  . metFORMIN (GLUCOPHAGE) 500 MG tablet    Sig: Take 1 tablet (500 mg total) by mouth 2 (two) times daily with a meal.    Dispense:  180 tablet    Refill:  1  . levothyroxine (SYNTHROID, LEVOTHROID) 88 MCG tablet    Sig: Take 1 tablet (88 mcg total) by mouth daily before breakfast.    Dispense:  90 tablet    Refill:  1    D/C all prior rxs for levothyroxine - esp d/c  rx for levothyroxine 75 I sent yest. thanks     Harmon Pier  Brigitte Pulse, M.D.  Primary Care at [facility] [address] [place], [place] [address] [phone] phone [phone] fax  09/27/17 10:43 AM

## 2017-09-26 ENCOUNTER — Ambulatory Visit (INDEPENDENT_AMBULATORY_CARE_PROVIDER_SITE_OTHER): Payer: Medicare Other | Admitting: Family Medicine

## 2017-09-26 ENCOUNTER — Encounter: Payer: Self-pay | Admitting: Family Medicine

## 2017-09-26 VITALS — HR 74 | Temp 98.1°F | Resp 16 | Ht 64.75 in | Wt 173.6 lb

## 2017-09-26 DIAGNOSIS — E1142 Type 2 diabetes mellitus with diabetic polyneuropathy: Secondary | ICD-10-CM

## 2017-09-26 DIAGNOSIS — E039 Hypothyroidism, unspecified: Secondary | ICD-10-CM

## 2017-09-26 DIAGNOSIS — E782 Mixed hyperlipidemia: Secondary | ICD-10-CM | POA: Diagnosis not present

## 2017-09-26 DIAGNOSIS — E038 Other specified hypothyroidism: Secondary | ICD-10-CM | POA: Diagnosis not present

## 2017-09-26 DIAGNOSIS — Z23 Encounter for immunization: Secondary | ICD-10-CM

## 2017-09-26 DIAGNOSIS — N183 Chronic kidney disease, stage 3 unspecified: Secondary | ICD-10-CM

## 2017-09-26 DIAGNOSIS — R601 Generalized edema: Secondary | ICD-10-CM

## 2017-09-26 DIAGNOSIS — I10 Essential (primary) hypertension: Secondary | ICD-10-CM | POA: Diagnosis not present

## 2017-09-26 DIAGNOSIS — K625 Hemorrhage of anus and rectum: Secondary | ICD-10-CM

## 2017-09-26 LAB — POCT GLYCOSYLATED HEMOGLOBIN (HGB A1C): Hemoglobin A1C: 8

## 2017-09-26 MED ORDER — LOSARTAN POTASSIUM 25 MG PO TABS: 25.0000 mg | ORAL_TABLET | Freq: Every day | ORAL | 1 refills | Status: DC

## 2017-09-26 MED ORDER — OMEPRAZOLE 40 MG PO CPDR: 40.0000 mg | DELAYED_RELEASE_CAPSULE | Freq: Every day | ORAL | 1 refills | Status: DC

## 2017-09-26 MED ORDER — METOPROLOL TARTRATE 100 MG PO TABS: 100.0000 mg | ORAL_TABLET | Freq: Two times a day (BID) | ORAL | 1 refills | Status: DC

## 2017-09-26 MED ORDER — GLIPIZIDE 10 MG PO TABS: ORAL_TABLET | ORAL | 1 refills | Status: DC

## 2017-09-26 MED ORDER — CLONAZEPAM 1 MG PO TABS: ORAL_TABLET | ORAL | 3 refills | Status: DC

## 2017-09-26 MED ORDER — LEVOTHYROXINE SODIUM 75 MCG PO TABS: ORAL_TABLET | ORAL | 1 refills | Status: DC

## 2017-09-26 MED ORDER — METFORMIN HCL 500 MG PO TABS: 500.0000 mg | ORAL_TABLET | Freq: Two times a day (BID) | ORAL | 1 refills | Status: DC

## 2017-09-26 MED ORDER — FUROSEMIDE 40 MG PO TABS: 40.0000 mg | ORAL_TABLET | Freq: Every day | ORAL | 1 refills | Status: DC

## 2017-09-26 MED ORDER — DILTIAZEM HCL ER COATED BEADS 360 MG PO CP24: 240.0000 mg | ORAL_CAPSULE | Freq: Every day | ORAL | 1 refills | Status: DC

## 2017-09-26 NOTE — Patient Instructions (Addendum)
Dr. Earlean Shawl would like to see you about your recent rectal bleeding.  Please call his office to schedule your appointment since they have been unable to reach you. Dr. Mayme Genta, MD [place]   [phone]    IF you received an x-ray today, you will receive an invoice from [facility]. Please contact [facility] at [phone] with questions or concerns regarding your invoice.   IF you received labwork today, you will receive an invoice from [facility]. Please contact [facility] at [phone] with questions or concerns regarding your invoice.   Our billing staff will not be able to assist you with questions regarding bills from these companies.  You will be contacted with the lab results as soon as they are available. The fastest way to get your results is to activate your My Chart account. Instructions are located on the last page of this paperwork. If you have not heard from [place] regarding the results in 2 weeks, please contact this office.      Adjustment Disorder, Adult Adjustment disorder is a group of symptoms that can develop after a stressful life event, such as the loss of a job or serious physical illness. The symptoms can affect how you feel, think, and act. They may interfere with your relationships. Adjustment disorder increases your risk of suicide and substance abuse. If this disorder is not managed early, it can develop into a more serious condition, such as major depressive disorder or post-traumatic stress disorder. What are the causes? This condition happens when you have trouble recovering from or coping with a stressful life event. What increases the risk? You are more likely to develop this condition if:  You have had depression or anxiety.  You are being treated for a long-term (chronic) illness.  You are being treated for an illness that cannot be cured (terminal illness).  You have a family history of  mental illness.  What are the signs or symptoms? Symptoms of this condition include:  Extreme trouble doing daily tasks, such as going to work.  Sadness, depression, or crying spells.  Worrying a lot.  Loss of enjoyment.  Change in appetite or weight.  Feelings of loss or hopelessness.  Thoughts of suicide.  Anxiety, worry, or nervousness.  Trouble sleeping.  Avoiding family and friends.  Fighting or vandalism.  Complaining of feeling sick without being ill.  Feeling dazed or disconnected.  Nightmares.  Trouble sleeping.  Irritability.  Reckless driving.  Poor work Systems analyst.  Ignoring bills.  Symptoms of this condition start within three months of the stressful event. They do not last more than six months, unless the stressful circumstances last longer. Normal grieving after the death of a loved one is not a symptom of this condition. How is this diagnosed? To diagnose this condition, your health care provider will ask about what has happened in your life and how it has affected you. He or she may also ask about your medical history and your use of medicines, alcohol, and other substances. Your health care provider may do a physical exam and order lab tests or other studies. You may be referred to a mental health specialist. How is this treated? Treatment options for this condition include:  Counseling or talk therapy. Talk therapy is usually provided by mental health specialists.  Medicines. Certain medicines may help with depression, anxiety, and sleep.  Support groups. These offer emotional support, advice, and guidance. They are made up of  people who have had similar experiences.  Observation and time. This is sometimes called "watchful waiting." In this treatment, health care providers monitor your health and behavior without other treatment. Adjustment disorder sometimes gets better on its own with time.  Follow these instructions at home:  Take  over-the-counter and prescription medicines only as told by your health care provider.  Keep all follow-up visits as told by your health care provider. This is important. Contact a health care provider if:  Your symptoms do not improve in six months.  Your symptoms get worse. Get help right away if:  You have serious thoughts about hurting yourself or someone else. If you ever feel like you may hurt yourself or others, or have thoughts about taking your own life, get help right away. You can go to your nearest emergency department or call:  Your local emergency services (911 in the [place]  A suicide crisis helpline, such as the [facility] at 754-200-8678. This is open 24 hours a day.  Summary  Adjustment disorder is a group of symptoms that can develop after a stressful life event, such as the loss of a job or serious physical illness. The symptoms can affect how you feel, think, and act. They may interfere with your relationships.  Symptoms of this condition start within three months of the stressful event. They do not last more than six months, unless the stressful circumstances last longer.  Treatment may include talk therapy, medicines, participation in a support group, or observation to see if symptoms improve.  Contact your health care provider if your symptoms get worse or do not improve in six months.  If you ever feel like you may hurt yourself or others, or have thoughts about taking your own life, get help right away. This information is not intended to replace advice given to you by your health care provider. Make sure you discuss any questions you have with your health care provider. Document Released: 07/17/2006 Document Revised: 01/11/2017 Document Reviewed: 01/11/2017 Elsevier Interactive Patient Education  [employer].

## 2017-09-27 LAB — COMPREHENSIVE METABOLIC PANEL
A/G RATIO: 1.6 (ref 1.2–2.2)
ALT: 15 IU/L (ref 0–32)
AST: 16 IU/L (ref 0–40)
Albumin: 4.5 g/dL (ref 3.5–4.8)
Alkaline Phosphatase: 59 IU/L (ref 39–117)
BILIRUBIN TOTAL: 0.4 mg/dL (ref 0.0–1.2)
BUN/Creatinine Ratio: 11 — ABNORMAL LOW (ref 12–28)
BUN: 15 mg/dL (ref 8–27)
CHLORIDE: 105 mmol/L (ref 96–106)
CO2: 25 mmol/L (ref 20–29)
Calcium: 9.5 mg/dL (ref 8.7–10.3)
Creatinine, Ser: 1.36 mg/dL — ABNORMAL HIGH (ref 0.57–1.00)
GFR calc non Af Amer: 37 mL/min/{1.73_m2} — ABNORMAL LOW (ref >59)
GFR, EST AFRICAN AMERICAN: 43 mL/min/{1.73_m2} — AB (ref >59)
Globulin, Total: 2.8 g/dL (ref 1.5–4.5)
Glucose: 143 mg/dL — ABNORMAL HIGH (ref 65–99)
POTASSIUM: 4.4 mmol/L (ref 3.5–5.2)
Sodium: 147 mmol/L — ABNORMAL HIGH (ref 134–144)
TOTAL PROTEIN: 7.3 g/dL (ref 6.0–8.5)

## 2017-09-27 LAB — CBC WITH DIFFERENTIAL/PLATELET
Basophils Absolute: 0 10*3/uL (ref 0.0–0.2)
Basos: 1 %
EOS (ABSOLUTE): 0.2 10*3/uL (ref 0.0–0.4)
EOS: 3 %
HEMATOCRIT: 37.1 % (ref 34.0–46.6)
Hemoglobin: 12.2 g/dL (ref 11.1–15.9)
Immature Grans (Abs): 0 10*3/uL (ref 0.0–0.1)
Immature Granulocytes: 0 %
LYMPHS ABS: 2.4 10*3/uL (ref 0.7–3.1)
Lymphs: 34 %
MCH: 31.5 pg (ref 26.6–33.0)
MCHC: 32.9 g/dL (ref 31.5–35.7)
MCV: 96 fL (ref 79–97)
MONOS ABS: 0.4 10*3/uL (ref 0.1–0.9)
Monocytes: 6 %
Neutrophils Absolute: 3.9 10*3/uL (ref 1.4–7.0)
Neutrophils: 56 %
Platelets: 210 10*3/uL (ref 150–379)
RBC: 3.87 x10E6/uL (ref 3.77–5.28)
RDW: 15.4 % (ref 12.3–15.4)
WBC: 7 10*3/uL (ref 3.4–10.8)

## 2017-09-27 LAB — MICROALBUMIN / CREATININE URINE RATIO
Creatinine, Urine: 278.6 mg/dL
Microalb/Creat Ratio: 9.2 mg/g creat (ref 0.0–30.0)
Microalbumin, Urine: 25.6 ug/mL

## 2017-09-27 LAB — TSH: TSH: 5.33 u[IU]/mL — ABNORMAL HIGH (ref 0.450–4.500)

## 2017-09-27 MED ORDER — LEVOTHYROXINE SODIUM 88 MCG PO TABS: 88.0000 ug | ORAL_TABLET | Freq: Every day | ORAL | 1 refills | Status: DC

## 2017-10-02 ENCOUNTER — Ambulatory Visit: Payer: Self-pay

## 2017-10-02 NOTE — Telephone Encounter (Signed)
Triaged pt. with c/o onset of severe dizziness and nausea, when she takes Metformin. Reported she started Metformin 11/2, and the dizziness started when she took her 2nd dose that day.  Reported she continued taking it twice/day, as prescribed, until 11/5.  Stated she just can't stand the way it makes her feel.  Described severe dizziness that starts each time she takes another dose of Metformin; verb. that she would need to lay down, or felt she would pass out. Questioned if she had checked her blood sugar, during episodes of dizziness.  Stated she only checked it once, after she had missed a dose of the Metformin, and the BS was 138.  C/o being very tired.  C/o legs and feet hurting.  Reported hx of neuropathy.  Reported she hasn't been eating well.  Questioned if she is staying hydrated?  Reported that she drinks coffee and water.  Encouraged to drink decaffeinated beverages to rehydrate.  Offered an appt. For evaluation.  Appt. Given 10/03/17 @ 1:40 PM with Daphane Shepherd, PA.  Pt. Agreed.  Reviewed Care advice per protocol.  Verb. Understanding.     Reason for Disposition . [1] MODERATE dizziness (e.g., interferes with normal activities) AND [2] has NOT been evaluated by physician for this  (Exception: dizziness caused by heat exposure, sudden standing, or poor fluid intake)  Answer Assessment - Initial Assessment Questions 1. DESCRIPTION: "Describe your dizziness."     Like going around in circles 2. LIGHTHEADED: "Do you feel lightheaded?" (e.g., somewhat faint, woozy, weak upon standing)     Yes, until she sits down.   3. VERTIGO: "Do you feel like either you or the room is spinning or tilting?" (i.e. vertigo)     yes 4. SEVERITY: "How bad is it?"  "Do you feel like you are going to faint?" "Can you stand and walk?"   - MILD - walking normally   - MODERATE - interferes with normal activities (e.g., work, school)    - SEVERE - unable to stand, requires support to walk, feels like passing out now.       Severe; can't stand up 5. ONSET:  "When did the dizziness begin?"     After the evening dose (2nd dose)  6. AGGRAVATING FACTORS: "Does anything make it worse?" (e.g., standing, change in head position)     Moving and changing position; couldn't continue standing; felt like she could pass out 7. HEART RATE: "Can you tell me your heart rate?" "How many beats in 15 seconds?"  (Note: not all patients can do this)       Denies any dizziness at this time.    8. CAUSE: "What do you think is causing the dizziness?"     Metformin 9. RECURRENT SYMPTOM: "Have you had dizziness before?" If so, ask: "When was the last time?" "What happened that time?"     No, never like this 10. OTHER SYMPTOMS: "Do you have any other symptoms?" (e.g., fever, chest pain, vomiting, diarrhea, bleeding)       Denies any chest pain; c/o nausea with the dizziness; denies vomiting; denies bleeding at this time 11. PREGNANCY: "Is there any chance you are pregnant?" "When was your last menstrual period?"       No  Protocols used: DIZZINESS [facility]

## 2017-10-03 ENCOUNTER — Ambulatory Visit (INDEPENDENT_AMBULATORY_CARE_PROVIDER_SITE_OTHER): Payer: Medicare Other | Admitting: Physician Assistant

## 2017-10-03 ENCOUNTER — Encounter: Payer: Self-pay | Admitting: Physician Assistant

## 2017-10-03 VITALS — BP 134/82 | HR 67 | Temp 98.3°F | Resp 18 | Ht 64.75 in | Wt 173.8 lb

## 2017-10-03 DIAGNOSIS — T50905A Adverse effect of unspecified drugs, medicaments and biological substances, initial encounter: Secondary | ICD-10-CM | POA: Diagnosis not present

## 2017-10-03 NOTE — Progress Notes (Signed)
Patient ID: Sherri Burns, female    DOB: 10-Jul-1936, 81 y.o.   MRN: [id number]  PCP: Shawnee Knapp, MD  Chief Complaint  Patient presents with  . Medication Problem    Pt states Dr. Brigitte Pulse gave her Metformin for the first time last week. Pt states she is experiencing leg cramping, nausea, dizziness, and bodyaches.  Pt states she has to hold on to things to get place to place.    Subjective:   Presents for evaluation of leg cramping, nausea, body aches and lightheadedness since starting metformin following her visit with her PCP, Dr. Brigitte Pulse, 09/26/2017.  A1C 8.0%, steadily rising over the past year, up from 6.4% 03/2016. CBC was normal. TSH was mildly elevated, 5.3, and so her dose will increase from 75 mcg to 88 mcg with the next fill.  She reports that the symptoms began the morning following the first dose of metformin. She felt dizzy, had to hold on to things when she walked, and was walking into things. Entire body was painful, especially the neck, back and upper abdomen. Nausea without vomiting. Felt hot, but no fever, chills.  Last dose of metformin was 09/30/2017, and the symptoms have essentially resolved. She has some intermittent dizziness, and bilateral 8/10 leg pain, as well as shotting pain across the upper back.  Complains of continued constipation. Miralax has been ineffective. A friend has recommended she ask for Linzess. A finger of Aloe Vera juice and a cup of coffee helps. She has not scheduled the appointment with GI.  Review of Systems Constitutional: Negative for fever.  HENT: Positive for ear pain, sinus pressure and sinus pain. Negative for congestion, postnasal drip, rhinorrhea and sore throat.   Respiratory: Positive for cough.   Neurological: Positive for dizziness, light-headedness and headaches. Negative for syncope.       Headaches improved with Tylenol     Patient Active Problem List   Diagnosis Date Noted  . Hyperlipidemia   . Abnormal EKG  02/07/2016  . Type 2 diabetes mellitus with diabetic polyneuropathy, without long-term current use of insulin ([place]) 09/23/2012  . Essential hypertension   . Hypothyroidism   . CKD (chronic kidney disease), stage III ([place])      Prior to Admission medications   Medication Sig Start Date End Date Taking? Authorizing Provider  acetaminophen (TYLENOL) 500 MG tablet Take 500 mg by mouth every 6 (six) hours as needed for mild pain.   Yes [provider]  aspirin EC 81 MG tablet Take 81 mg by mouth daily.   Yes [provider]  cholecalciferol (VITAMIN D) 1000 units tablet Take 1,000 Units by mouth daily.   Yes [provider]  clonazePAM (KLONOPIN) 1 MG tablet 1/2 tab po qam, 1/2 tab po qlunch, and 1 tab po qhs. 09/26/17  Yes Shawnee Knapp, MD  diltiazem (CARDIZEM CD) 360 MG 24 hr capsule Take 1 capsule (360 mg total) by mouth daily. 09/26/17  Yes Shawnee Knapp, MD  fluticasone (FLONASE) 50 MCG/ACT nasal spray Place 1 spray into both nostrils daily. 07/25/17  Yes Rutherford Guys, MD  furosemide (LASIX) 40 MG tablet Take 1 tablet (40 mg total) by mouth daily. 09/26/17  Yes Shawnee Knapp, MD  glipiZIDE (GLUCOTROL) 10 MG tablet TAKE TWO TABLETS (20 MG TOTAL) BY MOUTH TWO (TWO) TIMES DAILY BEFORE A MEAL. [place] 09/26/17  Yes Shawnee Knapp, MD  levothyroxine (SYNTHROID, LEVOTHROID) 88 MCG tablet Take 1 tablet (88 mcg  total) by mouth daily before breakfast. 09/27/17  Yes Shawnee Knapp, MD  losartan (COZAAR) 25 MG tablet Take 1 tablet (25 mg total) by mouth daily. 09/26/17  Yes Shawnee Knapp, MD  metFORMIN (GLUCOPHAGE) 500 MG tablet Take 1 tablet (500 mg total) by mouth 2 (two) times daily with a meal. 09/26/17  Yes Shawnee Knapp, MD  metoprolol tartrate (LOPRESSOR) 100 MG tablet Take 1 tablet (100 mg total) by mouth 2 (two) times daily. 09/26/17  Yes Shawnee Knapp, MD  omeprazole (PRILOSEC) 40 MG capsule Take 1 capsule (40 mg total) by mouth daily. 09/26/17  Yes Shawnee Knapp, MD  ondansetron  (ZOFRAN) 4 MG tablet Take 1 tablet (4 mg total) by mouth every 6 (six) hours. 01/29/17  Yes Judah Carchi, PA-C  polyethylene glycol (MIRALAX / GLYCOLAX) packet Take 8.5 g by mouth every morning.    Yes [provider]  vitamin E 100 UNIT capsule Take 100 Units by mouth daily.   Yes [provider]     Allergies  Allergen Reactions  . Codeine Other (See Comments)    hallucinations  . Ciprocin-Fluocin-Procin [Fluocinolone Acetonide] Other (See Comments)    Unknown reaction  . Clonidine Derivatives Other (See Comments)    Dry mouth  . Crestor [Rosuvastatin Calcium] Other (See Comments)    cramps  . Penicillins Other (See Comments)    Has patient had a PCN reaction causing immediate rash, facial/tongue/throat swelling, SOB or lightheadedness with hypotension: no Has patient had a PCN reaction causing severe rash involving mucus membranes or skin necrosis: no Has patient had a PCN reaction that required hospitalization : no Has patient had a PCN reaction occurring within the last 10 years: no -Hallucinates If all of the above answers are "NO", then may proceed with Cephalosporin use.   . Simvastatin Other (See Comments)    Upset stomach   . Tessalon Perles Other (See Comments)    Gi upset        Objective:  Physical Exam  Constitutional: She is oriented to person, place, and time. She appears well-developed and well-nourished. She is active and cooperative. No distress.  BP 134/82 (BP Location: Left Arm, Patient Position: Sitting, Cuff Size: Normal)   Pulse 67   Temp 98.3 F (36.8 C) (Oral)   Resp 18   Ht 5' 4.75" (1.645 m)   Wt 173 lb 12.8 oz (78.8 kg)   SpO2 97%   BMI 29.15 kg/m   HENT:  Head: Normocephalic and atraumatic.  Right Ear: Hearing, tympanic membrane, external ear and ear canal normal.  Left Ear: Hearing, tympanic membrane, external ear and ear canal normal.  Nose: Nose normal.  Mouth/Throat: Oropharynx is clear and moist and mucous  membranes are normal. She does not have dentures (edentulous, uncompensated today).  Eyes: Conjunctivae are normal. No scleral icterus.  Neck: Normal range of motion. Neck supple. No thyromegaly present.  Cardiovascular: Normal rate, regular rhythm and normal heart sounds.  Pulses:      Radial pulses are 2+ on the right side, and 2+ on the left side.  Pulmonary/Chest: Effort normal and breath sounds normal.  Abdominal: There is no hepatosplenomegaly. There is tenderness in the right upper quadrant, epigastric area and left upper quadrant. There is no rebound and no guarding.  Lymphadenopathy:       Head (right side): No tonsillar, no preauricular, no posterior auricular and no occipital adenopathy present.       Head (left side): No tonsillar, no  preauricular, no posterior auricular and no occipital adenopathy present.    She has no cervical adenopathy.       Right: No supraclavicular adenopathy present.       Left: No supraclavicular adenopathy present.  Neurological: She is alert and oriented to person, place, and time. No sensory deficit.  Skin: Skin is warm, dry and intact. No rash noted. No cyanosis or erythema. Nails show no clubbing.  Psychiatric: She has a normal mood and affect. Her speech is normal and behavior is normal.           Assessment & Plan:   1. Medication reaction, initial encounter Try resuming metformin, but 250 mg one time daily. If she tolerates that, she can increase it to BID, then retry 500 mg, and so on. If she cannot tolerate it, she'll let Dr. Brigitte Pulse know so that another treatment can be started. Encouraged healthy eating and regular exercise. Declined to prescribe Linzess. Advised that metformin can help constipation, and if a cup of coffee and some Aloe Vera juice helps, she may continue that until she sees GI. Encouraged her to schedule her follow-up visit.Marland Kitchen    Return if symptoms worsen or fail to improve, otherwise as planned with Dr. Brigitte Pulse.   Fara Chute, PA-C [facility]

## 2017-10-03 NOTE — Patient Instructions (Addendum)
It certainly appears that the symptoms you experienced were due to the metformin. I recommend that you restart it, but take only HALF a tablet ONE time a day (at bedtime). Let's see if you tolerate that better. If the symptoms recur, let me know and we'll stop it and try something else. If you tolerate it, we could try increasing the dose slowly.  Please follow-up with Dr. Earlean Shawl, as planned.    IF you received an x-ray today, you will receive an invoice from [facility]. Please contact [facility] at [phone] with questions or concerns regarding your invoice.   IF you received labwork today, you will receive an invoice from [facility]. Please contact [facility] at [phone] with questions or concerns regarding your invoice.   Our billing staff will not be able to assist you with questions regarding bills from these companies.  You will be contacted with the lab results as soon as they are available. The fastest way to get your results is to activate your My Chart account. Instructions are located on the last page of this paperwork. If you have not heard from [place] regarding the results in 2 weeks, please contact this office.

## 2017-10-03 NOTE — Progress Notes (Signed)
Subjective:    Patient ID: Sherri Burns, female    DOB: 15-Jan-1936, 81 y.o.   MRN: [id number]  HPI  Sherri Burns is 81 year old [place] American female with a past medical history significant for type 2 diabetes mellitus, hypertension, chronic kidney disease, and hyperlipidemia who presents today with leg cramping, nausea, lightheadedness and bodyaches.   Sherri Burns saw Dr. Brigitte Pulse 09/26/17 for a follow-up of her chronic medical conditions. She was instructed to re-start Metformin 500mg  2 times per day. Sherri Burns reports her first dose of the Metformin 500mg  was on the night of 09/26/17. She states the next morning her symptoms began. Sherri Burns states was walking into walls and very dizzy when she gets out of bed. Patient reports she had to hold onto things when she was walking. Sherri Burns states everything in her body was hurting as well. She reports she she had neck pain, back pain, and upper abdominal pain. She states she felt nauseas, but denies vomiting. Sherri Burns also reports feeling hot, but denies fevers.   Sherri Burns reports her last dose of Metformin was on 09/30/17. She states her symptoms have pretty much resolved. She states she has some dizziness and bilateral leg pain. Sherri Burns rates the pain an 8/10. She endorses shooting pain across her upper back, but denies numbness and tingling in legs.   Review of Systems  Constitutional: Negative for fever.  HENT: Positive for ear pain, sinus pressure and sinus pain. Negative for congestion, postnasal drip, rhinorrhea and sore throat.   Respiratory: Positive for cough.   Neurological: Positive for dizziness, light-headedness and headaches. Negative for syncope.       Headaches improved with Tylenol   Medications:  Prior to Admission medications   Medication Sig Start Date End Date Taking? Authorizing Provider  acetaminophen (TYLENOL) 500 MG tablet Take 500 mg by mouth every 6 (six) hours as needed for mild pain.   Yes [provider]  aspirin EC 81 MG tablet Take 81 mg by mouth daily.   Yes [provider]  cholecalciferol (VITAMIN D) 1000 units tablet Take 1,000 Units by mouth daily.   Yes [provider]  clonazePAM (KLONOPIN) 1 MG tablet 1/2 tab po qam, 1/2 tab po qlunch, and 1 tab po qhs. 09/26/17  Yes Shawnee Knapp, MD  diltiazem (CARDIZEM CD) 360 MG 24 hr capsule Take 1 capsule (360 mg total) by mouth daily. 09/26/17  Yes Shawnee Knapp, MD  fluticasone (FLONASE) 50 MCG/ACT nasal spray Place 1 spray into both nostrils daily. 07/25/17  Yes Rutherford Guys, MD  furosemide (LASIX) 40 MG tablet Take 1 tablet (40 mg total) by mouth daily. 09/26/17  Yes Shawnee Knapp, MD  glipiZIDE (GLUCOTROL) 10 MG tablet TAKE TWO TABLETS (20 MG TOTAL) BY MOUTH TWO (TWO) TIMES DAILY BEFORE A MEAL. [place] 09/26/17  Yes Shawnee Knapp, MD  levothyroxine (SYNTHROID, LEVOTHROID) 88 MCG tablet Take 1 tablet (88 mcg total) by mouth daily before breakfast. 09/27/17  Yes Shawnee Knapp, MD  losartan (COZAAR) 25 MG tablet Take 1 tablet (25 mg total) by mouth daily. 09/26/17  Yes Shawnee Knapp, MD  metFORMIN (GLUCOPHAGE) 500 MG tablet Take 1 tablet (500 mg total) by mouth 2 (two) times daily with a meal. 09/26/17  Yes Shawnee Knapp, MD  metoprolol tartrate (LOPRESSOR) 100 MG tablet Take 1 tablet (100 mg total) by mouth 2 (two) times daily. 09/26/17  Yes Shawnee Knapp,  MD  omeprazole (PRILOSEC) 40 MG capsule Take 1 capsule (40 mg total) by mouth daily. 09/26/17  Yes Shawnee Knapp, MD  ondansetron (ZOFRAN) 4 MG tablet Take 1 tablet (4 mg total) by mouth every 6 (six) hours. 01/29/17  Yes Jeffery, Chelle, PA-C  polyethylene glycol (MIRALAX / GLYCOLAX) packet Take 8.5 g by mouth every morning.    Yes [provider]  vitamin E 100 UNIT capsule Take 100 Units by mouth daily.   Yes [provider]   Allergies:  Allergies  Allergen Reactions  . Codeine Other (See Comments)    hallucinations  . Ciprocin-Fluocin-Procin  [Fluocinolone Acetonide] Other (See Comments)    Unknown reaction  . Clonidine Derivatives Other (See Comments)    Dry mouth  . Crestor [Rosuvastatin Calcium] Other (See Comments)    cramps  . Penicillins Other (See Comments)    Has patient had a PCN reaction causing immediate rash, facial/tongue/throat swelling, SOB or lightheadedness with hypotension: no Has patient had a PCN reaction causing severe rash involving mucus membranes or skin necrosis: no Has patient had a PCN reaction that required hospitalization : no Has patient had a PCN reaction occurring within the last 10 years: no -Hallucinates If all of the above answers are "NO", then may proceed with Cephalosporin use.   . Simvastatin Other (See Comments)    Upset stomach   . Tessalon Perles Other (See Comments)    Gi upset    Chronic Medical Conditions:  Patient Active Problem List   Diagnosis Date Noted  . Hyperlipidemia   . Abnormal EKG 02/07/2016  . Type 2 diabetes mellitus with diabetic polyneuropathy, without long-term current use of insulin ([place]) 09/23/2012  . Essential hypertension   . Hypothyroidism   . CKD (chronic kidney disease), stage III ([facility])       Objective:   Physical Exam  Constitutional: She appears well-developed and well-nourished. She is active and cooperative. No distress.  BP 134/82 (BP Location: Left Arm, Patient Position: Sitting, Cuff Size: Normal)   Pulse 67   Temp 98.3 F (36.8 C) (Oral)   Resp 18   Ht 5' 4.75" (1.645 m)   Wt 173 lb 12.8 oz (78.8 kg)   SpO2 97%   BMI 29.15 kg/m    HENT:  Head: Normocephalic and atraumatic.  Right Ear: External ear normal.  Left Ear: External ear normal.  Nose: Nose normal. No rhinorrhea.  Mouth/Throat: She has dentures (not wearing them today). Normal dentition. No oropharyngeal exudate or posterior oropharyngeal erythema.  Eyes: Conjunctivae and EOM are normal. Pupils are equal, round, and reactive to light. Right eye exhibits no nystagmus.  Left eye exhibits no nystagmus.  Neck: Normal range of motion. Neck supple. No thyromegaly present.  Cardiovascular: Normal rate, regular rhythm, S1 normal, S2 normal and normal heart sounds.  No murmur heard. Pulses:      Radial pulses are 2+ on the right side, and 2+ on the left side.  Pulmonary/Chest: Effort normal and breath sounds normal.  Abdominal: Soft. Bowel sounds are normal. There is tenderness in the right upper quadrant, epigastric area and left upper quadrant.  Lymphadenopathy:    She has no cervical adenopathy.  Neurological: She is alert. She has normal strength. No cranial nerve deficit. She exhibits normal muscle tone. Coordination and gait normal.  Skin: Skin is warm and dry.      Assessment & Plan:  1. Medication reaction, initial encounter - Decreased Metformin to 250mg  daily to see if patient can  tolerate a lower dose. - Plan to increase dose if patient can tolerate 250mg  daily - Encouraged patient to attempt to eat well-balanced meals that include fruits and vegetables, whole grains, lean proteins, and low sodium foods. Also encouraged patient to get at least 30 minutes of exercise 5 times per week.  - Also encouraged patient to check blood glucose levels at home.  - Return to clinic in 3 months for diabetes follow-up.  Mikaeel Petrow, PA-S   [employer], PA-S

## 2017-10-14 ENCOUNTER — Other Ambulatory Visit: Payer: Self-pay | Admitting: Family Medicine

## 2017-10-14 NOTE — Telephone Encounter (Signed)
Controlled substance 

## 2017-10-14 NOTE — Telephone Encounter (Signed)
Prescription called in

## 2017-11-21 ENCOUNTER — Other Ambulatory Visit: Payer: Self-pay | Admitting: Family Medicine

## 2017-11-21 DIAGNOSIS — Z9109 Other allergy status, other than to drugs and biological substances: Secondary | ICD-10-CM

## 2017-12-07 ENCOUNTER — Encounter: Payer: Self-pay | Admitting: Family Medicine

## 2017-12-07 ENCOUNTER — Other Ambulatory Visit: Payer: Self-pay

## 2017-12-07 ENCOUNTER — Ambulatory Visit (INDEPENDENT_AMBULATORY_CARE_PROVIDER_SITE_OTHER): Payer: Medicare Other | Admitting: Family Medicine

## 2017-12-07 VITALS — BP 186/84 | HR 59 | Temp 97.9°F | Resp 18 | Ht 65.75 in | Wt 173.4 lb

## 2017-12-07 DIAGNOSIS — I10 Essential (primary) hypertension: Secondary | ICD-10-CM

## 2017-12-07 DIAGNOSIS — G44201 Tension-type headache, unspecified, intractable: Secondary | ICD-10-CM

## 2017-12-07 DIAGNOSIS — E1142 Type 2 diabetes mellitus with diabetic polyneuropathy: Secondary | ICD-10-CM | POA: Diagnosis not present

## 2017-12-07 DIAGNOSIS — M791 Myalgia, unspecified site: Secondary | ICD-10-CM | POA: Diagnosis not present

## 2017-12-07 DIAGNOSIS — R1084 Generalized abdominal pain: Secondary | ICD-10-CM

## 2017-12-07 DIAGNOSIS — N183 Chronic kidney disease, stage 3 unspecified: Secondary | ICD-10-CM

## 2017-12-07 DIAGNOSIS — J0100 Acute maxillary sinusitis, unspecified: Secondary | ICD-10-CM | POA: Diagnosis not present

## 2017-12-07 DIAGNOSIS — M545 Low back pain, unspecified: Secondary | ICD-10-CM

## 2017-12-07 DIAGNOSIS — Z23 Encounter for immunization: Secondary | ICD-10-CM | POA: Diagnosis not present

## 2017-12-07 LAB — POCT URINALYSIS DIP (MANUAL ENTRY)
BILIRUBIN UA: NEGATIVE
Blood, UA: NEGATIVE
Glucose, UA: NEGATIVE mg/dL
Ketones, POC UA: NEGATIVE mg/dL
LEUKOCYTES UA: NEGATIVE
NITRITE UA: NEGATIVE
Protein Ur, POC: 30 mg/dL — AB
Spec Grav, UA: 1.02 (ref 1.010–1.025)
UROBILINOGEN UA: 0.2 U/dL
pH, UA: 7 (ref 5.0–8.0)

## 2017-12-07 LAB — POCT CBC
GRANULOCYTE PERCENT: 53.4 % (ref 37–80)
HEMATOCRIT: 41.2 % (ref 37.7–47.9)
Hemoglobin: 13.5 g/dL (ref 12.2–16.2)
Lymph, poc: 2.8 (ref 0.6–3.4)
MCH, POC: 31.8 pg — AB (ref 27–31.2)
MCHC: 32.7 g/dL (ref 31.8–35.4)
MCV: 97.3 fL — AB (ref 80–97)
MID (CBC): 0.7 (ref 0–0.9)
MPV: 8.5 fL (ref 0–99.8)
POC GRANULOCYTE: 4 (ref 2–6.9)
POC LYMPH %: 36.9 % (ref 10–50)
POC MID %: 0.7 % (ref 0–12)
Platelet Count, POC: 222 10*3/uL (ref 142–424)
RBC: 4.24 M/uL (ref 4.04–5.48)
RDW, POC: 15 %
WBC: 7.5 10*3/uL (ref 4.6–10.2)

## 2017-12-07 LAB — GLUCOSE, POCT (MANUAL RESULT ENTRY): POC GLUCOSE: 121 mg/dL — AB (ref 70–99)

## 2017-12-07 MED ORDER — AZITHROMYCIN 250 MG PO TABS: ORAL_TABLET | ORAL | 0 refills | Status: DC

## 2017-12-07 NOTE — Addendum Note (Signed)
Addended by: Berneice Gandy on: 12/07/2017 04:46 PM   Modules accepted: Orders

## 2017-12-07 NOTE — Patient Instructions (Addendum)
Please take your Metformin 500mg  1/2 tablet with supper every day. Please take your blood pressure medications every morning (even when you have a doctor's appointment). Take Cardizem and Lasix as soon as you return home.   Low Back Sprain Rehab Ask your health care provider which exercises are safe for you. Do exercises exactly as told by your health care provider and adjust them as directed. It is normal to feel mild stretching, pulling, tightness, or discomfort as you do these exercises, but you should stop right away if you feel sudden pain or your pain gets worse. Do not begin these exercises until told by your health care provider. Stretching and range of motion exercises These exercises warm up your muscles and joints and improve the movement and flexibility of your back. These exercises also help to relieve pain, numbness, and tingling. Exercise A: Lumbar rotation  1. Lie on your back on a firm surface and bend your knees. 2. Straighten your arms out to your sides so each arm forms an "L" shape with a side of your body (a 90 degree angle). 3. Slowly move both of your knees to one side of your body until you feel a stretch in your lower back. Try not to let your shoulders move off of the floor. 4. Hold for __________ seconds. 5. Tense your abdominal muscles and slowly move your knees back to the starting position. 6. Repeat this exercise on the other side of your body. Repeat __________ times. Complete this exercise __________ times a day. Exercise B: Prone extension on elbows  1. Lie on your abdomen on a firm surface. 2. Prop yourself up on your elbows. 3. Use your arms to help lift your chest up until you feel a gentle stretch in your abdomen and your lower back. ? This will place some of your body weight on your elbows. If this is uncomfortable, try stacking pillows under your chest. ? Your hips should stay down, against the surface that you are lying on. Keep your hip and back  muscles relaxed. 4. Hold for __________ seconds. 5. Slowly relax your upper body and return to the starting position. Repeat __________ times. Complete this exercise __________ times a day. Strengthening exercises These exercises build strength and endurance in your back. Endurance is the ability to use your muscles for a long time, even after they get tired. Exercise C: Pelvic tilt 1. Lie on your back on a firm surface. Bend your knees and keep your feet flat. 2. Tense your abdominal muscles. Tip your pelvis up toward the ceiling and flatten your lower back into the floor. ? To help with this exercise, you may place a small towel under your lower back and try to push your back into the towel. 3. Hold for __________ seconds. 4. Let your muscles relax completely before you repeat this exercise. Repeat __________ times. Complete this exercise __________ times a day. Exercise D: Alternating arm and leg raises  1. Get on your hands and knees on a firm surface. If you are on a hard floor, you may want to use padding to cushion your knees, such as an exercise mat. 2. Line up your arms and legs. Your hands should be below your shoulders, and your knees should be below your hips. 3. Lift your left leg behind you. At the same time, raise your right arm and straighten it in front of you. ? Do not lift your leg higher than your hip. ? Do not lift your  arm higher than your shoulder. ? Keep your abdominal and back muscles tight. ? Keep your hips facing the ground. ? Do not arch your back. ? Keep your balance carefully, and do not hold your breath. 4. Hold for __________ seconds. 5. Slowly return to the starting position and repeat with your right leg and your left arm. Repeat __________ times. Complete this exercise __________ times a day. Exercise E: Abdominal set with straight leg raise  1. Lie on your back on a firm surface. 2. Bend one of your knees and keep your other leg straight. 3. Tense  your abdominal muscles and lift your straight leg up, 4-6 inches (10-15 cm) off the ground. 4. Keep your abdominal muscles tight and hold for __________ seconds. ? Do not hold your breath. ? Do not arch your back. Keep it flat against the ground. 5. Keep your abdominal muscles tense as you slowly lower your leg back to the starting position. 6. Repeat with your other leg. Repeat __________ times. Complete this exercise __________ times a day. Posture and body mechanics  Body mechanics refers to the movements and positions of your body while you do your daily activities. Posture is part of body mechanics. Good posture and healthy body mechanics can help to relieve stress in your body's tissues and joints. Good posture means that your spine is in its natural S-curve position (your spine is neutral), your shoulders are pulled back slightly, and your head is not tipped forward. The following are general guidelines for applying improved posture and body mechanics to your everyday activities. Standing   When standing, keep your spine neutral and your feet about hip-width apart. Keep a slight bend in your knees. Your ears, shoulders, and hips should line up.  When you do a task in which you stand in one place for a long time, place one foot up on a stable object that is 2-4 inches (5-10 cm) high, such as a footstool. This helps keep your spine neutral. Sitting   When sitting, keep your spine neutral and keep your feet flat on the floor. Use a footrest, if necessary, and keep your thighs parallel to the floor. Avoid rounding your shoulders, and avoid tilting your head forward.  When working at a desk or a computer, keep your desk at a height where your hands are slightly lower than your elbows. Slide your chair under your desk so you are close enough to maintain good posture.  When working at a computer, place your monitor at a height where you are looking straight ahead and you do not have to tilt  your head forward or downward to look at the screen. Resting   When lying down and resting, avoid positions that are most painful for you.  If you have pain with activities such as sitting, bending, stooping, or squatting (flexion-based activities), lie in a position in which your body does not bend very much. For example, avoid curling up on your side with your arms and knees near your chest (fetal position).  If you have pain with activities such as standing for a long time or reaching with your arms (extension-based activities), lie with your spine in a neutral position and bend your knees slightly. Try the following positions:  Lying on your side with a pillow between your knees.  Lying on your back with a pillow under your knees. Lifting   When lifting objects, keep your feet at least shoulder-width apart and tighten your abdominal muscles.  [employer]  your knees and hips and keep your spine neutral. It is important to lift using the strength of your legs, not your back. Do not lock your knees straight out.  Always ask for help to lift heavy or awkward objects. This information is not intended to replace advice given to you by your health care provider. Make sure you discuss any questions you have with your health care provider. Document Released: 11/12/2005 Document Revised: 07/19/2016 Document Reviewed: 08/24/2015 Elsevier Interactive Patient Education  2018 [employer].  IF you received an x-ray today, you will receive an invoice from [facility]. Please contact [facility] at [phone] with questions or concerns regarding your invoice.   IF you received labwork today, you will receive an invoice from [facility]. Please contact [facility] at [phone] with questions or concerns regarding your invoice.   Our billing staff will not be able to assist you with questions regarding bills from these companies.  You will be contacted with the lab results as soon as they  are available. The fastest way to get your results is to activate your My Chart account. Instructions are located on the last page of this paperwork. If you have not heard from [place] regarding the results in 2 weeks, please contact this office.

## 2017-12-07 NOTE — Progress Notes (Signed)
Subjective:    Patient ID: Sherri Burns, female    DOB: 11/10/1936, 82 y.o.   MRN: [id number]  12/07/2017  Headache (X 2- 3 weeks); Shoulder Pain (X- flu shot- left ); Back Pain (X 2 mth- lower back ); and Depression (screening done)    HPI This 82 y.o. female presents for evaluation of HEADACHE, elevated blood pressure, hurting all over.  Husband passed away; Dr. Everlene Farrier attended husband's funeral.  Selling house; staying with girlfriend for three weeks; very comfortable.  Friend's legs are really swollen. Patient only has single kidney.  Husband with pancreatic cancer four years ago.   Has a boyfriend.  Does not see boyfriend all the time.  Nobody knows that patient has a boyfriend; he is 85 years old.  Boyfriend does cars.   Headache started after going to friend's house;sick at that time three weeks ago.  Thinks that current mattress is really soft and sinks in the bed.  Has been through a lot.  Has undergone chiropractic care in the past; has lumbar scoliosis.  Lower back pain.  R groin pain.  No longer having headache.  No blood pressure medication this morning.   Took Metoprolol 100mg  in the office upon arrival.   Appetite is bad since staying there.  Knows that must get away; finishing floor.  [place] clean.    Has not taken Cardizem this morning; will take when gets home. Has not taken Lasix this morning yet.  Cardizem 360mg  daily. Losartan 25mg  daily. Metoprolol 100mg  bid.   Son committed suicide in 2003; has never gotten over it.  Using Flonase daily. Taking Aspercream.  Not working well.  Biofreeze.  Taking Tylenol 2 tablets every night.    BP Readings from Last 3 Encounters:  12/07/17 (!) 186/84  10/03/17 134/82  08/02/17 (!) 158/80   Wt Readings from Last 3 Encounters:  12/07/17 173 lb 6.4 oz (78.7 kg)  10/03/17 173 lb 12.8 oz (78.8 kg)  09/26/17 173 lb 9.6 oz (78.7 kg)   Immunization History  Administered Date(s) Administered  . Influenza Split 10/23/2011,  09/23/2012  . Influenza,inj,Quad PF,6+ Mos 08/11/2013, 09/07/2014, 07/28/2015, 10/02/2016, 12/07/2017  . Pneumococcal Conjugate-13 11/29/2015  . Pneumococcal Polysaccharide-23 12/05/2004  . Td 08/26/2007    Review of Systems  Constitutional: Positive for appetite change. Negative for chills, diaphoresis, fatigue and fever.  HENT: Positive for congestion, postnasal drip, rhinorrhea, sinus pressure, sinus pain and voice change. Negative for ear pain, sore throat and trouble swallowing.   Eyes: Negative for visual disturbance.  Respiratory: Negative for cough and shortness of breath.   Cardiovascular: Positive for leg swelling. Negative for chest pain and palpitations.  Gastrointestinal: Negative for abdominal pain, constipation, diarrhea, nausea and vomiting.  Endocrine: Negative for cold intolerance, heat intolerance, polydipsia, polyphagia and polyuria.  Musculoskeletal: Positive for arthralgias and back pain.  Neurological: Positive for headaches. Negative for dizziness, tremors, seizures, syncope, facial asymmetry, speech difficulty, weakness, light-headedness and numbness.  Psychiatric/Behavioral: The patient is nervous/anxious.     Past Medical History:  Diagnosis Date  . Abnormal EKG 02/07/2016   Inferolateral T wave inversion and ST depression.  . Acute calculous cholecystitis   . Anxiety   . Arthritis   . Colon polyps 11/2008   tubular adenoma  . Diabetes mellitus   . Esophageal problem    esophageal dilation  . GERD (gastroesophageal reflux disease)    + hpylori  . Headache   . Hyperlipidemia   . Hypertension   . Hypothyroidism   .  Ischemic colitis ([place])   . Renal cancer, right ([place]) 2010   s/p Rt nephrectomy by Dr. Rosana Hoes  . Renal insufficiency    Past Surgical History:  Procedure Laterality Date  . ABDOMINAL HYSTERECTOMY  1978   partial  . CHOLECYSTECTOMY N/A 04/04/2016   Procedure: LAPAROSCOPIC CHOLECYSTECTOMY;  Surgeon: Autumn Messing III, MD;  Location: [facility];   Service: General;  Laterality: N/A;  . COLONOSCOPY    . ESOPHAGEAL DILATION  2016  . HERNIA REPAIR    . LAPAROSCOPIC CHOLECYSTECTOMY  04/04/2016  . NEPHRECTOMY Right 2010   Dr. Rosana Hoes, urology, due to renal cancer   Allergies  Allergen Reactions  . Codeine Other (See Comments)    hallucinations  . Ciprocin-Fluocin-Procin [Fluocinolone Acetonide] Other (See Comments)    Unknown reaction  . Clonidine Derivatives Other (See Comments)    Dry mouth  . Crestor [Rosuvastatin Calcium] Other (See Comments)    cramps  . Penicillins Other (See Comments)    Has patient had a PCN reaction causing immediate rash, facial/tongue/throat swelling, SOB or lightheadedness with hypotension: no Has patient had a PCN reaction causing severe rash involving mucus membranes or skin necrosis: no Has patient had a PCN reaction that required hospitalization : no Has patient had a PCN reaction occurring within the last 10 years: no -Hallucinates If all of the above answers are "NO", then may proceed with Cephalosporin use.   . Simvastatin Other (See Comments)    Upset stomach   . Tessalon Perles Other (See Comments)    Gi upset    Current Outpatient Medications on File Prior to Visit  Medication Sig Dispense Refill  . acetaminophen (TYLENOL) 500 MG tablet Take 500 mg by mouth every 6 (six) hours as needed for mild pain.    Marland Kitchen aspirin EC 81 MG tablet Take 81 mg by mouth daily.    . cholecalciferol (VITAMIN D) 1000 units tablet Take 1,000 Units by mouth daily.    . clonazePAM (KLONOPIN) 1 MG tablet 1/2 tab po qam, 1/2 tab po qlunch, and 1 tab po qhs. 60 tablet 3  . diltiazem (CARDIZEM CD) 360 MG 24 hr capsule Take 1 capsule (360 mg total) by mouth daily. 90 capsule 1  . fluticasone (FLONASE) 50 MCG/ACT nasal spray PLACE 1 SPRAY INTO BOTH NOSTRILS DAILY. 16 g 1  . furosemide (LASIX) 40 MG tablet Take 1 tablet (40 mg total) by mouth daily. 90 tablet 1  . glipiZIDE (GLUCOTROL) 10 MG tablet TAKE TWO TABLETS (20  MG TOTAL) BY MOUTH TWO (TWO) TIMES DAILY BEFORE A MEAL. 30 MINUTES PRIOR 360 tablet 1  . levothyroxine (SYNTHROID, LEVOTHROID) 88 MCG tablet Take 1 tablet (88 mcg total) by mouth daily before breakfast. 90 tablet 1  . losartan (COZAAR) 25 MG tablet Take 1 tablet (25 mg total) by mouth daily. 90 tablet 1  . metFORMIN (GLUCOPHAGE) 500 MG tablet Take 1 tablet (500 mg total) by mouth 2 (two) times daily with a meal. 180 tablet 1  . metoprolol tartrate (LOPRESSOR) 100 MG tablet Take 1 tablet (100 mg total) by mouth 2 (two) times daily. 180 tablet 1  . omeprazole (PRILOSEC) 40 MG capsule Take 1 capsule (40 mg total) by mouth daily. 90 capsule 1  . ondansetron (ZOFRAN) 4 MG tablet Take 1 tablet (4 mg total) by mouth every 6 (six) hours. 12 tablet 1  . polyethylene glycol (MIRALAX / GLYCOLAX) packet Take 8.5 g by mouth every morning.     . vitamin E 100 UNIT  capsule Take 100 Units by mouth daily.     No current facility-administered medications on file prior to visit.    Social History   Socioeconomic History  . Marital status: Widowed    Spouse name: Not on file  . Number of children: 2  . Years of education: Not on file  . Highest education level: Not on file  Social Needs  . Financial resource strain: Not on file  . Food insecurity - worry: Not on file  . Food insecurity - inability: Not on file  . Transportation needs - medical: Not on file  . Transportation needs - non-medical: Not on file  Occupational History  . Not on file  Tobacco Use  . Smoking status: Current Some Day Smoker    Packs/day: 0.25    Years: 55.00    Pack years: 13.75    Types: Cigarettes  . Smokeless tobacco: Never Used  . Tobacco comment: cut back amount still  trying to quit  Substance and Sexual Activity  . Alcohol use: No    Alcohol/week: 0.0 oz  . Drug use: No  . Sexual activity: Yes  Other Topics Concern  . Not on file  Social History Narrative   Loss of son.   Family History  Problem Relation Age  of Onset  . Cancer Mother        colon, kidney cancer  . Cancer Father        throat cancer  . Cancer Sister   . Heart attack Brother        Objective:    BP (!) 186/84 (BP Location: Right Arm, Patient Position: Sitting)   Pulse (!) 59   Temp 97.9 F (36.6 C) (Oral)   Resp 18   Ht 5' 5.75" (1.67 m)   Wt 173 lb 6.4 oz (78.7 kg)   SpO2 94%   BMI 28.20 kg/m  Physical Exam  Constitutional: She is oriented to person, place, and time. She appears well-developed and well-nourished. No distress.  Smiling om no acute distress.  Joking during exam.  Dancing during exam.  HENT:  Head: Normocephalic and atraumatic.  Right Ear: Tympanic membrane, external ear and ear canal normal.  Left Ear: Tympanic membrane, external ear and ear canal normal.  Nose: Mucosal edema and rhinorrhea present. Right sinus exhibits maxillary sinus tenderness. Right sinus exhibits no frontal sinus tenderness. Left sinus exhibits maxillary sinus tenderness. Left sinus exhibits no frontal sinus tenderness.  Mouth/Throat: Oropharynx is clear and moist.  Eyes: Conjunctivae and EOM are normal. Pupils are equal, round, and reactive to light.  Neck: Normal range of motion. Neck supple. Carotid bruit is not present. No thyromegaly present.  Cardiovascular: Normal rate, regular rhythm, normal heart sounds and intact distal pulses. Exam reveals no gallop and no friction rub.  No murmur heard. Pulmonary/Chest: Effort normal and breath sounds normal. She has no wheezes. She has no rales.  Abdominal: Soft. Bowel sounds are normal. She exhibits no distension and no mass. There is no tenderness. There is no rebound and no guarding.  Musculoskeletal:       Lumbar back: She exhibits normal range of motion, no tenderness, no bony tenderness, no pain, no spasm and normal pulse.  Lymphadenopathy:    She has no cervical adenopathy.  Neurological: She is alert and oriented to person, place, and time. No cranial nerve deficit.  Skin:  Skin is warm and dry. No rash noted. She is not diaphoretic. No erythema. No pallor.  Psychiatric: She has  a normal mood and affect. Her behavior is normal.   No results found. Depression screen [facility] 2/9 12/07/2017 10/03/2017 09/26/2017 07/25/2017 03/07/2017  Decreased Interest 1 0 0 3 2  Down, Depressed, Hopeless 3 0 0 3 3  PHQ - 2 Score 4 0 0 6 5  Altered sleeping 2 - - 0 2  Tired, decreased energy 3 - - 1 1  Change in appetite 2 - - 1 0  Feeling bad or failure about yourself  3 - - 0 1  Trouble concentrating 2 - - 0 0  Moving slowly or fidgety/restless 1 - - 0 0  Suicidal thoughts 1 - - 0 0  PHQ-9 Score 18 - - 8 9  Difficult doing work/chores Somewhat difficult - - - Not difficult at all  Some recent data might be hidden   Fall Risk  12/07/2017 10/03/2017 09/26/2017 07/25/2017 03/07/2017  Falls in the past year? No No No No No  Number falls in past yr: - - - - -  Injury with Fall? - - - - -        Assessment & Plan:   1. Essential hypertension   2. Type 2 diabetes mellitus with diabetic polyneuropathy, without long-term current use of insulin ([place])   3. CKD (chronic kidney disease), stage III ([place])   4. Acute intractable tension-type headache   5. Acute bilateral low back pain without sciatica   6. Myalgia   7. Generalized abdominal pain   8. Acute non-recurrent maxillary sinusitis     Hypertension uncontrolled due to noncompliance with morning medications.  Patient advised that she should always take her medications prior to in a visit.  Patient took metoprolol during visit with improvement in blood pressure.  She will take the remainder of her blood pressure medicines and diuretics upon return home.  Will obtain labs for chronic disease management.  Patient will be moving in the upcoming week and will monitor blood pressure daily.  Headache has improved during visit; thus, feel headache secondary to acute spike in blood pressure.  Normal neurological exam in office.  Patient also  suffering with fatigue and malaise recently  Acute maxillary sinusitis.  Prescription for Zithromax provided.  Continue Flonase daily.  Acute lower back pain due to new mattress and recent moving.  Benign exam and visit.  No radicular symptoms.  Recommend supportive care with rest, stretching and home exercise program daily, heat to area.  EKG unchanged during visit today.   EKG and orthostatic stable during visit.  Will obtain labs however, has undergone significant stressors while living with roommate and remodeling new home..  Benign exam during visit.  Pelvic current symptomatology due to acute stress reaction.  Return to clinic for acute decline.  Orders Placed This Encounter  Procedures  . Urine Culture    Order Specific Question:   Source    Answer:   clean catch  . Comprehensive metabolic panel  . CK  . Orthostatic vital signs  . POCT urinalysis dipstick  . POCT CBC  . POCT glucose (manual entry)  . EKG 12-Lead   Meds ordered this encounter  Medications  . azithromycin (ZITHROMAX) 250 MG tablet    Sig: Two tablets daily x 1 day then one tablet daily x 4 days    Dispense:  6 tablet    Refill:  0    No Follow-up on file.   Macy Lingenfelter Elayne Guerin, M.D. Primary Care at [facility] previously [facility] [address] [place], [place]  [address] [phone] phone [phone] fax

## 2017-12-08 LAB — COMPREHENSIVE METABOLIC PANEL
A/G RATIO: 1.6 (ref 1.2–2.2)
ALK PHOS: 73 IU/L (ref 39–117)
ALT: 17 IU/L (ref 0–32)
AST: 14 IU/L (ref 0–40)
Albumin: 4.6 g/dL (ref 3.5–4.8)
BILIRUBIN TOTAL: 0.3 mg/dL (ref 0.0–1.2)
BUN/Creatinine Ratio: 10 — ABNORMAL LOW (ref 12–28)
BUN: 13 mg/dL (ref 8–27)
CALCIUM: 10 mg/dL (ref 8.7–10.3)
CHLORIDE: 107 mmol/L — AB (ref 96–106)
CO2: 24 mmol/L (ref 20–29)
Creatinine, Ser: 1.28 mg/dL — ABNORMAL HIGH (ref 0.57–1.00)
GFR calc Af Amer: 46 mL/min/{1.73_m2} — ABNORMAL LOW (ref >59)
GFR, EST NON AFRICAN AMERICAN: 40 mL/min/{1.73_m2} — AB (ref >59)
GLOBULIN, TOTAL: 2.9 g/dL (ref 1.5–4.5)
Glucose: 141 mg/dL — ABNORMAL HIGH (ref 65–99)
POTASSIUM: 4.6 mmol/L (ref 3.5–5.2)
SODIUM: 146 mmol/L — AB (ref 134–144)
Total Protein: 7.5 g/dL (ref 6.0–8.5)

## 2017-12-08 LAB — URINE CULTURE: Organism ID, Bacteria: NO GROWTH

## 2017-12-08 LAB — CK: Total CK: 68 U/L (ref 24–173)

## 2017-12-09 ENCOUNTER — Encounter: Payer: Self-pay | Admitting: Family Medicine

## 2018-01-09 ENCOUNTER — Other Ambulatory Visit: Payer: Self-pay | Admitting: Family Medicine

## 2018-01-09 DIAGNOSIS — Z9109 Other allergy status, other than to drugs and biological substances: Secondary | ICD-10-CM

## 2018-01-19 ENCOUNTER — Emergency Department (HOSPITAL_COMMUNITY)
Admission: EM | Admit: 2018-01-19 | Discharge: 2018-01-19 | Disposition: A | Discharge status: Home / Self Care | Payer: Medicare Other | Attending: Emergency Medicine | Admitting: Emergency Medicine

## 2018-01-19 ENCOUNTER — Encounter (HOSPITAL_COMMUNITY): Payer: Self-pay | Admitting: Emergency Medicine

## 2018-01-19 ENCOUNTER — Emergency Department (HOSPITAL_COMMUNITY): Payer: Medicare Other

## 2018-01-19 DIAGNOSIS — E1122 Type 2 diabetes mellitus with diabetic chronic kidney disease: Secondary | ICD-10-CM | POA: Diagnosis not present

## 2018-01-19 DIAGNOSIS — Z85528 Personal history of other malignant neoplasm of kidney: Secondary | ICD-10-CM | POA: Insufficient documentation

## 2018-01-19 DIAGNOSIS — E039 Hypothyroidism, unspecified: Secondary | ICD-10-CM | POA: Diagnosis not present

## 2018-01-19 DIAGNOSIS — G8929 Other chronic pain: Secondary | ICD-10-CM

## 2018-01-19 DIAGNOSIS — R1013 Epigastric pain: Secondary | ICD-10-CM | POA: Diagnosis present

## 2018-01-19 DIAGNOSIS — Z7984 Long term (current) use of oral hypoglycemic drugs: Secondary | ICD-10-CM | POA: Diagnosis not present

## 2018-01-19 DIAGNOSIS — Z7982 Long term (current) use of aspirin: Secondary | ICD-10-CM | POA: Insufficient documentation

## 2018-01-19 DIAGNOSIS — I129 Hypertensive chronic kidney disease with stage 1 through stage 4 chronic kidney disease, or unspecified chronic kidney disease: Secondary | ICD-10-CM | POA: Insufficient documentation

## 2018-01-19 DIAGNOSIS — F17210 Nicotine dependence, cigarettes, uncomplicated: Secondary | ICD-10-CM | POA: Diagnosis not present

## 2018-01-19 DIAGNOSIS — N183 Chronic kidney disease, stage 3 (moderate): Secondary | ICD-10-CM | POA: Diagnosis not present

## 2018-01-19 DIAGNOSIS — R1084 Generalized abdominal pain: Secondary | ICD-10-CM | POA: Diagnosis not present

## 2018-01-19 DIAGNOSIS — Z79899 Other long term (current) drug therapy: Secondary | ICD-10-CM | POA: Diagnosis not present

## 2018-01-19 DIAGNOSIS — R109 Unspecified abdominal pain: Secondary | ICD-10-CM

## 2018-01-19 LAB — I-STAT TROPONIN, ED: Troponin i, poc: 0 ng/mL (ref 0.00–0.08)

## 2018-01-19 LAB — CBC
HCT: 40.2 % (ref 36.0–46.0)
Hemoglobin: 13.2 g/dL (ref 12.0–15.0)
MCH: 32.4 pg (ref 26.0–34.0)
MCHC: 32.8 g/dL (ref 30.0–36.0)
MCV: 98.8 fL (ref 78.0–100.0)
Platelets: 214 10*3/uL (ref 150–400)
RBC: 4.07 MIL/uL (ref 3.87–5.11)
RDW: 14.5 % (ref 11.5–15.5)
WBC: 6.4 10*3/uL (ref 4.0–10.5)

## 2018-01-19 LAB — URINALYSIS, ROUTINE W REFLEX MICROSCOPIC
BILIRUBIN URINE: NEGATIVE
Glucose, UA: NEGATIVE mg/dL
Hgb urine dipstick: NEGATIVE
KETONES UR: NEGATIVE mg/dL
LEUKOCYTES UA: NEGATIVE
NITRITE: NEGATIVE
PROTEIN: NEGATIVE mg/dL
Specific Gravity, Urine: 1.02 (ref 1.005–1.030)
pH: 5 (ref 5.0–8.0)

## 2018-01-19 LAB — COMPREHENSIVE METABOLIC PANEL
ALBUMIN: 4 g/dL (ref 3.5–5.0)
ALT: 12 U/L — ABNORMAL LOW (ref 14–54)
ANION GAP: 11 (ref 5–15)
AST: 25 U/L (ref 15–41)
Alkaline Phosphatase: 61 U/L (ref 38–126)
BUN: 10 mg/dL (ref 6–20)
CALCIUM: 9.7 mg/dL (ref 8.9–10.3)
CHLORIDE: 105 mmol/L (ref 101–111)
CO2: 26 mmol/L (ref 22–32)
Creatinine, Ser: 1.36 mg/dL — ABNORMAL HIGH (ref 0.44–1.00)
GFR calc Af Amer: 42 mL/min — ABNORMAL LOW (ref >60)
GFR calc non Af Amer: 36 mL/min — ABNORMAL LOW (ref >60)
Glucose, Bld: 132 mg/dL — ABNORMAL HIGH (ref 65–99)
POTASSIUM: 5.5 mmol/L — AB (ref 3.5–5.1)
SODIUM: 142 mmol/L (ref 135–145)
Total Bilirubin: 1.2 mg/dL (ref 0.3–1.2)
Total Protein: 7.1 g/dL (ref 6.5–8.1)

## 2018-01-19 LAB — LIPASE, BLOOD: LIPASE: 33 U/L (ref 11–51)

## 2018-01-19 MED ORDER — IOPAMIDOL (ISOVUE-300) INJECTION 61%
INTRAVENOUS | Status: AC
  Administered 2018-01-19: 75 mL
  Filled 2018-01-19: qty 75

## 2018-01-19 MED ORDER — MORPHINE SULFATE (PF) 4 MG/ML IV SOLN
4.0000 mg | Freq: Once | INTRAVENOUS | Status: AC
  Administered 2018-01-19: 4 mg via INTRAVENOUS
  Filled 2018-01-19: qty 1

## 2018-01-19 MED ORDER — SODIUM CHLORIDE 0.9 % IV BOLUS (SEPSIS)
500.0000 mL | Freq: Once | INTRAVENOUS | Status: AC
  Administered 2018-01-19: 500 mL via INTRAVENOUS

## 2018-01-19 MED ORDER — ONDANSETRON HCL 4 MG/2ML IJ SOLN
4.0000 mg | Freq: Once | INTRAMUSCULAR | Status: AC
  Administered 2018-01-19: 4 mg via INTRAVENOUS
  Filled 2018-01-19: qty 2

## 2018-01-19 NOTE — ED Triage Notes (Signed)
Pt to ER for evaluation of generalized abdominal pain x3-4 weeks, states pain with urination. States has one kidney, had her gallbladder removed in May of last year. Reports nausea without vomiting. Denies diarrhea. Pt in NAD. A/o x4.

## 2018-01-19 NOTE — ED Notes (Addendum)
Spoke with main lab concerning CMP and Lipase results - Lab stated results were still in process

## 2018-01-19 NOTE — ED Provider Notes (Signed)
[facility] EMERGENCY DEPARTMENT Provider Note   CSN: [id number] Arrival date & time: 01/19/18  0756     History   Chief Complaint Chief Complaint  Patient presents with  . Abdominal Pain    HPI Sherri Burns is a 82 y.o. female with history of GERD, appendectomy, right nephrectomy is here for evaluation of "all over abdominal pain" since yesterday associated with nausea. States she's had abdominal pain for several months but today the pain is worse. Pain is worst at the epigastric and diffuse lower abdomen, described as constant and sharp, radiates to her entire back. Also reports burning with urination and more frequency that is also been there for several months. She denies fevers vomiting, chest pain, shortness of breath, cough, diarrhea, melena, hematochezia. Last bowel movement yesterday was normal. Has been seen in the emergency department for similar abdominal pain and has been evaluated by primary care provider as well, states she was told to follow up with her gastroenterologist but she has not yet. She does not take ibuprofen, drink alcohol.History of appendectomy and hysterectomy. Last colonoscopy 09/2016 showed diverticulosis. No previous history of diverticulitis.  HPI  Past Medical History:  Diagnosis Date  . Abnormal EKG 02/07/2016   Inferolateral T wave inversion and ST depression.  . Acute calculous cholecystitis   . Anxiety   . Arthritis   . Colon polyps 11/2008   tubular adenoma  . Diabetes mellitus   . Esophageal problem    esophageal dilation  . GERD (gastroesophageal reflux disease)    + hpylori  . Headache   . Hyperlipidemia   . Hypertension   . Hypothyroidism   . Ischemic colitis ([place])   . Renal cancer, right ([place]) 2010   s/p Rt nephrectomy by Dr. Rosana Hoes  . Renal insufficiency     Patient Active Problem List   Diagnosis Date Noted  . Hyperlipidemia   . Abnormal EKG 02/07/2016  . Type 2 diabetes mellitus with diabetic  polyneuropathy, without long-term current use of insulin ([place]) 09/23/2012  . Essential hypertension   . Hypothyroidism   . CKD (chronic kidney disease), stage III [facility])     Past Surgical History:  Procedure Laterality Date  . ABDOMINAL HYSTERECTOMY  1978   partial  . CHOLECYSTECTOMY N/A 04/04/2016   Procedure: LAPAROSCOPIC CHOLECYSTECTOMY;  Surgeon: Autumn Messing III, MD;  Location: [facility];  Service: General;  Laterality: N/A;  . COLONOSCOPY    . ESOPHAGEAL DILATION  2016  . HERNIA REPAIR    . LAPAROSCOPIC CHOLECYSTECTOMY  04/04/2016  . NEPHRECTOMY Right 2010   Dr. Rosana Hoes, urology, due to renal cancer    OB History    No data available       Home Medications    Prior to Admission medications   Medication Sig Start Date End Date Taking? Authorizing Provider  acetaminophen (TYLENOL) 500 MG tablet Take 500 mg by mouth every 6 (six) hours as needed for mild pain.    [provider]  aspirin EC 81 MG tablet Take 81 mg by mouth daily.    [provider]  azithromycin (ZITHROMAX) 250 MG tablet Two tablets daily x 1 day then one tablet daily x 4 days 12/07/17   Wardell Honour, MD  cholecalciferol (VITAMIN D) 1000 units tablet Take 1,000 Units by mouth daily.    [provider]  clonazePAM (KLONOPIN) 1 MG tablet 1/2 tab po qam, 1/2 tab po qlunch, and 1 tab po qhs. 09/26/17   Delman Cheadle  N, MD  diltiazem (CARDIZEM CD) 360 MG 24 hr capsule Take 1 capsule (360 mg total) by mouth daily. 09/26/17   Shawnee Knapp, MD  fluticasone [facility]) 50 MCG/ACT nasal spray PLACE 1 SPRAY INTO BOTH NOSTRILS DAILY 01/10/18   Shawnee Knapp, MD  furosemide (LASIX) 40 MG tablet Take 1 tablet (40 mg total) by mouth daily. 09/26/17   Shawnee Knapp, MD  glipiZIDE (GLUCOTROL) 10 MG tablet TAKE TWO TABLETS (20 MG TOTAL) BY MOUTH TWO (TWO) TIMES DAILY BEFORE A MEAL. [place] 09/26/17   Shawnee Knapp, MD  levothyroxine (SYNTHROID, LEVOTHROID) 88 MCG tablet Take 1 tablet (88 mcg total) by mouth daily  before breakfast. 09/27/17   Shawnee Knapp, MD  losartan (COZAAR) 25 MG tablet Take 1 tablet (25 mg total) by mouth daily. 09/26/17   Shawnee Knapp, MD  metFORMIN (GLUCOPHAGE) 500 MG tablet Take 1 tablet (500 mg total) by mouth 2 (two) times daily with a meal. 09/26/17   Shawnee Knapp, MD  metoprolol tartrate (LOPRESSOR) 100 MG tablet Take 1 tablet (100 mg total) by mouth 2 (two) times daily. 09/26/17   Shawnee Knapp, MD  omeprazole (PRILOSEC) 40 MG capsule Take 1 capsule (40 mg total) by mouth daily. 09/26/17   Shawnee Knapp, MD  ondansetron (ZOFRAN) 4 MG tablet Take 1 tablet (4 mg total) by mouth every 6 (six) hours. 01/29/17   Jeffery, Chelle, PA-C  polyethylene glycol (MIRALAX / GLYCOLAX) packet Take 8.5 g by mouth every morning.     [provider]  vitamin E 100 UNIT capsule Take 100 Units by mouth daily.    [provider]    Family History Family History  Problem Relation Age of Onset  . Cancer Mother        colon, kidney cancer  . Cancer Father        throat cancer  . Cancer Sister   . Heart attack Brother     Social History Social History   Tobacco Use  . Smoking status: Current Some Day Smoker    Packs/day: 0.25    Years: 55.00    Pack years: 13.75    Types: Cigarettes  . Smokeless tobacco: Never Used  . Tobacco comment: cut back amount still  trying to quit  Substance Use Topics  . Alcohol use: No    Alcohol/week: 0.0 oz  . Drug use: No     Allergies   Codeine; Ciprocin-fluocin-procin [fluocinolone acetonide]; Clonidine derivatives; Crestor [rosuvastatin calcium]; Penicillins; Simvastatin; and Tessalon perles   Review of Systems Review of Systems  Gastrointestinal: Positive for abdominal pain.  Genitourinary: Positive for difficulty urinating.  Musculoskeletal: Positive for back pain.  All other systems reviewed and are negative.    Physical Exam Updated Vital Signs BP (!) 197/93   Pulse 65   Temp 98.2 F (36.8 C) (Oral)   Resp 15   Ht 5' 6.5"  (1.689 m)   Wt 78.9 kg (174 lb)   SpO2 93%   BMI 27.66 kg/m   Physical Exam  Constitutional: She is oriented to person, place, and time. She appears well-developed and well-nourished. No distress.  Non toxic  HENT:  Head: Normocephalic and atraumatic.  Nose: Nose normal.  Mouth/Throat: No oropharyngeal exudate.  Moist mucous membranes   Eyes: Conjunctivae and EOM are normal. Pupils are equal, round, and reactive to light.  Neck: Normal range of motion.  Cardiovascular: Normal rate, regular rhythm and intact distal pulses.  No murmur heard. 2+ DP and radial pulses bilaterally. No LE edema.   Pulmonary/Chest: Effort normal and breath sounds normal. No respiratory distress. She has no wheezes. She has no rales.  Abdominal: Soft. Bowel sounds are normal. There is tenderness in the right lower quadrant, epigastric area, periumbilical area, suprapubic area and left lower quadrant.  No G/R/R. No CVA tenderness. No pulsatility.   Musculoskeletal: Normal range of motion. She exhibits no deformity.  No midline TL spine tenderness or step-offs. Transfers in and out of bed and sits up in bed without difficulty.  Neurological: She is alert and oriented to person, place, and time.  Skin: Skin is warm and dry. Capillary refill takes less than 2 seconds.  Psychiatric: She has a normal mood and affect. Her behavior is normal. Judgment and thought content normal.  Nursing note and vitals reviewed.    ED Treatments / Results  Labs (all labs ordered are listed, but only abnormal results are displayed) Labs Reviewed  URINALYSIS, ROUTINE W REFLEX MICROSCOPIC - Abnormal; Notable for the following components:      Result Value   APPearance HAZY (*)    All other components within normal limits  COMPREHENSIVE METABOLIC PANEL - Abnormal; Notable for the following components:   Potassium 5.5 (*)    Glucose, Bld 132 (*)    Creatinine, Ser 1.36 (*)    ALT 12 (*)    GFR calc non Af Amer 36 (*)    GFR  calc Af Amer 42 (*)    All other components within normal limits  CBC  LIPASE, BLOOD  I-STAT TROPONIN, ED    EKG  EKG Interpretation  Date/Time:  Sunday January 19 2018 11:34:12 EST Ventricular Rate:  57 PR Interval:    QRS Duration: 107 QT Interval:  414 QTC Calculation: 404 R Axis:   -8 Text Interpretation:  Sinus rhythm Prolonged PR interval Non-specific ST-t changes No significant change since last tracing Confirmed by Lajean Saver [phone]) on 01/19/2018 11:39:29 AM       Radiology Ct Abdomen Pelvis W Contrast  Result Date: 01/19/2018 CLINICAL DATA:  Pt c/o generalized abdominal pain for 3-4 weeks, also has pain with urination. Feels pain in vagina and in her back. Has had Hyster in 1978 She has one kidney, had her gallbladder removed in May of last year. C/o nausea without vomiting. Denies diarrhea EXAM: CT ABDOMEN AND PELVIS WITH CONTRAST TECHNIQUE: Multidetector CT imaging of the abdomen and pelvis was performed using the standard protocol following bolus administration of intravenous contrast. CONTRAST:  63mL ISOVUE-300 IOPAMIDOL (ISOVUE-300) INJECTION 61% COMPARISON:  08/02/2017 FINDINGS: Lower chest: No acute abnormality. Hepatobiliary: No focal liver abnormality is seen. Status post cholecystectomy. No biliary dilatation. Pancreas: Unremarkable. No pancreatic ductal dilatation or surrounding inflammatory changes. Spleen: Normal in size without focal abnormality. Adrenals/Urinary Tract: Status post right nephrectomy. No adrenal masses. Left kidney normal in size, orientation and position with homogeneous enhancement and normal excretion. No left renal masses, stones or hydronephrosis. Normal left ureter. Bladder is unremarkable. Stomach/Bowel: Stomach and small bowel are within normal limits. Colon is normal in caliber. There are left colon diverticula without evidence of diverticulitis. Colon otherwise unremarkable. Appendix not visualized. Vascular/Lymphatic: Aortic  atherosclerosis. No enlarged abdominal or pelvic lymph nodes. Reproductive: Status post hysterectomy. No adnexal masses. Other: No ascites. Musculoskeletal: No fracture or acute finding. No osteoblastic or osteolytic lesions. IMPRESSION: 1. No acute findings within the abdomen or pelvis. 2. Status post cholecystectomy, right nephrectomy and hysterectomy. 3. Left colon  diverticula.  No diverticulitis. 4. Aortic atherosclerosis. Electronically Signed   By: Lajean Manes M.D.   On: 01/19/2018 13:20    Procedures Procedures (including critical care time)  Medications Ordered in ED Medications  morphine 4 MG/ML injection 4 mg (4 mg Intravenous Given 01/19/18 1211)  ondansetron (ZOFRAN) injection 4 mg (4 mg Intravenous Given 01/19/18 1212)  sodium chloride 0.9 % bolus 500 mL (500 mLs Intravenous New Bag/Given 01/19/18 1309)  iopamidol (ISOVUE-300) 61 % injection (75 mLs  Contrast Given 01/19/18 1244)     Initial Impression / Assessment and Plan / ED Course  I have reviewed the triage vital signs and the nursing notes.  Pertinent labs & imaging results that were available during my care of the patient were reviewed by me and considered in my medical decision making (see chart for details).  Clinical Course as of Jan 20 1336  Sun Jan 19, 2018  1205 Delay in lab, called, they will re-run BMP and lipase   [CG]  1216 Creatinine: (!) 1.36 [CG]  1216 GFR, Est African American: (!) 42 [CG]    Clinical Course User Index [CG] Kinnie Feil, PA-C   82 year old female here for acute on chronic abdominal pain. No systemic symptoms of infection, vomiting, diarrhea, constipation, melena or hematochezia. She does report urinary symptoms that also appear to be chronic. On exam she has focal tenderness to epigastric and right lower quadrant. Concern for appendicitis or right-sided diverticulitis in setting of chronic abdominal pain. Given age, risk and focality of pain likely need CT AP. Awaiting labs. U/A  does not look infected.   Final Clinical Impressions(s) / ED Diagnoses   CT scan, labs, UA unremarkable. Re-evaluated pt who reports improvement in pain. No emesis or diarrhea in ED. Tolerating PO. Discussed benign work up and plan to discharge. Pt agreeable. Encouraged her to f/u with Dr. Thana Farr for re-evaluation in one week. Discussed return precautions. Pt discussed with Dr Ashok Cordia.  Final diagnoses:  Chronic abdominal pain    ED Discharge Orders    None       Arlean Hopping 01/19/18 1337    Lajean Saver, MD 01/19/18 [id number]

## 2018-01-19 NOTE — ED Notes (Signed)
ED Provider at bedside. 

## 2018-01-19 NOTE — ED Notes (Signed)
Pt stable, ambulatory, and verbalizes understanding of d/c instructions.  

## 2018-01-19 NOTE — ED Notes (Signed)
Pt tolerating fluids.   

## 2018-01-19 NOTE — Discharge Instructions (Signed)
Labs, urine and CT scan looked normal today.  The cause for your symptoms is unclear. Avoid ibuprofen, advil or aleve. Follow up with your gastroenterologist for further evaluation and treatment. Return for fevers, chills, vomiting, diarrhea, blood in stools.

## 2018-01-24 DIAGNOSIS — K922 Gastrointestinal hemorrhage, unspecified: Secondary | ICD-10-CM

## 2018-01-24 HISTORY — DX: Gastrointestinal hemorrhage, unspecified: K92.2

## 2018-02-18 ENCOUNTER — Other Ambulatory Visit: Payer: Self-pay | Admitting: Family Medicine

## 2018-02-18 NOTE — Telephone Encounter (Signed)
LOV: 09/26/17  Dr. Katharina Caper [facility]

## 2018-02-19 ENCOUNTER — Other Ambulatory Visit: Payer: Self-pay | Admitting: Family Medicine

## 2018-02-19 DIAGNOSIS — I10 Essential (primary) hypertension: Secondary | ICD-10-CM

## 2018-02-19 DIAGNOSIS — Z9109 Other allergy status, other than to drugs and biological substances: Secondary | ICD-10-CM

## 2018-02-19 DIAGNOSIS — R601 Generalized edema: Secondary | ICD-10-CM

## 2018-02-19 DIAGNOSIS — E038 Other specified hypothyroidism: Secondary | ICD-10-CM

## 2018-02-19 NOTE — Telephone Encounter (Signed)
Refilled lorazepam x 2 mos Needs f/u OV with FASTING labs within the next 2 mos for any further lorazepam med refills and to review uncontrolled DM, HTN, HLD.  Please DO take her routine BP medications on the day of the appointment even though she will need to be fasting for labs but ok to NOT take her glipizide and metformin that morning since those do need to be taken 30 min before/with food (respectively).  [place] CSRS reviewed and appropriate.

## 2018-02-20 ENCOUNTER — Observation Stay (HOSPITAL_COMMUNITY)
Admission: EM | Admit: 2018-02-20 | Discharge: 2018-02-22 | Disposition: A | Discharge status: Home / Self Care | Payer: Medicare Other | Attending: Internal Medicine | Admitting: Internal Medicine

## 2018-02-20 ENCOUNTER — Encounter (HOSPITAL_COMMUNITY): Payer: Self-pay | Admitting: Emergency Medicine

## 2018-02-20 ENCOUNTER — Emergency Department (HOSPITAL_COMMUNITY): Payer: Medicare Other

## 2018-02-20 DIAGNOSIS — E1142 Type 2 diabetes mellitus with diabetic polyneuropathy: Secondary | ICD-10-CM | POA: Diagnosis not present

## 2018-02-20 DIAGNOSIS — R1032 Left lower quadrant pain: Secondary | ICD-10-CM

## 2018-02-20 DIAGNOSIS — K921 Melena: Secondary | ICD-10-CM | POA: Diagnosis present

## 2018-02-20 DIAGNOSIS — Z85528 Personal history of other malignant neoplasm of kidney: Secondary | ICD-10-CM | POA: Diagnosis not present

## 2018-02-20 DIAGNOSIS — E1122 Type 2 diabetes mellitus with diabetic chronic kidney disease: Secondary | ICD-10-CM | POA: Insufficient documentation

## 2018-02-20 DIAGNOSIS — N183 Chronic kidney disease, stage 3 unspecified: Secondary | ICD-10-CM | POA: Diagnosis present

## 2018-02-20 DIAGNOSIS — K922 Gastrointestinal hemorrhage, unspecified: Principal | ICD-10-CM | POA: Diagnosis present

## 2018-02-20 DIAGNOSIS — E039 Hypothyroidism, unspecified: Secondary | ICD-10-CM | POA: Diagnosis not present

## 2018-02-20 DIAGNOSIS — K5730 Diverticulosis of large intestine without perforation or abscess without bleeding: Secondary | ICD-10-CM | POA: Diagnosis not present

## 2018-02-20 DIAGNOSIS — E785 Hyperlipidemia, unspecified: Secondary | ICD-10-CM | POA: Diagnosis not present

## 2018-02-20 DIAGNOSIS — M1990 Unspecified osteoarthritis, unspecified site: Secondary | ICD-10-CM | POA: Insufficient documentation

## 2018-02-20 DIAGNOSIS — I5032 Chronic diastolic (congestive) heart failure: Secondary | ICD-10-CM | POA: Diagnosis not present

## 2018-02-20 DIAGNOSIS — Z888 Allergy status to other drugs, medicaments and biological substances status: Secondary | ICD-10-CM | POA: Insufficient documentation

## 2018-02-20 DIAGNOSIS — Z800 Family history of malignant neoplasm of digestive organs: Secondary | ICD-10-CM | POA: Insufficient documentation

## 2018-02-20 DIAGNOSIS — Z79899 Other long term (current) drug therapy: Secondary | ICD-10-CM | POA: Insufficient documentation

## 2018-02-20 DIAGNOSIS — Z8051 Family history of malignant neoplasm of kidney: Secondary | ICD-10-CM | POA: Insufficient documentation

## 2018-02-20 DIAGNOSIS — K219 Gastro-esophageal reflux disease without esophagitis: Secondary | ICD-10-CM | POA: Diagnosis not present

## 2018-02-20 DIAGNOSIS — Z880 Allergy status to penicillin: Secondary | ICD-10-CM | POA: Insufficient documentation

## 2018-02-20 DIAGNOSIS — Z885 Allergy status to narcotic agent status: Secondary | ICD-10-CM | POA: Insufficient documentation

## 2018-02-20 DIAGNOSIS — Z7984 Long term (current) use of oral hypoglycemic drugs: Secondary | ICD-10-CM | POA: Diagnosis not present

## 2018-02-20 DIAGNOSIS — Z90710 Acquired absence of both cervix and uterus: Secondary | ICD-10-CM | POA: Diagnosis not present

## 2018-02-20 DIAGNOSIS — Z9049 Acquired absence of other specified parts of digestive tract: Secondary | ICD-10-CM | POA: Insufficient documentation

## 2018-02-20 DIAGNOSIS — I130 Hypertensive heart and chronic kidney disease with heart failure and stage 1 through stage 4 chronic kidney disease, or unspecified chronic kidney disease: Secondary | ICD-10-CM | POA: Diagnosis not present

## 2018-02-20 DIAGNOSIS — Z7982 Long term (current) use of aspirin: Secondary | ICD-10-CM | POA: Diagnosis not present

## 2018-02-20 DIAGNOSIS — Z86010 Personal history of colonic polyps: Secondary | ICD-10-CM | POA: Insufficient documentation

## 2018-02-20 DIAGNOSIS — I700 Atherosclerosis of aorta: Secondary | ICD-10-CM | POA: Diagnosis not present

## 2018-02-20 DIAGNOSIS — K4090 Unilateral inguinal hernia, without obstruction or gangrene, not specified as recurrent: Secondary | ICD-10-CM | POA: Diagnosis not present

## 2018-02-20 DIAGNOSIS — F419 Anxiety disorder, unspecified: Secondary | ICD-10-CM | POA: Diagnosis not present

## 2018-02-20 DIAGNOSIS — Z8249 Family history of ischemic heart disease and other diseases of the circulatory system: Secondary | ICD-10-CM | POA: Insufficient documentation

## 2018-02-20 DIAGNOSIS — Z905 Acquired absence of kidney: Secondary | ICD-10-CM | POA: Diagnosis not present

## 2018-02-20 DIAGNOSIS — I10 Essential (primary) hypertension: Secondary | ICD-10-CM | POA: Diagnosis present

## 2018-02-20 HISTORY — DX: Chronic diastolic (congestive) heart failure: I50.32

## 2018-02-20 HISTORY — DX: Gastrointestinal hemorrhage, unspecified: K92.2

## 2018-02-20 LAB — COMPREHENSIVE METABOLIC PANEL
ALK PHOS: 69 U/L (ref 38–126)
ALT: 19 U/L (ref 14–54)
ANION GAP: 8 (ref 5–15)
AST: 28 U/L (ref 15–41)
Albumin: 4.1 g/dL (ref 3.5–5.0)
BILIRUBIN TOTAL: 1 mg/dL (ref 0.3–1.2)
BUN: 17 mg/dL (ref 6–20)
CALCIUM: 9.6 mg/dL (ref 8.9–10.3)
CO2: 27 mmol/L (ref 22–32)
Chloride: 104 mmol/L (ref 101–111)
Creatinine, Ser: 1.35 mg/dL — ABNORMAL HIGH (ref 0.44–1.00)
GFR, EST AFRICAN AMERICAN: 42 mL/min — AB (ref >60)
GFR, EST NON AFRICAN AMERICAN: 36 mL/min — AB (ref >60)
Glucose, Bld: 132 mg/dL — ABNORMAL HIGH (ref 65–99)
Potassium: 5 mmol/L (ref 3.5–5.1)
Sodium: 139 mmol/L (ref 135–145)
TOTAL PROTEIN: 7.8 g/dL (ref 6.5–8.1)

## 2018-02-20 LAB — CBC
HCT: 40.8 % (ref 36.0–46.0)
HEMOGLOBIN: 13.3 g/dL (ref 12.0–15.0)
MCH: 31.9 pg (ref 26.0–34.0)
MCHC: 32.6 g/dL (ref 30.0–36.0)
MCV: 97.8 fL (ref 78.0–100.0)
Platelets: 210 10*3/uL (ref 150–400)
RBC: 4.17 MIL/uL (ref 3.87–5.11)
RDW: 13.9 % (ref 11.5–15.5)
WBC: 7.3 10*3/uL (ref 4.0–10.5)

## 2018-02-20 LAB — TYPE AND SCREEN
ABO/RH(D): O POS
ANTIBODY SCREEN: NEGATIVE

## 2018-02-20 MED ORDER — ACETAMINOPHEN 500 MG PO TABS
1000.0000 mg | ORAL_TABLET | Freq: Once | ORAL | Status: DC
  Filled 2018-02-20: qty 2

## 2018-02-20 MED ORDER — IOPAMIDOL (ISOVUE-300) INJECTION 61%
INTRAVENOUS | Status: AC
  Administered 2018-02-20: 100 mL
  Filled 2018-02-20: qty 100

## 2018-02-20 NOTE — ED Triage Notes (Signed)
Patient to ED c/o GI bleeding onset today. Pt states she woke up with pain in R flank and when she went to the restroom, had "a lot of red blood just come out." Pt reports hx of diverticulitis and this happening before. Takes just a baby ASA each day. Denies N/V, no fevers/chills, but endorses generalized weakness. VSS in triage.

## 2018-02-20 NOTE — ED Provider Notes (Signed)
Assumed care from Dr. Thomasene Lot at shift change.  See prior notes for full H&P.  Briefly, 82 y.o. F with hx of ischemic colitis and tubular adenoma here with LLQ abdominal pain and blood in the stools. States large volume blood loss earlier today with associated lightheadedness. Has been followed by GI, Dr. Earlean Shawl in the past but has not had any recent follow-up.  Patient has gross blood on DRE, H/H stable currently.  Takes daily ASA.  Plan:  CT pending.  Will admit for observation as patient hesitant for discharge home.  Results for orders placed or performed during the hospital encounter of 02/20/18  Comprehensive metabolic panel  Result Value Ref Range   Sodium 139 135 - 145 mmol/L   Potassium 5.0 3.5 - 5.1 mmol/L   Chloride 104 101 - 111 mmol/L   CO2 27 22 - 32 mmol/L   Glucose, Bld 132 (H) 65 - 99 mg/dL   BUN 17 6 - 20 mg/dL   Creatinine, Ser 1.35 (H) 0.44 - 1.00 mg/dL   Calcium 9.6 8.9 - 10.3 mg/dL   Total Protein 7.8 6.5 - 8.1 g/dL   Albumin 4.1 3.5 - 5.0 g/dL   AST 28 15 - 41 U/L   ALT 19 14 - 54 U/L   Alkaline Phosphatase 69 38 - 126 U/L   Total Bilirubin 1.0 0.3 - 1.2 mg/dL   GFR calc non Af Amer 36 (L) >60 mL/min   GFR calc Af Amer 42 (L) >60 mL/min   Anion gap 8 5 - 15  CBC  Result Value Ref Range   WBC 7.3 4.0 - 10.5 K/uL   RBC 4.17 3.87 - 5.11 MIL/uL   Hemoglobin 13.3 12.0 - 15.0 g/dL   HCT 40.8 36.0 - 46.0 %   MCV 97.8 78.0 - 100.0 fL   MCH 31.9 26.0 - 34.0 pg   MCHC 32.6 30.0 - 36.0 g/dL   RDW 13.9 11.5 - 15.5 %   Platelets 210 150 - 400 K/uL  Type and screen [place]  Result Value Ref Range   ABO/RH(D) O POS    Antibody Screen NEG    Sample Expiration      02/23/2018 Performed at [facility], [address], [place], [place] [address]    Ct Abdomen Pelvis W Contrast  Result Date: 02/20/2018 CLINICAL DATA:  82 y/o F; abdominal pain with hematochezia that began today. EXAM: CT ABDOMEN AND PELVIS WITH CONTRAST TECHNIQUE:  Multidetector CT imaging of the abdomen and pelvis was performed using the standard protocol following bolus administration of intravenous contrast. CONTRAST:  75 cc ISOVUE-300 IOPAMIDOL (ISOVUE-300) INJECTION 61% COMPARISON:  01/19/2018 CT abdomen and pelvis. FINDINGS: Lower chest: No acute abnormality. Hepatobiliary: No focal liver abnormality is seen. Status post cholecystectomy. No biliary dilatation. Pancreas: Unremarkable. No pancreatic ductal dilatation or surrounding inflammatory changes. Spleen: Normal in size without focal abnormality. Adrenals/Urinary Tract: Status post right nephrectomy. Normal left adrenal gland. No focal abnormality of the left kidney mass. No hydronephrosis. Normal bladder. Stomach/Bowel: Stomach is within normal limits. Sigmoid diverticulosis without findings of acute diverticulitis. No evidence of bowel wall thickening, distention, or inflammatory changes. Stable small duodenum diverticulum arising from third segment of duodenum without inflammatory change. Normal appendix. Vascular/Lymphatic: Aortic atherosclerosis. No enlarged abdominal or pelvic lymph nodes. Reproductive: Status post hysterectomy. No adnexal masses. Other: Small left inguinal hernia containing fat. No abdominopelvic ascites. Musculoskeletal: No fracture is seen. Moderate lumbar spondylosis with grade 1 L4-5 anterolisthesis, multilevel discogenic  degenerative changes, and lower lumbar facet arthrosis. Sclerosis of bilateral sacroiliac joints compatible with osteoarthrosis. IMPRESSION: 1. No acute process identified. 2. Mild sigmoid diverticulosis. No findings of acute diverticulitis. 3. Additional stable chronic findings as above. Electronically Signed   By: Kristine Garbe M.D.   On: 02/20/2018 22:58    CT without acute findings.  Discussed with Dr. Myna Hidalgo-- he will admit for observation.  She will need OP GI follow-up.    Larene Pickett, PA-C 02/20/18 Linward Foster    Gareth Morgan, MD 02/21/18  1349

## 2018-02-20 NOTE — ED Notes (Signed)
Difficult iv start x 1

## 2018-02-20 NOTE — ED Provider Notes (Signed)
[facility] EMERGENCY DEPARTMENT Provider Note   CSN: [id number] Arrival date & time: 02/20/18  1719     History   Chief Complaint Chief Complaint  Patient presents with  . GI Bleeding    HPI Sherri Burns is a 82 y.o. female.  HPI  Patient is a 82 year old female presenting with GI bleeding.  Ho ischemic colitis and tubular adenoma. She reports that she has been having abdominal pain worse in the left lower quadrant.  Prior to arrival patient went to go to the bathroom and had large volume of blood.  She reports that "blood was flowing out and I could not stop it".  She reports she got all over the bathroom.  She reports she felt lightheaded afterwards.  Patient has had a history of diverticular bleed in the past.  Patient has seen Dr. Earlean Shawl in the past has not seen him in a number of years, and would like referral for new gastroenterologist.  Past Medical History:  Diagnosis Date  . Abnormal EKG 02/07/2016   Inferolateral T wave inversion and ST depression.  . Acute calculous cholecystitis   . Anxiety   . Arthritis   . Colon polyps 11/2008   tubular adenoma  . Diabetes mellitus   . Esophageal problem    esophageal dilation  . GERD (gastroesophageal reflux disease)    + hpylori  . Headache   . Hyperlipidemia   . Hypertension   . Hypothyroidism   . Ischemic colitis ([place])   . Renal cancer, right ([place]) 2010   s/p Rt nephrectomy by Dr. Rosana Hoes  . Renal insufficiency     Patient Active Problem List   Diagnosis Date Noted  . Hyperlipidemia   . Abnormal EKG 02/07/2016  . Type 2 diabetes mellitus with diabetic polyneuropathy, without long-term current use of insulin ([place]) 09/23/2012  . Essential hypertension   . Hypothyroidism   . CKD (chronic kidney disease), stage III [facility])     Past Surgical History:  Procedure Laterality Date  . ABDOMINAL HYSTERECTOMY  1978   partial  . CHOLECYSTECTOMY N/A 04/04/2016   Procedure: LAPAROSCOPIC  CHOLECYSTECTOMY;  Surgeon: Autumn Messing III, MD;  Location: [facility];  Service: General;  Laterality: N/A;  . COLONOSCOPY    . ESOPHAGEAL DILATION  2016  . HERNIA REPAIR    . LAPAROSCOPIC CHOLECYSTECTOMY  04/04/2016  . NEPHRECTOMY Right 2010   Dr. Rosana Hoes, urology, due to renal cancer     OB History   None      Home Medications    Prior to Admission medications   Medication Sig Start Date End Date Taking? Authorizing Provider  acetaminophen (TYLENOL) 500 MG tablet Take 500 mg by mouth every 6 (six) hours as needed for mild pain.    [provider]  aspirin EC 81 MG tablet Take 81 mg by mouth daily.    [provider]  azithromycin (ZITHROMAX) 250 MG tablet Two tablets daily x 1 day then one tablet daily x 4 days 12/07/17   Wardell Honour, MD  cholecalciferol (VITAMIN D) 1000 units tablet Take 1,000 Units by mouth daily.    [provider]  clonazePAM (KLONOPIN) 1 MG tablet TAKE 1/2 tab po qam AND 1/2 tab po qNOON TIME, THEN 1 tab po qhs. **NEED OFFICE VISIT FOR ADDITIONAL REFILLS** 02/19/18   Shawnee Knapp, MD  diltiazem (CARDIZEM CD) 360 MG 24 hr capsule Take 1 capsule (360 mg total) by mouth daily. 09/26/17   Delman Cheadle  N, MD  fluticasone (FLONASE) 50 MCG/ACT nasal spray PLACE 1 SPRAY INTO BOTH NOSTRILS DAILY 01/10/18   Shawnee Knapp, MD  furosemide (LASIX) 40 MG tablet Take 1 tablet (40 mg total) by mouth daily. 09/26/17   Shawnee Knapp, MD  glipiZIDE (GLUCOTROL) 10 MG tablet TAKE TWO TABLETS (20 MG TOTAL) BY MOUTH TWO (TWO) TIMES DAILY BEFORE A MEAL. [place] 09/26/17   Shawnee Knapp, MD  levothyroxine (SYNTHROID, LEVOTHROID) 88 MCG tablet Take 1 tablet (88 mcg total) by mouth daily before breakfast. 09/27/17   Shawnee Knapp, MD  losartan (COZAAR) 25 MG tablet Take 1 tablet (25 mg total) by mouth daily. 09/26/17   Shawnee Knapp, MD  metFORMIN (GLUCOPHAGE) 500 MG tablet Take 1 tablet (500 mg total) by mouth 2 (two) times daily with a meal. 09/26/17   Shawnee Knapp, MD  metoprolol  tartrate (LOPRESSOR) 100 MG tablet Take 1 tablet (100 mg total) by mouth 2 (two) times daily. 09/26/17   Shawnee Knapp, MD  omeprazole (PRILOSEC) 40 MG capsule Take 1 capsule (40 mg total) by mouth daily. 09/26/17   Shawnee Knapp, MD  ondansetron (ZOFRAN) 4 MG tablet Take 1 tablet (4 mg total) by mouth every 6 (six) hours. 01/29/17   Jeffery, Chelle, PA-C  polyethylene glycol (MIRALAX / GLYCOLAX) packet Take 8.5 g by mouth every morning.     [provider]  vitamin E 100 UNIT capsule Take 100 Units by mouth daily.    [provider]    Family History Family History  Problem Relation Age of Onset  . Cancer Mother        colon, kidney cancer  . Cancer Father        throat cancer  . Cancer Sister   . Heart attack Brother     Social History Social History   Tobacco Use  . Smoking status: Current Some Day Smoker    Packs/day: 0.25    Years: 55.00    Pack years: 13.75    Types: Cigarettes  . Smokeless tobacco: Never Used  . Tobacco comment: cut back amount still  trying to quit  Substance Use Topics  . Alcohol use: No    Alcohol/week: 0.0 oz  . Drug use: No     Allergies   Codeine; Ciprocin-fluocin-procin [fluocinolone acetonide]; Clonidine derivatives; Crestor [rosuvastatin calcium]; Penicillins; Simvastatin; and Tessalon perles   Review of Systems Review of Systems  Constitutional: Negative for activity change, fatigue and fever.  Respiratory: Negative for shortness of breath.   Cardiovascular: Negative for chest pain.  Gastrointestinal: Positive for abdominal pain and blood in stool. Negative for nausea and vomiting.  All other systems reviewed and are negative.    Physical Exam Updated Vital Signs BP 126/86 (BP Location: Left Arm)   Pulse 71   Temp 98.7 F (37.1 C)   Resp 16   SpO2 100%   Physical Exam  Constitutional: She is oriented to person, place, and time. She appears well-developed and well-nourished.  HENT:  Head: Normocephalic and  atraumatic.  Eyes: Right eye exhibits no discharge. Left eye exhibits no discharge.  Cardiovascular: Normal rate and regular rhythm.  Pulmonary/Chest: Effort normal and breath sounds normal. No respiratory distress.  Abdominal: Soft. There is tenderness.  Neurological: She is oriented to person, place, and time.  Skin: Skin is warm and dry. She is not diaphoretic.  Psychiatric: She has a normal mood and affect.  Nursing note and vitals reviewed.  ED Treatments / Results  Labs (all labs ordered are listed, but only abnormal results are displayed) Labs Reviewed  COMPREHENSIVE METABOLIC PANEL - Abnormal; Notable for the following components:      Result Value   Glucose, Bld 132 (*)    Creatinine, Ser 1.35 (*)    GFR calc non Af Amer 36 (*)    GFR calc Af Amer 42 (*)    All other components within normal limits  CBC  POC OCCULT BLOOD, ED  TYPE AND SCREEN    EKG None  Radiology No results found.  Procedures Procedures (including critical care time)  Medications Ordered in ED Medications - No data to display   Initial Impression / Assessment and Plan / ED Course  I have reviewed the triage vital signs and the nursing notes.  Pertinent labs & imaging results that were available during my care of the patient were reviewed by me and considered in my medical decision making (see chart for details).     Patient is a 82 year old female presenting with GI bleeding.  Ho ischemic colitis and tubular adenoma. She reports that she has been having abdominal pain worse in the left lower quadrant.  Prior to arrival patient went to go to the bathroom and had large volume of blood.  She reports that "blood was flowing out and I could not stop it".  She reports she got all over the bathroom.  She reports she felt lightheaded afterwards.  Patient has had a history of diverticular bleed in the past.  Patient has seen Dr. Earlean Shawl in the past has not seen him in a number of years, and would  like referral for new gastroenterologist  8:36 PM  Patient has a history of 1 kidney, hypertension hyperlipidemia.  Here with likely diverticular bleed.  Patient still having discomfort, blood in rectum. Ho ischemic colitis and tubular adenoma.  Will admit for obs overnight to make sure patient does not rebleed.  CT pending.    Final Clinical Impressions(s) / ED Diagnoses   Final diagnoses:  None    ED Discharge Orders    None       Macarthur Critchley, MD 02/27/18 [phone]

## 2018-02-21 ENCOUNTER — Other Ambulatory Visit: Payer: Self-pay

## 2018-02-21 ENCOUNTER — Encounter (HOSPITAL_COMMUNITY): Payer: Self-pay | Admitting: Family Medicine

## 2018-02-21 DIAGNOSIS — E1142 Type 2 diabetes mellitus with diabetic polyneuropathy: Secondary | ICD-10-CM | POA: Diagnosis not present

## 2018-02-21 DIAGNOSIS — E039 Hypothyroidism, unspecified: Secondary | ICD-10-CM | POA: Diagnosis not present

## 2018-02-21 DIAGNOSIS — N183 Chronic kidney disease, stage 3 (moderate): Secondary | ICD-10-CM | POA: Diagnosis not present

## 2018-02-21 DIAGNOSIS — K922 Gastrointestinal hemorrhage, unspecified: Secondary | ICD-10-CM | POA: Diagnosis not present

## 2018-02-21 DIAGNOSIS — I5032 Chronic diastolic (congestive) heart failure: Secondary | ICD-10-CM | POA: Diagnosis not present

## 2018-02-21 DIAGNOSIS — I10 Essential (primary) hypertension: Secondary | ICD-10-CM

## 2018-02-21 HISTORY — DX: Chronic diastolic (congestive) heart failure: I50.32

## 2018-02-21 LAB — BASIC METABOLIC PANEL
Anion gap: 10 (ref 5–15)
BUN: 17 mg/dL (ref 6–20)
CO2: 26 mmol/L (ref 22–32)
CREATININE: 1.35 mg/dL — AB (ref 0.44–1.00)
Calcium: 9.4 mg/dL (ref 8.9–10.3)
Chloride: 101 mmol/L (ref 101–111)
GFR, EST AFRICAN AMERICAN: 42 mL/min — AB (ref >60)
GFR, EST NON AFRICAN AMERICAN: 36 mL/min — AB (ref >60)
Glucose, Bld: 212 mg/dL — ABNORMAL HIGH (ref 65–99)
Potassium: 3.9 mmol/L (ref 3.5–5.1)
SODIUM: 137 mmol/L (ref 135–145)

## 2018-02-21 LAB — CBG MONITORING, ED
Glucose-Capillary: 84 mg/dL (ref 65–99)
Glucose-Capillary: 164 mg/dL — ABNORMAL HIGH (ref 65–99)
Glucose-Capillary: 96 mg/dL (ref 65–99)

## 2018-02-21 LAB — HEMOGLOBIN AND HEMATOCRIT, BLOOD
HCT: 37.8 % (ref 36.0–46.0)
HCT: 38.6 % (ref 36.0–46.0)
HEMOGLOBIN: 12.5 g/dL (ref 12.0–15.0)
Hemoglobin: 12.5 g/dL (ref 12.0–15.0)

## 2018-02-21 LAB — GLUCOSE, CAPILLARY
GLUCOSE-CAPILLARY: 150 mg/dL — AB (ref 65–99)
GLUCOSE-CAPILLARY: 153 mg/dL — AB (ref 65–99)

## 2018-02-21 MED ORDER — SODIUM CHLORIDE 0.9% FLUSH
3.0000 mL | Freq: Two times a day (BID) | INTRAVENOUS | Status: DC
  Administered 2018-02-21 – 2018-02-22 (×4): 3 mL via INTRAVENOUS

## 2018-02-21 MED ORDER — ONDANSETRON HCL 4 MG/2ML IJ SOLN
4.0000 mg | Freq: Four times a day (QID) | INTRAMUSCULAR | Status: DC | PRN
  Administered 2018-02-21: 4 mg via INTRAVENOUS
  Filled 2018-02-21: qty 2

## 2018-02-21 MED ORDER — ACETAMINOPHEN 325 MG PO TABS: 650.0000 mg | ORAL_TABLET | Freq: Four times a day (QID) | ORAL | Status: DC | PRN

## 2018-02-21 MED ORDER — VITAMIN D 1000 UNITS PO TABS
1000.0000 [IU] | ORAL_TABLET | Freq: Every day | ORAL | Status: DC
  Administered 2018-02-21 – 2018-02-22 (×2): 1000 [IU] via ORAL
  Filled 2018-02-21 (×2): qty 1

## 2018-02-21 MED ORDER — ONDANSETRON HCL 4 MG PO TABS: 4.0000 mg | ORAL_TABLET | Freq: Four times a day (QID) | ORAL | Status: DC | PRN

## 2018-02-21 MED ORDER — ACETAMINOPHEN 650 MG RE SUPP: 650.0000 mg | Freq: Four times a day (QID) | RECTAL | Status: DC | PRN

## 2018-02-21 MED ORDER — INSULIN ASPART 100 UNIT/ML ~~LOC~~ SOLN: 0.0000 [IU] | Freq: Every day | SUBCUTANEOUS | Status: DC

## 2018-02-21 MED ORDER — PANTOPRAZOLE SODIUM 40 MG IV SOLR
40.0000 mg | Freq: Every day | INTRAVENOUS | Status: DC
  Administered 2018-02-21 (×2): 40 mg via INTRAVENOUS
  Filled 2018-02-21 (×2): qty 40

## 2018-02-21 MED ORDER — HYDROCODONE-ACETAMINOPHEN 5-325 MG PO TABS
1.0000 | ORAL_TABLET | ORAL | Status: DC | PRN
  Administered 2018-02-21: 1 via ORAL
  Administered 2018-02-21: 2 via ORAL
  Filled 2018-02-21: qty 1
  Filled 2018-02-21: qty 2

## 2018-02-21 MED ORDER — METOPROLOL TARTRATE 100 MG PO TABS
100.0000 mg | ORAL_TABLET | Freq: Two times a day (BID) | ORAL | Status: DC
  Administered 2018-02-21 – 2018-02-22 (×3): 100 mg via ORAL
  Filled 2018-02-21 (×2): qty 1
  Filled 2018-02-21: qty 4

## 2018-02-21 MED ORDER — CLONAZEPAM 0.5 MG PO TABS
0.5000 mg | ORAL_TABLET | Freq: Three times a day (TID) | ORAL | Status: DC | PRN
  Administered 2018-02-22: 0.5 mg via ORAL
  Filled 2018-02-21: qty 1

## 2018-02-21 MED ORDER — DILTIAZEM HCL ER COATED BEADS 240 MG PO CP24
240.0000 mg | ORAL_CAPSULE | Freq: Every day | ORAL | Status: DC
  Administered 2018-02-21 – 2018-02-22 (×2): 240 mg via ORAL
  Filled 2018-02-21 (×2): qty 1

## 2018-02-21 MED ORDER — LEVOTHYROXINE SODIUM 88 MCG PO TABS
88.0000 ug | ORAL_TABLET | Freq: Every day | ORAL | Status: DC
  Administered 2018-02-22: 88 ug via ORAL
  Filled 2018-02-21 (×3): qty 1

## 2018-02-21 MED ORDER — INSULIN ASPART 100 UNIT/ML ~~LOC~~ SOLN
0.0000 [IU] | Freq: Three times a day (TID) | SUBCUTANEOUS | Status: DC
  Administered 2018-02-21: 2 [IU] via SUBCUTANEOUS
  Administered 2018-02-21 – 2018-02-22 (×2): 1 [IU] via SUBCUTANEOUS
  Filled 2018-02-21: qty 1

## 2018-02-21 NOTE — H&P (Signed)
History and Physical    Sherri Burns:[id number] DOB: 1935-12-17 DOA: 02/20/2018  PCP: Shawnee Knapp, MD   Patient coming from: Home  Chief Complaint: Rectal bleeding   HPI: Sherri Burns is a 82 y.o. female with medical history significant for chronic diastolic CHF, type 2 diabetes mellitus, hypertension, hypothyroidism, and anxiety, now presenting to the emergency department after an episode of rectal bleeding at home.  Patient reports that she been in her usual state of health until she developed a sudden urge to defecate, passed a small solid stool, but reports "filling the toilet with blood."  She describes the blood as bright red.  She had been experiencing some pain in the right flank which she has had before, but that had resolved earlier in the day.  She denies abdominal pain, nausea, or vomiting.  She reports some mild generalized weakness, but denies lightheadedness upon standing or chest pain.  Denies headache, change in vision or hearing, or focal numbness or weakness.  She reports having similar symptoms remotely, states that she previously followed with gastroenterology, but has not seen them in quite some time.  She takes a daily aspirin and a daily PPI.  Denies any NSAID or alcohol use.  ED Course: Upon arrival to the ED, patient is found to be afebrile, saturating well on room air, and with vitals otherwise normal.  Chemistry panel is notable for a stable creatinine at 1.35.  CBC is unremarkable.  CT of the abdomen and pelvis is negative for any acute intra-abdominal or pelvic pathology, but notable for mild sigmoid diverticulosis without inflammation.  Type and screen was performed in the emergency department, patient remained hemodynamically stable, and she will be observed on the telemetry unit for ongoing evaluation and management of acute GI bleed, most likely diverticular.  Review of Systems:  All other systems reviewed and apart from HPI, are negative.  Past  Medical History:  Diagnosis Date  . Abnormal EKG 02/07/2016   Inferolateral T wave inversion and ST depression.  . Acute calculous cholecystitis   . Anxiety   . Arthritis   . Colon polyps 11/2008   tubular adenoma  . Diabetes mellitus   . Esophageal problem    esophageal dilation  . GERD (gastroesophageal reflux disease)    + hpylori  . Headache   . Hyperlipidemia   . Hypertension   . Hypothyroidism   . Ischemic colitis ([place])   . Renal cancer, right ([place]) 2010   s/p Rt nephrectomy by Dr. Rosana Hoes  . Renal insufficiency     Past Surgical History:  Procedure Laterality Date  . ABDOMINAL HYSTERECTOMY  1978   partial  . CHOLECYSTECTOMY N/A 04/04/2016   Procedure: LAPAROSCOPIC CHOLECYSTECTOMY;  Surgeon: Autumn Messing III, MD;  Location: [facility];  Service: General;  Laterality: N/A;  . COLONOSCOPY    . ESOPHAGEAL DILATION  2016  . HERNIA REPAIR    . LAPAROSCOPIC CHOLECYSTECTOMY  04/04/2016  . NEPHRECTOMY Right 2010   Dr. Rosana Hoes, urology, due to renal cancer     reports that she has been smoking cigarettes.  She has a 13.75 pack-year smoking history. She has never used smokeless tobacco. She reports that she does not drink alcohol or use drugs.  Allergies  Allergen Reactions  . Codeine Other (See Comments)    hallucinations  . Metformin And Related     Upset stomach  . Ciprocin-Fluocin-Procin [Fluocinolone Acetonide] Other (See Comments)    Unknown reaction  . Clonidine Derivatives Other (See  Comments)    Dry mouth  . Crestor [Rosuvastatin Calcium] Other (See Comments)    cramps  . Penicillins Other (See Comments)    Has patient had a PCN reaction causing immediate rash, facial/tongue/throat swelling, SOB or lightheadedness with hypotension: no Has patient had a PCN reaction causing severe rash involving mucus membranes or skin necrosis: no Has patient had a PCN reaction that required hospitalization : no Has patient had a PCN reaction occurring within the last 10 years:  no -Hallucinates If all of the above answers are "NO", then may proceed with Cephalosporin use.   . Simvastatin Other (See Comments)    Upset stomach   . Tessalon Perles Other (See Comments)    Gi upset     Family History  Problem Relation Age of Onset  . Cancer Mother        colon, kidney cancer  . Cancer Father        throat cancer  . Cancer Sister   . Heart attack Brother      Prior to Admission medications   Medication Sig Start Date End Date Taking? Authorizing Provider  acetaminophen (TYLENOL) 500 MG tablet Take 500 mg by mouth every 6 (six) hours as needed for mild pain.   Yes [provider]  aspirin EC 81 MG tablet Take 81 mg by mouth daily.   Yes [provider]  cholecalciferol (VITAMIN D) 1000 units tablet Take 1,000 Units by mouth daily.   Yes [provider]  clonazePAM (KLONOPIN) 1 MG tablet TAKE 1/2 tab po qam AND 1/2 tab po qNOON TIME, THEN 1 tab po qhs. **NEED OFFICE VISIT FOR ADDITIONAL REFILLS** Patient taking differently: Take 0.5 mg by mouth 3 (three) times daily as needed. TAKE 0.5 mg  tab po qam AND 0.5 mg  tab po qNOON TIME, THEN 1mg   tab po qhs. **NEED OFFICE VISIT FOR ADDITIONAL REFILLS** 02/19/18  Yes Shawnee Knapp, MD  Colloidal Oatmeal (ECZEMA MOISTURIZING) 1 % LOTN Apply 1 application topically as needed. Eczema cream   Yes [provider]  diltiazem (CARDIZEM CD) 360 MG 24 hr capsule Take 1 capsule (360 mg total) by mouth daily. 09/26/17  Yes Shawnee Knapp, MD  fluticasone (FLONASE) 50 MCG/ACT nasal spray PLACE 1 SPRAY INTO BOTH NOSTRILS DAILY Patient taking differently: Place 1 spray into both nostrils at bedtime.  01/10/18  Yes Shawnee Knapp, MD  furosemide (LASIX) 40 MG tablet Take 1 tablet (40 mg total) by mouth daily. 09/26/17  Yes Shawnee Knapp, MD  glipiZIDE (GLUCOTROL) 10 MG tablet TAKE TWO TABLETS (20 MG TOTAL) BY MOUTH TWO (TWO) TIMES DAILY BEFORE A MEAL. [place] 09/26/17  Yes Shawnee Knapp, MD  levothyroxine  (SYNTHROID, LEVOTHROID) 88 MCG tablet Take 1 tablet (88 mcg total) by mouth daily before breakfast. 09/27/17  Yes Shawnee Knapp, MD  metoprolol tartrate (LOPRESSOR) 100 MG tablet Take 1 tablet (100 mg total) by mouth 2 (two) times daily. 09/26/17  Yes Shawnee Knapp, MD  neomycin-polymyxin-dexameth (MAXITROL) 0.1 % OINT 1 application as needed. In her hear when needed for itching   Yes [provider]  omeprazole (PRILOSEC) 40 MG capsule Take 1 capsule (40 mg total) by mouth daily. 09/26/17  Yes Shawnee Knapp, MD  polyethylene glycol [facility] / Floria Raveling) packet Take 8.5 g by mouth every morning.    Yes [provider]  vitamin E 100 UNIT capsule Take 100 Units by mouth daily.   Yes [provider]  metFORMIN (GLUCOPHAGE) 500 MG tablet Take 1 tablet (500 mg total) by mouth 2 (two) times daily with a meal. Patient not taking: Reported on 02/20/2018 09/26/17   Shawnee Knapp, MD    Physical Exam: Vitals:   02/20/18 2015 02/20/18 2030 02/20/18 2045 02/20/18 2245  BP: 134/84 128/71 128/74 (!) 154/75  Pulse: 63 60 (!) 59 (!) 59  Resp:      Temp:      SpO2: 98% 98% 99% 99%      Constitutional: NAD, calm  Eyes: PERTLA, lids and conjunctivae normal ENMT: Mucous membranes are moist. Posterior pharynx clear of any exudate or lesions.   Neck: normal, supple, no masses, no thyromegaly Respiratory: clear to auscultation bilaterally, no wheezing. No accessory muscle use.  Cardiovascular: S1 & S2 heard, regular rate and rhythm. No significant JVD. Abdomen: No distension, soft, soft non-tender superficial mass in LLQ. Bowel sounds active.  Musculoskeletal: no clubbing / cyanosis. No joint deformity upper and lower extremities.   Skin: no significant rashes, lesions, ulcers. Warm, dry, well-perfused. Neurologic: CN 2-12 grossly intact. Sensation intact. Strength 5/5 in all 4 limbs.  Psychiatric: Alert and oriented x 3. Calm, cooperative.     Labs on Admission: I have personally reviewed  following labs and imaging studies  CBC: Recent Labs  Lab 02/20/18 1829  WBC 7.3  HGB 13.3  HCT 40.8  MCV 97.8  PLT 338   Basic Metabolic Panel: Recent Labs  Lab 02/20/18 1829  NA 139  K 5.0  CL 104  CO2 27  GLUCOSE 132*  BUN 17  CREATININE 1.35*  CALCIUM 9.6   GFR: CrCl cannot be calculated (Unknown ideal weight.). Liver Function Tests: Recent Labs  Lab 02/20/18 1829  AST 28  ALT 19  ALKPHOS 69  BILITOT 1.0  PROT 7.8  ALBUMIN 4.1   No results for input(s): LIPASE, AMYLASE in the last 168 hours. No results for input(s): AMMONIA in the last 168 hours. Coagulation Profile: No results for input(s): INR, PROTIME in the last 168 hours. Cardiac Enzymes: No results for input(s): CKTOTAL, CKMB, CKMBINDEX, TROPONINI in the last 168 hours. BNP (last 3 results) No results for input(s): PROBNP in the last 8760 hours. HbA1C: No results for input(s): HGBA1C in the last 72 hours. CBG: No results for input(s): GLUCAP in the last 168 hours. Lipid Profile: No results for input(s): CHOL, HDL, LDLCALC, TRIG, CHOLHDL, LDLDIRECT in the last 72 hours. Thyroid Function Tests: No results for input(s): TSH, T4TOTAL, FREET4, T3FREE, THYROIDAB in the last 72 hours. Anemia Panel: No results for input(s): VITAMINB12, FOLATE, FERRITIN, TIBC, IRON, RETICCTPCT in the last 72 hours. Urine analysis:    Component Value Date/Time   COLORURINE YELLOW 01/19/2018 0835   APPEARANCEUR HAZY (A) 01/19/2018 0835   LABSPEC 1.020 01/19/2018 0835   PHURINE 5.0 01/19/2018 0835   GLUCOSEU NEGATIVE 01/19/2018 0835   HGBUR NEGATIVE 01/19/2018 0835   BILIRUBINUR NEGATIVE 01/19/2018 0835   BILIRUBINUR negative 12/07/2017 1647   BILIRUBINUR small 11/09/2014 1629   KETONESUR NEGATIVE 01/19/2018 0835   PROTEINUR NEGATIVE 01/19/2018 0835   UROBILINOGEN 0.2 12/07/2017 1647   UROBILINOGEN 0.2 04/28/2013 2321   NITRITE NEGATIVE 01/19/2018 0835   LEUKOCYTESUR NEGATIVE 01/19/2018 0835   Sepsis  Labs: @LABRCNTIP (procalcitonin:4,lacticidven:4) )No results found for this or any previous visit (from the past 240 hour(s)).   Radiological Exams on Admission: Ct Abdomen Pelvis W Contrast  Result Date: 02/20/2018 CLINICAL DATA:  82 y/o F; abdominal pain with hematochezia that  began today. EXAM: CT ABDOMEN AND PELVIS WITH CONTRAST TECHNIQUE: Multidetector CT imaging of the abdomen and pelvis was performed using the standard protocol following bolus administration of intravenous contrast. CONTRAST:  75 cc ISOVUE-300 IOPAMIDOL (ISOVUE-300) INJECTION 61% COMPARISON:  01/19/2018 CT abdomen and pelvis. FINDINGS: Lower chest: No acute abnormality. Hepatobiliary: No focal liver abnormality is seen. Status post cholecystectomy. No biliary dilatation. Pancreas: Unremarkable. No pancreatic ductal dilatation or surrounding inflammatory changes. Spleen: Normal in size without focal abnormality. Adrenals/Urinary Tract: Status post right nephrectomy. Normal left adrenal gland. No focal abnormality of the left kidney mass. No hydronephrosis. Normal bladder. Stomach/Bowel: Stomach is within normal limits. Sigmoid diverticulosis without findings of acute diverticulitis. No evidence of bowel wall thickening, distention, or inflammatory changes. Stable small duodenum diverticulum arising from third segment of duodenum without inflammatory change. Normal appendix. Vascular/Lymphatic: Aortic atherosclerosis. No enlarged abdominal or pelvic lymph nodes. Reproductive: Status post hysterectomy. No adnexal masses. Other: Small left inguinal hernia containing fat. No abdominopelvic ascites. Musculoskeletal: No fracture is seen. Moderate lumbar spondylosis with grade 1 L4-5 anterolisthesis, multilevel discogenic degenerative changes, and lower lumbar facet arthrosis. Sclerosis of bilateral sacroiliac joints compatible with osteoarthrosis. IMPRESSION: 1. No acute process identified. 2. Mild sigmoid diverticulosis. No findings of acute  diverticulitis. 3. Additional stable chronic findings as above. Electronically Signed   By: Kristine Garbe M.D.   On: 02/20/2018 22:58    EKG:  Not performed.   Assessment/Plan  1. Acute lower GI bleed  - Presents following an episode of rectal bleeding  - Description most suggestive of diverticular bleed, AVM possible, much less likely to be upper  - H&H are normal on arrival and there is no tachycardia or hypotension  - DRE grossly positive in ED  - Type and screen performed  - Follow serial H&H, continue supportive care, hold ASA    2. CKD stage III  - SCr is 1.35 on admission, consistent with her apparent baseline  - Renally-dose medications, avoid nephrotoxins    3. Hypertension  - BP at goal  - Continue diltiazem and metoprolol as tolerated    4. Hypothyroidism  - Continue Synthroid    5. Type II DM  - A1c as 8.0% in November '18  - Managed at home with glimepiride, held on admission  - Follow CBG's and use a SSI with Novolog while in hospital    6. Chronic diastolic CHF  - Appears euvolemic on admission  - Hold diuretics initially given large liquid BM's - Continue beta-blocker as tolerated    DVT prophylaxis: SCD's  Code Status: Full  Family Communication: Discussed with patient Consults called: None Admission status: Observation     Vianne Bulls, MD [facility] Pager [phone]  If 7PM-7AM, please contact night-coverage [email or web address] Password [facility]  02/21/2018, 12:04 AM

## 2018-02-21 NOTE — Progress Notes (Signed)
PROGRESS NOTE    Sherri Burns  KDT:[id number] DOB: 04-29-1936 DOA: 02/20/2018 PCP: Shawnee Knapp, MD    Brief Narrative:  82 year old female who presents with rectal bleeding. She does have the significant past medical history for chronic diastolic heart failure, type 2 diabetes mellitus, hypertension, hypothyroidism and anxiety. Patient noted bright red blood per rectum while moving her bowels, no significant abdominal pain, nausea or vomiting. On initial physical examination blood pressure 134/84, heart rate 63, xygen saturation 90%, moist mucous membranes, lungs clear to auscultation bilaterally, heart S1-S2 present rhythmic, abdomen soft, nontender, palpable mass in left lobe white, no lower extremity edema. . Sodium 139, potassium 5.0, para 4, bicarbonate 27, glucose 132, BUN 17, creatinine 1.35, white count 7.3, hemoglobin 13.3, hematocrit 40.8, platelets 210, CT of the abdomen with diverticulosis.   Patient was admitted to the hospital working diagnosis of lower GI bleed likely due to the diverticulosis.  Assessment & Plan:   Principal Problem:   Acute lower GI bleeding Active Problems:   Essential hypertension   Hypothyroidism   CKD (chronic kidney disease), stage III (HCC)   Type 2 diabetes mellitus with diabetic polyneuropathy, without long-term current use of insulin (HCC)   Chronic diastolic CHF (congestive heart failure) ([place])  1. Acute lower GI bleed. Will continue close follow on hb and hct, patient had colonoscopy in 2015, continue antiacid therapy with metoprolol. May not need endoscopic intervention on this admission. Advance diet as tolerated.   2. Chronic kidney disease stage III. Continue close follow up on renal function and electrolytes. Avoid hypotension and nephrotoxic medications.   3. Hypertension. Continue blood pressure monitoring, continue diltiazem and metoprolol.   4. Type 2 diabetes mellitus. Will continue glucose cover and monitoring with insulin  sliding scale.   5. Chronic diastolic heart failure. Stable with no signs of exacerbation. Will hold on asa for now due to bleeding.   6. Hypothyroid. Continue levothyroxine.     DVT prophylaxis: scd  Code Status:  full Family Communication: no family at the bedside Disposition Plan: home when clinically improved   Consultants:     Procedures:     Antimicrobials:       Subjective: Patient is feeling better, mild generalized dull abdominal pain, patient had hemorrhoids in the past, colonoscopy on 2015. No hematemesis.    Objective: Vitals:   02/21/18 0700 02/21/18 0800 02/21/18 0900 02/21/18 1100  BP: 125/77 (!) 115/57 127/75 128/76  Pulse: 66 63 62 61  Resp: 17 12 16 14   Temp:      TempSrc:      SpO2: 96% 95% 92% 93%   No intake or output data in the 24 hours ending 02/21/18 1213 There were no vitals filed for this visit.  Examination:   General: Not in pain or dyspnea, deconditioned Neurology: Awake and alert, non focal  E ENT: mild pallor, no icterus, oral mucosa moist Cardiovascular: No JVD. S1-S2 present, rhythmic, no gallops, rubs, or murmurs. No lower extremity edema. Pulmonary: vesicular breath sounds bilaterally, adequate air movement, no wheezing, rhonchi or rales. Gastrointestinal. Abdomen protuberant, no organomegaly, non tender, no rebound or guarding Skin. No rashes Musculoskeletal: no joint deformities     Data Reviewed: I have personally reviewed following labs and imaging studies  CBC: Recent Labs  Lab 02/20/18 1829 02/21/18 0052  WBC 7.3  --   HGB 13.3 12.5  HCT 40.8 37.8  MCV 97.8  --   PLT 210  --    Basic Metabolic Panel: Recent  Labs  Lab 02/20/18 1829 02/21/18 0358  NA 139 137  K 5.0 3.9  CL 104 101  CO2 27 26  GLUCOSE 132* 212*  BUN 17 17  CREATININE 1.35* 1.35*  CALCIUM 9.6 9.4   GFR: CrCl cannot be calculated (Unknown ideal weight.). Liver Function Tests: Recent Labs  Lab 02/20/18 1829  AST 28  ALT 19    ALKPHOS 69  BILITOT 1.0  PROT 7.8  ALBUMIN 4.1   No results for input(s): LIPASE, AMYLASE in the last 168 hours. No results for input(s): AMMONIA in the last 168 hours. Coagulation Profile: No results for input(s): INR, PROTIME in the last 168 hours. Cardiac Enzymes: No results for input(s): CKTOTAL, CKMB, CKMBINDEX, TROPONINI in the last 168 hours. BNP (last 3 results) No results for input(s): PROBNP in the last 8760 hours. HbA1C: No results for input(s): HGBA1C in the last 72 hours. CBG: Recent Labs  Lab 02/21/18 0107 02/21/18 0808  GLUCAP 84 164*   Lipid Profile: No results for input(s): CHOL, HDL, LDLCALC, TRIG, CHOLHDL, LDLDIRECT in the last 72 hours. Thyroid Function Tests: No results for input(s): TSH, T4TOTAL, FREET4, T3FREE, THYROIDAB in the last 72 hours. Anemia Panel: No results for input(s): VITAMINB12, FOLATE, FERRITIN, TIBC, IRON, RETICCTPCT in the last 72 hours.    Radiology Studies: I have reviewed all of the imaging during this hospital visit personally     Scheduled Meds: . acetaminophen  1,000 mg Oral Once  . cholecalciferol  1,000 Units Oral Daily  . diltiazem  240 mg Oral Daily  . insulin aspart  0-5 Units Subcutaneous QHS  . insulin aspart  0-9 Units Subcutaneous TID WC  . levothyroxine  88 mcg Oral QAC breakfast  . metoprolol tartrate  100 mg Oral BID  . pantoprazole (PROTONIX) IV  40 mg Intravenous QHS  . sodium chloride flush  3 mL Intravenous Q12H   Continuous Infusions:   LOS: 0 days        Rolin Schult Gerome Apley, MD [facility] Pager [phone]

## 2018-02-21 NOTE — ED Notes (Signed)
Notified pharmacy for synthroid

## 2018-02-21 NOTE — ED Notes (Signed)
Admitting MD at bedside.

## 2018-02-21 NOTE — Progress Notes (Signed)
Patient trasfered from ED to 5W07 via stretcher; alert and oriented x 4; complaints of abdominal pain (6/10); IV saline locked in LAC; skin intact. Orient patient to room and unit; gave patient care guide; instructed how to use the call bell and  fall risk precautions; tele box 17 placed per MD order. Will continue to monitor the patient.

## 2018-02-21 NOTE — Progress Notes (Signed)
During shift assessment, pt endorsed pain associated with urination that has been going on "since yesterday."  States that it is a pain in her abdomen "moves down to my vagina and burns when I pee."  Paged [facility] provider Jeannette Corpus, who ordered a urine culture.  Will continue to monitor.

## 2018-02-22 DIAGNOSIS — N183 Chronic kidney disease, stage 3 (moderate): Secondary | ICD-10-CM | POA: Diagnosis not present

## 2018-02-22 DIAGNOSIS — I10 Essential (primary) hypertension: Secondary | ICD-10-CM | POA: Diagnosis not present

## 2018-02-22 DIAGNOSIS — E039 Hypothyroidism, unspecified: Secondary | ICD-10-CM | POA: Diagnosis not present

## 2018-02-22 DIAGNOSIS — K922 Gastrointestinal hemorrhage, unspecified: Secondary | ICD-10-CM | POA: Diagnosis not present

## 2018-02-22 LAB — CBC WITH DIFFERENTIAL/PLATELET
BASOS ABS: 0 10*3/uL (ref 0.0–0.1)
Basophils Relative: 1 %
EOS PCT: 4 %
Eosinophils Absolute: 0.3 10*3/uL (ref 0.0–0.7)
HEMATOCRIT: 40.1 % (ref 36.0–46.0)
Hemoglobin: 13.2 g/dL (ref 12.0–15.0)
LYMPHS ABS: 2.7 10*3/uL (ref 0.7–4.0)
LYMPHS PCT: 41 %
MCH: 32.4 pg (ref 26.0–34.0)
MCHC: 32.9 g/dL (ref 30.0–36.0)
MCV: 98.5 fL (ref 78.0–100.0)
MONO ABS: 0.3 10*3/uL (ref 0.1–1.0)
MONOS PCT: 5 %
Neutro Abs: 3.3 10*3/uL (ref 1.7–7.7)
Neutrophils Relative %: 49 %
PLATELETS: 176 10*3/uL (ref 150–400)
RBC: 4.07 MIL/uL (ref 3.87–5.11)
RDW: 14.4 % (ref 11.5–15.5)
WBC: 6.6 10*3/uL (ref 4.0–10.5)

## 2018-02-22 LAB — BASIC METABOLIC PANEL
Anion gap: 12 (ref 5–15)
BUN: 12 mg/dL (ref 6–20)
CALCIUM: 9.6 mg/dL (ref 8.9–10.3)
CO2: 25 mmol/L (ref 22–32)
CREATININE: 1.2 mg/dL — AB (ref 0.44–1.00)
Chloride: 103 mmol/L (ref 101–111)
GFR calc Af Amer: 48 mL/min — ABNORMAL LOW (ref >60)
GFR, EST NON AFRICAN AMERICAN: 42 mL/min — AB (ref >60)
GLUCOSE: 104 mg/dL — AB (ref 65–99)
Potassium: 4.6 mmol/L (ref 3.5–5.1)
Sodium: 140 mmol/L (ref 135–145)

## 2018-02-22 LAB — GLUCOSE, CAPILLARY
Glucose-Capillary: 133 mg/dL — ABNORMAL HIGH (ref 65–99)
Glucose-Capillary: 131 mg/dL — ABNORMAL HIGH (ref 65–99)

## 2018-02-22 MED ORDER — PANTOPRAZOLE SODIUM 40 MG PO TBEC
40.0000 mg | DELAYED_RELEASE_TABLET | Freq: Every day | ORAL | Status: DC
Start: 2018-02-22 — End: 2018-02-22

## 2018-02-22 MED ORDER — POLYETHYLENE GLYCOL 3350 17 G PO PACK
17.0000 g | PACK | Freq: Every day | ORAL | Status: DC
  Filled 2018-02-22: qty 1

## 2018-02-22 NOTE — Progress Notes (Signed)
Pt given discharge instructions, prescriptions, and care notes. Pt verbalized understanding AEB no further questions or concerns at this time. IV was discontinued, no redness, pain, or swelling noted at this time. Telemetry discontinued and Centralized Telemetry was notified. Pt left the floor via wheelchair with staff in stable condition. 

## 2018-02-22 NOTE — Care Management Obs Status (Signed)
[facility] NOTIFICATION   Patient Details  Name: Sherri Burns MRN: [id number] Date of Birth: 06-20-1936   Medicare Observation Status Notification Given:  Yes    Carles Collet, RN 02/22/2018, 10:17 AM

## 2018-02-22 NOTE — Discharge Summary (Addendum)
Physician Discharge Summary  Sherri Burns UXN:[id number] DOB: 09/09/1936 DOA: 02/20/2018  PCP: Shawnee Knapp, MD  Admit date: 02/20/2018 Discharge date: 02/22/2018  Admitted From: Home Disposition:  Home  Recommendations for Outpatient Follow-up and new medication changes:  1. Follow up with PCP in 1- week 2. Hold on aspirin for now due to diverticular bleed.   Home Health: no  Equipment/Devices: no    Discharge Condition: stable CODE STATUS: full  Diet recommendation: Heart healthy and diabetic prudent  Brief/Interim Summary: 82 year old female who presents with rectal bleeding. She does have the significant past medical history for chronic diastolic heart failure, type 2 diabetes mellitus, hypertension, hypothyroidism and anxiety. Patient noted bright red blood per rectum while moving her bowels, no significant abdominal pain, nausea or vomiting. On initial physical examination blood pressure 134/84, heart rate 63, oxygen saturation 90%, moist mucous membranes, lungs clear to auscultation bilaterally, heart S1-S2 present rhythmic, abdomen soft, nontender, palpable mass in left lower quadrant, no lower extremity edema. . Sodium 139, potassium 5.0, bicarbonate 27, glucose 132, BUN 17, creatinine 1.35, white count 7.3, hemoglobin 13.3, hematocrit 40.8, platelets 210, CT of the abdomen with diverticulosis.   Patient was admitted to the hospital working diagnosis of lower GI bleed likely due to the diverticulosis.  1. Lower gastrointestinal bleed due to diverticulosis. Patient was admitted to the medical ward, she was placed on a remote telemetry monitor, her hemoglobin and hematocrit remained stable, no signs of further bleeding, no significant abdominal pain, aspirin was held. Discharge hemoglobin 13.2, hematocrit 40.1. Patient will continue MiraLAX daily, follow-up with Dr. Manuella Ghazi within 7 days. Patient reported having a colonoscopy 2015.   2. Chronic kidney disease stage III. Her kidney  function remained stable, discharge creatinine 1.20, potassium 4.6, serum bicarbonate 25. Patient tolerating diet by mouth adequately.  3. Hypertension. Blood pressure remained well controlled with diltiazem and metoprolol.  4. Type 2 diabetes mellitus. Patient was placed on insulin sliding scale for glucose coverage and monitoring, patient will resume glipizide at discharge.  5. Chronic diastolic heart failure. No signs of acute exacerbation, continue blood pressure control, hold any aspirin due to the diverticular bleed.  6. Hypothyroid. Continue levothyroxine.  Noted positive urine culture with greater than 100,000 colony-forming units of group B streptococcus, Streptococcus agalactiae.  No urinalysis performed, patient with no urinary symptoms.  I did call her daughter, today April 15, patient has been feeling well.  I attempted talking to the patient, but unfortunately I was not unable to reach her, I left a message in her answer machine.   Discharge Diagnoses:  Principal Problem:   Acute lower GI bleeding Active Problems:   Essential hypertension   Hypothyroidism   CKD (chronic kidney disease), stage III ([place])   Type 2 diabetes mellitus with diabetic polyneuropathy, without long-term current use of insulin (HCC)   Chronic diastolic CHF (congestive heart failure) ([place])    Discharge Instructions   Allergies as of 02/22/2018      Reactions   Codeine Other (See Comments)   hallucinations   Metformin And Related    Upset stomach   Ciprocin-fluocin-procin [fluocinolone Acetonide] Other (See Comments)   Unknown reaction   Clonidine Derivatives Other (See Comments)   Dry mouth   Crestor [rosuvastatin Calcium] Other (See Comments)   cramps   Penicillins Other (See Comments)   Has patient had a PCN reaction causing immediate rash, facial/tongue/throat swelling, SOB or lightheadedness with hypotension: no Has patient had a PCN reaction causing severe rash involving  mucus membranes  or skin necrosis: no Has patient had a PCN reaction that required hospitalization : no Has patient had a PCN reaction occurring within the last 10 years: no -Hallucinates If all of the above answers are "NO", then may proceed with Cephalosporin use.   Simvastatin Other (See Comments)   Upset stomach   Tessalon Perles Other (See Comments)   Gi upset      Medication List    STOP taking these medications   aspirin EC 81 MG tablet   metFORMIN 500 MG tablet Commonly known as:  GLUCOPHAGE     TAKE these medications   acetaminophen 500 MG tablet Commonly known as:  TYLENOL Take 500 mg by mouth every 6 (six) hours as needed for mild pain.   cholecalciferol 1000 units tablet Commonly known as:  VITAMIN D Take 1,000 Units by mouth daily.   clonazePAM 1 MG tablet Commonly known as:  KLONOPIN TAKE 1/2 tab po qam AND 1/2 tab po qNOON TIME, THEN 1 tab po qhs. **NEED OFFICE VISIT FOR ADDITIONAL REFILLS** What changed:    how much to take  how to take this  when to take this  reasons to take this  additional instructions   diltiazem 360 MG 24 hr capsule Commonly known as:  CARDIZEM CD Take 1 capsule (360 mg total) by mouth daily.   ECZEMA MOISTURIZING 1 % Lotn Generic drug:  Colloidal Oatmeal Apply 1 application topically as needed. Eczema cream   fluticasone 50 MCG/ACT nasal spray Commonly known as:  FLONASE PLACE 1 SPRAY INTO BOTH NOSTRILS DAILY What changed:  when to take this   furosemide 40 MG tablet Commonly known as:  LASIX Take 1 tablet (40 mg total) by mouth daily.   glipiZIDE 10 MG tablet Commonly known as:  GLUCOTROL TAKE TWO TABLETS (20 MG TOTAL) BY MOUTH TWO (TWO) TIMES DAILY BEFORE A MEAL. 30 MINUTES PRIOR   levothyroxine 88 MCG tablet Commonly known as:  SYNTHROID, LEVOTHROID Take 1 tablet (88 mcg total) by mouth daily before breakfast.   metoprolol tartrate 100 MG tablet Commonly known as:  LOPRESSOR Take 1 tablet (100 mg total) by mouth 2 (two)  times daily.   neomycin-polymyxin-dexameth 0.1 % Oint Commonly known as:  MAXITROL 1 application as needed. In her hear when needed for itching   omeprazole 40 MG capsule Commonly known as:  PRILOSEC Take 1 capsule (40 mg total) by mouth daily.   polyethylene glycol packet Commonly known as:  MIRALAX / GLYCOLAX Take 8.5 g by mouth every morning.   vitamin E 100 UNIT capsule Take 100 Units by mouth daily.       Allergies  Allergen Reactions  . Codeine Other (See Comments)    hallucinations  . Metformin And Related     Upset stomach  . Ciprocin-Fluocin-Procin [Fluocinolone Acetonide] Other (See Comments)    Unknown reaction  . Clonidine Derivatives Other (See Comments)    Dry mouth  . Crestor [Rosuvastatin Calcium] Other (See Comments)    cramps  . Penicillins Other (See Comments)    Has patient had a PCN reaction causing immediate rash, facial/tongue/throat swelling, SOB or lightheadedness with hypotension: no Has patient had a PCN reaction causing severe rash involving mucus membranes or skin necrosis: no Has patient had a PCN reaction that required hospitalization : no Has patient had a PCN reaction occurring within the last 10 years: no -Hallucinates If all of the above answers are "NO", then may proceed with Cephalosporin use.   Marland Kitchen  Simvastatin Other (See Comments)    Upset stomach   . Tessalon Perles Other (See Comments)    Gi upset     Consultations:     Procedures/Studies: Ct Abdomen Pelvis W Contrast  Result Date: 02/20/2018 CLINICAL DATA:  82 y/o F; abdominal pain with hematochezia that began today. EXAM: CT ABDOMEN AND PELVIS WITH CONTRAST TECHNIQUE: Multidetector CT imaging of the abdomen and pelvis was performed using the standard protocol following bolus administration of intravenous contrast. CONTRAST:  75 cc ISOVUE-300 IOPAMIDOL (ISOVUE-300) INJECTION 61% COMPARISON:  01/19/2018 CT abdomen and pelvis. FINDINGS: Lower chest: No acute abnormality.  Hepatobiliary: No focal liver abnormality is seen. Status post cholecystectomy. No biliary dilatation. Pancreas: Unremarkable. No pancreatic ductal dilatation or surrounding inflammatory changes. Spleen: Normal in size without focal abnormality. Adrenals/Urinary Tract: Status post right nephrectomy. Normal left adrenal gland. No focal abnormality of the left kidney mass. No hydronephrosis. Normal bladder. Stomach/Bowel: Stomach is within normal limits. Sigmoid diverticulosis without findings of acute diverticulitis. No evidence of bowel wall thickening, distention, or inflammatory changes. Stable small duodenum diverticulum arising from third segment of duodenum without inflammatory change. Normal appendix. Vascular/Lymphatic: Aortic atherosclerosis. No enlarged abdominal or pelvic lymph nodes. Reproductive: Status post hysterectomy. No adnexal masses. Other: Small left inguinal hernia containing fat. No abdominopelvic ascites. Musculoskeletal: No fracture is seen. Moderate lumbar spondylosis with grade 1 L4-5 anterolisthesis, multilevel discogenic degenerative changes, and lower lumbar facet arthrosis. Sclerosis of bilateral sacroiliac joints compatible with osteoarthrosis. IMPRESSION: 1. No acute process identified. 2. Mild sigmoid diverticulosis. No findings of acute diverticulitis. 3. Additional stable chronic findings as above. Electronically Signed   By: Kristine Garbe M.D.   On: 02/20/2018 22:58       Subjective: Patient is feeling better, positive lower back pain, moderate, no nausea or vomiting, no further bleeding, tolerating po well.   Discharge Exam: Vitals:   02/21/18 2108 02/22/18 0558  BP: (!) 154/85 (!) 148/81  Pulse: (!) 55 (!) 57  Resp: 18 17  Temp: 97.8 F (36.6 C) 98.1 F (36.7 C)  SpO2: 100% 100%   Vitals:   02/21/18 1526 02/21/18 2108 02/22/18 0500 02/22/18 0558  BP:  (!) 154/85  (!) 148/81  Pulse:  (!) 55  (!) 57  Resp:  18  17  Temp:  97.8 F (36.6 C)  98.1  F (36.7 C)  TempSrc:      SpO2:  100%  100%  Weight: 76.7 kg (169 lb 1.5 oz)  79.4 kg (175 lb 0.7 oz)   Height: 5\' 6"  (1.676 m)       General: Not in pain or dyspnea Neurology: Awake and alert, non focal  E ENT: no pallor, no icterus, oral mucosa moist Cardiovascular: No JVD. S1-S2 present, rhythmic, no gallops, rubs, or murmurs. No lower extremity edema. Pulmonary: vesicular breath sounds bilaterally, adequate air movement, no wheezing, rhonchi or rales. Gastrointestinal. Abdomen protuberant, no organomegaly, non tender, no rebound or guarding. Enlarged pannus.  Skin. No rashes Musculoskeletal: no joint deformities/ pain to palpation at the paraspinal region on the right.    The results of significant diagnostics from this hospitalization (including imaging, microbiology, ancillary and laboratory) are listed below for reference.     Microbiology: No results found for this or any previous visit (from the past 240 hour(s)).   Labs: BNP (last 3 results) No results for input(s): BNP in the last 8760 hours. Basic Metabolic Panel: Recent Labs  Lab 02/20/18 1829 02/21/18 0358 02/22/18 0541  NA 139 137 140  K 5.0 3.9 4.6  CL 104 101 103  CO2 27 26 25   GLUCOSE 132* 212* 104*  BUN 17 17 12   CREATININE 1.35* 1.35* 1.20*  CALCIUM 9.6 9.4 9.6   Liver Function Tests: Recent Labs  Lab 02/20/18 1829  AST 28  ALT 19  ALKPHOS 69  BILITOT 1.0  PROT 7.8  ALBUMIN 4.1   No results for input(s): LIPASE, AMYLASE in the last 168 hours. No results for input(s): AMMONIA in the last 168 hours. CBC: Recent Labs  Lab 02/20/18 1829 02/21/18 0052 02/21/18 1350 02/22/18 0541  WBC 7.3  --   --  6.6  NEUTROABS  --   --   --  3.3  HGB 13.3 12.5 12.5 13.2  HCT 40.8 37.8 38.6 40.1  MCV 97.8  --   --  98.5  PLT 210  --   --  176   Cardiac Enzymes: No results for input(s): CKTOTAL, CKMB, CKMBINDEX, TROPONINI in the last 168 hours. BNP: Invalid input(s): POCBNP CBG: Recent Labs   Lab 02/21/18 0808 02/21/18 1348 02/21/18 1706 02/21/18 2110 02/22/18 0811  GLUCAP 164* 96 150* 153* 133*   D-Dimer No results for input(s): DDIMER in the last 72 hours. Hgb A1c No results for input(s): HGBA1C in the last 72 hours. Lipid Profile No results for input(s): CHOL, HDL, LDLCALC, TRIG, CHOLHDL, LDLDIRECT in the last 72 hours. Thyroid function studies No results for input(s): TSH, T4TOTAL, T3FREE, THYROIDAB in the last 72 hours.  Invalid input(s): FREET3 Anemia work up No results for input(s): VITAMINB12, FOLATE, FERRITIN, TIBC, IRON, RETICCTPCT in the last 72 hours. Urinalysis    Component Value Date/Time   COLORURINE YELLOW 01/19/2018 0835   APPEARANCEUR HAZY (A) 01/19/2018 0835   LABSPEC 1.020 01/19/2018 0835   PHURINE 5.0 01/19/2018 0835   GLUCOSEU NEGATIVE 01/19/2018 0835   HGBUR NEGATIVE 01/19/2018 0835   BILIRUBINUR NEGATIVE 01/19/2018 0835   BILIRUBINUR negative 12/07/2017 1647   BILIRUBINUR small 11/09/2014 1629   KETONESUR NEGATIVE 01/19/2018 0835   PROTEINUR NEGATIVE 01/19/2018 0835   UROBILINOGEN 0.2 12/07/2017 1647   UROBILINOGEN 0.2 04/28/2013 2321   NITRITE NEGATIVE 01/19/2018 0835   LEUKOCYTESUR NEGATIVE 01/19/2018 0835   Sepsis Labs Invalid input(s): PROCALCITONIN,  WBC,  LACTICIDVEN Microbiology No results found for this or any previous visit (from the past 240 hour(s)).   Time coordinating discharge: 45 minutes  SIGNED:   Tawni Millers, MD  [facility] 02/22/2018, 11:30 AM Pager [phone]  If 7PM-7AM, please contact night-coverage [email or web address] Password [email or web address]

## 2018-02-23 LAB — URINE CULTURE: Culture: >=100000 — AB

## 2018-03-13 ENCOUNTER — Other Ambulatory Visit: Payer: Self-pay | Admitting: Family Medicine

## 2018-03-13 DIAGNOSIS — R601 Generalized edema: Secondary | ICD-10-CM

## 2018-03-13 DIAGNOSIS — E038 Other specified hypothyroidism: Secondary | ICD-10-CM

## 2018-03-13 DIAGNOSIS — I10 Essential (primary) hypertension: Secondary | ICD-10-CM

## 2018-03-13 NOTE — Telephone Encounter (Signed)
Request for Klonopin, Metoprolol and Glipizide  LOV: 12/07/17 Last refill of Klonopin: 02/19/18 #60  PCP: Dr. Katharina Caper [facility]

## 2018-03-14 MED ORDER — DILTIAZEM HCL ER COATED BEADS 360 MG PO CP24: 360.0000 mg | ORAL_CAPSULE | Freq: Every day | ORAL | 0 refills | Status: DC

## 2018-03-14 MED ORDER — OMEPRAZOLE 40 MG PO CPDR: 40.0000 mg | DELAYED_RELEASE_CAPSULE | Freq: Every day | ORAL | 0 refills | Status: DC

## 2018-03-14 MED ORDER — LEVOTHYROXINE SODIUM 88 MCG PO TABS: 88.0000 ug | ORAL_TABLET | Freq: Every day | ORAL | 0 refills | Status: DC

## 2018-03-14 MED ORDER — FUROSEMIDE 40 MG PO TABS: 40.0000 mg | ORAL_TABLET | Freq: Every day | ORAL | 0 refills | Status: DC

## 2018-03-14 NOTE — Telephone Encounter (Signed)
Pt needs OV before any refills - will provide 1 month of refills except no more on the klonopin as it appears pt was already told when that was refilled on 3/28 that she will need an OV for any additional refills (and it looks like that was a 2 mo supply maybe). Needs to be FASTING for labs at her OV.

## 2018-04-23 ENCOUNTER — Encounter: Payer: Self-pay | Admitting: Family Medicine

## 2018-04-24 ENCOUNTER — Other Ambulatory Visit: Payer: Self-pay | Admitting: Family Medicine

## 2018-04-25 ENCOUNTER — Other Ambulatory Visit: Payer: Self-pay | Admitting: Family Medicine

## 2018-04-25 DIAGNOSIS — R601 Generalized edema: Secondary | ICD-10-CM

## 2018-04-25 DIAGNOSIS — E038 Other specified hypothyroidism: Secondary | ICD-10-CM

## 2018-04-25 DIAGNOSIS — I10 Essential (primary) hypertension: Secondary | ICD-10-CM

## 2018-04-25 NOTE — Telephone Encounter (Signed)
Refill request Klonipin 1 mg  LOV 12-07-2017  Dr Tamala Julian  Last filled  02/19/2018  Dr Brigitte Pulse  60 tabs 1 refill  Note says patient taking differently  Pharmacy on file

## 2018-05-02 ENCOUNTER — Other Ambulatory Visit: Payer: Self-pay | Admitting: Family Medicine

## 2018-05-02 DIAGNOSIS — R601 Generalized edema: Secondary | ICD-10-CM

## 2018-05-02 NOTE — Telephone Encounter (Signed)
Patient called to notify an appointment will need to be scheduled before refills of medications, unable to leave message due to mailbox full.

## 2018-05-05 ENCOUNTER — Telehealth: Payer: Self-pay | Admitting: Family Medicine

## 2018-05-05 DIAGNOSIS — R601 Generalized edema: Secondary | ICD-10-CM

## 2018-05-05 DIAGNOSIS — E038 Other specified hypothyroidism: Secondary | ICD-10-CM

## 2018-05-05 DIAGNOSIS — I10 Essential (primary) hypertension: Secondary | ICD-10-CM

## 2018-05-05 NOTE — Telephone Encounter (Signed)
Needs appointment for any refills on controlled meds.

## 2018-05-06 NOTE — Telephone Encounter (Signed)
Medications refused - patient needs office visit for further refills. Please call patient to schedule.

## 2018-05-06 NOTE — Telephone Encounter (Signed)
Prilosec, Synthroid, Lasix, Cardizem, Klonopin refill requests  LOV 12/07/17 with Dr. Delrae Rend last refill:  02/19/18  #60   0 refills     Needs appt for further refills per notes  [place], [place]

## 2018-05-13 ENCOUNTER — Other Ambulatory Visit: Payer: Self-pay

## 2018-05-13 ENCOUNTER — Ambulatory Visit (INDEPENDENT_AMBULATORY_CARE_PROVIDER_SITE_OTHER): Payer: Medicare Other

## 2018-05-13 ENCOUNTER — Ambulatory Visit (INDEPENDENT_AMBULATORY_CARE_PROVIDER_SITE_OTHER): Payer: Medicare Other | Admitting: Family Medicine

## 2018-05-13 ENCOUNTER — Encounter: Payer: Self-pay | Admitting: Family Medicine

## 2018-05-13 VITALS — BP 140/70 | HR 69 | Temp 99.2°F | Ht 66.34 in | Wt 168.0 lb

## 2018-05-13 DIAGNOSIS — M25511 Pain in right shoulder: Secondary | ICD-10-CM | POA: Diagnosis not present

## 2018-05-13 DIAGNOSIS — M545 Low back pain, unspecified: Secondary | ICD-10-CM

## 2018-05-13 DIAGNOSIS — T148XXA Other injury of unspecified body region, initial encounter: Secondary | ICD-10-CM

## 2018-05-13 LAB — POCT URINALYSIS DIP (MANUAL ENTRY)
Bilirubin, UA: NEGATIVE
Blood, UA: NEGATIVE
Glucose, UA: NEGATIVE mg/dL
Ketones, POC UA: NEGATIVE mg/dL
Leukocytes, UA: NEGATIVE
Nitrite, UA: NEGATIVE
Protein Ur, POC: NEGATIVE mg/dL
Spec Grav, UA: 1.025 (ref 1.010–1.025)
Urobilinogen, UA: 0.2 E.U./dL
pH, UA: 6 (ref 5.0–8.0)

## 2018-05-13 MED ORDER — DICLOFENAC SODIUM 1 % TD GEL: 2.0000 g | Freq: Four times a day (QID) | TRANSDERMAL | 2 refills | Status: DC

## 2018-05-13 NOTE — Patient Instructions (Addendum)
     IF you received an x-ray today, you will receive an invoice from [facility]. Please contact [facility] at [phone] with questions or concerns regarding your invoice.   IF you received labwork today, you will receive an invoice from [facility]. Please contact [facility] at [phone] with questions or concerns regarding your invoice.   Our billing staff will not be able to assist you with questions regarding bills from these companies.  You will be contacted with the lab results as soon as they are available. The fastest way to get your results is to activate your My Chart account. Instructions are located on the last page of this paperwork. If you have not heard from [place] regarding the results in 2 weeks, please contact this office.     Shoulder Pain Many things can cause shoulder pain, including:  An injury to the area.  Overuse of the shoulder.  Arthritis.  The source of the pain can be:  Inflammation.  An injury to the shoulder joint.  An injury to a tendon, ligament, or bone.  Follow these instructions at home: Take these actions to help with your pain:  Squeeze a soft ball or a foam pad as much as possible. This helps to keep the shoulder from swelling. It also helps to strengthen the arm.  Take over-the-counter and prescription medicines only as told by your health care provider.  If directed, apply ice to the area: ? Put ice in a plastic bag. ? Place a towel between your skin and the bag. ? Leave the ice on for 20 minutes, 2-3 times per day. Stop applying ice if it does not help with the pain.  If you were given a shoulder sling or immobilizer: ? Wear it as told. ? Remove it to shower or bathe. ? Move your arm as little as possible, but keep your hand moving to prevent swelling.  Contact a health care provider if:  Your pain gets worse.  Your pain is not relieved with medicines.  New pain develops in your arm, hand, or fingers. Get  help right away if:  Your arm, hand, or fingers: ? Tingle. ? Become numb. ? Become swollen. ? Become painful. ? Turn white or blue. This information is not intended to replace advice given to you by your health care provider. Make sure you discuss any questions you have with your health care provider. Document Released: 08/22/2005 Document Revised: 07/08/2016 Document Reviewed: 03/07/2015 Elsevier Interactive Patient Education  [employer].

## 2018-05-13 NOTE — Progress Notes (Signed)
6/18/201910:14 AM  Sherri Burns 1936-06-01, 82 y.o. female [id number]  Chief Complaint  Patient presents with  . Pain    pain in right arm with burning hot sensation. Every morming having pain in the abdominal area, headaches and insomnia problems  . Medication Refill    needs all meds refilled    HPI:   Patient is a 82 y.o. female with past medical history significant for CKD3, HFpEF grade 1, HTN, HLP, diverticulitis, GERD, hypothyroidism and long term bzd use for anxiety who presents today for right shoulder pain  Pain and swelling about 2-3 weeks ago, worse when trying to raise her shoulder She has been moving, but not been lifting anything Feels at time that her shoulder is going to dislocate Pain radiates up her right neck and head and down her arm and back She reports baseline decreased in strength in her right arm secondary to work injury She denies any numbness or tingling, she does have swelling of shoulder and less so of hand She is right handed She has been using biofreeze and taking tylenol once a day She denies any previous issus with this shoulder  Fall Risk  05/13/2018 12/07/2017 10/03/2017 09/26/2017 07/25/2017  Falls in the past year? No No No No No  Number falls in past yr: - - - - -  Injury with Fall? - - - - -     Depression screen [facility] 2/9 05/13/2018 12/07/2017 10/03/2017  Decreased Interest 0 1 0  Down, Depressed, Hopeless 0 3 0  PHQ - 2 Score 0 4 0  Altered sleeping - 2 -  Tired, decreased energy - 3 -  Change in appetite - 2 -  Feeling bad or failure about yourself  - 3 -  Trouble concentrating - 2 -  Moving slowly or fidgety/restless - 1 -  Suicidal thoughts - 1 -  PHQ-9 Score - 18 -  Difficult doing work/chores - Somewhat difficult -  Some recent data might be hidden    Allergies  Allergen Reactions  . Codeine Other (See Comments)    hallucinations  . Metformin And Related     Upset stomach  . Ciprocin-Fluocin-Procin [Fluocinolone  Acetonide] Other (See Comments)    Unknown reaction  . Clonidine Derivatives Other (See Comments)    Dry mouth  . Crestor [Rosuvastatin Calcium] Other (See Comments)    cramps  . Penicillins Other (See Comments)    Has patient had a PCN reaction causing immediate rash, facial/tongue/throat swelling, SOB or lightheadedness with hypotension: no Has patient had a PCN reaction causing severe rash involving mucus membranes or skin necrosis: no Has patient had a PCN reaction that required hospitalization : no Has patient had a PCN reaction occurring within the last 10 years: no -Hallucinates If all of the above answers are "NO", then may proceed with Cephalosporin use.   . Simvastatin Other (See Comments)    Upset stomach   . Tessalon Perles Other (See Comments)    Gi upset     Prior to Admission medications   Medication Sig Start Date End Date Taking? Authorizing Provider  cholecalciferol (VITAMIN D) 1000 units tablet Take 1,000 Units by mouth daily.   Yes [provider]  clonazePAM (KLONOPIN) 1 MG tablet TAKE 1/2 tab po qam AND 1/2 tab po qNOON TIME, THEN 1 tab po qhs. **NEED OFFICE VISIT FOR ADDITIONAL REFILLS** Patient taking differently: Take 0.5 mg by mouth 3 (three) times daily as needed. TAKE 0.5 mg  tab  po qam AND 0.5 mg  tab po qNOON TIME, THEN 1mg   tab po qhs. **NEED OFFICE VISIT FOR ADDITIONAL REFILLS** 02/19/18  Yes Shawnee Knapp, MD  diltiazem (CARDIZEM CD) 360 MG 24 hr capsule Take 1 capsule (360 mg total) by mouth daily. NEED OFFICE VISIT FOR ANY ADDITIONAL REFILLS. 03/14/18  Yes Shawnee Knapp, MD  fluticasone (FLONASE) 50 MCG/ACT nasal spray PLACE 1 SPRAY INTO BOTH NOSTRILS DAILY Patient taking differently: Place 1 spray into both nostrils at bedtime.  01/10/18  Yes Shawnee Knapp, MD  furosemide (LASIX) 40 MG tablet Take 1 tablet (40 mg total) by mouth daily. NEED OFFICE VISIT FOR ANY ADDITIONAL REFILLS 03/14/18  Yes Shawnee Knapp, MD  glipiZIDE (GLUCOTROL) 10 MG tablet TAKE 2  TABS (=20 MG) PO 2 (TWO) TIMES DAILY 30 MIN BEFORE A MEAL. **NEED OFFICE VISIT FOR ANY ADDITIONAL REFILLS** 03/14/18  Yes Shawnee Knapp, MD  levothyroxine (SYNTHROID, LEVOTHROID) 88 MCG tablet Take 1 tablet (88 mcg total) by mouth daily before breakfast. NEED OFFICE VISIT FOR ANY ADDITIONAL REFILLS 03/14/18  Yes Shawnee Knapp, MD  metoprolol tartrate (LOPRESSOR) 100 MG tablet Take 1 tablet (100 mg total) by mouth 2 (two) times daily. **NEED OFFICE VISIT FOR ADDITIONAL REFILLS** 03/14/18  Yes Shawnee Knapp, MD  neomycin-polymyxin-dexameth (MAXITROL) 0.1 % OINT 1 application as needed. In her hear when needed for itching   Yes [provider]  omeprazole (PRILOSEC) 40 MG capsule Take 1 capsule (40 mg total) by mouth daily. NEED OFFICE VISIT FOR ANY ADDITIONAL REFILLS 03/14/18  Yes Shawnee Knapp, MD  polyethylene glycol [facility] / GLYCOLAX) packet Take 8.5 g by mouth every morning.    Yes [provider]  vitamin E 100 UNIT capsule Take 100 Units by mouth daily.   Yes [provider]  acetaminophen (TYLENOL) 500 MG tablet Take 500 mg by mouth every 6 (six) hours as needed for mild pain.    [provider]  Colloidal Oatmeal (ECZEMA MOISTURIZING) 1 % LOTN Apply 1 application topically as needed. Eczema cream    [provider]    Past Medical History:  Diagnosis Date  . Abnormal EKG 02/07/2016   Inferolateral T wave inversion and ST depression.  . Acute calculous cholecystitis   . Anxiety   . Arthritis   . Chronic diastolic CHF (congestive heart failure) ([place]) 02/21/2018  . Colon polyps 11/2008   tubular adenoma  . Diabetes mellitus   . Esophageal problem    esophageal dilation  . GERD (gastroesophageal reflux disease)    + hpylori  . GI bleeding 01/2018  . Headache   . Hyperlipidemia   . Hypertension   . Hypothyroidism   . Ischemic colitis ([place])   . Renal cancer, right ([place]) 2010   s/p Rt nephrectomy by Dr. Rosana Hoes  . Renal insufficiency     Past  Surgical History:  Procedure Laterality Date  . ABDOMINAL HYSTERECTOMY  1978   partial  . CHOLECYSTECTOMY N/A 04/04/2016   Procedure: LAPAROSCOPIC CHOLECYSTECTOMY;  Surgeon: Autumn Messing III, MD;  Location: [facility];  Service: General;  Laterality: N/A;  . COLONOSCOPY    . ESOPHAGEAL DILATION  2016  . HERNIA REPAIR    . LAPAROSCOPIC CHOLECYSTECTOMY  04/04/2016  . NEPHRECTOMY Right 2010   Dr. Rosana Hoes, urology, due to renal cancer    Social History   Tobacco Use  . Smoking status: Current Some Day Smoker    Packs/day: 0.25    Years: 55.00  Pack years: 13.75    Types: Cigarettes  . Smokeless tobacco: Never Used  . Tobacco comment: cut back amount still  trying to quit  Substance Use Topics  . Alcohol use: No    Alcohol/week: 0.0 oz    Family History  Problem Relation Age of Onset  . Cancer Mother        colon, kidney cancer  . Cancer Father        throat cancer  . Cancer Sister   . Heart attack Brother     ROS Per hpi Denies any fever, chills, redness of shoulder She denies any swelling, warmth or pain of other joints  OBJECTIVE:  Blood pressure 140/70, pulse 69, temperature 99.2 F (37.3 C), temperature source Oral, height 5' 6.34" (1.685 m), weight 168 lb (76.2 kg), SpO2 97 %.  Physical Exam  Constitutional: She is oriented to person, place, and time. She appears well-developed and well-nourished.  HENT:  Head: Normocephalic and atraumatic.  Mouth/Throat: Mucous membranes are normal.  Eyes: Pupils are equal, round, and reactive to light. EOM are normal. No scleral icterus.  Neck: Muscular tenderness present. No spinous process tenderness present. No neck rigidity. Decreased range of motion present.  Pulmonary/Chest: Effort normal.  Musculoskeletal:       Right shoulder: She exhibits decreased range of motion, tenderness, swelling and spasm. She exhibits no bony tenderness, no effusion, no crepitus, normal pulse and normal strength.  Neurological: She is alert and  oriented to person, place, and time.  Skin: Skin is warm and dry.  Nursing note and vitals reviewed.   Results for orders placed or performed in visit on 05/13/18 (from the past 24 hour(s))  POCT urinalysis dipstick     Status: None   Collection Time: 05/13/18 10:35 AM  Result Value Ref Range   Color, UA yellow yellow   Clarity, UA clear clear   Glucose, UA negative negative mg/dL   Bilirubin, UA negative negative   Ketones, POC UA negative negative mg/dL   Spec Grav, UA 1.025 1.010 - 1.025   Blood, UA negative negative   pH, UA 6.0 5.0 - 8.0   Protein Ur, POC negative negative mg/dL   Urobilinogen, UA 0.2 0.2 or 1.0 E.U./dL   Nitrite, UA Negative Negative   Leukocytes, UA Negative Negative    Dg Shoulder Right  Result Date: 05/13/2018 CLINICAL DATA:  Pain and swelling with decreased range of motion EXAM: RIGHT SHOULDER - 2+ VIEW COMPARISON:  None. FINDINGS: Frontal, Y scapular, and axillary images were obtained. There is no fracture or dislocation. There is moderate generalized osteoarthritic change. No erosive change or intra-articular calcification. Visualized right lung is clear. IMPRESSION: Moderate generalized osteoarthritic change. No fracture or dislocation. Electronically Signed   By: Lowella Grip III M.D.   On: 05/13/2018 10:57     ASSESSMENT and PLAN  1. Acute pain of right shoulder Discussed conservative measures, diclofenac top (no oral NSAIDs given recent diverticular bleed and CKD3), ice, elevation, referring to PT. - DG Shoulder Right; Future - Ambulatory referral to Physical Therapy  2. Muscle strain - Ambulatory referral to Physical Therapy  3. Acute right-sided low back pain without sciatica - POCT urinalysis dipstick  Other orders - diclofenac sodium (VOLTAREN) 1 % GEL; Apply 2 g topically 4 (four) times daily.  Return in about 2 weeks (around 05/27/2018).    Rutherford Guys, MD Primary Care at [place], [place]  [address] Ph.  [phone] Fax [phone]

## 2018-05-26 ENCOUNTER — Other Ambulatory Visit: Payer: Self-pay | Admitting: Family Medicine

## 2018-05-27 ENCOUNTER — Other Ambulatory Visit: Payer: Self-pay

## 2018-05-27 ENCOUNTER — Encounter: Payer: Self-pay | Admitting: Family Medicine

## 2018-05-27 ENCOUNTER — Ambulatory Visit (INDEPENDENT_AMBULATORY_CARE_PROVIDER_SITE_OTHER): Payer: Medicare Other | Admitting: Family Medicine

## 2018-05-27 DIAGNOSIS — F411 Generalized anxiety disorder: Secondary | ICD-10-CM | POA: Diagnosis not present

## 2018-05-27 DIAGNOSIS — E1142 Type 2 diabetes mellitus with diabetic polyneuropathy: Secondary | ICD-10-CM | POA: Diagnosis not present

## 2018-05-27 DIAGNOSIS — N183 Chronic kidney disease, stage 3 unspecified: Secondary | ICD-10-CM

## 2018-05-27 DIAGNOSIS — E038 Other specified hypothyroidism: Secondary | ICD-10-CM

## 2018-05-27 DIAGNOSIS — I5032 Chronic diastolic (congestive) heart failure: Secondary | ICD-10-CM | POA: Diagnosis not present

## 2018-05-27 DIAGNOSIS — I10 Essential (primary) hypertension: Secondary | ICD-10-CM | POA: Diagnosis not present

## 2018-05-27 DIAGNOSIS — R102 Pelvic and perineal pain: Secondary | ICD-10-CM | POA: Diagnosis not present

## 2018-05-27 LAB — POCT URINALYSIS DIP (MANUAL ENTRY)
Bilirubin, UA: NEGATIVE
Blood, UA: NEGATIVE
Glucose, UA: NEGATIVE mg/dL
Ketones, POC UA: NEGATIVE mg/dL
Leukocytes, UA: NEGATIVE
Nitrite, UA: NEGATIVE
Protein Ur, POC: NEGATIVE mg/dL
Spec Grav, UA: 1.01 (ref 1.010–1.025)
Urobilinogen, UA: 0.2 E.U./dL
pH, UA: 6.5 (ref 5.0–8.0)

## 2018-05-27 MED ORDER — FUROSEMIDE 40 MG PO TABS: 40.0000 mg | ORAL_TABLET | Freq: Every day | ORAL | 0 refills | Status: DC

## 2018-05-27 MED ORDER — DILTIAZEM HCL ER COATED BEADS 360 MG PO CP24: 360.0000 mg | ORAL_CAPSULE | Freq: Every day | ORAL | 0 refills | Status: DC

## 2018-05-27 MED ORDER — CLONAZEPAM 1 MG PO TABS: ORAL_TABLET | ORAL | 1 refills | Status: DC

## 2018-05-27 NOTE — Progress Notes (Signed)
7/2/201912:00 PM  Sherri Burns 18-Jun-1936, 82 y.o. female [id number]  Chief Complaint  Patient presents with  . Abdominal Pain    X 3- 4 mth  . Shoulder Pain    X 4 weeks- f/u  . Medication Refill    clonazepam , cardizem and Lasix    HPI:   Patient is a 83 y.o. female with past medical history significant for DM2, CKD3, s/p nephrectomy, HTN, anxiety, hypothyroidism who presents today for followup  Right shoulder with mild improvement, able to move more, still pain if she tries to reach behind her or tries to lift with that arm Diclofenac gel provides partial relief She has not heard from PT  Chronic pelvic pain for over a year. Left worse than right. Reassuring abd/pelvic ct in march 2019 Patient reports that every morning around 5-6am, she will get a sharp stabbing pain from deep in her vagina to her lower abdomen Followed with significant urge to urinate multiple times  She denies any BM associated with this She states that then she has no pain through the rest of the day She is worried there is something wrong with an ovary She reports constipation well managed with miralax in her coffee.  She denies any dysuria or hematuria  Otherwise takes bp meds as prescribed S/po nephrectomy Not followed by renal Last crt 1.2, gfr 42, stable, march 2019 Echo 2015: LVEF 65%, grade 1 DD, mild valvular dysfxn. No pHTN  Last a1c 8.9, nov 2018 Intolerant on metformin  Has not followed up since then Reports fastings in low 100s She does try to limit her carb and portion intake  She takes clonazepam daily,  1/2 tab in AM and 1 tab at bedtime Not working as well anymore Has not tried anything other than bzd Her husband died and her son committed suicide in recent past  She takes levothyroxine daily as prescribed Denies any symptoms   Fall Risk  05/27/2018 05/13/2018 12/07/2017 10/03/2017 09/26/2017  Falls in the past year? No No No No No  Number falls in past yr: - - - - -    Injury with Fall? - - - - -     Depression screen [facility] 2/9 05/27/2018 05/13/2018 12/07/2017  Decreased Interest 0 0 1  Down, Depressed, Hopeless 0 0 3  PHQ - 2 Score 0 0 4  Altered sleeping - - 2  Tired, decreased energy - - 3  Change in appetite - - 2  Feeling bad or failure about yourself  - - 3  Trouble concentrating - - 2  Moving slowly or fidgety/restless - - 1  Suicidal thoughts - - 1  PHQ-9 Score - - 18  Difficult doing work/chores - - Somewhat difficult  Some recent data might be hidden    Allergies  Allergen Reactions  . Codeine Other (See Comments)    hallucinations  . Metformin And Related     Upset stomach  . Ciprocin-Fluocin-Procin [Fluocinolone Acetonide] Other (See Comments)    Unknown reaction  . Clonidine Derivatives Other (See Comments)    Dry mouth  . Crestor [Rosuvastatin Calcium] Other (See Comments)    cramps  . Penicillins Other (See Comments)    Has patient had a PCN reaction causing immediate rash, facial/tongue/throat swelling, SOB or lightheadedness with hypotension: no Has patient had a PCN reaction causing severe rash involving mucus membranes or skin necrosis: no Has patient had a PCN reaction that required hospitalization : no Has patient had a PCN  reaction occurring within the last 10 years: no -Hallucinates If all of the above answers are "NO", then may proceed with Cephalosporin use.   . Simvastatin Other (See Comments)    Upset stomach   . Tessalon Perles Other (See Comments)    Gi upset     Prior to Admission medications   Medication Sig Start Date End Date Taking? Authorizing Provider  acetaminophen (TYLENOL) 500 MG tablet Take 500 mg by mouth every 6 (six) hours as needed for mild pain.   Yes [provider]  cholecalciferol (VITAMIN D) 1000 units tablet Take 1,000 Units by mouth daily.   Yes [provider]  clonazePAM (KLONOPIN) 1 MG tablet TAKE 1/2 tab po qam AND 1/2 tab po qNOON TIME, THEN 1 tab po qhs. **NEED  OFFICE VISIT FOR ADDITIONAL REFILLS** Patient taking differently: Take 0.5 mg by mouth 3 (three) times daily as needed. TAKE 0.5 mg  tab po qam AND 0.5 mg  tab po qNOON TIME, THEN 1mg   tab po qhs. **NEED OFFICE VISIT FOR ADDITIONAL REFILLS** 02/19/18  Yes Shawnee Knapp, MD  Colloidal Oatmeal (ECZEMA MOISTURIZING) 1 % LOTN Apply 1 application topically as needed. Eczema cream   Yes [provider]  diclofenac sodium (VOLTAREN) 1 % GEL Apply 2 g topically 4 (four) times daily. 05/13/18  Yes Rutherford Guys, MD  diltiazem (CARDIZEM CD) 360 MG 24 hr capsule Take 1 capsule (360 mg total) by mouth daily. NEED OFFICE VISIT FOR ANY ADDITIONAL REFILLS. 03/14/18  Yes Shawnee Knapp, MD  fluticasone (FLONASE) 50 MCG/ACT nasal spray PLACE 1 SPRAY INTO BOTH NOSTRILS DAILY Patient taking differently: Place 1 spray into both nostrils at bedtime.  01/10/18  Yes Shawnee Knapp, MD  furosemide (LASIX) 40 MG tablet Take 1 tablet (40 mg total) by mouth daily. NEED OFFICE VISIT FOR ANY ADDITIONAL REFILLS 03/14/18  Yes Shawnee Knapp, MD  glipiZIDE (GLUCOTROL) 10 MG tablet TAKE TWO TABLETS BY MOUTH TWICE A DAY 30 MIN BEFORE A MEAL **NEED OFFICE VISIT FOR ADDITIONAL REFILLS** 05/26/18  Yes Shawnee Knapp, MD  levothyroxine (SYNTHROID, LEVOTHROID) 88 MCG tablet Take 1 tablet (88 mcg total) by mouth daily before breakfast. NEED OFFICE VISIT FOR ANY ADDITIONAL REFILLS 03/14/18  Yes Shawnee Knapp, MD  metoprolol tartrate (LOPRESSOR) 100 MG tablet Take 1 tablet (100 mg total) by mouth 2 (two) times daily. **NEED OFFICE VISIT FOR ADDITIONAL REFILLS** 03/14/18  Yes Shawnee Knapp, MD  neomycin-polymyxin-dexameth (MAXITROL) 0.1 % OINT 1 application as needed. In her hear when needed for itching   Yes [provider]  omeprazole (PRILOSEC) 40 MG capsule Take 1 capsule (40 mg total) by mouth daily. NEED OFFICE VISIT FOR ANY ADDITIONAL REFILLS 03/14/18  Yes Shawnee Knapp, MD  polyethylene glycol [facility] / GLYCOLAX) packet Take 8.5 g by mouth every  morning.    Yes [provider]  vitamin E 100 UNIT capsule Take 100 Units by mouth daily.   Yes [provider]    Past Medical History:  Diagnosis Date  . Abnormal EKG 02/07/2016   Inferolateral T wave inversion and ST depression.  . Acute calculous cholecystitis   . Anxiety   . Arthritis   . Chronic diastolic CHF (congestive heart failure) ([place]) 02/21/2018  . Colon polyps 11/2008   tubular adenoma  . Diabetes mellitus   . Esophageal problem    esophageal dilation  . GERD (gastroesophageal reflux disease)    + hpylori  . GI bleeding  01/2018  . Headache   . Hyperlipidemia   . Hypertension   . Hypothyroidism   . Ischemic colitis ([place])   . Renal cancer, right ([place]) 2010   s/p Rt nephrectomy by Dr. Rosana Hoes  . Renal insufficiency     Past Surgical History:  Procedure Laterality Date  . ABDOMINAL HYSTERECTOMY  1978   partial  . CHOLECYSTECTOMY N/A 04/04/2016   Procedure: LAPAROSCOPIC CHOLECYSTECTOMY;  Surgeon: Autumn Messing III, MD;  Location: [facility];  Service: General;  Laterality: N/A;  . COLONOSCOPY    . ESOPHAGEAL DILATION  2016  . HERNIA REPAIR    . LAPAROSCOPIC CHOLECYSTECTOMY  04/04/2016  . NEPHRECTOMY Right 2010   Dr. Rosana Hoes, urology, due to renal cancer    Social History   Tobacco Use  . Smoking status: Current Some Day Smoker    Packs/day: 0.25    Years: 55.00    Pack years: 13.75    Types: Cigarettes  . Smokeless tobacco: Never Used  . Tobacco comment: cut back amount still  trying to quit  Substance Use Topics  . Alcohol use: No    Alcohol/week: 0.0 oz    Family History  Problem Relation Age of Onset  . Cancer Mother        colon, kidney cancer  . Cancer Father        throat cancer  . Cancer Sister   . Heart attack Brother     Review of Systems  Constitutional: Negative for chills and fever.  Respiratory: Negative for cough and shortness of breath.   Cardiovascular: Negative for chest pain, palpitations, orthopnea, leg swelling and  PND.  Gastrointestinal: Positive for abdominal pain. Negative for blood in stool, melena, nausea and vomiting.  Genitourinary: Positive for frequency and urgency. Negative for dysuria and hematuria.  Musculoskeletal: Positive for back pain.     OBJECTIVE:  Blood pressure 132/78, pulse 68, temperature 98.6 F (37 C), temperature source Oral, resp. rate 18, height 5' 6.22" (1.682 m), weight 167 lb 12.8 oz (76.1 kg), SpO2 95 %.  Wt Readings from Last 3 Encounters:  05/27/18 167 lb 12.8 oz (76.1 kg)  05/13/18 168 lb (76.2 kg)  02/22/18 175 lb 0.7 oz (79.4 kg)    Physical Exam  Constitutional: She is oriented to person, place, and time. She appears well-developed and well-nourished.  HENT:  Head: Normocephalic and atraumatic.  Mouth/Throat: Oropharynx is clear and moist. No oropharyngeal exudate.  Eyes: Pupils are equal, round, and reactive to light. EOM are normal. No scleral icterus.  Neck: Neck supple.  Cardiovascular: Normal rate, regular rhythm and normal heart sounds. Exam reveals no gallop and no friction rub.  No murmur heard. Pulmonary/Chest: Effort normal and breath sounds normal. She has no wheezes. She has no rales.  Abdominal: Soft. Bowel sounds are normal. There is no hepatosplenomegaly. There is tenderness in the right lower quadrant, suprapubic area and left lower quadrant. There is no rebound, no guarding, no CVA tenderness and no tenderness at McBurney's point. No hernia.  Musculoskeletal: She exhibits no edema.  Neurological: She is alert and oriented to person, place, and time.  Skin: Skin is warm and dry.  Psychiatric: She has a normal mood and affect.  Vitals reviewed.   Results for orders placed or performed in visit on 05/27/18 (from the past 24 hour(s))  POCT urinalysis dipstick     Status: Abnormal   Collection Time: 05/27/18 12:30 PM  Result Value Ref Range   Color, UA yellow yellow  Clarity, UA cloudy (A) clear   Glucose, UA negative negative mg/dL    Bilirubin, UA negative negative   Ketones, POC UA negative negative mg/dL   Spec Grav, UA 1.010 1.010 - 1.025   Blood, UA negative negative   pH, UA 6.5 5.0 - 8.0   Protein Ur, POC negative negative mg/dL   Urobilinogen, UA 0.2 0.2 or 1.0 E.U./dL   Nitrite, UA Negative Negative   Leukocytes, UA Negative Negative     ASSESSMENT and PLAN  1. Pelvic pain Chronic, reassuring ct abd/pelvis. [place] to definitively evaluate adnexa. Given association with voiding discussed trial of time void. - POCT urinalysis dipstick - US PELVIC COMPLETE WITH TRANSVAGINAL; Future  2. Other specified hypothyroidism Checking labs today, medications will be adjusted as needed.  - TSH  3. Type 2 diabetes mellitus with diabetic polyneuropathy, without long-term current use of insulin ([place]) Checking labs today, medications will be adjusted as needed.  - Comprehensive metabolic panel - Hemoglobin A1c  4. CKD (chronic kidney disease), stage III ([place]) Checking labs today, medications will be adjusted as needed.  - CBC with Differential/Platelet - Comprehensive metabolic panel  5. Essential hypertension, benign Controlled. Continue current regime.  - Comprehensive metabolic panel - diltiazem (CARDIZEM CD) 360 MG 24 hr capsule; Take 1 capsule (360 mg total) by mouth daily.  6. Chronic diastolic CHF (congestive heart failure) ([place]) Controlled. Continue current regime.  - furosemide (LASIX) 40 MG tablet; Take 1 tablet (40 mg total) by mouth daily.  7. Generalized anxiety disorder [place] csr reviewed. Refilling today. Discuss at future visit, maintenance medication.  - clonazePAM (KLONOPIN) 1 MG tablet; TAKE 1/2 tab po qam AND 1 tab po qhs.  Advised patient to call PT office and set up appt for right shoulder pain.  Return in about 1 month (around 06/24/2018).    Rutherford Guys, MD Primary Care at [place], [place] [address] Ph.  [phone] Fax [phone]

## 2018-05-27 NOTE — Patient Instructions (Addendum)
[place], [place], [place] [address] [phone]   [facility]agining (pelvic pain - ultrasound) Call [phone]   IF you received an x-ray today, you will receive an invoice from [facility]. Please contact [facility] at [phone] with questions or concerns regarding your invoice.   IF you received labwork today, you will receive an invoice from [facility]. Please contact [facility] at [phone] with questions or concerns regarding your invoice.   Our billing staff will not be able to assist you with questions regarding bills from these companies.  You will be contacted with the lab results as soon as they are available. The fastest way to get your results is to activate your My Chart account. Instructions are located on the last page of this paperwork. If you have not heard from [place] regarding the results in 2 weeks, please contact this office.

## 2018-05-28 LAB — COMPREHENSIVE METABOLIC PANEL
ALT: 11 IU/L (ref 0–32)
AST: 14 IU/L (ref 0–40)
Albumin/Globulin Ratio: 1.4 (ref 1.2–2.2)
Albumin: 4.6 g/dL (ref 3.5–4.8)
Alkaline Phosphatase: 68 IU/L (ref 39–117)
BUN/Creatinine Ratio: 13 (ref 12–28)
BUN: 20 mg/dL (ref 8–27)
Bilirubin Total: 0.4 mg/dL (ref 0.0–1.2)
CO2: 26 mmol/L (ref 20–29)
Calcium: 10.4 mg/dL — ABNORMAL HIGH (ref 8.7–10.3)
Chloride: 102 mmol/L (ref 96–106)
Creatinine, Ser: 1.5 mg/dL — ABNORMAL HIGH (ref 0.57–1.00)
GFR calc Af Amer: 38 mL/min/{1.73_m2} — ABNORMAL LOW (ref >59)
GFR calc non Af Amer: 33 mL/min/{1.73_m2} — ABNORMAL LOW (ref >59)
Globulin, Total: 3.3 g/dL (ref 1.5–4.5)
Glucose: 136 mg/dL — ABNORMAL HIGH (ref 65–99)
Potassium: 5.1 mmol/L (ref 3.5–5.2)
Sodium: 143 mmol/L (ref 134–144)
Total Protein: 7.9 g/dL (ref 6.0–8.5)

## 2018-05-28 LAB — CBC WITH DIFFERENTIAL/PLATELET
Basophils Absolute: 0 10*3/uL (ref 0.0–0.2)
Basos: 1 %
EOS (ABSOLUTE): 0.2 10*3/uL (ref 0.0–0.4)
Eos: 3 %
Hematocrit: 41.6 % (ref 34.0–46.6)
Hemoglobin: 13.6 g/dL (ref 11.1–15.9)
Immature Grans (Abs): 0 10*3/uL (ref 0.0–0.1)
Immature Granulocytes: 0 %
Lymphocytes Absolute: 2.3 10*3/uL (ref 0.7–3.1)
Lymphs: 34 %
MCH: 31.9 pg (ref 26.6–33.0)
MCHC: 32.7 g/dL (ref 31.5–35.7)
MCV: 98 fL — ABNORMAL HIGH (ref 79–97)
Monocytes Absolute: 0.4 10*3/uL (ref 0.1–0.9)
Monocytes: 6 %
Neutrophils Absolute: 3.9 10*3/uL (ref 1.4–7.0)
Neutrophils: 56 %
Platelets: 208 10*3/uL (ref 150–450)
RBC: 4.26 x10E6/uL (ref 3.77–5.28)
RDW: 15.5 % — ABNORMAL HIGH (ref 12.3–15.4)
WBC: 6.9 10*3/uL (ref 3.4–10.8)

## 2018-05-28 LAB — TSH: TSH: 0.788 u[IU]/mL (ref 0.450–4.500)

## 2018-05-28 LAB — HEMOGLOBIN A1C
Est. average glucose Bld gHb Est-mCnc: 163 mg/dL
Hgb A1c MFr Bld: 7.3 % — ABNORMAL HIGH (ref 4.8–5.6)

## 2018-06-04 NOTE — Addendum Note (Signed)
Addended by: Rutherford Guys on: 06/04/2018 04:46 PM   Modules accepted: Orders

## 2018-06-06 ENCOUNTER — Other Ambulatory Visit: Payer: Medicare Other

## 2018-06-10 ENCOUNTER — Ambulatory Visit: Payer: Medicare Other

## 2018-06-10 DIAGNOSIS — N183 Chronic kidney disease, stage 3 unspecified: Secondary | ICD-10-CM

## 2018-06-11 LAB — PTH, INTACT AND CALCIUM
Calcium: 9.7 mg/dL (ref 8.7–10.3)
PTH: 126 pg/mL — ABNORMAL HIGH (ref 15–65)

## 2018-06-11 LAB — VITAMIN D 25 HYDROXY (VIT D DEFICIENCY, FRACTURES): Vit D, 25-Hydroxy: 23.9 ng/mL — ABNORMAL LOW (ref 30.0–100.0)

## 2018-06-11 LAB — PHOSPHORUS: Phosphorus: 2.9 mg/dL (ref 2.5–4.5)

## 2018-06-16 ENCOUNTER — Ambulatory Visit
Admission: RE | Admit: 2018-06-16 | Discharge: 2018-06-16 | Disposition: A | Discharge status: Home / Self Care | Payer: Medicare Other | Source: Ambulatory Visit | Attending: Family Medicine | Admitting: Family Medicine

## 2018-06-16 DIAGNOSIS — R102 Pelvic and perineal pain: Secondary | ICD-10-CM

## 2018-06-24 ENCOUNTER — Other Ambulatory Visit: Payer: Self-pay

## 2018-06-24 ENCOUNTER — Ambulatory Visit (INDEPENDENT_AMBULATORY_CARE_PROVIDER_SITE_OTHER): Payer: Medicare Other | Admitting: Family Medicine

## 2018-06-24 ENCOUNTER — Encounter: Payer: Self-pay | Admitting: Family Medicine

## 2018-06-24 VITALS — BP 138/80 | HR 63 | Temp 99.0°F | Ht 65.55 in | Wt 168.2 lb

## 2018-06-24 DIAGNOSIS — K644 Residual hemorrhoidal skin tags: Secondary | ICD-10-CM | POA: Diagnosis not present

## 2018-06-24 DIAGNOSIS — Z905 Acquired absence of kidney: Secondary | ICD-10-CM | POA: Insufficient documentation

## 2018-06-24 DIAGNOSIS — I10 Essential (primary) hypertension: Secondary | ICD-10-CM

## 2018-06-24 DIAGNOSIS — K5900 Constipation, unspecified: Secondary | ICD-10-CM | POA: Diagnosis not present

## 2018-06-24 DIAGNOSIS — R1030 Lower abdominal pain, unspecified: Secondary | ICD-10-CM | POA: Diagnosis not present

## 2018-06-24 DIAGNOSIS — N183 Chronic kidney disease, stage 3 unspecified: Secondary | ICD-10-CM

## 2018-06-24 MED ORDER — METOPROLOL TARTRATE 100 MG PO TABS: 100.0000 mg | ORAL_TABLET | Freq: Two times a day (BID) | ORAL | 2 refills | Status: DC

## 2018-06-24 NOTE — Patient Instructions (Signed)
1. Please monitor BP at home in the afternoon to ensure BP remains controlled with decrease of metoprolol from twice a day to once a day.

## 2018-06-24 NOTE — Progress Notes (Signed)
7/30/201910:04 AM  Sherri Burns 07-24-1936, 82 y.o. female [id number]  Chief Complaint  Patient presents with  . Follow-up    did transvaginal imaging on 05/27/18, needing new rx for metropolol. Want to talk about the medication she is taking.Thinks it is causing hair to thin.     HPI:   Patient is a 82 y.o. female with past medical history significant for DM2, CKD3, s/p nephrectomy, HTN, anxiety, hypothyroidism who presents today for routine followup  Transvaginal US - negative. Ovaries not seen. She has been having more lower abd/pelvic pain, increased constipation and has an external hemorrhoid that has been bothering her  She also has only one kidney, CKD3 and elevated PTH, not seeing renal  She would also like to discuss metoprolol. She feels better when she only takes it once a day, which she has been doing so for over a week.. She does check her BP at home and she reports it has been ok with change  Fall Risk  05/27/2018 05/13/2018 12/07/2017 10/03/2017 09/26/2017  Falls in the past year? No No No No No  Number falls in past yr: - - - - -  Injury with Fall? - - - - -     Depression screen [facility] 2/9 05/27/2018 05/13/2018 12/07/2017  Decreased Interest 0 0 1  Down, Depressed, Hopeless 0 0 3  PHQ - 2 Score 0 0 4  Altered sleeping - - 2  Tired, decreased energy - - 3  Change in appetite - - 2  Feeling bad or failure about yourself  - - 3  Trouble concentrating - - 2  Moving slowly or fidgety/restless - - 1  Suicidal thoughts - - 1  PHQ-9 Score - - 18  Difficult doing work/chores - - Somewhat difficult  Some recent data might be hidden    Allergies  Allergen Reactions  . Codeine Other (See Comments)    hallucinations  . Metformin And Related     Upset stomach  . Ciprocin-Fluocin-Procin [Fluocinolone Acetonide] Other (See Comments)    Unknown reaction  . Clonidine Derivatives Other (See Comments)    Dry mouth  . Crestor [Rosuvastatin Calcium] Other (See Comments)   cramps  . Penicillins Other (See Comments)    Has patient had a PCN reaction causing immediate rash, facial/tongue/throat swelling, SOB or lightheadedness with hypotension: no Has patient had a PCN reaction causing severe rash involving mucus membranes or skin necrosis: no Has patient had a PCN reaction that required hospitalization : no Has patient had a PCN reaction occurring within the last 10 years: no -Hallucinates If all of the above answers are "NO", then may proceed with Cephalosporin use.   . Simvastatin Other (See Comments)    Upset stomach   . Tessalon Perles Other (See Comments)    Gi upset     Prior to Admission medications   Medication Sig Start Date End Date Taking? Authorizing Provider  acetaminophen (TYLENOL) 500 MG tablet Take 500 mg by mouth every 6 (six) hours as needed for mild pain.    [provider]  cholecalciferol (VITAMIN D) 1000 units tablet Take 1,000 Units by mouth daily.    [provider]  clonazePAM (KLONOPIN) 1 MG tablet TAKE 1/2 tab po qam AND 1 tab po qhs. 05/27/18   Rutherford Guys, MD  Colloidal Oatmeal (ECZEMA MOISTURIZING) 1 % LOTN Apply 1 application topically as needed. Eczema cream    [provider]  diclofenac sodium (VOLTAREN) 1 % GEL Apply  2 g topically 4 (four) times daily. 05/13/18   Rutherford Guys, MD  diltiazem (CARDIZEM CD) 360 MG 24 hr capsule Take 1 capsule (360 mg total) by mouth daily. 05/27/18   Rutherford Guys, MD  fluticasone (FLONASE) 50 MCG/ACT nasal spray PLACE 1 SPRAY INTO BOTH NOSTRILS DAILY Patient taking differently: Place 1 spray into both nostrils at bedtime.  01/10/18   Shawnee Knapp, MD  furosemide (LASIX) 40 MG tablet Take 1 tablet (40 mg total) by mouth daily. 05/27/18   Rutherford Guys, MD  glipiZIDE (GLUCOTROL) 10 MG tablet TAKE TWO TABLETS BY MOUTH TWICE A DAY 30 MIN BEFORE A MEAL **NEED OFFICE VISIT FOR ADDITIONAL REFILLS** 05/26/18   Shawnee Knapp, MD  levothyroxine (SYNTHROID, LEVOTHROID) 88  MCG tablet Take 1 tablet (88 mcg total) by mouth daily before breakfast. NEED OFFICE VISIT FOR ANY ADDITIONAL REFILLS 03/14/18   Shawnee Knapp, MD  metoprolol tartrate (LOPRESSOR) 100 MG tablet Take 1 tablet (100 mg total) by mouth 2 (two) times daily. **NEED OFFICE VISIT FOR ADDITIONAL REFILLS** 03/14/18   Shawnee Knapp, MD  neomycin-polymyxin-dexameth (MAXITROL) 0.1 % OINT 1 application as needed. In her hear when needed for itching    [provider]  omeprazole (PRILOSEC) 40 MG capsule Take 1 capsule (40 mg total) by mouth daily. NEED OFFICE VISIT FOR ANY ADDITIONAL REFILLS 03/14/18   Shawnee Knapp, MD  polyethylene glycol [facility] / Floria Raveling) packet Take 8.5 g by mouth every morning.     [provider]  vitamin E 100 UNIT capsule Take 100 Units by mouth daily.    [provider]    Past Medical History:  Diagnosis Date  . Abnormal EKG 02/07/2016   Inferolateral T wave inversion and ST depression.  . Acute calculous cholecystitis   . Anxiety   . Arthritis   . Chronic diastolic CHF (congestive heart failure) ([place]) 02/21/2018  . Colon polyps 11/2008   tubular adenoma  . Diabetes mellitus   . Esophageal problem    esophageal dilation  . GERD (gastroesophageal reflux disease)    + hpylori  . GI bleeding 01/2018  . Headache   . Hyperlipidemia   . Hypertension   . Hypothyroidism   . Ischemic colitis ([place])   . Renal cancer, right ([place]) 2010   s/p Rt nephrectomy by Dr. Rosana Hoes  . Renal insufficiency     Past Surgical History:  Procedure Laterality Date  . ABDOMINAL HYSTERECTOMY  1978   partial  . CHOLECYSTECTOMY N/A 04/04/2016   Procedure: LAPAROSCOPIC CHOLECYSTECTOMY;  Surgeon: Autumn Messing III, MD;  Location: [facility];  Service: General;  Laterality: N/A;  . COLONOSCOPY    . ESOPHAGEAL DILATION  2016  . HERNIA REPAIR    . LAPAROSCOPIC CHOLECYSTECTOMY  04/04/2016  . NEPHRECTOMY Right 2010   Dr. Rosana Hoes, urology, due to renal cancer    Social History   Tobacco Use    . Smoking status: Current Some Day Smoker    Packs/day: 0.25    Years: 55.00    Pack years: 13.75    Types: Cigarettes  . Smokeless tobacco: Never Used  . Tobacco comment: cut back amount still  trying to quit  Substance Use Topics  . Alcohol use: No    Alcohol/week: 0.0 oz    Family History  Problem Relation Age of Onset  . Cancer Mother        colon, kidney cancer  . Cancer Father  throat cancer  . Cancer Sister   . Heart attack Brother     Review of Systems  Constitutional: Negative for chills and fever.  Respiratory: Negative for cough and shortness of breath.   Cardiovascular: Negative for chest pain, palpitations and leg swelling.  Gastrointestinal: Positive for abdominal pain and constipation. Negative for blood in stool, melena, nausea and vomiting.   Per hpi  OBJECTIVE:  Blood pressure 138/80, pulse 63, temperature 99 F (37.2 C), temperature source Oral, height 5' 5.55" (1.665 m), weight 168 lb 3.2 oz (76.3 kg), SpO2 97 %. Body mass index is 27.52 kg/m.   Physical Exam  Constitutional: She is oriented to person, place, and time. She appears well-developed and well-nourished.  HENT:  Head: Normocephalic and atraumatic.  Mouth/Throat: Oropharynx is clear and moist. No oropharyngeal exudate.  Eyes: Pupils are equal, round, and reactive to light. EOM are normal. No scleral icterus.  Neck: Neck supple.  Cardiovascular: Normal rate, regular rhythm and normal heart sounds. Exam reveals no gallop and no friction rub.  No murmur heard. Pulmonary/Chest: Effort normal and breath sounds normal. She has no wheezes. She has no rales.  Abdominal: Soft. Bowel sounds are normal. She exhibits no distension and no mass. There is tenderness (RLQ, LLQ). There is no rebound and no guarding.  Musculoskeletal: She exhibits no edema.  Neurological: She is alert and oriented to person, place, and time.  Skin: Skin is warm and dry.  Psychiatric: She has a normal mood and  affect.  Nursing note and vitals reviewed.   ASSESSMENT and PLAN  1. Lower abdominal pain - Ambulatory referral to Gastroenterology  2. Constipation, unspecified constipation type - Ambulatory referral to Gastroenterology  3. External hemorrhoid - Ambulatory referral to Gastroenterology  4. Essential hypertension Controlled. Will cont with once a day metoprolol. Discuss BP goal.   5. CKD (chronic kidney disease), stage III ([place]) - Ambulatory referral to Nephrology  6. S/p nephrectomy - Ambulatory referral to Nephrology   Return in about 3 months (around 09/24/2018).    Rutherford Guys, MD Primary Care at [address] [place], [place] [address] Ph.  [phone] Fax [phone]

## 2018-07-01 ENCOUNTER — Other Ambulatory Visit: Payer: Self-pay | Admitting: Family Medicine

## 2018-07-01 DIAGNOSIS — R601 Generalized edema: Secondary | ICD-10-CM

## 2018-07-01 DIAGNOSIS — E038 Other specified hypothyroidism: Secondary | ICD-10-CM

## 2018-07-14 ENCOUNTER — Other Ambulatory Visit: Payer: Self-pay | Admitting: Family Medicine

## 2018-07-14 NOTE — Telephone Encounter (Signed)
pls advise

## 2018-07-14 NOTE — Telephone Encounter (Signed)
Klonopin refill Last Refill:06/30/18 #45 Last OV: 06/24/18 PCP: Dr. Brigitte Pulse  Diclofenac Sodium % gel Last refill: 06/30/18

## 2018-07-18 ENCOUNTER — Other Ambulatory Visit: Payer: Self-pay | Admitting: Family Medicine

## 2018-07-24 ENCOUNTER — Other Ambulatory Visit: Payer: Self-pay | Admitting: Family Medicine

## 2018-07-24 DIAGNOSIS — I10 Essential (primary) hypertension: Secondary | ICD-10-CM

## 2018-07-24 NOTE — Telephone Encounter (Signed)
[employer] called and spoke to [place], [facility] about the refill request, he says the patient received the refill sent on 05/27/18 #90 and should have 1 month left. I called the patient, unable to leave VM due to mailbox full. Prescription refill denied.

## 2018-07-31 ENCOUNTER — Other Ambulatory Visit: Payer: Self-pay | Admitting: Family Medicine

## 2018-07-31 DIAGNOSIS — I5032 Chronic diastolic (congestive) heart failure: Secondary | ICD-10-CM

## 2018-07-31 DIAGNOSIS — I10 Essential (primary) hypertension: Secondary | ICD-10-CM

## 2018-07-31 NOTE — Telephone Encounter (Signed)
Patient is requesting a refill of the following medications: Requested Prescriptions   Pending Prescriptions Disp Refills  . clonazePAM (KLONOPIN) 1 MG tablet [Pharmacy Med Name: CLONAZEPAM 1MG  TAB] 45 tablet 0    Sig: TAKE 1/2 TABLETS BY MOUTH EVERY MORNING AND 1 TABLET BY MOUTH AT BEDTIME  . diltiazem (CARDIZEM CD) 360 MG 24 hr capsule [Pharmacy Med Name: DILTIAZEM HCL ER 360MG  CER] 90 capsule 0    Sig: TAKE ONE CAPSULE BY MOUTH DAILY  . furosemide (LASIX) 40 MG tablet [Pharmacy Med Name: FUROSEMIDE 40MG  TAB] 90 tablet 0    Sig: TAKE ONE TABLET BY MOUTH DAILY    Please advise

## 2018-07-31 NOTE — Telephone Encounter (Signed)
Klonopin 1  mg refill Last Refill:05/27/18 # 45 Last OV: 06/24/18 PCP: [place]  Cardizem CD refill Last Refill:05/27/18 # 90 Last OV: 06/24/18 PCP: Pamella Pert [facility]acy:[facility]  Lasix 40 mg refill Last Refill:  05/27/18  #90  Has an appt coming up with Dr. Pamella Pert on 09/30/18.  Returned due to a controlled substance.

## 2018-08-02 ENCOUNTER — Other Ambulatory Visit: Payer: Self-pay | Admitting: Family Medicine

## 2018-08-09 NOTE — Telephone Encounter (Signed)
FYI - pt has appt w/ Pamella Pert on 11/5 - if she is taking lasix every day will need either earlier appt or will need additional refill to make it to appt - last Cr bumped slightly to 1.5 w/ eGFR 38 from prior baseline Cr 1.2-1.4 with eGFR 42-48.  I have utilized the [place] Controlled Substance Registry's online query to confirm compliance regarding the patient's controlled medications. My review reveals appropriate prescription fills and that PCP is the sole provider of these medications. She had #60 clonazepam written by myself on 3/27 filled 3/27 and 5/9 and #45 written by [place] on 7/2 filled 7/2 and 8/5. I sent in an additional 1 mo of #45 on 9/14 - again will either need additional refill or earlier appt.

## 2018-08-11 NOTE — Telephone Encounter (Signed)
Please schedule as her kidney function needs to be monitored. thanks

## 2018-08-21 ENCOUNTER — Other Ambulatory Visit: Payer: Self-pay | Admitting: Family Medicine

## 2018-08-21 DIAGNOSIS — E038 Other specified hypothyroidism: Secondary | ICD-10-CM

## 2018-08-21 DIAGNOSIS — R601 Generalized edema: Secondary | ICD-10-CM

## 2018-08-21 NOTE — Telephone Encounter (Signed)
Refill req for Levothyroxine and Omeprezole sent to Dr. Brigitte Pulse

## 2018-08-22 NOTE — Telephone Encounter (Signed)
Patient's concern/request has been addressed already by another provider or staff member. 

## 2018-08-22 NOTE — Telephone Encounter (Signed)
Pt has an appt w/ Pamella Pert her PCP sched for 9/30 for additional refills.

## 2018-08-25 ENCOUNTER — Ambulatory Visit (INDEPENDENT_AMBULATORY_CARE_PROVIDER_SITE_OTHER): Payer: Medicare Other | Admitting: Family Medicine

## 2018-08-25 ENCOUNTER — Encounter: Payer: Self-pay | Admitting: Family Medicine

## 2018-08-25 ENCOUNTER — Other Ambulatory Visit: Payer: Self-pay

## 2018-08-25 VITALS — BP 136/84 | HR 66 | Temp 98.6°F | Resp 18 | Ht 65.91 in | Wt 168.6 lb

## 2018-08-25 DIAGNOSIS — N898 Other specified noninflammatory disorders of vagina: Secondary | ICD-10-CM | POA: Diagnosis not present

## 2018-08-25 DIAGNOSIS — R197 Diarrhea, unspecified: Secondary | ICD-10-CM

## 2018-08-25 DIAGNOSIS — N183 Chronic kidney disease, stage 3 unspecified: Secondary | ICD-10-CM

## 2018-08-25 DIAGNOSIS — R1030 Lower abdominal pain, unspecified: Secondary | ICD-10-CM | POA: Diagnosis not present

## 2018-08-25 DIAGNOSIS — R350 Frequency of micturition: Secondary | ICD-10-CM | POA: Diagnosis not present

## 2018-08-25 DIAGNOSIS — E1142 Type 2 diabetes mellitus with diabetic polyneuropathy: Secondary | ICD-10-CM

## 2018-08-25 DIAGNOSIS — K219 Gastro-esophageal reflux disease without esophagitis: Secondary | ICD-10-CM

## 2018-08-25 DIAGNOSIS — I10 Essential (primary) hypertension: Secondary | ICD-10-CM

## 2018-08-25 DIAGNOSIS — E038 Other specified hypothyroidism: Secondary | ICD-10-CM

## 2018-08-25 DIAGNOSIS — Z905 Acquired absence of kidney: Secondary | ICD-10-CM

## 2018-08-25 DIAGNOSIS — I5032 Chronic diastolic (congestive) heart failure: Secondary | ICD-10-CM

## 2018-08-25 LAB — POCT URINALYSIS DIP (MANUAL ENTRY)
Bilirubin, UA: NEGATIVE
Glucose, UA: NEGATIVE mg/dL
Ketones, POC UA: NEGATIVE mg/dL
Nitrite, UA: NEGATIVE
Spec Grav, UA: 1.025 (ref 1.010–1.025)
Urobilinogen, UA: 0.2 E.U./dL
pH, UA: 6 (ref 5.0–8.0)

## 2018-08-25 LAB — POC MICROSCOPIC URINALYSIS (UMFC): Mucus: ABSENT

## 2018-08-25 LAB — POCT CBC
Granulocyte percent: 51.6 %G (ref 37–80)
HCT, POC: 37.5 % — AB (ref 37.7–47.9)
Hemoglobin: 12.5 g/dL (ref 12.2–16.2)
Lymph, poc: 2.6 (ref 0.6–3.4)
MCH, POC: 31.3 pg — AB (ref 27–31.2)
MCHC: 33.3 g/dL (ref 31.8–35.4)
MCV: 94 fL (ref 80–97)
MID (cbc): 1.4 — AB (ref 0–0.9)
MPV: 7.6 fL (ref 0–99.8)
POC Granulocyte: 4.2 (ref 2–6.9)
POC LYMPH PERCENT: 31.4 %L (ref 10–50)
POC MID %: 17 %M — AB (ref 0–12)
Platelet Count, POC: 204 10*3/uL (ref 142–424)
RBC: 3.98 M/uL — AB (ref 4.04–5.48)
RDW, POC: 15.4 %
WBC: 8.2 10*3/uL (ref 4.6–10.2)

## 2018-08-25 LAB — POCT WET + KOH PREP
Trich by wet prep: ABSENT
Yeast by KOH: ABSENT
Yeast by wet prep: ABSENT

## 2018-08-25 MED ORDER — OMEPRAZOLE 40 MG PO CPDR: 40.0000 mg | DELAYED_RELEASE_CAPSULE | Freq: Every day | ORAL | 1 refills | Status: DC

## 2018-08-25 MED ORDER — CLONAZEPAM 1 MG PO TABS: ORAL_TABLET | ORAL | 3 refills | Status: DC

## 2018-08-25 MED ORDER — CIPROFLOXACIN HCL 500 MG PO TABS: 500.0000 mg | ORAL_TABLET | Freq: Two times a day (BID) | ORAL | 0 refills | Status: DC

## 2018-08-25 MED ORDER — GLIPIZIDE 10 MG PO TABS: ORAL_TABLET | ORAL | 5 refills | Status: DC

## 2018-08-25 MED ORDER — FUROSEMIDE 40 MG PO TABS: 40.0000 mg | ORAL_TABLET | Freq: Every day | ORAL | 1 refills | Status: DC

## 2018-08-25 MED ORDER — LEVOTHYROXINE SODIUM 88 MCG PO TABS: 88.0000 ug | ORAL_TABLET | Freq: Every day | ORAL | 1 refills | Status: DC

## 2018-08-25 MED ORDER — METOPROLOL TARTRATE 100 MG PO TABS: 100.0000 mg | ORAL_TABLET | Freq: Two times a day (BID) | ORAL | 1 refills | Status: DC

## 2018-08-25 MED ORDER — DICLOFENAC SODIUM 1 % TD GEL: TRANSDERMAL | 1 refills | Status: DC

## 2018-08-25 MED ORDER — DILTIAZEM HCL ER COATED BEADS 360 MG PO CP24: 360.0000 mg | ORAL_CAPSULE | Freq: Every day | ORAL | 1 refills | Status: DC

## 2018-08-25 NOTE — Progress Notes (Signed)
9/30/201910:34 AM  Sherri Burns 1936-02-02, 82 y.o. female [id number]  Chief Complaint  Patient presents with  . Hypertension    f/u  . Abdominal Pain    X 2 weeks- with diarrhea and urinary frequency  . Medication Refill    all    HPI:   Patient is a 82 y.o. female with past medical history significant for DM2, CKD3, s/p nephrectomy, HTN, anxiety, hypothyroidism  who presents today for routine followup  About 2 weeks of stomach indigestion, clear runny diarrhea, 3- 4 x day No nausea or vomiting Lower abd pain Cold all the time but no fevers No blood in stool + sick contacts Has stopped miralax Has h/o diverituliosis  Last calcium was normal, PTH mildly elevated, vitamin D slightly low  Recently got over yeast infection Still having a little bit of vaginal drainage, gunky white Has had sexual intercourse with new partner recently Dysuria and urinary frequency for past several day  Taking meds for chronic conditions as prescribed Requesting refills of all her meds PMP reviewed  Lab Results  Component Value Date   HGBA1C 7.3 (H) 05/27/2018   HGBA1C 8.0 09/26/2017   HGBA1C CANCELED 02/23/2017   Lab Results  Component Value Date   MICROALBUR 0.7 10/02/2016   LDLCALC 131 (H) 02/23/2017   CREATININE 1.50 (H) 05/27/2018    Fall Risk  08/25/2018 05/27/2018 05/13/2018 12/07/2017 10/03/2017  Falls in the past year? No No No No No  Number falls in past yr: - - - - -  Injury with Fall? - - - - -     Depression screen [facility] 2/9 08/25/2018 05/27/2018 05/13/2018  Decreased Interest 0 0 0  Down, Depressed, Hopeless 0 0 0  PHQ - 2 Score 0 0 0  Altered sleeping - - -  Tired, decreased energy - - -  Change in appetite - - -  Feeling bad or failure about yourself  - - -  Trouble concentrating - - -  Moving slowly or fidgety/restless - - -  Suicidal thoughts - - -  PHQ-9 Score - - -  Difficult doing work/chores - - -  Some recent data might be hidden    Allergies    Allergen Reactions  . Codeine Other (See Comments)    hallucinations  . Metformin And Related     Upset stomach  . Ciprocin-Fluocin-Procin [Fluocinolone Acetonide] Other (See Comments)    Unknown reaction  . Clonidine Derivatives Other (See Comments)    Dry mouth  . Crestor [Rosuvastatin Calcium] Other (See Comments)    cramps  . Penicillins Other (See Comments)    Has patient had a PCN reaction causing immediate rash, facial/tongue/throat swelling, SOB or lightheadedness with hypotension: no Has patient had a PCN reaction causing severe rash involving mucus membranes or skin necrosis: no Has patient had a PCN reaction that required hospitalization : no Has patient had a PCN reaction occurring within the last 10 years: no -Hallucinates If all of the above answers are "NO", then may proceed with Cephalosporin use.   . Simvastatin Other (See Comments)    Upset stomach   . Tessalon Perles Other (See Comments)    Gi upset     Prior to Admission medications   Medication Sig Start Date End Date Taking? Authorizing Provider  acetaminophen (TYLENOL) 500 MG tablet Take 500 mg by mouth every 6 (six) hours as needed for mild pain.   Yes [provider]  cholecalciferol (VITAMIN D) 1000 units tablet  Take 1,000 Units by mouth daily.   Yes [provider]  clonazePAM (KLONOPIN) 1 MG tablet TAKE 1/2 TAB po qam AND 1 TAB po qhs. **NEEDS OFFICE VISIT FOR ADDITIONAL REFILLS** 08/09/18  Yes Shawnee Knapp, MD  Colloidal Oatmeal (ECZEMA MOISTURIZING) 1 % LOTN Apply 1 application topically as needed. Eczema cream   Yes [provider]  diclofenac sodium (VOLTAREN) 1 % GEL APPLY TWO GRAMS TOPICALLY 4 (FOUR) TIMES DAILY. 07/15/18  Yes Rutherford Guys, MD  diltiazem (CARDIZEM CD) 360 MG 24 hr capsule TAKE ONE CAPSULE BY MOUTH DAILY 08/09/18  Yes Shawnee Knapp, MD  fluticasone (FLONASE) 50 MCG/ACT nasal spray PLACE 1 SPRAY INTO BOTH NOSTRILS DAILY Patient taking differently: Place 1  spray into both nostrils at bedtime.  01/10/18  Yes Shawnee Knapp, MD  furosemide (LASIX) 40 MG tablet TAKE ONE TABLET BY MOUTH DAILY 08/09/18  Yes Shawnee Knapp, MD  glipiZIDE (GLUCOTROL) 10 MG tablet TAKE TWO TABLETS BY MOUTH TWICE A DAY 30 MIN BEFORE A MEAL 07/14/18  Yes Rutherford Guys, MD  levothyroxine (SYNTHROID, LEVOTHROID) 88 MCG tablet TAKE ONE TABLET BY MOUTH DAILY BEFORE BREAKFAST 08/21/18  Yes Rutherford Guys, MD  metoprolol tartrate (LOPRESSOR) 100 MG tablet Take 1 tablet (100 mg total) by mouth 2 (two) times daily. 06/24/18  Yes Rutherford Guys, MD  neomycin-polymyxin-dexameth (MAXITROL) 0.1 % OINT 1 application as needed. In her hear when needed for itching   Yes [provider]  omeprazole (PRILOSEC) 40 MG capsule TAKE ONE CAPSULE BY MOUTH DAILY *NEED OFFICE VISIT FOR FURTHER REFILLS* 08/21/18  Yes Rutherford Guys, MD  polyethylene glycol (MIRALAX / GLYCOLAX) packet Take 8.5 g by mouth every morning.    Yes [provider]  vitamin E 100 UNIT capsule Take 100 Units by mouth daily.   Yes [provider]    Past Medical History:  Diagnosis Date  . Abnormal EKG 02/07/2016   Inferolateral T wave inversion and ST depression.  . Acute calculous cholecystitis   . Anxiety   . Arthritis   . Chronic diastolic CHF (congestive heart failure) ([place]) 02/21/2018  . Colon polyps 11/2008   tubular adenoma  . Diabetes mellitus   . Esophageal problem    esophageal dilation  . GERD (gastroesophageal reflux disease)    + hpylori  . GI bleeding 01/2018  . Headache   . Hyperlipidemia   . Hypertension   . Hypothyroidism   . Ischemic colitis ([place])   . Renal cancer, right ([place]) 2010   s/p Rt nephrectomy by Dr. Rosana Hoes  . Renal insufficiency     Past Surgical History:  Procedure Laterality Date  . ABDOMINAL HYSTERECTOMY  1978   partial  . CHOLECYSTECTOMY N/A 04/04/2016   Procedure: LAPAROSCOPIC CHOLECYSTECTOMY;  Surgeon: Autumn Messing III, MD;  Location: [place];  Service:  General;  Laterality: N/A;  . COLONOSCOPY    . ESOPHAGEAL DILATION  2016  . HERNIA REPAIR    . LAPAROSCOPIC CHOLECYSTECTOMY  04/04/2016  . NEPHRECTOMY Right 2010   Dr. Rosana Hoes, urology, due to renal cancer    Social History   Tobacco Use  . Smoking status: Current Some Day Smoker    Packs/day: 0.25    Years: 55.00    Pack years: 13.75    Types: Cigarettes  . Smokeless tobacco: Never Used  . Tobacco comment: cut back amount still  trying to quit  Substance Use Topics  . Alcohol use: No  Alcohol/week: 0.0 standard drinks    Family History  Problem Relation Age of Onset  . Cancer Mother        colon, kidney cancer  . Cancer Father        throat cancer  . Cancer Sister   . Heart attack Brother     ROS Per hpi  OBJECTIVE:  Blood pressure (!) 157/82, pulse 66, temperature 98.6 F (37 C), temperature source Oral, resp. rate 18, height 5' 5.91" (1.674 m), weight 168 lb 9.6 oz (76.5 kg). Body mass index is 27.29 kg/m.   Repeat BP 136/84  Physical Exam  Constitutional: She is oriented to person, place, and time. She appears well-developed and well-nourished.  HENT:  Head: Normocephalic and atraumatic.  Mouth/Throat: Oropharynx is clear and moist. No oropharyngeal exudate.  Eyes: Pupils are equal, round, and reactive to light. Conjunctivae and EOM are normal. No scleral icterus.  Neck: Neck supple.  Cardiovascular: Normal rate, regular rhythm and normal heart sounds. Exam reveals no gallop and no friction rub.  No murmur heard. Pulmonary/Chest: Effort normal and breath sounds normal. She has no wheezes. She has no rales.  Abdominal: Soft. Bowel sounds are normal. There is tenderness in the right lower quadrant, suprapubic area and left lower quadrant.  Genitourinary:  Genitourinary Comments: Vulva wo lesions Vagina wo erythema scant clear thin white mucous Cervix/uterus/adnexa surgically absent  Musculoskeletal: She exhibits no edema.  Neurological: She is alert and  oriented to person, place, and time.  Skin: Skin is warm and dry.  Psychiatric: She has a normal mood and affect.  Nursing note and vitals reviewed.   Results for orders placed or performed in visit on 08/25/18 (from the past 24 hour(s))  POCT urinalysis dipstick     Status: Abnormal   Collection Time: 08/25/18 10:01 AM  Result Value Ref Range   Color, UA yellow yellow   Clarity, UA cloudy (A) clear   Glucose, UA negative negative mg/dL   Bilirubin, UA negative negative   Ketones, POC UA negative negative mg/dL   Spec Grav, UA 1.025 1.010 - 1.025   Blood, UA trace-lysed (A) negative   pH, UA 6.0 5.0 - 8.0   Protein Ur, POC trace (A) negative mg/dL   Urobilinogen, UA 0.2 0.2 or 1.0 E.U./dL   Nitrite, UA Negative Negative   Leukocytes, UA Small (1+) (A) Negative  POCT Microscopic Urinalysis ([facility])     Status: Abnormal   Collection Time: 08/25/18 11:06 AM  Result Value Ref Range   WBC,UR,HPF,POC Many (A) None WBC/hpf   RBC,UR,HPF,POC None None RBC/hpf   Bacteria Many (A) None, Too numerous to count   Mucus Absent Absent   Epithelial Cells, UR Per Microscopy Moderate (A) None, Too numerous to count cells/hpf  POCT Wet + KOH Prep     Status: Abnormal   Collection Time: 08/25/18 11:15 AM  Result Value Ref Range   Yeast by KOH Absent Absent   Yeast by wet prep Absent Absent   WBC by wet prep Many (A) Few   Clue Cells Wet Prep HPF POC Few (A) None   Trich by wet prep Absent Absent   Bacteria Wet Prep HPF POC Many (A) Few   Epithelial Cells By [employer] Pref ([facility]) Many (A) None, Few, Too numerous to count   RBC,UR,HPF,POC None None RBC/hpf    No results found.   ASSESSMENT and PLAN  1. Lower abdominal pain Unclear etiology exacerbation of chronic pain with current sx of UTI and diarrhea.  Using cipro as might be component of diverticulitis present.  - POCT urinalysis dipstick - Comprehensive metabolic panel - TSH - POCT CBC  2. Urinary frequency - POCT Microscopic Urinalysis  ([facility]) - Urine Culture  3. Vaginal discharge Wet prep does not indicate specific infection - POCT Wet + KOH Prep  4. Diarrhea, unspecified type See #1 - Comprehensive metabolic panel - TSH - POCT CBC  5. Type 2 diabetes mellitus with diabetic polyneuropathy, without long-term current use of insulin ([place]) Checking labs today, medications will be adjusted as needed.  - Hemoglobin A1c  6. Essential hypertension, benign Controlled. Continue current regime.  - diltiazem (CARDIZEM CD) 360 MG 24 hr capsule; Take 1 capsule (360 mg total) by mouth daily.  7. Chronic diastolic CHF (congestive heart failure) ([place]) Controlled. Continue current regime.  - furosemide (LASIX) 40 MG tablet; Take 1 tablet (40 mg total) by mouth daily.  8. Other specified hypothyroidism Checking labs today, medications will be adjusted as needed.  - levothyroxine (SYNTHROID, LEVOTHROID) 88 MCG tablet; Take 1 tablet (88 mcg total) by mouth daily before breakfast.  9. CKD (chronic kidney disease), stage III ([place]) - Ambulatory referral to Nephrology  10. S/p nephrectomy - Ambulatory referral to Nephrology  11. Gastroesophageal reflux disease without esophagitis - omeprazole (PRILOSEC) 40 MG capsule; Take 1 capsule (40 mg total) by mouth daily.  Other orders - Flu vaccine HIGH DOSE PF (Fluzone High dose) - ciprofloxacin (CIPRO) 500 MG tablet; Take 1 tablet (500 mg total) by mouth 2 (two) times daily. - diclofenac sodium (VOLTAREN) 1 % GEL; APPLY TWO GRAMS TOPICALLY 4 (FOUR) TIMES DAILY. - glipiZIDE (GLUCOTROL) 10 MG tablet; TAKE TWO TABLETS BY MOUTH TWICE A DAY 30 MIN BEFORE A MEAL - metoprolol tartrate (LOPRESSOR) 100 MG tablet; Take 1 tablet (100 mg total) by mouth 2 (two) times daily. - clonazePAM (KLONOPIN) 1 MG tablet; TAKE 1/2 TAB po qam AND 1 TAB po qhs.    Return in about 3 months (around 11/24/2018) for HTN and DM2.    Rutherford Guys, MD Primary Care at [address], [place]  [address] Ph.  [phone] Fax [phone]

## 2018-08-25 NOTE — Patient Instructions (Addendum)
If you have lab work done today you will be contacted with your lab results within the next 2 weeks.  If you have not heard from [place] then please contact us. The fastest way to get your results is to register for My Chart.   IF you received an x-ray today, you will receive an invoice from [facility]. Please contact [facility] at [phone] with questions or concerns regarding your invoice.   IF you received labwork today, you will receive an invoice from [place]. Please contact [facility] at [phone] with questions or concerns regarding your invoice.   Our billing staff will not be able to assist you with questions regarding bills from these companies.  You will be contacted with the lab results as soon as they are available. The fastest way to get your results is to activate your My Chart account. Instructions are located on the last page of this paperwork. If you have not heard from [place] regarding the results in 2 weeks, please contact this office.     Food Choices to Help Relieve Diarrhea, Adult When you have diarrhea, the foods you eat and your eating habits are very important. Choosing the right foods and drinks can help:  Relieve diarrhea.  Replace lost fluids and nutrients.  Prevent dehydration.  What general guidelines should I follow? Relieving diarrhea  Choose foods with less than 2 g or .07 oz. of fiber per serving.  Limit fats to less than 8 tsp (38 g or 1.34 oz.) a day.  Avoid the following: ? Foods and beverages sweetened with high-fructose corn syrup, honey, or sugar alcohols such as xylitol, sorbitol, and mannitol. ? Foods that contain a lot of fat or sugar. ? Fried, greasy, or spicy foods. ? High-fiber grains, breads, and cereals. ? Raw fruits and vegetables.  Eat foods that are rich in probiotics. These foods include dairy products such as yogurt and fermented milk products. They help increase healthy bacteria in the stomach and  intestines (gastrointestinal tract, or GI tract).  If you have lactose intolerance, avoid dairy products. These may make your diarrhea worse.  Take medicine to help stop diarrhea (antidiarrheal medicine) only as told by your health care provider. Replacing nutrients  Eat small meals or snacks every 3-4 hours.  Eat bland foods, such as white rice, toast, or baked potato, until your diarrhea starts to get better. Gradually reintroduce nutrient-rich foods as tolerated or as told by your health care provider. This includes: ? Well-cooked protein foods. ? Peeled, seeded, and soft-cooked fruits and vegetables. ? Low-fat dairy products.  Take vitamin and mineral supplements as told by your health care provider. Preventing dehydration   Start by sipping water or a special solution to prevent dehydration (oral rehydration solution, ORS). Urine that is clear or pale yellow means that you are getting enough fluid.  Try to drink at least 8-10 cups of fluid each day to help replace lost fluids.  You may add other liquids in addition to water, such as clear juice or decaffeinated sports drinks, as tolerated or as told by your health care provider.  Avoid drinks with caffeine, such as coffee, tea, or soft drinks.  Avoid alcohol. What foods are recommended? The items listed may not be a complete list. Talk with your health care provider about what dietary choices are best for you. Grains White rice. White, [place], or pita breads (fresh or toasted), including plain rolls, buns, or bagels. White pasta. Saltine, soda, or graham crackers.  Pretzels. Low-fiber cereal. Cooked cereals made with water (such as cornmeal, farina, or cream cereals). Plain muffins. Matzo. Melba toast. Zwieback. Vegetables Potatoes (without the skin). Most well-cooked and canned vegetables without skins or seeds. Tender lettuce. Fruits Apple sauce. Fruits canned in juice. Cooked apricots, cherries, grapefruit, peaches, pears, or  plums. Fresh bananas and cantaloupe. Meats and other protein foods Baked or boiled chicken. Eggs. Tofu. Fish. Seafood. Smooth nut butters. Ground or well-cooked tender beef, ham, veal, lamb, pork, or poultry. Dairy Plain yogurt, kefir, and unsweetened liquid yogurt. Lactose-free milk, buttermilk, skim milk, or soy milk. Low-fat or nonfat hard cheese. Beverages Water. Low-calorie sports drinks. Fruit juices without pulp. Strained tomato and vegetable juices. Decaffeinated teas. Sugar-free beverages not sweetened with sugar alcohols. Oral rehydration solutions, if approved by your health care provider. Seasoning and other foods Bouillon, broth, or soups made from recommended foods. What foods are not recommended? The items listed may not be a complete list. Talk with your health care provider about what dietary choices are best for you. Grains Whole grain, whole wheat, bran, or rye breads, rolls, pastas, and crackers. Wild or brown rice. Whole grain or bran cereals. Barley. Oats and oatmeal. Corn tortillas or taco shells. Granola. Popcorn. Vegetables Raw vegetables. Fried vegetables. Cabbage, broccoli, Brussels sprouts, artichokes, baked beans, beet greens, corn, kale, legumes, peas, sweet potatoes, and yams. Potato skins. Cooked spinach and cabbage. Fruits Dried fruit, including raisins and dates. Raw fruits. Stewed or dried prunes. Canned fruits with syrup. Meat and other protein foods Fried or fatty meats. Deli meats. Chunky nut butters. Nuts and seeds. Beans and lentils. Berniece Salines. Hot dogs. Sausage. Dairy High-fat cheeses. Whole milk, chocolate milk, and beverages made with milk, such as milk shakes. Half-and-half. Cream. sour cream. Ice cream. Beverages Caffeinated beverages (such as coffee, tea, soda, or energy drinks). Alcoholic beverages. Fruit juices with pulp. Prune juice. Soft drinks sweetened with high-fructose corn syrup or sugar alcohols. High-calorie sports drinks. Fats and  oils Butter. Cream sauces. Margarine. Salad oils. Plain salad dressings. Olives. Avocados. Mayonnaise. Sweets and desserts Sweet rolls, doughnuts, and sweet breads. Sugar-free desserts sweetened with sugar alcohols such as xylitol and sorbitol. Seasoning and other foods Honey. Hot sauce. Chili powder. Gravy. Cream-based or milk-based soups. Pancakes and waffles. Summary  When you have diarrhea, the foods you eat and your eating habits are very important.  Make sure you get at least 8-10 cups of fluid each day, or enough to keep your urine clear or pale yellow.  Eat bland foods and gradually reintroduce healthy, nutrient-rich foods as tolerated, or as told by your health care provider.  Avoid high-fiber, fried, greasy, or spicy foods. This information is not intended to replace advice given to you by your health care provider. Make sure you discuss any questions you have with your health care provider. Document Released: 02/02/2004 Document Revised: 11/09/2016 Document Reviewed: 11/09/2016 Elsevier Interactive Patient Education  [employer].

## 2018-08-26 LAB — COMPREHENSIVE METABOLIC PANEL
ALT: 13 IU/L (ref 0–32)
AST: 19 IU/L (ref 0–40)
Albumin/Globulin Ratio: 1.6 (ref 1.2–2.2)
Albumin: 4.4 g/dL (ref 3.5–4.7)
Alkaline Phosphatase: 65 IU/L (ref 39–117)
BUN/Creatinine Ratio: 11 — ABNORMAL LOW (ref 12–28)
BUN: 15 mg/dL (ref 8–27)
Bilirubin Total: 0.6 mg/dL (ref 0.0–1.2)
CO2: 25 mmol/L (ref 20–29)
Calcium: 9.7 mg/dL (ref 8.7–10.3)
Chloride: 103 mmol/L (ref 96–106)
Creatinine, Ser: 1.37 mg/dL — ABNORMAL HIGH (ref 0.57–1.00)
GFR calc Af Amer: 42 mL/min/{1.73_m2} — ABNORMAL LOW (ref >59)
GFR calc non Af Amer: 36 mL/min/{1.73_m2} — ABNORMAL LOW (ref >59)
Globulin, Total: 2.7 g/dL (ref 1.5–4.5)
Glucose: 123 mg/dL — ABNORMAL HIGH (ref 65–99)
Potassium: 3.7 mmol/L (ref 3.5–5.2)
Sodium: 144 mmol/L (ref 134–144)
Total Protein: 7.1 g/dL (ref 6.0–8.5)

## 2018-08-26 LAB — HEMOGLOBIN A1C
Est. average glucose Bld gHb Est-mCnc: 166 mg/dL
Hgb A1c MFr Bld: 7.4 % — ABNORMAL HIGH (ref 4.8–5.6)

## 2018-08-26 LAB — TSH: TSH: 0.678 u[IU]/mL (ref 0.450–4.500)

## 2018-08-27 LAB — URINE CULTURE

## 2018-08-30 MED ORDER — CEPHALEXIN 500 MG PO CAPS: 500.0000 mg | ORAL_CAPSULE | Freq: Two times a day (BID) | ORAL | 0 refills | Status: DC

## 2018-08-30 NOTE — Addendum Note (Signed)
Addended by: Rutherford Guys on: 08/30/2018 06:30 PM   Modules accepted: Orders

## 2018-09-01 ENCOUNTER — Telehealth: Payer: Self-pay | Admitting: Family Medicine

## 2018-09-01 NOTE — Telephone Encounter (Signed)
Copied from [place] [id number]. Topic: General - Other >> Sep 01, 2018  2:59 PM Valla Leaver wrote: Reason for CRM: Patient calling back RE lab results, missed call from [place]. Still taking antibiotics.

## 2018-09-02 NOTE — Telephone Encounter (Signed)
Patient was advised to switch antibiotics

## 2018-09-12 ENCOUNTER — Other Ambulatory Visit: Payer: Self-pay | Admitting: Family Medicine

## 2018-09-12 NOTE — Telephone Encounter (Signed)
Requested medication (s) are due for refill today: No  Requested medication (s) are on the active medication list: No  Discontinued 02/20/18  Last refill:  02/18/18  Future visit scheduled: yes  Notes to clinic: Medication discontinued by Dr Myna Hidalgo at a hospital encounter. 02/20/18.     Requested Prescriptions  Pending Prescriptions Disp Refills   losartan (COZAAR) 25 MG tablet [Pharmacy Med Name: LOSARTAN POTASSIUM 25MG  TAB] 90 tablet 4    Sig: Take 1 tablet (25 mg total) by mouth daily.     Cardiovascular:  Angiotensin Receptor Blockers Failed - 09/12/2018  3:48 PM      Failed - Cr in normal range and within 180 days    Creat  Date Value Ref Range Status  10/02/2016 1.45 (H) 0.60 - 0.93 mg/dL Final    Comment:      For patients > or = 80 years of age: The upper reference limit for Creatinine is approximately 13% higher for people identified as African-American.      Creatinine, Ser  Date Value Ref Range Status  08/25/2018 1.37 (H) 0.57 - 1.00 mg/dL Final         Passed - K in normal range and within 180 days    Potassium  Date Value Ref Range Status  08/25/2018 3.7 3.5 - 5.2 mmol/L Final         Passed - Patient is not pregnant      Passed - Last BP in normal range    BP Readings from Last 1 Encounters:  08/25/18 136/84         Passed - Valid encounter within last 6 months    Recent Outpatient Visits          2 weeks ago Lower abdominal pain   [facility], Lilia Argue, MD   2 months ago Lower abdominal pain   [facility], Lilia Argue, MD   3 months ago Hypercalcemia   [facility], Lilia Argue, MD   4 months ago Acute pain of right shoulder   [facility], Lilia Argue, MD   9 months ago Essential hypertension   Primary Care at [facility], Renette Butters, MD      Future Appointments            In 2 months Shawnee Knapp, MD Primary Care at [place], [facility]

## 2018-09-12 NOTE — Telephone Encounter (Signed)
Call place to pharmacy. Spoke with pharmacist. Refill request for clonazepam sent in error.

## 2018-09-16 ENCOUNTER — Other Ambulatory Visit: Payer: Self-pay | Admitting: Family Medicine

## 2018-09-16 NOTE — Telephone Encounter (Signed)
Copied from [place] [phone]. Topic: Quick Communication - Rx Refill/Question >> Sep 16, 2018  1:18 PM Burns, Sherri Reap wrote: Medication: polyethylene glycol (MIRALAX / GLYCOLAX) packet  Has the patient contacted their pharmacy? no  Preferred Pharmacy (with phone number or street name): [place], [address] [phone] (Phone) [phone] (Fax)  Agent: Please be advised that RX refills may take up to 3 business days. We ask that you follow-up with your pharmacy.

## 2018-09-16 NOTE — Telephone Encounter (Signed)
Requested medication (s) are due for refill today: ?  Requested medication (s) are on the active medication list: yes  Last refill:  ?  Future visit scheduled: yes  Notes to clinic:  Prescription written by historical provider    Requested Prescriptions  Pending Prescriptions Disp Refills   polyethylene glycol (MIRALAX / GLYCOLAX) packet 14 each     Sig: Take 8.5 g by mouth every morning.     Gastroenterology:  Laxatives Passed - 09/16/2018  1:24 PM      Passed - Valid encounter within last 12 months    Recent Outpatient Visits          3 weeks ago Lower abdominal pain   [facility], Lilia Argue, MD   2 months ago Lower abdominal pain   [facility], Lilia Argue, MD   3 months ago Hypercalcemia   [facility], Lilia Argue, MD   4 months ago Acute pain of right shoulder   [facility], Lilia Argue, MD   9 months ago Essential hypertension   Primary Care at [facility], Renette Butters, MD      Future Appointments            In 2 months Shawnee Knapp, MD Primary Care at [place], [facility]

## 2018-09-18 MED ORDER — POLYETHYLENE GLYCOL 3350 17 G PO PACK: 8.5000 g | PACK | ORAL | 2 refills | Status: DC

## 2018-09-30 ENCOUNTER — Ambulatory Visit: Payer: Medicare Other | Admitting: Family Medicine

## 2018-10-02 ENCOUNTER — Other Ambulatory Visit: Payer: Self-pay | Admitting: Family Medicine

## 2018-11-06 DIAGNOSIS — E119 Type 2 diabetes mellitus without complications: Secondary | ICD-10-CM | POA: Diagnosis not present

## 2018-11-12 ENCOUNTER — Other Ambulatory Visit: Payer: Self-pay | Admitting: Family Medicine

## 2018-11-12 NOTE — Telephone Encounter (Signed)
Requested medication (s) are due for refill today: Yes  Requested medication (s) are on the active medication list: Yes  Last refill:  08/25/18  Future visit scheduled: Yes  Notes to clinic:  See request    Requested Prescriptions  Pending Prescriptions Disp Refills   clonazePAM (KLONOPIN) 1 MG tablet [Pharmacy Med Name: CLONAZEPAM 1MG  TAB] 45 tablet 2    Sig: TAKE 1/2 TABLET BY MOUTH EVERY MORNING AND TAKE ONE TABLET BY MOUTH AT BEDTIME     Not Delegated - Psychiatry:  Anxiolytics/Hypnotics Failed - 11/12/2018 12:09 PM      Failed - This refill cannot be delegated      Failed - Urine Drug Screen completed in last 360 days.      Passed - Valid encounter within last 6 months    Recent Outpatient Visits          2 months ago Lower abdominal pain   [facility], Lilia Argue, MD   4 months ago Lower abdominal pain   [facility], Lilia Argue, MD   5 months ago Hypercalcemia   [facility], Lilia Argue, MD   6 months ago Acute pain of right shoulder   [facility], Lilia Argue, MD   11 months ago Essential hypertension   Primary Care at [facility], Renette Butters, MD      Future Appointments            In 2 weeks Shawnee Knapp, MD Primary Care at [place], [facility]         Signed Prescriptions Disp Refills   glipiZIDE (GLUCOTROL) 10 MG tablet 360 tablet 0    Sig: TAKE TWO TABLETS BY MOUTH TWICE A DAY 30 MINUTES BEFORE A MEAL     Endocrinology:  Diabetes - Sulfonylureas Passed - 11/12/2018 12:09 PM      Passed - HBA1C is between 0 and 7.9 and within 180 days    Hgb A1c MFr Bld  Date Value Ref Range Status  08/25/2018 7.4 (H) 4.8 - 5.6 % Final    Comment:             Prediabetes: 5.7 - 6.4          Diabetes: >6.4          Glycemic control for adults with diabetes: <7.0          Passed - Valid encounter within last 6 months    Recent Outpatient Visits          2 months ago Lower abdominal pain   [facility], Lilia Argue, MD   4 months ago Lower abdominal pain   [facility], Lilia Argue, MD   5 months ago Hypercalcemia   [facility] at [facility], Lilia Argue, MD   6 months ago Acute pain of right shoulder   [facility], Lilia Argue, MD   11 months ago Essential hypertension   Primary Care at [facility], Renette Butters, MD      Future Appointments            In 2 weeks Shawnee Knapp, MD Primary Care at [place], [facility]

## 2018-11-13 NOTE — Telephone Encounter (Signed)
Med refill approval or deny needed 

## 2018-11-16 NOTE — Telephone Encounter (Signed)
Please let patient know that I declined her clonazepam refill I see that she just refilled 45 tabs on Dec 2nd 2019 She also still has one refill left from the original prescription thanks

## 2018-11-28 ENCOUNTER — Other Ambulatory Visit: Payer: Self-pay | Admitting: Family Medicine

## 2018-11-28 NOTE — Telephone Encounter (Signed)
Requested medication (s) are due for refill today: yes  Requested medication (s) are on the active medication list: yes  Last refill:  08/25/18 #45 with 3 refills  Future visit scheduled: yes  Notes to clinic:  Unable to refill per protocol    Requested Prescriptions  Pending Prescriptions Disp Refills   clonazePAM (KLONOPIN) 1 MG tablet [Pharmacy Med Name: CLONAZEPAM 1MG  TAB] 45 tablet 2    Sig: TAKE 1/2 TABLET BY MOUTH EVERY MORNING AND TAKE ONE TABLET BY MOUTH AT BEDTIME     Not Delegated - Psychiatry:  Anxiolytics/Hypnotics Failed - 11/28/2018  4:52 PM      Failed - This refill cannot be delegated      Failed - Urine Drug Screen completed in last 360 days.      Passed - Valid encounter within last 6 months    Recent Outpatient Visits          3 months ago Lower abdominal pain   [facility] at [facility], Lilia Argue, MD   5 months ago Lower abdominal pain   [facility] at [facility], Lilia Argue, MD   6 months ago Hypercalcemia   [facility] at [facility], Lilia Argue, MD   6 months ago Acute pain of right shoulder   [facility] at [facility], Lilia Argue, MD   11 months ago Essential hypertension   Primary Care at [facility], Renette Butters, MD      Future Appointments            In 3 days Shawnee Knapp, MD Primary Care at [place], [facility]         Refused Prescriptions Disp Refills   glipiZIDE (GLUCOTROL) 10 MG tablet [Pharmacy Med Name: GLIPIZIDE 10MG  TAB] 120 tablet 4    Sig: TAKE TWO TABLETS BY MOUTH TWICE A DAY 30 MINUTES BEFORE A MEAL     Endocrinology:  Diabetes - Sulfonylureas Passed - 11/28/2018  4:52 PM      Passed - HBA1C is between 0 and 7.9 and within 180 days    Hgb A1c MFr Bld  Date Value Ref Range Status  08/25/2018 7.4 (H) 4.8 - 5.6 % Final    Comment:             Prediabetes: 5.7 - 6.4          Diabetes: >6.4          Glycemic control for adults with diabetes: <7.0          Passed - Valid encounter within last 6 months    Recent Outpatient  Visits          3 months ago Lower abdominal pain   [facility] at [facility], Lilia Argue, MD   5 months ago Lower abdominal pain   [facility] at [facility], Lilia Argue, MD   6 months ago Hypercalcemia   [facility] at [facility], Lilia Argue, MD   6 months ago Acute pain of right shoulder   [facility] at [facility], Lilia Argue, MD   11 months ago Essential hypertension   Primary Care at [facility], Renette Butters, MD      Future Appointments            In 3 days Shawnee Knapp, MD Primary Care at [place], [facility]

## 2018-12-01 ENCOUNTER — Ambulatory Visit: Payer: Medicare Other | Admitting: Family Medicine

## 2018-12-01 NOTE — Telephone Encounter (Signed)
pmp reviewed Last filled 11/24/18 Has cancelled appt in nov and dec Med denied Needs to be seen, please schedule

## 2018-12-24 DIAGNOSIS — N2581 Secondary hyperparathyroidism of renal origin: Secondary | ICD-10-CM | POA: Diagnosis not present

## 2018-12-24 DIAGNOSIS — D631 Anemia in chronic kidney disease: Secondary | ICD-10-CM | POA: Diagnosis not present

## 2018-12-24 DIAGNOSIS — I129 Hypertensive chronic kidney disease with stage 1 through stage 4 chronic kidney disease, or unspecified chronic kidney disease: Secondary | ICD-10-CM | POA: Diagnosis not present

## 2018-12-24 DIAGNOSIS — N189 Chronic kidney disease, unspecified: Secondary | ICD-10-CM | POA: Diagnosis not present

## 2018-12-24 DIAGNOSIS — E1122 Type 2 diabetes mellitus with diabetic chronic kidney disease: Secondary | ICD-10-CM | POA: Diagnosis not present

## 2018-12-24 DIAGNOSIS — N183 Chronic kidney disease, stage 3 (moderate): Secondary | ICD-10-CM | POA: Diagnosis not present

## 2018-12-24 DIAGNOSIS — R809 Proteinuria, unspecified: Secondary | ICD-10-CM | POA: Diagnosis not present

## 2019-01-01 ENCOUNTER — Other Ambulatory Visit: Payer: Self-pay | Admitting: Nephrology

## 2019-01-01 DIAGNOSIS — N183 Chronic kidney disease, stage 3 unspecified: Secondary | ICD-10-CM

## 2019-01-01 DIAGNOSIS — I129 Hypertensive chronic kidney disease with stage 1 through stage 4 chronic kidney disease, or unspecified chronic kidney disease: Secondary | ICD-10-CM

## 2019-01-05 ENCOUNTER — Other Ambulatory Visit: Payer: Self-pay | Admitting: Family Medicine

## 2019-01-05 NOTE — Telephone Encounter (Signed)
Requested medication (s) are due for refill today: yes  Requested medication (s) are on the active medication list: yes  Last refill:  08/25/18  Future visit scheduled: yes  Notes to clinic:  Medication not delegated to NT to refill   Requested Prescriptions  Pending Prescriptions Disp Refills   clonazePAM (KLONOPIN) 1 MG tablet [Pharmacy Med Name: CLONAZEPAM 1MG  TAB] 45 tablet 2    Sig: TAKE 1/2 TABLET BY MOUTH EVERY MORNING AND TAKE ONE TABLET BY MOUTH AT BEDTIME     Not Delegated - Psychiatry:  Anxiolytics/Hypnotics Failed - 01/05/2019 10:16 AM      Failed - This refill cannot be delegated      Failed - Urine Drug Screen completed in last 360 days.      Passed - Valid encounter within last 6 months    Recent Outpatient Visits          4 months ago Lower abdominal pain   [facility] at [facility], Lilia Argue, MD   6 months ago Lower abdominal pain   [facility] at [facility], Lilia Argue, MD   7 months ago Hypercalcemia   [facility] at [facility], Lilia Argue, MD   7 months ago Acute pain of right shoulder   [facility] at [facility], Lilia Argue, MD   1 year ago Essential hypertension   Primary Care at [facility], Renette Butters, MD      Future Appointments            In 3 weeks Rutherford Guys, MD Primary Care at [place], [facility]

## 2019-01-07 NOTE — Telephone Encounter (Signed)
pmp reviewed Has upcoming appt with me in march Med refilled

## 2019-01-08 ENCOUNTER — Ambulatory Visit: Payer: Medicare Other | Admitting: Family Medicine

## 2019-01-10 ENCOUNTER — Emergency Department (HOSPITAL_COMMUNITY): Payer: Medicare Other

## 2019-01-10 ENCOUNTER — Other Ambulatory Visit: Payer: Self-pay

## 2019-01-10 ENCOUNTER — Emergency Department (HOSPITAL_COMMUNITY)
Admission: EM | Admit: 2019-01-10 | Discharge: 2019-01-10 | Disposition: A | Discharge status: Home / Self Care | Payer: Medicare Other | Attending: Emergency Medicine | Admitting: Emergency Medicine

## 2019-01-10 ENCOUNTER — Encounter (HOSPITAL_COMMUNITY): Payer: Self-pay | Admitting: Emergency Medicine

## 2019-01-10 DIAGNOSIS — I130 Hypertensive heart and chronic kidney disease with heart failure and stage 1 through stage 4 chronic kidney disease, or unspecified chronic kidney disease: Secondary | ICD-10-CM | POA: Insufficient documentation

## 2019-01-10 DIAGNOSIS — Z79899 Other long term (current) drug therapy: Secondary | ICD-10-CM | POA: Diagnosis not present

## 2019-01-10 DIAGNOSIS — Z7984 Long term (current) use of oral hypoglycemic drugs: Secondary | ICD-10-CM | POA: Diagnosis not present

## 2019-01-10 DIAGNOSIS — E1122 Type 2 diabetes mellitus with diabetic chronic kidney disease: Secondary | ICD-10-CM | POA: Insufficient documentation

## 2019-01-10 DIAGNOSIS — E039 Hypothyroidism, unspecified: Secondary | ICD-10-CM | POA: Diagnosis not present

## 2019-01-10 DIAGNOSIS — M7918 Myalgia, other site: Secondary | ICD-10-CM | POA: Insufficient documentation

## 2019-01-10 DIAGNOSIS — F17210 Nicotine dependence, cigarettes, uncomplicated: Secondary | ICD-10-CM | POA: Insufficient documentation

## 2019-01-10 DIAGNOSIS — R51 Headache: Secondary | ICD-10-CM | POA: Insufficient documentation

## 2019-01-10 DIAGNOSIS — M791 Myalgia, unspecified site: Secondary | ICD-10-CM

## 2019-01-10 DIAGNOSIS — F419 Anxiety disorder, unspecified: Secondary | ICD-10-CM | POA: Insufficient documentation

## 2019-01-10 DIAGNOSIS — I5032 Chronic diastolic (congestive) heart failure: Secondary | ICD-10-CM | POA: Insufficient documentation

## 2019-01-10 DIAGNOSIS — Z85528 Personal history of other malignant neoplasm of kidney: Secondary | ICD-10-CM | POA: Insufficient documentation

## 2019-01-10 DIAGNOSIS — Z9049 Acquired absence of other specified parts of digestive tract: Secondary | ICD-10-CM | POA: Insufficient documentation

## 2019-01-10 DIAGNOSIS — N183 Chronic kidney disease, stage 3 (moderate): Secondary | ICD-10-CM | POA: Insufficient documentation

## 2019-01-10 DIAGNOSIS — R0782 Intercostal pain: Secondary | ICD-10-CM | POA: Diagnosis not present

## 2019-01-10 DIAGNOSIS — I10 Essential (primary) hypertension: Secondary | ICD-10-CM | POA: Diagnosis not present

## 2019-01-10 LAB — CBC WITH DIFFERENTIAL/PLATELET
Abs Immature Granulocytes: 0.02 10*3/uL (ref 0.00–0.07)
Basophils Absolute: 0.1 10*3/uL (ref 0.0–0.1)
Basophils Relative: 1 %
Eosinophils Absolute: 0.2 10*3/uL (ref 0.0–0.5)
Eosinophils Relative: 2 %
HCT: 41.9 % (ref 36.0–46.0)
HEMOGLOBIN: 13.3 g/dL (ref 12.0–15.0)
Immature Granulocytes: 0 %
LYMPHS PCT: 42 %
Lymphs Abs: 3.1 10*3/uL (ref 0.7–4.0)
MCH: 30.7 pg (ref 26.0–34.0)
MCHC: 31.7 g/dL (ref 30.0–36.0)
MCV: 96.8 fL (ref 80.0–100.0)
MONOS PCT: 7 %
Monocytes Absolute: 0.5 10*3/uL (ref 0.1–1.0)
Neutro Abs: 3.5 10*3/uL (ref 1.7–7.7)
Neutrophils Relative %: 48 %
Platelets: UNDETERMINED 10*3/uL (ref 150–400)
RBC: 4.33 MIL/uL (ref 3.87–5.11)
RDW: 13.8 % (ref 11.5–15.5)
WBC: 7.4 10*3/uL (ref 4.0–10.5)
nRBC: 0 % (ref 0.0–0.2)

## 2019-01-10 LAB — BASIC METABOLIC PANEL
Anion gap: 8 (ref 5–15)
BUN: 11 mg/dL (ref 8–23)
CHLORIDE: 105 mmol/L (ref 98–111)
CO2: 28 mmol/L (ref 22–32)
Calcium: 9.9 mg/dL (ref 8.9–10.3)
Creatinine, Ser: 1.21 mg/dL — ABNORMAL HIGH (ref 0.44–1.00)
GFR calc Af Amer: 49 mL/min — ABNORMAL LOW (ref >60)
GFR calc non Af Amer: 42 mL/min — ABNORMAL LOW (ref >60)
Glucose, Bld: 91 mg/dL (ref 70–99)
Potassium: 4 mmol/L (ref 3.5–5.1)
Sodium: 141 mmol/L (ref 135–145)

## 2019-01-10 LAB — I-STAT TROPONIN, ED: Troponin i, poc: 0 ng/mL (ref 0.00–0.08)

## 2019-01-10 MED ORDER — ACETAMINOPHEN 325 MG PO TABS
650.0000 mg | ORAL_TABLET | Freq: Once | ORAL | Status: AC
  Administered 2019-01-10: 650 mg via ORAL
  Filled 2019-01-10: qty 2

## 2019-01-10 NOTE — ED Notes (Signed)
MD at bedside. 

## 2019-01-10 NOTE — ED Provider Notes (Signed)
[facility] Provider Note   CSN: [id number] Arrival date & time: 01/10/19  1231     History   Chief Complaint Chief Complaint  Patient presents with  . Muscle Pain    HPI LEONETTE TISCHER is a 83 y.o. female.  The history is provided by the patient.  Muscle Pain  This is a new problem. The current episode started more than 1 week ago. The problem occurs daily. Progression since onset: waxing and waning. Associated symptoms include headaches. Pertinent negatives include no chest pain, no abdominal pain and no shortness of breath. Associated symptoms comments: Pain over left side of ribs that has been intermittent for the past few weeks. Pain all over per patient including headaches and body aches. . Nothing aggravates the symptoms. Nothing relieves the symptoms. She has tried nothing for the symptoms. The treatment provided no relief.    Past Medical History:  Diagnosis Date  . Abnormal EKG 02/07/2016   Inferolateral T wave inversion and ST depression.  . Acute calculous cholecystitis   . Anxiety   . Arthritis   . Chronic diastolic CHF (congestive heart failure) ([place]) 02/21/2018  . Colon polyps 11/2008   tubular adenoma  . Diabetes mellitus   . Esophageal problem    esophageal dilation  . GERD (gastroesophageal reflux disease)    + hpylori  . GI bleeding 01/2018  . Headache   . Hyperlipidemia   . Hypertension   . Hypothyroidism   . Ischemic colitis ([place])   . Renal cancer, right ([place]) 2010   s/p Rt nephrectomy by Dr. Rosana Hoes  . Renal insufficiency     Patient Active Problem List   Diagnosis Date Noted  . S/p nephrectomy 06/24/2018  . Chronic diastolic CHF (congestive heart failure) ([place]) 02/21/2018  . Acute lower GI bleeding 02/20/2018  . Hyperlipidemia   . Abnormal EKG 02/07/2016  . Type 2 diabetes mellitus with diabetic polyneuropathy, without long-term current use of insulin ([place]) 09/23/2012  . Essential hypertension   .  Hypothyroidism   . CKD (chronic kidney disease), stage III [facility])     Past Surgical History:  Procedure Laterality Date  . ABDOMINAL HYSTERECTOMY  1978   partial  . CHOLECYSTECTOMY N/A 04/04/2016   Procedure: LAPAROSCOPIC CHOLECYSTECTOMY;  Surgeon: Autumn Messing III, MD;  Location: [facility];  Service: General;  Laterality: N/A;  . COLONOSCOPY    . ESOPHAGEAL DILATION  2016  . HERNIA REPAIR    . LAPAROSCOPIC CHOLECYSTECTOMY  04/04/2016  . NEPHRECTOMY Right 2010   Dr. Rosana Hoes, urology, due to renal cancer     OB History   No obstetric history on file.      Home Medications    Prior to Admission medications   Medication Sig Start Date End Date Taking? Authorizing Provider  acetaminophen (TYLENOL) 500 MG tablet Take 500 mg by mouth every 6 (six) hours as needed for mild pain.    [provider]  cephALEXin (KEFLEX) 500 MG capsule Take 1 capsule (500 mg total) by mouth 2 (two) times daily. 08/30/18   Rutherford Guys, MD  cholecalciferol (VITAMIN D) 1000 units tablet Take 1,000 Units by mouth daily.    [provider]  ciprofloxacin (CIPRO) 500 MG tablet Take 1 tablet (500 mg total) by mouth 2 (two) times daily. 08/25/18   Rutherford Guys, MD  clonazePAM (KLONOPIN) 1 MG tablet TAKE 1/2 TABLET BY MOUTH EVERY MORNING AND TAKE ONE TABLET BY MOUTH AT BEDTIME 01/07/19  Rutherford Guys, MD  Colloidal Oatmeal (ECZEMA MOISTURIZING) 1 % LOTN Apply 1 application topically as needed. Eczema cream    [provider]  diclofenac sodium (VOLTAREN) 1 % GEL APPLY TWO GRAMS TOPICALLY 4 (FOUR) TIMES DAILY. 08/25/18   Rutherford Guys, MD  diltiazem (CARDIZEM CD) 360 MG 24 hr capsule Take 1 capsule (360 mg total) by mouth daily. 08/25/18   Rutherford Guys, MD  fluticasone (FLONASE) 50 MCG/ACT nasal spray PLACE 1 SPRAY INTO BOTH NOSTRILS DAILY Patient taking differently: Place 1 spray into both nostrils at bedtime.  01/10/18   Shawnee Knapp, MD  furosemide (LASIX) 40 MG tablet Take 1 tablet  (40 mg total) by mouth daily. 08/25/18   Rutherford Guys, MD  glipiZIDE (GLUCOTROL) 10 MG tablet TAKE TWO TABLETS BY MOUTH TWICE A DAY 30 MINUTES BEFORE A MEAL 11/12/18   Rutherford Guys, MD  levothyroxine (SYNTHROID, LEVOTHROID) 88 MCG tablet Take 1 tablet (88 mcg total) by mouth daily before breakfast. 08/25/18   Rutherford Guys, MD  metoprolol tartrate (LOPRESSOR) 100 MG tablet Take 1 tablet (100 mg total) by mouth 2 (two) times daily. 08/25/18   Rutherford Guys, MD  neomycin-polymyxin-dexameth (MAXITROL) 0.1 % OINT 1 application as needed. In her hear when needed for itching    [provider]  omeprazole (PRILOSEC) 40 MG capsule Take 1 capsule (40 mg total) by mouth daily. 08/25/18   Rutherford Guys, MD  polyethylene glycol [facility] / Floria Raveling) packet Take 8.5 g by mouth every morning. 09/18/18   Rutherford Guys, MD  vitamin E 100 UNIT capsule Take 100 Units by mouth daily.    [provider]    Family History Family History  Problem Relation Age of Onset  . Cancer Mother        colon, kidney cancer  . Cancer Father        throat cancer  . Cancer Sister   . Heart attack Brother     Social History Social History   Tobacco Use  . Smoking status: Current Some Day Smoker    Packs/day: 0.25    Years: 55.00    Pack years: 13.75    Types: Cigarettes  . Smokeless tobacco: Never Used  . Tobacco comment: cut back amount still  trying to quit  Substance Use Topics  . Alcohol use: No    Alcohol/week: 0.0 standard drinks  . Drug use: No     Allergies   Codeine; Metformin and related; Ciprocin-fluocin-procin [fluocinolone acetonide]; Clonidine derivatives; Crestor [rosuvastatin calcium]; Penicillins; Simvastatin; and Tessalon perles   Review of Systems Review of Systems  Constitutional: Negative for chills and fever.  HENT: Negative for ear pain and sore throat.   Eyes: Negative for pain and visual disturbance.  Respiratory: Negative for cough, shortness of  breath, wheezing and stridor.   Cardiovascular: Negative for chest pain and palpitations.  Gastrointestinal: Negative for abdominal pain and vomiting.  Genitourinary: Negative for dysuria and hematuria.  Musculoskeletal: Positive for arthralgias and myalgias. Negative for back pain, gait problem, joint swelling and neck pain.  Skin: Negative for color change and rash.  Neurological: Positive for headaches. Negative for seizures and syncope.  All other systems reviewed and are negative.    Physical Exam Updated Vital Signs  ED Triage Vitals  Enc Vitals Group     BP 01/10/19 1300 (!) 188/96     Pulse Rate 01/10/19 1300 78     Resp 01/10/19 1300 12  Temp 01/10/19 1300 98 F (36.7 C)     Temp Source 01/10/19 1300 Oral     SpO2 01/10/19 1300 100 %     Weight 01/10/19 1249 170 lb (77.1 kg)     Height 01/10/19 1249 5\' 4"  (1.626 m)     Head Circumference --      Peak Flow --      Pain Score 01/10/19 1249 10     Pain Loc --      Pain Edu? --      Excl. in [place]? --     Physical Exam Vitals signs and nursing note reviewed.  Constitutional:      General: She is not in acute distress.    Appearance: She is well-developed.  HENT:     Head: Normocephalic and atraumatic.     Nose: Nose normal.     Mouth/Throat:     Mouth: Mucous membranes are moist.  Eyes:     Extraocular Movements: Extraocular movements intact.     Conjunctiva/sclera: Conjunctivae normal.     Pupils: Pupils are equal, round, and reactive to light.  Neck:     Musculoskeletal: Normal range of motion and neck supple.  Cardiovascular:     Rate and Rhythm: Normal rate and regular rhythm.     Pulses: Normal pulses.     Heart sounds: Normal heart sounds. No murmur.  Pulmonary:     Effort: Pulmonary effort is normal. No respiratory distress.     Breath sounds: Normal breath sounds.  Abdominal:     General: There is no distension.     Palpations: Abdomen is soft.     Tenderness: There is no abdominal tenderness.   Musculoskeletal: Normal range of motion.  Skin:    General: Skin is warm and dry.     Capillary Refill: Capillary refill takes less than 2 seconds.  Neurological:     General: No focal deficit present.     Mental Status: She is alert and oriented to person, place, and time.     Cranial Nerves: No cranial nerve deficit.     Sensory: No sensory deficit.     Motor: No weakness.     Coordination: Coordination normal.     Gait: Gait normal.     Comments: 5+/5 strength, normal sensation, normal finger to nose finger, no drift, normal gait  Psychiatric:        Mood and Affect: Mood normal.      ED Treatments / Results  Labs (all labs ordered are listed, but only abnormal results are displayed) Labs Reviewed  BASIC METABOLIC PANEL - Abnormal; Notable for the following components:      Result Value   Creatinine, Ser 1.21 (*)    GFR calc non Af Amer 42 (*)    GFR calc Af Amer 49 (*)    All other components within normal limits  CBC WITH DIFFERENTIAL/PLATELET  I-STAT TROPONIN, ED    EKG EKG Interpretation  Date/Time:  Saturday January 10 2019 15:54:15 EST Ventricular Rate:  59 PR Interval:    QRS Duration: 108 QT Interval:  426 QTC Calculation: 422 R Axis:   -5 Text Interpretation:  Sinus rhythm Borderline prolonged PR interval Confirmed by Lennice Sites [phone]) on 01/10/2019 3:56:38 PM   Radiology Dg Chest 2 View  Result Date: 01/10/2019 CLINICAL DATA:  Ration pain in torso, history shingles EXAM: CHEST - 2 VIEW COMPARISON:  01/02/2017 FINDINGS: Normal heart size, mediastinal contours, and pulmonary vascularity. Mild tortuosity of thoracic  aorta. Lungs clear. No pulmonary infiltrate, pleural effusion, or pneumothorax. Bones demineralized. IMPRESSION: No acute abnormalities. Electronically Signed   By: Lavonia Dana M.D.   On: 01/10/2019 16:37   Ct Head Wo Contrast  Result Date: 01/10/2019 CLINICAL DATA:  Question shingles, pain shooting through torso in face on LEFT, rash,  focal neural deficit of more than 6 hours question stroke, history diabetes mellitus, hypertension, CHF, smoker EXAM: CT HEAD WITHOUT CONTRAST TECHNIQUE: Contiguous axial images were obtained from the base of the skull through the vertex without intravenous contrast. Sagittal and coronal MPR images reconstructed from axial data set. COMPARISON:  None FINDINGS: Brain: Normal ventricular morphology. No midline shift or mass effect. Normal appearance of brain parenchyma. No intracranial hemorrhage, mass lesion, evidence of acute infarction, or extra-axial fluid collection. Vascular: Minimal atherosclerotic calcifications of internal carotid arteries at skull base. Skull: Intact Sinuses/Orbits: Clear Other: N/A IMPRESSION: No acute intracranial abnormalities. Electronically Signed   By: Lavonia Dana M.D.   On: 01/10/2019 16:37    Procedures Procedures (including critical care time)  Medications Ordered in ED Medications  acetaminophen (TYLENOL) tablet 650 mg (650 mg Oral Given 01/10/19 1533)     Initial Impression / Assessment and Plan / ED Course  I have reviewed the triage vital signs and the nursing notes.  Pertinent labs & imaging results that were available during my care of the patient were reviewed by me and considered in my medical decision making (see chart for details).     VERNESHA TALBOT is an 83 year old female with history of hypertension, high cholesterol, anxiety who presents to the ED with multiple complaints.  Patient with normal vitals.  No fever.  Patient mostly concerned about possible herpes zoster virus as she has had some left-sided chest wall pain.  But patient does not have any rash.  There history and physical is not consistent with zoster's infection.  Patient states she has had some headaches, pain throughout her body.  She has had difficulty with sleep and eating.  She states that she has been very anxious.  Overall patient is neurologically intact.  Does not have any  obvious infectious symptoms.  Head CT was negative.  Chest x-ray showed no signs of pneumonia, pneumothorax, pleural effusion.  Patient had EKG that showed sinus rhythm.  No new ischemic changes.  Troponin within normal limits.  Doubt ACS.  No PE or DVT risk factors.  No significant anemia, electrolyte abnormality, kidney injury.  Patient possibly with a mild viral process.  Believe that there may be some component of anxiety as well.  Recommend close follow-up with primary care doctor and given return precautions.  No emergent findings today.  This chart was dictated using voice recognition software.  Despite best efforts to proofread,  errors can occur which can change the documentation meaning.    Final Clinical Impressions(s) / ED Diagnoses   Final diagnoses:  Muscle ache    ED Discharge Orders    None       Lennice Sites, DO 01/10/19 1727

## 2019-01-10 NOTE — ED Notes (Signed)
Provided patient with ginger ale and [place] sandwich. Reviewed discharge instructions with patient, who verbalized understanding and denies further needs or questions. VSS, patient ambulatory with steady gait. Assisted to ED lobby in wheelchair.

## 2019-01-10 NOTE — ED Triage Notes (Signed)
Pt states she has a rash on left torso and face. No rash noted. Pt states she thinks she has shingles again. States pain is "shooting through" and feels like shingles 9 years ago.

## 2019-01-13 DIAGNOSIS — I129 Hypertensive chronic kidney disease with stage 1 through stage 4 chronic kidney disease, or unspecified chronic kidney disease: Secondary | ICD-10-CM | POA: Diagnosis not present

## 2019-01-30 ENCOUNTER — Ambulatory Visit (INDEPENDENT_AMBULATORY_CARE_PROVIDER_SITE_OTHER): Payer: Medicare Other | Admitting: Family Medicine

## 2019-01-30 ENCOUNTER — Encounter: Payer: Self-pay | Admitting: Family Medicine

## 2019-01-30 VITALS — BP 135/78 | HR 70 | Temp 97.5°F | Ht 64.0 in | Wt 171.0 lb

## 2019-01-30 DIAGNOSIS — Z23 Encounter for immunization: Secondary | ICD-10-CM | POA: Diagnosis not present

## 2019-01-30 DIAGNOSIS — E782 Mixed hyperlipidemia: Secondary | ICD-10-CM | POA: Diagnosis not present

## 2019-01-30 DIAGNOSIS — E1142 Type 2 diabetes mellitus with diabetic polyneuropathy: Secondary | ICD-10-CM

## 2019-01-30 DIAGNOSIS — Z79899 Other long term (current) drug therapy: Secondary | ICD-10-CM | POA: Diagnosis not present

## 2019-01-30 DIAGNOSIS — I10 Essential (primary) hypertension: Secondary | ICD-10-CM | POA: Diagnosis not present

## 2019-01-30 DIAGNOSIS — N183 Chronic kidney disease, stage 3 unspecified: Secondary | ICD-10-CM

## 2019-01-30 DIAGNOSIS — Z9109 Other allergy status, other than to drugs and biological substances: Secondary | ICD-10-CM

## 2019-01-30 DIAGNOSIS — Z5181 Encounter for therapeutic drug level monitoring: Secondary | ICD-10-CM

## 2019-01-30 DIAGNOSIS — Z13820 Encounter for screening for osteoporosis: Secondary | ICD-10-CM | POA: Diagnosis not present

## 2019-01-30 DIAGNOSIS — E039 Hypothyroidism, unspecified: Secondary | ICD-10-CM

## 2019-01-30 LAB — POCT GLYCOSYLATED HEMOGLOBIN (HGB A1C): Hemoglobin A1C: 7.4 % — AB (ref 4.0–5.6)

## 2019-01-30 MED ORDER — CLONAZEPAM 1 MG PO TABS: ORAL_TABLET | ORAL | 5 refills | Status: DC

## 2019-01-30 MED ORDER — AMLODIPINE BESYLATE 10 MG PO TABS: 10.0000 mg | ORAL_TABLET | Freq: Every day | ORAL | 3 refills | Status: DC

## 2019-01-30 MED ORDER — FLUTICASONE PROPIONATE 50 MCG/ACT NA SUSP: 1.0000 | Freq: Every day | NASAL | 5 refills | Status: DC

## 2019-01-30 MED ORDER — POLYETHYLENE GLYCOL 3350 17 G PO PACK: 17.0000 g | PACK | ORAL | 1 refills | Status: DC

## 2019-01-30 NOTE — Progress Notes (Signed)
3/6/20202:47 PM  Sherri Burns [birth year], 83 y.o. female [id number]  Chief Complaint  Patient presents with  . Diabetes  . Hypertension  . Medication Refill    clonazapam    HPI:   Patient is a 83 y.o. female with past medical history significant for DM2, CKD3, s/p nephrectomy, HTN, anxiety on long term bzd, hypothyroidism who presents today for routine followup  Last OV sept 2019 She is overall doing well  Recently saw renal - patient reports BP meds was changed, stopped cardiazem, ordered imagining to eval for renal artery stenosis, has been feeling much better since changes in medication Does not checks cbgs Denies any lows Long standing anxiety managed only on clonazepam pmp reviewed Has been seeing chiropractor for low back pain - told her coccyx was "bent" - it was a long time, she has not tried not using diclofenac gel   Lab Results  Component Value Date   HGBA1C 7.4 (A) 01/30/2019   HGBA1C 7.4 (H) 08/25/2018   HGBA1C 7.3 (H) 05/27/2018   Lab Results  Component Value Date   MICROALBUR 0.7 10/02/2016   LDLCALC 131 (H) 02/23/2017   CREATININE 1.21 (H) 01/10/2019    Fall Risk  01/30/2019 08/25/2018 05/27/2018 05/13/2018 12/07/2017  Falls in the past year? 0 No No No No  Number falls in past yr: 0 - - - -  Injury with Fall? 0 - - - -     Depression screen [place] 2/9 01/30/2019 08/25/2018 05/27/2018  Decreased Interest 0 0 0  Down, Depressed, Hopeless 0 0 0  PHQ - 2 Score 0 0 0  Altered sleeping - - -  Tired, decreased energy - - -  Change in appetite - - -  Feeling bad or failure about yourself  - - -  Trouble concentrating - - -  Moving slowly or fidgety/restless - - -  Suicidal thoughts - - -  PHQ-9 Score - - -  Difficult doing work/chores - - -  Some recent data might be hidden    Allergies  Allergen Reactions  . Codeine Other (See Comments)    hallucinations  . Metformin And Related     Upset stomach  . Ciprocin-Fluocin-Procin [Fluocinolone  Acetonide] Other (See Comments)    Unknown reaction  . Clonidine Derivatives Other (See Comments)    Dry mouth  . Crestor [Rosuvastatin Calcium] Other (See Comments)    cramps  . Penicillins Other (See Comments)    Has patient had a PCN reaction causing immediate rash, facial/tongue/throat swelling, SOB or lightheadedness with hypotension: no Has patient had a PCN reaction causing severe rash involving mucus membranes or skin necrosis: no Has patient had a PCN reaction that required hospitalization : no Has patient had a PCN reaction occurring within the last 10 years: no -Hallucinates If all of the above answers are "NO", then may proceed with Cephalosporin use.   . Simvastatin Other (See Comments)    Upset stomach   . Tessalon Perles Other (See Comments)    Gi upset     Prior to Admission medications   Medication Sig Start Date End Date Taking? Authorizing Provider  acetaminophen (TYLENOL) 500 MG tablet Take 500 mg by mouth every 6 (six) hours as needed for mild pain.   Yes [provider]  amlodipine-atorvastatin (CADUET) 10-10 MG tablet Take 1 tablet by mouth daily.   Yes [provider]  cephALEXin (KEFLEX) 500 MG capsule Take 1 capsule (500 mg total) by mouth 2 (two) times  daily. 08/30/18  Yes Rutherford Guys, MD  cholecalciferol (VITAMIN D) 1000 units tablet Take 1,000 Units by mouth daily.   Yes [provider]  ciprofloxacin (CIPRO) 500 MG tablet Take 1 tablet (500 mg total) by mouth 2 (two) times daily. 08/25/18  Yes Rutherford Guys, MD  clonazePAM (KLONOPIN) 1 MG tablet TAKE 1/2 TABLET BY MOUTH EVERY MORNING AND TAKE ONE TABLET BY MOUTH AT BEDTIME 01/07/19  Yes Rutherford Guys, MD  Colloidal Oatmeal (ECZEMA MOISTURIZING) 1 % LOTN Apply 1 application topically as needed. Eczema cream   Yes [provider]  diclofenac sodium (VOLTAREN) 1 % GEL APPLY TWO GRAMS TOPICALLY 4 (FOUR) TIMES DAILY. 08/25/18  Yes Rutherford Guys, MD  diltiazem  (CARDIZEM CD) 360 MG 24 hr capsule Take 1 capsule (360 mg total) by mouth daily. 08/25/18  Yes Rutherford Guys, MD  fluticasone (FLONASE) 50 MCG/ACT nasal spray PLACE 1 SPRAY INTO BOTH NOSTRILS DAILY Patient taking differently: Place 1 spray into both nostrils at bedtime.  01/10/18  Yes Shawnee Knapp, MD  furosemide (LASIX) 40 MG tablet Take 1 tablet (40 mg total) by mouth daily. 08/25/18  Yes Rutherford Guys, MD  glipiZIDE (GLUCOTROL) 10 MG tablet TAKE TWO TABLETS BY MOUTH TWICE A DAY 30 MINUTES BEFORE A MEAL 11/12/18  Yes Rutherford Guys, MD  levothyroxine (SYNTHROID, LEVOTHROID) 88 MCG tablet Take 1 tablet (88 mcg total) by mouth daily before breakfast. 08/25/18  Yes Rutherford Guys, MD  metoprolol tartrate (LOPRESSOR) 100 MG tablet Take 1 tablet (100 mg total) by mouth 2 (two) times daily. 08/25/18  Yes Rutherford Guys, MD  neomycin-polymyxin-dexameth (MAXITROL) 0.1 % OINT 1 application as needed. In her hear when needed for itching   Yes [provider]  omeprazole (PRILOSEC) 40 MG capsule Take 1 capsule (40 mg total) by mouth daily. 08/25/18  Yes Rutherford Guys, MD  polyethylene glycol [employer] / Floria Raveling) packet Take 8.5 g by mouth every morning. 09/18/18  Yes Rutherford Guys, MD  vitamin E 100 UNIT capsule Take 100 Units by mouth daily.   Yes [provider]    Past Medical History:  Diagnosis Date  . Abnormal EKG 02/07/2016   Inferolateral T wave inversion and ST depression.  . Acute calculous cholecystitis   . Anxiety   . Arthritis   . Chronic diastolic CHF (congestive heart failure) ([place]) 02/21/2018  . Colon polyps 11/2008   tubular adenoma  . Diabetes mellitus   . Esophageal problem    esophageal dilation  . GERD (gastroesophageal reflux disease)    + hpylori  . GI bleeding 01/2018  . Headache   . Hyperlipidemia   . Hypertension   . Hypothyroidism   . Ischemic colitis ([place])   . Renal cancer, right ([place]) 2010   s/p Rt nephrectomy by Dr. Rosana Hoes  . Renal  insufficiency     Past Surgical History:  Procedure Laterality Date  . ABDOMINAL HYSTERECTOMY  1978   partial  . CHOLECYSTECTOMY N/A 04/04/2016   Procedure: LAPAROSCOPIC CHOLECYSTECTOMY;  Surgeon: Autumn Messing III, MD;  Location: [facility];  Service: General;  Laterality: N/A;  . COLONOSCOPY    . ESOPHAGEAL DILATION  2016  . HERNIA REPAIR    . LAPAROSCOPIC CHOLECYSTECTOMY  04/04/2016  . NEPHRECTOMY Right 2010   Dr. Rosana Hoes, urology, due to renal cancer    Social History   Tobacco Use  . Smoking status: Current Some Day Smoker    Packs/day: 0.25  Years: 55.00    Pack years: 13.75    Types: Cigarettes  . Smokeless tobacco: Never Used  . Tobacco comment: cut back amount still  trying to quit  Substance Use Topics  . Alcohol use: No    Alcohol/week: 0.0 standard drinks    Family History  Problem Relation Age of Onset  . Cancer Mother        colon, kidney cancer  . Cancer Father        throat cancer  . Cancer Sister   . Heart attack Brother     Review of Systems  Constitutional: Negative for chills and fever.  Respiratory: Negative for cough and shortness of breath.   Cardiovascular: Negative for chest pain, palpitations and leg swelling.  Gastrointestinal: Negative for abdominal pain, nausea and vomiting.  Musculoskeletal: Positive for back pain.     OBJECTIVE:  Blood pressure 135/78, pulse 70, temperature (!) 97.5 F (36.4 C), height 5\' 4"  (1.626 m), weight 171 lb (77.6 kg), SpO2 100 %. Wt Readings from Last 3 Encounters:  01/30/19 171 lb (77.6 kg)  01/10/19 170 lb (77.1 kg)  08/25/18 168 lb 9.6 oz (76.5 kg)   Body mass index is 29.35 kg/m.   Physical Exam Vitals signs and nursing note reviewed.  Constitutional:      Appearance: She is well-developed.  HENT:     Head: Normocephalic and atraumatic.     Mouth/Throat:     Pharynx: No oropharyngeal exudate.  Eyes:     General: No scleral icterus.    Conjunctiva/sclera: Conjunctivae normal.     Pupils: Pupils  are equal, round, and reactive to light.  Neck:     Musculoskeletal: Neck supple.  Cardiovascular:     Rate and Rhythm: Normal rate and regular rhythm.     Heart sounds: Normal heart sounds. No murmur. No friction rub. No gallop.   Pulmonary:     Effort: Pulmonary effort is normal.     Breath sounds: Normal breath sounds. No wheezing or rales.  Lymphadenopathy:     Cervical: No cervical adenopathy.  Skin:    General: Skin is warm and dry.  Neurological:     Mental Status: She is alert and oriented to person, place, and time.      Diabetic Foot Exam - Simple   Simple Foot Form Visual Inspection No deformities, no ulcerations, no other skin breakdown bilaterally:  Yes Sensation Testing Intact to touch and monofilament testing bilaterally:  Yes Pulse Check Comments Felt all pricks with the filament. No broken skin     Results for orders placed or performed in visit on 01/30/19 (from the past 24 hour(s))  POCT glycosylated hemoglobin (Hb A1C)     Status: Abnormal   Collection Time: 01/30/19  2:49 PM  Result Value Ref Range   Hemoglobin A1C 7.4 (A) 4.0 - 5.6 %   HbA1c POC (<> result, manual entry)     HbA1c, POC (prediabetic range)     HbA1c, POC (controlled diabetic range)        ASSESSMENT and PLAN  1. Type 2 diabetes mellitus with diabetic polyneuropathy, without long-term current use of insulin (HCC) Controlled. Continue current regime.  - POCT glycosylated hemoglobin (Hb A1C) - Microalbumin / creatinine urine ratio - HM DIABETES FOOT EXAM - Ambulatory referral to Ophthalmology  2. Screening for osteoporosis - [facility] DEXA SCAN  3. Need for prophylactic vaccination and inoculation against influenza - Flu vaccine HIGH DOSE PF  4. Essential hypertension, benign Controlled. Continue  current regime.   5. CKD (chronic kidney disease), stage III (HCC) Stable. Managed by renal. Labs today - Comprehensive metabolic panel  6. Hypothyroidism, unspecified type Checking  labs today, medications will be adjusted as needed.  - TSH  7. Mixed hyperlipidemia Restarting atorvastatin 10mg  which she used to take on combo med - Lipid panel  8. Medication monitoring encounter - ToxASSURE Select 13 (MW), Urine  9. Long term prescription benzodiazepine use Stable. wo side effects. Takes for anxiety. Reviewed r/se/b. Risk of withdrawals outweigh risk of medication. pmp reviewed. Med  Refilled.  - ToxASSURE Select 13 (MW), Urine  10. Environmental allergies - fluticasone (FLONASE) 50 MCG/ACT nasal spray; Place 1-2 sprays into both nostrils at bedtime.  Other orders - amLODipine (NORVASC) 10 MG tablet; Take 1 tablet (10 mg total) by mouth daily. - clonazePAM (KLONOPIN) 1 MG tablet; TAKE 1/2 TABLET BY MOUTH EVERY MORNING AND TAKE ONE TABLET BY MOUTH AT BEDTIME - polyethylene glycol (MIRALAX / GLYCOLAX) packet; Take 17 g by mouth every morning.    Return in about 3 months (around 05/02/2019) for chronic medical conditions.    Rutherford Guys, MD [facility] [place], [place] [address] Ph.  [phone] Fax [phone]

## 2019-01-30 NOTE — Patient Instructions (Signed)
° ° ° °  If you have lab work done today you will be contacted with your lab results within the next 2 weeks.  If you have not heard from us then please contact us. The fastest way to get your results is to register for My Chart. ° ° °IF you received an x-ray today, you will receive an invoice from [facility]. Please contact [facility] at [phone] with questions or concerns regarding your invoice.  ° °IF you received labwork today, you will receive an invoice from [facility]. Please contact [facility] at [phone] with questions or concerns regarding your invoice.  ° °Our billing staff will not be able to assist you with questions regarding bills from these companies. ° °You will be contacted with the lab results as soon as they are available. The fastest way to get your results is to activate your My Chart account. Instructions are located on the last page of this paperwork. If you have not heard from us regarding the results in 2 weeks, please contact this office. °  ° ° ° °

## 2019-01-31 ENCOUNTER — Encounter: Payer: Self-pay | Admitting: Family Medicine

## 2019-01-31 LAB — COMPREHENSIVE METABOLIC PANEL
ALT: 10 IU/L (ref 0–32)
AST: 17 IU/L (ref 0–40)
Albumin/Globulin Ratio: 1.5 (ref 1.2–2.2)
Albumin: 4.4 g/dL (ref 3.7–4.7)
Alkaline Phosphatase: 74 IU/L (ref 39–117)
BUN/Creatinine Ratio: 11 — ABNORMAL LOW (ref 12–28)
BUN: 13 mg/dL (ref 8–27)
Bilirubin Total: 0.4 mg/dL (ref 0.0–1.2)
CO2: 23 mmol/L (ref 20–29)
Calcium: 9.8 mg/dL (ref 8.7–10.3)
Chloride: 103 mmol/L (ref 96–106)
Creatinine, Ser: 1.19 mg/dL — ABNORMAL HIGH (ref 0.57–1.00)
GFR calc Af Amer: 50 mL/min/{1.73_m2} — ABNORMAL LOW (ref >59)
GFR calc non Af Amer: 43 mL/min/{1.73_m2} — ABNORMAL LOW (ref >59)
Globulin, Total: 2.9 g/dL (ref 1.5–4.5)
Glucose: 151 mg/dL — ABNORMAL HIGH (ref 65–99)
Potassium: 4.1 mmol/L (ref 3.5–5.2)
Sodium: 146 mmol/L — ABNORMAL HIGH (ref 134–144)
Total Protein: 7.3 g/dL (ref 6.0–8.5)

## 2019-01-31 LAB — MICROALBUMIN / CREATININE URINE RATIO
Creatinine, Urine: 83.8 mg/dL
Microalb/Creat Ratio: 92 mg/g creat — ABNORMAL HIGH (ref 0–29)
Microalbumin, Urine: 76.8 ug/mL

## 2019-01-31 LAB — LIPID PANEL
Chol/HDL Ratio: 6 ratio — ABNORMAL HIGH (ref 0.0–4.4)
Cholesterol, Total: 241 mg/dL — ABNORMAL HIGH (ref 100–199)
HDL: 40 mg/dL (ref >39)
LDL Calculated: 144 mg/dL — ABNORMAL HIGH (ref 0–99)
Triglycerides: 284 mg/dL — ABNORMAL HIGH (ref 0–149)
VLDL Cholesterol Cal: 57 mg/dL — ABNORMAL HIGH (ref 5–40)

## 2019-01-31 LAB — TSH: TSH: 0.947 u[IU]/mL (ref 0.450–4.500)

## 2019-01-31 MED ORDER — LISINOPRIL 2.5 MG PO TABS: 2.5000 mg | ORAL_TABLET | Freq: Every day | ORAL | 1 refills | Status: DC

## 2019-01-31 MED ORDER — ATORVASTATIN CALCIUM 10 MG PO TABS: 10.0000 mg | ORAL_TABLET | Freq: Every day | ORAL | 3 refills | Status: DC

## 2019-02-05 LAB — TOXASSURE SELECT 13 (MW), URINE

## 2019-02-06 DIAGNOSIS — E119 Type 2 diabetes mellitus without complications: Secondary | ICD-10-CM | POA: Diagnosis not present

## 2019-02-10 DIAGNOSIS — H35033 Hypertensive retinopathy, bilateral: Secondary | ICD-10-CM | POA: Diagnosis not present

## 2019-02-10 DIAGNOSIS — H25013 Cortical age-related cataract, bilateral: Secondary | ICD-10-CM | POA: Diagnosis not present

## 2019-02-10 DIAGNOSIS — H2513 Age-related nuclear cataract, bilateral: Secondary | ICD-10-CM | POA: Diagnosis not present

## 2019-02-10 DIAGNOSIS — H40013 Open angle with borderline findings, low risk, bilateral: Secondary | ICD-10-CM | POA: Diagnosis not present

## 2019-02-10 LAB — HM DIABETES EYE EXAM

## 2019-02-11 ENCOUNTER — Other Ambulatory Visit: Payer: Self-pay | Admitting: Family Medicine

## 2019-02-11 DIAGNOSIS — I5032 Chronic diastolic (congestive) heart failure: Secondary | ICD-10-CM

## 2019-02-11 NOTE — Telephone Encounter (Signed)
Requested medication (s) are due for refill today: no  Requested medication (s) are on the active medication list: yes  Last refill:  08/25/18 for 90 tabs and 1 refill  Future visit scheduled: yes  Notes to clinic:  Diuretics - loop failed.  Requested Prescriptions  Pending Prescriptions Disp Refills   furosemide (LASIX) 40 MG tablet [Pharmacy Med Name: FUROSEMIDE 40MG  TAB] 90 tablet 0    Sig: TAKE ONE TABLET BY MOUTH DAILY     Cardiovascular:  Diuretics - Loop Failed - 02/11/2019  5:20 PM      Failed - Na in normal range and within 360 days    Sodium  Date Value Ref Range Status  01/30/2019 146 (H) 134 - 144 mmol/L Final         Failed - Cr in normal range and within 360 days    Creat  Date Value Ref Range Status  10/02/2016 1.45 (H) 0.60 - 0.93 mg/dL Final    Comment:      For patients > or = 81 years of age: The upper reference limit for Creatinine is approximately 13% higher for people identified as African-American.      Creatinine, Ser  Date Value Ref Range Status  01/30/2019 1.19 (H) 0.57 - 1.00 mg/dL Final         Passed - K in normal range and within 360 days    Potassium  Date Value Ref Range Status  01/30/2019 4.1 3.5 - 5.2 mmol/L Final         Passed - Ca in normal range and within 360 days    Calcium  Date Value Ref Range Status  01/30/2019 9.8 8.7 - 10.3 mg/dL Final         Passed - Last BP in normal range    BP Readings from Last 1 Encounters:  01/30/19 135/78         Passed - Valid encounter within last 6 months    Recent Outpatient Visits          1 week ago Type 2 diabetes mellitus with diabetic polyneuropathy, without long-term current use of insulin ([place])   [facility] Lilia Argue, MD   5 months ago Lower abdominal pain   [facility], Lilia Argue, MD   7 months ago Lower abdominal pain   [facility], Lilia Argue, MD   8 months ago Hypercalcemia   [facility], Lilia Argue,  MD   9 months ago Acute pain of right shoulder   [facility], Lilia Argue, MD      Future Appointments            In 2 months Rutherford Guys, MD [facility]

## 2019-02-12 ENCOUNTER — Ambulatory Visit: Payer: Self-pay

## 2019-02-12 NOTE — Telephone Encounter (Signed)
Pt called to say she has swelling to her right breast vaginal area, buttock and abdomin.  She stats these areas are not tight they are mushy. She states that she has only one kidney and feels this is a kidney problem. She is not having pain now but had burning pain to her stomach last night.  She states that she is up voiding all night but has only dribbled today with her BM. She states she is sweaty.  She denies chest pain and difficulty breathing.  She states it is hard to walk but denies swelling to her legs. Appointment scheduled per protocol.  Care advice read to patient. Pt verbalized understanding of all instructions. Pt was urged to call back if she develops SOB or is unable to void.  Reason for Disposition . [1] MILD swelling of both arms AND [2] new onset or worsening  Answer Assessment - Initial Assessment Questions 1. ONSET: "When did the swelling start?" (e.g., minutes, hours, days)     Last night 2. LOCATION: "What part of the arm is swollen?"  "Are both arms swollen or just one arm?"     Arms, breast groin stomach 3. SEVERITY: "How bad is the swelling?" (e.g., localized; mild, moderate, severe)   - LOCALIZED: Small area of puffiness or swelling on just one arm   - JOINT SWELLING: Swelling of one joint   - MILD: Puffiness or swelling of hand   - MODERATE: Puffiness or swollen feeling of entire arm    - SEVERE: All of arm looks swollen; pitting edema     scattered swelling over breast buttock rt and left groin 4. REDNESS: "Does the swelling look red or infected?"     Under tongue is red since new medication 5. PAIN: "Is the swelling painful to touch?" If so, ask: "How painful is it?"   (Scale 1-10; mild, moderate or severe)    Last night 10 burning pain 6. FEVER: "Do you have a fever?" If so, ask: "What is it, how was it measured, and when did it start?"     Unsure but sweaty 7. CAUSE: "What do you think is causing the arm swelling?"     Thinks it is her kidney 8. MEDICAL  HISTORY: "Do you have a history of heart failure, kidney disease, liver failure, or cancer?"     Kidney dx. Only has 1 kidney 9. RECURRENT SYMPTOM: "Have you had arm swelling before?" If so, ask: "When was the last time?" "What happened that time?"     no 10. OTHER SYMPTOMS: "Do you have any other symptoms?" (e.g., chest pain, difficulty breathing)      Pain to rt breast  11. PREGNANCY: "Is there any chance you are pregnant?" "When was your last menstrual period?"       N/A  Protocols used: ARM SWELLING AND EDEMA-A-AH

## 2019-02-13 ENCOUNTER — Ambulatory Visit: Payer: Self-pay | Admitting: Family Medicine

## 2019-02-16 ENCOUNTER — Other Ambulatory Visit: Payer: Self-pay

## 2019-02-16 ENCOUNTER — Encounter: Payer: Self-pay | Admitting: Family Medicine

## 2019-02-16 ENCOUNTER — Ambulatory Visit (INDEPENDENT_AMBULATORY_CARE_PROVIDER_SITE_OTHER): Payer: Medicare Other | Admitting: Family Medicine

## 2019-02-16 VITALS — BP 120/78 | HR 71 | Temp 97.7°F | Resp 14 | Ht 64.0 in | Wt 167.6 lb

## 2019-02-16 DIAGNOSIS — T7840XA Allergy, unspecified, initial encounter: Secondary | ICD-10-CM

## 2019-02-16 DIAGNOSIS — R601 Generalized edema: Secondary | ICD-10-CM

## 2019-02-16 DIAGNOSIS — T783XXA Angioneurotic edema, initial encounter: Secondary | ICD-10-CM | POA: Diagnosis not present

## 2019-02-16 DIAGNOSIS — E785 Hyperlipidemia, unspecified: Secondary | ICD-10-CM

## 2019-02-16 DIAGNOSIS — R109 Unspecified abdominal pain: Secondary | ICD-10-CM | POA: Diagnosis not present

## 2019-02-16 DIAGNOSIS — K5900 Constipation, unspecified: Secondary | ICD-10-CM | POA: Diagnosis not present

## 2019-02-16 DIAGNOSIS — I10 Essential (primary) hypertension: Secondary | ICD-10-CM

## 2019-02-16 NOTE — Patient Instructions (Addendum)
Avoid ace inhibitors for now, like lisinopril.  It is less likely that the atorvastatin caused swelling, but will hold on new meds for now. Follow up with primary provider in 3 months and she can advise on trying statin again and possible ARB blood pressure med to help protect kidney.  This can also be discussed with your nephrologist.   Abdominal discomfort may be due to constipation.  Restart MiraLAX, and if abdominal symptoms and appetite are not improving, I recommend you be evaluated by medical provider.  Return to the clinic or go to the nearest emergency room if any of your symptoms worsen or new symptoms occur.   Angioedema Angioedema is the sudden swelling of tissue in the body. Angioedema can affect any part of the body, but it most often affects the deeper parts of the skin, causing red, itchy patches (hives) to appear over the affected area. It often begins during the night and is found in the morning. Depending on the cause, angioedema may happen:  Only once.  Several times. It may come back in unpredictable patterns.  Repeatedly for several years. Over time, it may gradually stop coming back. Angioedema can be life-threatening if it affects the air passages that you breathe through. What are the causes? This condition may be caused by:  Foods, such as milk, eggs, shellfish, wheat, or nuts.  Certain medicines, such as ACE inhibitors, antibiotics, nonsteroidal anti-inflammatory drugs, birth control pills, or dyes used in X-rays.  Insect stings.  Infections. Angioedema can be inherited, and episodes can be triggered by:  Mild injury.  Dental work.  Surgery.  Stress.  Sudden changes in temperature.  Exercise. In some cases, the cause of this condition is not known. What are the signs or symptoms? Symptoms of this condition depend on where the swelling happens. Symptoms may include:  Swollen skin.  Red, itchy patches of skin (hives).  Redness in the  affected area.  Pain in the affected area.  Swollen lips or tongue.  Wheezing.  Breathing problems.  If your internal organs are involved, symptoms may also include:  Nausea.  Abdominal pain.  Vomiting.  Difficulty swallowing.  Difficulty passing urine. How is this diagnosed? This condition may be diagnosed based on:  An exam of the affected area.  Your medical history.  Whether anyone in your family has had this condition before.  A review of any medicines you have been taking.  Tests, including: ? Allergy skin tests to see if the condition was caused by an allergic reaction. ? Blood tests to see if the condition was caused by a gene. ? Tests to check for underlying diseases that could cause the condition. How is this treated? Treatment for this condition depends on the cause. It may involve any of the following:  If something triggered the condition, making changes to keep it from triggering the condition again.  If the condition affects your breathing, having tubes placed in your airway to keep it open.  Taking medicines to treat symptoms or prevent future episodes. These may include: ? Antihistamines. ? Epinephrine injections. ? Steroids. If your condition is severe, you may need to be treated at the hospital. Angioedema usually gets better in 24-48 hours. Follow these instructions at home:  Take over-the-counter and prescription medicines only as told by your health care provider.  If you were given medicines for emergency allergy treatment, always carry them with you.  Wear a medical bracelet as told by your health care provider.  If something  triggers your condition, avoid the trigger, if possible.  If your condition is inherited and you are thinking about having children, talk to your health care provider. It is important to discuss the risks of passing on the condition to your children. Contact a health care provider if:  You have repeated  episodes of angioedema.  Episodes of angioedema start to happen more often than they used to, even after you take steps to prevent them.  You have episodes of angioedema that are more severe than they have been before, even after you take steps to prevent them.  You are thinking about having children. Get help right away if:  You have severe swelling of your mouth, tongue, or lips.  You have trouble breathing.  You have trouble swallowing.  You faint. This information is not intended to replace advice given to you by your health care provider. Make sure you discuss any questions you have with your health care provider. Document Released: 01/21/2002 Document Revised: 06/09/2016 Document Reviewed: 05/22/2016 Elsevier Interactive Patient Education  2019 [employer]   Abdominal Pain, Adult Abdominal pain can be caused by many things. Often, abdominal pain is not serious and it gets better with no treatment or by being treated at home. However, sometimes abdominal pain is serious. Your health care provider will do a medical history and a physical exam to try to determine the cause of your abdominal pain. Follow these instructions at home:  Take over-the-counter and prescription medicines only as told by your health care provider. Do not take a laxative unless told by your health care provider.  Drink enough fluid to keep your urine clear or pale yellow.  Watch your condition for any changes.  Keep all follow-up visits as told by your health care provider. This is important. Contact a health care provider if:  Your abdominal pain changes or gets worse.  You are not hungry or you lose weight without trying.  You are constipated or have diarrhea for more than 2-3 days.  You have pain when you urinate or have a bowel movement.  Your abdominal pain wakes you up at night.  Your pain gets worse with meals, after eating, or with certain foods.  You are throwing up and cannot keep  anything down.  You have a fever. Get help right away if:  Your pain does not go away as soon as your health care provider told you to expect.  You cannot stop throwing up.  Your pain is only in areas of the abdomen, such as the right side or the left lower portion of the abdomen.  You have bloody or black stools, or stools that look like tar.  You have severe pain, cramping, or bloating in your abdomen.  You have signs of dehydration, such as: ? Dark urine, very little urine, or no urine. ? Cracked lips. ? Dry mouth. ? Sunken eyes. ? Sleepiness. ? Weakness. This information is not intended to replace advice given to you by your health care provider. Make sure you discuss any questions you have with your health care provider. Document Released: 08/22/2005 Document Revised: 06/01/2016 Document Reviewed: 04/25/2016 Elsevier Interactive Patient Education  [employer].   If you have lab work done today you will be contacted with your lab results within the next 2 weeks.  If you have not heard from [place] then please contact us. The fastest way to get your results is to register for My Chart.   IF you received an  x-ray today, you will receive an invoice from [facility]. Please contact [facility] at [phone] with questions or concerns regarding your invoice.   IF you received labwork today, you will receive an invoice from [facility]. Please contact [facility] at [phone] with questions or concerns regarding your invoice.   Our billing staff will not be able to assist you with questions regarding bills from these companies.  You will be contacted with the lab results as soon as they are available. The fastest way to get your results is to activate your My Chart account. Instructions are located on the last page of this paperwork. If you have not heard from [place] regarding the results in 2 weeks, please contact this office.

## 2019-02-16 NOTE — Progress Notes (Signed)
Subjective:    Patient ID: Sherri Burns, female    DOB: [birth year], 83 y.o.   MRN: [id number]  HPI Sherri Burns is a 83 y.o. female Presents today for: Chief Complaint  Patient presents with   Edema    was seen 02/12/19 wt then was 171.0 was put on atorvastatin, lisinopril by Dr Pamella Pert called down here on 02/12/19 they told me to come in to be seen for the swelling. Patient stated she took it upon herself to stop these meds due to the swelling. Swelling all over. have been off these meds for 3-4 days now. Todays visit wt was 167.6   History of chronic diastolic CHF, CKD with history of nephrectomy followed by nephrology, diabetes, hypertension, hypothyroidism.  Other past medical history as below.  Presents today with concerns of swelling. Most recently seen March 6.  Restarted atorvastatin 10 mg daily at that time, nephrology note from January reviewed, creatinine 1.43, albumin 4.9.  Total protein 8.0.  She was started on amlodipine 10 mg daily at that visit.  Euvolemic based on nephrology note, weight at that time was 170 with a blood pressure of 156/100.  Myocardial perfusion scan March 2017, EF 63%, low risk study.  Proteinuria noted in urine at last visit, added lisinopril. Cholesterol elevated - started on lipitor.   Feels like swelling all over - face, ears, legs, tongue swollen and red, stomach, vagina swelling since starting the lisinopril 2.5mg  and lipitor 10mg  since 02/01/18. No dyspnea, but thinks trouble swallowing as felt like in throat as well.  Symptoms started 2 days after starting above. No other new meds. Stopped meds right away.  Off lisinopril and lipitor past  week and half. Swelling improved off meds, feels like almost back to normal. No further difficulty swallowing, tongue or face swelling. Still some decreased appetite. No vomiting. Gagged once during swelling. Ears numb, less hearing during above symptoms but ok now.  Burning pain in legs - left thigh, left  leg and R leg at times. Later in visit - pain shot all over body. Has improved.  Some abd pains in past - discussed in past with Dr. Pamella Pert - similar sx's as in past. No fever. No fever, no vomiting. BM once per day usually - miralax daily, but not recently. Last BM 2-3 days ago.   Still taking amlodipine without issue.   Allergy list of statins - simvastatin - stomach upset, crestor - cramps. No listing for lipitor.     Wt Readings from Last 3 Encounters:  02/16/19 167 lb 9.6 oz (76 kg)  01/30/19 171 lb (77.6 kg)  01/10/19 170 lb (77.1 kg)      Patient Active Problem List   Diagnosis Date Noted   S/p nephrectomy 06/24/2018   Chronic diastolic CHF (congestive heart failure) ([place]) 02/21/2018   Acute lower GI bleeding 02/20/2018   Hyperlipidemia    Abnormal EKG 02/07/2016   Type 2 diabetes mellitus with diabetic polyneuropathy, without long-term current use of insulin ([place]) 09/23/2012   Essential hypertension    Hypothyroidism    CKD (chronic kidney disease), stage III ([facility])    Past Medical History:  Diagnosis Date   Abnormal EKG 02/07/2016   Inferolateral T wave inversion and ST depression.   Acute calculous cholecystitis    Anxiety    Arthritis    Chronic diastolic CHF (congestive heart failure) ([place]) 02/21/2018   Colon polyps 11/2008   tubular adenoma   Diabetes mellitus    Esophageal problem  esophageal dilation   GERD (gastroesophageal reflux disease)    + hpylori   GI bleeding 01/2018   Headache    Hyperlipidemia    Hypertension    Hypothyroidism    Ischemic colitis [facility])    Renal cancer, right ([place]) 2010   s/p Rt nephrectomy by Dr. Rosana Hoes   Renal insufficiency    Past Surgical History:  Procedure Laterality Date   ABDOMINAL HYSTERECTOMY  1978   partial   CHOLECYSTECTOMY N/A 04/04/2016   Procedure: LAPAROSCOPIC CHOLECYSTECTOMY;  Surgeon: Autumn Messing III, MD;  Location: [facility];  Service: General;  Laterality: N/A;    COLONOSCOPY     ESOPHAGEAL DILATION  2016   HERNIA REPAIR     LAPAROSCOPIC CHOLECYSTECTOMY  04/04/2016   NEPHRECTOMY Right 2010   Dr. Rosana Hoes, urology, due to renal cancer   Allergies  Allergen Reactions   Codeine Other (See Comments)    hallucinations   Metformin And Related     Upset stomach   Ciprocin-Fluocin-Procin [Fluocinolone Acetonide] Other (See Comments)    Unknown reaction   Clonidine Derivatives Other (See Comments)    Dry mouth   Crestor [Rosuvastatin Calcium] Other (See Comments)    cramps   Penicillins Other (See Comments)    Has patient had a PCN reaction causing immediate rash, facial/tongue/throat swelling, SOB or lightheadedness with hypotension: no Has patient had a PCN reaction causing severe rash involving mucus membranes or skin necrosis: no Has patient had a PCN reaction that required hospitalization : no Has patient had a PCN reaction occurring within the last 10 years: no -Hallucinates If all of the above answers are "NO", then may proceed with Cephalosporin use.    Simvastatin Other (See Comments)    Upset stomach    Tessalon Perles Other (See Comments)    Gi upset    Prior to Admission medications   Medication Sig Start Date End Date Taking? Authorizing Provider  amLODipine (NORVASC) 10 MG tablet Take 1 tablet (10 mg total) by mouth daily. 01/30/19  Yes Rutherford Guys, MD  clonazePAM (KLONOPIN) 1 MG tablet TAKE 1/2 TABLET BY MOUTH EVERY MORNING AND TAKE ONE TABLET BY MOUTH AT BEDTIME 01/30/19  Yes Rutherford Guys, MD  fluticasone [facility]) 50 MCG/ACT nasal spray Place 1-2 sprays into both nostrils at bedtime. 01/30/19  Yes Rutherford Guys, MD  glipiZIDE (GLUCOTROL) 10 MG tablet TAKE TWO TABLETS BY MOUTH TWICE A DAY 30 MINUTES BEFORE A MEAL 11/12/18  Yes Rutherford Guys, MD  levothyroxine (SYNTHROID, LEVOTHROID) 88 MCG tablet Take 1 tablet (88 mcg total) by mouth daily before breakfast. 08/25/18  Yes Rutherford Guys, MD  metoprolol tartrate  (LOPRESSOR) 100 MG tablet Take 1 tablet (100 mg total) by mouth 2 (two) times daily. 08/25/18  Yes Rutherford Guys, MD  omeprazole (PRILOSEC) 40 MG capsule Take 1 capsule (40 mg total) by mouth daily. 08/25/18  Yes Rutherford Guys, MD  polyethylene glycol [facility] Floria Raveling) packet Take 17 g by mouth every morning. 01/30/19  Yes Rutherford Guys, MD  vitamin E 100 UNIT capsule Take 100 Units by mouth daily.   Yes [provider]  atorvastatin (LIPITOR) 10 MG tablet Take 1 tablet (10 mg total) by mouth daily. Patient not taking: Reported on 02/16/2019 01/31/19   Rutherford Guys, MD  cholecalciferol (VITAMIN D) 1000 units tablet Take 1,000 Units by mouth daily.    [provider]  Colloidal Oatmeal (ECZEMA MOISTURIZING) 1 % LOTN Apply 1 application topically  as needed. Eczema cream    [provider]  diclofenac sodium (VOLTAREN) 1 % GEL APPLY TWO GRAMS TOPICALLY 4 (FOUR) TIMES DAILY. Patient not taking: Reported on 02/16/2019 08/25/18   Rutherford Guys, MD  furosemide (LASIX) 40 MG tablet TAKE ONE TABLET BY MOUTH DAILY 02/12/19   Rutherford Guys, MD  lisinopril (PRINIVIL,ZESTRIL) 2.5 MG tablet Take 1 tablet (2.5 mg total) by mouth daily. Patient not taking: Reported on 02/16/2019 01/31/19   Rutherford Guys, MD   Social History   Socioeconomic History   Marital status: Widowed    Spouse name: Not on file   Number of children: 2   Years of education: Not on file   Highest education level: Not on file  Occupational History   Not on file  Social Needs   Financial resource strain: Not on file   Food insecurity:    Worry: Not on file    Inability: Not on file   Transportation needs:    Medical: Not on file    Non-medical: Not on file  Tobacco Use   Smoking status: Current Some Day Smoker    Packs/day: 0.25    Years: 55.00    Pack years: 13.75    Types: Cigarettes   Smokeless tobacco: Never Used   Tobacco comment: cut back amount still  trying to quit    Substance and Sexual Activity   Alcohol use: No    Alcohol/week: 0.0 standard drinks   Drug use: No   Sexual activity: Not Currently  Lifestyle   Physical activity:    Days per week: Not on file    Minutes per session: Not on file   Stress: Not on file  Relationships   Social connections:    Talks on phone: Not on file    Gets together: Not on file    Attends religious service: Not on file    Active member of club or organization: Not on file    Attends meetings of clubs or organizations: Not on file    Relationship status: Not on file   Intimate partner violence:    Fear of current or ex partner: Not on file    Emotionally abused: Not on file    Physically abused: Not on file    Forced sexual activity: Not on file  Other Topics Concern   Not on file  Social History Narrative   Loss of son.    Review of Systems Per HPI.      Objective:   Physical Exam Vitals signs reviewed.  Constitutional:      Appearance: She is well-developed.  HENT:     Head: Normocephalic and atraumatic.  Eyes:     Conjunctiva/sclera: Conjunctivae normal.     Pupils: Pupils are equal, round, and reactive to light.  Neck:     Vascular: No carotid bruit.  Cardiovascular:     Rate and Rhythm: Normal rate and regular rhythm.     Heart sounds: Normal heart sounds.  Pulmonary:     Effort: Pulmonary effort is normal.     Breath sounds: Normal breath sounds.  Abdominal:     General: There is no distension.     Palpations: Abdomen is soft. There is no pulsatile mass.     Tenderness: There is abdominal tenderness (diffuse slight ttp. no focal TTP). There is no rebound.  Skin:    General: Skin is warm and dry.  Neurological:     Mental Status: She is alert and  oriented to person, place, and time.  Psychiatric:        Behavior: Behavior normal.    Vitals:   02/16/19 1045  BP: 120/78  Pulse: 71  Resp: 14  Temp: 97.7 F (36.5 C)  TempSrc: Oral  SpO2: 96%  Weight: 167 lb 9.6 oz  (76 kg)  Height: 5\' 4"  (1.626 m)       Assessment & Plan:   Sherri Burns is a 83 y.o. female Allergic reaction, initial encounter Generalized edema Angioedema, initial encounter  -Based on described symptoms, suspect allergic reaction, probable angioedema, and most likely cause would be ACE inhibitor, not statin.  However as she associated those reactions to start of both medicines, will hold on both of them at this time, avoid any new meds for the next month or 2, then consider restarting statin, and possible ARB, but that can be decided by her primary care provider.  RTC precautions if any recurrence of symptoms/ER precautions.  Abdominal pain, unspecified abdominal location Constipation, unspecified constipation type  -Reports previous abdominal pain, decreased bowel movements recently, possible constipation component. Did not appreciate significant distention on exam and afebrile.  -Trial of MiraLAX, then if abdominal pain persists advised to be evaluated, sooner if worsening.  Essential hypertension  -Stable on current regimen, no additional meds added.  Plan for potential ARB as above.  Hyperlipidemia, unspecified hyperlipidemia type  -Hold on statins at this time.  I did discuss potential benefit with statin diabetes but with age and possible reaction as above, will hold on new statin for now, to be decided further with primary care provider next visit.  No orders of the defined types were placed in this encounter.  Patient Instructions    Avoid ace inhibitors for now, like lisinopril.  It is less likely that the atorvastatin caused swelling, but will hold on new meds for now. Follow up with primary provider in 3 months and she can advise on trying statin again and possible ARB blood pressure med to help protect kidney.  This can also be discussed with your nephrologist.   Abdominal discomfort may be due to constipation.  Restart MiraLAX, and if abdominal symptoms and  appetite are not improving, I recommend you be evaluated by medical provider.  Return to the clinic or go to the nearest emergency room if any of your symptoms worsen or new symptoms occur.   Angioedema Angioedema is the sudden swelling of tissue in the body. Angioedema can affect any part of the body, but it most often affects the deeper parts of the skin, causing red, itchy patches (hives) to appear over the affected area. It often begins during the night and is found in the morning. Depending on the cause, angioedema may happen:  Only once.  Several times. It may come back in unpredictable patterns.  Repeatedly for several years. Over time, it may gradually stop coming back. Angioedema can be life-threatening if it affects the air passages that you breathe through. What are the causes? This condition may be caused by:  Foods, such as milk, eggs, shellfish, wheat, or nuts.  Certain medicines, such as ACE inhibitors, antibiotics, nonsteroidal anti-inflammatory drugs, birth control pills, or dyes used in X-rays.  Insect stings.  Infections. Angioedema can be inherited, and episodes can be triggered by:  Mild injury.  Dental work.  Surgery.  Stress.  Sudden changes in temperature.  Exercise. In some cases, the cause of this condition is not known. What are the signs or symptoms? Symptoms  of this condition depend on where the swelling happens. Symptoms may include:  Swollen skin.  Red, itchy patches of skin (hives).  Redness in the affected area.  Pain in the affected area.  Swollen lips or tongue.  Wheezing.  Breathing problems.  If your internal organs are involved, symptoms may also include:  Nausea.  Abdominal pain.  Vomiting.  Difficulty swallowing.  Difficulty passing urine. How is this diagnosed? This condition may be diagnosed based on:  An exam of the affected area.  Your medical history.  Whether anyone in your family has had this  condition before.  A review of any medicines you have been taking.  Tests, including: ? Allergy skin tests to see if the condition was caused by an allergic reaction. ? Blood tests to see if the condition was caused by a gene. ? Tests to check for underlying diseases that could cause the condition. How is this treated? Treatment for this condition depends on the cause. It may involve any of the following:  If something triggered the condition, making changes to keep it from triggering the condition again.  If the condition affects your breathing, having tubes placed in your airway to keep it open.  Taking medicines to treat symptoms or prevent future episodes. These may include: ? Antihistamines. ? Epinephrine injections. ? Steroids. If your condition is severe, you may need to be treated at the hospital. Angioedema usually gets better in 24-48 hours. Follow these instructions at home:  Take over-the-counter and prescription medicines only as told by your health care provider.  If you were given medicines for emergency allergy treatment, always carry them with you.  Wear a medical bracelet as told by your health care provider.  If something triggers your condition, avoid the trigger, if possible.  If your condition is inherited and you are thinking about having children, talk to your health care provider. It is important to discuss the risks of passing on the condition to your children. Contact a health care provider if:  You have repeated episodes of angioedema.  Episodes of angioedema start to happen more often than they used to, even after you take steps to prevent them.  You have episodes of angioedema that are more severe than they have been before, even after you take steps to prevent them.  You are thinking about having children. Get help right away if:  You have severe swelling of your mouth, tongue, or lips.  You have trouble breathing.  You have trouble  swallowing.  You faint. This information is not intended to replace advice given to you by your health care provider. Make sure you discuss any questions you have with your health care provider. Document Released: 01/21/2002 Document Revised: 06/09/2016 Document Reviewed: 05/22/2016 Elsevier Interactive Patient Education  2019 Elsevier Inc.   Abdominal Pain, Adult Abdominal pain can be caused by many things. Often, abdominal pain is not serious and it gets better with no treatment or by being treated at home. However, sometimes abdominal pain is serious. Your health care provider will do a medical history and a physical exam to try to determine the cause of your abdominal pain. Follow these instructions at home:  Take over-the-counter and prescription medicines only as told by your health care provider. Do not take a laxative unless told by your health care provider.  Drink enough fluid to keep your urine clear or pale yellow.  Watch your condition for any changes.  Keep all follow-up visits as told by your  health care provider. This is important. Contact a health care provider if:  Your abdominal pain changes or gets worse.  You are not hungry or you lose weight without trying.  You are constipated or have diarrhea for more than 2-3 days.  You have pain when you urinate or have a bowel movement.  Your abdominal pain wakes you up at night.  Your pain gets worse with meals, after eating, or with certain foods.  You are throwing up and cannot keep anything down.  You have a fever. Get help right away if:  Your pain does not go away as soon as your health care provider told you to expect.  You cannot stop throwing up.  Your pain is only in areas of the abdomen, such as the right side or the left lower portion of the abdomen.  You have bloody or black stools, or stools that look like tar.  You have severe pain, cramping, or bloating in your abdomen.  You have signs of  dehydration, such as: ? Dark urine, very little urine, or no urine. ? Cracked lips. ? Dry mouth. ? Sunken eyes. ? Sleepiness. ? Weakness. This information is not intended to replace advice given to you by your health care provider. Make sure you discuss any questions you have with your health care provider. Document Released: 08/22/2005 Document Revised: 06/01/2016 Document Reviewed: 04/25/2016 Elsevier Interactive Patient Education  [employer].   If you have lab work done today you will be contacted with your lab results within the next 2 weeks.  If you have not heard from [place] then please contact us. The fastest way to get your results is to register for My Chart.   IF you received an x-ray today, you will receive an invoice from [facility]. Please contact [facility] at [phone] with questions or concerns regarding your invoice.   IF you received labwork today, you will receive an invoice from [facility]. Please contact [facility] at [phone] with questions or concerns regarding your invoice.   Our billing staff will not be able to assist you with questions regarding bills from these companies.  You will be contacted with the lab results as soon as they are available. The fastest way to get your results is to activate your My Chart account. Instructions are located on the last page of this paperwork. If you have not heard from [place] regarding the results in 2 weeks, please contact this office.       Signed,   Merri Ray, MD [facility] [place].  02/17/19 10:37 AM

## 2019-03-12 ENCOUNTER — Other Ambulatory Visit: Payer: Self-pay | Admitting: Family Medicine

## 2019-03-12 DIAGNOSIS — E038 Other specified hypothyroidism: Secondary | ICD-10-CM

## 2019-03-12 DIAGNOSIS — K219 Gastro-esophageal reflux disease without esophagitis: Secondary | ICD-10-CM

## 2019-03-12 NOTE — Telephone Encounter (Signed)
Requested Prescriptions  Pending Prescriptions Disp Refills  . polyethylene glycol powder (GLYCOLAX/MIRALAX) 17 GM/SCOOP powder [Pharmacy Med Name: POLYETHYLENE GLYCOL 3350 17G PWD] 255 g 0    Sig: TAKE 17 G BY MOUTH EVERY MORNING.     Gastroenterology:  Laxatives Passed - 03/12/2019  3:27 PM      Passed - Valid encounter within last 12 months    Recent Outpatient Visits          3 weeks ago Allergic reaction, initial encounter   [facility], Ranell Patrick, MD   1 month ago Type 2 diabetes mellitus with diabetic polyneuropathy, without long-term current use of insulin [facility])   [facility], Lilia Argue, MD   6 months ago Lower abdominal pain   [facility], Lilia Argue, MD   8 months ago Lower abdominal pain   [facility], Lilia Argue, MD   9 months ago Hypercalcemia   [facility], Lilia Argue, MD      Future Appointments            In 1 month Rutherford Guys, MD [facility] [place], [facility]         . glipiZIDE (GLUCOTROL) 10 MG tablet [Pharmacy Med Name: GLIPIZIDE 10MG  TAB] 360 tablet 0    Sig: TAKE TWO TABLETS BY MOUTH TWICE A DAY 30 MINUTES BEFORE A MEAL     Endocrinology:  Diabetes - Sulfonylureas Passed - 03/12/2019  3:27 PM      Passed - HBA1C is between 0 and 7.9 and within 180 days    Hemoglobin A1C  Date Value Ref Range Status  01/30/2019 7.4 (A) 4.0 - 5.6 % Final   Hgb A1c MFr Bld  Date Value Ref Range Status  08/25/2018 7.4 (H) 4.8 - 5.6 % Final    Comment:             Prediabetes: 5.7 - 6.4          Diabetes: >6.4          Glycemic control for adults with diabetes: <7.0          Passed - Valid encounter within last 6 months    Recent Outpatient Visits          3 weeks ago Allergic reaction, initial encounter   [facility], Ranell Patrick, MD   1 month ago Type 2 diabetes mellitus with diabetic polyneuropathy, without long-term current use of insulin [facility])   [facility], Lilia Argue, MD   6 months ago Lower abdominal pain   [facility], Lilia Argue, MD   8 months ago Lower abdominal pain   [facility], Lilia Argue, MD   9 months ago Hypercalcemia   [facility], Lilia Argue, MD      Future Appointments            In 1 month Rutherford Guys, MD [facility] at [place], [facility]         . omeprazole (PRILOSEC) 40 MG capsule [Pharmacy Med Name: OMEPRAZOLE 40MG  CER] 90 capsule 3    Sig: TAKE ONE CAPSULE BY MOUTH DAILY     Gastroenterology: Proton Pump Inhibitors Passed - 03/12/2019  3:27 PM      Passed - Valid encounter within last 12 months    Recent Outpatient Visits  3 weeks ago Allergic reaction, initial encounter   [facility], Ranell Patrick, MD   1 month ago Type 2 diabetes mellitus with diabetic polyneuropathy, without long-term current use of insulin [facility])   [facility], Lilia Argue, MD   6 months ago Lower abdominal pain   [facility], Lilia Argue, MD   8 months ago Lower abdominal pain   [facility], Lilia Argue, MD   9 months ago Hypercalcemia   [facility], Lilia Argue, MD      Future Appointments            In 1 month Rutherford Guys, MD [facility] at [place], [facility]         . levothyroxine (SYNTHROID) 88 MCG tablet [Pharmacy Med Name: LEVOTHYROXINE SODIUM 88MCG TAB] 90 tablet 2    Sig: TAKE ONE TABLET BY MOUTH DAILY BEFORE BREAKFAST     Endocrinology:  Hypothyroid Agents Failed - 03/12/2019  3:27 PM      Failed - TSH needs to be rechecked within 3 months after an abnormal result. Refill until TSH is due.      Passed - TSH in normal range and within 360 days    TSH  Date Value Ref Range Status  01/30/2019 0.947 0.450 - 4.500 uIU/mL Final         Passed - Valid encounter within last 12 months    Recent Outpatient Visits          3 weeks ago Allergic reaction, initial encounter    [facility], Ranell Patrick, MD   1 month ago Type 2 diabetes mellitus with diabetic polyneuropathy, without long-term current use of insulin [facility])   [facility], Lilia Argue, MD   6 months ago Lower abdominal pain   [facility], Lilia Argue, MD   8 months ago Lower abdominal pain   [facility], Lilia Argue, MD   9 months ago Hypercalcemia   [facility], Lilia Argue, MD      Future Appointments            In 1 month Rutherford Guys, MD [facility] at [place], [facility]

## 2019-03-18 ENCOUNTER — Other Ambulatory Visit: Payer: Self-pay | Admitting: Nephrology

## 2019-03-18 ENCOUNTER — Telehealth: Payer: Self-pay | Admitting: Family Medicine

## 2019-03-18 ENCOUNTER — Other Ambulatory Visit: Payer: Self-pay | Admitting: Family Medicine

## 2019-03-18 DIAGNOSIS — K219 Gastro-esophageal reflux disease without esophagitis: Secondary | ICD-10-CM

## 2019-03-18 DIAGNOSIS — E038 Other specified hypothyroidism: Secondary | ICD-10-CM

## 2019-03-18 NOTE — Telephone Encounter (Signed)
Called pharmacy on file and pharmacist stated they did not get last electronic request 6 days ago. Resending. Requested Prescriptions  Pending Prescriptions Disp Refills  . levothyroxine (SYNTHROID) 88 MCG tablet [Pharmacy Med Name: LEVOTHYROXINE SODIUM 88MCG TAB] 90 tablet 0    Sig: TAKE ONE TABLET BY MOUTH DAILY BEFORE BREAKFAST     Endocrinology:  Hypothyroid Agents Failed - 03/18/2019  4:40 PM      Failed - TSH needs to be rechecked within 3 months after an abnormal result. Refill until TSH is due.      Passed - TSH in normal range and within 360 days    TSH  Date Value Ref Range Status  01/30/2019 0.947 0.450 - 4.500 uIU/mL Final         Passed - Valid encounter within last 12 months    Recent Outpatient Visits          1 month ago Allergic reaction, initial encounter   [facility], Ranell Patrick, MD   1 month ago Type 2 diabetes mellitus with diabetic polyneuropathy, without long-term current use of insulin [facility])   [facility], Lilia Argue, MD   6 months ago Lower abdominal pain   [facility], Lilia Argue, MD   8 months ago Lower abdominal pain   [facility], Lilia Argue, MD   9 months ago Hypercalcemia   [facility], Lilia Argue, MD      Future Appointments            In 1 month Rutherford Guys, MD [facility] [place], [facility]         . omeprazole (PRILOSEC) 40 MG capsule [Pharmacy Med Name: OMEPRAZOLE 40MG  CER] 90 capsule 0    Sig: TAKE ONE CAPSULE BY MOUTH DAILY     Gastroenterology: Proton Pump Inhibitors Passed - 03/18/2019  4:40 PM      Passed - Valid encounter within last 12 months    Recent Outpatient Visits          1 month ago Allergic reaction, initial encounter   [facility], Ranell Patrick, MD   1 month ago Type 2 diabetes mellitus with diabetic polyneuropathy, without long-term current use of insulin [facility])   [facility], Lilia Argue, MD   6 months  ago Lower abdominal pain   [facility], Lilia Argue, MD   8 months ago Lower abdominal pain   [facility], Lilia Argue, MD   9 months ago Hypercalcemia   [facility], Lilia Argue, MD      Future Appointments            In 1 month Rutherford Guys, MD [facility] at [place], [facility]

## 2019-03-18 NOTE — Telephone Encounter (Signed)
Received an electronic request for levothyroxine and omeprazole. Refilled 6 days ago. Called pharmacy on file and pharmacist stated they did not receive the refills.  Medications sent again electronically. Called pharmacy and refills were received.

## 2019-04-10 ENCOUNTER — Encounter (HOSPITAL_COMMUNITY): Payer: Self-pay | Admitting: Emergency Medicine

## 2019-04-10 ENCOUNTER — Other Ambulatory Visit: Payer: Self-pay

## 2019-04-10 ENCOUNTER — Observation Stay (HOSPITAL_COMMUNITY)
Admission: EM | Admit: 2019-04-10 | Discharge: 2019-04-11 | Disposition: A | Discharge status: Home / Self Care | Payer: Medicare Other | Attending: Family Medicine | Admitting: Family Medicine

## 2019-04-10 DIAGNOSIS — Z888 Allergy status to other drugs, medicaments and biological substances status: Secondary | ICD-10-CM | POA: Diagnosis not present

## 2019-04-10 DIAGNOSIS — E1122 Type 2 diabetes mellitus with diabetic chronic kidney disease: Secondary | ICD-10-CM | POA: Diagnosis not present

## 2019-04-10 DIAGNOSIS — E785 Hyperlipidemia, unspecified: Secondary | ICD-10-CM | POA: Insufficient documentation

## 2019-04-10 DIAGNOSIS — Z905 Acquired absence of kidney: Secondary | ICD-10-CM | POA: Diagnosis not present

## 2019-04-10 DIAGNOSIS — F419 Anxiety disorder, unspecified: Secondary | ICD-10-CM | POA: Insufficient documentation

## 2019-04-10 DIAGNOSIS — E1142 Type 2 diabetes mellitus with diabetic polyneuropathy: Secondary | ICD-10-CM | POA: Diagnosis not present

## 2019-04-10 DIAGNOSIS — Z79890 Hormone replacement therapy (postmenopausal): Secondary | ICD-10-CM | POA: Insufficient documentation

## 2019-04-10 DIAGNOSIS — N183 Chronic kidney disease, stage 3 unspecified: Secondary | ICD-10-CM | POA: Diagnosis present

## 2019-04-10 DIAGNOSIS — Z880 Allergy status to penicillin: Secondary | ICD-10-CM | POA: Diagnosis not present

## 2019-04-10 DIAGNOSIS — K922 Gastrointestinal hemorrhage, unspecified: Principal | ICD-10-CM | POA: Insufficient documentation

## 2019-04-10 DIAGNOSIS — K625 Hemorrhage of anus and rectum: Secondary | ICD-10-CM | POA: Diagnosis not present

## 2019-04-10 DIAGNOSIS — Z85528 Personal history of other malignant neoplasm of kidney: Secondary | ICD-10-CM | POA: Insufficient documentation

## 2019-04-10 DIAGNOSIS — R1032 Left lower quadrant pain: Secondary | ICD-10-CM | POA: Diagnosis not present

## 2019-04-10 DIAGNOSIS — Z8249 Family history of ischemic heart disease and other diseases of the circulatory system: Secondary | ICD-10-CM | POA: Insufficient documentation

## 2019-04-10 DIAGNOSIS — E875 Hyperkalemia: Secondary | ICD-10-CM | POA: Insufficient documentation

## 2019-04-10 DIAGNOSIS — I130 Hypertensive heart and chronic kidney disease with heart failure and stage 1 through stage 4 chronic kidney disease, or unspecified chronic kidney disease: Secondary | ICD-10-CM | POA: Insufficient documentation

## 2019-04-10 DIAGNOSIS — Z9049 Acquired absence of other specified parts of digestive tract: Secondary | ICD-10-CM | POA: Diagnosis not present

## 2019-04-10 DIAGNOSIS — I10 Essential (primary) hypertension: Secondary | ICD-10-CM

## 2019-04-10 DIAGNOSIS — E039 Hypothyroidism, unspecified: Secondary | ICD-10-CM | POA: Diagnosis not present

## 2019-04-10 DIAGNOSIS — Z7984 Long term (current) use of oral hypoglycemic drugs: Secondary | ICD-10-CM | POA: Diagnosis not present

## 2019-04-10 DIAGNOSIS — I5032 Chronic diastolic (congestive) heart failure: Secondary | ICD-10-CM | POA: Diagnosis not present

## 2019-04-10 DIAGNOSIS — Z7951 Long term (current) use of inhaled steroids: Secondary | ICD-10-CM | POA: Diagnosis not present

## 2019-04-10 DIAGNOSIS — F17210 Nicotine dependence, cigarettes, uncomplicated: Secondary | ICD-10-CM | POA: Diagnosis not present

## 2019-04-10 DIAGNOSIS — Z79899 Other long term (current) drug therapy: Secondary | ICD-10-CM | POA: Diagnosis not present

## 2019-04-10 DIAGNOSIS — K219 Gastro-esophageal reflux disease without esophagitis: Secondary | ICD-10-CM | POA: Insufficient documentation

## 2019-04-10 DIAGNOSIS — R58 Hemorrhage, not elsewhere classified: Secondary | ICD-10-CM | POA: Diagnosis not present

## 2019-04-10 DIAGNOSIS — Z885 Allergy status to narcotic agent status: Secondary | ICD-10-CM | POA: Diagnosis not present

## 2019-04-10 DIAGNOSIS — E1165 Type 2 diabetes mellitus with hyperglycemia: Secondary | ICD-10-CM | POA: Diagnosis not present

## 2019-04-10 LAB — COMPREHENSIVE METABOLIC PANEL
ALT: 23 U/L (ref 0–44)
AST: 64 U/L — ABNORMAL HIGH (ref 15–41)
Albumin: 4.1 g/dL (ref 3.5–5.0)
Alkaline Phosphatase: 68 U/L (ref 38–126)
Anion gap: 8 (ref 5–15)
BUN: 14 mg/dL (ref 8–23)
CO2: 26 mmol/L (ref 22–32)
Calcium: 9.1 mg/dL (ref 8.9–10.3)
Chloride: 105 mmol/L (ref 98–111)
Creatinine, Ser: 1.47 mg/dL — ABNORMAL HIGH (ref 0.44–1.00)
GFR calc Af Amer: 39 mL/min — ABNORMAL LOW (ref >60)
GFR calc non Af Amer: 33 mL/min — ABNORMAL LOW (ref >60)
Glucose, Bld: 216 mg/dL — ABNORMAL HIGH (ref 70–99)
Potassium: 5.9 mmol/L — ABNORMAL HIGH (ref 3.5–5.1)
Sodium: 139 mmol/L (ref 135–145)
Total Bilirubin: 1.8 mg/dL — ABNORMAL HIGH (ref 0.3–1.2)
Total Protein: 7.4 g/dL (ref 6.5–8.1)

## 2019-04-10 LAB — CBC WITH DIFFERENTIAL/PLATELET
Abs Immature Granulocytes: 0.03 10*3/uL (ref 0.00–0.07)
Basophils Absolute: 0.1 10*3/uL (ref 0.0–0.1)
Basophils Relative: 1 %
Eosinophils Absolute: 0.2 10*3/uL (ref 0.0–0.5)
Eosinophils Relative: 3 %
HCT: 40.1 % (ref 36.0–46.0)
Hemoglobin: 13.1 g/dL (ref 12.0–15.0)
Immature Granulocytes: 1 %
Lymphocytes Relative: 39 %
Lymphs Abs: 2.2 10*3/uL (ref 0.7–4.0)
MCH: 32.8 pg (ref 26.0–34.0)
MCHC: 32.7 g/dL (ref 30.0–36.0)
MCV: 100.3 fL — ABNORMAL HIGH (ref 80.0–100.0)
Monocytes Absolute: 0.4 10*3/uL (ref 0.1–1.0)
Monocytes Relative: 8 %
Neutro Abs: 2.8 10*3/uL (ref 1.7–7.7)
Neutrophils Relative %: 48 %
Platelets: 213 10*3/uL (ref 150–400)
RBC: 4 MIL/uL (ref 3.87–5.11)
RDW: 13.6 % (ref 11.5–15.5)
WBC: 5.6 10*3/uL (ref 4.0–10.5)
nRBC: 0 % (ref 0.0–0.2)

## 2019-04-10 LAB — GLUCOSE, CAPILLARY
Glucose-Capillary: 69 mg/dL — ABNORMAL LOW (ref 70–99)
Glucose-Capillary: 105 mg/dL — ABNORMAL HIGH (ref 70–99)

## 2019-04-10 LAB — MAGNESIUM: Magnesium: 2.1 mg/dL (ref 1.7–2.4)

## 2019-04-10 LAB — BRAIN NATRIURETIC PEPTIDE: B Natriuretic Peptide: 36 pg/mL (ref 0.0–100.0)

## 2019-04-10 LAB — CALCIUM: Calcium: 9.7 mg/dL (ref 8.9–10.3)

## 2019-04-10 LAB — POC OCCULT BLOOD, ED: Fecal Occult Bld: POSITIVE — AB

## 2019-04-10 LAB — HEMATOCRIT: HCT: 40.6 % (ref 36.0–46.0)

## 2019-04-10 LAB — PHOSPHORUS: Phosphorus: 2.4 mg/dL — ABNORMAL LOW (ref 2.5–4.6)

## 2019-04-10 LAB — CBG MONITORING, ED: Glucose-Capillary: 84 mg/dL (ref 70–99)

## 2019-04-10 LAB — HEMOGLOBIN: Hemoglobin: 13 g/dL (ref 12.0–15.0)

## 2019-04-10 LAB — LIPASE, BLOOD: Lipase: 42 U/L (ref 11–51)

## 2019-04-10 LAB — TYPE AND SCREEN
ABO/RH(D): O POS
Antibody Screen: NEGATIVE

## 2019-04-10 MED ORDER — CLONAZEPAM 0.5 MG PO TABS
0.5000 mg | ORAL_TABLET | Freq: Three times a day (TID) | ORAL | Status: DC | PRN
  Administered 2019-04-10 – 2019-04-11 (×2): 0.5 mg via ORAL
  Filled 2019-04-10 (×2): qty 1

## 2019-04-10 MED ORDER — LEVALBUTEROL HCL 0.63 MG/3ML IN NEBU: 0.6300 mg | INHALATION_SOLUTION | Freq: Four times a day (QID) | RESPIRATORY_TRACT | Status: DC | PRN

## 2019-04-10 MED ORDER — LEVOTHYROXINE SODIUM 88 MCG PO TABS
88.0000 ug | ORAL_TABLET | Freq: Every day | ORAL | Status: DC
  Administered 2019-04-11: 88 ug via ORAL
  Filled 2019-04-10: qty 1

## 2019-04-10 MED ORDER — SODIUM CHLORIDE 0.9% FLUSH
3.0000 mL | Freq: Two times a day (BID) | INTRAVENOUS | Status: DC
  Administered 2019-04-10 – 2019-04-11 (×2): 3 mL via INTRAVENOUS

## 2019-04-10 MED ORDER — MAGNESIUM CITRATE PO SOLN: 1.0000 | Freq: Once | ORAL | Status: DC | PRN

## 2019-04-10 MED ORDER — ALBUTEROL SULFATE (2.5 MG/3ML) 0.083% IN NEBU: 2.5000 mg | INHALATION_SOLUTION | RESPIRATORY_TRACT | Status: DC | PRN

## 2019-04-10 MED ORDER — SORBITOL 70 % SOLN
30.0000 mL | Freq: Every day | Status: DC | PRN
  Filled 2019-04-10: qty 30

## 2019-04-10 MED ORDER — ACETAMINOPHEN 650 MG RE SUPP: 650.0000 mg | Freq: Four times a day (QID) | RECTAL | Status: DC | PRN

## 2019-04-10 MED ORDER — DOCUSATE SODIUM 100 MG PO CAPS
100.0000 mg | ORAL_CAPSULE | Freq: Two times a day (BID) | ORAL | Status: DC
  Administered 2019-04-10: 21:00:00 100 mg via ORAL
  Filled 2019-04-10 (×3): qty 1

## 2019-04-10 MED ORDER — ONDANSETRON HCL 4 MG PO TABS: 4.0000 mg | ORAL_TABLET | Freq: Four times a day (QID) | ORAL | Status: DC | PRN

## 2019-04-10 MED ORDER — FUROSEMIDE 40 MG PO TABS
40.0000 mg | ORAL_TABLET | Freq: Every day | ORAL | Status: DC
  Administered 2019-04-11: 11:00:00 40 mg via ORAL
  Filled 2019-04-10: qty 1

## 2019-04-10 MED ORDER — SODIUM CHLORIDE 0.9 % IV SOLN
INTRAVENOUS | Status: AC
  Administered 2019-04-10 – 2019-04-11 (×2): via INTRAVENOUS

## 2019-04-10 MED ORDER — ACETAMINOPHEN 325 MG PO TABS
650.0000 mg | ORAL_TABLET | Freq: Four times a day (QID) | ORAL | Status: DC | PRN
  Administered 2019-04-10 – 2019-04-11 (×2): 650 mg via ORAL
  Filled 2019-04-10 (×2): qty 2

## 2019-04-10 MED ORDER — AMLODIPINE BESYLATE 5 MG PO TABS
10.0000 mg | ORAL_TABLET | Freq: Every day | ORAL | Status: DC
  Administered 2019-04-11: 11:00:00 10 mg via ORAL
  Filled 2019-04-10: qty 2

## 2019-04-10 MED ORDER — ONDANSETRON HCL 4 MG/2ML IJ SOLN: 4.0000 mg | Freq: Four times a day (QID) | INTRAMUSCULAR | Status: DC | PRN

## 2019-04-10 MED ORDER — INSULIN ASPART 100 UNIT/ML ~~LOC~~ SOLN
0.0000 [IU] | Freq: Three times a day (TID) | SUBCUTANEOUS | Status: DC
  Administered 2019-04-11: 1 [IU] via SUBCUTANEOUS

## 2019-04-10 MED ORDER — PANTOPRAZOLE SODIUM 40 MG IV SOLR
40.0000 mg | Freq: Two times a day (BID) | INTRAVENOUS | Status: DC
  Administered 2019-04-10 – 2019-04-11 (×2): 40 mg via INTRAVENOUS
  Filled 2019-04-10 (×2): qty 40

## 2019-04-10 MED ORDER — METOPROLOL TARTRATE 50 MG PO TABS
100.0000 mg | ORAL_TABLET | Freq: Two times a day (BID) | ORAL | Status: DC
  Administered 2019-04-10 – 2019-04-11 (×2): 100 mg via ORAL
  Filled 2019-04-10 (×2): qty 2

## 2019-04-10 MED ORDER — TRAZODONE HCL 50 MG PO TABS: 50.0000 mg | ORAL_TABLET | Freq: Every evening | ORAL | Status: DC | PRN

## 2019-04-10 NOTE — ED Provider Notes (Signed)
[place] DEPT Provider Note   CSN: [id number] Arrival date & time: 04/10/19  1150    History   Chief Complaint Chief Complaint  Patient presents with  . Rectal Bleeding    HPI Sherri Burns is a 83 y.o. female.     83 yo F with a cc of BRBPR.  Recurrent issue for her.  Off and on for "years". She felt like todays was larger than normal. Bright red.  Denies chest pain, sob, weakness.  Hx of chronic abdominal pain unchanged.    The history is provided by the patient.  Rectal Bleeding  Quality:  Bright red Amount:  Moderate Duration:  1 day Timing:  Rare Chronicity:  Recurrent Similar prior episodes: yes   Relieved by:  Nothing Worsened by:  Nothing Ineffective treatments:  None tried Associated symptoms: abdominal pain (diffuse)   Associated symptoms: no dizziness, no fever and no vomiting     Past Medical History:  Diagnosis Date  . Abnormal EKG 02/07/2016   Inferolateral T wave inversion and ST depression.  . Acute calculous cholecystitis   . Anxiety   . Arthritis   . Chronic diastolic CHF (congestive heart failure) ([place]) 02/21/2018  . Colon polyps 11/2008   tubular adenoma  . Diabetes mellitus   . Esophageal problem    esophageal dilation  . GERD (gastroesophageal reflux disease)    + hpylori  . GI bleeding 01/2018  . Headache   . Hyperlipidemia   . Hypertension   . Hypothyroidism   . Ischemic colitis ([place])   . Renal cancer, right ([place]) 2010   s/p Rt nephrectomy by Dr. Rosana Hoes  . Renal insufficiency     Patient Active Problem List   Diagnosis Date Noted  . GI bleed 04/10/2019  . S/p nephrectomy 06/24/2018  . Chronic diastolic CHF (congestive heart failure) ([place]) 02/21/2018  . Acute lower GI bleeding 02/20/2018  . Hyperlipidemia   . Abnormal EKG 02/07/2016  . Type 2 diabetes mellitus with diabetic polyneuropathy, without long-term current use of insulin ([place]) 09/23/2012  . Essential hypertension   .  Hypothyroidism   . CKD (chronic kidney disease), stage III [facility])     Past Surgical History:  Procedure Laterality Date  . ABDOMINAL HYSTERECTOMY  1978   partial  . CHOLECYSTECTOMY N/A 04/04/2016   Procedure: LAPAROSCOPIC CHOLECYSTECTOMY;  Surgeon: Autumn Messing III, MD;  Location: [facility];  Service: General;  Laterality: N/A;  . COLONOSCOPY    . ESOPHAGEAL DILATION  2016  . HERNIA REPAIR    . LAPAROSCOPIC CHOLECYSTECTOMY  04/04/2016  . NEPHRECTOMY Right 2010   Dr. Rosana Hoes, urology, due to renal cancer     OB History   No obstetric history on file.      Home Medications    Prior to Admission medications   Medication Sig Start Date End Date Taking? Authorizing Provider  acetaminophen (TYLENOL) 500 MG tablet Take 500 mg by mouth every 6 (six) hours as needed for moderate pain.   Yes [provider]  amLODipine (NORVASC) 10 MG tablet Take 1 tablet (10 mg total) by mouth daily. 01/30/19  Yes Rutherford Guys, MD  cholecalciferol (VITAMIN D) 1000 units tablet Take 1,000 Units by mouth daily.   Yes [provider]  clonazePAM (KLONOPIN) 1 MG tablet TAKE 1/2 TABLET BY MOUTH EVERY MORNING AND TAKE ONE TABLET BY MOUTH AT BEDTIME Patient taking differently: Take 0.5 mg by mouth 2 (two) times daily. TAKE 1/2 TABLET BY MOUTH EVERY  MORNING AND TAKE ONE TABLET BY MOUTH AT BEDTIME 01/30/19  Yes Rutherford Guys, MD  Colloidal Oatmeal (ECZEMA MOISTURIZING) 1 % LOTN Apply 1 application topically as needed. Eczema cream   Yes [provider]  fluticasone (FLONASE) 50 MCG/ACT nasal spray Place 1-2 sprays into both nostrils at bedtime. Patient taking differently: Place 1 spray into both nostrils at bedtime.  01/30/19  Yes Rutherford Guys, MD  furosemide (LASIX) 40 MG tablet TAKE ONE TABLET BY MOUTH DAILY Patient taking differently: Take 40 mg by mouth daily.  02/12/19  Yes Rutherford Guys, MD  glipiZIDE (GLUCOTROL) 10 MG tablet TAKE TWO TABLETS BY MOUTH TWICE A DAY 30 MINUTES BEFORE A  MEAL Patient taking differently: Take 20 mg by mouth 2 (two) times daily before a meal.  03/12/19  Yes Rutherford Guys, MD  hydroxypropyl methylcellulose / hypromellose (ISOPTO TEARS / GONIOVISC) 2.5 % ophthalmic solution Place 2 drops into both eyes 3 (three) times daily as needed for dry eyes.   Yes [provider]  levothyroxine (SYNTHROID) 88 MCG tablet TAKE ONE TABLET BY MOUTH DAILY BEFORE BREAKFAST Patient taking differently: Take 88 mcg by mouth daily before breakfast.  03/18/19  Yes Rutherford Guys, MD  metoprolol tartrate (LOPRESSOR) 100 MG tablet Take 1 tablet (100 mg total) by mouth 2 (two) times daily. 08/25/18  Yes Rutherford Guys, MD  omeprazole (PRILOSEC) 40 MG capsule TAKE ONE CAPSULE BY MOUTH DAILY Patient taking differently: Take 40 mg by mouth daily.  03/18/19  Yes Rutherford Guys, MD  polyethylene glycol (MIRALAX / GLYCOLAX) 17 g packet Take 17 g by mouth daily.   Yes [provider]  vitamin E 100 UNIT capsule Take 100 Units by mouth daily.   Yes [provider]    Family History Family History  Problem Relation Age of Onset  . Cancer Mother        colon, kidney cancer  . Cancer Father        throat cancer  . Cancer Sister   . Heart attack Brother     Social History Social History   Tobacco Use  . Smoking status: Current Some Day Smoker    Packs/day: 0.25    Years: 55.00    Pack years: 13.75    Types: Cigarettes  . Smokeless tobacco: Never Used  . Tobacco comment: cut back amount still  trying to quit  Substance Use Topics  . Alcohol use: No    Alcohol/week: 0.0 standard drinks  . Drug use: No     Allergies   Ace inhibitors; Codeine; Metformin and related; Ciprocin-fluocin-procin [fluocinolone acetonide]; Clonidine derivatives; Crestor [rosuvastatin calcium]; Penicillins; Simvastatin; and Tessalon perles   Review of Systems Review of Systems  Constitutional: Negative for chills and fever.  HENT: Negative for congestion  and rhinorrhea.   Eyes: Negative for redness and visual disturbance.  Respiratory: Negative for shortness of breath and wheezing.   Cardiovascular: Negative for chest pain and palpitations.  Gastrointestinal: Positive for abdominal pain (diffuse), blood in stool and hematochezia. Negative for nausea and vomiting.  Genitourinary: Negative for dysuria and urgency.  Musculoskeletal: Negative for arthralgias and myalgias.  Skin: Negative for pallor and wound.  Neurological: Negative for dizziness and headaches.     Physical Exam Updated Vital Signs BP 121/64 (BP Location: Left Arm)   Pulse 64   Temp 98.1 F (36.7 C) (Oral)   Resp 18   Ht 5\' 6"  (1.676 m)   Wt 76.9 kg  SpO2 98%   BMI 27.35 kg/m   Physical Exam Vitals signs and nursing note reviewed.  Constitutional:      General: She is not in acute distress.    Appearance: She is well-developed. She is not diaphoretic.  HENT:     Head: Normocephalic and atraumatic.  Eyes:     Pupils: Pupils are equal, round, and reactive to light.  Neck:     Musculoskeletal: Normal range of motion and neck supple.  Cardiovascular:     Rate and Rhythm: Normal rate and regular rhythm.     Heart sounds: No murmur. No friction rub. No gallop.   Pulmonary:     Effort: Pulmonary effort is normal.     Breath sounds: No wheezing or rales.  Abdominal:     General: There is no distension.     Palpations: Abdomen is soft.     Tenderness: There is abdominal tenderness (diffuse).  Genitourinary:    Comments: Soft brown stool in the vault, hemorrhoids externally and internally without bleeding Musculoskeletal:        General: No tenderness.  Skin:    General: Skin is warm and dry.  Neurological:     Mental Status: She is alert and oriented to person, place, and time.  Psychiatric:        Behavior: Behavior normal.      ED Treatments / Results  Labs (all labs ordered are listed, but only abnormal results are displayed) Labs Reviewed  CBC  WITH DIFFERENTIAL/PLATELET - Abnormal; Notable for the following components:      Result Value   MCV 100.3 (*)    All other components within normal limits  COMPREHENSIVE METABOLIC PANEL - Abnormal; Notable for the following components:   Potassium 5.9 (*)    Glucose, Bld 216 (*)    Creatinine, Ser 1.47 (*)    AST 64 (*)    Total Bilirubin 1.8 (*)    GFR calc non Af Amer 33 (*)    GFR calc Af Amer 39 (*)    All other components within normal limits  PHOSPHORUS - Abnormal; Notable for the following components:   Phosphorus 2.4 (*)    All other components within normal limits  BASIC METABOLIC PANEL - Abnormal; Notable for the following components:   Creatinine, Ser 1.13 (*)    GFR calc non Af Amer 46 (*)    GFR calc Af Amer 53 (*)    All other components within normal limits  CBC - Abnormal; Notable for the following components:   RBC 3.79 (*)    Hemoglobin 11.7 (*)    All other components within normal limits  GLUCOSE, CAPILLARY - Abnormal; Notable for the following components:   Glucose-Capillary 69 (*)    All other components within normal limits  GLUCOSE, CAPILLARY - Abnormal; Notable for the following components:   Glucose-Capillary 105 (*)    All other components within normal limits  GLUCOSE, CAPILLARY - Abnormal; Notable for the following components:   Glucose-Capillary 119 (*)    All other components within normal limits  GLUCOSE, CAPILLARY - Abnormal; Notable for the following components:   Glucose-Capillary 125 (*)    All other components within normal limits  POC OCCULT BLOOD, ED - Abnormal; Notable for the following components:   Fecal Occult Bld POSITIVE (*)    All other components within normal limits  LIPASE, BLOOD  BRAIN NATRIURETIC PEPTIDE  HEMATOCRIT  HEMOGLOBIN  MAGNESIUM  CALCIUM  PROTIME-INR  APTT  HEMOGLOBIN AND  HEMATOCRIT, BLOOD  CBG MONITORING, ED  TYPE AND SCREEN  ABO/RH    EKG EKG Interpretation  Date/Time:  Friday Apr 10 2019 14:00:18  EDT Ventricular Rate:  66 PR Interval:  194 QRS Duration: 100 QT Interval:  382 QTC Calculation: 400 R Axis:   -8 Text Interpretation:  Normal sinus rhythm Nonspecific T wave abnormality Abnormal ECG When compared with ECG of EARLIER SAME DATE Baseline wander is no longer present Confirmed by Delora Fuel ([id number]) on 04/11/2019 11:49:47 AM   Radiology Dg Abd 2 Views  Result Date: 04/11/2019 CLINICAL DATA:  Left upper quadrant pain EXAM: ABDOMEN - 2 VIEW COMPARISON:  CT abdomen 02/15/2018 FINDINGS: Normal bowel gas pattern.  No obstruction or free air. Surgical clips in the right abdomen from nephrectomy. No abnormal calcifications. No acute skeletal abnormality. IMPRESSION: Negative. Electronically Signed   By: Franchot Gallo M.D.   On: 04/11/2019 16:30    Procedures Procedures (including critical care time)  Medications Ordered in ED Medications  0.9 %  sodium chloride infusion ( Intravenous Rate/Dose Verify 04/11/19 1748)     Initial Impression / Assessment and Plan / ED Course  I have reviewed the triage vital signs and the nursing notes.  Pertinent labs & imaging results that were available during my care of the patient were reviewed by me and considered in my medical decision making (see chart for details).        83 yo F with hx of recurrent lower GI bleeds here with the same. One episode of bright red blood.  Exam without bleeding.  Will obtain labwork, obs in the ED.   The patient's hemoglobin is at her baseline.  Her fecal occult test was positive.  My rectal exam without obvious bleeding soft brown stool.  I discussed the case with Dr. Watt Climes, [facility] gastroenterology he felt it would be reasonable to observe the patient overnight have her on a clear liquid diet and have the hospitalist contact the team if they felt that her symptoms worsened or hemoglobin drop.  The patients results and plan were reviewed and discussed.   Any x-rays performed were independently reviewed by  myself.   Differential diagnosis were considered with the presenting HPI.  Medications  0.9 %  sodium chloride infusion ( Intravenous Rate/Dose Verify 04/11/19 1748)    Vitals:   04/11/19 0500 04/11/19 0619 04/11/19 0828 04/11/19 1412  BP:  (!) 144/72  121/64  Pulse:  63  64  Resp:  16  18  Temp:  98 F (36.7 C)  98.1 F (36.7 C)  TempSrc:  Oral  Oral  SpO2:  99% 99% 98%  Weight: 76.9 kg     Height:        Final diagnoses:  LLQ abdominal pain    Admission/ observation were discussed with the admitting physician, patient and/or family and they are comfortable with the plan.    Final Clinical Impressions(s) / ED Diagnoses   Final diagnoses:  LLQ abdominal pain    ED Discharge Orders         Ordered    Increase activity slowly     04/11/19 1645    Call MD for:  temperature >100.4     04/11/19 1645    Call MD for:  persistant nausea and vomiting     04/11/19 1645    Call MD for:  extreme fatigue     04/11/19 [place], Daveda Larock, DO 04/13/19  1124  

## 2019-04-10 NOTE — ED Triage Notes (Signed)
Patient arrived by EMS from home. Pt c/o rectal bleeding.   Hx of bowel problems (constipation, hemroids). Pt has to take laxative at home per EMS.  EMS VS BP146/96, HR 100, RR 20, CBG 263, T 97.9 F.

## 2019-04-10 NOTE — ED Notes (Signed)
ED TO INPATIENT HANDOFF REPORT  ED Nurse Name and Phone #: Malon Kindle Name/Age/Gender Sherri Burns 83 y.o. female Room/Bed: WA06/WA06  Code Status   Code Status: Full Code  Home/SNF/Other Home Patient oriented to: self, place, time and situation Is this baseline? Yes   Triage Complete: Triage complete  Chief Complaint rectal bleed  Triage Note Patient arrived by EMS from home. Pt c/o rectal bleeding.   Hx of bowel problems (constipation, hemroids). Pt has to take laxative at home per EMS.  EMS VS BP146/96, HR 100, RR 20, CBG 263, T 97.9 F.     Allergies Allergies  Allergen Reactions  . Ace Inhibitors Swelling    See 02/16/19   . Codeine Other (See Comments)    hallucinations  . Metformin And Related     Upset stomach  . Ciprocin-Fluocin-Procin [Fluocinolone Acetonide] Other (See Comments)    Unknown reaction  . Clonidine Derivatives Other (See Comments)    Dry mouth  . Crestor [Rosuvastatin Calcium] Other (See Comments)    cramps  . Penicillins Other (See Comments)    Has patient had a PCN reaction causing immediate rash, facial/tongue/throat swelling, SOB or lightheadedness with hypotension: no Has patient had a PCN reaction causing severe rash involving mucus membranes or skin necrosis: no Has patient had a PCN reaction that required hospitalization : no Has patient had a PCN reaction occurring within the last 10 years: no -Hallucinates If all of the above answers are "NO", then may proceed with Cephalosporin use.   . Simvastatin Other (See Comments)    Upset stomach   . Tessalon Perles Other (See Comments)    Gi upset     Level of Care/Admitting Diagnosis ED Disposition    ED Disposition Condition Comment   Admit  Hospital Area: [facility] [[id number]]  Level of Care: Med-Surg [16]  Covid Evaluation: N/A  Diagnosis: GI bleed [403474]  Admitting Physician: Acquanetta Sit  Attending Physician: Deatra James  [phone]  PT Class (Do Not Modify): Observation [104]  PT Acc Code (Do Not Modify): Observation [10022]       B Medical/Surgery History Past Medical History:  Diagnosis Date  . Abnormal EKG 02/07/2016   Inferolateral T wave inversion and ST depression.  . Acute calculous cholecystitis   . Anxiety   . Arthritis   . Chronic diastolic CHF (congestive heart failure) ([place]) 02/21/2018  . Colon polyps 11/2008   tubular adenoma  . Diabetes mellitus   . Esophageal problem    esophageal dilation  . GERD (gastroesophageal reflux disease)    + hpylori  . GI bleeding 01/2018  . Headache   . Hyperlipidemia   . Hypertension   . Hypothyroidism   . Ischemic colitis ([place])   . Renal cancer, right ([place]) 2010   s/p Rt nephrectomy by Dr. Rosana Hoes  . Renal insufficiency    Past Surgical History:  Procedure Laterality Date  . ABDOMINAL HYSTERECTOMY  1978   partial  . CHOLECYSTECTOMY N/A 04/04/2016   Procedure: LAPAROSCOPIC CHOLECYSTECTOMY;  Surgeon: Autumn Messing III, MD;  Location: [facility];  Service: General;  Laterality: N/A;  . COLONOSCOPY    . ESOPHAGEAL DILATION  2016  . HERNIA REPAIR    . LAPAROSCOPIC CHOLECYSTECTOMY  04/04/2016  . NEPHRECTOMY Right 2010   Dr. Rosana Hoes, urology, due to renal cancer     A IV Location/Drains/Wounds Patient Lines/Drains/Airways Status   Active Line/Drains/Airways    Name:   Placement date:  Placement time:   Site:   Days:   Peripheral IV 04/10/19 Right Antecubital   04/10/19    1524    Antecubital   less than 1          Intake/Output Last 24 hours No intake or output data in the 24 hours ending 04/10/19 1607  Labs/Imaging Results for orders placed or performed during the hospital encounter of 04/10/19 (from the past 48 hour(s))  CBC with Differential     Status: Abnormal   Collection Time: 04/10/19 12:06 PM  Result Value Ref Range   WBC 5.6 4.0 - 10.5 K/uL   RBC 4.00 3.87 - 5.11 MIL/uL   Hemoglobin 13.1 12.0 - 15.0 g/dL   HCT 40.1 36.0 - 46.0 %    MCV 100.3 (H) 80.0 - 100.0 fL   MCH 32.8 26.0 - 34.0 pg   MCHC 32.7 30.0 - 36.0 g/dL   RDW 13.6 11.5 - 15.5 %   Platelets 213 150 - 400 K/uL   nRBC 0.0 0.0 - 0.2 %   Neutrophils Relative % 48 %   Neutro Abs 2.8 1.7 - 7.7 K/uL   Lymphocytes Relative 39 %   Lymphs Abs 2.2 0.7 - 4.0 K/uL   Monocytes Relative 8 %   Monocytes Absolute 0.4 0.1 - 1.0 K/uL   Eosinophils Relative 3 %   Eosinophils Absolute 0.2 0.0 - 0.5 K/uL   Basophils Relative 1 %   Basophils Absolute 0.1 0.0 - 0.1 K/uL   Immature Granulocytes 1 %   Abs Immature Granulocytes 0.03 0.00 - 0.07 K/uL    Comment: Performed at [facility], [place] [address] [place], [place] [address]  Comprehensive metabolic panel     Status: Abnormal   Collection Time: 04/10/19 12:06 PM  Result Value Ref Range   Sodium 139 135 - 145 mmol/L   Potassium 5.9 (H) 3.5 - 5.1 mmol/L   Chloride 105 98 - 111 mmol/L   CO2 26 22 - 32 mmol/L   Glucose, Bld 216 (H) 70 - 99 mg/dL   BUN 14 8 - 23 mg/dL   Creatinine, Ser 1.47 (H) 0.44 - 1.00 mg/dL   Calcium 9.1 8.9 - 10.3 mg/dL   Total Protein 7.4 6.5 - 8.1 g/dL   Albumin 4.1 3.5 - 5.0 g/dL   AST 64 (H) 15 - 41 U/L   ALT 23 0 - 44 U/L   Alkaline Phosphatase 68 38 - 126 U/L   Total Bilirubin 1.8 (H) 0.3 - 1.2 mg/dL   GFR calc non Af Amer 33 (L) >60 mL/min   GFR calc Af Amer 39 (L) >60 mL/min   Anion gap 8 5 - 15    Comment: Performed at [facility], [place] [address], [place], [place] [address]  Lipase, blood     Status: None   Collection Time: 04/10/19 12:06 PM  Result Value Ref Range   Lipase 42 11 - 51 U/L    Comment: Performed at [facility], [place] [address] [place], [place] [address]  POC occult blood, ED Provider will collect     Status: Abnormal   Collection Time: 04/10/19 12:21 PM  Result Value Ref Range   Fecal Occult Bld POSITIVE (A) NEGATIVE  Type and screen     Status: None   Collection Time: 04/10/19 12:46 PM  Result Value Ref  Range   ABO/RH(D) O POS    Antibody Screen NEG    Sample Expiration  04/13/2019,2359 Performed at [facility], [place] [address], [place], [place] [address]   ABO/Rh     Status: None (Preliminary result)   Collection Time: 04/10/19 12:46 PM  Result Value Ref Range   ABO/RH(D)      O POS Performed at [facility], [place] [address] [place],  [address]   CBG monitoring, ED     Status: None   Collection Time: 04/10/19  3:41 PM  Result Value Ref Range   Glucose-Capillary 84 70 - 99 mg/dL   No results found.  Pending Labs Unresulted Labs (From admission, onward)    Start     Ordered   04/11/19 4765  Basic metabolic panel  Daily,   R     04/10/19 1506   04/11/19 0500  CBC  Daily,   R     04/10/19 1506   04/11/19 0500  Protime-INR  Tomorrow morning,   R     04/10/19 1506   04/11/19 0500  APTT  Tomorrow morning,   R     04/10/19 1506   04/10/19 1510  Calcium  Add-on,   R     04/10/19 1509   04/10/19 1509  Hematocrit  Add-on,   R     04/10/19 1509   04/10/19 1509  Hemoglobin  Add-on,   R     04/10/19 1509   04/10/19 1509  Magnesium  Add-on,   R     04/10/19 1509   04/10/19 1509  Phosphorus  Add-on,   R     04/10/19 1509   04/10/19 1502  Brain natriuretic peptide  Once,   R     04/10/19 1506          Vitals/Pain Today's Vitals   04/10/19 1200 04/10/19 1203 04/10/19 1204 04/10/19 1458  BP: (!) 185/101   (!) 133/95  Pulse: 96   73  Resp: 15   13  Temp: 98.1 F (36.7 C)     TempSrc: Oral     SpO2: 99%   100%  Weight:   76.7 kg   Height:   5\' 6"  (1.676 m)   PainSc:  7       Isolation Precautions No active isolations  Medications Medications  sodium chloride flush (NS) 0.9 % injection 3 mL (has no administration in time range)  0.9 %  sodium chloride infusion (has no administration in time range)  acetaminophen (TYLENOL) tablet 650 mg (has no administration in time range)    Or  acetaminophen (TYLENOL) suppository  650 mg (has no administration in time range)  traZODone (DESYREL) tablet 50 mg (has no administration in time range)  docusate sodium (COLACE) capsule 100 mg (has no administration in time range)  sorbitol 70 % solution 30 mL (has no administration in time range)  magnesium citrate solution 1 Bottle (has no administration in time range)  ondansetron (ZOFRAN) tablet 4 mg (has no administration in time range)    Or  ondansetron (ZOFRAN) injection 4 mg (has no administration in time range)  albuterol (PROVENTIL) (2.5 MG/3ML) 0.083% nebulizer solution 2.5 mg (has no administration in time range)  levalbuterol (XOPENEX) nebulizer solution 0.63 mg (has no administration in time range)  pantoprazole (PROTONIX) injection 40 mg (has no administration in time range)  amLODipine (NORVASC) tablet 10 mg (has no administration in time range)  furosemide (LASIX) tablet 40 mg (has no administration in time range)  metoprolol tartrate (LOPRESSOR) tablet 100 mg (has no administration in time  range)  levothyroxine (SYNTHROID) tablet 88 mcg (has no administration in time range)  clonazePAM (KLONOPIN) tablet 0.5 mg (has no administration in time range)  insulin aspart (novoLOG) injection 0-9 Units (has no administration in time range)    Mobility walks with person assist Low fall risk   Focused Assessments Cardiac Assessment Handoff:    Lab Results  Component Value Date   CKTOTAL 68 12/07/2017   TROPONINI <0.03 01/03/2017   Lab Results  Component Value Date   DDIMER 0.95 (H) 01/03/2017   Does the Patient currently have chest pain? Yes     R Recommendations: See Admitting Provider Note  Report given to:   Additional Notes:

## 2019-04-10 NOTE — H&P (Signed)
History and Physical   [name]                            PCP: Rutherford Guys, MD                    DOB: Jun 13, 1936            DOA: 04/10/2019 ALP:[id number]             DOS: 04/10/2019, 3:11 PM  Patient coming from:   Home I have personally reviewed patient's medical records, in electronic medical records, including: [facility], and care everywhere.    Chief Complaint:   Chief Complaint  Patient presents with  . Rectal Bleeding    History of present illness:    Sherri Burns is a 83 y.o. female with medical history significant of HTN/ HLD/ HPT/ CKD/GERD/CHF/DM II/ anxiety /with history of renal cancer right nephrectomy by Dr. Rosana Hoes  presented today with rectal bleed. Patient reported of no history of rectal bleed but today has been more than usual bright red blood per rectal area.  One episode, started today associated with mild abdominal discomfort but denies having any diarrhea or constipation.  Patient currently stable, denies of having any recent illnesses, denies of having any fever, chills, nausea or vomiting.  Denies of having any upper respiratory symptoms.  Denies any chest pain or shortness of breath.  Diffuse mild abdominal discomfort/pain.  Denies any dysuria.  Denies any joint pain open wounds or rash.    ED Course:   Patient was evaluated: Vitals were stable, hemoglobin 13.1 Rectal exam per ED [facility] reported hemorrhoids, with mild bleeding, Hemoccult positive He has called Dr. Watt Climes was recommended for patient to be admitted for observation. Call him if the bleeding continues and H&H drops.  Review of Systems: As per HPI otherwise 12 point review of systems negative.    Assessment / Plan:   Principal Problem: Acute lower GI bleed -Patient will be admitted to general medical floor for close observation -May continue very gentle IV fluid hydration -Trend H&H -GI recommended clear liquid diet -GI bleed looks like it is a lower GI  bleed, likely hemorrhoid nevertheless started on Protonix -Holding all antiplatelets meds  -If continues to have rectal bleed in a.m., with drop in H&H call GI Dr. Watt Climes  Hyperkalemia -Potassium at 5.9 monitoring closely, repeating labs -Gentle IV fluid hydration -If symptomatic,and if  needed will utilize Kayexalate versus insulin versus calcium gluconate  Active Problems:   Essential hypertension -stable continue home medication   Hypothyroidism -continue Synthroid   CKD (chronic kidney disease), stage III (HCC) -creatinine at baseline monitoring   Type 2 DM with polyneuropathy, w/o long-term current use of insulin ([place]) -Will be on clear liquid diet, holding home medication of glipizide, SSI, checking CBG QA CHS   Hyperlipidemia -continue statins   Chronic diastolic CHF (congestive heart failure) -monitor closely, daily weight, currently needs gentle IV fluid hydration, will try to avoid volume overload, may restart Lasix in the morning History of renal cancer  -  renal cancer right nephrectomy by Dr. Rosana Hoes     DVT prophylaxis: SCD/Compression stockings  Code Status:   Code Status: Full Code  Family Communication:  The above findings and plan of care has been discussed with patient and family in detail, they expressed understanding and agreement of above plan.   Disposition Plan:  Anticipated 1-2 days  Consults called:  None  Dr.Magod was called in ED. He needs to be consulted if needed.  Admission status: Patient will be admitted as Observation, with a less than 2 midnight length of stay.  ----------------------------------------------------------------------------------------------------------------------  Allergies  Allergen Reactions  . Ace Inhibitors Swelling    See 02/16/19   . Codeine Other (See Comments)    hallucinations  . Metformin And Related     Upset stomach  . Ciprocin-Fluocin-Procin [Fluocinolone Acetonide] Other (See Comments)    Unknown reaction  .  Clonidine Derivatives Other (See Comments)    Dry mouth  . Crestor [Rosuvastatin Calcium] Other (See Comments)    cramps  . Penicillins Other (See Comments)    Has patient had a PCN reaction causing immediate rash, facial/tongue/throat swelling, SOB or lightheadedness with hypotension: no Has patient had a PCN reaction causing severe rash involving mucus membranes or skin necrosis: no Has patient had a PCN reaction that required hospitalization : no Has patient had a PCN reaction occurring within the last 10 years: no -Hallucinates If all of the above answers are "NO", then may proceed with Cephalosporin use.   . Simvastatin Other (See Comments)    Upset stomach   . Tessalon Perles Other (See Comments)    Gi upset     Home MEDs:  Prior to Admission medications   Medication Sig Start Date End Date Taking? Authorizing Provider  amLODipine (NORVASC) 10 MG tablet Take 1 tablet (10 mg total) by mouth daily. 01/30/19   Rutherford Guys, MD  cholecalciferol (VITAMIN D) 1000 units tablet Take 1,000 Units by mouth daily.    [provider]  clonazePAM (KLONOPIN) 1 MG tablet TAKE 1/2 TABLET BY MOUTH EVERY MORNING AND TAKE ONE TABLET BY MOUTH AT BEDTIME 01/30/19   Rutherford Guys, MD  Colloidal Oatmeal (ECZEMA MOISTURIZING) 1 % LOTN Apply 1 application topically as needed. Eczema cream    [provider]  diclofenac sodium (VOLTAREN) 1 % GEL APPLY TWO GRAMS TOPICALLY 4 (FOUR) TIMES DAILY. Patient not taking: Reported on 02/16/2019 08/25/18   Rutherford Guys, MD  fluticasone [facility]) 50 MCG/ACT nasal spray Place 1-2 sprays into both nostrils at bedtime. 01/30/19   Rutherford Guys, MD  furosemide (LASIX) 40 MG tablet TAKE ONE TABLET BY MOUTH DAILY 02/12/19   Rutherford Guys, MD  glipiZIDE (GLUCOTROL) 10 MG tablet TAKE TWO TABLETS BY MOUTH TWICE A DAY 30 MINUTES BEFORE A MEAL 03/12/19   Rutherford Guys, MD  levothyroxine (SYNTHROID) 88 MCG tablet TAKE ONE TABLET BY MOUTH DAILY BEFORE  BREAKFAST 03/18/19   Rutherford Guys, MD  metoprolol tartrate (LOPRESSOR) 100 MG tablet Take 1 tablet (100 mg total) by mouth 2 (two) times daily. 08/25/18   Rutherford Guys, MD  omeprazole (PRILOSEC) 40 MG capsule TAKE ONE CAPSULE BY MOUTH DAILY 03/18/19   Rutherford Guys, MD  polyethylene glycol powder (GLYCOLAX/MIRALAX) 17 GM/SCOOP powder TAKE 17 G BY MOUTH EVERY MORNING. 03/12/19   Rutherford Guys, MD  vitamin E 100 UNIT capsule Take 100 Units by mouth daily.    [provider]    PRN MEDs: acetaminophen **OR** acetaminophen, albuterol, clonazePAM, levalbuterol, magnesium citrate, ondansetron **OR** ondansetron (ZOFRAN) IV, sorbitol, traZODone  Past Medical History:  Diagnosis Date  . Abnormal EKG 02/07/2016   Inferolateral T wave inversion and ST depression.  . Acute calculous cholecystitis   . Anxiety   . Arthritis   . Chronic diastolic CHF (congestive heart failure) ([place]) 02/21/2018  .  Colon polyps 11/2008   tubular adenoma  . Diabetes mellitus   . Esophageal problem    esophageal dilation  . GERD (gastroesophageal reflux disease)    + hpylori  . GI bleeding 01/2018  . Headache   . Hyperlipidemia   . Hypertension   . Hypothyroidism   . Ischemic colitis ([place])   . Renal cancer, right ([place]) 2010   s/p Rt nephrectomy by Dr. Rosana Hoes  . Renal insufficiency     Past Surgical History:  Procedure Laterality Date  . ABDOMINAL HYSTERECTOMY  1978   partial  . CHOLECYSTECTOMY N/A 04/04/2016   Procedure: LAPAROSCOPIC CHOLECYSTECTOMY;  Surgeon: Autumn Messing III, MD;  Location: [facility];  Service: General;  Laterality: N/A;  . COLONOSCOPY    . ESOPHAGEAL DILATION  2016  . HERNIA REPAIR    . LAPAROSCOPIC CHOLECYSTECTOMY  04/04/2016  . NEPHRECTOMY Right 2010   Dr. Rosana Hoes, urology, due to renal cancer     reports that she has been smoking cigarettes. She has a 13.75 pack-year smoking history. She has never used smokeless tobacco. She reports that she does not drink alcohol or use  drugs.   Family History  Problem Relation Age of Onset  . Cancer Mother        colon, kidney cancer  . Cancer Father        throat cancer  . Cancer Sister   . Heart attack Brother     Physical Exam:    Constitutional: NAD, calm, comfortable Vitals:   04/10/19 1200 04/10/19 1204 04/10/19 1458  BP: (!) 185/101  (!) 133/95  Pulse: 96  73  Resp: 15  13  Temp: 98.1 F (36.7 C)    TempSrc: Oral    SpO2: 99%  100%  Weight:  76.7 kg   Height:  5\' 6"  (1.676 m)    Eyes: PERRL, lids and conjunctivae normal ENMT: Mucous membranes are moist. Posterior pharynx clear of any exudate or lesions.Normal dentition.  Neck: normal, supple, no masses, no thyromegaly Respiratory: clear to auscultation bilaterally, no wheezing, no crackles. Normal respiratory effort. No accessory muscle use.  Cardiovascular: Regular rate and rhythm, no murmurs / rubs / gallops. No extremity edema. 2+ pedal pulses. No carotid bruits.  Abdomen: no tenderness, no masses palpated. No hepatosplenomegaly. Bowel sounds positive.  Musculoskeletal: no clubbing / cyanosis. No joint deformity upper and lower extremities. Good ROM, no contractures. Normal muscle tone.  Neurologic: CN II-XII grossly intact. Sensation intact, DTR normal. Strength 5/5 in all 4.  Psychiatric: Normal judgment and insight. Alert and oriented x 3. Normal mood.  Skin: no rashes, lesions, ulcers. No induration Decubitus/ulcers: None visible Urinary catheter: Chronic indwelling/was placed in this admission  Labs on admission:    I have personally reviewed following labs and imaging studies  CBC: Recent Labs  Lab 04/10/19 1206  WBC 5.6  NEUTROABS 2.8  HGB 13.1  HCT 40.1  MCV 100.3*  PLT 017   Basic Metabolic Panel: Recent Labs  Lab 04/10/19 1206  NA 139  K 5.9*  CL 105  CO2 26  GLUCOSE 216*  BUN 14  CREATININE 1.47*  CALCIUM 9.1   GFR: Estimated Creatinine Clearance: 31.9 mL/min (A) (by C-G formula based on SCr of 1.47 mg/dL  (H)). Liver Function Tests: Recent Labs  Lab 04/10/19 1206  AST 64*  ALT 23  ALKPHOS 68  BILITOT 1.8*  PROT 7.4  ALBUMIN 4.1   Recent Labs  Lab 04/10/19 1206  LIPASE 42   Urine analysis:  Component Value Date/Time   COLORURINE YELLOW 01/19/2018 0835   APPEARANCEUR HAZY (A) 01/19/2018 0835   LABSPEC 1.020 01/19/2018 0835   PHURINE 5.0 01/19/2018 0835   GLUCOSEU NEGATIVE 01/19/2018 0835   HGBUR NEGATIVE 01/19/2018 0835   BILIRUBINUR negative 08/25/2018 1001   BILIRUBINUR small 11/09/2014 1629   KETONESUR negative 08/25/2018 1001   KETONESUR NEGATIVE 01/19/2018 0835   PROTEINUR trace (A) 08/25/2018 1001   PROTEINUR NEGATIVE 01/19/2018 0835   UROBILINOGEN 0.2 08/25/2018 1001   UROBILINOGEN 0.2 04/28/2013 2321   NITRITE Negative 08/25/2018 1001   NITRITE NEGATIVE 01/19/2018 0835   LEUKOCYTESUR Small (1+) (A) 08/25/2018 1001     Radiologic Exams on Admission:   No results found.  EKG:   Independently reviewed.   Orders placed or performed during the hospital encounter of 04/10/19  . EKG 12-Lead  . EKG 12-Lead  . EKG 12-Lead     Time spent: > than  50 Min.   Deatra James MD [facility] ,  Pager [phone]  If 7PM-7AM, please contact night-coverage [email or web address]  Password [email or web address] 04/10/2019, 3:11 PM

## 2019-04-10 NOTE — ED Notes (Signed)
Bed: WA06 Expected date:  Expected time:  Means of arrival:  Comments: EMS-rectal bleed

## 2019-04-10 NOTE — ED Notes (Signed)
This writer attempted blood draw x 2 unsuccesful. This Probation officer has asked NT to assist with a blood draw attempt.

## 2019-04-10 NOTE — ED Notes (Addendum)
Patient ambulated to the bathroom w/ assistance from ED staff.

## 2019-04-11 ENCOUNTER — Observation Stay (HOSPITAL_COMMUNITY): Payer: Medicare Other

## 2019-04-11 DIAGNOSIS — N183 Chronic kidney disease, stage 3 (moderate): Secondary | ICD-10-CM | POA: Diagnosis not present

## 2019-04-11 DIAGNOSIS — R1012 Left upper quadrant pain: Secondary | ICD-10-CM | POA: Diagnosis not present

## 2019-04-11 DIAGNOSIS — E785 Hyperlipidemia, unspecified: Secondary | ICD-10-CM | POA: Diagnosis not present

## 2019-04-11 DIAGNOSIS — K922 Gastrointestinal hemorrhage, unspecified: Secondary | ICD-10-CM | POA: Diagnosis not present

## 2019-04-11 DIAGNOSIS — I10 Essential (primary) hypertension: Secondary | ICD-10-CM | POA: Diagnosis not present

## 2019-04-11 DIAGNOSIS — I5032 Chronic diastolic (congestive) heart failure: Secondary | ICD-10-CM | POA: Diagnosis not present

## 2019-04-11 LAB — CBC
HCT: 37.8 % (ref 36.0–46.0)
Hemoglobin: 11.7 g/dL — ABNORMAL LOW (ref 12.0–15.0)
MCH: 30.9 pg (ref 26.0–34.0)
MCHC: 31 g/dL (ref 30.0–36.0)
MCV: 99.7 fL (ref 80.0–100.0)
Platelets: 191 10*3/uL (ref 150–400)
RBC: 3.79 MIL/uL — ABNORMAL LOW (ref 3.87–5.11)
RDW: 13.8 % (ref 11.5–15.5)
WBC: 6.4 10*3/uL (ref 4.0–10.5)
nRBC: 0 % (ref 0.0–0.2)

## 2019-04-11 LAB — BASIC METABOLIC PANEL
Anion gap: 9 (ref 5–15)
BUN: 8 mg/dL (ref 8–23)
CO2: 24 mmol/L (ref 22–32)
Calcium: 9 mg/dL (ref 8.9–10.3)
Chloride: 109 mmol/L (ref 98–111)
Creatinine, Ser: 1.13 mg/dL — ABNORMAL HIGH (ref 0.44–1.00)
GFR calc Af Amer: 53 mL/min — ABNORMAL LOW (ref >60)
GFR calc non Af Amer: 46 mL/min — ABNORMAL LOW (ref >60)
Glucose, Bld: 96 mg/dL (ref 70–99)
Potassium: 4 mmol/L (ref 3.5–5.1)
Sodium: 142 mmol/L (ref 135–145)

## 2019-04-11 LAB — HEMOGLOBIN AND HEMATOCRIT, BLOOD
HCT: 38.6 % (ref 36.0–46.0)
Hemoglobin: 12.4 g/dL (ref 12.0–15.0)

## 2019-04-11 LAB — APTT: aPTT: 27 seconds (ref 24–36)

## 2019-04-11 LAB — PROTIME-INR
INR: 1 (ref 0.8–1.2)
Prothrombin Time: 13 seconds (ref 11.4–15.2)

## 2019-04-11 LAB — ABO/RH: ABO/RH(D): O POS

## 2019-04-11 LAB — GLUCOSE, CAPILLARY
Glucose-Capillary: 119 mg/dL — ABNORMAL HIGH (ref 70–99)
Glucose-Capillary: 125 mg/dL — ABNORMAL HIGH (ref 70–99)

## 2019-04-11 NOTE — Discharge Instructions (Signed)
Sherri Burns,  You were in the hospital because of blood per rectum. This is likely secondary to your diverticulosis. Your bleeding has resolved and your blood counts are stable. Please follow-up with Dr. Watt Climes as an outpatient. With regard to your abdominal pain that you mentioned, you x-ray did not show any abnormalities. Please follow-up with your primary care physician as they may need to work this up further. You do have diverticulosis, so if you develop any fevers or ill-feeling (malaise), please let your primary care physician know. It is unlikely, though, that you would have had diverticulitis (inflammation of diverticulosis) for many months without being sick.

## 2019-04-11 NOTE — Discharge Summary (Signed)
Physician Discharge Summary  Sherri Burns TWS:[id number] DOB: [birth year] DOA: 04/10/2019  PCP: Rutherford Guys, MD  Admit date: 04/10/2019 Discharge date: 04/11/2019  Admitted From: Home Disposition: Home  Recommendations for Outpatient Follow-up:  1. Follow up with PCP in 1 week 2. Follow up with [facility] as needed 3. Please obtain BMP/CBC in one week 4. Please follow up on the following pending results: None  Home Health: None Equipment/Devices: None  Discharge Condition: Stable CODE STATUS: Full code Diet recommendation: Heart healthy   Brief/Interim Summary:  Admission HPI written by Deatra James, MD   History of present illness:   Sherri Burns is a 83 y.o. female with medical history significant of HTN/ HLD/ HPT/ CKD/GERD/CHF/DM II/ anxiety /with history of renal cancer right nephrectomy by Dr. Rosana Hoes  presented today with rectal bleed. Patient reported of no history of rectal bleed but today has been more than usual bright red blood per rectal area.  One episode, started today associated with mild abdominal discomfort but denies having any diarrhea or constipation.  Patient currently stable, denies of having any recent illnesses, denies of having any fever, chills, nausea or vomiting.  Denies of having any upper respiratory symptoms.  Denies any chest pain or shortness of breath.  Diffuse mild abdominal discomfort/pain.  Denies any dysuria.  Denies any joint pain open wounds or rash.    ED Course:   Patient was evaluated: Vitals were stable, hemoglobin 13.1 Rectal exam per ED [facility] reported hemorrhoids, with mild bleeding, Hemoccult positive He has called Dr. Watt Climes was recommended for patient to be admitted for observation. Call him if the bleeding continues and H&H drops.   Hospital course:  Acute lower GI bleed Patient has a history of diverticulosis. Bleeding resolved with overnight observation. Hemoglobin on admission was 13, dropped to  11.7 but stable with repeat hemoglobin of 12.4 prior to discharge. Likely some dilutional effect from IV fluids.  Hyperkalemia Resolved with IV fluids  Abdominal pain LLQ. Chronic. Abdominal x-ray without etiology. Follow-up with PCP  Essential hypertension Continue metoprolol and amlodipine  Hypothyroidism Continue Synthroid  Diabetes mellitus, type 2 Continue home glipizide  Chronic diastolic heart failure Stable. Continue lasix  History of renal cancer S/p right nephrectomy  Discharge Diagnoses:  Principal Problem:   GI bleed Active Problems:   Essential hypertension   Hypothyroidism   CKD (chronic kidney disease), stage III (HCC)   Type 2 diabetes mellitus with diabetic polyneuropathy, without long-term current use of insulin (HCC)   Hyperlipidemia   Acute lower GI bleeding   Chronic diastolic CHF (congestive heart failure) (HCC)   S/p nephrectomy    Discharge Instructions   Allergies as of 04/11/2019      Reactions   Ace Inhibitors Swelling   See 02/16/19    Codeine Other (See Comments)   hallucinations   Metformin And Related    Upset stomach   Ciprocin-fluocin-procin [fluocinolone Acetonide] Other (See Comments)   Unknown reaction   Clonidine Derivatives Other (See Comments)   Dry mouth   Crestor [rosuvastatin Calcium] Other (See Comments)   cramps   Penicillins Other (See Comments)   Has patient had a PCN reaction causing immediate rash, facial/tongue/throat swelling, SOB or lightheadedness with hypotension: no Has patient had a PCN reaction causing severe rash involving mucus membranes or skin necrosis: no Has patient had a PCN reaction that required hospitalization : no Has patient had a PCN reaction occurring within the last 10 years: no -Hallucinates If  all of the above answers are "NO", then may proceed with Cephalosporin use.   Simvastatin Other (See Comments)   Upset stomach   Tessalon Perles Other (See Comments)   Gi upset       Medication List    STOP taking these medications   diclofenac sodium 1 % Gel Commonly known as:  VOLTAREN     TAKE these medications   acetaminophen 500 MG tablet Commonly known as:  TYLENOL Take 500 mg by mouth every 6 (six) hours as needed for moderate pain.   amLODipine 10 MG tablet Commonly known as:  NORVASC Take 1 tablet (10 mg total) by mouth daily.   cholecalciferol 1000 units tablet Commonly known as:  VITAMIN D Take 1,000 Units by mouth daily.   clonazePAM 1 MG tablet Commonly known as:  KLONOPIN TAKE 1/2 TABLET BY MOUTH EVERY MORNING AND TAKE ONE TABLET BY MOUTH AT BEDTIME What changed:    how much to take  how to take this  when to take this   Eczema Moisturizing 1 % Lotn Generic drug:  Colloidal Oatmeal Apply 1 application topically as needed. Eczema cream   fluticasone 50 MCG/ACT nasal spray Commonly known as:  FLONASE Place 1-2 sprays into both nostrils at bedtime. What changed:  how much to take   furosemide 40 MG tablet Commonly known as:  LASIX TAKE ONE TABLET BY MOUTH DAILY   glipiZIDE 10 MG tablet Commonly known as:  GLUCOTROL TAKE TWO TABLETS BY MOUTH TWICE A DAY 30 MINUTES BEFORE A MEAL What changed:  See the new instructions.   hydroxypropyl methylcellulose / hypromellose 2.5 % ophthalmic solution Commonly known as:  ISOPTO TEARS / GONIOVISC Place 2 drops into both eyes 3 (three) times daily as needed for dry eyes.   levothyroxine 88 MCG tablet Commonly known as:  SYNTHROID TAKE ONE TABLET BY MOUTH DAILY BEFORE BREAKFAST What changed:  See the new instructions.   metoprolol tartrate 100 MG tablet Commonly known as:  LOPRESSOR Take 1 tablet (100 mg total) by mouth 2 (two) times daily.   omeprazole 40 MG capsule Commonly known as:  PRILOSEC TAKE ONE CAPSULE BY MOUTH DAILY   polyethylene glycol 17 g packet Commonly known as:  MIRALAX / GLYCOLAX Take 17 g by mouth daily.   vitamin E 100 UNIT capsule Take 100 Units by mouth  daily.       Allergies  Allergen Reactions  . Ace Inhibitors Swelling    See 02/16/19   . Codeine Other (See Comments)    hallucinations  . Metformin And Related     Upset stomach  . Ciprocin-Fluocin-Procin [Fluocinolone Acetonide] Other (See Comments)    Unknown reaction  . Clonidine Derivatives Other (See Comments)    Dry mouth  . Crestor [Rosuvastatin Calcium] Other (See Comments)    cramps  . Penicillins Other (See Comments)    Has patient had a PCN reaction causing immediate rash, facial/tongue/throat swelling, SOB or lightheadedness with hypotension: no Has patient had a PCN reaction causing severe rash involving mucus membranes or skin necrosis: no Has patient had a PCN reaction that required hospitalization : no Has patient had a PCN reaction occurring within the last 10 years: no -Hallucinates If all of the above answers are "NO", then may proceed with Cephalosporin use.   . Simvastatin Other (See Comments)    Upset stomach   . Tessalon Perles Other (See Comments)    Gi upset     Consultations:  [facility] (in ED)  Procedures/Studies:  No results found.    Subjective: Some LLQ abdominal pain which has been present for the last few months. Bowel movement overnight without blood  Discharge Exam: Vitals:   04/11/19 0619 04/11/19 0828  BP: (!) 144/72   Pulse: 63   Resp: 16   Temp: 98 F (36.7 C)   SpO2: 99% 99%   Vitals:   04/10/19 2045 04/11/19 0500 04/11/19 0619 04/11/19 0828  BP: (!) 157/88  (!) 144/72   Pulse: 75  63   Resp: 16  16   Temp: 97.8 F (36.6 C)  98 F (36.7 C)   TempSrc: Oral  Oral   SpO2: 98%  99% 99%  Weight:  76.9 kg    Height:        General: Pt is alert, awake, not in acute distress Cardiovascular: RRR, S1/S2 +, no rubs, no gallops Respiratory: CTA bilaterally, no wheezing, no rhonchi Abdominal: Soft, some LLQ tenderness, ND, bowel sounds + Extremities: no edema, no cyanosis    The results of significant  diagnostics from this hospitalization (including imaging, microbiology, ancillary and laboratory) are listed below for reference.     Microbiology: No results found for this or any previous visit (from the past 240 hour(s)).   Labs: BNP (last 3 results) Recent Labs    04/10/19 1715  BNP 09.9   Basic Metabolic Panel: Recent Labs  Lab 04/10/19 1206 04/10/19 1715 04/11/19 0423  NA 139  --  142  K 5.9*  --  4.0  CL 105  --  109  CO2 26  --  24  GLUCOSE 216*  --  96  BUN 14  --  8  CREATININE 1.47*  --  1.13*  CALCIUM 9.1 9.7 9.0  MG  --  2.1  --   PHOS  --  2.4*  --    Liver Function Tests: Recent Labs  Lab 04/10/19 1206  AST 64*  ALT 23  ALKPHOS 68  BILITOT 1.8*  PROT 7.4  ALBUMIN 4.1   Recent Labs  Lab 04/10/19 1206  LIPASE 42   No results for input(s): AMMONIA in the last 168 hours. CBC: Recent Labs  Lab 04/10/19 1206 04/10/19 1715 04/11/19 0423 04/11/19 1301  WBC 5.6  --  6.4  --   NEUTROABS 2.8  --   --   --   HGB 13.1 13.0 11.7* 12.4  HCT 40.1 40.6 37.8 38.6  MCV 100.3*  --  99.7  --   PLT 213  --  191  --    Cardiac Enzymes: No results for input(s): CKTOTAL, CKMB, CKMBINDEX, TROPONINI in the last 168 hours. BNP: Invalid input(s): POCBNP CBG: Recent Labs  Lab 04/10/19 1541 04/10/19 1724 04/10/19 2236 04/11/19 0820 04/11/19 1128  GLUCAP 84 69* 105* 119* 125*   D-Dimer No results for input(s): DDIMER in the last 72 hours. Hgb A1c No results for input(s): HGBA1C in the last 72 hours. Lipid Profile No results for input(s): CHOL, HDL, LDLCALC, TRIG, CHOLHDL, LDLDIRECT in the last 72 hours. Thyroid function studies No results for input(s): TSH, T4TOTAL, T3FREE, THYROIDAB in the last 72 hours.  Invalid input(s): FREET3 Anemia work up No results for input(s): VITAMINB12, FOLATE, FERRITIN, TIBC, IRON, RETICCTPCT in the last 72 hours. Urinalysis    Component Value Date/Time   COLORURINE YELLOW 01/19/2018 0835   APPEARANCEUR HAZY (A)  01/19/2018 0835   LABSPEC 1.020 01/19/2018 0835   PHURINE 5.0 01/19/2018 0835   GLUCOSEU NEGATIVE 01/19/2018 8338  HGBUR NEGATIVE 01/19/2018 0835   BILIRUBINUR negative 08/25/2018 1001   BILIRUBINUR small 11/09/2014 1629   KETONESUR negative 08/25/2018 1001   [place] 01/19/2018 0835   PROTEINUR trace (A) 08/25/2018 1001   PROTEINUR NEGATIVE 01/19/2018 0835   UROBILINOGEN 0.2 08/25/2018 1001   UROBILINOGEN 0.2 04/28/2013 2321   NITRITE Negative 08/25/2018 1001   NITRITE NEGATIVE 01/19/2018 0835   LEUKOCYTESUR Small (1+) (A) 08/25/2018 1001    SIGNED:   Cordelia Poche, MD [facility] 04/11/2019, 2:06 PM

## 2019-04-11 NOTE — Evaluation (Signed)
Physical Therapy Evaluation Patient Details Name: Sherri Burns MRN: [id number] DOB: 1936-02-10 Today's Date: 04/11/2019   History of Present Illness  83 y.o. female with medical history significant of HTN/ HLD/ HPT/ CKD/GERD/CHF/DM II/ anxiety /with history of renal cancer right nephrectomy admitted with lower GIB   Clinical Impression  Pt is independent with mobility, she ambulated 250' without an assistive device, no loss of balance. No further PT indicated, will sign off.     Follow Up Recommendations No PT follow up    Equipment Recommendations  None recommended by PT    Recommendations for Other Services       Precautions / Restrictions Precautions Precautions: None Precaution Comments: no falls in past 1 year Restrictions Weight Bearing Restrictions: No      Mobility  Bed Mobility Overal bed mobility: Independent                Transfers Overall transfer level: Independent                  Ambulation/Gait Ambulation/Gait assistance: Independent Gait Distance (Feet): 250 Feet Assistive device: None Gait Pattern/deviations: WFL(Within Functional Limits) Gait velocity: WNL   General Gait Details: steady, no loss of balance  Stairs            Wheelchair Mobility    Modified Rankin (Stroke Patients Only)       Balance Overall balance assessment: Independent                                           Pertinent Vitals/Pain Pain Assessment: 0-10 Pain Score: 4  Pain Location: lower abdomen Pain Descriptors / Indicators: Aching Pain Intervention(s): Limited activity within patient's tolerance;Monitored during session    Home Living Family/patient expects to be discharged to:: Private residence Living Arrangements: Alone Available Help at Discharge: Family;Available PRN/intermittently Type of Home: House Home Access: Stairs to enter   [employer] of Steps: 2 Home Layout: One level Home Equipment:  Cane - single point      Prior Function Level of Independence: Independent         Comments: does her own cooking and cleaning; children assist with mowing     Hand Dominance        Extremity/Trunk Assessment   Upper Extremity Assessment Upper Extremity Assessment: Overall WFL for tasks assessed    Lower Extremity Assessment Lower Extremity Assessment: Overall WFL for tasks assessed    Cervical / Trunk Assessment Cervical / Trunk Assessment: Normal  Communication   Communication: No difficulties  Cognition Arousal/Alertness: Awake/alert Behavior During Therapy: WFL for tasks assessed/performed Overall Cognitive Status: Within Functional Limits for tasks assessed                                        General Comments      Exercises     Assessment/Plan    PT Assessment Patent does not need any further PT services  PT Problem List         PT Treatment Interventions      PT Goals (Current goals can be found in the Care Plan section)  Acute Rehab PT Goals Patient Stated Goal: flower gardening PT Goal Formulation: All assessment and education complete, DC therapy    Frequency     Barriers to discharge  Co-evaluation               AM-PAC PT "6 Clicks" Mobility  Outcome Measure Help needed turning from your back to your side while in a flat bed without using bedrails?: None Help needed moving from lying on your back to sitting on the side of a flat bed without using bedrails?: None Help needed moving to and from a bed to a chair (including a wheelchair)?: None Help needed standing up from a chair using your arms (e.g., wheelchair or bedside chair)?: None Help needed to walk in hospital room?: None Help needed climbing 3-5 steps with a railing? : None 6 Click Score: 24    End of Session Equipment Utilized During Treatment: Gait belt Activity Tolerance: Patient tolerated treatment well Patient left: in bed;with call  bell/phone within reach Nurse Communication: Mobility status      Time: 5009-3818 PT Time Calculation (min) (ACUTE ONLY): 15 min   Charges:   PT Evaluation $PT Eval Low Complexity: 1 Low          Philomena Doheny PT 04/11/2019  Acute Rehabilitation Services Pager [phone] Office [phone]

## 2019-04-11 NOTE — Care Management Obs Status (Signed)
[place] NOTIFICATION   Patient Details  Name: Sherri Burns MRN: [id number] Date of Birth: 02/20/1936   Medicare Observation Status Notification Given:  Yes    Erenest Rasher, RN 04/11/2019, 6:02 PM

## 2019-04-11 NOTE — TOC Initial Note (Signed)
Transition of Care [facility]) - Initial/Assessment Note    Patient Details  Name: Sherri Burns MRN: [id number] Date of Birth: [birth year]  [facility] of [facility] CM/SW Contact:    Erenest Rasher, RN Phone Number: 04/11/2019, 6:02 PM  Clinical Narrative:                  Spoke to pt and states she is independent at home. Her daughter assist with her care at home as needed.    Expected Discharge Plan: Home/Self Care Barriers to Discharge: No Barriers Identified   Patient Goals and CMS Choice Patient states their goals for this hospitalization and ongoing recovery are:: do well at home CMS Medicare.gov Compare Post Acute Care list provided to:: Patient Choice offered to / list presented to : Patient  Expected Discharge Plan and Services Expected Discharge Plan: Home/Self Care   Discharge Planning Services: CM Consult   Living arrangements for the past 2 months: Single Family Home Expected Discharge Date: 04/11/19               DME Arranged: N/A                    Prior Living Arrangements/Services Living arrangements for the past 2 months: Single Family Home Lives with:: Self Patient language and need for interpreter reviewed:: Yes Do you feel safe going back to the place where you live?: Yes      Need for Family Participation in Patient Care: No (Comment) Care giver support system in place?: No (comment)   Criminal Activity/Legal Involvement Pertinent to Current Situation/Hospitalization: No - Comment as needed  Activities of Daily Living Home Assistive Devices/Equipment: None ADL Screening (condition at time of admission) Patient's cognitive ability adequate to safely complete daily activities?: No Is the patient deaf or have difficulty hearing?: No Does the patient have difficulty seeing, even when wearing glasses/contacts?: Yes Does the patient have difficulty concentrating, remembering, or making decisions?: No Patient able to express need for  assistance with ADLs?: Yes Does the patient have difficulty dressing or bathing?: No Independently performs ADLs?: Yes (appropriate for developmental age) Does the patient have difficulty walking or climbing stairs?: Yes Weakness of Legs: Both Weakness of Arms/Hands: Right  Permission Sought/Granted Permission sought to share information with : Case Manager, Family Supports Permission granted to share information with : Yes, Verbal Permission Granted  Share Information with NAME: Wolfgang Phoenix     Permission granted to share info w Relationship: daughter  Permission granted to share info w Contact Information: [phone]  Emotional Assessment Appearance:: Appears stated age Attitude/Demeanor/Rapport: Gracious Affect (typically observed): Accepting Orientation: : Oriented to Self, Oriented to Place, Oriented to  Time, Oriented to Situation   Psych Involvement: No (comment)  Admission diagnosis:  rectal bleed Patient Active Problem List   Diagnosis Date Noted  . GI bleed 04/10/2019  . S/p nephrectomy 06/24/2018  . Chronic diastolic CHF (congestive heart failure) ([place]) 02/21/2018  . Acute lower GI bleeding 02/20/2018  . Hyperlipidemia   . Abnormal EKG 02/07/2016  . Type 2 diabetes mellitus with diabetic polyneuropathy, without long-term current use of insulin ([place]) 09/23/2012  . Essential hypertension   . Hypothyroidism   . CKD (chronic kidney disease), stage III ([place])    PCP:  Rutherford Guys, MD Pharmacy:   [facility] [address] [place] [address] Phone: [phone] Fax: [place], [place] - [place]  [place] [employer] [address] [place] [address] Phone: [phone] Fax: [phone]     Social Determinants of Health (SDOH) Interventions    Readmission Risk Interventions No flowsheet data found.

## 2019-04-15 ENCOUNTER — Other Ambulatory Visit: Payer: Self-pay | Admitting: Family Medicine

## 2019-04-15 NOTE — Telephone Encounter (Signed)
Requested medication (s) are due for refill today: no  Requested medication (s) are on the active medication list: No  Last refill:  Listed as historical med  Future visit scheduled: yes  Notes to clinic:  Unable to refill, medication listed as historical med    Requested Prescriptions  Pending Prescriptions Disp Refills   polyethylene glycol powder (GLYCOLAX/MIRALAX) 17 GM/SCOOP powder [Pharmacy Med Name: POLYETHYLENE GLYCOL 3350 17G PWD] 255 g     Sig: TAKE [place]     Gastroenterology:  Laxatives Passed - 04/15/2019  3:59 PM      Passed - Valid encounter within last 12 months    Recent Outpatient Visits          1 month ago Allergic reaction, initial encounter   [facility], Ranell Patrick, MD   2 months ago Type 2 diabetes mellitus with diabetic polyneuropathy, without long-term current use of insulin [facility])   [facility], Lilia Argue, MD   7 months ago Lower abdominal pain   [facility], Lilia Argue, MD   9 months ago Lower abdominal pain   [facility], Lilia Argue, MD   10 months ago Hypercalcemia   [facility], Lilia Argue, MD      Future Appointments            In 3 weeks Rutherford Guys, MD [facility] at [place], [facility]

## 2019-04-30 ENCOUNTER — Other Ambulatory Visit: Payer: Self-pay | Admitting: Family Medicine

## 2019-04-30 ENCOUNTER — Ambulatory Visit
Admission: RE | Admit: 2019-04-30 | Discharge: 2019-04-30 | Disposition: A | Discharge status: Home / Self Care | Payer: Medicare Other | Source: Ambulatory Visit | Attending: Nephrology | Admitting: Nephrology

## 2019-04-30 ENCOUNTER — Other Ambulatory Visit: Payer: Self-pay

## 2019-04-30 DIAGNOSIS — I129 Hypertensive chronic kidney disease with stage 1 through stage 4 chronic kidney disease, or unspecified chronic kidney disease: Secondary | ICD-10-CM

## 2019-04-30 DIAGNOSIS — N183 Chronic kidney disease, stage 3 unspecified: Secondary | ICD-10-CM

## 2019-04-30 DIAGNOSIS — I5032 Chronic diastolic (congestive) heart failure: Secondary | ICD-10-CM

## 2019-05-01 ENCOUNTER — Other Ambulatory Visit: Payer: Self-pay | Admitting: Family Medicine

## 2019-05-01 NOTE — Telephone Encounter (Signed)
Requested Prescriptions  Pending Prescriptions Disp Refills  . metoprolol tartrate (LOPRESSOR) 100 MG tablet [Pharmacy Med Name: METOPROLOL TARTRATE 100MG  TAB] 180 tablet 0    Sig: TAKE ONE TABLET BY MOUTH TWICE A DAY     Cardiovascular:  Beta Blockers Passed - 05/01/2019 10:01 AM      Passed - Last BP in normal range    BP Readings from Last 1 Encounters:  04/11/19 121/64         Passed - Last Heart Rate in normal range    Pulse Readings from Last 1 Encounters:  04/11/19 64         Passed - Valid encounter within last 6 months    Recent Outpatient Visits          2 months ago Allergic reaction, initial encounter   [facility], Ranell Patrick, MD   3 months ago Type 2 diabetes mellitus with diabetic polyneuropathy, without long-term current use of insulin [facility])   [facility], Lilia Argue, MD   8 months ago Lower abdominal pain   [facility], Lilia Argue, MD   10 months ago Lower abdominal pain   [facility], Lilia Argue, MD   11 months ago Hypercalcemia   [facility], Lilia Argue, MD      Future Appointments            In 5 days Rutherford Guys, MD Primary [facility] at [place], [facility] [place]

## 2019-05-06 ENCOUNTER — Other Ambulatory Visit: Payer: Self-pay

## 2019-05-06 ENCOUNTER — Ambulatory Visit (INDEPENDENT_AMBULATORY_CARE_PROVIDER_SITE_OTHER): Payer: Medicare Other | Admitting: Family Medicine

## 2019-05-06 ENCOUNTER — Encounter: Payer: Self-pay | Admitting: Family Medicine

## 2019-05-06 VITALS — BP 134/86 | HR 100 | Temp 98.6°F | Ht 66.0 in | Wt 170.0 lb

## 2019-05-06 DIAGNOSIS — I10 Essential (primary) hypertension: Secondary | ICD-10-CM | POA: Diagnosis not present

## 2019-05-06 DIAGNOSIS — E1142 Type 2 diabetes mellitus with diabetic polyneuropathy: Secondary | ICD-10-CM

## 2019-05-06 DIAGNOSIS — N183 Chronic kidney disease, stage 3 unspecified: Secondary | ICD-10-CM

## 2019-05-06 DIAGNOSIS — Z8719 Personal history of other diseases of the digestive system: Secondary | ICD-10-CM

## 2019-05-06 MED ORDER — HYDROCORTISONE ACETATE 25 MG RE SUPP: 25.0000 mg | Freq: Two times a day (BID) | RECTAL | 0 refills | Status: DC

## 2019-05-06 NOTE — Progress Notes (Signed)
6/10/20202:22 PM  Sherri Burns 1936/09/08, 83 y.o., female [id number]  Chief Complaint  Patient presents with  . Hypertension    having pain in the neck on the right side. Needs refill on amlopdipine and metoprolol  . Hyperthyroidism  . Follow-up     from on er visit, stomach pain and bleeding from rectum  due to hemorroid    HPI:   Patient is a 83 y.o. female with past medical history significant for DM2, CKD3, s/p nephrectomy, HTN, anxiety on long term bzd, hypothyroidismwho presents today for routine followup  Last OV March 2020 Restarted atorvastatin 10mg  Saw Dr Carlota Raspberry in about 2 weeks later for concerns of angioedema, stopped ACE and statin hosp 5/15-16 for rectal bleeding - hemorrhoids, did not give medication for hemorrhoids  Patient reports that she thinks bleeding is from diverticulitis Since ER visit she had one more episode but very mild Still having LLQ and perineal abd pain, bloating This morning had normal BM Denies any fever but has been having chills She would like referral to GI, used to see Dr Thana Farr, but he is now with wake CT scan march 2019 - diverticulosis, diverticulum, left inguinal hernia, s/p hyst Colonoscopy in 2017 - polyps, int hemorrhoid, s/p banding  Last Sunday, was having pain down from throat down to upper mid chest, resolved with resting, has not happened since  Anxiety stable, cont to take daily clonazepam pmp reviewed  Denies any dizziness, lightheadedness, palpitations, SOB, chest pain, edema  Has EMS alert system  CBC Latest Ref Rng & Units 04/11/2019 04/11/2019 04/10/2019  WBC 4.0 - 10.5 K/uL - 6.4 -  Hemoglobin 12.0 - 15.0 g/dL 12.4 11.7(L) 13.0  Hematocrit 36.0 - 46.0 % 38.6 37.8 40.6  Platelets 150 - 400 K/uL - 191 -    Lab Results  Component Value Date   HGBA1C 7.4 (A) 01/30/2019   HGBA1C 7.4 (H) 08/25/2018   HGBA1C 7.3 (H) 05/27/2018   Lab Results  Component Value Date   MICROALBUR 0.7 10/02/2016   LDLCALC  144 (H) 01/30/2019   CREATININE 1.13 (H) 04/11/2019    Fall Risk  05/06/2019 02/16/2019 01/30/2019 08/25/2018 05/27/2018  Falls in the past year? 0 0 0 No No  Number falls in past yr: 0 0 0 - -  Injury with Fall? 0 0 0 - -  Follow up - Falls evaluation completed - - -     Depression screen [facility] 2/9 05/06/2019 02/16/2019 01/30/2019  Decreased Interest 0 0 0  Down, Depressed, Hopeless 0 1 0  PHQ - 2 Score 0 1 0  Altered sleeping - 1 -  Tired, decreased energy - 1 -  Change in appetite - 1 -  Feeling bad or failure about yourself  - 0 -  Trouble concentrating - 0 -  Moving slowly or fidgety/restless - 0 -  Suicidal thoughts - 0 -  PHQ-9 Score - 4 -  Difficult doing work/chores - Not difficult at all -  Some recent data might be hidden    Allergies  Allergen Reactions  . Ace Inhibitors Swelling    See 02/16/19   . Codeine Other (See Comments)    hallucinations  . Metformin And Related     Upset stomach  . Ciprocin-Fluocin-Procin [Fluocinolone Acetonide] Other (See Comments)    Unknown reaction  . Clonidine Derivatives Other (See Comments)    Dry mouth  . Crestor [Rosuvastatin Calcium] Other (See Comments)    cramps  . Penicillins Other (See Comments)  Has patient had a PCN reaction causing immediate rash, facial/tongue/throat swelling, SOB or lightheadedness with hypotension: no Has patient had a PCN reaction causing severe rash involving mucus membranes or skin necrosis: no Has patient had a PCN reaction that required hospitalization : no Has patient had a PCN reaction occurring within the last 10 years: no -Hallucinates If all of the above answers are "NO", then may proceed with Cephalosporin use.   . Simvastatin Other (See Comments)    Upset stomach   . Tessalon Perles Other (See Comments)    Gi upset     Prior to Admission medications   Medication Sig Start Date End Date Taking? Authorizing Provider  acetaminophen (TYLENOL) 500 MG tablet Take 500 mg by mouth every 6  (six) hours as needed for moderate pain.   Yes [provider]  amLODipine (NORVASC) 10 MG tablet Take 1 tablet (10 mg total) by mouth daily. 01/30/19  Yes Rutherford Guys, MD  cholecalciferol (VITAMIN D) 1000 units tablet Take 1,000 Units by mouth daily.   Yes [provider]  clonazePAM (KLONOPIN) 1 MG tablet TAKE 1/2 TABLET BY MOUTH EVERY MORNING AND TAKE ONE TABLET BY MOUTH AT BEDTIME Patient taking differently: Take 0.5 mg by mouth 2 (two) times daily. TAKE 1/2 TABLET BY MOUTH EVERY MORNING AND TAKE ONE TABLET BY MOUTH AT BEDTIME 01/30/19  Yes Rutherford Guys, MD  Colloidal Oatmeal (ECZEMA MOISTURIZING) 1 % LOTN Apply 1 application topically as needed. Eczema cream   Yes [provider]  fluticasone (FLONASE) 50 MCG/ACT nasal spray Place 1-2 sprays into both nostrils at bedtime. Patient taking differently: Place 1 spray into both nostrils at bedtime.  01/30/19  Yes Rutherford Guys, MD  furosemide (LASIX) 40 MG tablet TAKE ONE TABLET BY MOUTH DAILY 04/30/19  Yes Rutherford Guys, MD  glipiZIDE (GLUCOTROL) 10 MG tablet TAKE TWO TABLETS BY MOUTH TWICE A DAY 30 MINUTES BEFORE A MEAL Patient taking differently: Take 20 mg by mouth 2 (two) times daily before a meal.  03/12/19  Yes Rutherford Guys, MD  hydroxypropyl methylcellulose / hypromellose (ISOPTO TEARS / GONIOVISC) 2.5 % ophthalmic solution Place 2 drops into both eyes 3 (three) times daily as needed for dry eyes.   Yes [provider]  levothyroxine (SYNTHROID) 88 MCG tablet TAKE ONE TABLET BY MOUTH DAILY BEFORE BREAKFAST Patient taking differently: Take 88 mcg by mouth daily before breakfast.  03/18/19  Yes Rutherford Guys, MD  metoprolol tartrate (LOPRESSOR) 100 MG tablet TAKE ONE TABLET BY MOUTH TWICE A DAY 05/01/19  Yes Rutherford Guys, MD  omeprazole (PRILOSEC) 40 MG capsule TAKE ONE CAPSULE BY MOUTH DAILY Patient taking differently: Take 40 mg by mouth daily.  03/18/19  Yes Rutherford Guys, MD  polyethylene  glycol (MIRALAX / GLYCOLAX) 17 g packet Take 17 g by mouth daily.   Yes [provider]  polyethylene glycol powder (GLYCOLAX/MIRALAX) 17 GM/SCOOP powder TAKE 17 GRAMS BY MOUTH EVERY MORNING 04/21/19  Yes Rutherford Guys, MD  vitamin E 100 UNIT capsule Take 100 Units by mouth daily.   Yes [provider]    Past Medical History:  Diagnosis Date  . Abnormal EKG 02/07/2016   Inferolateral T wave inversion and ST depression.  . Acute calculous cholecystitis   . Anxiety   . Arthritis   . Chronic diastolic CHF (congestive heart failure) ([place]) 02/21/2018  . Colon polyps 11/2008   tubular adenoma  . Diabetes mellitus   . Esophageal  problem    esophageal dilation  . GERD (gastroesophageal reflux disease)    + hpylori  . GI bleeding 01/2018  . Headache   . Hyperlipidemia   . Hypertension   . Hypothyroidism   . Ischemic colitis ([place])   . Renal cancer, right ([place]) 2010   s/p Rt nephrectomy by Dr. Rosana Hoes  . Renal insufficiency     Past Surgical History:  Procedure Laterality Date  . ABDOMINAL HYSTERECTOMY  1978   partial  . CHOLECYSTECTOMY N/A 04/04/2016   Procedure: LAPAROSCOPIC CHOLECYSTECTOMY;  Surgeon: Autumn Messing III, MD;  Location: [facility];  Service: General;  Laterality: N/A;  . COLONOSCOPY    . ESOPHAGEAL DILATION  2016  . HERNIA REPAIR    . LAPAROSCOPIC CHOLECYSTECTOMY  04/04/2016  . NEPHRECTOMY Right 2010   Dr. Rosana Hoes, urology, due to renal cancer    Social History   Tobacco Use  . Smoking status: Current Some Day Smoker    Packs/day: 0.25    Years: 55.00    Pack years: 13.75    Types: Cigarettes  . Smokeless tobacco: Never Used  . Tobacco comment: cut back amount still  trying to quit  Substance Use Topics  . Alcohol use: No    Alcohol/week: 0.0 standard drinks    Family History  Problem Relation Age of Onset  . Cancer Mother        colon, kidney cancer  . Cancer Father        throat cancer  . Cancer Sister   . Heart attack Brother     ROS  Per hpi  OBJECTIVE:  Today's Vitals   05/06/19 1409  BP: 134/86  Pulse: 100  Temp: 98.6 F (37 C)  TempSrc: Oral  SpO2: 100%  Weight: 170 lb (77.1 kg)  Height: 5\' 6"  (1.676 m)   Body mass index is 27.44 kg/m.   Physical Exam Vitals signs and nursing note reviewed.  Constitutional:      Appearance: She is well-developed.  HENT:     Head: Normocephalic and atraumatic.     Mouth/Throat:     Pharynx: No oropharyngeal exudate.  Eyes:     General: No scleral icterus.    Conjunctiva/sclera: Conjunctivae normal.     Pupils: Pupils are equal, round, and reactive to light.  Neck:     Musculoskeletal: Neck supple.  Cardiovascular:     Rate and Rhythm: Normal rate and regular rhythm.     Heart sounds: Normal heart sounds. No murmur. No friction rub. No gallop.   Pulmonary:     Effort: Pulmonary effort is normal.     Breath sounds: Normal breath sounds. No wheezing, rhonchi or rales.  Abdominal:     General: Bowel sounds are normal. There is distension.     Palpations: Abdomen is soft. There is no hepatomegaly or splenomegaly.     Tenderness: There is generalized abdominal tenderness. There is no guarding or rebound.  Musculoskeletal:     Right lower leg: No edema.     Left lower leg: No edema.  Skin:    General: Skin is warm and dry.  Neurological:     Mental Status: She is alert and oriented to person, place, and time.     ASSESSMENT and PLAN  1. Type 2 diabetes mellitus with diabetic polyneuropathy, without long-term current use of insulin ([place]) Checking labs today, medications will be adjusted as needed.  - Hemoglobin A1c - Comprehensive metabolic panel  2. CKD (chronic kidney disease), stage III (  [place]) Managed by renal. Recent US unremarkable. - Comprehensive metabolic panel  3. Essential hypertension Controlled. Continue current regime.   4. H/O lower gastrointestinal bleeding Per ER eval 2/2 hemorrhoid. Normal WBC. Trial of anusol. Referring back to GI,  might benefit from banding.  - CBC - Ambulatory referral to Gastroenterology  Other orders - hydrocortisone (ANUSOL-HC) 25 MG suppository; Place 1 suppository (25 mg total) rectally 2 (two) times daily.  Return in about 3 months (around 08/06/2019).    Rutherford Guys, MD [facility], [place] [address] Ph.  [phone] Fax [phone]

## 2019-05-06 NOTE — Patient Instructions (Signed)
° ° ° °  If you have lab work done today you will be contacted with your lab results within the next 2 weeks.  If you have not heard from us then please contact us. The fastest way to get your results is to register for My Chart. ° ° °IF you received an x-ray today, you will receive an invoice from [facility]. Please contact [facility] at [phone] with questions or concerns regarding your invoice.  ° °IF you received labwork today, you will receive an invoice from [facility]. Please contact [facility] at [phone] with questions or concerns regarding your invoice.  ° °Our billing staff will not be able to assist you with questions regarding bills from these companies. ° °You will be contacted with the lab results as soon as they are available. The fastest way to get your results is to activate your My Chart account. Instructions are located on the last page of this paperwork. If you have not heard from us regarding the results in 2 weeks, please contact this office. °  ° ° ° °

## 2019-05-07 LAB — COMPREHENSIVE METABOLIC PANEL
ALT: 13 IU/L (ref 0–32)
AST: 14 IU/L (ref 0–40)
Albumin/Globulin Ratio: 1.6 (ref 1.2–2.2)
Albumin: 4.7 g/dL (ref 3.7–4.7)
Alkaline Phosphatase: 72 IU/L (ref 39–117)
BUN/Creatinine Ratio: 12 (ref 12–28)
BUN: 15 mg/dL (ref 8–27)
Bilirubin Total: 0.4 mg/dL (ref 0.0–1.2)
CO2: 21 mmol/L (ref 20–29)
Calcium: 10.1 mg/dL (ref 8.7–10.3)
Chloride: 106 mmol/L (ref 96–106)
Creatinine, Ser: 1.23 mg/dL — ABNORMAL HIGH (ref 0.57–1.00)
GFR calc Af Amer: 48 mL/min/{1.73_m2} — ABNORMAL LOW (ref >59)
GFR calc non Af Amer: 42 mL/min/{1.73_m2} — ABNORMAL LOW (ref >59)
Globulin, Total: 2.9 g/dL (ref 1.5–4.5)
Glucose: 118 mg/dL — ABNORMAL HIGH (ref 65–99)
Potassium: 4.1 mmol/L (ref 3.5–5.2)
Sodium: 144 mmol/L (ref 134–144)
Total Protein: 7.6 g/dL (ref 6.0–8.5)

## 2019-05-07 LAB — HEMOGLOBIN A1C
Est. average glucose Bld gHb Est-mCnc: 157 mg/dL
Hgb A1c MFr Bld: 7.1 % — ABNORMAL HIGH (ref 4.8–5.6)

## 2019-05-07 LAB — CBC
Hematocrit: 39.6 % (ref 34.0–46.6)
Hemoglobin: 13.4 g/dL (ref 11.1–15.9)
MCH: 31.6 pg (ref 26.6–33.0)
MCHC: 33.8 g/dL (ref 31.5–35.7)
MCV: 93 fL (ref 79–97)
Platelets: 200 10*3/uL (ref 150–450)
RBC: 4.24 x10E6/uL (ref 3.77–5.28)
RDW: 13.7 % (ref 11.7–15.4)
WBC: 7.3 10*3/uL (ref 3.4–10.8)

## 2019-05-12 ENCOUNTER — Other Ambulatory Visit: Payer: Self-pay | Admitting: Family Medicine

## 2019-06-10 DIAGNOSIS — D631 Anemia in chronic kidney disease: Secondary | ICD-10-CM | POA: Diagnosis not present

## 2019-06-10 DIAGNOSIS — N183 Chronic kidney disease, stage 3 (moderate): Secondary | ICD-10-CM | POA: Diagnosis not present

## 2019-06-10 DIAGNOSIS — R809 Proteinuria, unspecified: Secondary | ICD-10-CM | POA: Diagnosis not present

## 2019-06-10 DIAGNOSIS — I129 Hypertensive chronic kidney disease with stage 1 through stage 4 chronic kidney disease, or unspecified chronic kidney disease: Secondary | ICD-10-CM | POA: Diagnosis not present

## 2019-06-10 DIAGNOSIS — E1122 Type 2 diabetes mellitus with diabetic chronic kidney disease: Secondary | ICD-10-CM | POA: Diagnosis not present

## 2019-06-22 ENCOUNTER — Emergency Department (HOSPITAL_BASED_OUTPATIENT_CLINIC_OR_DEPARTMENT_OTHER): Payer: Medicare Other

## 2019-06-22 ENCOUNTER — Emergency Department (HOSPITAL_COMMUNITY): Payer: Medicare Other

## 2019-06-22 ENCOUNTER — Other Ambulatory Visit: Payer: Self-pay

## 2019-06-22 ENCOUNTER — Emergency Department (HOSPITAL_COMMUNITY)
Admission: EM | Admit: 2019-06-22 | Discharge: 2019-06-22 | Disposition: A | Discharge status: Home / Self Care | Payer: Medicare Other | Attending: Emergency Medicine | Admitting: Emergency Medicine

## 2019-06-22 DIAGNOSIS — N183 Chronic kidney disease, stage 3 (moderate): Secondary | ICD-10-CM | POA: Diagnosis not present

## 2019-06-22 DIAGNOSIS — M79604 Pain in right leg: Secondary | ICD-10-CM | POA: Diagnosis not present

## 2019-06-22 DIAGNOSIS — Z79899 Other long term (current) drug therapy: Secondary | ICD-10-CM | POA: Insufficient documentation

## 2019-06-22 DIAGNOSIS — I5032 Chronic diastolic (congestive) heart failure: Secondary | ICD-10-CM | POA: Diagnosis not present

## 2019-06-22 DIAGNOSIS — E1122 Type 2 diabetes mellitus with diabetic chronic kidney disease: Secondary | ICD-10-CM | POA: Insufficient documentation

## 2019-06-22 DIAGNOSIS — M79609 Pain in unspecified limb: Secondary | ICD-10-CM

## 2019-06-22 DIAGNOSIS — R05 Cough: Secondary | ICD-10-CM | POA: Diagnosis not present

## 2019-06-22 DIAGNOSIS — E039 Hypothyroidism, unspecified: Secondary | ICD-10-CM | POA: Insufficient documentation

## 2019-06-22 DIAGNOSIS — F17210 Nicotine dependence, cigarettes, uncomplicated: Secondary | ICD-10-CM | POA: Diagnosis not present

## 2019-06-22 DIAGNOSIS — I130 Hypertensive heart and chronic kidney disease with heart failure and stage 1 through stage 4 chronic kidney disease, or unspecified chronic kidney disease: Secondary | ICD-10-CM | POA: Diagnosis not present

## 2019-06-22 DIAGNOSIS — K920 Hematemesis: Secondary | ICD-10-CM | POA: Diagnosis not present

## 2019-06-22 DIAGNOSIS — Z7984 Long term (current) use of oral hypoglycemic drugs: Secondary | ICD-10-CM | POA: Diagnosis not present

## 2019-06-22 DIAGNOSIS — R079 Chest pain, unspecified: Secondary | ICD-10-CM | POA: Diagnosis not present

## 2019-06-22 DIAGNOSIS — M7989 Other specified soft tissue disorders: Secondary | ICD-10-CM | POA: Diagnosis not present

## 2019-06-22 LAB — TROPONIN I (HIGH SENSITIVITY)
Troponin I (High Sensitivity): 6 ng/L (ref <18)
Troponin I (High Sensitivity): 11 ng/L (ref <18)

## 2019-06-22 LAB — D-DIMER, QUANTITATIVE: D-Dimer, Quant: 1.05 ug/mL-FEU — ABNORMAL HIGH (ref 0.00–0.50)

## 2019-06-22 LAB — COMPREHENSIVE METABOLIC PANEL
ALT: 16 U/L (ref 0–44)
AST: 32 U/L (ref 15–41)
Albumin: 3.9 g/dL (ref 3.5–5.0)
Alkaline Phosphatase: 62 U/L (ref 38–126)
Anion gap: 9 (ref 5–15)
BUN: 10 mg/dL (ref 8–23)
CO2: 26 mmol/L (ref 22–32)
Calcium: 9.5 mg/dL (ref 8.9–10.3)
Chloride: 104 mmol/L (ref 98–111)
Creatinine, Ser: 1.42 mg/dL — ABNORMAL HIGH (ref 0.44–1.00)
GFR calc Af Amer: 40 mL/min — ABNORMAL LOW (ref >60)
GFR calc non Af Amer: 35 mL/min — ABNORMAL LOW (ref >60)
Glucose, Bld: 145 mg/dL — ABNORMAL HIGH (ref 70–99)
Potassium: 5.2 mmol/L — ABNORMAL HIGH (ref 3.5–5.1)
Sodium: 139 mmol/L (ref 135–145)
Total Bilirubin: 1.7 mg/dL — ABNORMAL HIGH (ref 0.3–1.2)
Total Protein: 6.6 g/dL (ref 6.5–8.1)

## 2019-06-22 LAB — CBC
HCT: 41.3 % (ref 36.0–46.0)
Hemoglobin: 13.6 g/dL (ref 12.0–15.0)
MCH: 31.9 pg (ref 26.0–34.0)
MCHC: 32.9 g/dL (ref 30.0–36.0)
MCV: 96.9 fL (ref 80.0–100.0)
Platelets: 200 10*3/uL (ref 150–400)
RBC: 4.26 MIL/uL (ref 3.87–5.11)
RDW: 13.6 % (ref 11.5–15.5)
WBC: 6.5 10*3/uL (ref 4.0–10.5)
nRBC: 0 % (ref 0.0–0.2)

## 2019-06-22 LAB — POC OCCULT BLOOD, ED: Fecal Occult Bld: NEGATIVE

## 2019-06-22 MED ORDER — ACETAMINOPHEN 500 MG PO TABS
500.0000 mg | ORAL_TABLET | Freq: Once | ORAL | Status: AC
  Administered 2019-06-22: 500 mg via ORAL
  Filled 2019-06-22: qty 1

## 2019-06-22 NOTE — Discharge Instructions (Signed)
Your labs and imaging were normal today.

## 2019-06-22 NOTE — ED Triage Notes (Signed)
Pt sts this morning she woke up at 0600 today because of existing pain d/t injury in right leg and was diaphoretic, had epigastric pain, and began gagging and spitting up blood. Pt in NAD on arrival. C/o mild pain in chest and throat.

## 2019-06-22 NOTE — ED Provider Notes (Signed)
[facility] EMERGENCY DEPARTMENT Provider Note   CSN: [id number] Arrival date & time: 06/22/19  0354    History   Chief Complaint Chief Complaint  Patient presents with  . Hematemesis    HPI Sherri Burns is a 83 y.o. female with a PMH significant for type 2 diabetes, anxiety, grade 1 diastolic CHF, CKD 3 s/p nephrectomy, hypothyroidism, and hypertension who presents with hemoptysis.  She reports that she woke up this morning due to right calf pain which she attributes to a right ankle injury 3 weeks ago.  After she woke up, she felt a pins-and-needles sensation throughout her body, sharp chest pain, nausea, and diaphoresis.  Denies experiencing shortness of breath.  She then gagged and vomited up a half-dollar amount of bright red blood.  This has never occurred before.  She denies any fevers, shortness of breath, or cough before this incident.  She does note that her right calf area appears to be more swollen than the left since her injury 3 weeks ago.  She has chronic back pain and says that this pain has been bothering her lately as well.  She says that she has experienced several deaths in the family over the past year and has not felt well for quite awhile.     Past Medical History:  Diagnosis Date  . Abnormal EKG 02/07/2016   Inferolateral T wave inversion and ST depression.  . Acute calculous cholecystitis   . Anxiety   . Arthritis   . Chronic diastolic CHF (congestive heart failure) ([place]) 02/21/2018  . Colon polyps 11/2008   tubular adenoma  . Diabetes mellitus   . Esophageal problem    esophageal dilation  . GERD (gastroesophageal reflux disease)    + hpylori  . GI bleeding 01/2018  . Headache   . Hyperlipidemia   . Hypertension   . Hypothyroidism   . Ischemic colitis ([place])   . Renal cancer, right ([place]) 2010   s/p Rt nephrectomy by Dr. Rosana Hoes  . Renal insufficiency     Patient Active Problem List   Diagnosis Date Noted  . GI bleed  04/10/2019  . S/p nephrectomy 06/24/2018  . Chronic diastolic CHF (congestive heart failure) ([place]) 02/21/2018  . Acute lower GI bleeding 02/20/2018  . Hyperlipidemia   . Abnormal EKG 02/07/2016  . Type 2 diabetes mellitus with diabetic polyneuropathy, without long-term current use of insulin ([place]) 09/23/2012  . Essential hypertension   . Hypothyroidism   . CKD (chronic kidney disease), stage III [facility])     Past Surgical History:  Procedure Laterality Date  . ABDOMINAL HYSTERECTOMY  1978   partial  . CHOLECYSTECTOMY N/A 04/04/2016   Procedure: LAPAROSCOPIC CHOLECYSTECTOMY;  Surgeon: Autumn Messing III, MD;  Location: [facility];  Service: General;  Laterality: N/A;  . COLONOSCOPY    . ESOPHAGEAL DILATION  2016  . HERNIA REPAIR    . LAPAROSCOPIC CHOLECYSTECTOMY  04/04/2016  . NEPHRECTOMY Right 2010   Dr. Rosana Hoes, urology, due to renal cancer     OB History   No obstetric history on file.      Home Medications    Prior to Admission medications   Medication Sig Start Date End Date Taking? Authorizing Provider  acetaminophen (TYLENOL) 500 MG tablet Take 500 mg by mouth every 6 (six) hours as needed for moderate pain.    [provider]  amLODipine (NORVASC) 10 MG tablet Take 1 tablet (10 mg total) by mouth daily. 01/30/19   Grant Fontana  M, MD  cholecalciferol (VITAMIN D) 1000 units tablet Take 1,000 Units by mouth daily.    [provider]  clonazePAM (KLONOPIN) 1 MG tablet TAKE 1/2 TABLET BY MOUTH EVERY MORNING AND TAKE ONE TABLET BY MOUTH AT BEDTIME Patient taking differently: Take 0.5 mg by mouth 2 (two) times daily. TAKE 1/2 TABLET BY MOUTH EVERY MORNING AND TAKE ONE TABLET BY MOUTH AT BEDTIME 01/30/19   Rutherford Guys, MD  Colloidal Oatmeal (ECZEMA MOISTURIZING) 1 % LOTN Apply 1 application topically as needed. Eczema cream    [provider]  fluticasone (FLONASE) 50 MCG/ACT nasal spray Place 1-2 sprays into both nostrils at bedtime. Patient taking differently:  Place 1 spray into both nostrils at bedtime.  01/30/19   Rutherford Guys, MD  furosemide (LASIX) 40 MG tablet TAKE ONE TABLET BY MOUTH DAILY 04/30/19   Rutherford Guys, MD  glipiZIDE (GLUCOTROL) 10 MG tablet TAKE TWO TABLETS BY MOUTH TWICE A DAY 30 MINUTES BEFORE A MEAL Patient taking differently: Take 20 mg by mouth 2 (two) times daily before a meal.  03/12/19   Rutherford Guys, MD  hydrocortisone (ANUSOL-HC) 25 MG suppository Place 1 suppository (25 mg total) rectally 2 (two) times daily. 05/06/19   Rutherford Guys, MD  hydroxypropyl methylcellulose / hypromellose (ISOPTO TEARS / GONIOVISC) 2.5 % ophthalmic solution Place 2 drops into both eyes 3 (three) times daily as needed for dry eyes.    [provider]  levothyroxine (SYNTHROID) 88 MCG tablet TAKE ONE TABLET BY MOUTH DAILY BEFORE BREAKFAST Patient taking differently: Take 88 mcg by mouth daily before breakfast.  03/18/19   Rutherford Guys, MD  metoprolol tartrate (LOPRESSOR) 100 MG tablet TAKE ONE TABLET BY MOUTH TWICE A DAY 05/01/19   Rutherford Guys, MD  omeprazole (PRILOSEC) 40 MG capsule TAKE ONE CAPSULE BY MOUTH DAILY Patient taking differently: Take 40 mg by mouth daily.  03/18/19   Rutherford Guys, MD  polyethylene glycol (MIRALAX / GLYCOLAX) 17 g packet Take 17 g by mouth daily.    [provider]  vitamin E 100 UNIT capsule Take 100 Units by mouth daily.    [provider]    Family History Family History  Problem Relation Age of Onset  . Cancer Mother        colon, kidney cancer  . Cancer Father        throat cancer  . Cancer Sister   . Heart attack Brother     Social History Social History   Tobacco Use  . Smoking status: Current Some Day Smoker    Packs/day: 0.25    Years: 55.00    Pack years: 13.75    Types: Cigarettes  . Smokeless tobacco: Never Used  . Tobacco comment: cut back amount still  trying to quit  Substance Use Topics  . Alcohol use: No    Alcohol/week: 0.0 standard  drinks  . Drug use: No     Allergies   Ace inhibitors, Codeine, Metformin and related, Ciprocin-fluocin-procin [fluocinolone acetonide], Clonidine derivatives, Crestor [rosuvastatin calcium], Penicillins, Simvastatin, and Tessalon perles   Review of Systems Review of Systems  Constitutional: Positive for diaphoresis. Negative for activity change, appetite change, chills, fever and unexpected weight change.  HENT: Negative for congestion, rhinorrhea and sore throat.   Respiratory: Positive for choking. Negative for cough and shortness of breath.   Cardiovascular: Positive for chest pain and leg swelling. Negative for palpitations.  Gastrointestinal: Positive for abdominal pain and nausea.  Endocrine: Negative for cold intolerance and heat intolerance.  Genitourinary: Negative for dysuria.  Musculoskeletal: Positive for back pain.  Neurological: Negative for weakness.     Physical Exam Updated Vital Signs BP 123/77 (BP Location: Right Arm)   Pulse 84   Temp 98.8 F (37.1 C) (Oral)   Resp (!) 21   SpO2 98%   Physical Exam Constitutional:      General: She is not in acute distress. HENT:     Head: Normocephalic and atraumatic.     Right Ear: External ear normal.     Left Ear: External ear normal.     Nose: Nose normal.  Eyes:     Extraocular Movements: Extraocular movements intact.     Conjunctiva/sclera: Conjunctivae normal.  Neck:     Musculoskeletal: Normal range of motion.  Cardiovascular:     Rate and Rhythm: Normal rate and regular rhythm.     Pulses: Normal pulses.     Heart sounds: Normal heart sounds.  Pulmonary:     Effort: Pulmonary effort is normal.     Breath sounds: Normal breath sounds. No rhonchi or rales.  Abdominal:     General: Abdomen is flat. Bowel sounds are normal.     Palpations: Abdomen is soft.     Tenderness: There is abdominal tenderness (generalized).  Musculoskeletal: Normal range of motion.        General: Tenderness (ttp of right  calf and right lateral malleolus) present. No swelling.  Skin:    General: Skin is warm and dry.  Neurological:     General: No focal deficit present.     Mental Status: She is alert and oriented to person, place, and time.  Psychiatric:        Mood and Affect: Mood normal.        Behavior: Behavior normal.      ED Treatments / Results  Labs (all labs ordered are listed, but only abnormal results are displayed) Labs Reviewed  COMPREHENSIVE METABOLIC PANEL - Abnormal; Notable for the following components:      Result Value   Potassium 5.2 (*)    Glucose, Bld 145 (*)    Creatinine, Ser 1.42 (*)    Total Bilirubin 1.7 (*)    GFR calc non Af Amer 35 (*)    GFR calc Af Amer 40 (*)    All other components within normal limits  D-DIMER, QUANTITATIVE (NOT AT [facility]) - Abnormal; Notable for the following components:   D-Dimer, Quant 1.05 (*)    All other components within normal limits  CBC  POC OCCULT BLOOD, ED  TROPONIN I (HIGH SENSITIVITY)  TROPONIN I (HIGH SENSITIVITY)    EKG EKG Interpretation  Date/Time:  Monday June 22 2019 08:00:22 EDT Ventricular Rate:  81 PR Interval:    QRS Duration: 107 QT Interval:  360 QTC Calculation: 418 R Axis:   -13 Text Interpretation:  Sinus rhythm Borderline T abnormalities, diffuse leads No significant change since last tracing Confirmed by Blanchie Dessert [phone]) on 06/22/2019 8:37:08 AM   Radiology Dg Chest 2 View  Result Date: 06/22/2019 CLINICAL DATA:  Coughing up blood.  Chest pain. EXAM: CHEST - 2 VIEW COMPARISON:  01/10/2019. FINDINGS: Mediastinum hilar structures normal. Heart size normal. Low lung volumes with mild bibasilar atelectasis. No prominent pleural effusion. No pneumothorax. Degenerative change thoracic spine. IMPRESSION: Low lung volumes with mild bibasilar atelectasis. Electronically Signed   By: [place]   On: 06/22/2019 09:01   Dg Ankle 2 Views  Right  Result Date: 06/22/2019 CLINICAL DATA:  Existing  right leg pain and swelling on lateral side. EXAM: RIGHT ANKLE - 2 VIEW COMPARISON:  None. FINDINGS: There is a small bone density adjacent to the MEDIAL malleolus. There is diffuse mild soft tissue swelling of the ankle. No acute fracture or subluxation. Small plantar spur is present. IMPRESSION: Small bone density adjacent to the MEDIAL malleolus, consistent with previous or acute injury. Electronically Signed   By: Nolon Nations M.D.   On: 06/22/2019 08:59   Vas [place] Lower Extremity Venous (dvt) (only [facility])  Result Date: 06/22/2019  Lower Venous Study Indications: Pain, and Recent injury to right ankle.  Comparison Study: No prior study on file for comparison. Performing Technologist: Sharion Dove RVS  Examination Guidelines: A complete evaluation includes B-mode imaging, spectral Doppler, color Doppler, and power Doppler as needed of all accessible portions of each vessel. Bilateral testing is considered an integral part of a complete examination. Limited examinations for reoccurring indications may be performed as noted.  +---------+---------------+---------+-----------+----------+-------+ RIGHT    CompressibilityPhasicitySpontaneityPropertiesSummary +---------+---------------+---------+-----------+----------+-------+ CFV      Full           Yes      Yes                          +---------+---------------+---------+-----------+----------+-------+ SFJ      Full                                                 +---------+---------------+---------+-----------+----------+-------+ FV Prox  Full                                                 +---------+---------------+---------+-----------+----------+-------+ FV Mid   Full                                                 +---------+---------------+---------+-----------+----------+-------+ FV DistalFull                                                 +---------+---------------+---------+-----------+----------+-------+  PFV      Full                                                 +---------+---------------+---------+-----------+----------+-------+ POP      Full           Yes      Yes                          +---------+---------------+---------+-----------+----------+-------+ PTV      Full                                                 +---------+---------------+---------+-----------+----------+-------+  PERO     Full                                                 +---------+---------------+---------+-----------+----------+-------+   +----+---------------+---------+-----------+----------+-------+ LEFTCompressibilityPhasicitySpontaneityPropertiesSummary +----+---------------+---------+-----------+----------+-------+ CFV Full           Yes      Yes                          +----+---------------+---------+-----------+----------+-------+     Summary: Right: There is no evidence of deep vein thrombosis in the lower extremity. Left: No evidence of common femoral vein obstruction.  *See table(s) above for measurements and observations.    Preliminary     Procedures Procedures (including critical care time)  Medications Ordered in ED Medications  acetaminophen (TYLENOL) tablet 500 mg (500 mg Oral Given 06/22/19 1027)     Initial Impression / Assessment and Plan / ED Course  I have reviewed the triage vital signs and the nursing notes.  Pertinent labs & imaging results that were available during my care of the patient were reviewed by me and considered in my medical decision making (see chart for details).  Initially concern for PE versus pneumonia patient's history seems more consistent with hemoptysis, but when further history was obtained, it seemed to be more consistent with a hematemesis.  Due to patient's leg swelling, will obtain Doppler ultrasound, but if this is negative, will likely not need a CTA, especially given the increased risk of renal dysfunction due to her single  kidney.  Although ankle x-ray is significant for possible acute injury of the medial malleolus of right ankle, patient's pain is more lateral, so we will treat as a sprain.  Her ultrasound is negative for right lower extremity DVT.  Due to patient's recent rectal bleeding and new hematemesis, will perform a fecal occult blood test.  We will also give Tylenol for headache and neck pain.  Occult blood test is negative today.  Since patient has had no shortness of breath, no tachycardia, and no increased risk for PE, will not pursue CTA.   Troponin negative x 2.  After thorough workup, patient felt safe for discharge and was instructed to follow-up with her primary care provider if she does not feel better in the next few days.  Final Clinical Impressions(s) / ED Diagnoses   Final diagnoses:  Hematemesis with nausea    ED Discharge Orders    None       Kathrene Alu, MD 06/22/19 4403    Blanchie Dessert, MD 06/22/19 2249

## 2019-06-22 NOTE — ED Notes (Signed)
Patient transported to X-ray 

## 2019-06-22 NOTE — Progress Notes (Signed)
VASCULAR LAB PRELIMINARY  PRELIMINARY  PRELIMINARY  PRELIMINARY  Right lower extremity venous dupled completed.    Preliminary report:  See CV proc for preliminary results.  Gave report to Dr. Priscille Kluver, [facility], RVT 06/22/2019, 9:47 AM

## 2019-07-02 ENCOUNTER — Other Ambulatory Visit: Payer: Self-pay

## 2019-07-02 ENCOUNTER — Telehealth (INDEPENDENT_AMBULATORY_CARE_PROVIDER_SITE_OTHER): Payer: Medicare Other | Admitting: Family Medicine

## 2019-07-02 DIAGNOSIS — K920 Hematemesis: Secondary | ICD-10-CM | POA: Diagnosis not present

## 2019-07-02 DIAGNOSIS — F4321 Adjustment disorder with depressed mood: Secondary | ICD-10-CM

## 2019-07-02 NOTE — Progress Notes (Signed)
Virtual Visit Note  I connected with patient on 07/02/19 at 639pm by phone and verified that I am speaking with the correct person using two identifiers. Sherri Burns is currently located at home and patient is currently with them during visit. The provider, Rutherford Guys, MD is located in their office at time of visit.  I discussed the limitations, risks, security and privacy concerns of performing an evaluation and management service by telephone and the availability of in person appointments. I also discussed with the patient that there may be a patient responsible charge related to this service. The patient expressed understanding and agreed to proceed.   CC: ER followup  HPI ? Seen in ER on July 27th for hematemesis x 1  Unremarkable workup, CBC and hemoccult neg  Patient reports that she woke up really nauseous and with abd pain, had intense vomiting with blood, bright red, small clot She was feeling really weak and overall not well She denies any previous incidents She has been eating well for past 2 days - no more nausea However she is still having abd pain Denies any diarrhea  Last EGD 2016 - Dr Earlean Shawl, needed esophageal dilation, gastric polyps, non erosive gastritis  Her husband died about 2 weeks ago from cancer  Patient having transportation issues  Allergies  Allergen Reactions  . Ace Inhibitors Swelling    See 02/16/19   . Codeine Other (See Comments)    hallucinations  . Metformin And Related     Upset stomach  . Ciprocin-Fluocin-Procin [Fluocinolone Acetonide] Other (See Comments)    Unknown reaction  . Clonidine Derivatives Other (See Comments)    Dry mouth  . Crestor [Rosuvastatin Calcium] Other (See Comments)    cramps  . Penicillins Other (See Comments)    Has patient had a PCN reaction causing immediate rash, facial/tongue/throat swelling, SOB or lightheadedness with hypotension: no Has patient had a PCN reaction causing severe rash involving  mucus membranes or skin necrosis: no Has patient had a PCN reaction that required hospitalization : no Has patient had a PCN reaction occurring within the last 10 years: no -Hallucinates If all of the above answers are "NO", then may proceed with Cephalosporin use.   . Simvastatin Other (See Comments)    Upset stomach   . Tessalon Perles Other (See Comments)    Gi upset     Prior to Admission medications   Medication Sig Start Date End Date Taking? Authorizing Provider  acetaminophen (TYLENOL) 500 MG tablet Take 500 mg by mouth every 6 (six) hours as needed for moderate pain.    [provider]  amLODipine (NORVASC) 10 MG tablet Take 1 tablet (10 mg total) by mouth daily. 01/30/19   Rutherford Guys, MD  cholecalciferol (VITAMIN D) 1000 units tablet Take 1,000 Units by mouth daily.    [provider]  clonazePAM (KLONOPIN) 1 MG tablet TAKE 1/2 TABLET BY MOUTH EVERY MORNING AND TAKE ONE TABLET BY MOUTH AT BEDTIME Patient taking differently: Take 0.5 mg by mouth 2 (two) times daily. TAKE 1/2 TABLET BY MOUTH EVERY MORNING AND TAKE ONE TABLET BY MOUTH AT BEDTIME 01/30/19   Rutherford Guys, MD  Colloidal Oatmeal (ECZEMA MOISTURIZING) 1 % LOTN Apply 1 application topically as needed. Eczema cream    [provider]  fluticasone (FLONASE) 50 MCG/ACT nasal spray Place 1-2 sprays into both nostrils at bedtime. Patient taking differently: Place 1 spray into both nostrils at bedtime.  01/30/19   Pamella Pert,  Lilia Argue, MD  furosemide (LASIX) 40 MG tablet TAKE ONE TABLET BY MOUTH DAILY 04/30/19   Rutherford Guys, MD  glipiZIDE (GLUCOTROL) 10 MG tablet TAKE TWO TABLETS BY MOUTH TWICE A DAY 30 MINUTES BEFORE A MEAL Patient taking differently: Take 20 mg by mouth 2 (two) times daily before a meal.  03/12/19   Rutherford Guys, MD  hydrocortisone (ANUSOL-HC) 25 MG suppository Place 1 suppository (25 mg total) rectally 2 (two) times daily. 05/06/19   Rutherford Guys, MD  hydroxypropyl  methylcellulose / hypromellose (ISOPTO TEARS / GONIOVISC) 2.5 % ophthalmic solution Place 2 drops into both eyes 3 (three) times daily as needed for dry eyes.    [provider]  levothyroxine (SYNTHROID) 88 MCG tablet TAKE ONE TABLET BY MOUTH DAILY BEFORE BREAKFAST Patient taking differently: Take 88 mcg by mouth daily before breakfast.  03/18/19   Rutherford Guys, MD  metoprolol tartrate (LOPRESSOR) 100 MG tablet TAKE ONE TABLET BY MOUTH TWICE A DAY 05/01/19   Rutherford Guys, MD  omeprazole (PRILOSEC) 40 MG capsule TAKE ONE CAPSULE BY MOUTH DAILY Patient taking differently: Take 40 mg by mouth daily.  03/18/19   Rutherford Guys, MD  polyethylene glycol (MIRALAX / GLYCOLAX) 17 g packet Take 17 g by mouth daily.    [provider]  vitamin E 100 UNIT capsule Take 100 Units by mouth daily.    [provider]    Past Medical History:  Diagnosis Date  . Abnormal EKG 02/07/2016   Inferolateral T wave inversion and ST depression.  . Acute calculous cholecystitis   . Anxiety   . Arthritis   . Chronic diastolic CHF (congestive heart failure) ([place]) 02/21/2018  . Colon polyps 11/2008   tubular adenoma  . Diabetes mellitus   . Esophageal problem    esophageal dilation  . GERD (gastroesophageal reflux disease)    + hpylori  . GI bleeding 01/2018  . Headache   . Hyperlipidemia   . Hypertension   . Hypothyroidism   . Ischemic colitis ([place])   . Renal cancer, right ([place]) 2010   s/p Rt nephrectomy by Dr. Rosana Hoes  . Renal insufficiency     Past Surgical History:  Procedure Laterality Date  . ABDOMINAL HYSTERECTOMY  1978   partial  . CHOLECYSTECTOMY N/A 04/04/2016   Procedure: LAPAROSCOPIC CHOLECYSTECTOMY;  Surgeon: Autumn Messing III, MD;  Location: [facility];  Service: General;  Laterality: N/A;  . COLONOSCOPY    . ESOPHAGEAL DILATION  2016  . HERNIA REPAIR    . LAPAROSCOPIC CHOLECYSTECTOMY  04/04/2016  . NEPHRECTOMY Right 2010   Dr. Rosana Hoes, urology, due to renal cancer     Social History   Tobacco Use  . Smoking status: Current Some Day Smoker    Packs/day: 0.25    Years: 55.00    Pack years: 13.75    Types: Cigarettes  . Smokeless tobacco: Never Used  . Tobacco comment: cut back amount still  trying to quit  Substance Use Topics  . Alcohol use: No    Alcohol/week: 0.0 standard drinks    Family History  Problem Relation Age of Onset  . Cancer Mother        colon, kidney cancer  . Cancer Father        throat cancer  . Cancer Sister   . Heart attack Brother     ROS Per hpi  Objective  Vitals as reported by the patient: none   ASSESSMENT and PLAN  1. Hematemesis with nausea - Ambulatory referral to Gastroenterology  FOLLOW-UP: as scheduled   The above assessment and management plan was discussed with the patient. The patient verbalized understanding of and has agreed to the management plan. Patient is aware to call the clinic if symptoms persist or worsen. Patient is aware when to return to the clinic for a follow-up visit. Patient educated on when it is appropriate to go to the emergency department.    I provided 16 minutes of non-face-to-face time during this encounter.  Rutherford Guys, MD Primary Care at [facility] [place], [place] [address] Ph.  [phone] Fax [phone]

## 2019-07-02 NOTE — Progress Notes (Signed)
Sherri Burns was taken to the er for vomiting blood. She says it has not happened since, but she has been having headaches. She is also having pain in the ears and they also itch a lot. She would like to know with the clonazepam med, can she take a whole pill in the morning instead of the half that's rx'd along with a whole pill at night.

## 2019-07-03 ENCOUNTER — Other Ambulatory Visit: Payer: Self-pay

## 2019-07-03 NOTE — Patient Outreach (Signed)
[facility]) Care Management  07/03/2019  Sherri Burns [birth year] [id number]   Referral Date: 07/03/2019 Referral Source:MD referral Referral Reason: Transportation needs   Outreach Attempt: Spoke with patient.  She is able to verify HIPAA.  She reports that her grandson that took her to the doctor moved away. So she needs transportation help.  Discussed [employer] benefit for transportation.  Discussed with patient [facility] services and support.  Patient agreeable to social work for transportation needs.     Social: Patient lives alone.  She has a daughter but she works and cannot take her to appointments.  Patient independent with ADL's.  Patient has had lots of lost in her life but denies problems with depression.     Conditions:  Patient admits to type 2 diabetes, anxiety, CHF, CKD 3 s/p nephrectomy, hypothyroidism, and hypertension.  Patient states that she manages well with her health.      Medications: Patient takes medication as prescribed and medication is delivered to the home.    Appointments:  Patient saw PCP yesterday.  Patient needs help with transportation   Advanced Directives: patient does not have an advanced directive.    Plan: RN CM will refer to social work for transportation.   Jone Baseman, RN, MSN [facility] Care Management Care Management Coordinator Direct Line [phone] Toll Free: [phone]  Fax: [phone]

## 2019-07-06 ENCOUNTER — Other Ambulatory Visit: Payer: Self-pay

## 2019-07-06 NOTE — Progress Notes (Signed)
Referring Provider: Rutherford Guys, MD Primary Care Physician:  Rutherford Guys, MD  Reason for Consultation: Hematemesis   IMPRESSION:  Recent hematemesis with nausea without anemia Intermittent, asymptomatic rectal bleeding EGD for dysphagia 2016 ([place])    - emperic dilation performed at that time History of colon polyps ([facility])    - 03/12/2011: 2 tubular adenomas on the pathology result.     - 03/28/2015: one small polyp was seen. Diverticulosis. Surveillance recommended in 5 years.    - 10/23/2016 for rectal bleeding: 4 small polyps, sigmoid diverticulosis, and internal hemorrhoids Internal hemorrhoids noted on prior colonoscopy for rectal bleeding with Dr. Skip Mayer  Recent hematemesis without further GI symptoms. EGD recommended to evaluate for PUD, esophagitis, gastritis, AVM, and less likely malignancy.   Intermittnet, asymptomatic rectal bleeding is likely due to hemorrhoids. But, with her history of colon polyps she is at risk for both polyps and cancer given her last colonoscopy report from 2017.    PLAN: EGD Colonoscopy Obtain prior records from Dr. Earlean Shawl (procedure notes and path results) Ask Dr. Pamella Pert to consider evaluation for vaginal pain  Please see the "Patient Instructions" section for addition details about the plan.  HPI: Sherri Burns is a 83 y.o. female referred by Dr. Ruthy Dick for hematemesis. The history is obtained through the patient and review of her electronic health record. She has hypertension, chronic diastolic CHF, hypothyroidism, diabetes, stage III chronic kidney disease, and hyperlipidemia.  ? Seen in ER on July 27th for hematemesis x 1. She woke with nausea and abdominal pain. Had one episode of bright red hematemesis with little clots at the time.  Her work-up was unremarkable.  Hemoglobin 13.6, MCV 96.9, RDW 13.6, and hemoccult neg. No prior or further hematemesis. She has occasional early morning nausea. She has some right sided neck  pain. She denies dysphagia or odynophagia.   She has had multiple episodes of painless rectal bleeding - at least 5 or 6 over the last year. When it happens, she is concerned about the volume of blood in the commode, around the commode, and on the floor.   She reports chronic lower groin pain that shoots through her vagina. Associated bloating. No association with eating, movement, or defecation. On questioning she is not sure if the bleeding is rectal or vaginal.   No NSAIDs. No blood thinners or antiplatelet agent. Uses Tylenol as needed for pain. Her husband recently died from cancer.  Previously followed by Dr. Allyn Kenner.   EGD 03/28/2015 with Dr. Earlean Shawl for epigastric pain despite omeprazole 40 mg daily and solid food dysphagia.  Her esophagus was normal.  Multiple fundic gland polyps were identified.  There was gastritis and multiple fundic gland polyps.  H. pylori CLOtest was negative.  She was dilated with 17 mm and 18 mm savory dilators with no resistance. She notes that she had no dysphagia at that time.   Colonoscopy with Dr. Earlean Shawl 03/12/2011 showed 2 tubular adenomas on the pathology result.  I am unable to find the procedure report.  Colonoscopy 03/28/2015 with Dr. Earlean Shawl for history of colon polyps and family history of colon cancer in her mother.  A small polyp was seen.  Diverticulosis was present.  Surveillance recommended in 5 years.  Colonoscopy 10/23/2016 with Dr. Earlean Shawl for rectal bleeding, personal history of colon polyps, and family history of colon cancer revealed 4 small polyps, sigmoid diverticulosis, and internal hemorrhoids  No known family history of colon cancer or polyps. No family history of uterine/endometrial  cancer, pancreatic cancer or gastric/stomach cancer.   Past Medical History:  Diagnosis Date   Abnormal EKG 02/07/2016   Inferolateral T wave inversion and ST depression.   Acute calculous cholecystitis    Anxiety    Arthritis    Chronic diastolic CHF  (congestive heart failure) ([place]) 02/21/2018   Colon polyps 11/2008   tubular adenoma   Diabetes mellitus    Esophageal problem    esophageal dilation   GERD (gastroesophageal reflux disease)    + hpylori   GI bleeding 01/2018   Headache    Hyperlipidemia    Hypertension    Hypothyroidism    Ischemic colitis [facility])    Renal cancer, right ([place]) 2010   s/p Rt nephrectomy by Dr. Rosana Hoes   Renal insufficiency     Past Surgical History:  Procedure Laterality Date   ABDOMINAL HYSTERECTOMY  1978   partial   CHOLECYSTECTOMY N/A 04/04/2016   Procedure: LAPAROSCOPIC CHOLECYSTECTOMY;  Surgeon: Autumn Messing III, MD;  Location: [facility];  Service: General;  Laterality: N/A;   COLONOSCOPY     ESOPHAGEAL DILATION  2016   [place]  04/04/2016   NEPHRECTOMY Right 2010   Dr. Rosana Hoes, urology, due to renal cancer    Prior to Admission medications   Medication Sig Start Date End Date Taking? Authorizing Provider  acetaminophen (TYLENOL) 500 MG tablet Take 500 mg by mouth every 6 (six) hours as needed for moderate pain.    [provider]  amLODipine (NORVASC) 10 MG tablet Take 1 tablet (10 mg total) by mouth daily. 01/30/19   Rutherford Guys, MD  cholecalciferol (VITAMIN D) 1000 units tablet Take 1,000 Units by mouth daily.    [provider]  clonazePAM (KLONOPIN) 1 MG tablet TAKE 1/2 TABLET BY MOUTH EVERY MORNING AND TAKE ONE TABLET BY MOUTH AT BEDTIME Patient taking differently: Take 0.5 mg by mouth 2 (two) times daily. TAKE 1/2 TABLET BY MOUTH EVERY MORNING AND TAKE ONE TABLET BY MOUTH AT BEDTIME 01/30/19   Rutherford Guys, MD  Colloidal Oatmeal (ECZEMA MOISTURIZING) 1 % LOTN Apply 1 application topically as needed. Eczema cream    [provider]  fluticasone (FLONASE) 50 MCG/ACT nasal spray Place 1-2 sprays into both nostrils at bedtime. Patient taking differently: Place 1 spray into both nostrils at bedtime.  01/30/19    Rutherford Guys, MD  furosemide (LASIX) 40 MG tablet TAKE ONE TABLET BY MOUTH DAILY 04/30/19   Rutherford Guys, MD  glipiZIDE (GLUCOTROL) 10 MG tablet TAKE TWO TABLETS BY MOUTH TWICE A DAY 30 MINUTES BEFORE A MEAL Patient taking differently: Take 20 mg by mouth 2 (two) times daily before a meal.  03/12/19   Rutherford Guys, MD  hydrocortisone (ANUSOL-HC) 25 MG suppository Place 1 suppository (25 mg total) rectally 2 (two) times daily. 05/06/19   Rutherford Guys, MD  hydroxypropyl methylcellulose / hypromellose (ISOPTO TEARS / GONIOVISC) 2.5 % ophthalmic solution Place 2 drops into both eyes 3 (three) times daily as needed for dry eyes.    [provider]  levothyroxine (SYNTHROID) 88 MCG tablet TAKE ONE TABLET BY MOUTH DAILY BEFORE BREAKFAST Patient taking differently: Take 88 mcg by mouth daily before breakfast.  03/18/19   Rutherford Guys, MD  metoprolol tartrate (LOPRESSOR) 100 MG tablet TAKE ONE TABLET BY MOUTH TWICE A DAY 05/01/19   Rutherford Guys, MD  omeprazole (PRILOSEC) 40 MG capsule TAKE ONE CAPSULE BY MOUTH DAILY Patient  taking differently: Take 40 mg by mouth daily.  03/18/19   Rutherford Guys, MD  polyethylene glycol (MIRALAX / GLYCOLAX) 17 g packet Take 17 g by mouth daily.    [provider]  vitamin E 100 UNIT capsule Take 100 Units by mouth daily.    [provider]    Current Outpatient Medications  Medication Sig Dispense Refill   acetaminophen (TYLENOL) 500 MG tablet Take 500 mg by mouth every 6 (six) hours as needed for moderate pain.     amLODipine (NORVASC) 10 MG tablet Take 1 tablet (10 mg total) by mouth daily. 90 tablet 3   cholecalciferol (VITAMIN D) 1000 units tablet Take 1,000 Units by mouth daily.     clonazePAM (KLONOPIN) 1 MG tablet TAKE 1/2 TABLET BY MOUTH EVERY MORNING AND TAKE ONE TABLET BY MOUTH AT BEDTIME (Patient taking differently: Take 0.5 mg by mouth 2 (two) times daily. TAKE 1/2 TABLET BY MOUTH EVERY MORNING AND TAKE ONE  TABLET BY MOUTH AT BEDTIME) 45 tablet 5   Colloidal Oatmeal (ECZEMA MOISTURIZING) 1 % LOTN Apply 1 application topically as needed. Eczema cream     fluticasone (FLONASE) 50 MCG/ACT nasal spray Place 1-2 sprays into both nostrils at bedtime. (Patient taking differently: Place 1 spray into both nostrils at bedtime. ) 18 g 5   furosemide (LASIX) 40 MG tablet TAKE ONE TABLET BY MOUTH DAILY 90 tablet 0   glipiZIDE (GLUCOTROL) 10 MG tablet TAKE TWO TABLETS BY MOUTH TWICE A DAY 30 MINUTES BEFORE A MEAL (Patient taking differently: Take 20 mg by mouth 2 (two) times daily before a meal. ) 360 tablet 0   hydrocortisone (ANUSOL-HC) 25 MG suppository Place 1 suppository (25 mg total) rectally 2 (two) times daily. 12 suppository 0   hydroxypropyl methylcellulose / hypromellose (ISOPTO TEARS / GONIOVISC) 2.5 % ophthalmic solution Place 2 drops into both eyes 3 (three) times daily as needed for dry eyes.     levothyroxine (SYNTHROID) 88 MCG tablet TAKE ONE TABLET BY MOUTH DAILY BEFORE BREAKFAST (Patient taking differently: Take 88 mcg by mouth daily before breakfast. ) 90 tablet 0   metoprolol tartrate (LOPRESSOR) 100 MG tablet TAKE ONE TABLET BY MOUTH TWICE A DAY 180 tablet 0   omeprazole (PRILOSEC) 40 MG capsule TAKE ONE CAPSULE BY MOUTH DAILY (Patient taking differently: Take 40 mg by mouth daily. ) 90 capsule 0   polyethylene glycol (MIRALAX / GLYCOLAX) 17 g packet Take 17 g by mouth daily.     vitamin E 100 UNIT capsule Take 100 Units by mouth daily.     No current facility-administered medications for this visit.     Allergies as of 07/07/2019 - Review Complete 06/22/2019  Allergen Reaction Noted   Ace inhibitors Swelling 02/16/2019   Codeine Other (See Comments) 01/17/2012   Metformin and related  02/20/2018   Ciprocin-fluocin-procin [fluocinolone acetonide] Other (See Comments) 01/17/2012   Clonidine derivatives Other (See Comments) 01/17/2012   Crestor [rosuvastatin calcium] Other  (See Comments) 07/28/2015   Penicillins Other (See Comments) 01/17/2012   Simvastatin Other (See Comments) 04/21/2013   Tessalon perles Other (See Comments) 01/17/2012    Family History  Problem Relation Age of Onset   Cancer Mother        colon, kidney cancer   Cancer Father        throat cancer   Cancer Sister    Heart attack Brother     Social History   Socioeconomic History   Marital status: Widowed  Spouse name: Not on file   Number of children: 2   Years of education: Not on file   Highest education level: Not on file  Occupational History   Not on file  Social Needs   Financial resource strain: Not on file   Food insecurity    Worry: Not on file    Inability: Not on file   Transportation needs    Medical: Not on file    Non-medical: Not on file  Tobacco Use   Smoking status: Current Some Day Smoker    Packs/day: 0.25    Years: 55.00    Pack years: 13.75    Types: Cigarettes   Smokeless tobacco: Never Used   Tobacco comment: cut back amount still  trying to quit  Substance and Sexual Activity   Alcohol use: No    Alcohol/week: 0.0 standard drinks   Drug use: No   Sexual activity: Not Currently  Lifestyle   Physical activity    Days per week: Not on file    Minutes per session: Not on file   Stress: Not on file  Relationships   Social connections    Talks on phone: Not on file    Gets together: Not on file    Attends religious service: Not on file    Active member of club or organization: Not on file    Attends meetings of clubs or organizations: Not on file    Relationship status: Not on file   Intimate partner violence    Fear of current or ex partner: Not on file    Emotionally abused: Not on file    Physically abused: Not on file    Forced sexual activity: Not on file  Other Topics Concern   Not on file  Social History Narrative   Loss of son.    Review of Systems: 12 system ROS is negative except as noted  above.   Physical Exam: General:   Alert,  well-nourished, pleasant and cooperative in NAD Head:  Normocephalic and atraumatic. Eyes:  Sclera clear, no icterus.   Conjunctiva pink. Ears:  Normal auditory acuity. Nose:  No deformity, discharge,  or lesions. Mouth:  No deformity or lesions.   Neck:  Supple; no masses or thyromegaly. Lungs:  Clear throughout to auscultation.   No wheezes. Heart:  Regular rate and rhythm; no murmurs. Abdomen:  Soft,nontender, nondistended, normal bowel sounds, no rebound or guarding. No hepatosplenomegaly.   Rectal:  Deferred  Msk:  Symmetrical. No boney deformities LAD: No inguinal or umbilical LAD Extremities:  No clubbing or edema. Neurologic:  Alert and  oriented x4;  grossly nonfocal Skin:  Intact without significant lesions or rashes. Psych:  Alert and cooperative. Normal mood and affect.   Lab Results: No results for input(s): WBC, HGB, HCT, PLT in the last 72 hours. BMET No results for input(s): NA, K, CL, CO2, GLUCOSE, BUN, CREATININE, CALCIUM in the last 72 hours. LFT No results for input(s): PROT, ALBUMIN, AST, ALT, ALKPHOS, BILITOT, BILIDIR, IBILI in the last 72 hours. PT/INR No results for input(s): LABPROT, INR in the last 72 hours. Hepatitis Panel No results for input(s): HEPBSAG, HCVAB, HEPAIGM, HEPBIGM in the last 72 hours.    Studies/Results: No results found.    Phoenyx Paulsen L. Tarri Glenn, MD, MPH 07/06/2019, 4:23 PM

## 2019-07-06 NOTE — Patient Outreach (Signed)
[facility]) Care Management  07/06/2019  Sherri Burns March 08, 1936 [id number]   Social work referral received from [employer].   "Please refer patient to social work for transportation resources. Grandson used to take her to appointments but he has moved away so patient needs transportation resources for appointments" BSW and patient briefly discussed [employer] and [employer] transportation, however, patient requested a call back later in the week.  She expressed anxiety about upcoming GI appointment and said that she has a lot on her mind.  BSW agreed to call her after appointment to further discuss transportation resources.  Ronn Melena, BSW Social Worker [phone]

## 2019-07-07 ENCOUNTER — Other Ambulatory Visit: Payer: Self-pay

## 2019-07-07 ENCOUNTER — Ambulatory Visit (INDEPENDENT_AMBULATORY_CARE_PROVIDER_SITE_OTHER): Payer: Medicare Other | Admitting: Gastroenterology

## 2019-07-07 ENCOUNTER — Encounter: Payer: Self-pay | Admitting: Gastroenterology

## 2019-07-07 VITALS — BP 140/80 | Temp 98.5°F | Ht 66.0 in | Wt 177.0 lb

## 2019-07-07 DIAGNOSIS — K920 Hematemesis: Secondary | ICD-10-CM

## 2019-07-07 DIAGNOSIS — K625 Hemorrhage of anus and rectum: Secondary | ICD-10-CM | POA: Diagnosis not present

## 2019-07-07 MED ORDER — PLENVU 140 G PO SOLR: 1.0000 | ORAL | 0 refills | Status: DC

## 2019-07-08 ENCOUNTER — Other Ambulatory Visit: Payer: Self-pay

## 2019-07-08 NOTE — Patient Outreach (Signed)
[facility]) Care Management  07/08/2019  Sherri Burns 1936/06/24 [id number]   Successful outreach to patient regarding social work referral for transportation resources.  Patient reports upcoming surgery and a family member has agreed to assist with transportation for this.   BSW and patient discussed potential transportation resources if family is unable to help for future appointments.  BSW provided her with contact information to confirm whether or not she has transportation benefit through [facility]. BSW and patient discussed [employer], however, they have very limited volunteers available at this time due to [place]. BSW and patient also discussed [employer] services as well as application and assessment process.   Patient is going to contact [facility] to determine if she has transportation benefit and, if so, how many rides are available.  Patient was provided with my contact information and stated that she will call me back if she would like for a [employer] application to be submitted.  Closing case at this time but will reopen if she needs further assitance.   Ronn Melena, BSW Social Worker [phone]

## 2019-07-13 ENCOUNTER — Telehealth: Payer: Self-pay | Admitting: Gastroenterology

## 2019-07-13 NOTE — Telephone Encounter (Signed)

## 2019-07-13 NOTE — Telephone Encounter (Signed)
Pt's pharmacy called stating that insurance will not cover Plenvu.  Pt is scheduled for colonoscopy tomorrow.

## 2019-07-13 NOTE — Telephone Encounter (Signed)
Spoke with patient and informed her she can come an pick up a prep. She will try and get someone to come and pick up for her.

## 2019-07-14 ENCOUNTER — Other Ambulatory Visit: Payer: Self-pay

## 2019-07-14 ENCOUNTER — Encounter: Payer: Self-pay | Admitting: Gastroenterology

## 2019-07-14 ENCOUNTER — Ambulatory Visit (AMBULATORY_SURGERY_CENTER): Payer: Medicare Other | Admitting: Gastroenterology

## 2019-07-14 VITALS — BP 154/92 | HR 78 | Temp 98.2°F | Resp 15 | Ht 66.0 in | Wt 177.0 lb

## 2019-07-14 DIAGNOSIS — K317 Polyp of stomach and duodenum: Secondary | ICD-10-CM | POA: Diagnosis not present

## 2019-07-14 DIAGNOSIS — Z86010 Personal history of colonic polyps: Secondary | ICD-10-CM

## 2019-07-14 DIAGNOSIS — D123 Benign neoplasm of transverse colon: Secondary | ICD-10-CM

## 2019-07-14 DIAGNOSIS — K2970 Gastritis, unspecified, without bleeding: Secondary | ICD-10-CM | POA: Diagnosis not present

## 2019-07-14 DIAGNOSIS — K2951 Unspecified chronic gastritis with bleeding: Secondary | ICD-10-CM | POA: Diagnosis not present

## 2019-07-14 DIAGNOSIS — D120 Benign neoplasm of cecum: Secondary | ICD-10-CM

## 2019-07-14 DIAGNOSIS — K920 Hematemesis: Secondary | ICD-10-CM | POA: Diagnosis not present

## 2019-07-14 DIAGNOSIS — K625 Hemorrhage of anus and rectum: Secondary | ICD-10-CM

## 2019-07-14 DIAGNOSIS — K3189 Other diseases of stomach and duodenum: Secondary | ICD-10-CM

## 2019-07-14 DIAGNOSIS — D124 Benign neoplasm of descending colon: Secondary | ICD-10-CM | POA: Diagnosis not present

## 2019-07-14 MED ORDER — SODIUM CHLORIDE 0.9 % IV SOLN: 500.0000 mL | Freq: Once | INTRAVENOUS | Status: DC

## 2019-07-14 NOTE — Progress Notes (Signed)
Called the phone no. For the care partner given at admission, Kenney Houseman [phone].  It sounded like whoever answered the phone was at home (childrens voices in the background.  Person who answered said to please hold on.  It was unclear that whoever answered comprehended to come pick up the pt.  Transporter CB took the pt. Down, the ride was not there.  Brought pt. Back up to recovery.  Attempted to call the no. Again.  (The pt. Had not brought her cell phone so could not call).  At 2nd call, a child answered. They said "Tonya" doesn't live here.  The pt. Was put on the phone line to speak to whoever answered.  She told the children that she was their grandmother, and where was [place].  Transporter SB took pt. Down.  Kenney Houseman was there.  D/C time was delayed.  Pt. D/c'd from facility at 4:45.

## 2019-07-14 NOTE — Patient Instructions (Signed)
Impression/Recommendations:  Polyp handout given to patient. Hemorrhoid handout given to patient.  Resume previous diet. Continue current medications. Await pathology results.  YOU HAD AN ENDOSCOPIC PROCEDURE TODAY AT [facility]:   Refer to the procedure report that was given to you for any specific questions about what was found during the examination.  If the procedure report does not answer your questions, please call your gastroenterologist to clarify.  If you requested that your care partner not be given the details of your procedure findings, then the procedure report has been included in a sealed envelope for you to review at your convenience later.  YOU SHOULD EXPECT: Some feelings of bloating in the abdomen. Passage of more gas than usual.  Walking can help get rid of the air that was put into your GI tract during the procedure and reduce the bloating. If you had a lower endoscopy (such as a colonoscopy or flexible sigmoidoscopy) you may notice spotting of blood in your stool or on the toilet paper. If you underwent a bowel prep for your procedure, you may not have a normal bowel movement for a few days.  Please Note:  You might notice some irritation and congestion in your nose or some drainage.  This is from the oxygen used during your procedure.  There is no need for concern and it should clear up in a day or so.  SYMPTOMS TO REPORT IMMEDIATELY:   Following lower endoscopy (colonoscopy or flexible sigmoidoscopy):  Excessive amounts of blood in the stool  Significant tenderness or worsening of abdominal pains  Swelling of the abdomen that is new, acute  Fever of 100F or higher   Following upper endoscopy (EGD)  Vomiting of blood or coffee ground material  New chest pain or pain under the shoulder blades  Painful or persistently difficult swallowing  New shortness of breath  Fever of 100F or higher  Black, tarry-looking stools  For urgent or emergent  issues, a gastroenterologist can be reached at any hour by calling [phone].   DIET:  We do recommend a small meal at first, but then you may proceed to your regular diet.  Drink plenty of fluids but you should avoid alcoholic beverages for 24 hours.  ACTIVITY:  You should plan to take it easy for the rest of today and you should NOT DRIVE or use heavy machinery until tomorrow (because of the sedation medicines used during the test).    FOLLOW UP: Our staff will call the number listed on your records 48-72 hours following your procedure to check on you and address any questions or concerns that you may have regarding the information given to you following your procedure. If we do not reach you, we will leave a message.  We will attempt to reach you two times.  During this call, we will ask if you have developed any symptoms of COVID 19. If you develop any symptoms (ie: fever, flu-like symptoms, shortness of breath, cough etc.) before then, please call [phone].  If you test positive for Covid 19 in the 2 weeks post procedure, please call and report this information to [place].    If any biopsies were taken you will be contacted by phone or by letter within the next 1-3 weeks.  Please call us at [phone] if you have not heard about the biopsies in 3 weeks.    SIGNATURES/CONFIDENTIALITY: You and/or your care partner have signed paperwork which will be entered into your electronic medical  record.  These signatures attest to the fact that that the information above on your After Visit Summary has been reviewed and is understood.  Full responsibility of the confidentiality of this discharge information lies with you and/or your care-partner. 

## 2019-07-14 NOTE — Op Note (Signed)
[facility] Patient Name: Sherri Burns Procedure Date: 07/14/2019 2:54 PM MRN: [id number] Endoscopist: Thornton Park MD, MD Age: 83 Referring MD:  Date of Birth: 1936/10/20 Gender: Female Account #: [id number] Procedure:                Upper GI endoscopy Indications:              Hematemesis                           EGD for dysphagia 2016 ([facility])                           - emperic dilation performed at that time Medicines:                See the Anesthesia note for documentation of the                            administered medications Procedure:                Pre-Anesthesia Assessment:                           - Prior to the procedure, a History and Physical                            was performed, and patient medications and                            allergies were reviewed. The patient's tolerance of                            previous anesthesia was also reviewed. The risks                            and benefits of the procedure and the sedation                            options and risks were discussed with the patient.                            All questions were answered, and informed consent                            was obtained. Prior Anticoagulants: The patient has                            taken no previous anticoagulant or antiplatelet                            agents. ASA Grade Assessment: III - A patient with                            severe systemic disease. After reviewing the risks  and benefits, the patient was deemed in                            satisfactory condition to undergo the procedure.                           After obtaining informed consent, the endoscope was                            passed under direct vision. Throughout the                            procedure, the patient's blood pressure, pulse, and                            oxygen saturations were monitored continuously. The                Endoscope was introduced through the mouth, and                            advanced to the second part of duodenum. The upper                            GI endoscopy was accomplished without difficulty.                            The patient tolerated the procedure well. Scope In: Scope Out: Findings:                 The esophagus was normal.                           Diffuse nodular mucosa with associated erythema was                            found throughout the gastric body. This is of                            unclear clinical significance. Biopsies were taken                            with a cold forceps for histology. Estimated blood                            loss was minimal.                           A few small sessile polyps were found in the                            gastric body. These appeared to be fundic gland                            polyps. Biopsies were taken with a cold forceps for  histology. Estimated blood loss was minimal.                           The examined duodenum was normal. Complications:            No immediate complications. Estimated blood loss:                            Minimal. Estimated Blood Loss:     Estimated blood loss was minimal. Impression:               - Normal esophagus.                           - Nodular mucosa in the gastric body of unclear                            etiology. Biopsied.                           - A few gastric polyps. Biopsied.                           - Normal examined duodenum. Recommendation:           - Patient has a contact number available for                            emergencies. The signs and symptoms of potential                            delayed complications were discussed with the                            patient. Return to normal activities tomorrow.                            Written discharge instructions were provided to the                             patient.                           - Resume previous diet today.                           - Continue present medications.                           - Await pathology results. Thornton Park MD, MD 07/14/2019 3:50:57 PM This report has been signed electronically.

## 2019-07-14 NOTE — Progress Notes (Signed)
PT taken to PACU. Monitors in place. VSS. Report given to RN. 

## 2019-07-14 NOTE — Progress Notes (Signed)
Pt's states no medical or surgical changes since previsit or office visit. 

## 2019-07-14 NOTE — Op Note (Signed)
[place] Patient Name: Sherri Burns Procedure Date: 07/14/2019 2:53 PM MRN: [id number] Endoscopist: Thornton Park MD, MD Age: 83 Referring MD:  Date of Birth: [birth year] Gender: Female Account #: [id number] Procedure:                Colonoscopy Indications:              Surveillance: Personal history of adenomatous                            polyps on last colonoscopy > 3 years ago,                            Incidental - Rectal bleeding                           Intermittent, asymptomatic rectal bleeding                           History of colon polyps ([place])                           - 03/12/2011: 2 tubular adenomas on the pathology                            result.                           - 03/28/2015: one small polyp was seen.                            Diverticulosis. Surveillance recommended in 5 years.                           - 10/23/2016 for rectal bleeding: 4 small polyps,                            sigmoid diverticulosis, and internal hemorrhoids                            Internal hemorrhoids noted on prior colonoscopy for                            rectal bleeding with Dr. Allyn Kenner                           - surveillance colonoscopy recommended in 3 years Medicines:                See the Anesthesia note for documentation of the                            administered medications Procedure:                Pre-Anesthesia Assessment:                           - Prior to the procedure, a History and Physical  was performed, and patient medications and                            allergies were reviewed. The patient's tolerance of                            previous anesthesia was also reviewed. The risks                            and benefits of the procedure and the sedation                            options and risks were discussed with the patient.                            All questions were answered, and informed  consent                            was obtained. Prior Anticoagulants: The patient has                            taken no previous anticoagulant or antiplatelet                            agents. ASA Grade Assessment: III - A patient with                            severe systemic disease. After reviewing the risks                            and benefits, the patient was deemed in                            satisfactory condition to undergo the procedure.                           After obtaining informed consent, the colonoscope                            was passed under direct vision. Throughout the                            procedure, the patient's blood pressure, pulse, and                            oxygen saturations were monitored continuously. The                            Colonoscope was introduced through the anus and                            advanced to the the terminal ileum, with  identification of the appendiceal orifice and IC                            valve. A second forward view of the right colon was                            performed. The colonoscopy was performed without                            difficulty. The patient tolerated the procedure                            well. The quality of the bowel preparation was                            good. The terminal ileum, ileocecal valve,                            appendiceal orifice, and rectum were photographed. Scope In: 3:22:30 PM Scope Out: 3:41:41 PM Scope Withdrawal Time: 0 hours 16 minutes 41 seconds  Total Procedure Duration: 0 hours 19 minutes 11 seconds  Findings:                 A 2 mm polyp was found in the ileocecal valve. The                            polyp was sessile. The polyp was removed with a                            cold snare. Resection and retrieval were complete.                            Estimated blood loss was minimal.                           Three  sessile polyps were found in the hepatic                            flexure. The polyps were 1 to 2 mm in size. These                            polyps were removed with a cold snare. Resection                            and retrieval were complete. Estimated blood loss                            was minimal.                           Three sessile polyps were found in the distal  transverse colon. The polyps were 1 to 2 mm in                            size. These polyps were removed with a cold biopsy                            forceps. Resection and retrieval were complete.                            Estimated blood loss was minimal.                           Three sessile polyps were found in the descending                            colon. The polyps were 1 to 3 mm in size. These                            polyps were removed with a cold snare. Resection                            and retrieval were complete. Estimated blood loss                            was minimal.                           Non-bleeding internal hemorrhoids were found.                           The exam was otherwise without abnormality on                            direct and retroflexion views. Complications:            No immediate complications. Estimated blood loss:                            Minimal. Estimated Blood Loss:     Estimated blood loss was minimal. Impression:               - One 2 mm polyp at the ileocecal valve, removed                            with a cold snare. Resected and retrieved.                           - Three 1 to 2 mm polyps at the hepatic flexure,                            removed with a cold snare. Resected and retrieved.                           - Three 1 to 2 mm polyps in the  distal transverse                            colon, removed with a cold biopsy forceps. Resected                            and retrieved.                           - Three 1  to 3 mm polyps in the descending colon,                            removed with a cold snare. Resected and retrieved.                           - Non-bleeding internal hemorrhoids.                           - The examination was otherwise normal on direct                            and retroflexion views. Recommendation:           - Written discharge instructions were provided to                            the patient.                           - Resume previous diet today.                           - Continue current medications.                           - Await pathology results. Thornton Park MD, MD 07/14/2019 3:57:09 PM This report has been signed electronically.

## 2019-07-14 NOTE — Progress Notes (Signed)
Called to room to assist during endoscopic procedure.  Patient ID and intended procedure confirmed with present staff. Received instructions for my participation in the procedure from the performing physician.  

## 2019-07-16 ENCOUNTER — Telehealth: Payer: Self-pay | Admitting: *Deleted

## 2019-07-16 ENCOUNTER — Telehealth: Payer: Self-pay

## 2019-07-16 NOTE — Telephone Encounter (Signed)
  Follow up Call-  Call back number 07/14/2019  Post procedure Call Back phone  # [phone]  Permission to leave phone message Yes  Some recent data might be hidden     Patient questions:  Do you have a fever, pain , or abdominal swelling? No. Pain Score  0 *  Have you tolerated food without any problems? Yes.    Have you been able to return to your normal activities? Yes.    Do you have any questions about your discharge instructions: Diet   No. Medications  No. Follow up visit  No.  Do you have questions or concerns about your Care? No.  Actions: * If pain score is 4 or above: No action needed, pain <4.  1. Have you developed a fever since your procedure? no  2.   Have you had an respiratory symptoms (SOB or cough) since your procedure? no  3.   Have you tested positive for COVID 19 since your procedure no  4.   Have you had any family members/close contacts diagnosed with the COVID 19 since your procedure?  no   If yes to any of these questions please route to Joylene John, RN and Alphonsa Gin, Therapist, sports.

## 2019-07-16 NOTE — Telephone Encounter (Signed)
Left message on answering machine. 

## 2019-07-18 ENCOUNTER — Other Ambulatory Visit: Payer: Self-pay | Admitting: Family Medicine

## 2019-07-20 ENCOUNTER — Other Ambulatory Visit: Payer: Self-pay | Admitting: Family Medicine

## 2019-07-20 NOTE — Telephone Encounter (Signed)
Requested medication (s) are due for refill today: yes  Requested medication (s) are on the active medication list: yes  Last refill:  04/15/2019  Future visit scheduled: yes  Notes to clinic:  The original prescription was discontinued on 05/06/2019 by Rutherford Guys, MD. Renewing this prescription may not be appropriate.   Requested Prescriptions  Pending Prescriptions Disp Refills   polyethylene glycol powder (GLYCOLAX/MIRALAX) 17 GM/SCOOP powder [Pharmacy Med Name: POLYETHYLENE GLYCOL 17G PWD] 255 g     Sig: TAKE 17 GRAMS BY MOUTH EVERY MORNING     Gastroenterology:  Laxatives Passed - 07/20/2019 10:38 AM      Passed - Valid encounter within last 12 months    Recent Outpatient Visits          2 months ago Type 2 diabetes mellitus with diabetic polyneuropathy, without long-term current use of insulin ([place])   [facility], Lilia Argue, MD   5 months ago Allergic reaction, initial encounter   [facility], Ranell Patrick, MD   5 months ago Type 2 diabetes mellitus with diabetic polyneuropathy, without long-term current use of insulin [facility])   [facility], Lilia Argue, MD   10 months ago Lower abdominal pain   [facility], Lilia Argue, MD   1 year ago Lower abdominal pain   [facility], Lilia Argue, MD      Future Appointments            In 2 weeks Rutherford Guys, MD Primary Care at [place], [facility]

## 2019-07-23 ENCOUNTER — Other Ambulatory Visit: Payer: Self-pay | Admitting: *Deleted

## 2019-07-23 ENCOUNTER — Encounter: Payer: Self-pay | Admitting: *Deleted

## 2019-07-23 DIAGNOSIS — K219 Gastro-esophageal reflux disease without esophagitis: Secondary | ICD-10-CM

## 2019-07-23 MED ORDER — OMEPRAZOLE 40 MG PO CPDR: 40.0000 mg | DELAYED_RELEASE_CAPSULE | Freq: Two times a day (BID) | ORAL | 0 refills | Status: DC

## 2019-07-26 ENCOUNTER — Telehealth: Payer: Self-pay | Admitting: Gastroenterology

## 2019-07-26 ENCOUNTER — Encounter (HOSPITAL_COMMUNITY): Payer: Self-pay | Admitting: *Deleted

## 2019-07-26 ENCOUNTER — Emergency Department (HOSPITAL_COMMUNITY)
Admission: EM | Admit: 2019-07-26 | Discharge: 2019-07-27 | Disposition: A | Discharge status: Home / Self Care | Payer: Medicare Other | Attending: Emergency Medicine | Admitting: Emergency Medicine

## 2019-07-26 ENCOUNTER — Other Ambulatory Visit: Payer: Self-pay

## 2019-07-26 DIAGNOSIS — E1122 Type 2 diabetes mellitus with diabetic chronic kidney disease: Secondary | ICD-10-CM | POA: Diagnosis not present

## 2019-07-26 DIAGNOSIS — R221 Localized swelling, mass and lump, neck: Secondary | ICD-10-CM | POA: Diagnosis not present

## 2019-07-26 DIAGNOSIS — N183 Chronic kidney disease, stage 3 (moderate): Secondary | ICD-10-CM | POA: Diagnosis not present

## 2019-07-26 DIAGNOSIS — E039 Hypothyroidism, unspecified: Secondary | ICD-10-CM | POA: Diagnosis not present

## 2019-07-26 DIAGNOSIS — M542 Cervicalgia: Secondary | ICD-10-CM | POA: Diagnosis not present

## 2019-07-26 DIAGNOSIS — Z905 Acquired absence of kidney: Secondary | ICD-10-CM | POA: Diagnosis not present

## 2019-07-26 DIAGNOSIS — Z791 Long term (current) use of non-steroidal anti-inflammatories (NSAID): Secondary | ICD-10-CM | POA: Diagnosis not present

## 2019-07-26 DIAGNOSIS — I130 Hypertensive heart and chronic kidney disease with heart failure and stage 1 through stage 4 chronic kidney disease, or unspecified chronic kidney disease: Secondary | ICD-10-CM | POA: Insufficient documentation

## 2019-07-26 DIAGNOSIS — M79601 Pain in right arm: Secondary | ICD-10-CM | POA: Diagnosis present

## 2019-07-26 DIAGNOSIS — F17210 Nicotine dependence, cigarettes, uncomplicated: Secondary | ICD-10-CM | POA: Insufficient documentation

## 2019-07-26 DIAGNOSIS — Z7984 Long term (current) use of oral hypoglycemic drugs: Secondary | ICD-10-CM | POA: Diagnosis not present

## 2019-07-26 DIAGNOSIS — R0789 Other chest pain: Secondary | ICD-10-CM | POA: Insufficient documentation

## 2019-07-26 DIAGNOSIS — M25511 Pain in right shoulder: Secondary | ICD-10-CM | POA: Diagnosis not present

## 2019-07-26 DIAGNOSIS — M898X1 Other specified disorders of bone, shoulder: Secondary | ICD-10-CM

## 2019-07-26 DIAGNOSIS — I5032 Chronic diastolic (congestive) heart failure: Secondary | ICD-10-CM | POA: Diagnosis not present

## 2019-07-26 DIAGNOSIS — R51 Headache: Secondary | ICD-10-CM | POA: Diagnosis not present

## 2019-07-26 DIAGNOSIS — E1142 Type 2 diabetes mellitus with diabetic polyneuropathy: Secondary | ICD-10-CM | POA: Diagnosis not present

## 2019-07-26 DIAGNOSIS — J9811 Atelectasis: Secondary | ICD-10-CM | POA: Diagnosis not present

## 2019-07-26 NOTE — Telephone Encounter (Signed)
Received page to the on-call. Pt c/o right sided neck pain and swelling, extending from collar bone to right ear. +sore throat and TTP. No fever. No n/v. Acute onset earlier this evening.   Did have an EGD and Colonoscopy on 8/18. I reviewed those notes and images. Normal esophagus, gastritis, normal duodenum. No esophageal biopsies or dilation performed.   Given age, degree of described pain, and neck swelling, recommended that she head to the ER for further evaluation and examination with appropriate tx. Do not think this is related to her recent EGD. She confirms that she is able to have transport via granddaughter. All questions answered and she was grateful for phone call.

## 2019-07-26 NOTE — ED Triage Notes (Signed)
Pt reports right collar bone pain (and swelling), denies injury to the same. Pain radiates to the right arm. Also having some itching in the right ear. Reports that she called her  doctor (had polyps removed last week and they recommended coming in for evaluation.

## 2019-07-27 ENCOUNTER — Other Ambulatory Visit: Payer: Self-pay | Admitting: Family Medicine

## 2019-07-27 ENCOUNTER — Emergency Department (HOSPITAL_COMMUNITY): Payer: Medicare Other

## 2019-07-27 DIAGNOSIS — J9811 Atelectasis: Secondary | ICD-10-CM | POA: Diagnosis not present

## 2019-07-27 DIAGNOSIS — R221 Localized swelling, mass and lump, neck: Secondary | ICD-10-CM | POA: Diagnosis not present

## 2019-07-27 MED ORDER — HYDROCODONE-ACETAMINOPHEN 5-325 MG PO TABS
1.0000 | ORAL_TABLET | Freq: Once | ORAL | Status: AC
  Administered 2019-07-27: 05:00:00 1 via ORAL
  Filled 2019-07-27: qty 1

## 2019-07-27 NOTE — Telephone Encounter (Signed)
Thank you for letting me know

## 2019-07-27 NOTE — ED Provider Notes (Signed)
[facility] EMERGENCY DEPARTMENT Provider Note   CSN: [id number] Arrival date & time: 07/26/19  2250     History   Chief Complaint Chief Complaint  Patient presents with  . Arm Pain    HPI Sherri Burns is a 83 y.o. female.     The history is provided by the patient.    Patient presents for multiple complaints. Patient with history of anxiety, diabetes, GERD presents with right-sided neck pain/clavicle pain No fevers or vomiting.  There is some pain with swallowing.  No chest pain or shortness of breath.  She has also had intermittent headaches.  She reports his symptoms seem worse since recent upper endoscopy and colonoscopy.   She mentioned to nursing that her ears were itching and her right arm hurt, but she did not report those on my evaluation.  She did call her gastroenterologist who advised evaluation  Past Medical History:  Diagnosis Date  . Abnormal EKG 02/07/2016   Inferolateral T wave inversion and ST depression.  . Acute calculous cholecystitis   . Anxiety   . Arthritis   . Chronic diastolic CHF (congestive heart failure) ([place]) 02/21/2018  . Colon polyps 11/2008   tubular adenoma  . Diabetes mellitus   . Esophageal problem    esophageal dilation  . GERD (gastroesophageal reflux disease)    + hpylori  . GI bleeding 01/2018  . Headache   . Hyperlipidemia   . Hypertension   . Hypothyroidism   . Ischemic colitis ([place])   . Renal cancer, right ([place]) 2010   s/p Rt nephrectomy by Dr. Rosana Hoes  . Renal insufficiency     Patient Active Problem List   Diagnosis Date Noted  . GI bleed 04/10/2019  . S/p nephrectomy 06/24/2018  . Chronic diastolic CHF (congestive heart failure) ([place]) 02/21/2018  . Acute lower GI bleeding 02/20/2018  . Hyperlipidemia   . Abnormal EKG 02/07/2016  . Type 2 diabetes mellitus with diabetic polyneuropathy, without long-term current use of insulin ([place]) 09/23/2012  . Essential hypertension   . Hypothyroidism    . CKD (chronic kidney disease), stage III [address])     Past Surgical History:  Procedure Laterality Date  . ABDOMINAL HYSTERECTOMY  1978   partial  . CHOLECYSTECTOMY N/A 04/04/2016   Procedure: LAPAROSCOPIC CHOLECYSTECTOMY;  Surgeon: Autumn Messing III, MD;  Location: [facility];  Service: General;  Laterality: N/A;  . COLONOSCOPY    . ESOPHAGEAL DILATION  2016  . HERNIA REPAIR    . LAPAROSCOPIC CHOLECYSTECTOMY  04/04/2016  . NEPHRECTOMY Right 2010   Dr. Rosana Hoes, urology, due to renal cancer     OB History   No obstetric history on file.      Home Medications    Prior to Admission medications   Medication Sig Start Date End Date Taking? Authorizing Provider  acetaminophen (TYLENOL) 500 MG tablet Take 500 mg by mouth every 6 (six) hours as needed for moderate pain.   Yes [provider]  amLODipine (NORVASC) 10 MG tablet Take 1 tablet (10 mg total) by mouth daily. 01/30/19  Yes Rutherford Guys, MD  cholecalciferol (VITAMIN D) 1000 units tablet Take 1,000 Units by mouth daily.   Yes [provider]  clonazePAM (KLONOPIN) 1 MG tablet TAKE 1/2 TABLET BY MOUTH EVERY MORNING AND TAKE ONE TABLET BY MOUTH AT BEDTIME Patient taking differently: Take 0.5 mg by mouth 2 (two) times daily. TAKE 1/2 TABLET BY MOUTH EVERY MORNING AND TAKE ONE TABLET BY MOUTH AT  BEDTIME 01/30/19  Yes Rutherford Guys, MD  Colloidal Oatmeal (ECZEMA MOISTURIZING) 1 % LOTN Apply 1 application topically as needed. Eczema cream   Yes [provider]  fluticasone (FLONASE) 50 MCG/ACT nasal spray Place 1-2 sprays into both nostrils at bedtime. Patient taking differently: Place 1 spray into both nostrils daily as needed for allergies.  01/30/19  Yes Rutherford Guys, MD  furosemide (LASIX) 40 MG tablet TAKE ONE TABLET BY MOUTH DAILY Patient taking differently: Take 40 mg by mouth daily.  04/30/19  Yes Rutherford Guys, MD  glipiZIDE (GLUCOTROL) 10 MG tablet TAKE TWO TABLETS BY MOUTH TWICE A DAY 30 MINUTES BEFORE  MEALS Patient taking differently: Take 20 mg by mouth 2 (two) times daily.  07/19/19  Yes Rutherford Guys, MD  hydroxypropyl methylcellulose / hypromellose (ISOPTO TEARS / GONIOVISC) 2.5 % ophthalmic solution Place 2 drops into both eyes 3 (three) times daily as needed for dry eyes.   Yes [provider]  levothyroxine (SYNTHROID) 88 MCG tablet TAKE ONE TABLET BY MOUTH DAILY BEFORE BREAKFAST Patient taking differently: Take 88 mcg by mouth daily before breakfast.  03/18/19  Yes Rutherford Guys, MD  metoprolol tartrate (LOPRESSOR) 100 MG tablet TAKE ONE TABLET BY MOUTH TWICE A DAY Patient taking differently: Take 100 mg by mouth 2 (two) times daily.  05/01/19  Yes Rutherford Guys, MD  omeprazole (PRILOSEC) 40 MG capsule Take 1 capsule (40 mg total) by mouth 2 (two) times daily. 07/23/19 09/21/19 Yes Thornton Park, MD  polyethylene glycol (MIRALAX / GLYCOLAX) 17 g packet Take 17 g by mouth daily as needed for mild constipation.    Yes [provider]  vitamin E 100 UNIT capsule Take 100 Units by mouth daily.   Yes [provider]  hydrocortisone (ANUSOL-HC) 25 MG suppository Place 1 suppository (25 mg total) rectally 2 (two) times daily. Patient not taking: Reported on 07/27/2019 05/06/19   Rutherford Guys, MD    Family History Family History  Problem Relation Age of Onset  . Cancer Mother        colon, kidney cancer  . Cancer Father        throat cancer  . Cancer Sister   . Heart attack Brother     Social History Social History   Tobacco Use  . Smoking status: Current Some Day Smoker    Packs/day: 0.25    Years: 55.00    Pack years: 13.75    Types: Cigarettes  . Smokeless tobacco: Never Used  . Tobacco comment: cut back amount still  trying to quit  Substance Use Topics  . Alcohol use: No    Alcohol/week: 0.0 standard drinks  . Drug use: No     Allergies   Ace inhibitors, Codeine, Metformin and related, Ciprocin-fluocin-procin [fluocinolone  acetonide], Clonidine derivatives, Crestor [rosuvastatin calcium], Penicillins, Simvastatin, and Tessalon perles   Review of Systems Review of Systems  Constitutional: Negative for fever.  Gastrointestinal: Negative for vomiting.  Musculoskeletal: Positive for arthralgias.  All other systems reviewed and are negative.    Physical Exam Updated Vital Signs BP (!) 156/95   Pulse 80   Temp 98.9 F (37.2 C) (Oral)   Resp 16   SpO2 95%   Physical Exam CONSTITUTIONAL: Well developed/well nourished HEAD: Normocephalic/atraumatic EYES: EOMI/PERRL ENMT: Mucous membranes moist, uvula midline, no stridor, no drooling, no dysphonia NECK: supple no meningeal signs, mild tenderness to right anterior neck, no bruits, no hematoma, no thrills SPINE/BACK:entire spine nontender CV:  S1/S2 noted, no murmurs/rubs/gallops noted LUNGS: Lungs are clear to auscultation bilaterally, no apparent distress Chest-mild prominence of right clavicle, no erythema, no crepitus, no bruising.  Mild tenderness noted. ABDOMEN: soft, nontender, no rebound or guarding, bowel sounds noted throughout abdomen GU:no cva tenderness NEURO: Pt is awake/alert/appropriate, moves all extremitiesx4.  No facial droop.   EXTREMITIES: pulses normal/equal, full ROM, moves all extremities without difficulty SKIN: warm, color normal PSYCH: Anxious   ED Treatments / Results  Labs (all labs ordered are listed, but only abnormal results are displayed) Labs Reviewed - No data to display  EKG None  Radiology Dg Neck Soft Tissue  Result Date: 07/27/2019 CLINICAL DATA:  Soft tissue swelling over the right clavicle. Patient had recent procedure over the right clavicle. EXAM: NECK SOFT TISSUES - 1+ VIEW COMPARISON:  None. FINDINGS: This study is a soft tissue neck study looking at the airway. Airway is patent. Left carotid vascular calcifications are present. Soft tissues the neck are otherwise unremarkable. Lung apices are clear.  Visualized clavicles are normal. Advanced degenerative changes are noted in the cervical spine, most evident at C5-6. IMPRESSION: 1. Unremarkable soft tissue neck study. 2. The study was not dedicated for evaluation of the right clavicle. 3. Advanced degenerative changes of the cervical spine. Electronically Signed   By: San Morelle M.D.   On: 07/27/2019 05:19   Dg Chest 2 View  Result Date: 07/27/2019 CLINICAL DATA:  Right collarbone pain and swelling. Recent operative procedure. EXAM: CHEST - 2 VIEW COMPARISON:  Two-view chest x-ray 06/22/2019 FINDINGS: The heart size is normal. Atherosclerotic calcifications are present at the aortic arch. Mild bibasilar atelectasis is present. Lungs are otherwise clear. Degenerative changes are noted at the [facility] joints bilaterally. Clavicle is otherwise unremarkable. Degenerative changes are noted in the cervical spine. IMPRESSION: 1. Mild bibasilar atelectasis. 2. Chronic degenerative changes at the [facility] joints bilaterally. Electronically Signed   By: San Morelle M.D.   On: 07/27/2019 05:20    Procedures Procedures  Medications Ordered in ED Medications  HYDROcodone-acetaminophen (NORCO/VICODIN) 5-325 MG per tablet 1 tablet (1 tablet Oral Given 07/27/19 0450)     Initial Impression / Assessment and Plan / ED Course  I have reviewed the triage vital signs and the nursing notes.  Pertinent  imaging results that were available during my care of the patient were reviewed by me and considered in my medical decision making (see chart for details).        Patient presents for multiple complaints, but her main issue appears to be right-sided neck and clavicle pain.  She reports this all seems related to recent EGD. She did have an EGD and Colonoscopy on 8/18. Normal esophagus, gastritis, normal duodenum. No esophageal biopsies or dilation performed.  I reviewed those results.  Given location of pain, will obtain soft tissue neck and chest x-ray.   Will give pain medicine.  Patient otherwise well-appearing. 6:31 AM Patient improved.  No acute distress.  X-rays reviewed and negative. No supraclavicular lymphadenopathy.  No tenderness noted to palpation of either clavicle. I Feel she is appropriate for discharge home.  Patient is agreeable.  She has follow-up later in September and feels comfortable for this She does not want me to call her family  Final Clinical Impressions(s) / ED Diagnoses   Final diagnoses:  Neck pain  Clavicle pain    ED Discharge Orders    None       Ripley Fraise, MD 07/27/19 [phone]

## 2019-07-27 NOTE — Telephone Encounter (Signed)
I wanted to make you aware that our mutual patient was seen in the  ED last night/early this morning with neck pain. She has follow-up with me, but, I was afraid non-GI etiologies should be considered prior to that time. Thank you.

## 2019-07-27 NOTE — Discharge Instructions (Addendum)
° °  SEEK IMMEDIATE MEDICAL ATTENTION IF: You develop difficulties swallowing or breathing.  You have new or worse numbness, weakness, tingling, or movement problems in your arms or legs.  You develop increasing pain which is uncontrolled with medications.  You have change in bowel or bladder function, or other concerns.  

## 2019-07-28 ENCOUNTER — Other Ambulatory Visit: Payer: Self-pay | Admitting: Family Medicine

## 2019-07-28 NOTE — Telephone Encounter (Signed)
Requested medication (s) are due for refill today: yes   Requested medication (s) are on the active medication list: yes  Last refill:    Future visit scheduled:yes  Notes to clinic:  The original prescription was discontinued on 05/06/2019 by Rutherford Guys, MD. Renewing this prescription may not be appropriate   Requested Prescriptions  Pending Prescriptions Disp Refills   polyethylene glycol powder (GLYCOLAX/MIRALAX) 17 GM/SCOOP powder [Pharmacy Med Name: POLYETHYLENE GLYCOL 17G PWD] 255 g     Sig: [place]     Gastroenterology:  Laxatives Passed - 07/28/2019 10:13 AM      Passed - Valid encounter within last 12 months    Recent Outpatient Visits          2 months ago Type 2 diabetes mellitus with diabetic polyneuropathy, without long-term current use of insulin ([place])   [facility], Lilia Argue, MD   5 months ago Allergic reaction, initial encounter   [facility], Ranell Patrick, MD   5 months ago Type 2 diabetes mellitus with diabetic polyneuropathy, without long-term current use of insulin [facility])   [facility], Lilia Argue, MD   11 months ago Lower abdominal pain   [facility], Lilia Argue, MD   1 year ago Lower abdominal pain   [facility], Lilia Argue, MD      Future Appointments            In 1 week Rutherford Guys, MD Primary Care at [place], [facility]

## 2019-08-07 ENCOUNTER — Other Ambulatory Visit: Payer: Self-pay

## 2019-08-07 ENCOUNTER — Encounter: Payer: Self-pay | Admitting: Family Medicine

## 2019-08-07 ENCOUNTER — Ambulatory Visit (INDEPENDENT_AMBULATORY_CARE_PROVIDER_SITE_OTHER): Payer: Medicare Other | Admitting: Family Medicine

## 2019-08-07 VITALS — BP 132/77 | HR 71 | Temp 98.7°F | Ht 66.0 in | Wt 171.0 lb

## 2019-08-07 DIAGNOSIS — E039 Hypothyroidism, unspecified: Secondary | ICD-10-CM | POA: Diagnosis not present

## 2019-08-07 DIAGNOSIS — I10 Essential (primary) hypertension: Secondary | ICD-10-CM | POA: Diagnosis not present

## 2019-08-07 DIAGNOSIS — Z79899 Other long term (current) drug therapy: Secondary | ICD-10-CM | POA: Insufficient documentation

## 2019-08-07 DIAGNOSIS — Z23 Encounter for immunization: Secondary | ICD-10-CM | POA: Diagnosis not present

## 2019-08-07 DIAGNOSIS — E1142 Type 2 diabetes mellitus with diabetic polyneuropathy: Secondary | ICD-10-CM

## 2019-08-07 DIAGNOSIS — I5032 Chronic diastolic (congestive) heart failure: Secondary | ICD-10-CM | POA: Diagnosis not present

## 2019-08-07 DIAGNOSIS — F411 Generalized anxiety disorder: Secondary | ICD-10-CM | POA: Insufficient documentation

## 2019-08-07 MED ORDER — METOPROLOL TARTRATE 100 MG PO TABS: 100.0000 mg | ORAL_TABLET | Freq: Two times a day (BID) | ORAL | 1 refills | Status: DC

## 2019-08-07 MED ORDER — CLONAZEPAM 1 MG PO TABS: ORAL_TABLET | ORAL | 5 refills | Status: DC

## 2019-08-07 MED ORDER — POLYETHYLENE GLYCOL 3350 17 G PO PACK: 17.0000 g | PACK | Freq: Every day | ORAL | 5 refills | Status: DC | PRN

## 2019-08-07 MED ORDER — FUROSEMIDE 40 MG PO TABS: 40.0000 mg | ORAL_TABLET | Freq: Every day | ORAL | 0 refills | Status: DC

## 2019-08-07 NOTE — Progress Notes (Signed)
9/11/20202:24 PM  Sherri Burns [birth year], 83 y.o., female [id number]  Chief Complaint  Patient presents with  . Follow-up    htn, thyroid, dm and gi appt     HPI:   Patient is a 83 y.o. female with past medical history significant for  DM2, CKD3, s/p nephrectomy, HTN, anxietyon long term bzd, hypothyroidismwho presents today for routine followup  Last OV June 2020 Referred to GI for rectal bleeding EGD/Colonscopy: gastritis, increased PPI 40mg  BID x 8 weeks, multiple precancerous polyps. No further colonoscopy needed.   Seen in ER for anterior neck pain/muscle spasms, xrays DDD Does not check cbgs at home Denies any hypoglycemia sx Sees renal, last OV July 2020 Had eye exam in march 2020, needed to come back for cataract eval, notes reviewed Planning to see gyn for intermittent vaginal pain with bulging in suprapubic area pmp reviewed Taking all meds as rx, denies any side effects  Lab Results  Component Value Date   HGBA1C 7.1 (H) 05/06/2019   HGBA1C 7.4 (A) 01/30/2019   HGBA1C 7.4 (H) 08/25/2018   Lab Results  Component Value Date   MICROALBUR 0.7 10/02/2016   LDLCALC 144 (H) 01/30/2019   CREATININE 1.42 (H) 06/22/2019   Lab Results  Component Value Date   TSH 0.947 01/30/2019    Depression screen PHQ 2/9 08/07/2019 07/03/2019 07/02/2019  Decreased Interest 0 0 0  Down, Depressed, Hopeless - 0 0  PHQ - 2 Score 0 0 0  Altered sleeping - - -  Tired, decreased energy - - -  Change in appetite - - -  Feeling bad or failure about yourself  - - -  Trouble concentrating - - -  Moving slowly or fidgety/restless - - -  Suicidal thoughts - - -  PHQ-9 Score - - -  Difficult doing work/chores - - -  Some recent data might be hidden    Fall Risk  08/07/2019 07/02/2019 05/06/2019 02/16/2019 01/30/2019  Falls in the past year? 0 0 0 0 0  Number falls in past yr: 0 0 0 0 0  Injury with Fall? 0 0 0 0 0  Follow up - - - Falls evaluation completed -     Allergies   Allergen Reactions  . Ace Inhibitors Swelling    See 02/16/19   . Codeine Other (See Comments)    hallucinations  . Metformin And Related     Upset stomach  . Ciprocin-Fluocin-Procin [Fluocinolone Acetonide] Other (See Comments)    Unknown reaction  . Clonidine Derivatives Other (See Comments)    Dry mouth  . Crestor [Rosuvastatin Calcium] Other (See Comments)    cramps  . Penicillins Other (See Comments)    Has patient had a PCN reaction causing immediate rash, facial/tongue/throat swelling, SOB or lightheadedness with hypotension: no Has patient had a PCN reaction causing severe rash involving mucus membranes or skin necrosis: no Has patient had a PCN reaction that required hospitalization : no Has patient had a PCN reaction occurring within the last 10 years: no -Hallucinates If all of the above answers are "NO", then may proceed with Cephalosporin use.   . Simvastatin Other (See Comments)    Upset stomach   . Tessalon Perles Other (See Comments)    Gi upset     Prior to Admission medications   Medication Sig Start Date End Date Taking? Authorizing Provider  acetaminophen (TYLENOL) 500 MG tablet Take 500 mg by mouth every 6 (six) hours as needed for moderate pain.  Yes [provider]  amLODipine (NORVASC) 10 MG tablet Take 1 tablet (10 mg total) by mouth daily. 01/30/19  Yes Rutherford Guys, MD  cholecalciferol (VITAMIN D) 1000 units tablet Take 1,000 Units by mouth daily.   Yes [provider]  clonazePAM (KLONOPIN) 1 MG tablet TAKE 1/2 TABLET BY MOUTH EVERY MORNING AND TAKE ONE TABLET BY MOUTH AT BEDTIME Patient taking differently: Take 0.5 mg by mouth 2 (two) times daily. TAKE 1/2 TABLET BY MOUTH EVERY MORNING AND TAKE ONE TABLET BY MOUTH AT BEDTIME 01/30/19  Yes Rutherford Guys, MD  Colloidal Oatmeal (ECZEMA MOISTURIZING) 1 % LOTN Apply 1 application topically as needed. Eczema cream   Yes [provider]  fluticasone (FLONASE) 50 MCG/ACT nasal  spray Place 1-2 sprays into both nostrils at bedtime. Patient taking differently: Place 1 spray into both nostrils daily as needed for allergies.  01/30/19  Yes Rutherford Guys, MD  furosemide (LASIX) 40 MG tablet TAKE ONE TABLET BY MOUTH DAILY Patient taking differently: Take 40 mg by mouth daily.  04/30/19  Yes Rutherford Guys, MD  glipiZIDE (GLUCOTROL) 10 MG tablet TAKE TWO TABLETS BY MOUTH TWICE A DAY 30 MINUTES BEFORE MEALS Patient taking differently: Take 20 mg by mouth 2 (two) times daily.  07/19/19  Yes Rutherford Guys, MD  hydrocortisone (ANUSOL-HC) 25 MG suppository Place 1 suppository (25 mg total) rectally 2 (two) times daily. 05/06/19  Yes Rutherford Guys, MD  hydroxypropyl methylcellulose / hypromellose (ISOPTO TEARS / GONIOVISC) 2.5 % ophthalmic solution Place 2 drops into both eyes 3 (three) times daily as needed for dry eyes.   Yes [provider]  levothyroxine (SYNTHROID) 88 MCG tablet TAKE ONE TABLET BY MOUTH DAILY BEFORE BREAKFAST Patient taking differently: Take 88 mcg by mouth daily before breakfast.  03/18/19  Yes Rutherford Guys, MD  metoprolol tartrate (LOPRESSOR) 100 MG tablet TAKE ONE TABLET BY MOUTH TWICE A DAY Patient taking differently: Take 100 mg by mouth 2 (two) times daily.  05/01/19  Yes Rutherford Guys, MD  omeprazole (PRILOSEC) 40 MG capsule Take 1 capsule (40 mg total) by mouth 2 (two) times daily. 07/23/19 09/21/19 Yes Thornton Park, MD  polyethylene glycol (MIRALAX / GLYCOLAX) 17 g packet Take 17 g by mouth daily as needed for mild constipation.    Yes [provider]  vitamin E 100 UNIT capsule Take 100 Units by mouth daily.   Yes [provider]    Past Medical History:  Diagnosis Date  . Abnormal EKG 02/07/2016   Inferolateral T wave inversion and ST depression.  . Acute calculous cholecystitis   . Anxiety   . Arthritis   . Chronic diastolic CHF (congestive heart failure) ([place]) 02/21/2018  . Colon polyps 11/2008   tubular  adenoma  . Diabetes mellitus   . Esophageal problem    esophageal dilation  . GERD (gastroesophageal reflux disease)    + hpylori  . GI bleeding 01/2018  . Headache   . Hyperlipidemia   . Hypertension   . Hypothyroidism   . Ischemic colitis ([place])   . Renal cancer, right ([place]) 2010   s/p Rt nephrectomy by Dr. Rosana Hoes  . Renal insufficiency     Past Surgical History:  Procedure Laterality Date  . ABDOMINAL HYSTERECTOMY  1978   partial  . CHOLECYSTECTOMY N/A 04/04/2016   Procedure: LAPAROSCOPIC CHOLECYSTECTOMY;  Surgeon: Autumn Messing III, MD;  Location: [facility];  Service: General;  Laterality: N/A;  . COLONOSCOPY    .  ESOPHAGEAL DILATION  2016  . HERNIA REPAIR    . LAPAROSCOPIC CHOLECYSTECTOMY  04/04/2016  . NEPHRECTOMY Right 2010   Dr. Rosana Hoes, urology, due to renal cancer    Social History   Tobacco Use  . Smoking status: Current Some Day Smoker    Packs/day: 0.25    Years: 55.00    Pack years: 13.75    Types: Cigarettes  . Smokeless tobacco: Never Used  . Tobacco comment: cut back amount still  trying to quit  Substance Use Topics  . Alcohol use: No    Alcohol/week: 0.0 standard drinks    Family History  Problem Relation Age of Onset  . Cancer Mother        colon, kidney cancer  . Cancer Father        throat cancer  . Cancer Sister   . Heart attack Brother     Review of Systems  Constitutional: Negative for chills, fever and malaise/fatigue.  Respiratory: Negative for cough and shortness of breath.   Cardiovascular: Negative for chest pain, palpitations, orthopnea, leg swelling and PND.  Gastrointestinal: Negative for abdominal pain, blood in stool, constipation, diarrhea, melena, nausea and vomiting.  Genitourinary: Negative for frequency and urgency.  Endo/Heme/Allergies: Negative for polydipsia.     OBJECTIVE:  Today's Vitals   08/07/19 1419  BP: 132/77  Pulse: 71  Temp: 98.7 F (37.1 C)  SpO2: 98%  Weight: 171 lb (77.6 kg)  Height: 5\' 6"  (1.676 m)    Body mass index is 27.6 kg/m.   Physical Exam Vitals signs and nursing note reviewed.  Constitutional:      Appearance: She is well-developed.  HENT:     Head: Normocephalic and atraumatic.     Mouth/Throat:     Pharynx: No oropharyngeal exudate.  Eyes:     General: No scleral icterus.    Conjunctiva/sclera: Conjunctivae normal.     Pupils: Pupils are equal, round, and reactive to light.  Neck:     Musculoskeletal: Neck supple.  Cardiovascular:     Rate and Rhythm: Normal rate and regular rhythm.     Heart sounds: Normal heart sounds. No murmur. No friction rub. No gallop.   Pulmonary:     Effort: Pulmonary effort is normal.     Breath sounds: Normal breath sounds. No wheezing or rales.  Skin:    General: Skin is warm and dry.  Neurological:     Mental Status: She is alert and oriented to person, place, and time.     No results found for this or any previous visit (from the past 24 hour(s)).  No results found.   ASSESSMENT and PLAN  1. Essential hypertension Controlled. Continue current regime.   2. Hypothyroidism, unspecified type Checking labs today, medications will be adjusted as needed.  - TSH  3. Type 2 diabetes mellitus with diabetic polyneuropathy, without long-term current use of insulin ([facility]) Checking labs today, medications will be adjusted as needed.  - Hemoglobin A1c  4. Chronic diastolic CHF (congestive heart failure) ([facility]) Controlled. Continue current regime.  - furosemide (LASIX) 40 MG tablet; Take 1 tablet (40 mg total) by mouth daily.  5. GAD (generalized anxiety disorder) 6. Long term prescription benzodiazepine use Stable. Cont current regime  7. Need for prophylactic vaccination and inoculation against influenza - Flu Vaccine QUAD High Dose(Fluad)  Other orders - polyethylene glycol (MIRALAX / GLYCOLAX) 17 g packet; Take 17 g by mouth daily as needed for mild constipation. - clonazePAM (KLONOPIN) 1 MG tablet;  TAKE 1/2 TABLET BY  MOUTH EVERY MORNING AND TAKE ONE TABLET BY MOUTH AT BEDTIME - metoprolol tartrate (LOPRESSOR) 100 MG tablet; Take 1 tablet (100 mg total) by mouth 2 (two) times daily.  Return in about 3 months (around 11/06/2019).    Rutherford Guys, MD [facility] [place], [place] [address] Ph.  [phone] Fax [phone]

## 2019-08-07 NOTE — Patient Instructions (Signed)
° ° ° °  If you have lab work done today you will be contacted with your lab results within the next 2 weeks.  If you have not heard from us then please contact us. The fastest way to get your results is to register for My Chart. ° ° °IF you received an x-ray today, you will receive an invoice from [facility]. Please contact [facility] at [phone] with questions or concerns regarding your invoice.  ° °IF you received labwork today, you will receive an invoice from [facility]. Please contact [facility] at [phone] with questions or concerns regarding your invoice.  ° °Our billing staff will not be able to assist you with questions regarding bills from these companies. ° °You will be contacted with the lab results as soon as they are available. The fastest way to get your results is to activate your My Chart account. Instructions are located on the last page of this paperwork. If you have not heard from us regarding the results in 2 weeks, please contact this office. °  ° ° ° °

## 2019-08-08 LAB — TSH: TSH: 3.3 u[IU]/mL (ref 0.450–4.500)

## 2019-08-08 LAB — HEMOGLOBIN A1C
Est. average glucose Bld gHb Est-mCnc: 166 mg/dL
Hgb A1c MFr Bld: 7.4 % — ABNORMAL HIGH (ref 4.8–5.6)

## 2019-08-12 ENCOUNTER — Other Ambulatory Visit: Payer: Self-pay

## 2019-08-12 NOTE — Patient Outreach (Signed)
[facility]) Care Management  08/12/2019  Sherri Burns 20-Dec-1935 [id number]   Medication Adherence call to [facility] spoke with patient she is past due on Atorvastatin 10 mg and Lisinopril 2.5 mg patient explain she is no longer taking both medication any longer doctor took her off patient was having side effects on both medications.[name] is showing past due under [facility].   [facility] Direct Dial [phone]  Fax [phone] [email or web address]

## 2019-08-25 ENCOUNTER — Encounter: Payer: Self-pay | Admitting: Radiology

## 2019-08-26 ENCOUNTER — Other Ambulatory Visit: Payer: Self-pay

## 2019-08-26 ENCOUNTER — Encounter: Payer: Self-pay | Admitting: Gastroenterology

## 2019-08-26 ENCOUNTER — Ambulatory Visit (INDEPENDENT_AMBULATORY_CARE_PROVIDER_SITE_OTHER): Payer: Medicare Other | Admitting: Gastroenterology

## 2019-08-26 DIAGNOSIS — K219 Gastro-esophageal reflux disease without esophagitis: Secondary | ICD-10-CM | POA: Diagnosis not present

## 2019-08-26 DIAGNOSIS — K625 Hemorrhage of anus and rectum: Secondary | ICD-10-CM

## 2019-08-26 NOTE — Patient Instructions (Addendum)
Please drink at least 64 ounces of water every day.  I recommend a high-fiber diet.  Continue to take MiraLAX every morning.  Please follow-up with Dr. Arnetha Massy ago to discuss your ongoing back pain.  Please contact me with any additional bleeding in the future or with any questions or concerns.  Fiber Chart  You should be consuming 25-30g of fiber per day and drinking 8 glasses of water to help your bowels move regularly.  In the chart below you can look up how much fiber you are getting in an average day.  If you are not getting enough fiber, you should add a fiber supplement to your diet.  Examples of this include Metamucil, FiberCon and Citrucel.  These can be purchased at your local grocery store or pharmacy.      [email or web address]
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
## 2019-08-28 ENCOUNTER — Other Ambulatory Visit: Payer: Self-pay

## 2019-08-28 NOTE — Patient Outreach (Signed)
[facility] Care Management  08/28/2019  CHRYSTLE GALLUS 07/05/1936 [id number]   Medication Adherence call to [place] spoke with patient she is past due on Atorvastatin 10 mg and Lisinopril 2.5 mg patient explain she was having side effects and was taken off both medications. Mrs. Atwal is showing past due under [place].   [facility] Management Direct Dial [phone]  Fax [phone] [email or web address]

## 2019-09-01 ENCOUNTER — Other Ambulatory Visit: Payer: Self-pay | Admitting: Gastroenterology

## 2019-09-14 ENCOUNTER — Telehealth: Payer: Self-pay | Admitting: Gastroenterology

## 2019-09-14 NOTE — Telephone Encounter (Signed)
Left message for the patient to call back.

## 2019-09-15 NOTE — Telephone Encounter (Signed)
Spoke to the patient, reported to the patient all of Dr. Tarri Glenn recommendations. Patient verbalized understanding. Patient told to call back by Friday and give a report or call back earlier if her diarrhea worsened.

## 2019-09-15 NOTE — Telephone Encounter (Signed)
Spoke to the patient who reports 3-4 days diarrhea that now seem to be resolving. She reports 2 episodes of soft stools today but the day before reports several episodes of watery diarrhea with urgency and fecal incontinence due to the urgency. The patient reports she stopped the Miralax at the onset of the diarrhea but has not taken anything to alleviate the diarrhea. Yesterday she endorsed weakness and fatigue but reports this is better today. She states she was able to cook and water her flowers, this is a huge improvement from yesterday. She reports so rectal pain due to the frequency of the diarrhea over the last 3-4 days. Please advise.

## 2019-09-15 NOTE — Telephone Encounter (Signed)
I am sorry to hear that she is having so much diarrhea, but, hopefully it's still improving.   My recommendations: - Drink a lot of liquids that have water, salt, and sugar. Good choices are water mixed with juice, flavored soda, and soup broth. If you are drinking enough, your urine will be light yellow or almost clear. - Avoid high fat foods, as they can make diarrhea worse.  - Avoid dairy products except for yogurt.  - If the diarrhea does not improve, she could try loperamide 4 mg initially, then 2 mg after each unformed stool for ?2 days, with a maximum of 16 mg/day. - I also find that a daily probiotic may be very helpful with acute diarrhea. - Continue to hold the Miralax until the diarrhea resolved.  - To prevent skin/hemorrhoid irritation - prior to wiping, put A&Dointment or vaseline on the toilet paper.  Please ask her to call back with an update later this week or earlier with any new concerns.  At that time, would proceed with GI pathogen panel and testing for Covid.

## 2019-09-16 NOTE — Telephone Encounter (Signed)
Spoke to the patient again who states she is dizzy and "off balance." She reports "my stomach feels funny." Denies N/V, endorses some bloating but is able to tolerate foods and liquids. She reports she has been drinking "lots" of Gatorade and water. Last BM, 09/15/2019, soft, denies diarrhea. Has not taken any Loperamide or any probiotics, she had no transportation to the drug store but says she will purchase when she can. Not eating fatty foods or dairy. Please advise.

## 2019-09-16 NOTE — Telephone Encounter (Signed)
Pt states  Diarrhea has stopped  Has not had a BM since yesterday .  She is not feeling good at all very tired and very bad headache.  Pt would like to know if an an antibiotic would help? Please advise.

## 2019-09-16 NOTE — Telephone Encounter (Signed)
I am glad that the diarrhea is better. Given the complaints of dizziness and being "off balance", I recommend that she review with Dr. Pamella Pert. Thank you.

## 2019-09-16 NOTE — Telephone Encounter (Signed)
Reported recommendations to the patient who verbalized understanding.

## 2019-09-17 ENCOUNTER — Ambulatory Visit (INDEPENDENT_AMBULATORY_CARE_PROVIDER_SITE_OTHER): Payer: Medicare Other | Admitting: Family Medicine

## 2019-09-17 ENCOUNTER — Encounter: Payer: Self-pay | Admitting: Family Medicine

## 2019-09-17 ENCOUNTER — Other Ambulatory Visit: Payer: Self-pay

## 2019-09-17 VITALS — BP 132/81 | HR 65 | Temp 98.5°F | Ht 66.0 in | Wt 172.0 lb

## 2019-09-17 DIAGNOSIS — H6063 Unspecified chronic otitis externa, bilateral: Secondary | ICD-10-CM

## 2019-09-17 DIAGNOSIS — R197 Diarrhea, unspecified: Secondary | ICD-10-CM | POA: Diagnosis not present

## 2019-09-17 MED ORDER — HYDROCORTISONE ACETATE 25 MG RE SUPP: 25.0000 mg | Freq: Two times a day (BID) | RECTAL | 2 refills | Status: DC

## 2019-09-17 MED ORDER — ACETIC ACID 2 % OT SOLN: 4.0000 [drp] | Freq: Three times a day (TID) | OTIC | 0 refills | Status: DC

## 2019-09-17 NOTE — Patient Instructions (Addendum)
If you have lab work done today you will be contacted with your lab results within the next 2 weeks.  If you have not heard from [place] then please contact us. The fastest way to get your results is to register for My Chart.   IF you received an x-ray today, you will receive an invoice from [facility]. Please contact [facility] at [phone] with questions or concerns regarding your invoice.   IF you received labwork today, you will receive an invoice from [facility]. Please contact [facility] at [phone] with questions or concerns regarding your invoice.   Our billing staff will not be able to assist you with questions regarding bills from these companies.  You will be contacted with the lab results as soon as they are available. The fastest way to get your results is to activate your My Chart account. Instructions are located on the last page of this paperwork. If you have not heard from [place] regarding the results in 2 weeks, please contact this office.     Food Choices to Help Relieve Diarrhea, Adult When you have diarrhea, the foods you eat and your eating habits are very important. Choosing the right foods and drinks can help:  Relieve diarrhea.  Replace lost fluids and nutrients.  Prevent dehydration. What general guidelines should I follow?  Relieving diarrhea  Choose foods with less than 2 g or .07 oz. of fiber per serving.  Limit fats to less than 8 tsp (38 g or 1.34 oz.) a day.  Avoid the following: ? Foods and beverages sweetened with high-fructose corn syrup, honey, or sugar alcohols such as xylitol, sorbitol, and mannitol. ? Foods that contain a lot of fat or sugar. ? Fried, greasy, or spicy foods. ? High-fiber grains, breads, and cereals. ? Raw fruits and vegetables.  Eat foods that are rich in probiotics. These foods include dairy products such as yogurt and fermented milk products. They help increase healthy bacteria in the stomach and  intestines (gastrointestinal tract, or GI tract).  If you have lactose intolerance, avoid dairy products. These may make your diarrhea worse.  Take medicine to help stop diarrhea (antidiarrheal medicine) only as told by your health care provider. Replacing nutrients  Eat small meals or snacks every 3-4 hours.  Eat bland foods, such as white rice, toast, or baked potato, until your diarrhea starts to get better. Gradually reintroduce nutrient-rich foods as tolerated or as told by your health care provider. This includes: ? Well-cooked protein foods. ? Peeled, seeded, and soft-cooked fruits and vegetables. ? Low-fat dairy products.  Take vitamin and mineral supplements as told by your health care provider. Preventing dehydration  Start by sipping water or a special solution to prevent dehydration (oral rehydration solution, ORS). Urine that is clear or pale yellow means that you are getting enough fluid.  Try to drink at least 8-10 cups of fluid each day to help replace lost fluids.  You may add other liquids in addition to water, such as clear juice or decaffeinated sports drinks, as tolerated or as told by your health care provider.  Avoid drinks with caffeine, such as coffee, tea, or soft drinks.  Avoid alcohol. What foods are recommended?     The items listed may not be a complete list. Talk with your health care provider about what dietary choices are best for you. Grains White rice. White, [place], or pita breads (fresh or toasted), including plain rolls, buns, or bagels. White pasta. Saltine, soda,  or graham crackers. Pretzels. Low-fiber cereal. Cooked cereals made with water (such as cornmeal, farina, or cream cereals). Plain muffins. Matzo. Melba toast. Zwieback. Vegetables Potatoes (without the skin). Most well-cooked and canned vegetables without skins or seeds. Tender lettuce. Fruits Apple sauce. Fruits canned in juice. Cooked apricots, cherries, grapefruit, peaches,  pears, or plums. Fresh bananas and cantaloupe. Meats and other protein foods Baked or boiled chicken. Eggs. Tofu. Fish. Seafood. Smooth nut butters. Ground or well-cooked tender beef, ham, veal, lamb, pork, or poultry. Dairy Plain yogurt, kefir, and unsweetened liquid yogurt. Lactose-free milk, buttermilk, skim milk, or soy milk. Low-fat or nonfat hard cheese. Beverages Water. Low-calorie sports drinks. Fruit juices without pulp. Strained tomato and vegetable juices. Decaffeinated teas. Sugar-free beverages not sweetened with sugar alcohols. Oral rehydration solutions, if approved by your health care provider. Seasoning and other foods Bouillon, broth, or soups made from recommended foods. What foods are not recommended? The items listed may not be a complete list. Talk with your health care provider about what dietary choices are best for you. Grains Whole grain, whole wheat, bran, or rye breads, rolls, pastas, and crackers. Wild or brown rice. Whole grain or bran cereals. Barley. Oats and oatmeal. Corn tortillas or taco shells. Granola. Popcorn. Vegetables Raw vegetables. Fried vegetables. Cabbage, broccoli, Brussels sprouts, artichokes, baked beans, beet greens, corn, kale, legumes, peas, sweet potatoes, and yams. Potato skins. Cooked spinach and cabbage. Fruits Dried fruit, including raisins and dates. Raw fruits. Stewed or dried prunes. Canned fruits with syrup. Meat and other protein foods Fried or fatty meats. Deli meats. Chunky nut butters. Nuts and seeds. Beans and lentils. Berniece Salines. Hot dogs. Sausage. Dairy High-fat cheeses. Whole milk, chocolate milk, and beverages made with milk, such as milk shakes. Half-and-half. Cream. sour cream. Ice cream. Beverages Caffeinated beverages (such as coffee, tea, soda, or energy drinks). Alcoholic beverages. Fruit juices with pulp. Prune juice. Soft drinks sweetened with high-fructose corn syrup or sugar alcohols. High-calorie sports drinks. Fats and  oils Butter. Cream sauces. Margarine. Salad oils. Plain salad dressings. Olives. Avocados. Mayonnaise. Sweets and desserts Sweet rolls, doughnuts, and sweet breads. Sugar-free desserts sweetened with sugar alcohols such as xylitol and sorbitol. Seasoning and other foods Honey. Hot sauce. Chili powder. Gravy. Cream-based or milk-based soups. Pancakes and waffles. Summary  When you have diarrhea, the foods you eat and your eating habits are very important.  Make sure you get at least 8-10 cups of fluid each day, or enough to keep your urine clear or pale yellow.  Eat bland foods and gradually reintroduce healthy, nutrient-rich foods as tolerated, or as told by your health care provider.  Avoid high-fiber, fried, greasy, or spicy foods. This information is not intended to replace advice given to you by your health care provider. Make sure you discuss any questions you have with your health care provider. Document Released: 02/02/2004 Document Revised: 03/05/2019 Document Reviewed: 11/09/2016 Elsevier Patient Education  2020 [employer].

## 2019-09-17 NOTE — Progress Notes (Signed)
10/22/20202:33 PM  Sherri Burns [birth year], 83 y.o., female [id number]  Chief Complaint  Patient presents with  . Follow-up    GI specialist, still feeling nauseous. Says she has swelling in the stomach. Has been having runny stool, starting to become firm again starting this morning. Taking probiotics and imodium    HPI:   Patient is a 83 y.o. female with past medical history significant for DM2, CKD3, s/p nephrectomy, HTN, anxietyon long term bzd, hypothyroidismwho presents today diarrhea  Last OV Sept 2020 Had watery diarrhea for past 4-5 days with sign bloating, mild nausea No blood or vomiting No fevers but did have chills She was very dizzy and weak Pushed fluids - now much better Today normal BM yesterday, able to have breakfast Had one immodium yesterday afternoon Started taking probiotics Today feeling much better Her sister had similar sx last week  Saw GI - mild gastritis, rectal bleeding from int hemorrhoids, no further colon cancer screening needed  Also has recurring very itchy mildly tender ear canals Denies any drainage, fever or chills Denies any hearing loss or ringing  Lab Results  Component Value Date   HGBA1C 7.4 (H) 08/07/2019   Lab Results  Component Value Date   CREATININE 1.42 (H) 06/22/2019   BUN 10 06/22/2019   NA 139 06/22/2019   K 5.2 (H) 06/22/2019   CL 104 06/22/2019   CO2 26 06/22/2019   Lab Results  Component Value Date   TSH 3.300 08/07/2019    Depression screen PHQ 2/9 09/17/2019 08/07/2019 07/03/2019  Decreased Interest 0 0 0  Down, Depressed, Hopeless 0 - 0  PHQ - 2 Score 0 0 0  Altered sleeping - - -  Tired, decreased energy - - -  Change in appetite - - -  Feeling bad or failure about yourself  - - -  Trouble concentrating - - -  Moving slowly or fidgety/restless - - -  Suicidal thoughts - - -  PHQ-9 Score - - -  Difficult doing work/chores - - -  Some recent data might be hidden    Fall Risk  09/17/2019  08/07/2019 07/02/2019 05/06/2019 02/16/2019  Falls in the past year? 0 0 0 0 0  Number falls in past yr: 0 0 0 0 0  Injury with Fall? 0 0 0 0 0  Follow up - - - - Falls evaluation completed     Allergies  Allergen Reactions  . Ace Inhibitors Swelling    See 02/16/19   . Codeine Other (See Comments)    hallucinations  . Metformin And Related     Upset stomach  . Ciprocin-Fluocin-Procin [Fluocinolone Acetonide] Other (See Comments)    Unknown reaction  . Clonidine Derivatives Other (See Comments)    Dry mouth  . Crestor [Rosuvastatin Calcium] Other (See Comments)    cramps  . Penicillins Other (See Comments)    Has patient had a PCN reaction causing immediate rash, facial/tongue/throat swelling, SOB or lightheadedness with hypotension: no Has patient had a PCN reaction causing severe rash involving mucus membranes or skin necrosis: no Has patient had a PCN reaction that required hospitalization : no Has patient had a PCN reaction occurring within the last 10 years: no -Hallucinates If all of the above answers are "NO", then may proceed with Cephalosporin use.   . Simvastatin Other (See Comments)    Upset stomach   . Tessalon Perles Other (See Comments)    Gi upset     Prior to  Admission medications   Medication Sig Start Date End Date Taking? Authorizing Provider  acetaminophen (TYLENOL) 500 MG tablet Take 500 mg by mouth every 6 (six) hours as needed for moderate pain.   Yes [provider]  amLODipine (NORVASC) 10 MG tablet Take 1 tablet (10 mg total) by mouth daily. 01/30/19  Yes Rutherford Guys, MD  Bacillus Coagulans-Inulin (ALIGN PREBIOTIC-PROBIOTIC PO) Take by mouth.   Yes [provider]  cholecalciferol (VITAMIN D) 1000 units tablet Take 1,000 Units by mouth daily.   Yes [provider]  clonazePAM (KLONOPIN) 1 MG tablet TAKE 1/2 TABLET BY MOUTH EVERY MORNING AND TAKE ONE TABLET BY MOUTH AT BEDTIME 08/07/19  Yes Rutherford Guys, MD  Colloidal  Oatmeal (ECZEMA MOISTURIZING) 1 % LOTN Apply 1 application topically as needed. Eczema cream   Yes [provider]  fluticasone (FLONASE) 50 MCG/ACT nasal spray Place 1-2 sprays into both nostrils at bedtime. Patient taking differently: Place 1 spray into both nostrils daily as needed for allergies.  01/30/19  Yes Rutherford Guys, MD  furosemide (LASIX) 40 MG tablet Take 1 tablet (40 mg total) by mouth daily. 08/07/19  Yes Rutherford Guys, MD  glipiZIDE (GLUCOTROL) 10 MG tablet TAKE TWO TABLETS BY MOUTH TWICE A DAY 30 MINUTES BEFORE MEALS Patient taking differently: Take 20 mg by mouth 2 (two) times daily.  07/19/19  Yes Rutherford Guys, MD  hydrocortisone (ANUSOL-HC) 25 MG suppository Place 1 suppository (25 mg total) rectally 2 (two) times daily. 05/06/19  Yes Rutherford Guys, MD  hydroxypropyl methylcellulose / hypromellose (ISOPTO TEARS / GONIOVISC) 2.5 % ophthalmic solution Place 2 drops into both eyes 3 (three) times daily as needed for dry eyes.   Yes [provider]  levothyroxine (SYNTHROID) 88 MCG tablet TAKE ONE TABLET BY MOUTH DAILY BEFORE BREAKFAST Patient taking differently: Take 88 mcg by mouth daily before breakfast.  03/18/19  Yes Rutherford Guys, MD  loperamide (IMODIUM) 2 MG capsule Take by mouth as needed for diarrhea or loose stools.   Yes [provider]  metoprolol tartrate (LOPRESSOR) 100 MG tablet Take 1 tablet (100 mg total) by mouth 2 (two) times daily. 08/07/19  Yes Rutherford Guys, MD  omeprazole (PRILOSEC) 40 MG capsule Take 1 capsule (40 mg total) by mouth 2 (two) times daily. 07/23/19 09/21/19 Yes Thornton Park, MD  polyethylene glycol (MIRALAX / GLYCOLAX) 17 g packet Take 17 g by mouth daily as needed for mild constipation. 08/07/19  Yes Rutherford Guys, MD  vitamin E 100 UNIT capsule Take 100 Units by mouth daily.   Yes [provider]    Past Medical History:  Diagnosis Date  . Abnormal EKG 02/07/2016   Inferolateral T wave  inversion and ST depression.  . Acute calculous cholecystitis   . Anxiety   . Arthritis   . Chronic diastolic CHF (congestive heart failure) ([place]) 02/21/2018  . Colon polyps 11/2008   tubular adenoma  . Diabetes mellitus   . Esophageal problem    esophageal dilation  . GERD (gastroesophageal reflux disease)    + hpylori  . GI bleeding 01/2018  . Headache   . Hyperlipidemia   . Hypertension   . Hypothyroidism   . Ischemic colitis ([place])   . Renal cancer, right ([place]) 2010   s/p Rt nephrectomy by Dr. Rosana Hoes  . Renal insufficiency     Past Surgical History:  Procedure Laterality Date  . ABDOMINAL HYSTERECTOMY  1978   partial  .  CHOLECYSTECTOMY N/A 04/04/2016   Procedure: LAPAROSCOPIC CHOLECYSTECTOMY;  Surgeon: Autumn Messing III, MD;  Location: [facility];  Service: General;  Laterality: N/A;  . COLONOSCOPY    . ESOPHAGEAL DILATION  2016  . HERNIA REPAIR    . LAPAROSCOPIC CHOLECYSTECTOMY  04/04/2016  . NEPHRECTOMY Right 2010   Dr. Rosana Hoes, urology, due to renal cancer    Social History   Tobacco Use  . Smoking status: Current Some Day Smoker    Packs/day: 0.25    Years: 55.00    Pack years: 13.75    Types: Cigarettes  . Smokeless tobacco: Never Used  . Tobacco comment: cut back amount still  trying to quit  Substance Use Topics  . Alcohol use: No    Alcohol/week: 0.0 standard drinks    Family History  Problem Relation Age of Onset  . Cancer Mother        colon, kidney cancer  . Cancer Father        throat cancer  . Cancer Sister   . Heart attack Brother     ROS Per hpi  OBJECTIVE:  Today's Vitals   09/17/19 1426  BP: 132/81  Pulse: 65  Temp: 98.5 F (36.9 C)  SpO2: 96%  Weight: 172 lb (78 kg)  Height: 5\' 6"  (1.676 m)   Body mass index is 27.76 kg/m.   Physical Exam Vitals signs and nursing note reviewed.  Constitutional:      Appearance: She is well-developed.  HENT:     Head: Normocephalic and atraumatic.     Right Ear: Hearing, tympanic membrane and  external ear normal.     Left Ear: Hearing, tympanic membrane and external ear normal.     Ears:     Comments: Mild erythema of both ear canals Eyes:     Conjunctiva/sclera: Conjunctivae normal.     Pupils: Pupils are equal, round, and reactive to light.  Neck:     Musculoskeletal: Neck supple.  Cardiovascular:     Rate and Rhythm: Normal rate and regular rhythm.     Heart sounds: Normal heart sounds. No murmur. No friction rub. No gallop.   Pulmonary:     Effort: Pulmonary effort is normal.     Breath sounds: Normal breath sounds. No wheezing, rhonchi or rales.  Abdominal:     General: Bowel sounds are normal. There is no distension.     Palpations: Abdomen is soft. There is no mass.     Tenderness: There is abdominal tenderness (diffuse, mild). There is no guarding or rebound.  Lymphadenopathy:     Cervical: No cervical adenopathy.  Skin:    General: Skin is warm and dry.  Neurological:     Mental Status: She is alert and oriented to person, place, and time.       No results found for this or any previous visit (from the past 24 hour(s)).  No results found.   ASSESSMENT and PLAN  1. Diarrhea of presumed infectious origin Most likely VGE, resolving, cont with supportive measures. Discussed BRAT diet, pushing fluids and RTC precautions  2. Chronic otitis externa of both ears, unspecified type Mild. Acetic acid ggt. Discussed supportive measures and RTC precautions  Other orders - Bacillus Coagulans-Inulin (ALIGN PREBIOTIC-PROBIOTIC PO); Take by mouth. - loperamide (IMODIUM) 2 MG capsule; Take by mouth as needed for diarrhea or loose stools. - acetic acid 2 % otic solution; Place 4 drops into both ears 3 (three) times daily. - hydrocortisone (ANUSOL-HC) 25 MG suppository; Place 1  suppository (25 mg total) rectally 2 (two) times daily.  Return for as scheduled.    Rutherford Guys, MD Primary [facility] at [facility] [place], [place] [address] Ph.  [phone]  Fax [phone]

## 2019-10-07 DIAGNOSIS — H40023 Open angle with borderline findings, high risk, bilateral: Secondary | ICD-10-CM | POA: Diagnosis not present

## 2019-10-07 DIAGNOSIS — H25013 Cortical age-related cataract, bilateral: Secondary | ICD-10-CM | POA: Diagnosis not present

## 2019-10-07 DIAGNOSIS — H2513 Age-related nuclear cataract, bilateral: Secondary | ICD-10-CM | POA: Diagnosis not present

## 2019-10-07 DIAGNOSIS — H35033 Hypertensive retinopathy, bilateral: Secondary | ICD-10-CM | POA: Diagnosis not present

## 2019-10-07 DIAGNOSIS — H2511 Age-related nuclear cataract, right eye: Secondary | ICD-10-CM | POA: Diagnosis not present

## 2019-10-07 DIAGNOSIS — H35363 Drusen (degenerative) of macula, bilateral: Secondary | ICD-10-CM | POA: Diagnosis not present

## 2019-10-07 DIAGNOSIS — H52213 Irregular astigmatism, bilateral: Secondary | ICD-10-CM | POA: Diagnosis not present

## 2019-10-07 LAB — HM DIABETES EYE EXAM

## 2019-10-12 ENCOUNTER — Encounter: Payer: Self-pay | Admitting: *Deleted

## 2019-10-12 DIAGNOSIS — I129 Hypertensive chronic kidney disease with stage 1 through stage 4 chronic kidney disease, or unspecified chronic kidney disease: Secondary | ICD-10-CM | POA: Diagnosis not present

## 2019-10-12 DIAGNOSIS — R809 Proteinuria, unspecified: Secondary | ICD-10-CM | POA: Diagnosis not present

## 2019-10-12 DIAGNOSIS — N183 Chronic kidney disease, stage 3 unspecified: Secondary | ICD-10-CM | POA: Diagnosis not present

## 2019-10-12 DIAGNOSIS — D631 Anemia in chronic kidney disease: Secondary | ICD-10-CM | POA: Diagnosis not present

## 2019-10-12 DIAGNOSIS — N189 Chronic kidney disease, unspecified: Secondary | ICD-10-CM | POA: Diagnosis not present

## 2019-10-12 DIAGNOSIS — E1122 Type 2 diabetes mellitus with diabetic chronic kidney disease: Secondary | ICD-10-CM | POA: Diagnosis not present

## 2019-10-17 ENCOUNTER — Other Ambulatory Visit: Payer: Self-pay | Admitting: Family Medicine

## 2019-10-19 ENCOUNTER — Ambulatory Visit (INDEPENDENT_AMBULATORY_CARE_PROVIDER_SITE_OTHER): Payer: Medicare Other | Admitting: Family Medicine

## 2019-10-19 VITALS — BP 132/81 | Ht 66.5 in | Wt 172.0 lb

## 2019-10-19 DIAGNOSIS — Z0000 Encounter for general adult medical examination without abnormal findings: Secondary | ICD-10-CM

## 2019-10-19 NOTE — Progress Notes (Signed)
Presents today for [employer] Visit   Date of last exam: 09/17/2019  Interpreter used for this visit? No  I connected with  [name] on 10/19/19 telephone  and verified that I am speaking with the correct person using two identifiers.   I discussed the limitations of evaluation and management by telemedicine. The patient expressed understanding and agreed to proceed.   Patient Care Team: Rutherford Guys, MD as PCP - General (Family Medicine) Troy Sine, MD (Cardiology) Richmond Campbell, MD (Gastroenterology) Barbaraann Rondo, MD (Obstetrics and Gynecology) Myrlene Broker, MD (Urology) Marygrace Drought, MD as Consulting Physician (Ophthalmology)   Other items to address today:   Discussed Eye/Dental Having Cataract removed Discussed immunizations Follow up scheduled 12/10 [place]   Other Screening: Last screening for diabetes: 05/06/2019  ADVANCE DIRECTIVES: Discussed: yes On File: no  Materials Provided: yes (mailed)  Immunization status:  Immunization History  Administered Date(s) Administered  . Fluad Quad(high Dose 65+) 08/07/2019  . Influenza Split 10/23/2011, 09/23/2012  . Influenza, High Dose Seasonal PF 01/30/2019  . Influenza,inj,Quad PF,6+ Mos 08/11/2013, 09/07/2014, 07/28/2015, 10/02/2016, 12/07/2017  . Pneumococcal Conjugate-13 11/29/2015  . Pneumococcal Polysaccharide-23 12/05/2004  . Td 08/26/2007     There are no preventive care reminders to display for this patient.   Functional Status Survey: Is the patient deaf or have difficulty hearing?: No Does the patient have difficulty seeing, even when wearing glasses/contacts?: No Does the patient have difficulty concentrating, remembering, or making decisions?: No Does the patient have difficulty walking or climbing stairs?: Yes(pain in legs) Does the patient have difficulty dressing or bathing?: No Does the patient have difficulty doing errands alone such as  visiting a doctor's office or shopping?: No   6CIT Screen 10/19/2019  What Year? 0 points  What month? 0 points  What time? 0 points  Count back from 20 0 points  Months in reverse 0 points  Repeat phrase 2 points  Total Score 2        Clinical Support from 10/19/2019 in [place] at [facility]  AUDIT-C Score  0       Home Environment:   Lives in a one story home Does not climb stairs unless she has too Pain in legs No scattered rugs No grab bars Adequate lighting/no clutter   Patient Active Problem List   Diagnosis Date Noted  . GAD (generalized anxiety disorder) 08/07/2019  . Long term prescription benzodiazepine use 08/07/2019  . GI bleed 04/10/2019  . S/p nephrectomy 06/24/2018  . Chronic diastolic CHF (congestive heart failure) ([place]) 02/21/2018  . Acute lower GI bleeding 02/20/2018  . Hyperlipidemia   . Abnormal EKG 02/07/2016  . Type 2 diabetes mellitus with diabetic polyneuropathy, without long-term current use of insulin ([place]) 09/23/2012  . Essential hypertension   . Hypothyroidism   . CKD (chronic kidney disease), stage III      Past Medical History:  Diagnosis Date  . Abnormal EKG 02/07/2016   Inferolateral T wave inversion and ST depression.  . Acute calculous cholecystitis   . Anxiety   . Arthritis   . Chronic diastolic CHF (congestive heart failure) ([place]) 02/21/2018  . Colon polyps 11/2008   tubular adenoma  . Diabetes mellitus   . Esophageal problem    esophageal dilation  . GERD (gastroesophageal reflux disease)    + hpylori  . GI bleeding 01/2018  . Headache   . Hyperlipidemia   . Hypertension   . Hypothyroidism   .  Ischemic colitis ([place])   . Renal cancer, right ([place]) 2010   s/p Rt nephrectomy by Dr. Rosana Hoes  . Renal insufficiency      Past Surgical History:  Procedure Laterality Date  . ABDOMINAL HYSTERECTOMY  1978   partial  . CHOLECYSTECTOMY N/A 04/04/2016   Procedure: LAPAROSCOPIC CHOLECYSTECTOMY;  Surgeon: Autumn Messing III, MD;   Location: [facility];  Service: General;  Laterality: N/A;  . COLONOSCOPY    . ESOPHAGEAL DILATION  2016  . HERNIA REPAIR    . LAPAROSCOPIC CHOLECYSTECTOMY  04/04/2016  . NEPHRECTOMY Right 2010   Dr. Rosana Hoes, urology, due to renal cancer     Family History  Problem Relation Age of Onset  . Cancer Mother        colon, kidney cancer  . Cancer Father        throat cancer  . Cancer Sister   . Heart attack Brother      Social History   Socioeconomic History  . Marital status: Widowed    Spouse name: Not on file  . Number of children: 2  . Years of education: Not on file  . Highest education level: Not on file  Occupational History  . Not on file  Social Needs  . Financial resource strain: Not on file  . Food insecurity    Worry: Not on file    Inability: Not on file  . Transportation needs    Medical: Not on file    Non-medical: Not on file  Tobacco Use  . Smoking status: Current Some Day Smoker    Packs/day: 0.25    Years: 55.00    Pack years: 13.75    Types: Cigarettes  . Smokeless tobacco: Never Used  . Tobacco comment: cut back amount still  trying to quit  Substance and Sexual Activity  . Alcohol use: No    Alcohol/week: 0.0 standard drinks  . Drug use: No  . Sexual activity: Not Currently  Lifestyle  . Physical activity    Days per week: Not on file    Minutes per session: Not on file  . Stress: Not on file  Relationships  . Social Herbalist on phone: Not on file    Gets together: Not on file    Attends religious service: Not on file    Active member of club or organization: Not on file    Attends meetings of clubs or organizations: Not on file    Relationship status: Not on file  . Intimate partner violence    Fear of current or ex partner: Not on file    Emotionally abused: Not on file    Physically abused: Not on file    Forced sexual activity: Not on file  Other Topics Concern  . Not on file  Social History Narrative   Loss of son.      Allergies  Allergen Reactions  . Ace Inhibitors Swelling    See 02/16/19   . Codeine Other (See Comments)    hallucinations  . Metformin And Related     Upset stomach  . Ciprocin-Fluocin-Procin [Fluocinolone Acetonide] Other (See Comments)    Unknown reaction  . Clonidine Derivatives Other (See Comments)    Dry mouth  . Crestor [Rosuvastatin Calcium] Other (See Comments)    cramps  . Penicillins Other (See Comments)    Has patient had a PCN reaction causing immediate rash, facial/tongue/throat swelling, SOB or lightheadedness with hypotension: no Has patient had a PCN reaction  causing severe rash involving mucus membranes or skin necrosis: no Has patient had a PCN reaction that required hospitalization : no Has patient had a PCN reaction occurring within the last 10 years: no -Hallucinates If all of the above answers are "NO", then may proceed with Cephalosporin use.   . Simvastatin Other (See Comments)    Upset stomach   . Tessalon Perles Other (See Comments)    Gi upset      Prior to Admission medications   Medication Sig Start Date End Date Taking? Authorizing Provider  acetaminophen (TYLENOL) 500 MG tablet Take 500 mg by mouth every 6 (six) hours as needed for moderate pain.   Yes [provider]  acetic acid 2 % otic solution Place 4 drops into both ears 3 (three) times daily. 09/17/19  Yes Rutherford Guys, MD  amLODipine (NORVASC) 10 MG tablet Take 1 tablet (10 mg total) by mouth daily. 01/30/19  Yes Rutherford Guys, MD  cholecalciferol (VITAMIN D) 1000 units tablet Take 1,000 Units by mouth daily.   Yes [provider]  clonazePAM (KLONOPIN) 1 MG tablet TAKE 1/2 TABLET BY MOUTH EVERY MORNING AND TAKE ONE TABLET BY MOUTH AT BEDTIME 08/07/19  Yes Rutherford Guys, MD  furosemide (LASIX) 40 MG tablet Take 1 tablet (40 mg total) by mouth daily. 08/07/19  Yes Rutherford Guys, MD  glipiZIDE (GLUCOTROL) 10 MG tablet Take 2 tablets (20 mg total) by mouth 2  (two) times daily. 10/17/19  Yes Rutherford Guys, MD  hydroxypropyl methylcellulose / hypromellose (ISOPTO TEARS / GONIOVISC) 2.5 % ophthalmic solution Place 2 drops into both eyes 3 (three) times daily as needed for dry eyes.   Yes [provider]  levothyroxine (SYNTHROID) 88 MCG tablet TAKE ONE TABLET BY MOUTH DAILY BEFORE BREAKFAST Patient taking differently: Take 88 mcg by mouth daily before breakfast.  03/18/19  Yes Rutherford Guys, MD  loperamide (IMODIUM) 2 MG capsule Take by mouth as needed for diarrhea or loose stools.   Yes [provider]  metoprolol tartrate (LOPRESSOR) 100 MG tablet Take 1 tablet (100 mg total) by mouth 2 (two) times daily. 08/07/19  Yes Rutherford Guys, MD  omeprazole (PRILOSEC) 40 MG capsule Take 1 capsule (40 mg total) by mouth 2 (two) times daily. 07/23/19 10/19/19 Yes Thornton Park, MD  vitamin E 100 UNIT capsule Take 100 Units by mouth daily.   Yes [provider]  Bacillus Coagulans-Inulin (ALIGN PREBIOTIC-PROBIOTIC PO) Take by mouth.    [provider]  Colloidal Oatmeal (ECZEMA MOISTURIZING) 1 % LOTN Apply 1 application topically as needed. Eczema cream    [provider]  fluticasone (FLONASE) 50 MCG/ACT nasal spray Place 1-2 sprays into both nostrils at bedtime. Patient taking differently: Place 1 spray into both nostrils daily as needed for allergies.  01/30/19   Rutherford Guys, MD  hydrocortisone (ANUSOL-HC) 25 MG suppository Place 1 suppository (25 mg total) rectally 2 (two) times daily. Patient not taking: Reported on 10/19/2019 09/17/19   Rutherford Guys, MD  polyethylene glycol (MIRALAX / GLYCOLAX) 17 g packet Take 17 g by mouth daily as needed for mild constipation. Patient not taking: Reported on 10/19/2019 08/07/19   Rutherford Guys, MD     Depression screen [facility] 2/9 10/19/2019 09/17/2019 08/07/2019 07/03/2019 07/02/2019  Decreased Interest 0 0 0 0 0  Down, Depressed, Hopeless 0 0 - 0 0  PHQ - 2 Score  0 0 0 0 0  Altered sleeping - - - - -  Tired, decreased energy - - - - -  Change in appetite - - - - -  Feeling bad or failure about yourself  - - - - -  Trouble concentrating - - - - -  Moving slowly or fidgety/restless - - - - -  Suicidal thoughts - - - - -  PHQ-9 Score - - - - -  Difficult doing work/chores - - - - -  Some recent data might be hidden     Fall Risk  10/19/2019 09/17/2019 08/07/2019 07/02/2019 05/06/2019  Falls in the past year? 0 0 0 0 0  Number falls in past yr: 0 0 0 0 0  Injury with Fall? 0 0 0 0 0  Follow up Falls evaluation completed;Education provided - - - -      PHYSICAL EXAM: BP 132/81 Comment: taken from previous visit  Ht 5' 6.5" (1.689 m)   Wt 172 lb (78 kg) Comment: taken from previous visit  BMI 27.35 kg/m    Wt Readings from Last 3 Encounters:  10/19/19 172 lb (78 kg)  09/17/19 172 lb (78 kg)  08/07/19 171 lb (77.6 kg)    Medicare annual wellness visit, subsequent    Education/Counseling provided regarding diet and exercise, prevention of chronic diseases, smoking/tobacco cessation, if applicable, and reviewed "Covered Medicare Preventive Services."

## 2019-10-19 NOTE — Patient Instructions (Addendum)
Thank you for taking time to come for your Medicare Wellness Visit. I appreciate your ongoing commitment to your health goals. Please review the following plan we discussed and let me know if I can assist you in the future.  Shankar Silber LPN  Preventive Care 81 Years and Older, Female Preventive care refers to lifestyle choices and visits with your health care provider that can promote health and wellness. This includes:  A yearly physical exam. This is also called an annual well check.  Regular dental and eye exams.  Immunizations.  Screening for certain conditions.  Healthy lifestyle choices, such as diet and exercise. What can I expect for my preventive care visit? Physical exam Your health care provider will check:  Height and weight. These may be used to calculate body mass index (BMI), which is a measurement that tells if you are at a healthy weight.  Heart rate and blood pressure.  Your skin for abnormal spots. Counseling Your health care provider may ask you questions about:  Alcohol, tobacco, and drug use.  Emotional well-being.  Home and relationship well-being.  Sexual activity.  Eating habits.  History of falls.  Memory and ability to understand (cognition).  Work and work environment.  Pregnancy and menstrual history. What immunizations do I need?  Influenza (flu) vaccine  This is recommended every year. Tetanus, diphtheria, and pertussis (Tdap) vaccine  You may need a Td booster every 10 years. Varicella (chickenpox) vaccine  You may need this vaccine if you have not already been vaccinated. Zoster (shingles) vaccine  You may need this after age 81. Pneumococcal conjugate (PCV13) vaccine  One dose is recommended after age 81. Pneumococcal polysaccharide (PPSV23) vaccine  One dose is recommended after age 81. Measles, mumps, and rubella (MMR) vaccine  You may need at least one dose of MMR if you were born in 1957 or later. You may also  need a second dose. Meningococcal conjugate (MenACWY) vaccine  You may need this if you have certain conditions. Hepatitis A vaccine  You may need this if you have certain conditions or if you travel or work in places where you may be exposed to hepatitis A. Hepatitis B vaccine  You may need this if you have certain conditions or if you travel or work in places where you may be exposed to hepatitis B. Haemophilus influenzae type b (Hib) vaccine  You may need this if you have certain conditions. You may receive vaccines as individual doses or as more than one vaccine together in one shot (combination vaccines). Talk with your health care provider about the risks and benefits of combination vaccines. What tests do I need? Blood tests  Lipid and cholesterol levels. These may be checked every 5 years, or more frequently depending on your overall health.  Hepatitis C test.  Hepatitis B test. Screening  Lung cancer screening. You may have this screening every year starting at age 81 if you have a 30-pack-year history of smoking and currently smoke or have quit within the past 15 years.  Colorectal cancer screening. All adults should have this screening starting at age 81 and continuing until age 81. Your health care provider may recommend screening at age 81 if you are at increased risk. You will have tests every 1-10 years, depending on your results and the type of screening test.  Diabetes screening. This is done by checking your blood sugar (glucose) after you have not eaten for a while (fasting). You may have this done every 1-3   years.  Mammogram. This may be done every 1-2 years. Talk with your health care provider about how often you should have regular mammograms.  BRCA-related cancer screening. This may be done if you have a family history of breast, ovarian, tubal, or peritoneal cancers. Other tests  Sexually transmitted disease (STD) testing.  Bone density scan. This is done  to screen for osteoporosis. You may have this done starting at age 6. Follow these instructions at home: Eating and drinking  Eat a diet that includes fresh fruits and vegetables, whole grains, lean protein, and low-fat dairy products. Limit your intake of foods with high amounts of sugar, saturated fats, and salt.  Take vitamin and mineral supplements as recommended by your health care provider.  Do not drink alcohol if your health care provider tells you not to drink.  If you drink alcohol: ? Limit how much you have to 0-1 drink a day. ? Be aware of how much alcohol is in your drink. In the [place] one drink equals one 12 oz bottle of beer (355 mL), one 5 oz glass of wine (148 mL), or one 1 oz glass of hard liquor (44 mL). Lifestyle  Take daily care of your teeth and gums.  Stay active. Exercise for at least 30 minutes on 5 or more days each week.  Do not use any products that contain nicotine or tobacco, such as cigarettes, e-cigarettes, and chewing tobacco. If you need help quitting, ask your health care provider.  If you are sexually active, practice safe sex. Use a condom or other form of protection in order to prevent STIs (sexually transmitted infections).  Talk with your health care provider about taking a low-dose aspirin or statin. What's next?  Go to your health care provider once a year for a well check visit.  Ask your health care provider how often you should have your eyes and teeth checked.  Stay up to date on all vaccines. This information is not intended to replace advice given to you by your health care provider. Make sure you discuss any questions you have with your health care provider. Document Released: 12/09/2015 Document Revised: 11/06/2018 Document Reviewed: 11/06/2018 Elsevier Patient Education  2020 [employer].

## 2019-10-27 DIAGNOSIS — H25811 Combined forms of age-related cataract, right eye: Secondary | ICD-10-CM | POA: Diagnosis not present

## 2019-10-27 DIAGNOSIS — H2511 Age-related nuclear cataract, right eye: Secondary | ICD-10-CM | POA: Diagnosis not present

## 2019-10-28 ENCOUNTER — Other Ambulatory Visit: Payer: Self-pay | Admitting: Family Medicine

## 2019-10-28 DIAGNOSIS — I5032 Chronic diastolic (congestive) heart failure: Secondary | ICD-10-CM

## 2019-11-05 ENCOUNTER — Ambulatory Visit: Payer: Medicare Other | Admitting: Family Medicine

## 2019-11-09 ENCOUNTER — Other Ambulatory Visit: Payer: Self-pay

## 2019-11-09 ENCOUNTER — Ambulatory Visit (INDEPENDENT_AMBULATORY_CARE_PROVIDER_SITE_OTHER): Payer: Medicare Other | Admitting: Family Medicine

## 2019-11-09 ENCOUNTER — Encounter: Payer: Self-pay | Admitting: Family Medicine

## 2019-11-09 VITALS — BP 116/84 | HR 72 | Temp 97.3°F | Ht 66.5 in | Wt 171.8 lb

## 2019-11-09 DIAGNOSIS — E1142 Type 2 diabetes mellitus with diabetic polyneuropathy: Secondary | ICD-10-CM | POA: Diagnosis not present

## 2019-11-09 DIAGNOSIS — I10 Essential (primary) hypertension: Secondary | ICD-10-CM

## 2019-11-09 DIAGNOSIS — G8929 Other chronic pain: Secondary | ICD-10-CM | POA: Diagnosis not present

## 2019-11-09 DIAGNOSIS — E782 Mixed hyperlipidemia: Secondary | ICD-10-CM

## 2019-11-09 DIAGNOSIS — M25511 Pain in right shoulder: Secondary | ICD-10-CM

## 2019-11-09 DIAGNOSIS — Z789 Other specified health status: Secondary | ICD-10-CM

## 2019-11-09 MED ORDER — DICLOFENAC SODIUM 1 % EX GEL: 4.0000 g | Freq: Four times a day (QID) | CUTANEOUS | 5 refills | Status: DC

## 2019-11-09 NOTE — Progress Notes (Signed)
12/14/20204:42 PM  Sherri Burns 1936-02-02, 83 y.o., female [id number]  Chief Complaint  Patient presents with  . Hypertension    3 month follow up, had eye surgery last wk on Tues, the other eye will be done on 11/24/2019. Has been having headaches due to the eye surgery    HPI:   Patient is a 83 y.o. female with past medical history significant for DM2, CKD3, s/p nephrectomy, HTN, anxietyon long term bzd, hypothyroidism who presents today for followup  She is overall doing well In process of getting cataract surgery She does not check her cbgs  She does not check BP Her anxiety is well controlled with bzd She last saw renal about a month ago Requesting refill of voltaren gel, works really well fo her right shoulder pain and low back pain   Lab Results  Component Value Date   HGBA1C 7.4 (H) 08/07/2019   HGBA1C 7.1 (H) 05/06/2019   HGBA1C 7.4 (A) 01/30/2019   Lab Results  Component Value Date   MICROALBUR 0.7 10/02/2016   LDLCALC 144 (H) 01/30/2019   CREATININE 1.42 (H) 06/22/2019   Lab Results  Component Value Date   TSH 3.300 08/07/2019    Depression screen PHQ 2/9 10/19/2019 09/17/2019 08/07/2019  Decreased Interest 0 0 0  Down, Depressed, Hopeless 0 0 -  PHQ - 2 Score 0 0 0  Altered sleeping - - -  Tired, decreased energy - - -  Change in appetite - - -  Feeling bad or failure about yourself  - - -  Trouble concentrating - - -  Moving slowly or fidgety/restless - - -  Suicidal thoughts - - -  PHQ-9 Score - - -  Difficult doing work/chores - - -  Some recent data might be hidden    Fall Risk  11/09/2019 10/19/2019 09/17/2019 08/07/2019 07/02/2019  Falls in the past year? 0 0 0 0 0  Number falls in past yr: 0 0 0 0 0  Injury with Fall? 0 0 0 0 0  Follow up - Falls evaluation completed;Education provided - - -     Allergies  Allergen Reactions  . Ace Inhibitors Swelling    See 02/16/19   . Codeine Other (See Comments)    hallucinations  .  Metformin And Related     Upset stomach  . Ciprocin-Fluocin-Procin [Fluocinolone Acetonide] Other (See Comments)    Unknown reaction  . Clonidine Derivatives Other (See Comments)    Dry mouth  . Crestor [Rosuvastatin Calcium] Other (See Comments)    cramps  . Penicillins Other (See Comments)    Has patient had a PCN reaction causing immediate rash, facial/tongue/throat swelling, SOB or lightheadedness with hypotension: no Has patient had a PCN reaction causing severe rash involving mucus membranes or skin necrosis: no Has patient had a PCN reaction that required hospitalization : no Has patient had a PCN reaction occurring within the last 10 years: no -Hallucinates If all of the above answers are "NO", then may proceed with Cephalosporin use.   . Simvastatin Other (See Comments)    Upset stomach   . Tessalon Perles Other (See Comments)    Gi upset     Prior to Admission medications   Medication Sig Start Date End Date Taking? Authorizing Provider  acetaminophen (TYLENOL) 500 MG tablet Take 500 mg by mouth every 6 (six) hours as needed for moderate pain.   Yes [provider]  acetic acid 2 % otic solution Place 4 drops  into both ears 3 (three) times daily. 09/17/19  Yes Rutherford Guys, MD  amLODipine (NORVASC) 10 MG tablet Take 1 tablet (10 mg total) by mouth daily. 01/30/19  Yes Rutherford Guys, MD  Bacillus Coagulans-Inulin (ALIGN PREBIOTIC-PROBIOTIC PO) Take by mouth.   Yes [provider]  brimonidine (ALPHAGAN) 0.2 % ophthalmic solution Place 1 drop into the right eye 2 (two) times daily. 10/27/19  Yes [provider]  cholecalciferol (VITAMIN D) 1000 units tablet Take 1,000 Units by mouth daily.   Yes [provider]  clonazePAM (KLONOPIN) 1 MG tablet TAKE 1/2 TABLET BY MOUTH EVERY MORNING AND TAKE ONE TABLET BY MOUTH AT BEDTIME 08/07/19  Yes Rutherford Guys, MD  Colloidal Oatmeal (ECZEMA MOISTURIZING) 1 % LOTN Apply 1 application topically  as needed. Eczema cream   Yes [provider]  CYCLOGYL 2 % ophthalmic solution Place 1 drop into the right eye 3 (three) times daily. 10/27/19  Yes [provider]  diltiazem (TIAZAC) 360 MG 24 hr capsule  10/27/19  Yes [provider]  fluticasone (FLONASE) 50 MCG/ACT nasal spray Place 1-2 sprays into both nostrils at bedtime. Patient taking differently: Place 1 spray into both nostrils daily as needed for allergies.  01/30/19  Yes Rutherford Guys, MD  furosemide (LASIX) 40 MG tablet TAKE ONE TABLET BY MOUTH DAILY 10/28/19  Yes Rutherford Guys, MD  glipiZIDE (GLUCOTROL) 10 MG tablet Take 2 tablets (20 mg total) by mouth 2 (two) times daily. 10/17/19  Yes Rutherford Guys, MD  hydrocortisone (ANUSOL-HC) 25 MG suppository Place 1 suppository (25 mg total) rectally 2 (two) times daily. 09/17/19  Yes Rutherford Guys, MD  hydroxypropyl methylcellulose / hypromellose (ISOPTO TEARS / GONIOVISC) 2.5 % ophthalmic solution Place 2 drops into both eyes 3 (three) times daily as needed for dry eyes.   Yes [provider]  ketorolac (ACULAR) 0.5 % ophthalmic solution Place 1 drop into the right eye 4 (four) times daily. 10/27/19  Yes [provider]  levothyroxine (SYNTHROID) 88 MCG tablet TAKE ONE TABLET BY MOUTH DAILY BEFORE BREAKFAST Patient taking differently: Take 88 mcg by mouth daily before breakfast.  03/18/19  Yes Rutherford Guys, MD  loperamide (IMODIUM) 2 MG capsule Take by mouth as needed for diarrhea or loose stools.   Yes [provider]  metoprolol tartrate (LOPRESSOR) 100 MG tablet Take 1 tablet (100 mg total) by mouth 2 (two) times daily. 08/07/19  Yes Rutherford Guys, MD  polyethylene glycol (MIRALAX / GLYCOLAX) 17 g packet Take 17 g by mouth daily as needed for mild constipation. 08/07/19  Yes Rutherford Guys, MD  vitamin E 100 UNIT capsule Take 100 Units by mouth daily.   Yes [provider]  omeprazole (PRILOSEC) 40 MG capsule Take  1 capsule (40 mg total) by mouth 2 (two) times daily. 07/23/19 10/19/19  Thornton Park, MD    Past Medical History:  Diagnosis Date  . Abnormal EKG 02/07/2016   Inferolateral T wave inversion and ST depression.  . Acute calculous cholecystitis   . Anxiety   . Arthritis   . Chronic diastolic CHF (congestive heart failure) ([place]) 02/21/2018  . Colon polyps 11/2008   tubular adenoma  . Diabetes mellitus   . Esophageal problem    esophageal dilation  . GERD (gastroesophageal reflux disease)    + hpylori  . GI bleeding 01/2018  . Headache   . Hyperlipidemia   . Hypertension   . Hypothyroidism   .  Ischemic colitis ([place])   . Renal cancer, right ([place]) 2010   s/p Rt nephrectomy by Dr. Rosana Hoes  . Renal insufficiency     Past Surgical History:  Procedure Laterality Date  . ABDOMINAL HYSTERECTOMY  1978   partial  . CHOLECYSTECTOMY N/A 04/04/2016   Procedure: LAPAROSCOPIC CHOLECYSTECTOMY;  Surgeon: Autumn Messing III, MD;  Location: [facility];  Service: General;  Laterality: N/A;  . COLONOSCOPY    . ESOPHAGEAL DILATION  2016  . HERNIA REPAIR    . LAPAROSCOPIC CHOLECYSTECTOMY  04/04/2016  . NEPHRECTOMY Right 2010   Dr. Rosana Hoes, urology, due to renal cancer    Social History   Tobacco Use  . Smoking status: Current Some Day Smoker    Packs/day: 0.25    Years: 55.00    Pack years: 13.75    Types: Cigarettes  . Smokeless tobacco: Never Used  . Tobacco comment: cut back amount still  trying to quit  Substance Use Topics  . Alcohol use: No    Alcohol/week: 0.0 standard drinks    Family History  Problem Relation Age of Onset  . Cancer Mother        colon, kidney cancer  . Cancer Father        throat cancer  . Cancer Sister   . Heart attack Brother     Review of Systems  Constitutional: Negative for chills and fever.  Respiratory: Negative for cough and shortness of breath.   Cardiovascular: Negative for chest pain, palpitations and leg swelling.  Gastrointestinal: Negative for  abdominal pain, nausea and vomiting.     OBJECTIVE:  Today's Vitals   11/09/19 1622  BP: 116/84  Pulse: 72  Temp: (!) 97.3 F (36.3 C)  SpO2: 96%  Weight: 171 lb 12.8 oz (77.9 kg)  Height: 5' 6.5" (1.689 m)   Body mass index is 27.31 kg/m.   Physical Exam Vitals and nursing note reviewed.  Constitutional:      Appearance: She is well-developed.  HENT:     Head: Normocephalic and atraumatic.     Mouth/Throat:     Pharynx: No oropharyngeal exudate.  Eyes:     General: No scleral icterus.    Conjunctiva/sclera: Conjunctivae normal.     Pupils: Pupils are equal, round, and reactive to light.  Cardiovascular:     Rate and Rhythm: Normal rate and regular rhythm.     Heart sounds: Normal heart sounds. No murmur. No friction rub. No gallop.   Pulmonary:     Effort: Pulmonary effort is normal.     Breath sounds: Normal breath sounds. No wheezing or rales.  Musculoskeletal:     Cervical back: Neck supple.     Right lower leg: No edema.     Left lower leg: No edema.  Skin:    General: Skin is warm and dry.  Neurological:     Mental Status: She is alert and oriented to person, place, and time.     No results found for this or any previous visit (from the past 24 hour(s)).  No results found.   ASSESSMENT and PLAN  1. Type 2 diabetes mellitus with diabetic polyneuropathy, without long-term current use of insulin ([place]) Checking labs today, medications will be adjusted as needed.  - Hemoglobin A1c  2. Essential hypertension Controlled. Continue current regime.  - Comprehensive metabolic panel  3. Mixed hyperlipidemia Checking labs today, medications will be adjusted as needed.  - Lipid panel  4. Chronic right shoulder pain OA per xray in  2019 - refilled Voltaren gel  Other orders - diltiazem (TIAZAC) 360 MG 24 hr capsule - CYCLOGYL 2 % ophthalmic solution; Place 1 drop into the right eye 3 (three) times daily. - brimonidine (ALPHAGAN) 0.2 % ophthalmic  solution; Place 1 drop into the right eye 2 (two) times daily. - ketorolac (ACULAR) 0.5 % ophthalmic solution; Place 1 drop into the right eye 4 (four) times daily. - diclofenac Sodium (VOLTAREN) 1 % GEL; Apply 4 g topically 4 (four) times daily.  Return in about 3 months (around 02/07/2020).    Rutherford Guys, MD [facility] [address], [place] [address] Ph.  [phone] Fax [phone]

## 2019-11-09 NOTE — Patient Instructions (Signed)
° ° ° °  If you have lab work done today you will be contacted with your lab results within the next 2 weeks.  If you have not heard from us then please contact us. The fastest way to get your results is to register for My Chart. ° ° °IF you received an x-ray today, you will receive an invoice from [facility]. Please contact [facility] at [phone] with questions or concerns regarding your invoice.  ° °IF you received labwork today, you will receive an invoice from [facility]. Please contact [facility] at [phone] with questions or concerns regarding your invoice.  ° °Our billing staff will not be able to assist you with questions regarding bills from these companies. ° °You will be contacted with the lab results as soon as they are available. The fastest way to get your results is to activate your My Chart account. Instructions are located on the last page of this paperwork. If you have not heard from us regarding the results in 2 weeks, please contact this office. °  ° ° ° °

## 2019-11-10 LAB — LIPID PANEL
Chol/HDL Ratio: 5.7 ratio — ABNORMAL HIGH (ref 0.0–4.4)
Cholesterol, Total: 215 mg/dL — ABNORMAL HIGH (ref 100–199)
HDL: 38 mg/dL — ABNORMAL LOW (ref >39)
LDL Chol Calc (NIH): 131 mg/dL — ABNORMAL HIGH (ref 0–99)
Triglycerides: 258 mg/dL — ABNORMAL HIGH (ref 0–149)
VLDL Cholesterol Cal: 46 mg/dL — ABNORMAL HIGH (ref 5–40)

## 2019-11-10 LAB — HEMOGLOBIN A1C
Est. average glucose Bld gHb Est-mCnc: 154 mg/dL
Hgb A1c MFr Bld: 7 % — ABNORMAL HIGH (ref 4.8–5.6)

## 2019-11-10 LAB — COMPREHENSIVE METABOLIC PANEL
ALT: 13 IU/L (ref 0–32)
AST: 17 IU/L (ref 0–40)
Albumin/Globulin Ratio: 1.6 (ref 1.2–2.2)
Albumin: 4.5 g/dL (ref 3.6–4.6)
Alkaline Phosphatase: 72 IU/L (ref 39–117)
BUN/Creatinine Ratio: 15 (ref 12–28)
BUN: 17 mg/dL (ref 8–27)
Bilirubin Total: 0.5 mg/dL (ref 0.0–1.2)
CO2: 24 mmol/L (ref 20–29)
Calcium: 9.6 mg/dL (ref 8.7–10.3)
Chloride: 103 mmol/L (ref 96–106)
Creatinine, Ser: 1.11 mg/dL — ABNORMAL HIGH (ref 0.57–1.00)
GFR calc Af Amer: 54 mL/min/{1.73_m2} — ABNORMAL LOW (ref >59)
GFR calc non Af Amer: 47 mL/min/{1.73_m2} — ABNORMAL LOW (ref >59)
Globulin, Total: 2.8 g/dL (ref 1.5–4.5)
Glucose: 87 mg/dL (ref 65–99)
Potassium: 4 mmol/L (ref 3.5–5.2)
Sodium: 144 mmol/L (ref 134–144)
Total Protein: 7.3 g/dL (ref 6.0–8.5)

## 2019-11-12 ENCOUNTER — Encounter: Payer: Self-pay | Admitting: Radiology

## 2019-11-12 DIAGNOSIS — Z789 Other specified health status: Secondary | ICD-10-CM | POA: Insufficient documentation

## 2019-11-18 DIAGNOSIS — H2512 Age-related nuclear cataract, left eye: Secondary | ICD-10-CM | POA: Diagnosis not present

## 2019-11-18 DIAGNOSIS — H25012 Cortical age-related cataract, left eye: Secondary | ICD-10-CM | POA: Diagnosis not present

## 2019-11-24 DIAGNOSIS — H25812 Combined forms of age-related cataract, left eye: Secondary | ICD-10-CM | POA: Diagnosis not present

## 2019-11-24 DIAGNOSIS — H2512 Age-related nuclear cataract, left eye: Secondary | ICD-10-CM | POA: Diagnosis not present

## 2019-11-24 DIAGNOSIS — H25012 Cortical age-related cataract, left eye: Secondary | ICD-10-CM | POA: Diagnosis not present

## 2019-11-29 IMAGING — CT CT ABD-PELV W/ CM
2 of 5 series · 16 of 46 positions shown, 18 images · IV contrast (iopamidol)
Comparison: 08/02/2017

CLINICAL DATA: Pt c/o generalized abdominal pain for 3-4 weeks,
also has pain with urination. Feels pain in vagina and in her back.
Has had Hyster in 6249 She has one kidney, had her gallbladder
removed in [REDACTED]. C/o nausea without vomiting. Denies
diarrhea

EXAM:
CT ABDOMEN AND PELVIS WITH CONTRAST
TECHNIQUE: Multidetector CT imaging of the abdomen and pelvis was performed
using the standard protocol following bolus administration of
intravenous contrast.
CONTRAST:  75mL JOOTVY-RFF IOPAMIDOL (JOOTVY-RFF) INJECTION 61%

[Series 3: a/p w/ 5mm · axial · 0.62mm/px · z∈[+789,+1159]mm · 13 of 84 slices shown, 15 images]
[im 5/84  soft-tissue]
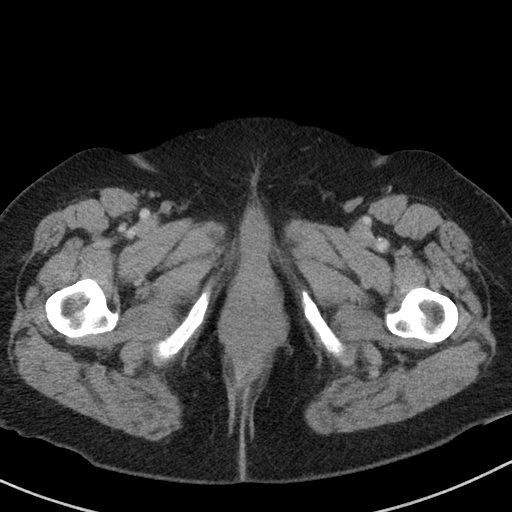
[im 5/84  bone]
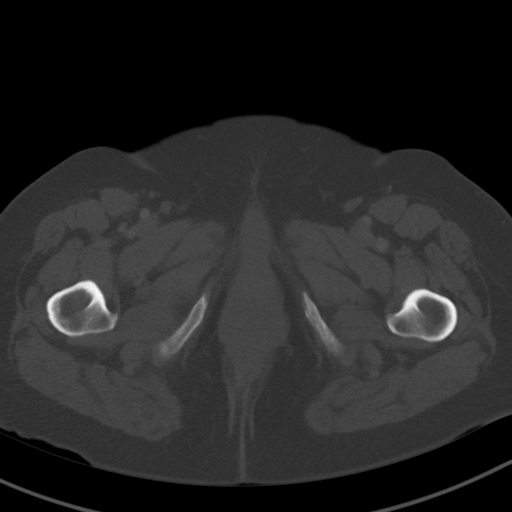
[im 13/84  soft-tissue]
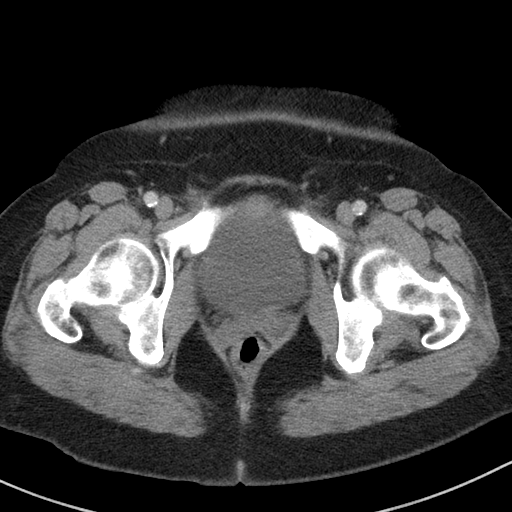
[im 17/84  soft-tissue]
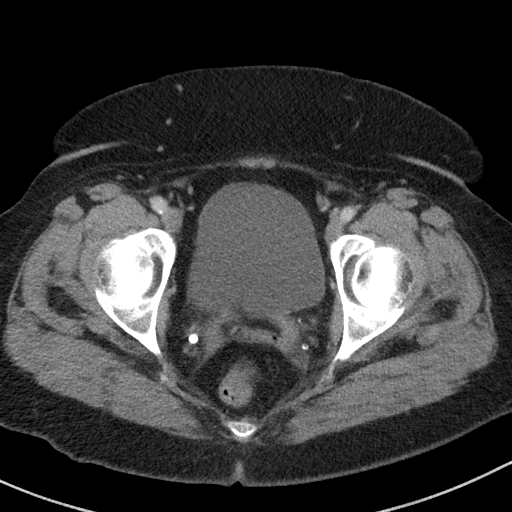
[im 25/84  soft-tissue]
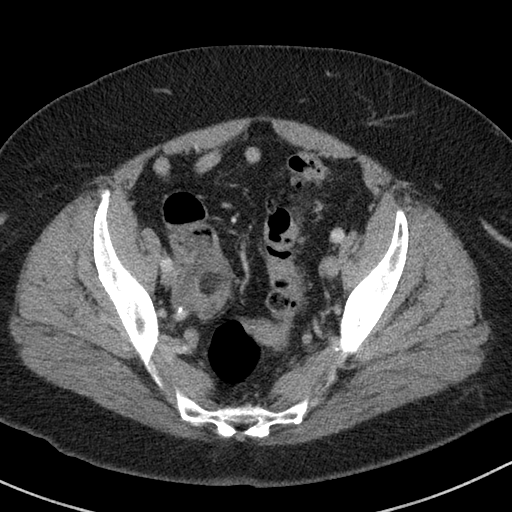
[im 30/84  soft-tissue]
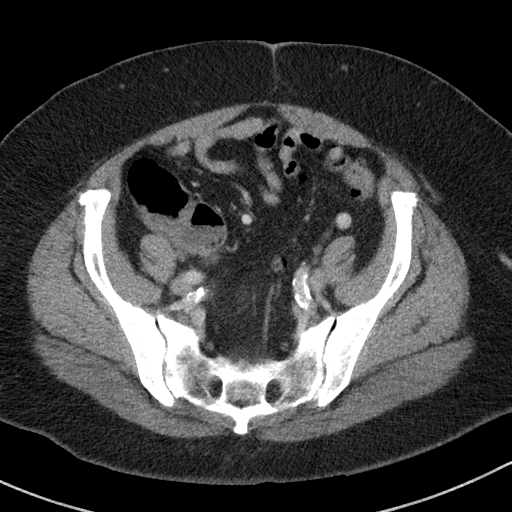
[im 38/84  soft-tissue]
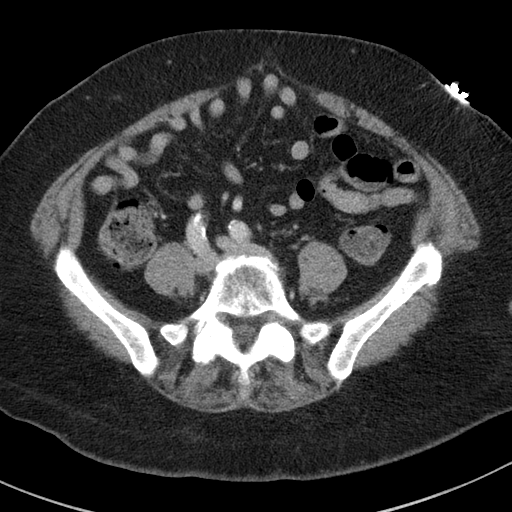
[im 42/84  soft-tissue]
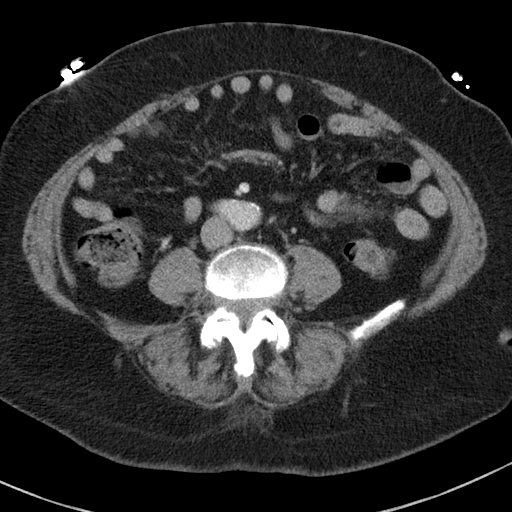
[im 46/84  soft-tissue]
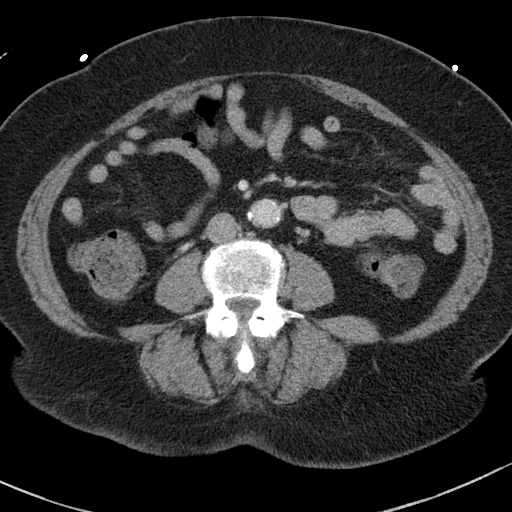
[im 54/84  soft-tissue]
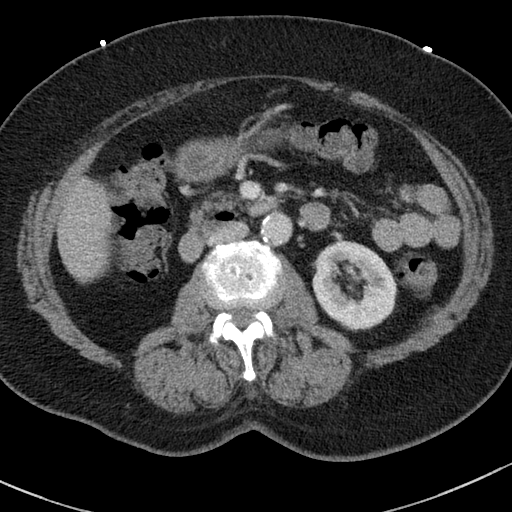
[im 54/84  bone]
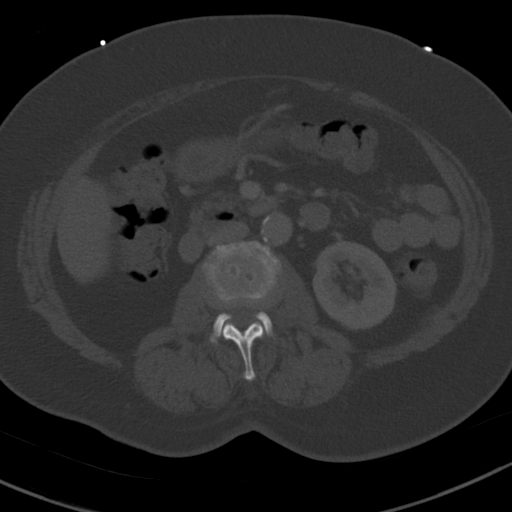
[im 59/84  soft-tissue]
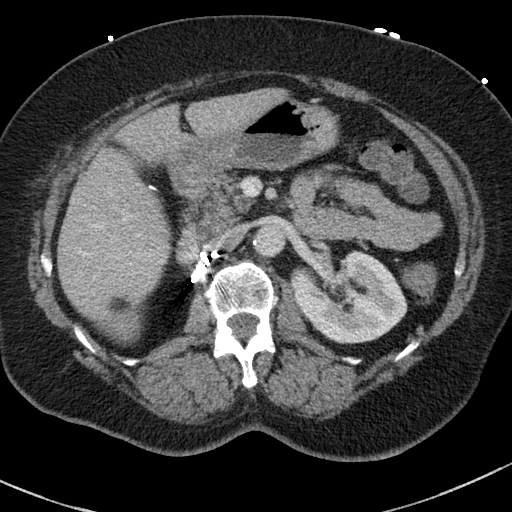
[im 67/84  soft-tissue]
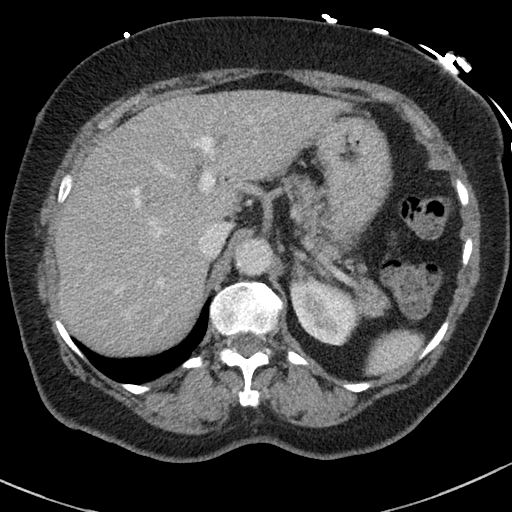
[im 71/84  soft-tissue]
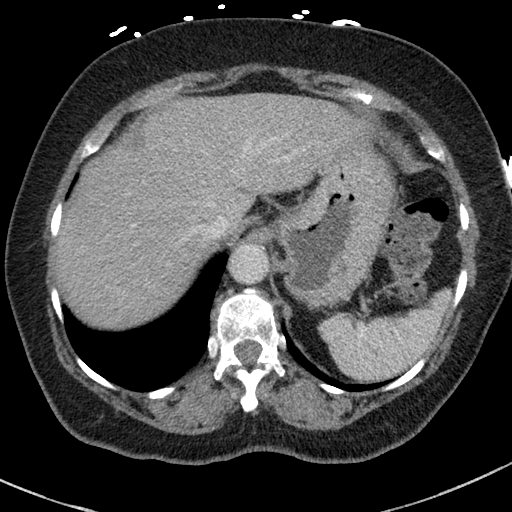
[im 79/84  soft-tissue]
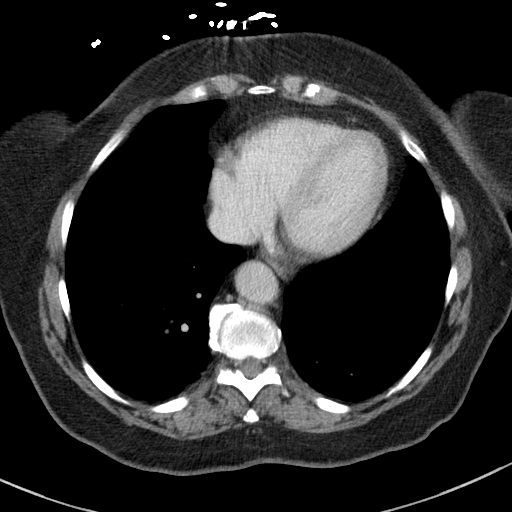

[Series 6: a/p w/ cor · coronal · 0.67mm/px · 3 of 151 slices shown]
[im 51/151  soft-tissue]
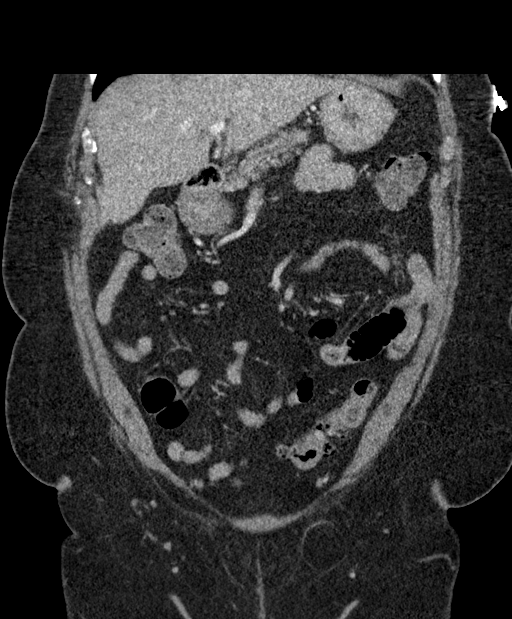
[im 67/151  soft-tissue]
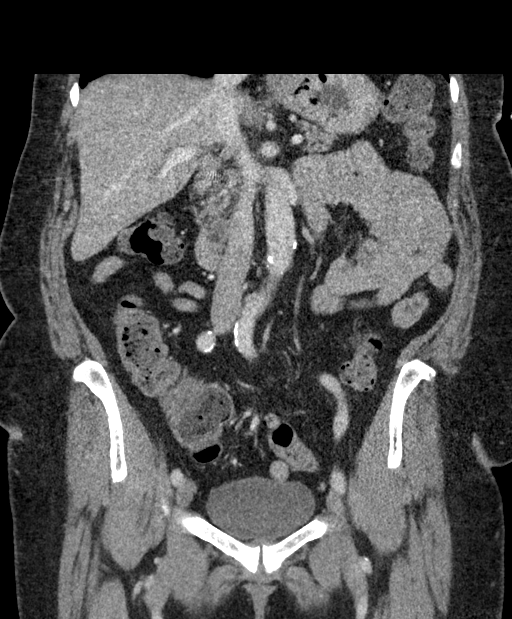
[im 84/151  soft-tissue]
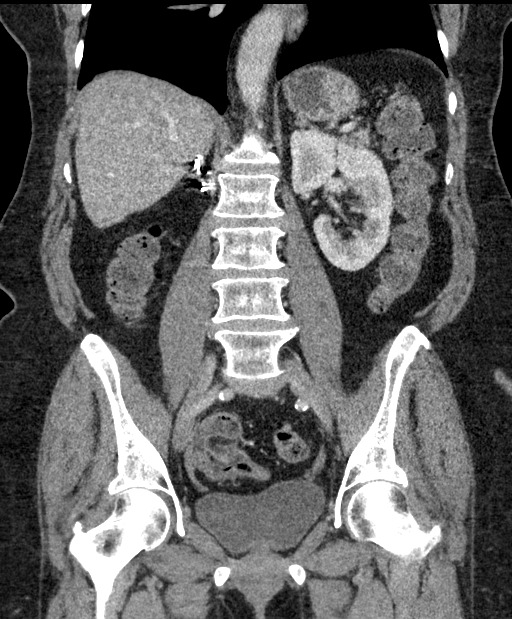

[16 of 46 positions shown; findings below may reference images not displayed]

FINDINGS: Lower chest: No acute abnormality.

Hepatobiliary: No focal liver abnormality is seen. Status post
cholecystectomy. No biliary dilatation.

Pancreas: Unremarkable. No pancreatic ductal dilatation or
surrounding inflammatory changes.

Spleen: Normal in size without focal abnormality.

Adrenals/Urinary Tract: Status post right nephrectomy. No adrenal
masses. Left kidney normal in size, orientation and position with
homogeneous enhancement and normal excretion. No left renal masses,
stones or hydronephrosis. Normal left ureter. Bladder is
unremarkable.

Stomach/Bowel: Stomach and small bowel are within normal limits.
Colon is normal in caliber. There are left colon diverticula without
evidence of diverticulitis. Colon otherwise unremarkable. Appendix
not visualized.

Vascular/Lymphatic: Aortic atherosclerosis. No enlarged abdominal or
pelvic lymph nodes.

Reproductive: Status post hysterectomy. No adnexal masses.

Other: No ascites.

Musculoskeletal: No fracture or acute finding. No osteoblastic or
osteolytic lesions.
IMPRESSION: 1. No acute findings within the abdomen or pelvis.
2. Status post cholecystectomy, right nephrectomy and hysterectomy.
3. Left colon diverticula.  No diverticulitis.
4. Aortic atherosclerosis.

## 2019-12-08 ENCOUNTER — Other Ambulatory Visit: Payer: Self-pay

## 2019-12-08 ENCOUNTER — Emergency Department (HOSPITAL_COMMUNITY)
Admission: EM | Admit: 2019-12-08 | Discharge: 2019-12-09 | Disposition: A | Discharge status: Home / Self Care | Payer: Medicare HMO | Attending: Emergency Medicine | Admitting: Emergency Medicine

## 2019-12-08 ENCOUNTER — Encounter (HOSPITAL_COMMUNITY): Payer: Self-pay | Admitting: Emergency Medicine

## 2019-12-08 DIAGNOSIS — I5032 Chronic diastolic (congestive) heart failure: Secondary | ICD-10-CM | POA: Insufficient documentation

## 2019-12-08 DIAGNOSIS — I130 Hypertensive heart and chronic kidney disease with heart failure and stage 1 through stage 4 chronic kidney disease, or unspecified chronic kidney disease: Secondary | ICD-10-CM | POA: Diagnosis not present

## 2019-12-08 DIAGNOSIS — I110 Hypertensive heart disease with heart failure: Secondary | ICD-10-CM | POA: Diagnosis not present

## 2019-12-08 DIAGNOSIS — R109 Unspecified abdominal pain: Secondary | ICD-10-CM | POA: Diagnosis not present

## 2019-12-08 DIAGNOSIS — R1084 Generalized abdominal pain: Secondary | ICD-10-CM | POA: Diagnosis not present

## 2019-12-08 DIAGNOSIS — F17210 Nicotine dependence, cigarettes, uncomplicated: Secondary | ICD-10-CM | POA: Insufficient documentation

## 2019-12-08 DIAGNOSIS — Z85528 Personal history of other malignant neoplasm of kidney: Secondary | ICD-10-CM | POA: Diagnosis not present

## 2019-12-08 DIAGNOSIS — I10 Essential (primary) hypertension: Secondary | ICD-10-CM | POA: Diagnosis not present

## 2019-12-08 DIAGNOSIS — N183 Chronic kidney disease, stage 3 unspecified: Secondary | ICD-10-CM | POA: Insufficient documentation

## 2019-12-08 DIAGNOSIS — R1030 Lower abdominal pain, unspecified: Secondary | ICD-10-CM | POA: Insufficient documentation

## 2019-12-08 DIAGNOSIS — E039 Hypothyroidism, unspecified: Secondary | ICD-10-CM | POA: Diagnosis not present

## 2019-12-08 DIAGNOSIS — R195 Other fecal abnormalities: Secondary | ICD-10-CM | POA: Insufficient documentation

## 2019-12-08 DIAGNOSIS — R609 Edema, unspecified: Secondary | ICD-10-CM | POA: Diagnosis not present

## 2019-12-08 LAB — COMPREHENSIVE METABOLIC PANEL
ALT: 16 U/L (ref 0–44)
AST: 35 U/L (ref 15–41)
Albumin: 4 g/dL (ref 3.5–5.0)
Alkaline Phosphatase: 64 U/L (ref 38–126)
Anion gap: 9 (ref 5–15)
BUN: 21 mg/dL (ref 8–23)
CO2: 29 mmol/L (ref 22–32)
Calcium: 9.5 mg/dL (ref 8.9–10.3)
Chloride: 101 mmol/L (ref 98–111)
Creatinine, Ser: 1.35 mg/dL — ABNORMAL HIGH (ref 0.44–1.00)
GFR calc Af Amer: 43 mL/min — ABNORMAL LOW (ref >60)
GFR calc non Af Amer: 37 mL/min — ABNORMAL LOW (ref >60)
Glucose, Bld: 241 mg/dL — ABNORMAL HIGH (ref 70–99)
Potassium: 5.2 mmol/L — ABNORMAL HIGH (ref 3.5–5.1)
Sodium: 139 mmol/L (ref 135–145)
Total Bilirubin: 1.1 mg/dL (ref 0.3–1.2)
Total Protein: 7 g/dL (ref 6.5–8.1)

## 2019-12-08 LAB — LIPASE, BLOOD: Lipase: 36 U/L (ref 11–51)

## 2019-12-08 LAB — URINALYSIS, ROUTINE W REFLEX MICROSCOPIC
Bacteria, UA: NONE SEEN
Bilirubin Urine: NEGATIVE
Glucose, UA: >=500 mg/dL — AB
Hgb urine dipstick: NEGATIVE
Ketones, ur: NEGATIVE mg/dL
Leukocytes,Ua: NEGATIVE
Nitrite: NEGATIVE
Protein, ur: NEGATIVE mg/dL
Specific Gravity, Urine: 1.01 (ref 1.005–1.030)
pH: 6 (ref 5.0–8.0)

## 2019-12-08 LAB — CBC
HCT: 42.2 % (ref 36.0–46.0)
Hemoglobin: 13.5 g/dL (ref 12.0–15.0)
MCH: 31.6 pg (ref 26.0–34.0)
MCHC: 32 g/dL (ref 30.0–36.0)
MCV: 98.8 fL (ref 80.0–100.0)
Platelets: 237 10*3/uL (ref 150–400)
RBC: 4.27 MIL/uL (ref 3.87–5.11)
RDW: 13.7 % (ref 11.5–15.5)
WBC: 6.1 10*3/uL (ref 4.0–10.5)
nRBC: 0 % (ref 0.0–0.2)

## 2019-12-08 MED ORDER — SODIUM CHLORIDE 0.9% FLUSH: 3.0000 mL | Freq: Once | INTRAVENOUS | Status: DC

## 2019-12-08 NOTE — ED Triage Notes (Signed)
Pt endorses lower abdominal pain and urine retention since  0200 with bright red stools (diarrhea). Pain 8/10 denies radiation to Hx of polyps removal. VSS at triage NAD.     155/90 80 p 99% ra 98.0 temp

## 2019-12-09 ENCOUNTER — Emergency Department (HOSPITAL_COMMUNITY): Payer: Medicare HMO

## 2019-12-09 DIAGNOSIS — R109 Unspecified abdominal pain: Secondary | ICD-10-CM | POA: Diagnosis not present

## 2019-12-09 LAB — PROTIME-INR
INR: 1 (ref 0.8–1.2)
Prothrombin Time: 12.7 seconds (ref 11.4–15.2)

## 2019-12-09 LAB — CBC WITH DIFFERENTIAL/PLATELET
Abs Immature Granulocytes: 0.04 10*3/uL (ref 0.00–0.07)
Basophils Absolute: 0.1 10*3/uL (ref 0.0–0.1)
Basophils Relative: 1 %
Eosinophils Absolute: 0.1 10*3/uL (ref 0.0–0.5)
Eosinophils Relative: 1 %
HCT: 43.2 % (ref 36.0–46.0)
Hemoglobin: 13.9 g/dL (ref 12.0–15.0)
Immature Granulocytes: 1 %
Lymphocytes Relative: 35 %
Lymphs Abs: 2.9 10*3/uL (ref 0.7–4.0)
MCH: 31.4 pg (ref 26.0–34.0)
MCHC: 32.2 g/dL (ref 30.0–36.0)
MCV: 97.7 fL (ref 80.0–100.0)
Monocytes Absolute: 0.7 10*3/uL (ref 0.1–1.0)
Monocytes Relative: 8 %
Neutro Abs: 4.6 10*3/uL (ref 1.7–7.7)
Neutrophils Relative %: 54 %
Platelets: 197 10*3/uL (ref 150–400)
RBC: 4.42 MIL/uL (ref 3.87–5.11)
RDW: 13.8 % (ref 11.5–15.5)
WBC: 8.4 10*3/uL (ref 4.0–10.5)
nRBC: 0 % (ref 0.0–0.2)

## 2019-12-09 LAB — BASIC METABOLIC PANEL
Anion gap: 12 (ref 5–15)
BUN: 19 mg/dL (ref 8–23)
CO2: 24 mmol/L (ref 22–32)
Calcium: 9.7 mg/dL (ref 8.9–10.3)
Chloride: 103 mmol/L (ref 98–111)
Creatinine, Ser: 1.35 mg/dL — ABNORMAL HIGH (ref 0.44–1.00)
GFR calc Af Amer: 43 mL/min — ABNORMAL LOW (ref >60)
GFR calc non Af Amer: 37 mL/min — ABNORMAL LOW (ref >60)
Glucose, Bld: 157 mg/dL — ABNORMAL HIGH (ref 70–99)
Potassium: 4.5 mmol/L (ref 3.5–5.1)
Sodium: 139 mmol/L (ref 135–145)

## 2019-12-09 MED ORDER — SODIUM CHLORIDE 0.9 % IV BOLUS
1000.0000 mL | Freq: Once | INTRAVENOUS | Status: AC
  Administered 2019-12-09: 1000 mL via INTRAVENOUS

## 2019-12-09 MED ORDER — IOHEXOL 300 MG/ML  SOLN
75.0000 mL | Freq: Once | INTRAMUSCULAR | Status: AC | PRN
  Administered 2019-12-09: 75 mL via INTRAVENOUS

## 2019-12-09 MED ORDER — FENTANYL CITRATE (PF) 100 MCG/2ML IJ SOLN
50.0000 ug | Freq: Once | INTRAMUSCULAR | Status: AC
  Administered 2019-12-09: 50 ug via INTRAVENOUS
  Filled 2019-12-09: qty 2

## 2019-12-09 NOTE — Discharge Instructions (Addendum)
Follow-up with your primary care doctor and GI.  Lab work is unremarkable.  CT scan of the abdomen and pelvis is normal.  Return to the ED if you have increasing severity of blood in his stool.

## 2019-12-09 NOTE — ED Provider Notes (Signed)
Patient presents to the ED with abdominal pain.  Some red stool.  Awaiting CT scan at time of handoff from Dr. Dayna Barker.  Patient has been here for about 13 to 15 hours.  Has had 2 CBCs with stable hemoglobin in the 13's.  No bright red blood since she has been here.  Has a history of may be some diverticulitis and will evaluate on CT scan.  Otherwise lab work is unremarkable.  CT scan abdomen pelvis is unremarkable.  Recommend follow-up with GI.  Told to return to the ED symptoms worsen.  She states that she has a history of polyps and just recently had a colonoscopy.  Possibly had some bleeding from a polyp but it appears to have been resolved and otherwise she is hemodynamically stable with 2 unremarkable hemoglobins.  Given return precautions and discharged from the ED in good condition.  This chart was dictated using voice recognition software.  Despite best efforts to proofread,  errors can occur which can change the documentation meaning.     Lennice Sites, DO 12/09/19 [phone]

## 2019-12-09 NOTE — ED Notes (Signed)
Patient verbalizes understanding of discharge instructions. Opportunity for questioning and answers were provided. Armband removed by staff, pt discharged from ED to home via POV with family 

## 2019-12-09 NOTE — ED Provider Notes (Signed)
Emergency Department Provider Note   I have reviewed the triage vital signs and the nursing notes.   HISTORY  Chief Complaint GI Bleeding and Abdominal Pain   HPI Sherri Burns is a 84 y.o. female with multiple medical problems as documented below who presents to the emergency department today with GI bleeding.  Patient states she initially had some suprapubic pain with the but that improved after 16-hour wait in the emergency room waiting room.  She states she had issues with GI bleeding in the past and is found to be related to diverticulosis.  She is had multiple polyps removed in the past by her GI doctor.  She states that this episode started this morning.  She states that she had another bowel movement while she was urinating and it was just route with blood at that time.  No other bleeding.  No lightheadedness or dizziness.  No chest pain or shortness of breath.  No skin color changes.   No other associated or modifying symptoms.    Past Medical History:  Diagnosis Date  . Abnormal EKG 02/07/2016   Inferolateral T wave inversion and ST depression.  . Acute calculous cholecystitis   . Anxiety   . Arthritis   . Chronic diastolic CHF (congestive heart failure) ([place]) 02/21/2018  . Colon polyps 11/2008   tubular adenoma  . Diabetes mellitus   . Esophageal problem    esophageal dilation  . GERD (gastroesophageal reflux disease)    + hpylori  . GI bleeding 01/2018  . Headache   . Hyperlipidemia   . Hypertension   . Hypothyroidism   . Ischemic colitis ([place])   . Renal cancer, right ([place]) 2010   s/p Rt nephrectomy by Dr. Rosana Hoes  . Renal insufficiency     Patient Active Problem List   Diagnosis Date Noted  . Statin intolerance 11/12/2019  . GAD (generalized anxiety disorder) 08/07/2019  . Long term prescription benzodiazepine use 08/07/2019  . GI bleed 04/10/2019  . S/p nephrectomy 06/24/2018  . Chronic diastolic CHF (congestive heart failure) ([place]) 02/21/2018  .  Acute lower GI bleeding 02/20/2018  . Hyperlipidemia   . Abnormal EKG 02/07/2016  . Type 2 diabetes mellitus with diabetic polyneuropathy, without long-term current use of insulin ([place]) 09/23/2012  . Essential hypertension   . Hypothyroidism   . CKD (chronic kidney disease), stage III     Past Surgical History:  Procedure Laterality Date  . ABDOMINAL HYSTERECTOMY  1978   partial  . CATARACT EXTRACTION    . CHOLECYSTECTOMY N/A 04/04/2016   Procedure: LAPAROSCOPIC CHOLECYSTECTOMY;  Surgeon: Autumn Messing III, MD;  Location: [facility];  Service: General;  Laterality: N/A;  . COLONOSCOPY    . ESOPHAGEAL DILATION  2016  . HERNIA REPAIR    . LAPAROSCOPIC CHOLECYSTECTOMY  04/04/2016  . NEPHRECTOMY Right 2010   Dr. Rosana Hoes, urology, due to renal cancer    Current Outpatient Rx  . Order #: [id number] Class: Historical Med  . Order #: [id number] Class: Normal  . Order #: [id number] Class: Historical Med  . Order #: [id number] Class: Historical Med  . Order #: [id number] Class: Historical Med  . Order #: [id number] Class: Normal  . Order #: [id number] Class: Historical Med  . Order #: [id number] Class: Historical Med  . Order #: [id number] Class: Normal  . Order #: [id number] Class: Historical Med  . Order #: [id number] Class: Normal  . Order #: [id number] Class: Normal  . Order #: [id number] Class: Normal  . Order #: [id number] Class: Normal  . Order #: [id number] Class: Historical Med  .  Order #: [id number] Class: Historical Med  . Order #: [id number] Class: Normal  . Order #: [id number] Class: Normal  . Order #: [id number] Class: Historical Med  . Order #: [id number] Class: Normal  . Order #: [id number] Class: Normal  . Order #: [id number] Class: Historical Med  . Order #: [id number] Class: Historical Med  . Order #: [id number] Class: Normal    Allergies Ace inhibitors, Codeine, Metformin and related, Ciprocin-fluocin-procin [fluocinolone acetonide], Clonidine derivatives, Crestor [rosuvastatin calcium], Penicillins, Simvastatin,  and Tessalon perles  Family History  Problem Relation Age of Onset  . Cancer Mother        colon, kidney cancer  . Cancer Father        throat cancer  . Cancer Sister   . Heart attack Brother     Social History Social History   Tobacco Use  . Smoking status: Current Some Day Smoker    Packs/day: 0.25    Years: 55.00    Pack years: 13.75    Types: Cigarettes  . Smokeless tobacco: Never Used  . Tobacco comment: cut back amount still  trying to quit  Substance Use Topics  . Alcohol use: No    Alcohol/week: 0.0 standard drinks  . Drug use: No    Review of Systems  All other systems negative except as documented in the HPI. All pertinent positives and negatives as reviewed in the HPI. ____________________________________________   PHYSICAL EXAM:  VITAL SIGNS: ED Triage Vitals  Enc Vitals Group     BP 12/08/19 1254 (!) 144/80     Pulse Rate 12/08/19 1254 75     Resp 12/08/19 1240 16     Temp 12/08/19 1254 98.1 F (36.7 C)     Temp Source 12/08/19 1240 Oral     SpO2 12/08/19 1254 98 %    Constitutional: Alert and oriented. Well appearing and in no acute distress. Eyes: Conjunctivae are normal. PERRL. EOMI. Head: Atraumatic. Nose: No congestion/rhinnorhea. Mouth/Throat: Mucous membranes are moist.  Oropharynx non-erythematous. Neck: No stridor.  No meningeal signs.   Cardiovascular: Normal rate, regular rhythm. Good peripheral circulation. Grossly normal heart sounds.   Respiratory: Normal respiratory effort.  No retractions. Lungs CTAB. Gastrointestinal: Soft and mild suprapubic ttp. No distention.  Musculoskeletal: No lower extremity tenderness nor edema. No gross deformities of extremities. Neurologic:  Normal speech and language. No gross focal neurologic deficits are appreciated.  Skin:  Skin is warm, dry and intact. No rash noted.   ____________________________________________   LABS (all labs ordered are listed, but only abnormal results are  displayed)  Labs Reviewed  URINALYSIS, ROUTINE W REFLEX MICROSCOPIC - Abnormal; Notable for the following components:      Result Value   Glucose, UA >=500 (*)    All other components within normal limits  COMPREHENSIVE METABOLIC PANEL - Abnormal; Notable for the following components:   Potassium 5.2 (*)    Glucose, Bld 241 (*)    Creatinine, Ser 1.35 (*)    GFR calc non Af Amer 37 (*)    GFR calc Af Amer 43 (*)    All other components within normal limits  BASIC METABOLIC PANEL - Abnormal; Notable for the following components:   Glucose, Bld 157 (*)    Creatinine, Ser 1.35 (*)    GFR calc non Af Amer 37 (*)    GFR calc Af Amer 43 (*)    All other components within normal limits  LIPASE, BLOOD  CBC  PROTIME-INR  CBC WITH DIFFERENTIAL/PLATELET   ____________________________________________  EKG   EKG Interpretation  Date/Time:  Wednesday December 09 2019 06:15:03 EST Ventricular Rate:  75 PR Interval:    QRS Duration: 105 QT Interval:  393 QTC Calculation: 439 R Axis:   -10 Text Interpretation: Sinus rhythm Left ventricular hypertrophy Nonspecific T abnormalities, diffuse leads No significant change since last tracing or multiple previous Confirmed by Merrily Pew [phone]) on 12/09/2019 6:57:25 AM       ____________________________________________  RADIOLOGY  No results found.  ____________________________________________   PROCEDURES  Procedure(s) performed:   Procedures   ____________________________________________   INITIAL IMPRESSION / ASSESSMENT AND PLAN / ED COURSE  Patient had been here for a very prolonged period so repeat CBC done without a change in her hemoglobin.  Her potassium was initially low but elevated without repeated and was normal as well.  EKG in relation to the hyperkalemia was unchanged.  She denies any more bowel movements here but she does have tenderness to her abdomen.  We will get a CT scan by think if this is okay she can be  discharged to follow-up with her gastroenterologist.  Care was transferred pending CT and reevaluation with likely discharge.   Pertinent labs & imaging results that were available during my care of the patient were reviewed by me and considered in my medical decision making (see chart for details).  ____________________________________________  FINAL CLINICAL IMPRESSION(S) / ED DIAGNOSES  Final diagnoses:  None     MEDICATIONS GIVEN DURING THIS VISIT:  Medications  sodium chloride flush (NS) 0.9 % injection 3 mL (has no administration in time range)  sodium chloride 0.9 % bolus 1,000 mL (1,000 mLs Intravenous New Bag/Given 12/09/19 0558)  fentaNYL (SUBLIMAZE) injection 50 mcg (50 mcg Intravenous Given 12/09/19 0601)     NEW OUTPATIENT MEDICATIONS STARTED DURING THIS VISIT:  New Prescriptions   No medications on file    Note:  This note was prepared with assistance of Dragon voice recognition software. Occasional wrong-word or sound-a-like substitutions may have occurred due to the inherent limitations of voice recognition software.   Isadora Delorey, Corene Cornea, MD 12/09/19 [phone]

## 2019-12-09 NOTE — ED Notes (Signed)
Pt is able to reach Glendell Docker (dauhter's signif other), he will be picking her up

## 2019-12-15 ENCOUNTER — Other Ambulatory Visit: Payer: Self-pay | Admitting: Gastroenterology

## 2019-12-29 ENCOUNTER — Other Ambulatory Visit: Payer: Self-pay | Admitting: Family Medicine

## 2019-12-29 NOTE — Telephone Encounter (Signed)
Requested Prescriptions  Pending Prescriptions Disp Refills  . glipiZIDE (GLUCOTROL) 10 MG tablet [Pharmacy Med Name: GLIPIZIDE 10MG  TAB] 360 tablet 0    Sig: TAKE TWO TABLETS BY MOUTH TWICE A DAY     Endocrinology:  Diabetes - Sulfonylureas Passed - 12/29/2019  3:22 PM      Passed - HBA1C is between 0 and 7.9 and within 180 days    Hgb A1c MFr Bld  Date Value Ref Range Status  11/09/2019 7.0 (H) 4.8 - 5.6 % Final    Comment:             Prediabetes: 5.7 - 6.4          Diabetes: >6.4          Glycemic control for adults with diabetes: <7.0          Passed - Valid encounter within last 6 months    Recent Outpatient Visits          1 month ago Type 2 diabetes mellitus with diabetic polyneuropathy, without long-term current use of insulin ([place])   [facility], Lilia Argue, MD   2 months ago Medicare annual wellness visit, subsequent   [facility], Lilia Argue, MD   3 months ago Diarrhea of presumed infectious origin   [facility], Lilia Argue, MD   4 months ago Essential hypertension   [facility], Lilia Argue, MD   6 months ago Hematemesis with nausea   [facility], Lilia Argue, MD      Future Appointments            In 1 month Rutherford Guys, MD Primary Care at [place], [facility]

## 2020-01-28 ENCOUNTER — Other Ambulatory Visit: Payer: Self-pay | Admitting: Family Medicine

## 2020-01-28 DIAGNOSIS — I5032 Chronic diastolic (congestive) heart failure: Secondary | ICD-10-CM

## 2020-01-28 NOTE — Telephone Encounter (Signed)
Requested medication (s) are due for refill today: yes  Requested medication (s) are on the active medication list: yes  Last refill:  01/13/20  Future visit scheduled: yes  Notes to clinic:  not delegated    Requested Prescriptions  Pending Prescriptions Disp Refills   clonazePAM (KLONOPIN) 1 MG tablet [Pharmacy Med Name: CLONAZEPAM 1MG  TAB] 45 tablet 4    Sig: TAKE HALF TABLET BY MOUTH EVERY MORNING AND TAKE ONE TABLET BY MOUTH AT BEDTIME      Not Delegated - Psychiatry:  Anxiolytics/Hypnotics Failed - 01/28/2020 10:55 AM      Failed - This refill cannot be delegated      Failed - Urine Drug Screen completed in last 360 days.      Passed - Valid encounter within last 6 months    Recent Outpatient Visits           2 months ago Type 2 diabetes mellitus with diabetic polyneuropathy, without long-term current use of insulin ([place])   [facility], Lilia Argue, MD   3 months ago Medicare annual wellness visit, subsequent   [facility], Lilia Argue, MD   4 months ago Diarrhea of presumed infectious origin   [facility], Lilia Argue, MD   5 months ago Essential hypertension   [facility], Lilia Argue, MD   7 months ago Hematemesis with nausea   [facility], Lilia Argue, MD       Future Appointments             In 1 week Rutherford Guys, MD Primary Care at [place], [facility]             Signed Prescriptions Disp Refills   furosemide (LASIX) 40 MG tablet 90 tablet 0    Sig: TAKE ONE TABLET BY MOUTH DAILY      Cardiovascular:  Diuretics - Loop Failed - 01/28/2020 10:55 AM      Failed - Cr in normal range and within 360 days    Creat  Date Value Ref Range Status  10/02/2016 1.45 (H) 0.60 - 0.93 mg/dL Final    Comment:      For patients > or = 82 years of age: The upper reference limit for Creatinine is approximately 13% higher for people identified as African-American.      Creatinine, Ser  Date  Value Ref Range Status  12/09/2019 1.35 (H) 0.44 - 1.00 mg/dL Final   Creatinine, Urine  Date Value Ref Range Status  10/02/2016 119 20 - 320 mg/dL Final          Failed - Last BP in normal range    BP Readings from Last 1 Encounters:  12/09/19 (!) 152/83          Passed - K in normal range and within 360 days    Potassium  Date Value Ref Range Status  12/09/2019 4.5 3.5 - 5.1 mmol/L Final          Passed - Ca in normal range and within 360 days    Calcium  Date Value Ref Range Status  12/09/2019 9.7 8.9 - 10.3 mg/dL Final          Passed - Na in normal range and within 360 days    Sodium  Date Value Ref Range Status  12/09/2019 139 135 - 145 mmol/L Final  11/09/2019 144 134 - 144 mmol/L Final  Passed - Valid encounter within last 6 months    Recent Outpatient Visits           2 months ago Type 2 diabetes mellitus with diabetic polyneuropathy, without long-term current use of insulin ([place])   [facility], Lilia Argue, MD   3 months ago Medicare annual wellness visit, subsequent   [facility], Lilia Argue, MD   4 months ago Diarrhea of presumed infectious origin   [facility], Lilia Argue, MD   5 months ago Essential hypertension   [facility], Lilia Argue, MD   7 months ago Hematemesis with nausea   [facility], Lilia Argue, MD       Future Appointments             In 1 week Rutherford Guys, MD Primary Care at [place], [facility]              metoprolol tartrate (LOPRESSOR) 100 MG tablet 180 tablet 0    Sig: TAKE ONE TABLET BY MOUTH TWICE A DAY      Cardiovascular:  Beta Blockers Failed - 01/28/2020 10:55 AM      Failed - Last BP in normal range    BP Readings from Last 1 Encounters:  12/09/19 (!) 152/83          Passed - Last Heart Rate in normal range    Pulse Readings from Last 1 Encounters:  12/09/19 84          Passed - Valid encounter within last 6 months     Recent Outpatient Visits           2 months ago Type 2 diabetes mellitus with diabetic polyneuropathy, without long-term current use of insulin ([place])   [facility], Lilia Argue, MD   3 months ago Medicare annual wellness visit, subsequent   [facility], Lilia Argue, MD   4 months ago Diarrhea of presumed infectious origin   [facility], Lilia Argue, MD   5 months ago Essential hypertension   [facility], Lilia Argue, MD   7 months ago Hematemesis with nausea   [facility], Lilia Argue, MD       Future Appointments             In 1 week Rutherford Guys, MD Primary Care at [place], [facility]

## 2020-01-28 NOTE — Telephone Encounter (Signed)
Requested Prescriptions  Pending Prescriptions Disp Refills  . clonazePAM (KLONOPIN) 1 MG tablet [Pharmacy Med Name: CLONAZEPAM 1MG  TAB] 45 tablet 4    Sig: TAKE HALF TABLET BY MOUTH EVERY MORNING AND TAKE ONE TABLET BY MOUTH AT BEDTIME     Not Delegated - Psychiatry:  Anxiolytics/Hypnotics Failed - 01/28/2020 10:55 AM      Failed - This refill cannot be delegated      Failed - Urine Drug Screen completed in last 360 days.      Passed - Valid encounter within last 6 months    Recent Outpatient Visits          2 months ago Type 2 diabetes mellitus with diabetic polyneuropathy, without long-term current use of insulin ([place])   [facility], Lilia Argue, MD   3 months ago Medicare annual wellness visit, subsequent   [facility], Lilia Argue, MD   4 months ago Diarrhea of presumed infectious origin   [facility], Lilia Argue, MD   5 months ago Essential hypertension   [facility], Lilia Argue, MD   7 months ago Hematemesis with nausea   [facility], Lilia Argue, MD      Future Appointments            In 1 week Rutherford Guys, MD Primary Care at [place], [facility]           . furosemide (LASIX) 40 MG tablet [Pharmacy Med Name: FUROSEMIDE 40MG  TAB] 90 tablet 0    Sig: TAKE ONE TABLET BY MOUTH DAILY     Cardiovascular:  Diuretics - Loop Failed - 01/28/2020 10:55 AM      Failed - Cr in normal range and within 360 days    Creat  Date Value Ref Range Status  10/02/2016 1.45 (H) 0.60 - 0.93 mg/dL Final    Comment:      For patients > or = 82 years of age: The upper reference limit for Creatinine is approximately 13% higher for people identified as African-American.      Creatinine, Ser  Date Value Ref Range Status  12/09/2019 1.35 (H) 0.44 - 1.00 mg/dL Final   Creatinine, Urine  Date Value Ref Range Status  10/02/2016 119 20 - 320 mg/dL Final         Failed - Last BP in normal range    BP Readings from  Last 1 Encounters:  12/09/19 (!) 152/83         Passed - K in normal range and within 360 days    Potassium  Date Value Ref Range Status  12/09/2019 4.5 3.5 - 5.1 mmol/L Final         Passed - Ca in normal range and within 360 days    Calcium  Date Value Ref Range Status  12/09/2019 9.7 8.9 - 10.3 mg/dL Final         Passed - Na in normal range and within 360 days    Sodium  Date Value Ref Range Status  12/09/2019 139 135 - 145 mmol/L Final  11/09/2019 144 134 - 144 mmol/L Final         Passed - Valid encounter within last 6 months    Recent Outpatient Visits          2 months ago Type 2 diabetes mellitus with diabetic polyneuropathy, without long-term current use of insulin ([place])   Primary Care at [place],  Lilia Argue, MD   3 months ago Medicare annual wellness visit, subsequent   [facility], Lilia Argue, MD   4 months ago Diarrhea of presumed infectious origin   [facility], Lilia Argue, MD   5 months ago Essential hypertension   [facility], Lilia Argue, MD   7 months ago Hematemesis with nausea   [facility], Lilia Argue, MD      Future Appointments            In 1 week Rutherford Guys, MD Primary Care at [facility], [facility]           . metoprolol tartrate (LOPRESSOR) 100 MG tablet [Pharmacy Med Name: METOPROLOL TARTRATE 100MG  TAB] 180 tablet 0    Sig: TAKE ONE TABLET BY MOUTH TWICE A DAY     Cardiovascular:  Beta Blockers Failed - 01/28/2020 10:55 AM      Failed - Last BP in normal range    BP Readings from Last 1 Encounters:  12/09/19 (!) 152/83         Passed - Last Heart Rate in normal range    Pulse Readings from Last 1 Encounters:  12/09/19 84         Passed - Valid encounter within last 6 months    Recent Outpatient Visits          2 months ago Type 2 diabetes mellitus with diabetic polyneuropathy, without long-term current use of insulin ([place])   [facility], Lilia Argue,  MD   3 months ago Medicare annual wellness visit, subsequent   [facility], Lilia Argue, MD   4 months ago Diarrhea of presumed infectious origin   [facility], Lilia Argue, MD   5 months ago Essential hypertension   [facility], Lilia Argue, MD   7 months ago Hematemesis with nausea   [facility], Lilia Argue, MD      Future Appointments            In 1 week Rutherford Guys, MD Primary Care at [place], [facility]

## 2020-01-28 NOTE — Telephone Encounter (Signed)
pmp reviewed  Med refilled 

## 2020-01-28 NOTE — Telephone Encounter (Signed)
Patient is requesting a refill of the following medications: Requested Prescriptions   Pending Prescriptions Disp Refills  . clonazePAM (KLONOPIN) 1 MG tablet [Pharmacy Med Name: CLONAZEPAM 1MG  TAB] 45 tablet 4    Sig: TAKE HALF TABLET BY MOUTH EVERY MORNING AND TAKE ONE TABLET BY MOUTH AT BEDTIME   Signed Prescriptions Disp Refills  . furosemide (LASIX) 40 MG tablet 90 tablet 0    Sig: TAKE ONE TABLET BY MOUTH DAILY    Authorizing Provider: Pamella Pert, [place] M    Ordering User: Sheran Spine K  . metoprolol tartrate (LOPRESSOR) 100 MG tablet 180 tablet 0    Sig: TAKE ONE TABLET BY MOUTH TWICE A DAY    Authorizing Provider: Tom Bean, [place]    Ordering User: Addison Naegeli    Date of patient request: 01/28/20 Last office visit: 11/09/2019 Date of last refill: 08/07/2019 Last refill amount: 45 tablets 5 refills Follow up time period per chart: appointment scheduled

## 2020-01-31 DIAGNOSIS — K219 Gastro-esophageal reflux disease without esophagitis: Secondary | ICD-10-CM | POA: Diagnosis not present

## 2020-01-31 DIAGNOSIS — I5030 Unspecified diastolic (congestive) heart failure: Secondary | ICD-10-CM | POA: Diagnosis not present

## 2020-01-31 DIAGNOSIS — I10 Essential (primary) hypertension: Secondary | ICD-10-CM | POA: Diagnosis not present

## 2020-02-02 ENCOUNTER — Other Ambulatory Visit: Payer: Self-pay | Admitting: Family Medicine

## 2020-02-02 DIAGNOSIS — Z9109 Other allergy status, other than to drugs and biological substances: Secondary | ICD-10-CM

## 2020-02-02 NOTE — Telephone Encounter (Signed)
Requested Prescriptions  Pending Prescriptions Disp Refills  . fluticasone (FLONASE) 50 MCG/ACT nasal spray [Pharmacy Med Name: FLUTICASONE PROP 50MCG/INH AER]  4    Sig: PLACE 1-2 SPRAYS INTO BOTH NOSTRILS AT BEDTIME.     Ear, Nose, and Throat: Nasal Preparations - Corticosteroids Passed - 02/02/2020 11:46 AM      Passed - Valid encounter within last 12 months    Recent Outpatient Visits          2 months ago Type 2 diabetes mellitus with diabetic polyneuropathy, without long-term current use of insulin ([place])   [facility], Lilia Argue, MD   3 months ago Medicare annual wellness visit, subsequent   [facility], Lilia Argue, MD   4 months ago Diarrhea of presumed infectious origin   [facility], Lilia Argue, MD   5 months ago Essential hypertension   [facility], Lilia Argue, MD   7 months ago Hematemesis with nausea   [facility], Lilia Argue, MD      Future Appointments            In 6 days Rutherford Guys, MD Primary Care at [place], [facility]

## 2020-02-08 ENCOUNTER — Other Ambulatory Visit: Payer: Self-pay

## 2020-02-08 ENCOUNTER — Encounter: Payer: Self-pay | Admitting: Family Medicine

## 2020-02-08 ENCOUNTER — Ambulatory Visit (INDEPENDENT_AMBULATORY_CARE_PROVIDER_SITE_OTHER): Payer: Medicare HMO | Admitting: Family Medicine

## 2020-02-08 VITALS — BP 138/76 | HR 76 | Temp 98.0°F | Ht 66.0 in | Wt 174.2 lb

## 2020-02-08 DIAGNOSIS — E1142 Type 2 diabetes mellitus with diabetic polyneuropathy: Secondary | ICD-10-CM

## 2020-02-08 DIAGNOSIS — F411 Generalized anxiety disorder: Secondary | ICD-10-CM

## 2020-02-08 DIAGNOSIS — Z79899 Other long term (current) drug therapy: Secondary | ICD-10-CM

## 2020-02-08 DIAGNOSIS — E039 Hypothyroidism, unspecified: Secondary | ICD-10-CM

## 2020-02-08 DIAGNOSIS — M2550 Pain in unspecified joint: Secondary | ICD-10-CM

## 2020-02-08 DIAGNOSIS — I10 Essential (primary) hypertension: Secondary | ICD-10-CM | POA: Diagnosis not present

## 2020-02-08 NOTE — Patient Instructions (Signed)
° ° ° °  If you have lab work done today you will be contacted with your lab results within the next 2 weeks.  If you have not heard from us then please contact us. The fastest way to get your results is to register for My Chart. ° ° °IF you received an x-ray today, you will receive an invoice from [facility]. Please contact [facility] at [phone] with questions or concerns regarding your invoice.  ° °IF you received labwork today, you will receive an invoice from [facility]. Please contact [facility] at [phone] with questions or concerns regarding your invoice.  ° °Our billing staff will not be able to assist you with questions regarding bills from these companies. ° °You will be contacted with the lab results as soon as they are available. The fastest way to get your results is to activate your My Chart account. Instructions are located on the last page of this paperwork. If you have not heard from us regarding the results in 2 weeks, please contact this office. °  ° ° ° °

## 2020-02-08 NOTE — Progress Notes (Signed)
3/15/20213:35 PM  Sherri Burns [birth year], 84 y.o., female [id number]  Chief Complaint  Patient presents with  . Diabetes    3 m f/u   . swelling legs and hands    going on a while (years)    HPI:   Patient is a 84 y.o. female with past medical history significant for DM2, CKD3, s/p nephrectomy, HTN, anxietyon long term bzd, hypothyroidism who presents today for followup  Last OV Dec 2020 - no changes Overall doing well  She takes all her meds as rx, denies any side effects She reports worsening more frequent swollen painful wrists, ankles, elbows, not red or swollen She does not check cbgs at home Has had first covid vaccine pmp not working Renal stopped diltizam  Lab Results  Component Value Date   TSH 3.300 08/07/2019   Lab Results  Component Value Date   HGBA1C 7.0 (H) 11/09/2019   HGBA1C 7.4 (H) 08/07/2019   HGBA1C 7.1 (H) 05/06/2019   Lab Results  Component Value Date   MICROALBUR 0.7 10/02/2016   LDLCALC 131 (H) 11/09/2019   CREATININE 1.35 (H) 12/09/2019    Depression screen PHQ 2/9 02/08/2020 10/19/2019 09/17/2019  Decreased Interest 0 0 0  Down, Depressed, Hopeless 0 0 0  PHQ - 2 Score 0 0 0  Altered sleeping - - -  Tired, decreased energy - - -  Change in appetite - - -  Feeling bad or failure about yourself  - - -  Trouble concentrating - - -  Moving slowly or fidgety/restless - - -  Suicidal thoughts - - -  PHQ-9 Score - - -  Difficult doing work/chores - - -  Some recent data might be hidden    Fall Risk  02/08/2020 11/09/2019 10/19/2019 09/17/2019 08/07/2019  Falls in the past year? 0 0 0 0 0  Number falls in past yr: 0 0 0 0 0  Injury with Fall? 0 0 0 0 0  Follow up Falls evaluation completed - Falls evaluation completed;Education provided - -     Allergies  Allergen Reactions  . Ace Inhibitors Swelling    See 02/16/19   . Codeine Other (See Comments)    hallucinations  . Metformin And Related     Upset stomach  .  Ciprocin-Fluocin-Procin [Fluocinolone Acetonide] Other (See Comments)    Unknown reaction  . Clonidine Derivatives Other (See Comments)    Dry mouth  . Crestor [Rosuvastatin Calcium] Other (See Comments)    cramps  . Penicillins Other (See Comments)    Has patient had a PCN reaction causing immediate rash, facial/tongue/throat swelling, SOB or lightheadedness with hypotension: no Has patient had a PCN reaction causing severe rash involving mucus membranes or skin necrosis: no Has patient had a PCN reaction that required hospitalization : no Has patient had a PCN reaction occurring within the last 10 years: no -Hallucinates If all of the above answers are "NO", then may proceed with Cephalosporin use.   . Simvastatin Other (See Comments)    Upset stomach   . Tessalon Perles Other (See Comments)    Gi upset     Prior to Admission medications   Medication Sig Start Date End Date Taking? Authorizing Provider  acetaminophen (TYLENOL) 500 MG tablet Take 500 mg by mouth every 6 (six) hours as needed for moderate pain.   Yes [provider]  acetic acid 2 % otic solution Place 4 drops into both ears 3 (three) times daily. 09/17/19  Yes  Rutherford Guys, MD  amLODipine (NORVASC) 10 MG tablet Take 1 tablet (10 mg total) by mouth daily. 01/30/19  Yes Rutherford Guys, MD  Bacillus Coagulans-Inulin (ALIGN PREBIOTIC-PROBIOTIC PO) Take 1 capsule by mouth daily.    Yes [provider]  brimonidine (ALPHAGAN) 0.2 % ophthalmic solution Place 1 drop into the left eye 2 (two) times daily.  10/27/19  Yes [provider]  cholecalciferol (VITAMIN D) 1000 units tablet Take 1,000 Units by mouth daily.   Yes [provider]  clonazePAM (KLONOPIN) 1 MG tablet TAKE HALF TABLET BY MOUTH EVERY MORNING AND TAKE ONE TABLET BY MOUTH AT BEDTIME 01/28/20  Yes Rutherford Guys, MD  Colloidal Oatmeal (ECZEMA MOISTURIZING) 1 % LOTN Apply 1 application topically as needed. Eczema cream   Yes  [provider]  [facility] OMEPRAZOLE PO Take 40 mg by mouth.   Yes [provider]  CYCLOGYL 2 % ophthalmic solution Place 1 drop into the left eye 3 (three) times daily.  10/27/19  Yes [provider]  diclofenac Sodium (VOLTAREN) 1 % GEL Apply 4 g topically 4 (four) times daily. 11/09/19  Yes Rutherford Guys, MD  diltiazem [facility]) 360 MG 24 hr capsule Take 360 mg by mouth daily.  10/27/19  Yes [provider]  fluticasone (FLONASE) 50 MCG/ACT nasal spray PLACE 1-2 SPRAYS INTO BOTH NOSTRILS AT BEDTIME. 02/02/20  Yes Rutherford Guys, MD  furosemide (LASIX) 40 MG tablet TAKE ONE TABLET BY MOUTH DAILY 01/28/20  Yes Rutherford Guys, MD  glipiZIDE (GLUCOTROL) 10 MG tablet TAKE TWO TABLETS BY MOUTH TWICE A DAY 12/29/19  Yes Rutherford Guys, MD  hydroxypropyl methylcellulose / hypromellose (ISOPTO TEARS / GONIOVISC) 2.5 % ophthalmic solution Place 2 drops into both eyes 3 (three) times daily as needed for dry eyes.   Yes [provider]  ketorolac (ACULAR) 0.5 % ophthalmic solution Place 1 drop into the left eye 4 (four) times daily.  10/27/19  Yes [provider]  levothyroxine (SYNTHROID) 88 MCG tablet TAKE ONE TABLET BY MOUTH DAILY BEFORE BREAKFAST Patient taking differently: Take 88 mcg by mouth daily before breakfast.  03/18/19  Yes Rutherford Guys, MD  metoprolol tartrate (LOPRESSOR) 100 MG tablet TAKE ONE TABLET BY MOUTH TWICE A DAY 01/28/20  Yes Rutherford Guys, MD  ofloxacin (OCUFLOX) 0.3 % ophthalmic solution Place 1 drop into the right eye 4 (four) times daily. 11/18/19  Yes [provider]  omeprazole (PRILOSEC) 40 MG capsule TAKE 1 CAPSULE (40 MG TOTAL) BY MOUTH TWO (TWO) TIMES DAILY. 12/15/19  Yes [place], MD  polyethylene glycol (MIRALAX / GLYCOLAX) 17 g packet Take 17 g by mouth daily as needed for mild constipation. 08/07/19  Yes Rutherford Guys, MD  prednisoLONE acetate (PRED FORTE) 1 % ophthalmic suspension Place 1 drop into  the left eye 4 (four) times daily. 11/18/19  Yes [provider]  vitamin E 100 UNIT capsule Take 100 Units by mouth daily.   Yes [provider]  hydrocortisone (ANUSOL-HC) 25 MG suppository Place 1 suppository (25 mg total) rectally 2 (two) times daily. Patient not taking: Reported on 02/08/2020 09/17/19   Rutherford Guys, MD    Past Medical History:  Diagnosis Date  . Abnormal EKG 02/07/2016   Inferolateral T wave inversion and ST depression.  . Acute calculous cholecystitis   . Anxiety   . Arthritis   . Chronic diastolic CHF (congestive heart failure) ([place]) 02/21/2018  . Colon polyps 11/2008  tubular adenoma  . Diabetes mellitus   . Esophageal problem    esophageal dilation  . GERD (gastroesophageal reflux disease)    + hpylori  . GI bleeding 01/2018  . Headache   . Hyperlipidemia   . Hypertension   . Hypothyroidism   . Ischemic colitis ([place])   . Renal cancer, right ([place]) 2010   s/p Rt nephrectomy by Dr. Rosana Hoes  . Renal insufficiency     Past Surgical History:  Procedure Laterality Date  . ABDOMINAL HYSTERECTOMY  1978   partial  . CATARACT EXTRACTION    . CHOLECYSTECTOMY N/A 04/04/2016   Procedure: LAPAROSCOPIC CHOLECYSTECTOMY;  Surgeon: Autumn Messing III, MD;  Location: [facility];  Service: General;  Laterality: N/A;  . COLONOSCOPY    . ESOPHAGEAL DILATION  2016  . HERNIA REPAIR    . LAPAROSCOPIC CHOLECYSTECTOMY  04/04/2016  . NEPHRECTOMY Right 2010   Dr. Rosana Hoes, urology, due to renal cancer    Social History   Tobacco Use  . Smoking status: Current Some Day Smoker    Packs/day: 0.25    Years: 55.00    Pack years: 13.75    Types: Cigarettes  . Smokeless tobacco: Never Used  . Tobacco comment: cut back amount still  trying to quit  Substance Use Topics  . Alcohol use: No    Alcohol/week: 0.0 standard drinks    Family History  Problem Relation Age of Onset  . Cancer Mother        colon, kidney cancer  . Cancer Father        throat cancer  .  Cancer Sister   . Heart attack Brother     Review of Systems  Constitutional: Negative for chills and fever.  Respiratory: Negative for cough and shortness of breath.   Cardiovascular: Negative for chest pain, palpitations and leg swelling.  Gastrointestinal: Negative for abdominal pain, nausea and vomiting.     OBJECTIVE:  Today's Vitals   02/08/20 1511 02/08/20 1532  BP: (!) 160/78 138/76  Pulse: 76   Temp: 98 F (36.7 C)   TempSrc: Temporal   SpO2: 96%   Weight: 174 lb 3.2 oz (79 kg)   Height: 5\' 6"  (1.676 m)    Body mass index is 28.12 kg/m.   Wt Readings from Last 3 Encounters:  02/08/20 174 lb 3.2 oz (79 kg)  11/09/19 171 lb 12.8 oz (77.9 kg)  10/19/19 172 lb (78 kg)     Physical Exam Vitals and nursing note reviewed.  Constitutional:      Appearance: She is well-developed.  HENT:     Head: Normocephalic and atraumatic.     Mouth/Throat:     Pharynx: No oropharyngeal exudate.  Eyes:     General: No scleral icterus.    Conjunctiva/sclera: Conjunctivae normal.     Pupils: Pupils are equal, round, and reactive to light.  Cardiovascular:     Rate and Rhythm: Normal rate and regular rhythm.     Heart sounds: Normal heart sounds. No murmur. No friction rub. No gallop.   Pulmonary:     Effort: Pulmonary effort is normal.     Breath sounds: Normal breath sounds. No wheezing or rales.  Musculoskeletal:     Cervical back: Neck supple.  Skin:    General: Skin is warm and dry.  Neurological:     Mental Status: She is alert and oriented to person, place, and time.     No results found for this or any previous visit (from  the past 24 hour(s)).  No results found.   ASSESSMENT and PLAN  1. Essential hypertension Controlled. Continue current regime.  - Comprehensive metabolic panel  2. Hypothyroidism, unspecified type Checking labs today, medications will be adjusted as needed.  - TSH  3. Type 2 diabetes mellitus with diabetic polyneuropathy, without  long-term current use of insulin (HCC) Controlled. Continue current regime.   4. GAD (generalized anxiety disorder) 5. Long term prescription benzodiazepine use Controlled. Continue current regime.  - ToxASSURE Select 13 (MW), Urine  6. Multiple joint pain - Sedimentation Rate - C-reactive protein - ANA w/Reflex if Positive - Rheumatoid factor  Other orders   Return in about 3 months (around 05/10/2020).    Rutherford Guys, MD Primary Care at [facility], [place] [address] Ph.  [phone] Fax [phone]

## 2020-02-09 LAB — COMPREHENSIVE METABOLIC PANEL
ALT: 15 IU/L (ref 0–32)
AST: 18 IU/L (ref 0–40)
Albumin/Globulin Ratio: 1.8 (ref 1.2–2.2)
Albumin: 4.5 g/dL (ref 3.6–4.6)
Alkaline Phosphatase: 76 IU/L (ref 39–117)
BUN/Creatinine Ratio: 13 (ref 12–28)
BUN: 16 mg/dL (ref 8–27)
Bilirubin Total: 0.4 mg/dL (ref 0.0–1.2)
CO2: 27 mmol/L (ref 20–29)
Calcium: 9.6 mg/dL (ref 8.7–10.3)
Chloride: 102 mmol/L (ref 96–106)
Creatinine, Ser: 1.22 mg/dL — ABNORMAL HIGH (ref 0.57–1.00)
GFR calc Af Amer: 48 mL/min/{1.73_m2} — ABNORMAL LOW (ref >59)
GFR calc non Af Amer: 42 mL/min/{1.73_m2} — ABNORMAL LOW (ref >59)
Globulin, Total: 2.5 g/dL (ref 1.5–4.5)
Glucose: 101 mg/dL — ABNORMAL HIGH (ref 65–99)
Potassium: 4.1 mmol/L (ref 3.5–5.2)
Sodium: 142 mmol/L (ref 134–144)
Total Protein: 7 g/dL (ref 6.0–8.5)

## 2020-02-09 LAB — C-REACTIVE PROTEIN: CRP: 3 mg/L (ref 0–10)

## 2020-02-09 LAB — RHEUMATOID FACTOR: Rheumatoid fact SerPl-aCnc: <10 IU/mL (ref 0.0–13.9)

## 2020-02-09 LAB — TSH: TSH: 0.682 u[IU]/mL (ref 0.450–4.500)

## 2020-02-09 LAB — ANA W/REFLEX IF POSITIVE: Anti Nuclear Antibody (ANA): NEGATIVE

## 2020-02-09 LAB — SEDIMENTATION RATE: Sed Rate: 20 mm/hr (ref 0–40)

## 2020-02-10 LAB — TOXASSURE SELECT 13 (MW), URINE

## 2020-02-16 DIAGNOSIS — D631 Anemia in chronic kidney disease: Secondary | ICD-10-CM | POA: Diagnosis not present

## 2020-02-16 DIAGNOSIS — I129 Hypertensive chronic kidney disease with stage 1 through stage 4 chronic kidney disease, or unspecified chronic kidney disease: Secondary | ICD-10-CM | POA: Diagnosis not present

## 2020-02-16 DIAGNOSIS — N2581 Secondary hyperparathyroidism of renal origin: Secondary | ICD-10-CM | POA: Diagnosis not present

## 2020-02-16 DIAGNOSIS — E1122 Type 2 diabetes mellitus with diabetic chronic kidney disease: Secondary | ICD-10-CM | POA: Diagnosis not present

## 2020-02-16 DIAGNOSIS — N183 Chronic kidney disease, stage 3 unspecified: Secondary | ICD-10-CM | POA: Diagnosis not present

## 2020-02-25 ENCOUNTER — Other Ambulatory Visit: Payer: Self-pay | Admitting: Family Medicine

## 2020-02-25 DIAGNOSIS — I5032 Chronic diastolic (congestive) heart failure: Secondary | ICD-10-CM

## 2020-03-04 ENCOUNTER — Other Ambulatory Visit: Payer: Self-pay | Admitting: Family Medicine

## 2020-03-04 DIAGNOSIS — E038 Other specified hypothyroidism: Secondary | ICD-10-CM

## 2020-03-04 NOTE — Telephone Encounter (Signed)
Requested Prescriptions  Pending Prescriptions Disp Refills  . levothyroxine (SYNTHROID) 88 MCG tablet [Pharmacy Med Name: LEVOTHYROXINE SODIUM 88MCG TAB] 90 tablet 3    Sig: TAKE ONE TABLET BY MOUTH DAILY BEFORE BREAKFAST     Endocrinology:  Hypothyroid Agents Failed - 03/04/2020  3:03 PM      Failed - TSH needs to be rechecked within 3 months after an abnormal result. Refill until TSH is due.      Passed - TSH in normal range and within 360 days    TSH  Date Value Ref Range Status  02/08/2020 0.682 0.450 - 4.500 uIU/mL Final         Passed - Valid encounter within last 12 months    Recent Outpatient Visits          3 weeks ago Essential hypertension   Primary Care at [facility], Lilia Argue, MD   3 months ago Type 2 diabetes mellitus with diabetic polyneuropathy, without long-term current use of insulin [facility])   [facility] at [facility], Lilia Argue, MD   4 months ago Medicare annual wellness visit, subsequent   [facility] at [facility], Lilia Argue, MD   5 months ago Diarrhea of presumed infectious origin   [facility] at [facility], Lilia Argue, MD   7 months ago Essential hypertension   [facility] at [facility], Lilia Argue, MD      Future Appointments            In 2 months Rutherford Guys, MD Primary [facility] at [place], [place]           . amLODipine ([place]) 10 MG tablet [Pharmacy Med Name: amLODIPine BESYLATE 10MG  TAB] 90 tablet 1    Sig: TAKE 1 TABLET (10 MG TOTAL) BY MOUTH DAILY.     Cardiovascular:  Calcium Channel Blockers Passed - 03/04/2020  3:03 PM      Passed - Last BP in normal range    BP Readings from Last 1 Encounters:  02/08/20 138/76         Passed - Valid encounter within last 6 months    Recent Outpatient Visits          3 weeks ago Essential hypertension   [facility] at [facility], Lilia Argue, MD   3 months ago Type 2 diabetes mellitus with diabetic polyneuropathy, without long-term current use of insulin [facility])   [facility], Lilia Argue, MD   4 months ago Medicare annual wellness visit, subsequent   [facility] at [facility], Lilia Argue, MD   5 months ago Diarrhea of presumed infectious origin   [facility], Lilia Argue, MD   7 months ago Essential hypertension   [facility] at [facility], Lilia Argue, MD      Future Appointments            In 2 months Rutherford Guys, MD Primary Care at [place], [facility]

## 2020-03-15 ENCOUNTER — Ambulatory Visit: Payer: Medicare HMO | Admitting: Adult Health Nurse Practitioner

## 2020-03-17 DIAGNOSIS — H43812 Vitreous degeneration, left eye: Secondary | ICD-10-CM | POA: Diagnosis not present

## 2020-03-17 DIAGNOSIS — H524 Presbyopia: Secondary | ICD-10-CM | POA: Diagnosis not present

## 2020-03-17 DIAGNOSIS — H04123 Dry eye syndrome of bilateral lacrimal glands: Secondary | ICD-10-CM | POA: Diagnosis not present

## 2020-03-24 ENCOUNTER — Encounter: Payer: Self-pay | Admitting: Family Medicine

## 2020-03-24 ENCOUNTER — Other Ambulatory Visit: Payer: Self-pay

## 2020-03-24 ENCOUNTER — Ambulatory Visit (INDEPENDENT_AMBULATORY_CARE_PROVIDER_SITE_OTHER): Payer: Medicare Other | Admitting: Family Medicine

## 2020-03-24 VITALS — BP 148/80 | HR 78 | Temp 97.6°F | Ht 66.0 in | Wt 177.0 lb

## 2020-03-24 DIAGNOSIS — K219 Gastro-esophageal reflux disease without esophagitis: Secondary | ICD-10-CM

## 2020-03-24 DIAGNOSIS — I10 Essential (primary) hypertension: Secondary | ICD-10-CM

## 2020-03-24 DIAGNOSIS — I5032 Chronic diastolic (congestive) heart failure: Secondary | ICD-10-CM | POA: Diagnosis not present

## 2020-03-24 DIAGNOSIS — L89610 Pressure ulcer of right heel, unstageable: Secondary | ICD-10-CM

## 2020-03-24 DIAGNOSIS — E1142 Type 2 diabetes mellitus with diabetic polyneuropathy: Secondary | ICD-10-CM

## 2020-03-24 MED ORDER — FUROSEMIDE 40 MG PO TABS: ORAL_TABLET | ORAL | 0 refills | Status: DC

## 2020-03-24 NOTE — Patient Instructions (Addendum)
  Trial of probiotics    If you have lab work done today you will be contacted with your lab results within the next 2 weeks.  If you have not heard from [place] then please contact us. The fastest way to get your results is to register for My Chart.   IF you received an x-ray today, you will receive an invoice from [facility]. Please contact [facility] at [phone] with questions or concerns regarding your invoice.   IF you received labwork today, you will receive an invoice from [facility]. Please contact [facility] at [phone] with questions or concerns regarding your invoice.   Our billing staff will not be able to assist you with questions regarding bills from these companies.  You will be contacted with the lab results as soon as they are available. The fastest way to get your results is to activate your My Chart account. Instructions are located on the last page of this paperwork. If you have not heard from [place] regarding the results in 2 weeks, please contact this office.

## 2020-03-24 NOTE — Progress Notes (Signed)
4/29/20214:46 PM  Sherri Burns January 10, 1936, 84 y.o., female [id number]  Chief Complaint  Patient presents with  . sores on heels    x1 month , foot and ankle swelling , r knee bruise  . Abdominal Pain    daily     HPI:   Patient is a 84 y.o. female with past medical history significant for DM2, CKD3, s/p nephrectomy, HTN, anxietyon long term bzd, hypothyroidismwho presents today with several concerns  Last OV a month ago Saw renal a week ago - no changes, was not having edema then  Patient still has redness and bruise on right heel after hitting her foot against the bed, however area of injury was lateral ankle She is walking ok, mostly tender to touch She does have neuropathy from her DM2 She is also having difficulties caring for nails as bending over causes pain in her back and nails are thick Also has been having increased swelling of ankles and legs over last several days She does not see the podiatrist  Epigastric pain with bloating, on pantoprazole BID, last saw GI in sept 2020  Jan 2021 - normal CT abd and pelvis Denies any vomiting or melena  Lab Results  Component Value Date   HGBA1C 7.0 (H) 11/09/2019   HGBA1C 7.4 (H) 08/07/2019   HGBA1C 7.1 (H) 05/06/2019   Lab Results  Component Value Date   MICROALBUR 0.7 10/02/2016   LDLCALC 131 (H) 11/09/2019   CREATININE 1.22 (H) 02/08/2020    Depression screen PHQ 2/9 03/24/2020 02/08/2020 10/19/2019  Decreased Interest 0 0 0  Down, Depressed, Hopeless 0 0 0  PHQ - 2 Score 0 0 0  Altered sleeping - - -  Tired, decreased energy - - -  Change in appetite - - -  Feeling bad or failure about yourself  - - -  Trouble concentrating - - -  Moving slowly or fidgety/restless - - -  Suicidal thoughts - - -  PHQ-9 Score - - -  Difficult doing work/chores - - -  Some recent data might be hidden    Fall Risk  03/24/2020 02/08/2020 11/09/2019 10/19/2019 09/17/2019  Falls in the past year? 0 0 0 0 0  Number falls  in past yr: 0 0 0 0 0  Injury with Fall? 0 0 0 0 0  Follow up Falls evaluation completed Falls evaluation completed - Falls evaluation completed;Education provided -     Allergies  Allergen Reactions  . Ace Inhibitors Swelling    See 02/16/19   . Codeine Other (See Comments)    hallucinations  . Metformin And Related     Upset stomach  . Ciprocin-Fluocin-Procin [Fluocinolone Acetonide] Other (See Comments)    Unknown reaction  . Clonidine Derivatives Other (See Comments)    Dry mouth  . Crestor [Rosuvastatin Calcium] Other (See Comments)    cramps  . Penicillins Other (See Comments)    Has patient had a PCN reaction causing immediate rash, facial/tongue/throat swelling, SOB or lightheadedness with hypotension: no Has patient had a PCN reaction causing severe rash involving mucus membranes or skin necrosis: no Has patient had a PCN reaction that required hospitalization : no Has patient had a PCN reaction occurring within the last 10 years: no -Hallucinates If all of the above answers are "NO", then may proceed with Cephalosporin use.   . Simvastatin Other (See Comments)    Upset stomach   . Tessalon Perles Other (See Comments)    Gi upset  Prior to Admission medications   Medication Sig Start Date End Date Taking? Authorizing Provider  acetaminophen (TYLENOL) 500 MG tablet Take 500 mg by mouth every 6 (six) hours as needed for moderate pain.   Yes [provider]  acetic acid 2 % otic solution Place 4 drops into both ears 3 (three) times daily. 09/17/19  Yes Rutherford Guys, MD  amLODipine (NORVASC) 10 MG tablet TAKE 1 TABLET (10 MG TOTAL) BY MOUTH DAILY. 03/04/20  Yes Rutherford Guys, MD  Bacillus Coagulans-Inulin (ALIGN PREBIOTIC-PROBIOTIC PO) Take 1 capsule by mouth daily.    Yes [provider]  brimonidine (ALPHAGAN) 0.2 % ophthalmic solution Place 1 drop into the left eye 2 (two) times daily.  10/27/19  Yes [provider]  cholecalciferol  (VITAMIN D) 1000 units tablet Take 1,000 Units by mouth daily.   Yes [provider]  clonazePAM (KLONOPIN) 1 MG tablet TAKE HALF TABLET BY MOUTH EVERY MORNING AND TAKE ONE TABLET BY MOUTH AT BEDTIME 01/28/20  Yes Rutherford Guys, MD  Colloidal Oatmeal (ECZEMA MOISTURIZING) 1 % LOTN Apply 1 application topically as needed. Eczema cream   Yes [provider]  diclofenac Sodium (VOLTAREN) 1 % GEL Apply 4 g topically 4 (four) times daily. 11/09/19  Yes Rutherford Guys, MD  fluticasone (FLONASE) 50 MCG/ACT nasal spray PLACE 1-2 SPRAYS INTO BOTH NOSTRILS AT BEDTIME. 02/02/20  Yes Rutherford Guys, MD  furosemide (LASIX) 40 MG tablet TAKE ONE TABLET BY MOUTH DAILY 01/28/20  Yes Rutherford Guys, MD  glipiZIDE (GLUCOTROL) 10 MG tablet TAKE TWO TABLETS BY MOUTH TWICE A DAY 12/29/19  Yes Rutherford Guys, MD  hydrocortisone (ANUSOL-HC) 25 MG suppository Place 1 suppository (25 mg total) rectally 2 (two) times daily. 09/17/19  Yes Rutherford Guys, MD  levothyroxine (SYNTHROID) 88 MCG tablet TAKE ONE TABLET BY MOUTH DAILY BEFORE BREAKFAST 03/04/20  Yes Rutherford Guys, MD  metoprolol tartrate (LOPRESSOR) 100 MG tablet TAKE ONE TABLET BY MOUTH TWICE A DAY 01/28/20  Yes Rutherford Guys, MD  omeprazole (PRILOSEC) 40 MG capsule TAKE 1 CAPSULE (40 MG TOTAL) BY MOUTH TWO (TWO) TIMES DAILY. 12/15/19  Yes Thornton Park, MD  polyethylene glycol (MIRALAX / GLYCOLAX) 17 g packet Take 17 g by mouth daily as needed for mild constipation. 08/07/19  Yes Rutherford Guys, MD  vitamin E 100 UNIT capsule Take 100 Units by mouth daily.   Yes [provider]  CYCLOGYL 2 % ophthalmic solution Place 1 drop into the left eye 3 (three) times daily.  10/27/19   [provider]  hydroxypropyl methylcellulose / hypromellose (ISOPTO TEARS / GONIOVISC) 2.5 % ophthalmic solution Place 2 drops into both eyes 3 (three) times daily as needed for dry eyes.    [provider]  ketorolac (ACULAR) 0.5 %  ophthalmic solution Place 1 drop into the left eye 4 (four) times daily.  10/27/19   [provider]  ofloxacin (OCUFLOX) 0.3 % ophthalmic solution Place 1 drop into the right eye 4 (four) times daily. 11/18/19   [provider]  prednisoLONE acetate (PRED FORTE) 1 % ophthalmic suspension Place 1 drop into the left eye 4 (four) times daily. 11/18/19   [provider]    Past Medical History:  Diagnosis Date  . Abnormal EKG 02/07/2016   Inferolateral T wave inversion and ST depression.  . Acute calculous cholecystitis   . Anxiety   . Arthritis   . Chronic diastolic CHF (congestive heart failure) (  [place]) 02/21/2018  . Colon polyps 11/2008   tubular adenoma  . Diabetes mellitus   . Esophageal problem    esophageal dilation  . GERD (gastroesophageal reflux disease)    + hpylori  . GI bleeding 01/2018  . Headache   . Hyperlipidemia   . Hypertension   . Hypothyroidism   . Ischemic colitis ([place])   . Renal cancer, right ([place]) 2010   s/p Rt nephrectomy by Dr. Rosana Hoes  . Renal insufficiency     Past Surgical History:  Procedure Laterality Date  . ABDOMINAL HYSTERECTOMY  1978   partial  . CATARACT EXTRACTION    . CHOLECYSTECTOMY N/A 04/04/2016   Procedure: LAPAROSCOPIC CHOLECYSTECTOMY;  Surgeon: Autumn Messing III, MD;  Location: [facility];  Service: General;  Laterality: N/A;  . COLONOSCOPY    . ESOPHAGEAL DILATION  2016  . HERNIA REPAIR    . LAPAROSCOPIC CHOLECYSTECTOMY  04/04/2016  . NEPHRECTOMY Right 2010   Dr. Rosana Hoes, urology, due to renal cancer    Social History   Tobacco Use  . Smoking status: Current Some Day Smoker    Packs/day: 0.25    Years: 55.00    Pack years: 13.75    Types: Cigarettes  . Smokeless tobacco: Never Used  . Tobacco comment: cut back amount still  trying to quit  Substance Use Topics  . Alcohol use: No    Alcohol/week: 0.0 standard drinks    Family History  Problem Relation Age of Onset  . Cancer Mother        colon, kidney  cancer  . Cancer Father        throat cancer  . Cancer Sister   . Heart attack Brother     Review of Systems  Constitutional: Negative for chills and fever.  Respiratory: Negative for cough and shortness of breath.   Cardiovascular: Positive for leg swelling. Negative for chest pain and palpitations.  Gastrointestinal: Positive for abdominal pain and heartburn. Negative for constipation, diarrhea, nausea and vomiting.   Per hpi  OBJECTIVE:  Today's Vitals   03/24/20 1610 03/24/20 1718  BP: (!) 143/84 (!) 148/80  Pulse: 78   Temp: 97.6 F (36.4 C)   SpO2: 95%   Weight: 177 lb (80.3 kg)   Height: 5\' 6"  (1.676 m)    Body mass index is 28.57 kg/m.  BP Readings from Last 3 Encounters:  03/24/20 (!) 143/84  02/08/20 138/76  12/09/19 (!) 152/83    Physical Exam Vitals and nursing note reviewed.  Constitutional:      Appearance: She is well-developed.  HENT:     Head: Normocephalic and atraumatic.     Mouth/Throat:     Pharynx: No oropharyngeal exudate.  Eyes:     General: No scleral icterus.    Conjunctiva/sclera: Conjunctivae normal.     Pupils: Pupils are equal, round, and reactive to light.  Cardiovascular:     Rate and Rhythm: Normal rate and regular rhythm.     Heart sounds: Normal heart sounds. No murmur. No friction rub. No gallop.   Pulmonary:     Effort: Pulmonary effort is normal.     Breath sounds: Normal breath sounds. No wheezing, rhonchi or rales.  Musculoskeletal:     Cervical back: Neck supple.  Feet:     Right foot:     Skin integrity: Ulcer (heel with tender aread of erythema with central blackening) present.     Toenail Condition: Right toenails are abnormally thick.     Left foot:  Skin integrity: Skin integrity normal.     Toenail Condition: Left toenails are abnormally thick.  Skin:    General: Skin is warm and dry.  Neurological:     Mental Status: She is alert and oriented to person, place, and time.     No results found for  this or any previous visit (from the past 24 hour(s)).  No results found.   Diabetic Foot Form - Detailed   Diabetic Foot Exam - detailed Diabetic Foot exam was performed with the following findings: Yes 03/24/2020  4:49 PM  Pulse Foot Exam completed.: Yes  Sensory Foot Exam Completed.: Yes Semmes-Weinstein Monofilament Test   Comments: Darkened area on R heel      ASSESSMENT and PLAN  1. Type 2 diabetes mellitus with diabetic polyneuropathy, without long-term current use of insulin ([place]) 2. Pressure injury of right heel, unstageable ([place]) Controlled DM with concern for new pressure ulcer of right heel. Referring to podiatry for further eval and treatment. - HM Diabetes Foot Exam - Microalbumin / creatinine urine ratio - Ambulatory referral to Podiatry  3. Essential hypertension Above goal. Increase lasix to 40mg  AM and 20mg  PM  4. Chronic diastolic CHF (congestive heart failure) (HCC) - furosemide (LASIX) 40 MG tablet; Take 1 tablet (40 mg total) by mouth in the morning AND 0.5 tablets (20 mg total) daily in the afternoon. - Basic Metabolic Panel; Future - in 1 week  5. Gastroesophageal reflux disease without esophagitis Not well controlled despite PPI BID. Trial of probiotics. Discussed reaching out to GI if no improvement.    Return in about 2 weeks (around 04/07/2020) for edema and HTN.    Rutherford Guys, MD Primary Care at [place], [place] [address] Ph.  [phone] Fax [phone]

## 2020-03-25 LAB — MICROALBUMIN / CREATININE URINE RATIO
Creatinine, Urine: 106.1 mg/dL
Microalb/Creat Ratio: 9 mg/g creat (ref 0–29)
Microalbumin, Urine: 9.9 ug/mL

## 2020-03-31 ENCOUNTER — Ambulatory Visit (INDEPENDENT_AMBULATORY_CARE_PROVIDER_SITE_OTHER): Payer: Medicare Other | Admitting: Family Medicine

## 2020-03-31 ENCOUNTER — Other Ambulatory Visit: Payer: Self-pay

## 2020-03-31 DIAGNOSIS — I5032 Chronic diastolic (congestive) heart failure: Secondary | ICD-10-CM

## 2020-03-31 NOTE — Progress Notes (Signed)
Lab only visit 

## 2020-04-01 ENCOUNTER — Encounter: Payer: Self-pay | Admitting: Radiology

## 2020-04-01 LAB — BASIC METABOLIC PANEL
BUN/Creatinine Ratio: 12 (ref 12–28)
BUN: 16 mg/dL (ref 8–27)
CO2: 27 mmol/L (ref 20–29)
Calcium: 10 mg/dL (ref 8.7–10.3)
Chloride: 103 mmol/L (ref 96–106)
Creatinine, Ser: 1.37 mg/dL — ABNORMAL HIGH (ref 0.57–1.00)
GFR calc Af Amer: 42 mL/min/{1.73_m2} — ABNORMAL LOW (ref >59)
GFR calc non Af Amer: 36 mL/min/{1.73_m2} — ABNORMAL LOW (ref >59)
Glucose: 154 mg/dL — ABNORMAL HIGH (ref 65–99)
Potassium: 4.6 mmol/L (ref 3.5–5.2)
Sodium: 144 mmol/L (ref 134–144)

## 2020-04-07 ENCOUNTER — Telehealth: Payer: Self-pay | Admitting: Family Medicine

## 2020-04-07 NOTE — Telephone Encounter (Signed)
Do not see where this pt was called bc nothing is documented.

## 2020-04-07 NOTE — Telephone Encounter (Signed)
Pt missed call / couldn't get to phone. She thinks maybe we are calling with recent lab results

## 2020-04-11 ENCOUNTER — Other Ambulatory Visit: Payer: Self-pay

## 2020-04-11 ENCOUNTER — Ambulatory Visit (INDEPENDENT_AMBULATORY_CARE_PROVIDER_SITE_OTHER): Payer: Medicare Other | Admitting: Family Medicine

## 2020-04-11 ENCOUNTER — Encounter: Payer: Self-pay | Admitting: Family Medicine

## 2020-04-11 VITALS — BP 138/70 | HR 83 | Temp 98.0°F | Ht 72.0 in | Wt 177.0 lb

## 2020-04-11 DIAGNOSIS — I5032 Chronic diastolic (congestive) heart failure: Secondary | ICD-10-CM | POA: Diagnosis not present

## 2020-04-11 DIAGNOSIS — N1831 Chronic kidney disease, stage 3a: Secondary | ICD-10-CM

## 2020-04-11 DIAGNOSIS — I10 Essential (primary) hypertension: Secondary | ICD-10-CM | POA: Diagnosis not present

## 2020-04-11 MED ORDER — FUROSEMIDE 40 MG PO TABS: 40.0000 mg | ORAL_TABLET | Freq: Two times a day (BID) | ORAL | 0 refills | Status: DC

## 2020-04-11 NOTE — Patient Instructions (Addendum)
  Lab draw in 2 weeks   If you have lab work done today you will be contacted with your lab results within the next 2 weeks.  If you have not heard from [place] then please contact us. The fastest way to get your results is to register for My Chart.   IF you received an x-ray today, you will receive an invoice from [facility]. Please contact [facility] at [phone] with questions or concerns regarding your invoice.   IF you received labwork today, you will receive an invoice from [facility]. Please contact [facility] at [phone] with questions or concerns regarding your invoice.   Our billing staff will not be able to assist you with questions regarding bills from these companies.  You will be contacted with the lab results as soon as they are available. The fastest way to get your results is to activate your My Chart account. Instructions are located on the last page of this paperwork. If you have not heard from [place] regarding the results in 2 weeks, please contact this office.

## 2020-04-11 NOTE — Addendum Note (Signed)
Addended by: Amalia Hailey on: 04/11/2020 04:30 PM   Modules accepted: Orders

## 2020-04-11 NOTE — Progress Notes (Signed)
5/17/20213:57 PM  Sherri Burns May 06, 1936, 84 y.o., female [id number]  Chief Complaint  Patient presents with  . Follow-up    htn and edema, still having sweeling in the right leg and bloating in th stomach on the right side. Has appt tomorrow with podiatry    HPI:   Patient is a 84 y.o. female with past medical history significant for DM2, CKD3, s/p nephrectomy, HTN, anxietyon long term bzd, hypothyroidism, HLPwith statin intolerance who presents today for followup on HTN and edema  Last OV about 2 weeks ago Increased lasix by 20mg  daily, repots swelling a bit better Tolerating well, no dizziness, muscle cramps, palpitations, chest pain, SOB Has no acute concerns today  Lab Results  Component Value Date   CREATININE 1.37 (H) 03/31/2020   BUN 16 03/31/2020   NA 144 03/31/2020   K 4.6 03/31/2020   CL 103 03/31/2020   CO2 27 03/31/2020    Lab Results  Component Value Date   CREATININE 1.37 (H) 03/31/2020   CREATININE 1.22 (H) 02/08/2020   CREATININE 1.35 (H) 12/09/2019   BP Readings from Last 3 Encounters:  04/11/20 138/70  03/24/20 (!) 148/80  02/08/20 138/76   Wt Readings from Last 3 Encounters:  04/11/20 177 lb (80.3 kg)  03/24/20 177 lb (80.3 kg)  02/08/20 174 lb 3.2 oz (79 kg)    Depression screen [facility] 2/9 04/11/2020 03/24/2020 02/08/2020  Decreased Interest 0 0 0  Down, Depressed, Hopeless 0 0 0  PHQ - 2 Score 0 0 0  Altered sleeping - - -  Tired, decreased energy - - -  Change in appetite - - -  Feeling bad or failure about yourself  - - -  Trouble concentrating - - -  Moving slowly or fidgety/restless - - -  Suicidal thoughts - - -  PHQ-9 Score - - -  Difficult doing work/chores - - -  Some recent data might be hidden    Fall Risk  04/11/2020 03/24/2020 02/08/2020 11/09/2019 10/19/2019  Falls in the past year? 0 0 0 0 0  Number falls in past yr: 0 0 0 0 0  Injury with Fall? 0 0 0 0 0  Follow up - Falls evaluation completed Falls evaluation  completed - Falls evaluation completed;Education provided     Allergies  Allergen Reactions  . Ace Inhibitors Swelling    See 02/16/19   . Codeine Other (See Comments)    hallucinations  . Metformin And Related     Upset stomach  . Ciprocin-Fluocin-Procin [Fluocinolone Acetonide] Other (See Comments)    Unknown reaction  . Clonidine Derivatives Other (See Comments)    Dry mouth  . Crestor [Rosuvastatin Calcium] Other (See Comments)    cramps  . Penicillins Other (See Comments)    Has patient had a PCN reaction causing immediate rash, facial/tongue/throat swelling, SOB or lightheadedness with hypotension: no Has patient had a PCN reaction causing severe rash involving mucus membranes or skin necrosis: no Has patient had a PCN reaction that required hospitalization : no Has patient had a PCN reaction occurring within the last 10 years: no -Hallucinates If all of the above answers are "NO", then may proceed with Cephalosporin use.   . Simvastatin Other (See Comments)    Upset stomach   . Tessalon Perles Other (See Comments)    Gi upset     Prior to Admission medications   Medication Sig Start Date End Date Taking? Authorizing Provider  acetaminophen (TYLENOL) 500 MG tablet  Take 500 mg by mouth every 6 (six) hours as needed for moderate pain.   Yes [provider]  acetic acid 2 % otic solution Place 4 drops into both ears 3 (three) times daily. 09/17/19  Yes Rutherford Guys, MD  amLODipine (NORVASC) 10 MG tablet TAKE 1 TABLET (10 MG TOTAL) BY MOUTH DAILY. 03/04/20  Yes Rutherford Guys, MD  Bacillus Coagulans-Inulin (ALIGN PREBIOTIC-PROBIOTIC PO) Take 1 capsule by mouth daily.    Yes [provider]  brimonidine (ALPHAGAN) 0.2 % ophthalmic solution Place 1 drop into the left eye 2 (two) times daily.  10/27/19  Yes [provider]  cholecalciferol (VITAMIN D) 1000 units tablet Take 1,000 Units by mouth daily.   Yes [provider]  clonazePAM  (KLONOPIN) 1 MG tablet TAKE HALF TABLET BY MOUTH EVERY MORNING AND TAKE ONE TABLET BY MOUTH AT BEDTIME 01/28/20  Yes Rutherford Guys, MD  Colloidal Oatmeal (ECZEMA MOISTURIZING) 1 % LOTN Apply 1 application topically as needed. Eczema cream   Yes [provider]  CYCLOGYL 2 % ophthalmic solution Place 1 drop into the left eye 3 (three) times daily.  10/27/19  Yes [provider]  diclofenac Sodium (VOLTAREN) 1 % GEL Apply 4 g topically 4 (four) times daily. 11/09/19  Yes Rutherford Guys, MD  fluticasone [facility]) 50 MCG/ACT nasal spray PLACE 1-2 SPRAYS INTO BOTH NOSTRILS AT BEDTIME. 02/02/20  Yes Rutherford Guys, MD  furosemide (LASIX) 40 MG tablet Take 1 tablet (40 mg total) by mouth in the morning AND 0.5 tablets (20 mg total) daily in the afternoon. 03/24/20  Yes Rutherford Guys, MD  glipiZIDE (GLUCOTROL) 10 MG tablet TAKE TWO TABLETS BY MOUTH TWICE A DAY 12/29/19  Yes Rutherford Guys, MD  hydrocortisone (ANUSOL-HC) 25 MG suppository Place 1 suppository (25 mg total) rectally 2 (two) times daily. 09/17/19  Yes Rutherford Guys, MD  hydroxypropyl methylcellulose / hypromellose (ISOPTO TEARS / GONIOVISC) 2.5 % ophthalmic solution Place 2 drops into both eyes 3 (three) times daily as needed for dry eyes.   Yes [provider]  ketorolac (ACULAR) 0.5 % ophthalmic solution Place 1 drop into the left eye 4 (four) times daily.  10/27/19  Yes [provider]  levothyroxine (SYNTHROID) 88 MCG tablet TAKE ONE TABLET BY MOUTH DAILY BEFORE BREAKFAST 03/04/20  Yes Rutherford Guys, MD  metoprolol tartrate (LOPRESSOR) 100 MG tablet TAKE ONE TABLET BY MOUTH TWICE A DAY 01/28/20  Yes Rutherford Guys, MD  ofloxacin (OCUFLOX) 0.3 % ophthalmic solution Place 1 drop into the right eye 4 (four) times daily. 11/18/19  Yes [provider]  omeprazole (PRILOSEC) 40 MG capsule TAKE 1 CAPSULE (40 MG TOTAL) BY MOUTH TWO (TWO) TIMES DAILY. 12/15/19  Yes Thornton Park, MD  polyethylene  glycol (MIRALAX / GLYCOLAX) 17 g packet Take 17 g by mouth daily as needed for mild constipation. 08/07/19  Yes Rutherford Guys, MD  prednisoLONE acetate (PRED FORTE) 1 % ophthalmic suspension Place 1 drop into the left eye 4 (four) times daily. 11/18/19  Yes [provider]  vitamin E 100 UNIT capsule Take 100 Units by mouth daily.   Yes [provider]    Past Medical History:  Diagnosis Date  . Abnormal EKG 02/07/2016   Inferolateral T wave inversion and ST depression.  . Acute calculous cholecystitis   . Anxiety   . Arthritis   . Chronic diastolic CHF (congestive heart failure) ([place]) 02/21/2018  . Colon polyps  11/2008   tubular adenoma  . Diabetes mellitus   . Esophageal problem    esophageal dilation  . GERD (gastroesophageal reflux disease)    + hpylori  . GI bleeding 01/2018  . Headache   . Hyperlipidemia   . Hypertension   . Hypothyroidism   . Ischemic colitis ([place])   . Renal cancer, right ([place]) 2010   s/p Rt nephrectomy by Dr. Rosana Hoes  . Renal insufficiency     Past Surgical History:  Procedure Laterality Date  . ABDOMINAL HYSTERECTOMY  1978   partial  . CATARACT EXTRACTION    . CHOLECYSTECTOMY N/A 04/04/2016   Procedure: LAPAROSCOPIC CHOLECYSTECTOMY;  Surgeon: Autumn Messing III, MD;  Location: [facility];  Service: General;  Laterality: N/A;  . COLONOSCOPY    . ESOPHAGEAL DILATION  2016  . HERNIA REPAIR    . LAPAROSCOPIC CHOLECYSTECTOMY  04/04/2016  . NEPHRECTOMY Right 2010   Dr. Rosana Hoes, urology, due to renal cancer    Social History   Tobacco Use  . Smoking status: Current Some Day Smoker    Packs/day: 0.25    Years: 55.00    Pack years: 13.75    Types: Cigarettes  . Smokeless tobacco: Never Used  . Tobacco comment: cut back amount still  trying to quit  Substance Use Topics  . Alcohol use: No    Alcohol/week: 0.0 standard drinks    Family History  Problem Relation Age of Onset  . Cancer Mother        colon, kidney cancer  . Cancer Father         throat cancer  . Cancer Sister   . Heart attack Brother     ROS Per hpi  OBJECTIVE:  Today's Vitals   04/11/20 1546  BP: 138/70  Pulse: 83  Temp: 98 F (36.7 C)  SpO2: 97%  Weight: 177 lb (80.3 kg)  Height: 6' (1.829 m)   Body mass index is 24.01 kg/m.   Physical Exam Vitals and nursing note reviewed.  Constitutional:      Appearance: She is well-developed.  HENT:     Head: Normocephalic and atraumatic.     Mouth/Throat:     Pharynx: No oropharyngeal exudate.  Eyes:     General: No scleral icterus.    Conjunctiva/sclera: Conjunctivae normal.     Pupils: Pupils are equal, round, and reactive to light.  Cardiovascular:     Rate and Rhythm: Normal rate and regular rhythm.     Heart sounds: Normal heart sounds. No murmur. No friction rub. No gallop.   Pulmonary:     Effort: Pulmonary effort is normal.     Breath sounds: Normal breath sounds. No wheezing or rales.  Musculoskeletal:     Cervical back: Neck supple.     Right lower leg: Edema (+1 pitting edema distal LLE) present.     Left lower leg: Edema present.  Lymphadenopathy:     Cervical: No cervical adenopathy.  Skin:    General: Skin is warm and dry.  Neurological:     Mental Status: She is alert and oriented to person, place, and time.     No results found for this or any previous visit (from the past 24 hour(s)).  No results found.   ASSESSMENT and PLAN  1. Essential hypertension 2. Stage 3a chronic kidney disease 3. Chronic diastolic CHF (congestive heart failure) (HCC) Improved edema and BP with stable crt. increase lasix to 40mg  BID. BMP check in 2 weeks.  - Basic  Metabolic Panel; Future - furosemide (LASIX) 40 MG tablet; Take 1 tablet (40 mg total) by mouth 2 (two) times daily.  Return in about 4 weeks (around 05/09/2020) for HTN.    Rutherford Guys, MD Primary Care at [place], [place] [address] Ph.  [phone] Fax [phone]

## 2020-04-12 ENCOUNTER — Ambulatory Visit: Payer: Medicare Other

## 2020-04-12 ENCOUNTER — Ambulatory Visit: Payer: Medicare Other | Admitting: Podiatry

## 2020-04-12 ENCOUNTER — Ambulatory Visit (INDEPENDENT_AMBULATORY_CARE_PROVIDER_SITE_OTHER): Payer: Medicare Other

## 2020-04-12 ENCOUNTER — Encounter: Payer: Self-pay | Admitting: Podiatry

## 2020-04-12 ENCOUNTER — Other Ambulatory Visit: Payer: Self-pay

## 2020-04-12 DIAGNOSIS — T1490XA Injury, unspecified, initial encounter: Secondary | ICD-10-CM

## 2020-04-12 DIAGNOSIS — R609 Edema, unspecified: Secondary | ICD-10-CM

## 2020-04-12 DIAGNOSIS — L97419 Non-pressure chronic ulcer of right heel and midfoot with unspecified severity: Secondary | ICD-10-CM

## 2020-04-12 DIAGNOSIS — M79675 Pain in left toe(s): Secondary | ICD-10-CM | POA: Diagnosis not present

## 2020-04-12 DIAGNOSIS — M79674 Pain in right toe(s): Secondary | ICD-10-CM | POA: Diagnosis not present

## 2020-04-12 DIAGNOSIS — B351 Tinea unguium: Secondary | ICD-10-CM | POA: Diagnosis not present

## 2020-04-12 NOTE — Patient Instructions (Signed)

## 2020-04-18 ENCOUNTER — Other Ambulatory Visit: Payer: Self-pay | Admitting: Gastroenterology

## 2020-04-18 ENCOUNTER — Other Ambulatory Visit: Payer: Self-pay | Admitting: Family Medicine

## 2020-04-18 NOTE — Telephone Encounter (Signed)
Requested Prescriptions  Pending Prescriptions Disp Refills  . diclofenac Sodium (VOLTAREN) 1 % GEL [Pharmacy Med Name: DICLOFENAC SODIUM 1% GEL] 100 g 4    Sig: APPLY 4 G TOPICALLY 4 (FOUR) TIMES DAILY.     Analgesics:  Topicals Passed - 04/18/2020  4:51 PM      Passed - Valid encounter within last 12 months    Recent Outpatient Visits          1 week ago Essential hypertension   Primary Care at [facility], Lilia Argue, MD   2 weeks ago Chronic diastolic CHF (congestive heart failure) [facility])   [facility] at [facility], Lilia Argue, MD   3 weeks ago Type 2 diabetes mellitus with diabetic polyneuropathy, without long-term current use of insulin [facility])   [facility] at [facility], Lilia Argue, MD   2 months ago Essential hypertension   [facility] at [facility], Lilia Argue, MD   5 months ago Type 2 diabetes mellitus with diabetic polyneuropathy, without long-term current use of insulin [facility])   [facility] Care at [facility], Lilia Argue, MD      Future Appointments            In 3 weeks Rutherford Guys, MD Primary Care at [place], [facility]

## 2020-04-19 NOTE — Progress Notes (Signed)
Subjective:   Patient ID: Sherri Burns, female   DOB: 84 y.o.   MRN: [id number]   HPI 84 year old female presents the office today for concerns of pain in the back of her right heel worsening on the last 1 to 2 months.  She is that hurts at nighttime particularly with pressure to the area.  She also states that she hit her right ankle, lateral aspect about 4 to 5 months ago and has had several pain to this area as well as the redness.  She is been using Neosporin.  Hurts with pressure in shoes.  She also gets some occasional swelling to the left ankle.  No pain in the left ankle.  No injury to the left side.  Also asking for the nails be trimmed as they are thick and elongated she cannot do them herself.   Review of Systems  All other systems reviewed and are negative.  Past Medical History:  Diagnosis Date  . Abnormal EKG 02/07/2016   Inferolateral T wave inversion and ST depression.  . Acute calculous cholecystitis   . Anxiety   . Arthritis   . Chronic diastolic CHF (congestive heart failure) ([place]) 02/21/2018  . Colon polyps 11/2008   tubular adenoma  . Diabetes mellitus   . Esophageal problem    esophageal dilation  . GERD (gastroesophageal reflux disease)    + hpylori  . GI bleeding 01/2018  . Headache   . Hyperlipidemia   . Hypertension   . Hypothyroidism   . Ischemic colitis ([place])   . Renal cancer, right ([place]) 2010   s/p Rt nephrectomy by Dr. Rosana Hoes  . Renal insufficiency     Past Surgical History:  Procedure Laterality Date  . ABDOMINAL HYSTERECTOMY  1978   partial  . CATARACT EXTRACTION    . CHOLECYSTECTOMY N/A 04/04/2016   Procedure: LAPAROSCOPIC CHOLECYSTECTOMY;  Surgeon: Autumn Messing III, MD;  Location: [facility];  Service: General;  Laterality: N/A;  . COLONOSCOPY    . ESOPHAGEAL DILATION  2016  . HERNIA REPAIR    . LAPAROSCOPIC CHOLECYSTECTOMY  04/04/2016  . NEPHRECTOMY Right 2010   Dr. Rosana Hoes, urology, due to renal cancer     Current Outpatient Medications:   .  acetaminophen (TYLENOL) 500 MG tablet, Take 500 mg by mouth every 6 (six) hours as needed for moderate pain., Disp: , Rfl:  .  acetic acid 2 % otic solution, Place 4 drops into both ears 3 (three) times daily., Disp: 15 mL, Rfl: 0 .  amLODipine (NORVASC) 10 MG tablet, TAKE 1 TABLET (10 MG TOTAL) BY MOUTH DAILY., Disp: 90 tablet, Rfl: 1 .  Bacillus Coagulans-Inulin (ALIGN PREBIOTIC-PROBIOTIC PO), Take 1 capsule by mouth daily. , Disp: , Rfl:  .  brimonidine (ALPHAGAN) 0.2 % ophthalmic solution, Place 1 drop into the left eye 2 (two) times daily. , Disp: , Rfl:  .  cholecalciferol (VITAMIN D) 1000 units tablet, Take 1,000 Units by mouth daily., Disp: , Rfl:  .  clonazePAM (KLONOPIN) 1 MG tablet, TAKE HALF TABLET BY MOUTH EVERY MORNING AND TAKE ONE TABLET BY MOUTH AT BEDTIME, Disp: 45 tablet, Rfl: 4 .  Colloidal Oatmeal (ECZEMA MOISTURIZING) 1 % LOTN, Apply 1 application topically as needed. Eczema cream, Disp: , Rfl:  .  CYCLOGYL 2 % ophthalmic solution, Place 1 drop into the left eye 3 (three) times daily. , Disp: , Rfl:  .  diclofenac Sodium (VOLTAREN) 1 % GEL, APPLY 4 G TOPICALLY 4 (FOUR) TIMES DAILY., Disp: 100  g, Rfl: 4 .  fluticasone (FLONASE) 50 MCG/ACT nasal spray, PLACE 1-2 SPRAYS INTO BOTH NOSTRILS AT BEDTIME., Disp: 18 g, Rfl: 4 .  furosemide (LASIX) 40 MG tablet, Take 1 tablet (40 mg total) by mouth 2 (two) times daily., Disp: 90 tablet, Rfl: 0 .  glipiZIDE (GLUCOTROL) 10 MG tablet, TAKE TWO TABLETS BY MOUTH TWICE A DAY, Disp: 360 tablet, Rfl: 0 .  hydrocortisone (ANUSOL-HC) 25 MG suppository, Place 1 suppository (25 mg total) rectally 2 (two) times daily., Disp: 12 suppository, Rfl: 2 .  hydroxypropyl methylcellulose / hypromellose (ISOPTO TEARS / GONIOVISC) 2.5 % ophthalmic solution, Place 2 drops into both eyes 3 (three) times daily as needed for dry eyes., Disp: , Rfl:  .  ketorolac (ACULAR) 0.5 % ophthalmic solution, Place 1 drop into the left eye 4 (four) times daily. , Disp: ,  Rfl:  .  levothyroxine (SYNTHROID) 88 MCG tablet, TAKE ONE TABLET BY MOUTH DAILY BEFORE BREAKFAST, Disp: 90 tablet, Rfl: 3 .  metoprolol tartrate (LOPRESSOR) 100 MG tablet, TAKE ONE TABLET BY MOUTH TWICE A DAY, Disp: 180 tablet, Rfl: 0 .  ofloxacin (OCUFLOX) 0.3 % ophthalmic solution, Place 1 drop into the right eye 4 (four) times daily., Disp: , Rfl:  .  omeprazole (PRILOSEC) 40 MG capsule, TAKE ONE CAPSULE BY MOUTH TWICE A DAY, Disp: 120 capsule, Rfl: 0 .  polyethylene glycol (MIRALAX / GLYCOLAX) 17 g packet, Take 17 g by mouth daily as needed for mild constipation., Disp: 30 each, Rfl: 5 .  prednisoLONE acetate (PRED FORTE) 1 % ophthalmic suspension, Place 1 drop into the left eye 4 (four) times daily., Disp: , Rfl:  .  vitamin E 100 UNIT capsule, Take 100 Units by mouth daily., Disp: , Rfl:   Allergies  Allergen Reactions  . Ace Inhibitors Swelling    See 02/16/19   . Codeine Other (See Comments)    hallucinations  . Metformin And Related     Upset stomach  . Ciprocin-Fluocin-Procin [Fluocinolone Acetonide] Other (See Comments)    Unknown reaction  . Clonidine Derivatives Other (See Comments)    Dry mouth  . Crestor [Rosuvastatin Calcium] Other (See Comments)    cramps  . Penicillins Other (See Comments)    Has patient had a PCN reaction causing immediate rash, facial/tongue/throat swelling, SOB or lightheadedness with hypotension: no Has patient had a PCN reaction causing severe rash involving mucus membranes or skin necrosis: no Has patient had a PCN reaction that required hospitalization : no Has patient had a PCN reaction occurring within the last 10 years: no -Hallucinates If all of the above answers are "NO", then may proceed with Cephalosporin use.   . Simvastatin Other (See Comments)    Upset stomach   . Tessalon Perles Other (See Comments)    Gi upset           Objective:  Physical Exam  General: AAO x3, NAD  Dermatological: Nails are hypertrophic,  dystrophic, brittle, discolored, elongated 10. No surrounding redness or drainage. Tenderness nails 1-5 bilaterally. No open lesions or pre-ulcerative lesions are identified today.  No open lesions identified.  Slight erythema to the posterior heel but this is blanchable there is no warmth.  Vascular: Dorsalis Pedis artery and Posterior Tibial artery pedal pulses are 1/4 bilateral with immedate capillary fill time.  Mild edema to bilateral ankles but there is no erythema or warmth associated.  The swelling has not been worsening.  PThere is no pain with calf compression, swelling, warmth, erythema.  Neruologic: Grossly intact via light touch bilateral.   Musculoskeletal: Tenderness to the lateral malleolus as well as the posterior heel.  Gait: Unassisted, Nonantalgic.       Assessment:   84 year old female with right posterior heel pain, lateral ankle injury; ankle swelling; symptomatic onychomycosis     Plan:  -Treatment options discussed including all alternatives, risks, and complications -Etiology of symptoms were discussed -X-rays were obtained and reviewed with the patient.  No evidence of acute fracture, stress fracture or osteomyelitis identified. -In regards to the heel pain discussed gel offloading pad.  Elevation and floating the heels to avoid any pressure at nighttime.  Discussed traction exercise daily.  She can use Voltaren gel as needed.  Regards to the lateral ankle.  There is no skin breakdown.  Continue Silvadene gel as well uncomfortable however there hold off on this there is any open sores. -Ankle swelling which seems to be chronic.  There is no pain associated with the swelling to the left ankle.  There is no pain to the calf and I doubtful of a DVT. -Nails debrided [id number] without complications or bleeding   Trula Slade DPM

## 2020-05-04 ENCOUNTER — Other Ambulatory Visit: Payer: Self-pay | Admitting: Podiatry

## 2020-05-04 DIAGNOSIS — S99911A Unspecified injury of right ankle, initial encounter: Secondary | ICD-10-CM

## 2020-05-09 ENCOUNTER — Ambulatory Visit (INDEPENDENT_AMBULATORY_CARE_PROVIDER_SITE_OTHER): Payer: Medicare Other | Admitting: Family Medicine

## 2020-05-09 ENCOUNTER — Encounter: Payer: Self-pay | Admitting: Family Medicine

## 2020-05-09 ENCOUNTER — Other Ambulatory Visit: Payer: Self-pay

## 2020-05-09 VITALS — BP 134/78 | HR 83 | Temp 98.3°F | Ht 66.0 in | Wt 176.6 lb

## 2020-05-09 DIAGNOSIS — E785 Hyperlipidemia, unspecified: Secondary | ICD-10-CM

## 2020-05-09 DIAGNOSIS — E1142 Type 2 diabetes mellitus with diabetic polyneuropathy: Secondary | ICD-10-CM | POA: Diagnosis not present

## 2020-05-09 DIAGNOSIS — K219 Gastro-esophageal reflux disease without esophagitis: Secondary | ICD-10-CM | POA: Diagnosis not present

## 2020-05-09 DIAGNOSIS — R1310 Dysphagia, unspecified: Secondary | ICD-10-CM | POA: Diagnosis not present

## 2020-05-09 DIAGNOSIS — I10 Essential (primary) hypertension: Secondary | ICD-10-CM | POA: Diagnosis not present

## 2020-05-09 MED ORDER — FAMOTIDINE 20 MG PO TABS
20.0000 mg | ORAL_TABLET | Freq: Every day | ORAL | 1 refills | Status: DC
Start: 2020-05-09 — End: 2020-06-16

## 2020-05-09 MED ORDER — CLONAZEPAM 1 MG PO TABS: ORAL_TABLET | ORAL | 4 refills | Status: DC

## 2020-05-09 MED ORDER — METOPROLOL TARTRATE 100 MG PO TABS: 100.0000 mg | ORAL_TABLET | Freq: Two times a day (BID) | ORAL | 1 refills | Status: DC

## 2020-05-09 NOTE — Progress Notes (Signed)
6/14/20215:01 PM  Sherri Burns 1935-11-28, 84 y.o., female [id number]  Chief Complaint  Patient presents with  . Medical Management of Chronic Issues    3 m f/u     HPI:   Patient is a 84 y.o. female with past medical history significant for  DM2, CKD3, s/p nephrectomy, HTN, anxietyon long term bzd, hypothyroidism, HLPwith statin intolerance who presents today for followup   Last OV May 2021 - increased lasix BID  Overall doing well Struggles this time of year, as death anniversary of her son Needs refill of clonazepam, pmp reviewed PHQ9 noted = 6, denies SI She is tolerating lasix well  Having sensation of choking with larger bites and sometimes coughs when she takes her meds Feels choking sensation at base of throat No concerning weight loss Has to eat soft foods as she has no teeth She reports post nasal drip - has not been using flonase She also often has reflux at night She denies any sore throat, voice changes, problems with liquids EGD aug 2020 - normal esophagus Takes prilosec 40mg  BID     BP Readings from Last 3 Encounters:  05/09/20 134/78  04/11/20 138/70  03/24/20 (!) 148/80   Wt Readings from Last 3 Encounters:  05/09/20 176 lb 9.6 oz (80.1 kg)  04/11/20 177 lb (80.3 kg)  03/24/20 177 lb (80.3 kg)    Lab Results  Component Value Date   HGBA1C 7.0 (H) 11/09/2019   HGBA1C 7.4 (H) 08/07/2019   HGBA1C 7.1 (H) 05/06/2019   Lab Results  Component Value Date   MICROALBUR 0.7 10/02/2016   LDLCALC 131 (H) 11/09/2019   CREATININE 1.37 (H) 03/31/2020   Lab Results  Component Value Date   TSH 0.682 02/08/2020    Depression screen PHQ 2/9 04/11/2020 03/24/2020 02/08/2020  Decreased Interest 0 0 0  Down, Depressed, Hopeless 0 0 0  PHQ - 2 Score 0 0 0  Altered sleeping - - -  Tired, decreased energy - - -  Change in appetite - - -  Feeling bad or failure about yourself  - - -  Trouble concentrating - - -  Moving slowly or fidgety/restless  - - -  Suicidal thoughts - - -  PHQ-9 Score - - -  Difficult doing work/chores - - -  Some recent data might be hidden    Fall Risk  05/09/2020 04/11/2020 03/24/2020 02/08/2020 11/09/2019  Falls in the past year? 0 0 0 0 0  Number falls in past yr: 0 0 0 0 0  Injury with Fall? 0 0 0 0 0  Follow up Falls evaluation completed - Falls evaluation completed Falls evaluation completed -     Allergies  Allergen Reactions  . Ace Inhibitors Swelling    See 02/16/19   . Codeine Other (See Comments)    hallucinations  . Metformin And Related     Upset stomach  . Ciprocin-Fluocin-Procin [Fluocinolone Acetonide] Other (See Comments)    Unknown reaction  . Clonidine Derivatives Other (See Comments)    Dry mouth  . Crestor [Rosuvastatin Calcium] Other (See Comments)    cramps  . Penicillins Other (See Comments)    Has patient had a PCN reaction causing immediate rash, facial/tongue/throat swelling, SOB or lightheadedness with hypotension: no Has patient had a PCN reaction causing severe rash involving mucus membranes or skin necrosis: no Has patient had a PCN reaction that required hospitalization : no Has patient had a PCN reaction occurring within the last 10  years: no -Hallucinates If all of the above answers are "NO", then may proceed with Cephalosporin use.   . Simvastatin Other (See Comments)    Upset stomach   . Tessalon Perles Other (See Comments)    Gi upset     Prior to Admission medications   Medication Sig Start Date End Date Taking? Authorizing Provider  acetaminophen (TYLENOL) 500 MG tablet Take 500 mg by mouth every 6 (six) hours as needed for moderate pain.   Yes [provider]  acetic acid 2 % otic solution Place 4 drops into both ears 3 (three) times daily. 09/17/19  Yes Rutherford Guys, MD  amLODipine (NORVASC) 10 MG tablet TAKE 1 TABLET (10 MG TOTAL) BY MOUTH DAILY. 03/04/20  Yes Rutherford Guys, MD  cholecalciferol (VITAMIN D) 1000 units tablet Take 1,000  Units by mouth daily.   Yes [provider]  clonazePAM (KLONOPIN) 1 MG tablet TAKE HALF TABLET BY MOUTH EVERY MORNING AND TAKE ONE TABLET BY MOUTH AT BEDTIME 01/28/20  Yes Rutherford Guys, MD  Colloidal Oatmeal (ECZEMA MOISTURIZING) 1 % LOTN Apply 1 application topically as needed. Eczema cream   Yes [provider]  fluticasone (FLONASE) 50 MCG/ACT nasal spray PLACE 1-2 SPRAYS INTO BOTH NOSTRILS AT BEDTIME. 02/02/20  Yes Rutherford Guys, MD  furosemide (LASIX) 40 MG tablet Take 1 tablet (40 mg total) by mouth 2 (two) times daily. 04/11/20  Yes Rutherford Guys, MD  glipiZIDE (GLUCOTROL) 10 MG tablet TAKE TWO TABLETS BY MOUTH TWICE A DAY 12/29/19  Yes Rutherford Guys, MD  levothyroxine (SYNTHROID) 88 MCG tablet TAKE ONE TABLET BY MOUTH DAILY BEFORE BREAKFAST 03/04/20  Yes Rutherford Guys, MD  metoprolol tartrate (LOPRESSOR) 100 MG tablet TAKE ONE TABLET BY MOUTH TWICE A DAY 01/28/20  Yes Rutherford Guys, MD  omeprazole (PRILOSEC) 40 MG capsule TAKE ONE CAPSULE BY MOUTH TWICE A DAY 04/18/20  Yes Thornton Park, MD  polyethylene glycol (MIRALAX / GLYCOLAX) 17 g packet Take 17 g by mouth daily as needed for mild constipation. 08/07/19  Yes Rutherford Guys, MD  vitamin E 100 UNIT capsule Take 100 Units by mouth daily.   Yes [provider]  Bacillus Coagulans-Inulin (ALIGN PREBIOTIC-PROBIOTIC PO) Take 1 capsule by mouth daily.  Patient not taking: Reported on 05/09/2020    [provider]  brimonidine (ALPHAGAN) 0.2 % ophthalmic solution Place 1 drop into the left eye 2 (two) times daily.  Patient not taking: Reported on 05/09/2020 10/27/19   [provider]  CYCLOGYL 2 % ophthalmic solution Place 1 drop into the left eye 3 (three) times daily.  Patient not taking: Reported on 05/09/2020 10/27/19   [provider]  diclofenac Sodium (VOLTAREN) 1 % GEL APPLY 4 G TOPICALLY 4 (FOUR) TIMES DAILY. Patient not taking: Reported on 05/09/2020 04/18/20   Rutherford Guys, MD  hydrocortisone (ANUSOL-HC) 25 MG suppository Place 1 suppository (25 mg total) rectally 2 (two) times daily. Patient not taking: Reported on 05/09/2020 09/17/19   Rutherford Guys, MD  hydroxypropyl methylcellulose / hypromellose (ISOPTO TEARS / GONIOVISC) 2.5 % ophthalmic solution Place 2 drops into both eyes 3 (three) times daily as needed for dry eyes. Patient not taking: Reported on 05/09/2020    [provider]  ketorolac (ACULAR) 0.5 % ophthalmic solution Place 1 drop into the left eye 4 (four) times daily.  Patient not taking: Reported on 05/09/2020 10/27/19   [provider]  ofloxacin (OCUFLOX) 0.3 %  ophthalmic solution Place 1 drop into the right eye 4 (four) times daily. Patient not taking: Reported on 05/09/2020 11/18/19   [provider]  prednisoLONE acetate (PRED FORTE) 1 % ophthalmic suspension Place 1 drop into the left eye 4 (four) times daily. Patient not taking: Reported on 05/09/2020 11/18/19   [provider]  omeprazole (PRILOSEC) 40 MG capsule TAKE 1 CAPSULE (40 MG TOTAL) BY MOUTH TWO (TWO) TIMES DAILY. 12/15/19   Thornton Park, MD    Past Medical History:  Diagnosis Date  . Abnormal EKG 02/07/2016   Inferolateral T wave inversion and ST depression.  . Acute calculous cholecystitis   . Anxiety   . Arthritis   . Chronic diastolic CHF (congestive heart failure) ([place]) 02/21/2018  . Colon polyps 11/2008   tubular adenoma  . Diabetes mellitus   . Esophageal problem    esophageal dilation  . GERD (gastroesophageal reflux disease)    + hpylori  . GI bleeding 01/2018  . Headache   . Hyperlipidemia   . Hypertension   . Hypothyroidism   . Ischemic colitis ([place])   . Renal cancer, right ([place]) 2010   s/p Rt nephrectomy by Dr. Rosana Hoes  . Renal insufficiency     Past Surgical History:  Procedure Laterality Date  . ABDOMINAL HYSTERECTOMY  1978   partial  . CATARACT EXTRACTION    . CHOLECYSTECTOMY N/A 04/04/2016   Procedure:  LAPAROSCOPIC CHOLECYSTECTOMY;  Surgeon: Autumn Messing III, MD;  Location: [facility];  Service: General;  Laterality: N/A;  . COLONOSCOPY    . ESOPHAGEAL DILATION  2016  . HERNIA REPAIR    . LAPAROSCOPIC CHOLECYSTECTOMY  04/04/2016  . NEPHRECTOMY Right 2010   Dr. Rosana Hoes, urology, due to renal cancer    Social History   Tobacco Use  . Smoking status: Current Some Day Smoker    Packs/day: 0.25    Years: 55.00    Pack years: 13.75    Types: Cigarettes  . Smokeless tobacco: Never Used  . Tobacco comment: cut back amount still  trying to quit  Substance Use Topics  . Alcohol use: No    Alcohol/week: 0.0 standard drinks    Family History  Problem Relation Age of Onset  . Cancer Mother        colon, kidney cancer  . Cancer Father        throat cancer  . Cancer Sister   . Heart attack Brother     Review of Systems  Constitutional: Negative for chills and fever.  Respiratory: Negative for cough and shortness of breath.   Cardiovascular: Negative for chest pain, palpitations and leg swelling.  Gastrointestinal: Positive for abdominal pain (diffuse). Negative for blood in stool, melena, nausea and vomiting.  per hpi  OBJECTIVE:  Today's Vitals   05/09/20 1639  BP: 134/78  Pulse: 83  Temp: 98.3 F (36.8 C)  TempSrc: Temporal  SpO2: 93%  Weight: 176 lb 9.6 oz (80.1 kg)  Height: 5\' 6"  (1.676 m)   Body mass index is 28.5 kg/m.   Physical Exam Vitals and nursing note reviewed.  Constitutional:      Appearance: She is well-developed.  HENT:     Head: Normocephalic and atraumatic.     Mouth/Throat:     Pharynx: Posterior oropharyngeal erythema (cobblestoning) present. No oropharyngeal exudate.  Eyes:     General: No scleral icterus.    Extraocular Movements: Extraocular movements intact.     Conjunctiva/sclera: Conjunctivae normal.     Pupils: Pupils are  equal, round, and reactive to light.  Neck:     Thyroid: No thyroid mass or thyromegaly.     Trachea: Phonation normal.  No tracheal tenderness.  Cardiovascular:     Rate and Rhythm: Normal rate and regular rhythm.     Heart sounds: Normal heart sounds. No murmur heard.  No friction rub. No gallop.   Pulmonary:     Effort: Pulmonary effort is normal.     Breath sounds: Normal breath sounds. No wheezing, rhonchi or rales.  Musculoskeletal:     Cervical back: Neck supple.  Lymphadenopathy:     Cervical: No cervical adenopathy.  Skin:    General: Skin is warm and dry.  Neurological:     Mental Status: She is alert and oriented to person, place, and time.     No results found for this or any previous visit (from the past 24 hour(s)).  No results found.   ASSESSMENT and PLAN  1. Essential hypertension Controlled. Continue current regime.  - Comprehensive metabolic panel  2. Type 2 diabetes mellitus with diabetic polyneuropathy, without long-term current use of insulin ([place]) Checking labs today, medications will be adjusted as needed.  - Hemoglobin A1c  3. Hyperlipidemia, unspecified hyperlipidemia type Checking labs today, medications will be adjusted as needed.  - Lipid panel  4. Gastroesophageal reflux disease, unspecified whether esophagitis present 5. Dysphagia, unspecified type - SLP modified barium swallow; Future - add pepcid at bedtime, resume flonase at bedtime  Other orders - clonazePAM (KLONOPIN) 1 MG tablet; TAKE HALF TABLET BY MOUTH EVERY MORNING AND TAKE ONE TABLET BY MOUTH AT BEDTIME - metoprolol tartrate (LOPRESSOR) 100 MG tablet; Take 1 tablet (100 mg total) by mouth 2 (two) times daily. - famotidine (PEPCID) 20 MG tablet; Take 1 tablet (20 mg total) by mouth at bedtime.  Return in about 3 months (around 08/09/2020).    Rutherford Guys, MD [facility] [place], [place] [address] Ph.  [phone] Fax [phone]

## 2020-05-10 LAB — COMPREHENSIVE METABOLIC PANEL
ALT: 13 IU/L (ref 0–32)
AST: 16 IU/L (ref 0–40)
Albumin/Globulin Ratio: 1.6 (ref 1.2–2.2)
Albumin: 4.5 g/dL (ref 3.6–4.6)
Alkaline Phosphatase: 77 IU/L (ref 48–121)
BUN/Creatinine Ratio: 18 (ref 12–28)
BUN: 23 mg/dL (ref 8–27)
Bilirubin Total: 0.4 mg/dL (ref 0.0–1.2)
CO2: 25 mmol/L (ref 20–29)
Calcium: 9.6 mg/dL (ref 8.7–10.3)
Chloride: 103 mmol/L (ref 96–106)
Creatinine, Ser: 1.3 mg/dL — ABNORMAL HIGH (ref 0.57–1.00)
GFR calc Af Amer: 44 mL/min/{1.73_m2} — ABNORMAL LOW (ref >59)
GFR calc non Af Amer: 39 mL/min/{1.73_m2} — ABNORMAL LOW (ref >59)
Globulin, Total: 2.8 g/dL (ref 1.5–4.5)
Glucose: 107 mg/dL — ABNORMAL HIGH (ref 65–99)
Potassium: 4.1 mmol/L (ref 3.5–5.2)
Sodium: 144 mmol/L (ref 134–144)
Total Protein: 7.3 g/dL (ref 6.0–8.5)

## 2020-05-10 LAB — HEMOGLOBIN A1C
Est. average glucose Bld gHb Est-mCnc: 197 mg/dL
Hgb A1c MFr Bld: 8.5 % — ABNORMAL HIGH (ref 4.8–5.6)

## 2020-05-10 LAB — LIPID PANEL
Chol/HDL Ratio: 6.1 ratio — ABNORMAL HIGH (ref 0.0–4.4)
Cholesterol, Total: 250 mg/dL — ABNORMAL HIGH (ref 100–199)
HDL: 41 mg/dL (ref >39)
LDL Chol Calc (NIH): 136 mg/dL — ABNORMAL HIGH (ref 0–99)
Triglycerides: 399 mg/dL — ABNORMAL HIGH (ref 0–149)
VLDL Cholesterol Cal: 73 mg/dL — ABNORMAL HIGH (ref 5–40)

## 2020-05-13 ENCOUNTER — Telehealth: Payer: Self-pay

## 2020-05-13 MED ORDER — SITAGLIPTIN PHOSPHATE 50 MG PO TABS: 50.0000 mg | ORAL_TABLET | Freq: Every day | ORAL | 1 refills | Status: DC

## 2020-05-13 NOTE — Addendum Note (Signed)
Addended by: Rutherford Guys on: 05/13/2020 07:55 PM   Modules accepted: Orders

## 2020-05-13 NOTE — Telephone Encounter (Signed)
-----   Message from Rutherford Guys, MD sent at 05/12/2020  7:43 PM EDT ----- Please let patient know that her diabetes is not controlled. Her hemoglobin A1c is 8.5. She can work on her diet or we can change her medication from glipizide to trulicity which is a medication she would inject once a week?  What would she prefer to do. We should recheck her labs in 3 months.  Her kidney function is stable.  Her cholesterol is stable.

## 2020-05-13 NOTE — Telephone Encounter (Signed)
Ok no injections. I sent in a prescription for januvia 50mg  once a day. She will take this is addition to her glipizide. We will take more about diet at her next visit. She is more then welcome to come see me sooner to discuss further if she so wishes. thanks

## 2020-05-13 NOTE — Telephone Encounter (Signed)
I have called the pt and relayed the message to her.   She stated that she is not okay with needles and her nerves are shot. She can not handle it so what kind of diet is recommend to her. Pt reports that she doesn't eat any crazy foods. She has cereal one morning and a billy dean breakfast Sandwich another morning, she eats salads and fish. She is wondering what diet is best for her. She was not happy about the thought of needles.   Please advise on diet recommendations.

## 2020-05-16 ENCOUNTER — Other Ambulatory Visit: Payer: Self-pay | Admitting: Family Medicine

## 2020-05-16 NOTE — Telephone Encounter (Signed)
Pt says that she would rather take to [place] tablet instead of injecting herself with the needle.

## 2020-05-20 ENCOUNTER — Other Ambulatory Visit (HOSPITAL_COMMUNITY): Payer: Self-pay

## 2020-05-20 DIAGNOSIS — R1310 Dysphagia, unspecified: Secondary | ICD-10-CM

## 2020-05-26 ENCOUNTER — Ambulatory Visit (HOSPITAL_COMMUNITY)
Admission: RE | Admit: 2020-05-26 | Discharge: 2020-05-26 | Disposition: A | Discharge status: Home / Self Care | Payer: Medicare Other | Source: Ambulatory Visit | Attending: Family Medicine | Admitting: Family Medicine

## 2020-05-26 ENCOUNTER — Other Ambulatory Visit: Payer: Self-pay

## 2020-05-26 DIAGNOSIS — K219 Gastro-esophageal reflux disease without esophagitis: Secondary | ICD-10-CM

## 2020-05-26 DIAGNOSIS — R1310 Dysphagia, unspecified: Secondary | ICD-10-CM | POA: Diagnosis not present

## 2020-05-26 DIAGNOSIS — T17300A Unspecified foreign body in larynx causing asphyxiation, initial encounter: Secondary | ICD-10-CM | POA: Diagnosis not present

## 2020-05-26 NOTE — Progress Notes (Signed)
Modified Barium Swallow Progress Note  Patient Details  Name: Sherri Burns MRN: [id number] Date of Birth: [birth year]  Today's Date: 05/26/2020  Modified Barium Swallow completed.  Full report located under Chart Review in the Imaging Section.  Brief recommendations include the following:  Clinical Impression  Pt exhibited normal oropharyngeal swallow function with esophageal involvement in pt with known GERD. There was age appropriate and functional flash laryngeal penetration with thin that completely cleared during swallows. No difficulty propelling barium pill with thin nor mastication of cracker despite endentulous state. Esophageal scan revealed hesitation of pill into GE junction that entered into stomach given sips of thin and additional time. Suspect her symptoms are likely related to her previously diagnosed GER. If symptoms worsen further work up of esophagus could be beneficial as she does have history of esophageal dilation. Recommend she continue regular texture, thin liquids and strategies for GERD which were reviewed with pt verbally and handout given    Swallow Evaluation Recommendations       SLP Diet Recommendations: Regular solids;Thin liquid   Liquid Administration via: Cup;Straw   Medication Administration: Whole meds with liquid   Supervision: Patient able to self feed   Compensations: Follow solids with liquid;Small sips/bites   Postural Changes: Remain semi-upright after after feeds/meals (Comment);Seated upright at 90 degrees   Oral Care Recommendations: Oral care BID        Houston Siren 05/26/2020,3:53 PM   Orbie Pyo [place] Risk analyst [phone] Office [phone]

## 2020-05-31 ENCOUNTER — Other Ambulatory Visit: Payer: Self-pay

## 2020-05-31 ENCOUNTER — Emergency Department (HOSPITAL_COMMUNITY): Payer: Medicare Other

## 2020-05-31 ENCOUNTER — Observation Stay (HOSPITAL_COMMUNITY)
Admission: EM | Admit: 2020-05-31 | Discharge: 2020-06-02 | Disposition: A | Discharge status: Home / Self Care | Payer: Medicare Other | Attending: Internal Medicine | Admitting: Internal Medicine

## 2020-05-31 DIAGNOSIS — Z85528 Personal history of other malignant neoplasm of kidney: Secondary | ICD-10-CM | POA: Insufficient documentation

## 2020-05-31 DIAGNOSIS — E1142 Type 2 diabetes mellitus with diabetic polyneuropathy: Secondary | ICD-10-CM | POA: Diagnosis not present

## 2020-05-31 DIAGNOSIS — K922 Gastrointestinal hemorrhage, unspecified: Principal | ICD-10-CM | POA: Insufficient documentation

## 2020-05-31 DIAGNOSIS — I10 Essential (primary) hypertension: Secondary | ICD-10-CM | POA: Diagnosis present

## 2020-05-31 DIAGNOSIS — I5032 Chronic diastolic (congestive) heart failure: Secondary | ICD-10-CM | POA: Diagnosis not present

## 2020-05-31 DIAGNOSIS — Z7984 Long term (current) use of oral hypoglycemic drugs: Secondary | ICD-10-CM | POA: Insufficient documentation

## 2020-05-31 DIAGNOSIS — N183 Chronic kidney disease, stage 3 unspecified: Secondary | ICD-10-CM | POA: Diagnosis not present

## 2020-05-31 DIAGNOSIS — E039 Hypothyroidism, unspecified: Secondary | ICD-10-CM | POA: Diagnosis not present

## 2020-05-31 DIAGNOSIS — Z20822 Contact with and (suspected) exposure to covid-19: Secondary | ICD-10-CM | POA: Insufficient documentation

## 2020-05-31 DIAGNOSIS — Z79899 Other long term (current) drug therapy: Secondary | ICD-10-CM | POA: Diagnosis not present

## 2020-05-31 DIAGNOSIS — N1831 Chronic kidney disease, stage 3a: Secondary | ICD-10-CM

## 2020-05-31 DIAGNOSIS — I700 Atherosclerosis of aorta: Secondary | ICD-10-CM | POA: Diagnosis not present

## 2020-05-31 DIAGNOSIS — K5730 Diverticulosis of large intestine without perforation or abscess without bleeding: Secondary | ICD-10-CM | POA: Diagnosis not present

## 2020-05-31 DIAGNOSIS — K429 Umbilical hernia without obstruction or gangrene: Secondary | ICD-10-CM | POA: Diagnosis not present

## 2020-05-31 DIAGNOSIS — I130 Hypertensive heart and chronic kidney disease with heart failure and stage 1 through stage 4 chronic kidney disease, or unspecified chronic kidney disease: Secondary | ICD-10-CM | POA: Diagnosis not present

## 2020-05-31 DIAGNOSIS — E279 Disorder of adrenal gland, unspecified: Secondary | ICD-10-CM | POA: Diagnosis not present

## 2020-05-31 DIAGNOSIS — R195 Other fecal abnormalities: Secondary | ICD-10-CM | POA: Diagnosis present

## 2020-05-31 DIAGNOSIS — Z03818 Encounter for observation for suspected exposure to other biological agents ruled out: Secondary | ICD-10-CM | POA: Diagnosis not present

## 2020-05-31 DIAGNOSIS — E1122 Type 2 diabetes mellitus with diabetic chronic kidney disease: Secondary | ICD-10-CM | POA: Diagnosis not present

## 2020-05-31 LAB — COMPREHENSIVE METABOLIC PANEL
ALT: 18 U/L (ref 0–44)
AST: 35 U/L (ref 15–41)
Albumin: 3.9 g/dL (ref 3.5–5.0)
Alkaline Phosphatase: 60 U/L (ref 38–126)
Anion gap: 10 (ref 5–15)
BUN: 20 mg/dL (ref 8–23)
CO2: 28 mmol/L (ref 22–32)
Calcium: 9.3 mg/dL (ref 8.9–10.3)
Chloride: 101 mmol/L (ref 98–111)
Creatinine, Ser: 1.29 mg/dL — ABNORMAL HIGH (ref 0.44–1.00)
GFR calc Af Amer: 45 mL/min — ABNORMAL LOW (ref >60)
GFR calc non Af Amer: 39 mL/min — ABNORMAL LOW (ref >60)
Glucose, Bld: 185 mg/dL — ABNORMAL HIGH (ref 70–99)
Potassium: 4.3 mmol/L (ref 3.5–5.1)
Sodium: 139 mmol/L (ref 135–145)
Total Bilirubin: 1.6 mg/dL — ABNORMAL HIGH (ref 0.3–1.2)
Total Protein: 7 g/dL (ref 6.5–8.1)

## 2020-05-31 LAB — CBC WITH DIFFERENTIAL/PLATELET
Abs Immature Granulocytes: 0.04 10*3/uL (ref 0.00–0.07)
Basophils Absolute: 0.1 10*3/uL (ref 0.0–0.1)
Basophils Relative: 1 %
Eosinophils Absolute: 0.2 10*3/uL (ref 0.0–0.5)
Eosinophils Relative: 4 %
HCT: 40.9 % (ref 36.0–46.0)
Hemoglobin: 13 g/dL (ref 12.0–15.0)
Immature Granulocytes: 1 %
Lymphocytes Relative: 43 %
Lymphs Abs: 2.9 10*3/uL (ref 0.7–4.0)
MCH: 30.8 pg (ref 26.0–34.0)
MCHC: 31.8 g/dL (ref 30.0–36.0)
MCV: 96.9 fL (ref 80.0–100.0)
Monocytes Absolute: 0.5 10*3/uL (ref 0.1–1.0)
Monocytes Relative: 8 %
Neutro Abs: 2.9 10*3/uL (ref 1.7–7.7)
Neutrophils Relative %: 43 %
Platelets: 209 10*3/uL (ref 150–400)
RBC: 4.22 MIL/uL (ref 3.87–5.11)
RDW: 13.8 % (ref 11.5–15.5)
WBC: 6.6 10*3/uL (ref 4.0–10.5)
nRBC: 0 % (ref 0.0–0.2)

## 2020-05-31 LAB — HEMOGLOBIN AND HEMATOCRIT, BLOOD
HCT: 37.8 % (ref 36.0–46.0)
HCT: 38.9 % (ref 36.0–46.0)
Hemoglobin: 12.2 g/dL (ref 12.0–15.0)
Hemoglobin: 13 g/dL (ref 12.0–15.0)

## 2020-05-31 LAB — TYPE AND SCREEN
ABO/RH(D): O POS
Antibody Screen: NEGATIVE

## 2020-05-31 LAB — TROPONIN I (HIGH SENSITIVITY)
Troponin I (High Sensitivity): 10 ng/L (ref <18)
Troponin I (High Sensitivity): 9 ng/L (ref <18)

## 2020-05-31 LAB — SARS CORONAVIRUS 2 BY RT PCR (HOSPITAL ORDER, PERFORMED IN ~~LOC~~ HOSPITAL LAB): SARS Coronavirus 2: NEGATIVE

## 2020-05-31 LAB — GLUCOSE, CAPILLARY
Glucose-Capillary: 148 mg/dL — ABNORMAL HIGH (ref 70–99)
Glucose-Capillary: 191 mg/dL — ABNORMAL HIGH (ref 70–99)

## 2020-05-31 LAB — POC OCCULT BLOOD, ED: Fecal Occult Bld: POSITIVE — AB

## 2020-05-31 MED ORDER — CLONAZEPAM 0.5 MG PO TABS
0.5000 mg | ORAL_TABLET | Freq: Every day | ORAL | Status: DC
  Administered 2020-06-01 – 2020-06-02 (×2): 0.5 mg via ORAL
  Filled 2020-05-31 (×2): qty 1

## 2020-05-31 MED ORDER — ACETAMINOPHEN 650 MG RE SUPP: 650.0000 mg | Freq: Four times a day (QID) | RECTAL | Status: DC | PRN

## 2020-05-31 MED ORDER — FLUTICASONE PROPIONATE 50 MCG/ACT NA SUSP: 1.0000 | Freq: Two times a day (BID) | NASAL | Status: DC | PRN

## 2020-05-31 MED ORDER — PANTOPRAZOLE SODIUM 20 MG PO TBEC
20.0000 mg | DELAYED_RELEASE_TABLET | Freq: Every day | ORAL | Status: DC
  Administered 2020-06-01 – 2020-06-02 (×2): 20 mg via ORAL
  Filled 2020-05-31 (×2): qty 1

## 2020-05-31 MED ORDER — DICLOFENAC SODIUM 1 % EX GEL: 4.0000 g | Freq: Four times a day (QID) | CUTANEOUS | Status: DC | PRN

## 2020-05-31 MED ORDER — AMLODIPINE BESYLATE 10 MG PO TABS
10.0000 mg | ORAL_TABLET | Freq: Every day | ORAL | Status: DC
  Administered 2020-05-31 – 2020-06-02 (×3): 10 mg via ORAL
  Filled 2020-05-31 (×3): qty 1

## 2020-05-31 MED ORDER — CLONAZEPAM 0.5 MG PO TABS: 0.5000 mg | ORAL_TABLET | ORAL | Status: DC

## 2020-05-31 MED ORDER — ALBUTEROL SULFATE (2.5 MG/3ML) 0.083% IN NEBU: 2.5000 mg | INHALATION_SOLUTION | Freq: Four times a day (QID) | RESPIRATORY_TRACT | Status: DC | PRN

## 2020-05-31 MED ORDER — MORPHINE SULFATE (PF) 2 MG/ML IV SOLN
2.0000 mg | Freq: Once | INTRAVENOUS | Status: AC
  Administered 2020-05-31: 2 mg via INTRAVENOUS
  Filled 2020-05-31: qty 1

## 2020-05-31 MED ORDER — METOPROLOL TARTRATE 50 MG PO TABS
100.0000 mg | ORAL_TABLET | Freq: Two times a day (BID) | ORAL | Status: DC
  Administered 2020-05-31 – 2020-06-02 (×4): 100 mg via ORAL
  Filled 2020-05-31 (×5): qty 2

## 2020-05-31 MED ORDER — SODIUM CHLORIDE 0.9 % IV BOLUS
1000.0000 mL | Freq: Once | INTRAVENOUS | Status: AC
  Administered 2020-05-31: 1000 mL via INTRAVENOUS

## 2020-05-31 MED ORDER — FUROSEMIDE 40 MG PO TABS
40.0000 mg | ORAL_TABLET | Freq: Two times a day (BID) | ORAL | Status: DC
  Administered 2020-05-31 – 2020-06-02 (×4): 40 mg via ORAL
  Filled 2020-05-31 (×4): qty 1

## 2020-05-31 MED ORDER — LEVOTHYROXINE SODIUM 88 MCG PO TABS
88.0000 ug | ORAL_TABLET | Freq: Every day | ORAL | Status: DC
  Administered 2020-06-01 – 2020-06-02 (×2): 88 ug via ORAL
  Filled 2020-05-31 (×2): qty 1

## 2020-05-31 MED ORDER — ONDANSETRON HCL 4 MG/2ML IJ SOLN: 4.0000 mg | Freq: Four times a day (QID) | INTRAMUSCULAR | Status: DC | PRN

## 2020-05-31 MED ORDER — PANTOPRAZOLE SODIUM 40 MG PO TBEC: 40.0000 mg | DELAYED_RELEASE_TABLET | Freq: Every day | ORAL | Status: DC

## 2020-05-31 MED ORDER — INSULIN ASPART 100 UNIT/ML ~~LOC~~ SOLN
0.0000 [IU] | Freq: Three times a day (TID) | SUBCUTANEOUS | Status: DC
  Administered 2020-05-31: 1 [IU] via SUBCUTANEOUS
  Administered 2020-06-01 (×2): 2 [IU] via SUBCUTANEOUS
  Administered 2020-06-01: 1 [IU] via SUBCUTANEOUS
  Administered 2020-06-02: 2 [IU] via SUBCUTANEOUS

## 2020-05-31 MED ORDER — ACETAMINOPHEN 325 MG PO TABS
650.0000 mg | ORAL_TABLET | Freq: Four times a day (QID) | ORAL | Status: DC | PRN
  Administered 2020-05-31: 650 mg via ORAL
  Filled 2020-05-31: qty 2

## 2020-05-31 MED ORDER — PANTOPRAZOLE SODIUM 40 MG IV SOLR
40.0000 mg | Freq: Once | INTRAVENOUS | Status: AC
  Administered 2020-05-31: 40 mg via INTRAVENOUS
  Filled 2020-05-31: qty 40

## 2020-05-31 MED ORDER — IOHEXOL 300 MG/ML  SOLN
100.0000 mL | Freq: Once | INTRAMUSCULAR | Status: AC | PRN
  Administered 2020-05-31: 100 mL via INTRAVENOUS

## 2020-05-31 MED ORDER — ONDANSETRON HCL 4 MG PO TABS: 4.0000 mg | ORAL_TABLET | Freq: Four times a day (QID) | ORAL | Status: DC | PRN

## 2020-05-31 MED ORDER — CLONAZEPAM 0.5 MG PO TABS
1.0000 mg | ORAL_TABLET | Freq: Every day | ORAL | Status: DC
  Administered 2020-05-31 – 2020-06-01 (×2): 1 mg via ORAL
  Filled 2020-05-31 (×2): qty 2

## 2020-05-31 MED ORDER — HYPROMELLOSE (GONIOSCOPIC) 2.5 % OP SOLN: 1.0000 [drp] | Freq: Every day | OPHTHALMIC | Status: DC | PRN

## 2020-05-31 MED ORDER — SODIUM CHLORIDE 0.9% FLUSH
3.0000 mL | Freq: Two times a day (BID) | INTRAVENOUS | Status: DC
  Administered 2020-05-31 – 2020-06-02 (×5): 3 mL via INTRAVENOUS

## 2020-05-31 NOTE — H&P (Signed)
History and Physical    Sherri Burns:[id number] DOB: [birth year] DOA: 05/31/2020  Referring MD/NP/PA: Perlie Mayo, PA-C PCP: Rutherford Guys, MD  Patient coming from: Home  Chief Complaint: Blood in stools  I have personally briefly reviewed patient's old medical records in [place]   HPI: Sherri Burns is a 84 y.o. female with medical history significant of hypertension, hyperlipidemia, chronic diastolic CHF, DM type II, ischemic colitis, hypothyroidism, chronic kidney disease stage III, and renal cell carcinoma status post nephrectomy.  She presents with complaints of blood in her stools that started yesterday evening.  Patient reported blood was bright red in color and also a significant amount.  Then this morning she had a second bowel movement possible clots.  She reports having had 6 other episodes of diverticular bleeding in the past.  Noted associated symptoms of some abdominal bloating, lightheadedness, and anxiety.  She reports that she has had 6 prior episodes of blood in her stools that were related to history of diverticulosis for which she follows with Dr. Tarri Glenn of gastroenterology in outpatient setting.  Her last colonoscopy and endoscopy was on 07/14/2019 with Dr. Tarri Glenn noted gastric and colon polyps which were removed and nonbleeding internal hemorrhoids.  ED Course: Upon admission into the emergency department patient was noted to be afebrile with relatively stable vital signs.  Labs revealed hemoglobin 13, BUN 20, creatinine 1.29,  glucose 185, and total bilirubin 1.6.  Stool guaiacs were noted to be positive.  [facility] was formally consulted.  Patient had been given tonics 40 mg IV x1 dose, morphine, and 1 L normal saline IV fluids.  TRH called to admit.  Review of Systems  Constitutional: Positive for malaise/fatigue and weight loss (Purposely has been trying). Negative for fever.  HENT: Negative for congestion and nosebleeds.   Eyes: Negative  for photophobia and pain.  Respiratory: Negative for cough and shortness of breath.   Cardiovascular: Negative for chest pain and leg swelling.  Gastrointestinal: Positive for abdominal pain and blood in stool. Negative for nausea and vomiting.  Genitourinary: Negative for dysuria and hematuria.  Musculoskeletal: Negative for falls.  Skin: Negative for rash.  Neurological: Positive for dizziness and headaches. Negative for loss of consciousness.  Psychiatric/Behavioral: Negative for hallucinations. The patient is nervous/anxious.     Past Medical History:  Diagnosis Date  . Abnormal EKG 02/07/2016   Inferolateral T wave inversion and ST depression.  . Acute calculous cholecystitis   . Anxiety   . Arthritis   . Chronic diastolic CHF (congestive heart failure) ([place]) 02/21/2018  . Colon polyps 11/2008   tubular adenoma  . Diabetes mellitus   . Esophageal problem    esophageal dilation  . GERD (gastroesophageal reflux disease)    + hpylori  . GI bleeding 01/2018  . Headache   . Hyperlipidemia   . Hypertension   . Hypothyroidism   . Ischemic colitis ([place])   . Renal cancer, right ([place]) 2010   s/p Rt nephrectomy by Dr. Rosana Hoes  . Renal insufficiency     Past Surgical History:  Procedure Laterality Date  . ABDOMINAL HYSTERECTOMY  1978   partial  . CATARACT EXTRACTION    . CHOLECYSTECTOMY N/A 04/04/2016   Procedure: LAPAROSCOPIC CHOLECYSTECTOMY;  Surgeon: Autumn Messing III, MD;  Location: [facility];  Service: General;  Laterality: N/A;  . COLONOSCOPY    . ESOPHAGEAL DILATION  2016  . HERNIA REPAIR    . LAPAROSCOPIC CHOLECYSTECTOMY  04/04/2016  . NEPHRECTOMY Right  2010   Dr. Rosana Hoes, urology, due to renal cancer     reports that she has been smoking cigarettes. She has a 13.75 pack-year smoking history. She has never used smokeless tobacco. She reports that she does not drink alcohol and does not use drugs.  Allergies  Allergen Reactions  . Ace Inhibitors Swelling    See 02/16/19   .  Codeine Other (See Comments)    hallucinations  . Metformin And Related     Upset stomach  . Ciprocin-Fluocin-Procin [Fluocinolone Acetonide] Other (See Comments)    Unknown reaction  . Clonidine Derivatives Other (See Comments)    Dry mouth  . Crestor [Rosuvastatin Calcium] Other (See Comments)    cramps  . Penicillins Other (See Comments)    Has patient had a PCN reaction causing immediate rash, facial/tongue/throat swelling, SOB or lightheadedness with hypotension: no Has patient had a PCN reaction causing severe rash involving mucus membranes or skin necrosis: no Has patient had a PCN reaction that required hospitalization : no Has patient had a PCN reaction occurring within the last 10 years: no -Hallucinates If all of the above answers are "NO", then may proceed with Cephalosporin use.   . Simvastatin Other (See Comments)    Upset stomach   . Tessalon Perles Other (See Comments)    Gi upset     Family History  Problem Relation Age of Onset  . Cancer Mother        colon, kidney cancer  . Cancer Father        throat cancer  . Cancer Sister   . Heart attack Brother     Prior to Admission medications   Medication Sig Start Date End Date Taking? Authorizing Provider  acetaminophen (TYLENOL) 500 MG tablet Take 500 mg by mouth every 6 (six) hours as needed for moderate pain.    [provider]  acetic acid 2 % otic solution Place 4 drops into both ears 3 (three) times daily. 09/17/19   Rutherford Guys, MD  amLODipine (NORVASC) 10 MG tablet TAKE 1 TABLET (10 MG TOTAL) BY MOUTH DAILY. 03/04/20   Rutherford Guys, MD  Bacillus Coagulans-Inulin (ALIGN PREBIOTIC-PROBIOTIC PO) Take 1 capsule by mouth daily.  Patient not taking: Reported on 05/09/2020    [provider]  brimonidine (ALPHAGAN) 0.2 % ophthalmic solution Place 1 drop into the left eye 2 (two) times daily.  Patient not taking: Reported on 05/09/2020 10/27/19   [provider]  cholecalciferol  (VITAMIN D) 1000 units tablet Take 1,000 Units by mouth daily.    [provider]  clonazePAM (KLONOPIN) 1 MG tablet TAKE HALF TABLET BY MOUTH EVERY MORNING AND TAKE ONE TABLET BY MOUTH AT BEDTIME 05/09/20   Rutherford Guys, MD  Colloidal Oatmeal (ECZEMA MOISTURIZING) 1 % LOTN Apply 1 application topically as needed. Eczema cream    [provider]  CYCLOGYL 2 % ophthalmic solution Place 1 drop into the left eye 3 (three) times daily.  Patient not taking: Reported on 05/09/2020 10/27/19   [provider]  diclofenac Sodium (VOLTAREN) 1 % GEL APPLY 4 G TOPICALLY 4 (FOUR) TIMES DAILY. Patient not taking: Reported on 05/09/2020 04/18/20   Rutherford Guys, MD  famotidine (PEPCID) 20 MG tablet Take 1 tablet (20 mg total) by mouth at bedtime. 05/09/20   Rutherford Guys, MD  fluticasone [facility]) 50 MCG/ACT nasal spray PLACE 1-2 SPRAYS INTO BOTH NOSTRILS AT BEDTIME. 02/02/20   Rutherford Guys, MD  furosemide (LASIX)  40 MG tablet Take 1 tablet (40 mg total) by mouth 2 (two) times daily. 04/11/20   Rutherford Guys, MD  glipiZIDE (GLUCOTROL) 10 MG tablet TAKE TWO TABLETS BY MOUTH TWICE A DAY 05/16/20   Rutherford Guys, MD  hydrocortisone (ANUSOL-HC) 25 MG suppository Place 1 suppository (25 mg total) rectally 2 (two) times daily. Patient not taking: Reported on 05/09/2020 09/17/19   Rutherford Guys, MD  hydroxypropyl methylcellulose / hypromellose (ISOPTO TEARS / GONIOVISC) 2.5 % ophthalmic solution Place 2 drops into both eyes 3 (three) times daily as needed for dry eyes. Patient not taking: Reported on 05/09/2020    [provider]  ketorolac (ACULAR) 0.5 % ophthalmic solution Place 1 drop into the left eye 4 (four) times daily.  Patient not taking: Reported on 05/09/2020 10/27/19   [provider]  levothyroxine (SYNTHROID) 88 MCG tablet TAKE ONE TABLET BY MOUTH DAILY BEFORE BREAKFAST 03/04/20   Rutherford Guys, MD  metoprolol tartrate (LOPRESSOR) 100 MG tablet Take 1  tablet (100 mg total) by mouth 2 (two) times daily. 05/09/20   Rutherford Guys, MD  ofloxacin (OCUFLOX) 0.3 % ophthalmic solution Place 1 drop into the right eye 4 (four) times daily. Patient not taking: Reported on 05/09/2020 11/18/19   [provider]  omeprazole (PRILOSEC) 40 MG capsule TAKE ONE CAPSULE BY MOUTH TWICE A DAY 04/18/20   [place], MD  polyethylene glycol (MIRALAX / GLYCOLAX) 17 g packet Take 17 g by mouth daily as needed for mild constipation. 08/07/19   Rutherford Guys, MD  prednisoLONE acetate (PRED FORTE) 1 % ophthalmic suspension Place 1 drop into the left eye 4 (four) times daily.  11/18/19   [provider]  sitaGLIPtin (JANUVIA) 50 MG tablet Take 1 tablet (50 mg total) by mouth daily. 05/13/20   Rutherford Guys, MD  vitamin E 100 UNIT capsule Take 100 Units by mouth daily.    [provider]  omeprazole (PRILOSEC) 40 MG capsule TAKE 1 CAPSULE (40 MG TOTAL) BY MOUTH TWO (TWO) TIMES DAILY. 12/15/19   [place] Park, MD    Physical Exam:  Constitutional: Elderly female who appears to be in no acute distress at this time Vitals:   05/31/20 0800 05/31/20 0826 05/31/20 0831 05/31/20 0925  BP: 135/76 134/88  138/74  Pulse: 65 72 73 72  Resp: 19 (!) 25 16 18   Temp:      TempSrc:      SpO2: 96% 97% 97% 97%   Eyes: PERRL, lids and conjunctivae normal ENMT: Mucous membranes are moist. Posterior pharynx clear of any exudate or lesions.  Neck: normal, supple, no masses, no thyromegaly Respiratory: clear to auscultation bilaterally, no wheezing, no crackles. Normal respiratory effort. No accessory muscle use.  Cardiovascular: Regular rate and rhythm, no murmurs / rubs / gallops. No extremity edema. 2+ pedal pulses. No carotid bruits.  Abdomen: no tenderness, no masses palpated. No hepatosplenomegaly. Bowel sounds positive.  Musculoskeletal: no clubbing / cyanosis. No joint deformity upper and lower extremities. Good ROM, no contractures.  Normal muscle tone.  Skin: no rashes, lesions, ulcers. No induration Neurologic: CN 2-12 grossly intact. Sensation intact, DTR normal. Strength 5/5 in all 4.  Psychiatric: Normal judgment and insight. Alert and oriented x 3. Normal mood.     Labs on Admission: I have personally reviewed following labs and imaging studies  CBC: Recent Labs  Lab 05/31/20 0736  WBC 6.6  NEUTROABS 2.9  HGB 13.0  HCT 40.9  MCV  96.9  PLT 400   Basic Metabolic Panel: Recent Labs  Lab 05/31/20 0736  NA 139  K 4.3  CL 101  CO2 28  GLUCOSE 185*  BUN 20  CREATININE 1.29*  CALCIUM 9.3   GFR: CrCl cannot be calculated (Unknown ideal weight.). Liver Function Tests: Recent Labs  Lab 05/31/20 0736  AST 35  ALT 18  ALKPHOS 60  BILITOT 1.6*  PROT 7.0  ALBUMIN 3.9   No results for input(s): LIPASE, AMYLASE in the last 168 hours. No results for input(s): AMMONIA in the last 168 hours. Coagulation Profile: No results for input(s): INR, PROTIME in the last 168 hours. Cardiac Enzymes: No results for input(s): CKTOTAL, CKMB, CKMBINDEX, TROPONINI in the last 168 hours. BNP (last 3 results) No results for input(s): PROBNP in the last 8760 hours. HbA1C: No results for input(s): HGBA1C in the last 72 hours. CBG: No results for input(s): GLUCAP in the last 168 hours. Lipid Profile: No results for input(s): CHOL, HDL, LDLCALC, TRIG, CHOLHDL, LDLDIRECT in the last 72 hours. Thyroid Function Tests: No results for input(s): TSH, T4TOTAL, FREET4, T3FREE, THYROIDAB in the last 72 hours. Anemia Panel: No results for input(s): VITAMINB12, FOLATE, FERRITIN, TIBC, IRON, RETICCTPCT in the last 72 hours. Urine analysis:    Component Value Date/Time   COLORURINE YELLOW 12/08/2019 1945   APPEARANCEUR CLEAR 12/08/2019 1945   LABSPEC 1.010 12/08/2019 1945   PHURINE 6.0 12/08/2019 1945   GLUCOSEU >=500 (A) 12/08/2019 1945   HGBUR NEGATIVE 12/08/2019 1945   BILIRUBINUR NEGATIVE 12/08/2019 1945    BILIRUBINUR negative 08/25/2018 1001   BILIRUBINUR small 11/09/2014 1629   KETONESUR NEGATIVE 12/08/2019 1945   PROTEINUR NEGATIVE 12/08/2019 1945   UROBILINOGEN 0.2 08/25/2018 1001   UROBILINOGEN 0.2 04/28/2013 2321   NITRITE NEGATIVE 12/08/2019 1945   LEUKOCYTESUR NEGATIVE 12/08/2019 1945   Sepsis Labs: No results found for this or any previous visit (from the past 240 hour(s)).   Radiological Exams on Admission: CT ABDOMEN PELVIS W CONTRAST  Result Date: 05/31/2020 CLINICAL DATA:  Lower abdominal pain. EXAM: CT ABDOMEN AND PELVIS WITH CONTRAST TECHNIQUE: Multidetector CT imaging of the abdomen and pelvis was performed using the standard protocol following bolus administration of intravenous contrast. CONTRAST:  70 mL OMNIPAQUE IOHEXOL 300 MG/ML  SOLN COMPARISON:  December 09, 2019. FINDINGS: Lower chest: No acute abnormality. Hepatobiliary: No focal liver abnormality is seen. Status post cholecystectomy. No biliary dilatation. Pancreas: Unremarkable. No pancreatic ductal dilatation or surrounding inflammatory changes. Spleen: Normal in size without focal abnormality. Adrenals/Urinary Tract: Status post right nephrectomy. Right adrenal gland is not noted. Left adrenal gland and kidney are unremarkable. No hydronephrosis or renal obstruction is noted. No renal or ureteral calculi are noted. Urinary bladder is unremarkable. Stomach/Bowel: Stomach is within normal limits. Appendix appears normal. No evidence of bowel wall thickening, distention, or inflammatory changes. Sigmoid diverticulosis is noted without inflammation. Vascular/Lymphatic: Aortic atherosclerosis. No enlarged abdominal or pelvic lymph nodes. Reproductive: Status post hysterectomy. No adnexal masses. Other: Small fat containing periumbilical hernia is noted. No ascites is noted. Musculoskeletal: No acute or significant osseous findings. IMPRESSION: 1. Small fat containing periumbilical hernia. 2. Sigmoid diverticulosis without  inflammation. 3. Status post right nephrectomy. 4. No acute abnormality seen in the abdomen or pelvis. Aortic Atherosclerosis (ICD10-I70.0). Electronically Signed   By: Marijo Conception M.D.   On: 05/31/2020 09:17    EKG: Independently reviewed.  Sinus rhythm at 75 bpm  Assessment/Plan Lower GI bleed: Acute.  Patient presents with complaints of  having to rectal bleeding.  Hemoglobin appears relatively stable at 13.  Last colonoscopy from 07/14/2019 with Dr. Tarri Glenn noted polyps which were removed and nonbleeding internal hemorrhoids. -Admit to medical telemetry bed -Monitor intake and output -Clear liquid diet as tolerated -Serial monitoring of H&H, transfuse blood products as needed -[facility] consulted. Will follow-up for further recommendation  Essential hypertension: Home blood pressure medications include metoprolol 100 mg twice daily, furosemide 40 mg twice daily(patient reportedly takes these 30 minutes apart), and amlodipine 10 mg daily. -Continue home regimen as tolerated  Chronic kidney disease stage III: Patient with history of renal cancer status post nephrectomy.  Creatinine near patient's baseline of 1.2-1.3. -Continue to monitor  Hypothyroidism: Home medications include levothyroxine 88 mcg daily. -Continue levothyroxine  Diabetes mellitus type 2: Home medications include Januvia 50 mg daily.  Last hemoglobin A1c was 8.5 on 05/09/2020. -Hypoglycemic protocols -Hold Januvia -CBGs before every meal with sensitive SSI  Anxiety -Continue Klonopin as needed  Hyperbilirubinemia: Acute.  Total bilirubin mildly elevated at 1.6. -Recheck CMP in a.m.  GERD:  -Continue home regimen  DVT prophylaxis: SCDs Code Status: Full Family Communication: Patient's daughter updated over the phone Disposition Plan: Possible discharge home in 1 day if bleeding stops Consults called: GI Admission status: observation   Norval Morton MD [facility] Pager [phone]   If  7PM-7AM, please contact night-coverage [email or web address] Password [email or web address]  05/31/2020, 10:35 AM

## 2020-05-31 NOTE — ED Triage Notes (Signed)
Pt reports two episodes of blood in her stool, describes as dark. Generalized abdominal pain and back pain. Has felt dizzy and nauseated.

## 2020-05-31 NOTE — ED Notes (Signed)
Pt transported to CT ?

## 2020-05-31 NOTE — Consult Note (Addendum)
Consultation  Referring Provider: Dr. Tamala Julian    Primary Care Physician:  Rutherford Guys, MD Primary Gastroenterologist: Dr. Tarri Glenn    Reason for Consultation: Hematochezia            HPI:   Sherri Burns is a 84 y.o. female with a past medical history significant for hypertension, hyperlipidemia, chronic diastolic CHF, diabetes type 2, ischemic colitis and others listed below, who presented to the ED today with a complaint of hematochezia.    Today, the patient explains that yesterday she passed a fair amount of bright red blood in her stool, but then this morning at about 6 AM she passed another stool with a lot of maroon-colored blood and clots.  This was so much so that it scared her and she came to the ED.  Describes similar instances being diagnosed with diverticular bleeding.  At the same time she was having dizziness a headache and worsened generalized abdominal cramping.  Describes a degree of chronic abdominal pain which goes "from where I poop up to my throat", this seemed to get worse when she was having bleeding.  Since being in the ER has had no further bowel movements.    Denies fever, chills, weight loss, heartburn, reflux or symptoms that awaken her from sleep.  ED course: Hemoglobin 13, BUN 20, creatinine 1.29, stool guaiacs positive,  GI history: 08/26/2019 televisit for hematemesis: At that time discussed recent hematemesis without anemia without obvious source on endoscopy 07/14/2019, H. pylori negative gastritis, also discussed intermittent rectal bleeding due to internal hemorrhoids, history of polyps with last colonoscopy 07/13/2008 tubular adenomas and sigmoid diverticulosis  Past Medical History:  Diagnosis Date   Abnormal EKG 02/07/2016   Inferolateral T wave inversion and ST depression.   Acute calculous cholecystitis    Anxiety    Arthritis    Chronic diastolic CHF (congestive heart failure) ([place]) 02/21/2018   Colon polyps 11/2008   tubular adenoma    Diabetes mellitus    Esophageal problem    esophageal dilation   GERD (gastroesophageal reflux disease)    + hpylori   GI bleeding 01/2018   Headache    Hyperlipidemia    Hypertension    Hypothyroidism    Ischemic colitis [facility])    Renal cancer, right ([place]) 2010   s/p Rt nephrectomy by Dr. Rosana Hoes   Renal insufficiency     Past Surgical History:  Procedure Laterality Date   ABDOMINAL HYSTERECTOMY  1978   partial   CATARACT EXTRACTION     CHOLECYSTECTOMY N/A 04/04/2016   Procedure: LAPAROSCOPIC CHOLECYSTECTOMY;  Surgeon: Autumn Messing III, MD;  Location: [place];  Service: General;  Laterality: N/A;   COLONOSCOPY     ESOPHAGEAL DILATION  2016   HERNIA REPAIR     LAPAROSCOPIC CHOLECYSTECTOMY  04/04/2016   NEPHRECTOMY Right 2010   Dr. Rosana Hoes, urology, due to renal cancer    Family History  Problem Relation Age of Onset   Cancer Mother        colon, kidney cancer   Cancer Father        throat cancer   Cancer Sister    Heart attack Brother     Social History   Tobacco Use   Smoking status: Current Some Day Smoker    Packs/day: 0.25    Years: 55.00    Pack years: 13.75    Types: Cigarettes   Smokeless tobacco: Never Used   Tobacco comment: cut back amount still  trying  to quit  Vaping Use   Vaping Use: Never used  Substance Use Topics   Alcohol use: No    Alcohol/week: 0.0 standard drinks   Drug use: No    Prior to Admission medications   Medication Sig Start Date End Date Taking? Authorizing Provider  acetaminophen (TYLENOL) 500 MG tablet Take 500 mg by mouth every 6 (six) hours as needed for moderate pain.   Yes [provider]  cholecalciferol (VITAMIN D) 1000 units tablet Take 1,000 Units by mouth daily.   Yes [provider]  clonazePAM (KLONOPIN) 1 MG tablet TAKE HALF TABLET BY MOUTH EVERY MORNING AND TAKE ONE TABLET BY MOUTH AT BEDTIME Patient taking differently: Take 0.5-1 mg by mouth See admin instructions. Take  1/2 tablet (0.5mg ) by mouth daily in the morning and take 1 tablet (1mg ) by mouth daily at bedtime. 05/09/20  Yes Rutherford Guys, MD  diclofenac Sodium (VOLTAREN) 1 % GEL APPLY 4 G TOPICALLY 4 (FOUR) TIMES DAILY. Patient taking differently: Apply 4 g topically 4 (four) times daily as needed (joint pain).  04/18/20  Yes Rutherford Guys, MD  famotidine (PEPCID) 20 MG tablet Take 1 tablet (20 mg total) by mouth at bedtime. 05/09/20  Yes Rutherford Guys, MD  fluticasone (FLONASE) 50 MCG/ACT nasal spray PLACE 1-2 SPRAYS INTO BOTH NOSTRILS AT BEDTIME. Patient taking differently: Place 1-2 sprays into both nostrils 3 times/day as needed-between meals & bedtime for allergies.  02/02/20  Yes Rutherford Guys, MD  furosemide (LASIX) 40 MG tablet Take 1 tablet (40 mg total) by mouth 2 (two) times daily. 04/11/20  Yes Rutherford Guys, MD  Glycerin-Hypromellose-PEG 400 (DRY EYE RELIEF DROPS OP) Place 1 drop into the left eye daily as needed (dry eyes).   Yes [provider]  levothyroxine (SYNTHROID) 88 MCG tablet TAKE ONE TABLET BY MOUTH DAILY BEFORE BREAKFAST Patient taking differently: Take 88 mcg by mouth daily before breakfast.  03/04/20  Yes Rutherford Guys, MD  metoprolol tartrate (LOPRESSOR) 100 MG tablet Take 1 tablet (100 mg total) by mouth 2 (two) times daily. 05/09/20  Yes Rutherford Guys, MD  omeprazole (PRILOSEC) 40 MG capsule TAKE ONE CAPSULE BY MOUTH TWICE A DAY Patient taking differently: Take 40 mg by mouth in the morning and at bedtime.  04/18/20  Yes Thornton Park, MD  sitaGLIPtin (JANUVIA) 50 MG tablet Take 1 tablet (50 mg total) by mouth daily. Patient taking differently: Take 50 mg by mouth every morning.  05/13/20  Yes Rutherford Guys, MD  vitamin E 100 UNIT capsule Take 100 Units by mouth daily.   Yes [provider]  acetic acid 2 % otic solution Place 4 drops into both ears 3 (three) times daily. Patient not taking: Reported on 05/31/2020 09/17/19   Rutherford Guys,  MD  amLODipine (NORVASC) 10 MG tablet TAKE 1 TABLET (10 MG TOTAL) BY MOUTH DAILY. 03/04/20   Rutherford Guys, MD  Bacillus Coagulans-Inulin (ALIGN PREBIOTIC-PROBIOTIC PO) Take 1 capsule by mouth daily.  Patient not taking: Reported on 05/09/2020    [provider]  brimonidine (ALPHAGAN) 0.2 % ophthalmic solution Place 1 drop into the left eye 2 (two) times daily.  Patient not taking: Reported on 05/09/2020 10/27/19   [provider]  CYCLOGYL 2 % ophthalmic solution Place 1 drop into the left eye 3 (three) times daily.  Patient not taking: Reported on 05/09/2020 10/27/19   [provider]  glipiZIDE (GLUCOTROL) 10 MG tablet TAKE TWO  TABLETS BY MOUTH TWICE A DAY Patient not taking: Reported on 05/31/2020 05/16/20   Rutherford Guys, MD  hydrocortisone (ANUSOL-HC) 25 MG suppository Place 1 suppository (25 mg total) rectally 2 (two) times daily. Patient not taking: Reported on 05/09/2020 09/17/19   Rutherford Guys, MD  hydroxypropyl methylcellulose / hypromellose (ISOPTO TEARS / GONIOVISC) 2.5 % ophthalmic solution Place 2 drops into both eyes 3 (three) times daily as needed for dry eyes. Patient not taking: Reported on 05/09/2020    [provider]  ketorolac (ACULAR) 0.5 % ophthalmic solution Place 1 drop into the left eye 4 (four) times daily.  Patient not taking: Reported on 05/09/2020 10/27/19   [provider]  ofloxacin (OCUFLOX) 0.3 % ophthalmic solution Place 1 drop into the right eye 4 (four) times daily. Patient not taking: Reported on 05/09/2020 11/18/19   [provider]  polyethylene glycol (MIRALAX / GLYCOLAX) 17 g packet Take 17 g by mouth daily as needed for mild constipation. Patient not taking: Reported on 05/31/2020 08/07/19   Rutherford Guys, MD  omeprazole (PRILOSEC) 40 MG capsule TAKE 1 CAPSULE (40 MG TOTAL) BY MOUTH TWO (TWO) TIMES DAILY. 12/15/19   Thornton Park, MD    Current Facility-Administered Medications  Medication Dose  Route Frequency Provider Last Rate Last Admin   acetaminophen (TYLENOL) tablet 650 mg  650 mg Oral Q6H PRN Norval Morton, MD       Or   acetaminophen (TYLENOL) suppository 650 mg  650 mg Rectal Q6H PRN Fuller Plan A, MD       albuterol (PROVENTIL) (2.5 MG/3ML) 0.083% nebulizer solution 2.5 mg  2.5 mg Nebulization Q6H PRN Tamala Julian, Rondell A, MD       ondansetron (ZOFRAN) tablet 4 mg  4 mg Oral Q6H PRN Fuller Plan A, MD       Or   ondansetron (ZOFRAN) injection 4 mg  4 mg Intravenous Q6H PRN Smith, Rondell A, MD       sodium chloride flush (NS) 0.9 % injection 3 mL  3 mL Intravenous Q12H Smith, Rondell A, MD       Current Outpatient Medications  Medication Sig Dispense Refill   acetaminophen (TYLENOL) 500 MG tablet Take 500 mg by mouth every 6 (six) hours as needed for moderate pain.     cholecalciferol (VITAMIN D) 1000 units tablet Take 1,000 Units by mouth daily.     clonazePAM (KLONOPIN) 1 MG tablet TAKE HALF TABLET BY MOUTH EVERY MORNING AND TAKE ONE TABLET BY MOUTH AT BEDTIME (Patient taking differently: Take 0.5-1 mg by mouth See admin instructions. Take 1/2 tablet (0.5mg ) by mouth daily in the morning and take 1 tablet (1mg ) by mouth daily at bedtime.) 45 tablet 4   diclofenac Sodium (VOLTAREN) 1 % GEL APPLY 4 G TOPICALLY 4 (FOUR) TIMES DAILY. (Patient taking differently: Apply 4 g topically 4 (four) times daily as needed (joint pain). ) 100 g 4   famotidine (PEPCID) 20 MG tablet Take 1 tablet (20 mg total) by mouth at bedtime. 90 tablet 1   fluticasone (FLONASE) 50 MCG/ACT nasal spray PLACE 1-2 SPRAYS INTO BOTH NOSTRILS AT BEDTIME. (Patient taking differently: Place 1-2 sprays into both nostrils 3 times/day as needed-between meals & bedtime for allergies. ) 18 g 4   furosemide (LASIX) 40 MG tablet Take 1 tablet (40 mg total) by mouth 2 (two) times daily. 90 tablet 0   Glycerin-Hypromellose-PEG 400 (DRY EYE RELIEF DROPS OP) Place 1 drop into the left eye daily  as needed  (dry eyes).     levothyroxine (SYNTHROID) 88 MCG tablet TAKE ONE TABLET BY MOUTH DAILY BEFORE BREAKFAST (Patient taking differently: Take 88 mcg by mouth daily before breakfast. ) 90 tablet 3   metoprolol tartrate (LOPRESSOR) 100 MG tablet Take 1 tablet (100 mg total) by mouth 2 (two) times daily. 180 tablet 1   omeprazole (PRILOSEC) 40 MG capsule TAKE ONE CAPSULE BY MOUTH TWICE A DAY (Patient taking differently: Take 40 mg by mouth in the morning and at bedtime. ) 120 capsule 0   sitaGLIPtin (JANUVIA) 50 MG tablet Take 1 tablet (50 mg total) by mouth daily. (Patient taking differently: Take 50 mg by mouth every morning. ) 90 tablet 1   vitamin E 100 UNIT capsule Take 100 Units by mouth daily.     acetic acid 2 % otic solution Place 4 drops into both ears 3 (three) times daily. (Patient not taking: Reported on 05/31/2020) 15 mL 0   amLODipine (NORVASC) 10 MG tablet TAKE 1 TABLET (10 MG TOTAL) BY MOUTH DAILY. 90 tablet 1   Bacillus Coagulans-Inulin (ALIGN PREBIOTIC-PROBIOTIC PO) Take 1 capsule by mouth daily.  (Patient not taking: Reported on 05/09/2020)     brimonidine (ALPHAGAN) 0.2 % ophthalmic solution Place 1 drop into the left eye 2 (two) times daily.  (Patient not taking: Reported on 05/09/2020)     CYCLOGYL 2 % ophthalmic solution Place 1 drop into the left eye 3 (three) times daily.  (Patient not taking: Reported on 05/09/2020)     glipiZIDE (GLUCOTROL) 10 MG tablet TAKE TWO TABLETS BY MOUTH TWICE A DAY (Patient not taking: Reported on 05/31/2020) 360 tablet 0   hydrocortisone (ANUSOL-HC) 25 MG suppository Place 1 suppository (25 mg total) rectally 2 (two) times daily. (Patient not taking: Reported on 05/09/2020) 12 suppository 2   hydroxypropyl methylcellulose / hypromellose (ISOPTO TEARS / GONIOVISC) 2.5 % ophthalmic solution Place 2 drops into both eyes 3 (three) times daily as needed for dry eyes. (Patient not taking: Reported on 05/09/2020)     ketorolac (ACULAR) 0.5 % ophthalmic  solution Place 1 drop into the left eye 4 (four) times daily.  (Patient not taking: Reported on 05/09/2020)     ofloxacin (OCUFLOX) 0.3 % ophthalmic solution Place 1 drop into the right eye 4 (four) times daily. (Patient not taking: Reported on 05/09/2020)     polyethylene glycol (MIRALAX / GLYCOLAX) 17 g packet Take 17 g by mouth daily as needed for mild constipation. (Patient not taking: Reported on 05/31/2020) 30 each 5    Allergies as of 05/31/2020 - Review Complete 05/31/2020  Allergen Reaction Noted   Ace inhibitors Swelling 02/16/2019   Codeine Other (See Comments) 01/17/2012   Metformin and related  02/20/2018   Ciprocin-fluocin-procin [fluocinolone acetonide] Other (See Comments) 01/17/2012   Clonidine derivatives Other (See Comments) 01/17/2012   Crestor [rosuvastatin calcium] Other (See Comments) 07/28/2015   Penicillins Other (See Comments) 01/17/2012   Simvastatin Other (See Comments) 04/21/2013   Tessalon perles Other (See Comments) 01/17/2012     Review of Systems:    Constitutional: No weight loss, fever or chills Skin: No rash  Cardiovascular: No chest pain Respiratory: No SOB Gastrointestinal: See HPI and otherwise negative Genitourinary: No dysuria Neurological: No headache, dizziness or syncope Musculoskeletal: No new muscle or joint pain Hematologic: No bruising Psychiatric: No history of depression or anxiety    Physical Exam:  Vital signs in last 24 hours: Temp:  [98 F (36.7 C)] 98 F (36.7 C) (  07/06 0655) Pulse Rate:  [65-77] 69 (07/06 1045) Resp:  [15-25] 15 (07/06 1045) BP: (134-155)/(74-96) 149/78 (07/06 1045) SpO2:  [96 %-99 %] 99 % (07/06 1045)   General:   Pleasant overweight AA feamle appears to be in NAD, Well developed, Well nourished, alert and cooperative Head:  Normocephalic and atraumatic. Eyes:   PEERL, EOMI. No icterus. Conjunctiva pink. Ears:  Normal auditory acuity. Neck:  Supple Throat: Oral cavity and pharynx without  inflammation, swelling or lesion.  Lungs: Respirations even and unlabored. Lungs clear to auscultation bilaterally.   No wheezes, crackles, or rhonchi.  Heart: Normal S1, S2. No MRG. Regular rate and rhythm. No peripheral edema, cyanosis or pallor.  Abdomen:  Soft, nondistended, mild generalized ttp, No rebound or guarding. Normal bowel sounds. No appreciable masses or hepatomegaly. Rectal:  Not performed.  Msk:  Symmetrical without gross deformities. Peripheral pulses intact.  Extremities:  Without edema, no deformity or joint abnormality. Neurologic:  Alert and  oriented x4;  grossly normal neurologically.   Skin:   Dry and intact without significant lesions or rashes. Psychiatric: Demonstrates good judgement and reason without abnormal affect or behaviors.   LAB RESULTS: Recent Labs    05/31/20 0736  WBC 6.6  HGB 13.0  HCT 40.9  PLT 209   BMET Recent Labs    05/31/20 0736  NA 139  K 4.3  CL 101  CO2 28  GLUCOSE 185*  BUN 20  CREATININE 1.29*  CALCIUM 9.3   LFT Recent Labs    05/31/20 0736  PROT 7.0  ALBUMIN 3.9  AST 35  ALT 18  ALKPHOS 60  BILITOT 1.6*    STUDIES: CT ABDOMEN PELVIS W CONTRAST  Result Date: 05/31/2020 CLINICAL DATA:  Lower abdominal pain. EXAM: CT ABDOMEN AND PELVIS WITH CONTRAST TECHNIQUE: Multidetector CT imaging of the abdomen and pelvis was performed using the standard protocol following bolus administration of intravenous contrast. CONTRAST:  70 mL OMNIPAQUE IOHEXOL 300 MG/ML  SOLN COMPARISON:  December 09, 2019. FINDINGS: Lower chest: No acute abnormality. Hepatobiliary: No focal liver abnormality is seen. Status post cholecystectomy. No biliary dilatation. Pancreas: Unremarkable. No pancreatic ductal dilatation or surrounding inflammatory changes. Spleen: Normal in size without focal abnormality. Adrenals/Urinary Tract: Status post right nephrectomy. Right adrenal gland is not noted. Left adrenal gland and kidney are unremarkable. No  hydronephrosis or renal obstruction is noted. No renal or ureteral calculi are noted. Urinary bladder is unremarkable. Stomach/Bowel: Stomach is within normal limits. Appendix appears normal. No evidence of bowel wall thickening, distention, or inflammatory changes. Sigmoid diverticulosis is noted without inflammation. Vascular/Lymphatic: Aortic atherosclerosis. No enlarged abdominal or pelvic lymph nodes. Reproductive: Status post hysterectomy. No adnexal masses. Other: Small fat containing periumbilical hernia is noted. No ascites is noted. Musculoskeletal: No acute or significant osseous findings. IMPRESSION: 1. Small fat containing periumbilical hernia. 2. Sigmoid diverticulosis without inflammation. 3. Status post right nephrectomy. 4. No acute abnormality seen in the abdomen or pelvis. Aortic Atherosclerosis (ICD10-I70.0). Electronically Signed   By: Marijo Conception M.D.   On: 05/31/2020 09:17    Impression / Plan:   Impression: 1.  Hematochezia: Two episodes, the last at 6 AM 05/31/2020 was maroon with clots, history of diverticular bleeding in the past, chronic generalized abdominal pain, some worse recently; consider most likely diverticular bleed versus ischemic colitis versus other 2.  CKD stage III 3.  Chronic abdominal pain: Slight increase recently, likely IBS  Plan: 1.  Plans for observation with continued monitoring of hemoglobin with  transfusion less than 7.  If the patient has no further bleeding over the next 24 hours then she can likely be discharged tomorrow. 2.  Continue to monitor hemoglobin with transfusion as needed less than seven 3.  Continue clear liquid diet 4.  Please await any further recommendations from Dr. Lyndel Safe later today.  Thank you for your kind consultation, we will continue to follow.  Lavone Nian [facility]  05/31/2020, 11:18 AM   Attending physician's note   I have taken an interval history, reviewed the chart and examined the patient. I agree with the  Advanced Practitioner's note, impression and recommendations.   Painless hematochezia, likely d/t diverticular bleed. Hb stable. No further bleeding. Neg CT AP with contrast today. Neg colon 06/2019 except for small polyps. Chronic abdominal pain- neg CT, neg EGD/colon CKD3 with H/O right nephrectomy (d/t RCC) Associated comorbid conditions- HTN, HLD, dCHF, DM2.   Plan: -Advance diet. -Trend CBC. -Can continue Protonix 20mg  po qd as outpt (helped with abdo pain) -She is not keen on getting repeat endoscopic eval. I do agree as she had EGD/colonoscopy recently as above.  Hence, conservative management. -If no further bleeding, can D/C home in AM with GI FU    Sherri Austria, MD [facility] [phone].

## 2020-05-31 NOTE — ED Notes (Signed)
Pt states feeling "a little woozy headed" once sitting up during orthostatic vitals.

## 2020-05-31 NOTE — ED Provider Notes (Signed)
[facility] EMERGENCY DEPARTMENT Provider Note   CSN: [id number] Arrival date & time: 05/31/20  9562     History Chief Complaint  Patient presents with  . Blood In Stools    Sherri Burns is a 84 y.o. female history includes anxiety, CHF, diabetes, GERD, GI bleeding, hyperlipidemia, hypertension, ischemic colitis, renal cancer status post nephrectomy, hysterectomy, cholecystectomy.  Patient presents today following 2 episodes of dark bloody stools first episode was last night, small amount in the toilet.  Second episode was this morning shortly after taking her morning medications, large amount.  She reports that since waking up this morning she has had increased abdominal pain, she reports chronic abdominal pain which is mild however over the last several hours it has become a moderate intensity constant aching generalized nonradiating worsened with palpation no alleviating factors.  Associated with nausea without vomiting.  Patient reports she is also felt lightheaded and generally weak since waking up this morning.  Denies fall/injury, fever/chills, headache/vision changes, speech difficulty, focal/unilateral weakness, chest pain/shortness of breath, vomiting, dysuria/hematuria, extremity swelling/color change or any additional concerns.  HPI     Past Medical History:  Diagnosis Date  . Abnormal EKG 02/07/2016   Inferolateral T wave inversion and ST depression.  . Acute calculous cholecystitis   . Anxiety   . Arthritis   . Chronic diastolic CHF (congestive heart failure) ([place]) 02/21/2018  . Colon polyps 11/2008   tubular adenoma  . Diabetes mellitus   . Esophageal problem    esophageal dilation  . GERD (gastroesophageal reflux disease)    + hpylori  . GI bleeding 01/2018  . Headache   . Hyperlipidemia   . Hypertension   . Hypothyroidism   . Ischemic colitis ([place])   . Renal cancer, right ([place]) 2010   s/p Rt nephrectomy by Dr. Rosana Hoes  . Renal  insufficiency     Patient Active Problem List   Diagnosis Date Noted  . Lower GI bleed 05/31/2020  . Statin intolerance 11/12/2019  . GAD (generalized anxiety disorder) 08/07/2019  . Long term prescription benzodiazepine use 08/07/2019  . GI bleed 04/10/2019  . S/p nephrectomy 06/24/2018  . Chronic diastolic CHF (congestive heart failure) ([place]) 02/21/2018  . Acute lower GI bleeding 02/20/2018  . Hyperlipidemia   . Abnormal EKG 02/07/2016  . Type 2 diabetes mellitus with diabetic polyneuropathy, without long-term current use of insulin ([place]) 09/23/2012  . Essential hypertension   . Hypothyroidism   . CKD (chronic kidney disease), stage III     Past Surgical History:  Procedure Laterality Date  . ABDOMINAL HYSTERECTOMY  1978   partial  . CATARACT EXTRACTION    . CHOLECYSTECTOMY N/A 04/04/2016   Procedure: LAPAROSCOPIC CHOLECYSTECTOMY;  Surgeon: Autumn Messing III, MD;  Location: [place];  Service: General;  Laterality: N/A;  . COLONOSCOPY    . ESOPHAGEAL DILATION  2016  . HERNIA REPAIR    . LAPAROSCOPIC CHOLECYSTECTOMY  04/04/2016  . NEPHRECTOMY Right 2010   Dr. Rosana Hoes, urology, due to renal cancer     OB History   No obstetric history on file.     Family History  Problem Relation Age of Onset  . Cancer Mother        colon, kidney cancer  . Cancer Father        throat cancer  . Cancer Sister   . Heart attack Brother     Social History   Tobacco Use  . Smoking status: Current Some Day Smoker  Packs/day: 0.25    Years: 55.00    Pack years: 13.75    Types: Cigarettes  . Smokeless tobacco: Never Used  . Tobacco comment: cut back amount still  trying to quit  Vaping Use  . Vaping Use: Never used  Substance Use Topics  . Alcohol use: No    Alcohol/week: 0.0 standard drinks  . Drug use: No    Home Medications Prior to Admission medications   Medication Sig Start Date End Date Taking? Authorizing Provider  acetaminophen (TYLENOL) 500 MG tablet Take 500 mg by  mouth every 6 (six) hours as needed for moderate pain.   Yes [provider]  cholecalciferol (VITAMIN D) 1000 units tablet Take 1,000 Units by mouth daily.   Yes [provider]  clonazePAM (KLONOPIN) 1 MG tablet TAKE HALF TABLET BY MOUTH EVERY MORNING AND TAKE ONE TABLET BY MOUTH AT BEDTIME Patient taking differently: Take 0.5-1 mg by mouth See admin instructions. Take 1/2 tablet (0.5mg ) by mouth daily in the morning and take 1 tablet (1mg ) by mouth daily at bedtime. 05/09/20  Yes Rutherford Guys, MD  Colloidal Oatmeal (ECZEMA MOISTURIZING) 1 % LOTN Apply 1 application topically as needed. Eczema cream   Yes [provider]  diclofenac Sodium (VOLTAREN) 1 % GEL APPLY 4 G TOPICALLY 4 (FOUR) TIMES DAILY. Patient taking differently: Apply 4 g topically 4 (four) times daily as needed (joint pain).  04/18/20  Yes Rutherford Guys, MD  furosemide (LASIX) 40 MG tablet Take 1 tablet (40 mg total) by mouth 2 (two) times daily. 04/11/20  Yes Rutherford Guys, MD  acetic acid 2 % otic solution Place 4 drops into both ears 3 (three) times daily. Patient not taking: Reported on 05/31/2020 09/17/19   Rutherford Guys, MD  amLODipine (NORVASC) 10 MG tablet TAKE 1 TABLET (10 MG TOTAL) BY MOUTH DAILY. 03/04/20   Rutherford Guys, MD  Bacillus Coagulans-Inulin (ALIGN PREBIOTIC-PROBIOTIC PO) Take 1 capsule by mouth daily.  Patient not taking: Reported on 05/09/2020    [provider]  brimonidine (ALPHAGAN) 0.2 % ophthalmic solution Place 1 drop into the left eye 2 (two) times daily.  Patient not taking: Reported on 05/09/2020 10/27/19   [provider]  CYCLOGYL 2 % ophthalmic solution Place 1 drop into the left eye 3 (three) times daily.  Patient not taking: Reported on 05/09/2020 10/27/19   [provider]  famotidine (PEPCID) 20 MG tablet Take 1 tablet (20 mg total) by mouth at bedtime. 05/09/20   Rutherford Guys, MD  fluticasone (FLONASE) 50 MCG/ACT nasal spray PLACE  1-2 SPRAYS INTO BOTH NOSTRILS AT BEDTIME. Patient taking differently: Place 1-2 sprays into both nostrils 3 times/day as needed-between meals & bedtime for allergies.  02/02/20   Rutherford Guys, MD  glipiZIDE (GLUCOTROL) 10 MG tablet TAKE TWO TABLETS BY MOUTH TWICE A DAY 05/16/20   Rutherford Guys, MD  hydrocortisone (ANUSOL-HC) 25 MG suppository Place 1 suppository (25 mg total) rectally 2 (two) times daily. Patient not taking: Reported on 05/09/2020 09/17/19   Rutherford Guys, MD  hydroxypropyl methylcellulose / hypromellose (ISOPTO TEARS / GONIOVISC) 2.5 % ophthalmic solution Place 2 drops into both eyes 3 (three) times daily as needed for dry eyes. Patient not taking: Reported on 05/09/2020    [provider]  ketorolac (ACULAR) 0.5 % ophthalmic solution Place 1 drop into the left eye 4 (four) times daily.  Patient not taking: Reported on 05/09/2020 10/27/19   [provider]  levothyroxine (SYNTHROID) 88 MCG tablet TAKE ONE TABLET BY MOUTH DAILY BEFORE BREAKFAST 03/04/20   Rutherford Guys, MD  metoprolol tartrate (LOPRESSOR) 100 MG tablet Take 1 tablet (100 mg total) by mouth 2 (two) times daily. 05/09/20   Rutherford Guys, MD  ofloxacin (OCUFLOX) 0.3 % ophthalmic solution Place 1 drop into the right eye 4 (four) times daily. Patient not taking: Reported on 05/09/2020 11/18/19   [provider]  omeprazole (PRILOSEC) 40 MG capsule TAKE ONE CAPSULE BY MOUTH TWICE A DAY 04/18/20   Thornton Park, MD  polyethylene glycol (MIRALAX / GLYCOLAX) 17 g packet Take 17 g by mouth daily as needed for mild constipation. 08/07/19   Rutherford Guys, MD  prednisoLONE acetate (PRED FORTE) 1 % ophthalmic suspension Place 1 drop into the left eye 4 (four) times daily.  11/18/19   [provider]  sitaGLIPtin (JANUVIA) 50 MG tablet Take 1 tablet (50 mg total) by mouth daily. 05/13/20   Rutherford Guys, MD  vitamin E 100 UNIT capsule Take 100 Units by mouth daily.    [provider]  omeprazole (PRILOSEC) 40 MG capsule TAKE 1 CAPSULE (40 MG TOTAL) BY MOUTH TWO (TWO) TIMES DAILY. 12/15/19   Thornton Park, MD    Allergies    Ace inhibitors, Codeine, Metformin and related, Ciprocin-fluocin-procin [fluocinolone acetonide], Clonidine derivatives, Crestor [rosuvastatin calcium], Penicillins, Simvastatin, and Tessalon perles  Review of Systems   Review of Systems Ten systems are reviewed and are negative for acute change except as noted in the HPI Physical Exam Updated Vital Signs BP (!) 149/78   Pulse 69   Temp 98 F (36.7 C) (Oral)   Resp 15   SpO2 99%   Physical Exam Constitutional:      General: She is not in acute distress.    Appearance: Normal appearance. She is well-developed. She is not ill-appearing or diaphoretic.  HENT:     Head: Normocephalic and atraumatic.  Eyes:     General: Vision grossly intact. Gaze aligned appropriately.     Pupils: Pupils are equal, round, and reactive to light.  Neck:     Trachea: Trachea and phonation normal.  Pulmonary:     Effort: Pulmonary effort is normal. No respiratory distress.  Abdominal:     General: There is no distension.     Palpations: Abdomen is soft.     Tenderness: There is generalized abdominal tenderness. There is no guarding or rebound.  Genitourinary:    Comments: Rectal examination chaperoned by [facility] RN.  Large nonthrombosed, nonbleeding external hemorrhoids present without evidence of infection.  Normal rectal tone.  No palpable internal hemorrhoids or abnormalities.  No gross blood on examination. Musculoskeletal:        General: Normal range of motion.     Cervical back: Normal range of motion.  Skin:    General: Skin is warm and dry.  Neurological:     Mental Status: She is alert.     GCS: GCS eye subscore is 4. GCS verbal subscore is 5. GCS motor subscore is 6.     Comments: Speech is clear and goal oriented, follows commands Major Cranial nerves without deficit, no  facial droop Moves extremities without ataxia, coordination intact  Psychiatric:        Behavior: Behavior normal.     ED Results / Procedures / Treatments   Labs (all labs ordered are listed, but only abnormal results are displayed) Labs Reviewed  COMPREHENSIVE METABOLIC PANEL -  Abnormal; Notable for the following components:      Result Value   Glucose, Bld 185 (*)    Creatinine, Ser 1.29 (*)    Total Bilirubin 1.6 (*)    GFR calc non Af Amer 39 (*)    GFR calc Af Amer 45 (*)    All other components within normal limits  POC OCCULT BLOOD, ED - Abnormal; Notable for the following components:   Fecal Occult Bld POSITIVE (*)    All other components within normal limits  SARS CORONAVIRUS 2 BY RT PCR (HOSPITAL ORDER, [facility])  CBC WITH DIFFERENTIAL/PLATELET  HEMOGLOBIN AND HEMATOCRIT, BLOOD  HEMOGLOBIN AND HEMATOCRIT, BLOOD  TYPE AND SCREEN  TROPONIN I (HIGH SENSITIVITY)  TROPONIN I (HIGH SENSITIVITY)    EKG EKG Interpretation  Date/Time:  Tuesday May 31 2020 07:36:46 EDT Ventricular Rate:  75 PR Interval:    QRS Duration: 110 QT Interval:  391 QTC Calculation: 437 R Axis:   -22 Text Interpretation: Sinus rhythm Borderline left axis deviation Borderline repolarization abnormality No significant change since last tracing Confirmed by Davonna Belling [phone]) on 05/31/2020 9:44:38 AM   Radiology CT ABDOMEN PELVIS W CONTRAST  Result Date: 05/31/2020 CLINICAL DATA:  Lower abdominal pain. EXAM: CT ABDOMEN AND PELVIS WITH CONTRAST TECHNIQUE: Multidetector CT imaging of the abdomen and pelvis was performed using the standard protocol following bolus administration of intravenous contrast. CONTRAST:  70 mL OMNIPAQUE IOHEXOL 300 MG/ML  SOLN COMPARISON:  December 09, 2019. FINDINGS: Lower chest: No acute abnormality. Hepatobiliary: No focal liver abnormality is seen. Status post cholecystectomy. No biliary dilatation. Pancreas: Unremarkable. No pancreatic  ductal dilatation or surrounding inflammatory changes. Spleen: Normal in size without focal abnormality. Adrenals/Urinary Tract: Status post right nephrectomy. Right adrenal gland is not noted. Left adrenal gland and kidney are unremarkable. No hydronephrosis or renal obstruction is noted. No renal or ureteral calculi are noted. Urinary bladder is unremarkable. Stomach/Bowel: Stomach is within normal limits. Appendix appears normal. No evidence of bowel wall thickening, distention, or inflammatory changes. Sigmoid diverticulosis is noted without inflammation. Vascular/Lymphatic: Aortic atherosclerosis. No enlarged abdominal or pelvic lymph nodes. Reproductive: Status post hysterectomy. No adnexal masses. Other: Small fat containing periumbilical hernia is noted. No ascites is noted. Musculoskeletal: No acute or significant osseous findings. IMPRESSION: 1. Small fat containing periumbilical hernia. 2. Sigmoid diverticulosis without inflammation. 3. Status post right nephrectomy. 4. No acute abnormality seen in the abdomen or pelvis. Aortic Atherosclerosis (ICD10-I70.0). Electronically Signed   By: Marijo Conception M.D.   On: 05/31/2020 09:17    Procedures Procedures (including critical care time)  Medications Ordered in ED Medications  sodium chloride flush (NS) 0.9 % injection 3 mL (has no administration in time range)  acetaminophen (TYLENOL) tablet 650 mg (has no administration in time range)    Or  acetaminophen (TYLENOL) suppository 650 mg (has no administration in time range)  ondansetron (ZOFRAN) tablet 4 mg (has no administration in time range)    Or  ondansetron (ZOFRAN) injection 4 mg (has no administration in time range)  albuterol (PROVENTIL) (2.5 MG/3ML) 0.083% nebulizer solution 2.5 mg (has no administration in time range)  sodium chloride 0.9 % bolus 1,000 mL (0 mLs Intravenous Stopped 05/31/20 0941)  morphine 2 MG/ML injection 2 mg (2 mg Intravenous Given 05/31/20 0810)  pantoprazole  (PROTONIX) injection 40 mg (40 mg Intravenous Given 05/31/20 0815)  iohexol (OMNIPAQUE) 300 MG/ML solution 100 mL (100 mLs Intravenous Contrast Given 05/31/20 0904)    ED  Course  I have reviewed the triage vital signs and the nursing notes.  Pertinent labs & imaging results that were available during my care of the patient were reviewed by me and considered in my medical decision making (see chart for details).  Clinical Course as of May 31 1105  Tue May 31, 2020  [place]   [BM]  [id number] Dr. Tamala Julian   [BM]    Clinical Course User Index [BM] Gari Crown   MDM Rules/Calculators/A&P                          Additional History Obtained: 1. Nursing notes from this visit. 2. EMR reviewed, patient had colonoscopy and upper endoscopy on 07/14/2019 with Dr. Tarri Glenn.  It appears findings included gastritis.  Multiple precancerous polyps were removed.  Patient omeprazole was increased to 40 mg twice daily x8 weeks. 3. Patient also had a office visit with Dr. Tarri Glenn on 08/26/2019.  Impression of recent hematemesis without obvious source on endoscopy, H. pylori negative gastritis.  Intermittent asymmetric rectal bleeding due to internal hemorrhoids, appears they were dilated empirically during previous colonoscopy. ----------- I ordered, reviewed and interpreted labs which include: High-sensitivity troponin within normal limits x2 Hemoccult positive CBC within normal limits, no anemia or leukocytosis to suggest infection CMP shows baseline creatinine of 1.29, BUN within normal limits, no emergent electrolyte derangement, evidence of AKI, emergent elevation of LFTs or gap. Type and screen O+.  CT abdomen pelvis:  IMPRESSION:  1. Small fat containing periumbilical hernia.  2. Sigmoid diverticulosis without inflammation.  3. Status post right nephrectomy.  4. No acute abnormality seen in the abdomen or pelvis.     EKG: Sinus rhythm Borderline left axis deviation Borderline  repolarization abnormality No significant change since last tracing Confirmed by Davonna Belling [phone]) on 05/31/2020 9:44:38 AM  Orthostatics negative.  Patient was treated with 1 L fluid bolus, 40 mg Protonix and 2 mg of morphine.  She was reassessed multiple times, remains stable with reassuring vital signs.  Given patient's age and Hemoccult positive stool consult was placed to gastroenterology. - Discussed case with [place] [facility], Ellouise Newer PA-C, advises admission to hospitalist service, GI to see patient today.  Discussed case with Dr. Tamala Julian, patient has been accepted to hospitalist service.  Patient again reevaluated resting comfortably in bed no acute distress, she states understanding of care plan and is agreeable for admission.  Note: Portions of this report may have been transcribed using voice recognition software. Every effort was made to ensure accuracy; however, inadvertent computerized transcription errors may still be present. Final Clinical Impression(s) / ED Diagnoses Final diagnoses:  Lower GI bleed    Rx / DC Orders ED Discharge Orders    None       Gari Crown 05/31/20 1106    Davonna Belling, MD 05/31/20 1541

## 2020-05-31 NOTE — ED Notes (Signed)
Lunch tray ordered 

## 2020-06-01 DIAGNOSIS — E038 Other specified hypothyroidism: Secondary | ICD-10-CM | POA: Diagnosis not present

## 2020-06-01 DIAGNOSIS — N1831 Chronic kidney disease, stage 3a: Secondary | ICD-10-CM | POA: Diagnosis not present

## 2020-06-01 DIAGNOSIS — I10 Essential (primary) hypertension: Secondary | ICD-10-CM | POA: Diagnosis not present

## 2020-06-01 DIAGNOSIS — E1165 Type 2 diabetes mellitus with hyperglycemia: Secondary | ICD-10-CM

## 2020-06-01 DIAGNOSIS — E118 Type 2 diabetes mellitus with unspecified complications: Secondary | ICD-10-CM

## 2020-06-01 DIAGNOSIS — E1142 Type 2 diabetes mellitus with diabetic polyneuropathy: Secondary | ICD-10-CM | POA: Diagnosis not present

## 2020-06-01 DIAGNOSIS — K922 Gastrointestinal hemorrhage, unspecified: Secondary | ICD-10-CM | POA: Diagnosis not present

## 2020-06-01 LAB — BASIC METABOLIC PANEL
Anion gap: 10 (ref 5–15)
BUN: 12 mg/dL (ref 8–23)
CO2: 29 mmol/L (ref 22–32)
Calcium: 8.9 mg/dL (ref 8.9–10.3)
Chloride: 101 mmol/L (ref 98–111)
Creatinine, Ser: 1.15 mg/dL — ABNORMAL HIGH (ref 0.44–1.00)
GFR calc Af Amer: 52 mL/min — ABNORMAL LOW (ref >60)
GFR calc non Af Amer: 45 mL/min — ABNORMAL LOW (ref >60)
Glucose, Bld: 159 mg/dL — ABNORMAL HIGH (ref 70–99)
Potassium: 3 mmol/L — ABNORMAL LOW (ref 3.5–5.1)
Sodium: 140 mmol/L (ref 135–145)

## 2020-06-01 LAB — CBC
HCT: 37.8 % (ref 36.0–46.0)
Hemoglobin: 12.3 g/dL (ref 12.0–15.0)
MCH: 31.4 pg (ref 26.0–34.0)
MCHC: 32.5 g/dL (ref 30.0–36.0)
MCV: 96.4 fL (ref 80.0–100.0)
Platelets: 189 10*3/uL (ref 150–400)
RBC: 3.92 MIL/uL (ref 3.87–5.11)
RDW: 13.8 % (ref 11.5–15.5)
WBC: 5.1 10*3/uL (ref 4.0–10.5)
nRBC: 0 % (ref 0.0–0.2)

## 2020-06-01 LAB — GLUCOSE, CAPILLARY
Glucose-Capillary: 166 mg/dL — ABNORMAL HIGH (ref 70–99)
Glucose-Capillary: 149 mg/dL — ABNORMAL HIGH (ref 70–99)
Glucose-Capillary: 182 mg/dL — ABNORMAL HIGH (ref 70–99)
Glucose-Capillary: 160 mg/dL — ABNORMAL HIGH (ref 70–99)

## 2020-06-01 MED ORDER — POLYETHYLENE GLYCOL 3350 17 G PO PACK
17.0000 g | PACK | Freq: Every day | ORAL | Status: DC | PRN
  Administered 2020-06-01: 17 g via ORAL
  Filled 2020-06-01: qty 1

## 2020-06-01 MED ORDER — POTASSIUM CHLORIDE CRYS ER 20 MEQ PO TBCR
40.0000 meq | EXTENDED_RELEASE_TABLET | Freq: Two times a day (BID) | ORAL | Status: DC
  Administered 2020-06-01 – 2020-06-02 (×3): 40 meq via ORAL
  Filled 2020-06-01 (×3): qty 2

## 2020-06-01 NOTE — Progress Notes (Signed)
PROGRESS NOTE    GAYNOR GENCO  WUJ:[id number] DOB: 1936-06-03 DOA: 05/31/2020 PCP: Rutherford Guys, MD     Brief Narrative:  84 y.o. BF PMHx HTN, HLD, chronic diastolic CHF, ischemic colitis, Hypothyroidism, CKD stage III, Renal cell carcinoma s/p nephrectomy.    Presents with complaints of blood in her stools that started yesterday evening.  Patient reported blood was bright red in color and also a significant amount.  Then this morning she had a second bowel movement possible clots.  She reports having had 6 other episodes of diverticular bleeding in the past.  Noted associated symptoms of some abdominal bloating, lightheadedness, and anxiety.  She reports that she has had 6 prior episodes of blood in her stools that were related to history of diverticulosis for which she follows with Dr. Tarri Glenn of gastroenterology in outpatient setting.  Her last colonoscopy and endoscopy was on 07/14/2019 with Dr. Tarri Glenn noted gastric and colon polyps which were removed and nonbleeding internal hemorrhoids.  ED Course: Upon admission into the emergency department patient was noted to be afebrile with relatively stable vital signs.  Labs revealed hemoglobin 13, BUN 20, creatinine 1.29,  glucose 185, and total bilirubin 1.6.  Stool guaiacs were noted to be positive.  [facility] was formally consulted.  Patient had been given tonics 40 mg IV x1 dose, morphine, and 1 L normal saline IV fluids.  TRH called to admit.   Subjective: A/O x4, negative CP, negative abdominal pain, negative N/V     Assessment & Plan: Covid vaccination; positive vaccination   Principal Problem:   Lower GI bleed Active Problems:   Essential hypertension   Hypothyroidism   CKD (chronic kidney disease), stage III   Type 2 diabetes mellitus with diabetic polyneuropathy, without long-term current use of insulin (HCC)  Acute lower GI bleed. -Last colonoscopy from 07/14/2019 with Dr. Tarri Glenn noted polyps which were removed  and nonbleeding internal hemorrhoids. -Per GI most likely secondary to diverticular hemorrhoid bleed.  No procedure performed -Monitor H/H Recent Labs  Lab 05/31/20 0736 05/31/20 1445 05/31/20 1750 06/01/20 0450  HGB 13.0 12.2 13.0 12.3  -Protonix 20 mg daily -MiraLAX PRN -Stable -Diet advanced to carb modified if patient tolerates overnight without any further GI symptoms discharge in A.m.  Chronic diastolic CHF -Strict in and out -Daily weight -Amlodipine 10 mg daily -Lasix 40 mg BID -Metoprolol 100 mg BID  Essential HTN -See CHF  CKD stage III (baseline Cr 1.2 -1.3)  Recent Labs  Lab 05/31/20 0736 06/01/20 0450  CREATININE 1.29* 1.15*  -At baseline  Hypothyroidism -Levothyroxine 88 mcg daily   DM type II uncontrolled with complication -9/56 hemoglobin A1c= 8.5 -: Home medications include Januvia 50 mg daily.  Last hemoglobin A1c was 8.5 on 05/09/2020. --Per EMR patient not taking prescribed DM medication.  Prior to discharge will need to determine why given her uncontrolled diabetes. -Sensitive SSI  Anxiety -Continue Klonopin as needed  Hyperbilirubinemia: Acute.   -Total bilirubin mildly elevated at 1.6. -Recheck in a.m.  GERD:  -Continue home regimen  Hypokalemia -Potassium p.o. 40 mEq bid   DVT prophylaxis: SCD Code Status: Full Family Communication:  Status is: Inpatient    Dispo: The patient is from: Home              Anticipated d/c is to: Home              Anticipated d/c date is: 7/8  Patient currently unstable      Consultants:  [facility]  Procedures/Significant Events:    I have personally reviewed and interpreted all radiology studies and my findings are as above.  VENTILATOR SETTINGS:    Cultures   Antimicrobials:    Devices    LINES / TUBES:      Continuous Infusions:   Objective: Vitals:   05/31/20 2346 06/01/20 0458 06/01/20 0813 06/01/20 1201  BP: 110/72 131/76 123/76 119/76   Pulse: 67 66 70 72  Resp: 18 19  18   Temp: 97.9 F (36.6 C) 98 F (36.7 C)  98.3 F (36.8 C)  TempSrc: Oral Oral  Oral  SpO2: 96% 97%  98%    Intake/Output Summary (Last 24 hours) at 06/01/2020 1432 Last data filed at 06/01/2020 0500 Gross per 24 hour  Intake 240 ml  Output --  Net 240 ml   There were no vitals filed for this visit.  Examination:  General: A/O x4, no acute respiratory distress Eyes: negative scleral hemorrhage, negative anisocoria, negative icterus ENT: Negative Runny nose, negative gingival bleeding, Neck:  Negative scars, masses, torticollis, lymphadenopathy, JVD Lungs: Clear to auscultation bilaterally without wheezes or crackles Cardiovascular: Regular rate and rhythm without murmur gallop or rub normal S1 and S2 Abdomen: Positive mild abdominal pain to palpation, nondistended, positive soft, bowel sounds, no rebound, no ascites, no appreciable mass Extremities: No significant cyanosis, clubbing, or edema bilateral lower extremities Skin: Negative rashes, lesions, ulcers Psychiatric:  Negative depression, negative anxiety, negative fatigue, negative mania  Central nervous system:  Cranial nerves II through XII intact, tongue/uvula midline, all extremities muscle strength 5/5, sensation intact throughout, negative dysarthria, negative expressive aphasia, negative receptive aphasia.  .     Data Reviewed: Care during the described time interval was provided by me .  I have reviewed this patient's available data, including medical history, events of note, physical examination, and all test results as part of my evaluation.  CBC: Recent Labs  Lab 05/31/20 0736 05/31/20 1445 05/31/20 1750 06/01/20 0450  WBC 6.6  --   --  5.1  NEUTROABS 2.9  --   --   --   HGB 13.0 12.2 13.0 12.3  HCT 40.9 37.8 38.9 37.8  MCV 96.9  --   --  96.4  PLT 209  --   --  161   Basic Metabolic Panel: Recent Labs  Lab 05/31/20 0736 06/01/20 0450  NA 139 140  K 4.3 3.0*   CL 101 101  CO2 28 29  GLUCOSE 185* 159*  BUN 20 12  CREATININE 1.29* 1.15*  CALCIUM 9.3 8.9   GFR: CrCl cannot be calculated (Unknown ideal weight.). Liver Function Tests: Recent Labs  Lab 05/31/20 0736  AST 35  ALT 18  ALKPHOS 60  BILITOT 1.6*  PROT 7.0  ALBUMIN 3.9   No results for input(s): LIPASE, AMYLASE in the last 168 hours. No results for input(s): AMMONIA in the last 168 hours. Coagulation Profile: No results for input(s): INR, PROTIME in the last 168 hours. Cardiac Enzymes: No results for input(s): CKTOTAL, CKMB, CKMBINDEX, TROPONINI in the last 168 hours. BNP (last 3 results) No results for input(s): PROBNP in the last 8760 hours. HbA1C: No results for input(s): HGBA1C in the last 72 hours. CBG: Recent Labs  Lab 05/31/20 1557 05/31/20 2126 06/01/20 0625 06/01/20 1135  GLUCAP 148* 191* 166* 149*   Lipid Profile: No results for input(s): CHOL, HDL, LDLCALC, TRIG, CHOLHDL, LDLDIRECT in the last 72 hours.  Thyroid Function Tests: No results for input(s): TSH, T4TOTAL, FREET4, T3FREE, THYROIDAB in the last 72 hours. Anemia Panel: No results for input(s): VITAMINB12, FOLATE, FERRITIN, TIBC, IRON, RETICCTPCT in the last 72 hours. Sepsis Labs: No results for input(s): PROCALCITON, LATICACIDVEN in the last 168 hours.  Recent Results (from the past 240 hour(s))  SARS Coronavirus 2 by RT PCR (hospital order, performed in [facility] hospital lab) Nasopharyngeal Nasopharyngeal Swab     Status: None   Collection Time: 05/31/20 10:20 AM   Specimen: Nasopharyngeal Swab  Result Value Ref Range Status   SARS Coronavirus 2 NEGATIVE NEGATIVE Final    Comment: (NOTE) SARS-CoV-2 target nucleic acids are NOT DETECTED.  The SARS-CoV-2 RNA is generally detectable in upper and lower respiratory specimens during the acute phase of infection. The lowest concentration of SARS-CoV-2 viral copies this assay can detect is 250 copies / mL. A negative result does not preclude  SARS-CoV-2 infection and should not be used as the sole basis for treatment or other patient management decisions.  A negative result may occur with improper specimen collection / handling, submission of specimen other than nasopharyngeal swab, presence of viral mutation(s) within the areas targeted by this assay, and inadequate number of viral copies (<250 copies / mL). A negative result must be combined with clinical observations, patient history, and epidemiological information.  Fact Sheet for Patients:   StrictlyIdeas.no  Fact Sheet for Healthcare Providers: BankingDealers.co.za  This test is not yet approved or  cleared by the [place] FDA and has been authorized for detection and/or diagnosis of SARS-CoV-2 by FDA under an Emergency Use Authorization (EUA).  This EUA will remain in effect (meaning this test can be used) for the duration of the COVID-19 declaration under Section 564(b)(1) of the Act, 21 U.S.C. section 360bbb-3(b)(1), unless the authorization is terminated or revoked sooner.  Performed at [facility], [place] [address], [place], [place] [address]          Radiology Studies: CT ABDOMEN PELVIS W CONTRAST  Result Date: 05/31/2020 CLINICAL DATA:  Lower abdominal pain. EXAM: CT ABDOMEN AND PELVIS WITH CONTRAST TECHNIQUE: Multidetector CT imaging of the abdomen and pelvis was performed using the standard protocol following bolus administration of intravenous contrast. CONTRAST:  70 mL OMNIPAQUE IOHEXOL 300 MG/ML  SOLN COMPARISON:  December 09, 2019. FINDINGS: Lower chest: No acute abnormality. Hepatobiliary: No focal liver abnormality is seen. Status post cholecystectomy. No biliary dilatation. Pancreas: Unremarkable. No pancreatic ductal dilatation or surrounding inflammatory changes. Spleen: Normal in size without focal abnormality. Adrenals/Urinary Tract: Status post right nephrectomy. Right adrenal gland is not  noted. Left adrenal gland and kidney are unremarkable. No hydronephrosis or renal obstruction is noted. No renal or ureteral calculi are noted. Urinary bladder is unremarkable. Stomach/Bowel: Stomach is within normal limits. Appendix appears normal. No evidence of bowel wall thickening, distention, or inflammatory changes. Sigmoid diverticulosis is noted without inflammation. Vascular/Lymphatic: Aortic atherosclerosis. No enlarged abdominal or pelvic lymph nodes. Reproductive: Status post hysterectomy. No adnexal masses. Other: Small fat containing periumbilical hernia is noted. No ascites is noted. Musculoskeletal: No acute or significant osseous findings. IMPRESSION: 1. Small fat containing periumbilical hernia. 2. Sigmoid diverticulosis without inflammation. 3. Status post right nephrectomy. 4. No acute abnormality seen in the abdomen or pelvis. Aortic Atherosclerosis (ICD10-I70.0). Electronically Signed   By: Marijo Conception M.D.   On: 05/31/2020 09:17        Scheduled Meds: . amLODipine  10 mg Oral Daily  . clonazePAM  0.5 mg Oral Q breakfast   And  . clonazePAM  1 mg Oral QHS  . furosemide  40 mg Oral BID  . insulin aspart  0-9 Units Subcutaneous TID WC  . levothyroxine  88 mcg Oral QAC breakfast  . metoprolol tartrate  100 mg Oral BID  . pantoprazole  20 mg Oral Daily  . sodium chloride flush  3 mL Intravenous Q12H   Continuous Infusions:   LOS: 0 days    Time spent:40 min    Faryal Marxen, Geraldo Docker, MD [facility] Pager [phone]  If 7PM-7AM, please contact night-coverage [email or web address] Password [facility] 06/01/2020, 2:32 PM

## 2020-06-01 NOTE — Progress Notes (Signed)
Progress Note    ASSESSMENT AND PLAN:   Hematochezia, likely d/t diverticular or hemorrhoidal bleed. Hb stable. No further bleeding. Neg CT AP with contrast today. Neg colon 06/2019 except for small polyps. Chronic abdominal pain with chronic constipation- neg CT, neg EGD/colon CKD3 with H/O right nephrectomy (d/t RCC) Associated comorbid conditions- HTN, HLD, dCHF, DM2.  Plan: -MiraLAX 17 g p.o. once a day to continue. -Add Benefiber 1 tablespoon p.o. every afternoon with 8 ounces of water. -Can continue Protonix 20 mg p.o. once a day -FU with Dr. Earlean Shawl (patient knows him very well over the years) -Okay to discharge home from GI standpoint      SUBJECTIVE   No further bleeding. No further bowel movements.  Has been taking MiraLAX every day Denies having any abdominal pain.    OBJECTIVE:     Vital signs in last 24 hours: Temp:  [97.9 F (36.6 C)-98.4 F (36.9 C)] 98 F (36.7 C) (07/07 0458) Pulse Rate:  [58-70] 70 (07/07 0813) Resp:  [15-19] 19 (07/07 0458) BP: (110-149)/(71-80) 123/76 (07/07 0813) SpO2:  [95 %-99 %] 97 % (07/07 0458)   General:   Alert, well-developed female in NAD EENT:  Normal hearing, non icteric sclera, conjunctive pink.  Heart:  Regular rate and rhythm; no murmur.  No lower extremity edema   Pulm: Normal respiratory effort, lungs CTA bilaterally without wheezes or crackles. Abdomen:  Soft, nondistended, nontender.  Normal bowel sounds,.       Neurologic:  Alert and  oriented x4;  grossly normal neurologically. Psych:  Pleasant, cooperative.  Normal mood and affect.   Intake/Output from previous day: 07/06 0701 - 07/07 0700 In: 1240.5 [P.O.:240; IV Piggyback:1000.5] Out: -  Intake/Output this shift: No intake/output data recorded.  Lab Results: Recent Labs    05/31/20 0736 05/31/20 0736 05/31/20 1445 05/31/20 1750 06/01/20 0450  WBC 6.6  --   --   --  5.1  HGB 13.0   < > 12.2 13.0 12.3  HCT 40.9   < > 37.8 38.9 37.8   PLT 209  --   --   --  189   < > = values in this interval not displayed.   BMET Recent Labs    05/31/20 0736 06/01/20 0450  NA 139 140  K 4.3 3.0*  CL 101 101  CO2 28 29  GLUCOSE 185* 159*  BUN 20 12  CREATININE 1.29* 1.15*  CALCIUM 9.3 8.9   LFT Recent Labs    05/31/20 0736  PROT 7.0  ALBUMIN 3.9  AST 35  ALT 18  ALKPHOS 60  BILITOT 1.6*   PT/INR No results for input(s): LABPROT, INR in the last 72 hours. Hepatitis Panel No results for input(s): HEPBSAG, HCVAB, HEPAIGM, HEPBIGM in the last 72 hours.  CT ABDOMEN PELVIS W CONTRAST  Result Date: 05/31/2020 CLINICAL DATA:  Lower abdominal pain. EXAM: CT ABDOMEN AND PELVIS WITH CONTRAST TECHNIQUE: Multidetector CT imaging of the abdomen and pelvis was performed using the standard protocol following bolus administration of intravenous contrast. CONTRAST:  70 mL OMNIPAQUE IOHEXOL 300 MG/ML  SOLN COMPARISON:  December 09, 2019. FINDINGS: Lower chest: No acute abnormality. Hepatobiliary: No focal liver abnormality is seen. Status post cholecystectomy. No biliary dilatation. Pancreas: Unremarkable. No pancreatic ductal dilatation or surrounding inflammatory changes. Spleen: Normal in size without focal abnormality. Adrenals/Urinary Tract: Status post right nephrectomy. Right adrenal gland is not noted. Left adrenal gland and kidney are unremarkable. No hydronephrosis or renal obstruction is  noted. No renal or ureteral calculi are noted. Urinary bladder is unremarkable. Stomach/Bowel: Stomach is within normal limits. Appendix appears normal. No evidence of bowel wall thickening, distention, or inflammatory changes. Sigmoid diverticulosis is noted without inflammation. Vascular/Lymphatic: Aortic atherosclerosis. No enlarged abdominal or pelvic lymph nodes. Reproductive: Status post hysterectomy. No adnexal masses. Other: Small fat containing periumbilical hernia is noted. No ascites is noted. Musculoskeletal: No acute or significant osseous  findings. IMPRESSION: 1. Small fat containing periumbilical hernia. 2. Sigmoid diverticulosis without inflammation. 3. Status post right nephrectomy. 4. No acute abnormality seen in the abdomen or pelvis. Aortic Atherosclerosis (ICD10-I70.0). Electronically Signed   By: Marijo Conception M.D.   On: 05/31/2020 09:17     Principal Problem:   Lower GI bleed Active Problems:   Essential hypertension   Hypothyroidism   CKD (chronic kidney disease), stage III   Type 2 diabetes mellitus with diabetic polyneuropathy, without long-term current use of insulin ([place])     LOS: 0 days     Carmell Austria, MD 06/01/2020, 9:56 AM [facility] [phone]

## 2020-06-01 NOTE — Plan of Care (Signed)
  Problem: Education: Goal: Ability to identify signs and symptoms of gastrointestinal bleeding will improve Outcome: Progressing   Problem: Clinical Measurements: Goal: Will remain free from infection Outcome: Progressing   Problem: Clinical Measurements: Goal: Will remain free from infection Outcome: Progressing   Problem: Activity: Goal: Risk for activity intolerance will decrease Outcome: Progressing

## 2020-06-02 DIAGNOSIS — K922 Gastrointestinal hemorrhage, unspecified: Secondary | ICD-10-CM | POA: Diagnosis not present

## 2020-06-02 DIAGNOSIS — E1142 Type 2 diabetes mellitus with diabetic polyneuropathy: Secondary | ICD-10-CM | POA: Diagnosis not present

## 2020-06-02 LAB — CBC WITH DIFFERENTIAL/PLATELET
Abs Immature Granulocytes: 0.03 10*3/uL (ref 0.00–0.07)
Basophils Absolute: 0 10*3/uL (ref 0.0–0.1)
Basophils Relative: 1 %
Eosinophils Absolute: 0.1 10*3/uL (ref 0.0–0.5)
Eosinophils Relative: 2 %
HCT: 37.9 % (ref 36.0–46.0)
Hemoglobin: 12.5 g/dL (ref 12.0–15.0)
Immature Granulocytes: 1 %
Lymphocytes Relative: 43 %
Lymphs Abs: 2.8 10*3/uL (ref 0.7–4.0)
MCH: 31.7 pg (ref 26.0–34.0)
MCHC: 33 g/dL (ref 30.0–36.0)
MCV: 96.2 fL (ref 80.0–100.0)
Monocytes Absolute: 0.5 10*3/uL (ref 0.1–1.0)
Monocytes Relative: 9 %
Neutro Abs: 2.8 10*3/uL (ref 1.7–7.7)
Neutrophils Relative %: 44 %
Platelets: 180 10*3/uL (ref 150–400)
RBC: 3.94 MIL/uL (ref 3.87–5.11)
RDW: 13.6 % (ref 11.5–15.5)
WBC: 6.3 10*3/uL (ref 4.0–10.5)
nRBC: 0 % (ref 0.0–0.2)

## 2020-06-02 LAB — COMPREHENSIVE METABOLIC PANEL
ALT: 34 U/L (ref 0–44)
AST: 26 U/L (ref 15–41)
Albumin: 3.4 g/dL — ABNORMAL LOW (ref 3.5–5.0)
Alkaline Phosphatase: 81 U/L (ref 38–126)
Anion gap: 9 (ref 5–15)
BUN: 14 mg/dL (ref 8–23)
CO2: 29 mmol/L (ref 22–32)
Calcium: 9.6 mg/dL (ref 8.9–10.3)
Chloride: 102 mmol/L (ref 98–111)
Creatinine, Ser: 1.36 mg/dL — ABNORMAL HIGH (ref 0.44–1.00)
GFR calc Af Amer: 42 mL/min — ABNORMAL LOW (ref >60)
GFR calc non Af Amer: 36 mL/min — ABNORMAL LOW (ref >60)
Glucose, Bld: 183 mg/dL — ABNORMAL HIGH (ref 70–99)
Potassium: 4.1 mmol/L (ref 3.5–5.1)
Sodium: 140 mmol/L (ref 135–145)
Total Bilirubin: 1.1 mg/dL (ref 0.3–1.2)
Total Protein: 6.3 g/dL — ABNORMAL LOW (ref 6.5–8.1)

## 2020-06-02 LAB — PHOSPHORUS: Phosphorus: 2.7 mg/dL (ref 2.5–4.6)

## 2020-06-02 LAB — MAGNESIUM: Magnesium: 1.8 mg/dL (ref 1.7–2.4)

## 2020-06-02 LAB — GLUCOSE, CAPILLARY: Glucose-Capillary: 165 mg/dL — ABNORMAL HIGH (ref 70–99)

## 2020-06-02 MED ORDER — POLYETHYLENE GLYCOL 3350 17 G PO PACK: 17.0000 g | PACK | Freq: Every day | ORAL | 0 refills | Status: DC | PRN

## 2020-06-02 MED ORDER — METOPROLOL TARTRATE 100 MG PO TABS: 100.0000 mg | ORAL_TABLET | Freq: Two times a day (BID) | ORAL | 0 refills | Status: DC

## 2020-06-02 MED ORDER — ONDANSETRON HCL 4 MG PO TABS: 4.0000 mg | ORAL_TABLET | Freq: Four times a day (QID) | ORAL | 0 refills | Status: DC | PRN

## 2020-06-02 MED ORDER — ACETAMINOPHEN 325 MG PO TABS: 650.0000 mg | ORAL_TABLET | Freq: Four times a day (QID) | ORAL | 0 refills | Status: DC | PRN

## 2020-06-02 MED ORDER — PANTOPRAZOLE SODIUM 20 MG PO TBEC: 20.0000 mg | DELAYED_RELEASE_TABLET | Freq: Every day | ORAL | 0 refills | Status: DC

## 2020-06-02 MED FILL — METOPROLOL TARTRATE 100 MG: 100 | 30 days supply | Qty: 60 | Fill #0

## 2020-06-02 MED FILL — PANTOPRAZOLE SOD DR 20 MG T: 20 | 30 days supply | Qty: 30 | Fill #0

## 2020-06-02 MED FILL — ONDANSETRON HCL 4 MG TABLET: 4 | 5 days supply | Qty: 20 | Fill #0

## 2020-06-02 MED FILL — POLYETHYLENE GLYCOL 3350 PO: 17 | 14 days supply | Qty: 238 | Fill #0

## 2020-06-02 MED FILL — ACETAMINOPHEN 325 MG TABS: 325 | 3 days supply | Qty: 30 | Fill #0

## 2020-06-02 NOTE — Discharge Summary (Signed)
Physician Discharge Summary  GRACE VALLEY EUM:[id number] DOB: 06-23-1936 DOA: 05/31/2020  PCP: Rutherford Guys, MD  Admit date: 05/31/2020 Discharge date: 06/02/2020  Time spent: 30 minutes  Recommendations for Outpatient Follow-up:   Acute lower GI bleed. -Last colonoscopy from 07/14/2019 with Dr. Tarri Glenn noted polyps which were removed and nonbleeding internal hemorrhoids. -Per GI most likely secondary to diverticular hemorrhoid bleed.  No procedure performed Recent Labs  Lab 05/31/20 0736 05/31/20 1445 05/31/20 1750 06/01/20 0450 06/02/20 0315  HGB 13.0 12.2 13.0 12.3 12.5  -Stable -Protonix 20 mg daily -MiraLAX PRN -Stable -Continue with carb modified diet   Chronic diastolic CHF -Strict in and out +1.2 L -Daily weight There were no vitals filed for this visit. -Amlodipine 10 mg daily -Lasix 40 mg BID -Metoprolol 100 mg BID  Essential HTN -See CHF  CKD stage III (baseline Cr 1.2 -1.3)  Recent Labs  Lab 05/31/20 0736 06/01/20 0450 06/02/20 0315  CREATININE 1.29* 1.15* 1.36*  -Baseline  Hypothyroidism -Levothyroxine 88 mcg daily   DM type II uncontrolled with complication -5/40 hemoglobin A1c= 8.5 -: Home medications include Januvia 50 mg daily. Last hemoglobin A1c was 8.5 on 05/09/2020. -We will discharge patient on her home diabetic medication even though she was not taking. -Glipizide 20 mg BID -Januvia 50 mg daily -Schedule follow-up appointment with Dr. Jaquita Folds in 2 weeks, uncontrolled diabetes CKD stage III, chronic diastolic CHF.  Anxiety -Continue Klonopin as needed  Hyperbilirubinemia: Acute.  -Total bilirubin mildly elevated at 1.6. -Return to normal  GERD: -Continue home regimen  Hypokalemia -Resolved     Discharge Diagnoses:  Principal Problem:   Lower GI bleed Active Problems:   Essential hypertension   Hypothyroidism   CKD (chronic kidney disease), stage III   Type 2 diabetes mellitus with diabetic  polyneuropathy, without long-term current use of insulin ([place])   Discharge Condition: Stable  Diet recommendation: Carb modified  There were no vitals filed for this visit.  History of present illness:  84 y.o.BF PMHx HTN, HLD, chronic diastolic CHF, ischemic colitis, Hypothyroidism, CKD stage III, Renal cell carcinoma s/p nephrectomy.   Presents with complaints of blood in her stools that started yesterday evening. Patient reported blood was bright red in color and also a significant amount. Then this morning she had a second bowel movement possible clots. She reports having had 6 other episodes of diverticular bleeding in the past. Noted associated symptoms of some abdominal bloating, lightheadedness, and anxiety. She reports that she has had 6 prior episodes of blood in her stools that were related to history of diverticulosis for which she follows with Dr. Tarri Glenn of gastroenterology in outpatient setting. Her last colonoscopy and endoscopy was on 07/14/2019 with Dr. Tarri Glenn noted gastric and colon polyps which were removed and nonbleeding internal hemorrhoids.  ED Course:Upon admission into the emergency department patient was noted to be afebrile with relatively stable vital signs. Labs revealed hemoglobin 13, BUN 20, creatinine 1.29, glucose 185, and total bilirubin 1.6. Stool guaiacs were noted to be positive. [facility] was formally consulted. Patient had been given tonics 40 mg IV x1 dose, morphine, and 1 L normal saline IV fluids. TRH called to admit.  Hospital Course:  See above    Discharge Exam: Vitals:   06/01/20 1838 06/02/20 0038 06/02/20 0600 06/02/20 0810  BP: 115/72 137/86 130/81 129/71  Pulse: 73 70 66 70  Resp: 18 16 16    Temp: 98.6 F (37 C) 98 F (36.7 C) 98 F (36.7  C)   TempSrc: Oral Oral Oral   SpO2:  95% 95%     General: A/O x4, no acute respiratory distress Eyes: negative scleral hemorrhage, negative anisocoria, negative icterus ENT:  Negative Runny nose, negative gingival bleeding, Neck:  Negative scars, masses, torticollis, lymphadenopathy, JVD Lungs: Clear to auscultation bilaterally without wheezes or crackles Cardiovascular: Regular rate and rhythm without murmur gallop or rub normal S1 and S2   Discharge Instructions   Allergies as of 06/02/2020      Reactions   Ace Inhibitors Swelling   See 02/16/19    Codeine Other (See Comments)   hallucinations   Metformin And Related    Upset stomach   Ciprocin-fluocin-procin [fluocinolone Acetonide] Other (See Comments)   Unknown reaction   Clonidine Derivatives Other (See Comments)   Dry mouth   Crestor [rosuvastatin Calcium] Other (See Comments)   cramps   Penicillins Other (See Comments)   Has patient had a PCN reaction causing immediate rash, facial/tongue/throat swelling, SOB or lightheadedness with hypotension: no Has patient had a PCN reaction causing severe rash involving mucus membranes or skin necrosis: no Has patient had a PCN reaction that required hospitalization : no Has patient had a PCN reaction occurring within the last 10 years: no -Hallucinates If all of the above answers are "NO", then may proceed with Cephalosporin use.   Simvastatin Other (See Comments)   Upset stomach   Tessalon Perles Other (See Comments)   Gi upset      Medication List    STOP taking these medications   ALIGN PREBIOTIC-PROBIOTIC PO   hydrocortisone 25 MG suppository Commonly known as: ANUSOL-HC   hydroxypropyl methylcellulose / hypromellose 2.5 % ophthalmic solution Commonly known as: ISOPTO TEARS / GONIOVISC   ketorolac 0.5 % ophthalmic solution Commonly known as: ACULAR   ofloxacin 0.3 % ophthalmic solution Commonly known as: OCUFLOX   omeprazole 40 MG capsule Commonly known as: PRILOSEC Replaced by: pantoprazole 20 MG tablet     TAKE these medications   acetaminophen 325 MG tablet Commonly known as: TYLENOL Take 2 tablets (650 mg total) by mouth  every 6 (six) hours as needed for mild pain (or Fever >/= 101). What changed:   medication strength  how much to take  reasons to take this   acetic acid 2 % otic solution Place 4 drops into both ears 3 (three) times daily.   amLODipine 10 MG tablet Commonly known as: NORVASC TAKE 1 TABLET (10 MG TOTAL) BY MOUTH DAILY.   brimonidine 0.2 % ophthalmic solution Commonly known as: ALPHAGAN Place 1 drop into the left eye 2 (two) times daily.   cholecalciferol 1000 units tablet Commonly known as: VITAMIN D Take 1,000 Units by mouth daily.   clonazePAM 1 MG tablet Commonly known as: KLONOPIN TAKE HALF TABLET BY MOUTH EVERY MORNING AND TAKE ONE TABLET BY MOUTH AT BEDTIME What changed:   how much to take  how to take this  when to take this  additional instructions   Cyclogyl 2 % ophthalmic solution Generic drug: cyclopentolate Place 1 drop into the left eye 3 (three) times daily.   diclofenac Sodium 1 % Gel Commonly known as: VOLTAREN APPLY 4 G TOPICALLY 4 (FOUR) TIMES DAILY. What changed:   when to take this  reasons to take this   DRY EYE RELIEF DROPS OP Place 1 drop into the left eye daily as needed (dry eyes).   famotidine 20 MG tablet Commonly known as: PEPCID Take 1 tablet (20 mg total)  by mouth at bedtime.   fluticasone 50 MCG/ACT nasal spray Commonly known as: FLONASE PLACE 1-2 SPRAYS INTO BOTH NOSTRILS AT BEDTIME. What changed:   when to take this  reasons to take this   furosemide 40 MG tablet Commonly known as: LASIX Take 1 tablet (40 mg total) by mouth 2 (two) times daily.   glipiZIDE 10 MG tablet Commonly known as: GLUCOTROL TAKE TWO TABLETS BY MOUTH TWICE A DAY   levothyroxine 88 MCG tablet Commonly known as: SYNTHROID TAKE ONE TABLET BY MOUTH DAILY BEFORE BREAKFAST What changed: See the new instructions.   metoprolol tartrate 100 MG tablet Commonly known as: LOPRESSOR Take 1 tablet (100 mg total) by mouth 2 (two) times daily.    ondansetron 4 MG tablet Commonly known as: ZOFRAN Take 1 tablet (4 mg total) by mouth every 6 (six) hours as needed for nausea.   pantoprazole 20 MG tablet Commonly known as: PROTONIX Take 1 tablet (20 mg total) by mouth daily. Start taking on: June 03, 2020 Replaces: omeprazole 40 MG capsule   polyethylene glycol 17 g packet Commonly known as: MIRALAX / GLYCOLAX Take 17 g by mouth daily as needed for mild constipation.   sitaGLIPtin 50 MG tablet Commonly known as: Januvia Take 1 tablet (50 mg total) by mouth daily. What changed: when to take this   vitamin E 45 MG (100 UNITS) capsule Take 100 Units by mouth daily.      Allergies  Allergen Reactions  . Ace Inhibitors Swelling    See 02/16/19   . Codeine Other (See Comments)    hallucinations  . Metformin And Related     Upset stomach  . Ciprocin-Fluocin-Procin [Fluocinolone Acetonide] Other (See Comments)    Unknown reaction  . Clonidine Derivatives Other (See Comments)    Dry mouth  . Crestor [Rosuvastatin Calcium] Other (See Comments)    cramps  . Penicillins Other (See Comments)    Has patient had a PCN reaction causing immediate rash, facial/tongue/throat swelling, SOB or lightheadedness with hypotension: no Has patient had a PCN reaction causing severe rash involving mucus membranes or skin necrosis: no Has patient had a PCN reaction that required hospitalization : no Has patient had a PCN reaction occurring within the last 10 years: no -Hallucinates If all of the above answers are "NO", then may proceed with Cephalosporin use.   . Simvastatin Other (See Comments)    Upset stomach   . Tessalon Perles Other (See Comments)    Gi upset       The results of significant diagnostics from this hospitalization (including imaging, microbiology, ancillary and laboratory) are listed below for reference.    Significant Diagnostic Studies: CT ABDOMEN PELVIS W CONTRAST  Result Date: 05/31/2020 CLINICAL DATA:  Lower  abdominal pain. EXAM: CT ABDOMEN AND PELVIS WITH CONTRAST TECHNIQUE: Multidetector CT imaging of the abdomen and pelvis was performed using the standard protocol following bolus administration of intravenous contrast. CONTRAST:  70 mL OMNIPAQUE IOHEXOL 300 MG/ML  SOLN COMPARISON:  December 09, 2019. FINDINGS: Lower chest: No acute abnormality. Hepatobiliary: No focal liver abnormality is seen. Status post cholecystectomy. No biliary dilatation. Pancreas: Unremarkable. No pancreatic ductal dilatation or surrounding inflammatory changes. Spleen: Normal in size without focal abnormality. Adrenals/Urinary Tract: Status post right nephrectomy. Right adrenal gland is not noted. Left adrenal gland and kidney are unremarkable. No hydronephrosis or renal obstruction is noted. No renal or ureteral calculi are noted. Urinary bladder is unremarkable. Stomach/Bowel: Stomach is within normal limits. Appendix appears normal.  No evidence of bowel wall thickening, distention, or inflammatory changes. Sigmoid diverticulosis is noted without inflammation. Vascular/Lymphatic: Aortic atherosclerosis. No enlarged abdominal or pelvic lymph nodes. Reproductive: Status post hysterectomy. No adnexal masses. Other: Small fat containing periumbilical hernia is noted. No ascites is noted. Musculoskeletal: No acute or significant osseous findings. IMPRESSION: 1. Small fat containing periumbilical hernia. 2. Sigmoid diverticulosis without inflammation. 3. Status post right nephrectomy. 4. No acute abnormality seen in the abdomen or pelvis. Aortic Atherosclerosis (ICD10-I70.0). Electronically Signed   By: Marijo Conception M.D.   On: 05/31/2020 09:17   DG SWALLOW FUNC OP MEDICARE SPEECH PATH  Result Date: 05/26/2020 Objective Swallowing Evaluation: Type of Study: MBS-Modified Barium Swallow Study  Patient Details Name: KENDLE TURBIN MRN: [id number] Date of Birth: 1936-02-19 Today's Date: 05/26/2020 Time: SLP Start Time (ACUTE ONLY): 1137 -SLP Stop  Time (ACUTE ONLY): 1201 SLP Time Calculation (min) (ACUTE ONLY): 24 min Past Medical History: Past Medical History: Diagnosis Date . Abnormal EKG 02/07/2016  Inferolateral T wave inversion and ST depression. . Acute calculous cholecystitis  . Anxiety  . Arthritis  . Chronic diastolic CHF (congestive heart failure) ([place]) 02/21/2018 . Colon polyps 11/2008  tubular adenoma . Diabetes mellitus  . Esophageal problem   esophageal dilation . GERD (gastroesophageal reflux disease)   + hpylori . GI bleeding 01/2018 . Headache  . Hyperlipidemia  . Hypertension  . Hypothyroidism  . Ischemic colitis ([place])  . Renal cancer, right ([place]) 2010  s/p Rt nephrectomy by Dr. Rosana Hoes . Renal insufficiency  Past Surgical History: Past Surgical History: Procedure Laterality Date . ABDOMINAL HYSTERECTOMY  1978  partial . CATARACT EXTRACTION   . CHOLECYSTECTOMY N/A 04/04/2016  Procedure: LAPAROSCOPIC CHOLECYSTECTOMY;  Surgeon: Autumn Messing III, MD;  Location: [facility];  Service: General;  Laterality: N/A; . COLONOSCOPY   . ESOPHAGEAL DILATION  2016 . HERNIA REPAIR   . LAPAROSCOPIC CHOLECYSTECTOMY  04/04/2016 . NEPHRECTOMY Right 2010  Dr. Rosana Hoes, urology, due to renal cancer HPI: 84 yr old seen for outpatient MBS with complaints of pharyngeal globus sensation, coughing/throat clearing during meals, and difficulty masticating due to endentulous state. PMH: GERD, esophageal dilation 5 years ago, COPD, kidney cancer, stroke, pneumonia. She showed therapist a swollen area near her right collarbone.  No data recorded Assessment / Plan / Recommendation CHL IP CLINICAL IMPRESSIONS 05/26/2020 Clinical Impression Pt exhibited normal oropharyngeal swallow function with esophageal involvement in pt with known GERD. There was age appropriate and functional flash laryngeal penetration with thin that completely cleared during swallows. No difficulty propelling barium pill with thin nor mastication of cracker despite endentulous state. Esophageal scan revealed hesitation  of pill into GE junction that entered into stomach given sips of thin and additional time. Suspect her symptoms are likely related to her previously diagnosed GER. If symptoms worsen further work up of esophagus could be beneficial as she does have history of esophageal dilation. Recommend she continue regular texture, thin liquids and strategies for GERD which were reviewed with pt verbally and handout given  SLP Visit Diagnosis Dysphagia, unspecified (R13.10) Attention and concentration deficit following -- Frontal lobe and executive function deficit following -- Impact on safety and function Mild aspiration risk   CHL IP TREATMENT RECOMMENDATION 05/26/2020 Treatment Recommendations No treatment recommended at this time   No flowsheet data found. CHL IP DIET RECOMMENDATION 05/26/2020 SLP Diet Recommendations Regular solids;Thin liquid Liquid Administration via Cup;Straw Medication Administration Whole meds with liquid Compensations Follow solids with liquid;Small sips/bites Postural Changes Remain semi-upright after after  feeds/meals (Comment);Seated upright at 90 degrees   CHL IP OTHER RECOMMENDATIONS 05/26/2020 Recommended Consults -- Oral Care Recommendations Oral care BID Other Recommendations --   CHL IP FOLLOW UP RECOMMENDATIONS 05/26/2020 Follow up Recommendations None   No flowsheet data found.     CHL IP ORAL PHASE 05/26/2020 Oral Phase WFL Oral - Pudding Teaspoon -- Oral - Pudding Cup -- Oral - Honey Teaspoon -- Oral - Honey Cup -- Oral - Nectar Teaspoon -- Oral - Nectar Cup -- Oral - Nectar Straw -- Oral - Thin Teaspoon -- Oral - Thin Cup -- Oral - Thin Straw -- Oral - Puree -- Oral - Mech Soft -- Oral - Regular -- Oral - Multi-Consistency -- Oral - Pill -- Oral Phase - Comment --  CHL IP PHARYNGEAL PHASE 05/26/2020 Pharyngeal Phase WFL Pharyngeal- Pudding Teaspoon -- Pharyngeal -- Pharyngeal- Pudding Cup -- Pharyngeal -- Pharyngeal- Honey Teaspoon -- Pharyngeal -- Pharyngeal- Honey Cup -- Pharyngeal -- Pharyngeal-  Nectar Teaspoon -- Pharyngeal -- Pharyngeal- Nectar Cup -- Pharyngeal -- Pharyngeal- Nectar Straw -- Pharyngeal -- Pharyngeal- Thin Teaspoon -- Pharyngeal -- Pharyngeal- Thin Cup -- Pharyngeal -- Pharyngeal- Thin Straw -- Pharyngeal -- Pharyngeal- Puree -- Pharyngeal -- Pharyngeal- Mechanical Soft -- Pharyngeal -- Pharyngeal- Regular -- Pharyngeal -- Pharyngeal- Multi-consistency -- Pharyngeal -- Pharyngeal- Pill -- Pharyngeal -- Pharyngeal Comment --  CHL IP CERVICAL ESOPHAGEAL PHASE 05/26/2020 Cervical Esophageal Phase WFL Pudding Teaspoon -- Pudding Cup -- Honey Teaspoon -- Honey Cup -- Nectar Teaspoon -- Nectar Cup -- Nectar Straw -- Thin Teaspoon -- Thin Cup -- Thin Straw -- Puree -- Mechanical Soft -- Regular -- Multi-consistency -- Pill -- Cervical Esophageal Comment -- [place] Siren 05/26/2020, 3:52 PM Orbie Pyo Litaker M.Ed Risk analyst [phone] Office [phone]            CLINICAL DATA:  Dysphagia EXAM: MODIFIED BARIUM SWALLOW TECHNIQUE: Different consistencies of barium were administered orally to the patient by the Speech Pathologist. Imaging of the pharynx was performed in the lateral projection. The radiologist was present in the fluoroscopy room for this study, providing personal supervision. FLUOROSCOPY TIME:  Fluoroscopy Time:  59 seconds Radiation Exposure Index (if provided by the fluoroscopic device): Not applicable Number of Acquired Spot Images: 0 COMPARISON:  Soft tissue neck x-ray from 2020 FINDINGS: Lateral view of the neck shows extensive degenerative changes throughout the cervical spine. Prevertebral soft tissues grossly normal. Swallowing was performed across varying consistencies. Thin barium and Phillip Heal cracker with barium were utilized for this evaluation. Flash penetration was demonstrated with thin barium without signs of aspiration. Normal swallow mechanism was demonstrated. The patient is edentulous. Mild impression from osteophytes along the  posterior esophagus. The patient was then asked to swallow a barium tablet. This halted transiently in the distal esophagus but without intrinsic abnormality seen on limited assessment and without symptoms of globus, after swallowing of a small amount of barium laden applesauce it passed easily into the stomach. IMPRESSION: Transient penetration with thin liquids, no aspiration. Swallowing a barium tablet without difficulty and without symptoms of globus sensation as described. If symptoms continue esophageal evaluation could be performed. Please refer to the Speech Pathologists report for complete details and recommendations. Electronically Signed   By: Zetta Bills M.D.   On: 05/26/2020 14:18    Microbiology: Recent Results (from the past 240 hour(s))  SARS Coronavirus 2 by RT PCR (hospital order, performed in [facility] hospital lab) Nasopharyngeal Nasopharyngeal Swab     Status: None   Collection  Time: 05/31/20 10:20 AM   Specimen: Nasopharyngeal Swab  Result Value Ref Range Status   SARS Coronavirus 2 NEGATIVE NEGATIVE Final    Comment: (NOTE) SARS-CoV-2 target nucleic acids are NOT DETECTED.  The SARS-CoV-2 RNA is generally detectable in upper and lower respiratory specimens during the acute phase of infection. The lowest concentration of SARS-CoV-2 viral copies this assay can detect is 250 copies / mL. A negative result does not preclude SARS-CoV-2 infection and should not be used as the sole basis for treatment or other patient management decisions.  A negative result may occur with improper specimen collection / handling, submission of specimen other than nasopharyngeal swab, presence of viral mutation(s) within the areas targeted by this assay, and inadequate number of viral copies (<250 copies / mL). A negative result must be combined with clinical observations, patient history, and epidemiological information.  Fact Sheet for Patients:    StrictlyIdeas.no  Fact Sheet for Healthcare Providers: BankingDealers.co.za  This test is not yet approved or  cleared by the [place] FDA and has been authorized for detection and/or diagnosis of SARS-CoV-2 by FDA under an Emergency Use Authorization (EUA).  This EUA will remain in effect (meaning this test can be used) for the duration of the COVID-19 declaration under Section 564(b)(1) of the Act, 21 U.S.C. section 360bbb-3(b)(1), unless the authorization is terminated or revoked sooner.  Performed at [facility], [place] [address], [place], [place] [address]      Labs: Basic Metabolic Panel: Recent Labs  Lab 05/31/20 0736 06/01/20 0450 06/02/20 0315  NA 139 140 140  K 4.3 3.0* 4.1  CL 101 101 102  CO2 28 29 29   GLUCOSE 185* 159* 183*  BUN 20 12 14   CREATININE 1.29* 1.15* 1.36*  CALCIUM 9.3 8.9 9.6  MG  --   --  1.8  PHOS  --   --  2.7   Liver Function Tests: Recent Labs  Lab 05/31/20 0736 06/02/20 0315  AST 35 26  ALT 18 34  ALKPHOS 60 81  BILITOT 1.6* 1.1  PROT 7.0 6.3*  ALBUMIN 3.9 3.4*   No results for input(s): LIPASE, AMYLASE in the last 168 hours. No results for input(s): AMMONIA in the last 168 hours. CBC: Recent Labs  Lab 05/31/20 0736 05/31/20 1445 05/31/20 1750 06/01/20 0450 06/02/20 0315  WBC 6.6  --   --  5.1 6.3  NEUTROABS 2.9  --   --   --  2.8  HGB 13.0 12.2 13.0 12.3 12.5  HCT 40.9 37.8 38.9 37.8 37.9  MCV 96.9  --   --  96.4 96.2  PLT 209  --   --  189 180   Cardiac Enzymes: No results for input(s): CKTOTAL, CKMB, CKMBINDEX, TROPONINI in the last 168 hours. BNP: BNP (last 3 results) No results for input(s): BNP in the last 8760 hours.  ProBNP (last 3 results) No results for input(s): PROBNP in the last 8760 hours.  CBG: Recent Labs  Lab 06/01/20 0625 06/01/20 1135 06/01/20 1603 06/01/20 2047 06/02/20 0602  GLUCAP 166* 149* 182* 160* 165*        Signed:  Dia Crawford, MD [facility] [phone] pager

## 2020-06-02 NOTE — Progress Notes (Signed)
RN gave pt discharge instructions and she stated understanding, new medications escribed to [facility] and they have dropped it off. IV has been removed and pt is dressed she is waiting for her daughter for pickup.

## 2020-06-13 ENCOUNTER — Ambulatory Visit: Payer: Medicare Other | Admitting: Podiatry

## 2020-06-16 ENCOUNTER — Encounter: Payer: Self-pay | Admitting: Family Medicine

## 2020-06-16 ENCOUNTER — Ambulatory Visit (INDEPENDENT_AMBULATORY_CARE_PROVIDER_SITE_OTHER): Payer: Medicare Other | Admitting: Family Medicine

## 2020-06-16 ENCOUNTER — Other Ambulatory Visit: Payer: Self-pay

## 2020-06-16 VITALS — BP 189/104 | HR 74 | Temp 97.7°F | Ht 66.0 in | Wt 176.0 lb

## 2020-06-16 DIAGNOSIS — E1142 Type 2 diabetes mellitus with diabetic polyneuropathy: Secondary | ICD-10-CM | POA: Diagnosis not present

## 2020-06-16 DIAGNOSIS — I5032 Chronic diastolic (congestive) heart failure: Secondary | ICD-10-CM

## 2020-06-16 DIAGNOSIS — I10 Essential (primary) hypertension: Secondary | ICD-10-CM

## 2020-06-16 DIAGNOSIS — K922 Gastrointestinal hemorrhage, unspecified: Secondary | ICD-10-CM | POA: Diagnosis not present

## 2020-06-16 MED ORDER — FUROSEMIDE 40 MG PO TABS: 40.0000 mg | ORAL_TABLET | Freq: Two times a day (BID) | ORAL | 0 refills | Status: DC

## 2020-06-16 MED ORDER — METOPROLOL TARTRATE 100 MG PO TABS: 100.0000 mg | ORAL_TABLET | Freq: Two times a day (BID) | ORAL | 1 refills | Status: AC

## 2020-06-16 MED ORDER — GLIPIZIDE 10 MG PO TABS
5.0000 mg | ORAL_TABLET | Freq: Every day | ORAL | 3 refills | Status: DC
Start: 2020-06-16 — End: 2021-04-18

## 2020-06-16 MED ORDER — PANTOPRAZOLE SODIUM 20 MG PO TBEC: 20.0000 mg | DELAYED_RELEASE_TABLET | Freq: Every day | ORAL | 1 refills | Status: DC

## 2020-06-16 NOTE — Patient Instructions (Addendum)
  Start glipizide again: 1/2 tab (5mg ) every morning Continue with Januvia 50mg  daily Restart amlodipine 10mg  daily   If you have lab work done today you will be contacted with your lab results within the next 2 weeks.  If you have not heard from [place] then please contact us. The fastest way to get your results is to register for My Chart.   IF you received an x-ray today, you will receive an invoice from [facility]. Please contact [facility] at [phone] with questions or concerns regarding your invoice.   IF you received labwork today, you will receive an invoice from [facility]. Please contact [facility] at [phone] with questions or concerns regarding your invoice.   Our billing staff will not be able to assist you with questions regarding bills from these companies.  You will be contacted with the lab results as soon as they are available. The fastest way to get your results is to activate your My Chart account. Instructions are located on the last page of this paperwork. If you have not heard from [place] regarding the results in 2 weeks, please contact this office.

## 2020-06-16 NOTE — Progress Notes (Signed)
7/22/20214:29 PM  Sherri Burns Jun 23, 1936, 84 y.o., female [id number]  Chief Complaint  Patient presents with  . Hospitalization Follow-up    lower GI bleed / patient states better no blood seen since hospital     HPI:   Patient is a 84 y.o. female with past medical history significant for DM2, CKD3, s/p nephrectomy, HTN, anxietyon long term bzd, hypothyroidism, HLPwith statin intolerance who presents today for hosp followup  Last OV June 2021 - barium swallow for dysphagia - normal  hosp July 6-8 for Lower GI bleed thought to be 2/2 diverticular bleed, no procedures done Has not had anymore blood in her stool Has not been taking amlodipine, glipizide, pepcid She has not taken lasix as she was coming to this appt  CBC Latest Ref Rng & Units 06/02/2020 06/01/2020 05/31/2020  WBC 4.0 - 10.5 K/uL 6.3 5.1 -  Hemoglobin 12.0 - 15.0 g/dL 12.5 12.3 13.0  Hematocrit 36 - 46 % 37.9 37.8 38.9  Platelets 150 - 400 K/uL 180 189 -   Lab Results  Component Value Date   CREATININE 1.36 (H) 06/02/2020   BUN 14 06/02/2020   NA 140 06/02/2020   K 4.1 06/02/2020   CL 102 06/02/2020   CO2 29 06/02/2020  GFR 42  Depression screen [facility] 2/9 05/09/2020 04/11/2020 03/24/2020  Decreased Interest 0 0 0  Down, Depressed, Hopeless 2 0 0  PHQ - 2 Score 2 0 0  Altered sleeping 2 - -  Tired, decreased energy 2 - -  Change in appetite 0 - -  Feeling bad or failure about yourself  0 - -  Trouble concentrating 0 - -  Moving slowly or fidgety/restless 0 - -  Suicidal thoughts - - -  PHQ-9 Score 6 - -  Difficult doing work/chores Not difficult at all - -  Some recent data might be hidden    Fall Risk  06/16/2020 05/09/2020 04/11/2020 03/24/2020 02/08/2020  Falls in the past year? 0 0 0 0 0  Number falls in past yr: 0 0 0 0 0  Injury with Fall? 0 0 0 0 0  Follow up Falls evaluation completed Falls evaluation completed - Falls evaluation completed Falls evaluation completed     Allergies    Allergen Reactions  . Ace Inhibitors Swelling    See 02/16/19   . Codeine Other (See Comments)    hallucinations  . Metformin And Related     Upset stomach  . Ciprocin-Fluocin-Procin [Fluocinolone Acetonide] Other (See Comments)    Unknown reaction  . Clonidine Derivatives Other (See Comments)    Dry mouth  . Crestor [Rosuvastatin Calcium] Other (See Comments)    cramps  . Penicillins Other (See Comments)    Has patient had a PCN reaction causing immediate rash, facial/tongue/throat swelling, SOB or lightheadedness with hypotension: no Has patient had a PCN reaction causing severe rash involving mucus membranes or skin necrosis: no Has patient had a PCN reaction that required hospitalization : no Has patient had a PCN reaction occurring within the last 10 years: no -Hallucinates If all of the above answers are "NO", then may proceed with Cephalosporin use.   . Simvastatin Other (See Comments)    Upset stomach   . Tessalon Perles Other (See Comments)    Gi upset     Prior to Admission medications   Medication Sig Start Date End Date Taking? Authorizing Provider  acetaminophen (TYLENOL) 325 MG tablet Take 2 tablets (650 mg total) by mouth every  6 (six) hours as needed for mild pain (or Fever >/= 101). 06/02/20  Yes Allie Bossier, MD  cholecalciferol (VITAMIN D) 1000 units tablet Take 1,000 Units by mouth daily.   Yes [provider]  clonazePAM (KLONOPIN) 1 MG tablet TAKE HALF TABLET BY MOUTH EVERY MORNING AND TAKE ONE TABLET BY MOUTH AT BEDTIME Patient taking differently: Take 0.5-1 mg by mouth See admin instructions. Take 1/2 tablet (0.5mg ) by mouth daily in the morning and take 1 tablet (1mg ) by mouth daily at bedtime. 05/09/20  Yes Rutherford Guys, MD  diclofenac Sodium (VOLTAREN) 1 % GEL APPLY 4 G TOPICALLY 4 (FOUR) TIMES DAILY. Patient taking differently: Apply 4 g topically 4 (four) times daily as needed (joint pain).  04/18/20  Yes Rutherford Guys, MD  fluticasone  (FLONASE) 50 MCG/ACT nasal spray PLACE 1-2 SPRAYS INTO BOTH NOSTRILS AT BEDTIME. Patient taking differently: Place 1-2 sprays into both nostrils 3 times/day as needed-between meals & bedtime for allergies.  02/02/20  Yes Rutherford Guys, MD  furosemide (LASIX) 40 MG tablet Take 1 tablet (40 mg total) by mouth 2 (two) times daily. 04/11/20  Yes Rutherford Guys, MD  Glycerin-Hypromellose-PEG 400 (DRY EYE RELIEF DROPS OP) Place 1 drop into the left eye daily as needed (dry eyes).   Yes [provider]  levothyroxine (SYNTHROID) 88 MCG tablet TAKE ONE TABLET BY MOUTH DAILY BEFORE BREAKFAST Patient taking differently: Take 88 mcg by mouth daily before breakfast.  03/04/20  Yes Rutherford Guys, MD  metoprolol tartrate (LOPRESSOR) 100 MG tablet Take 1 tablet (100 mg total) by mouth 2 (two) times daily. 06/02/20  Yes Allie Bossier, MD  ondansetron (ZOFRAN) 4 MG tablet Take 1 tablet (4 mg total) by mouth every 6 (six) hours as needed for nausea. 06/02/20  Yes Allie Bossier, MD  pantoprazole (PROTONIX) 20 MG tablet Take 1 tablet (20 mg total) by mouth daily. 06/03/20  Yes Allie Bossier, MD  polyethylene glycol (MIRALAX / GLYCOLAX) 17 g packet Take 17 g by mouth daily as needed for mild constipation. 06/02/20  Yes Allie Bossier, MD  sitaGLIPtin (JANUVIA) 50 MG tablet Take 1 tablet (50 mg total) by mouth daily. Patient taking differently: Take 50 mg by mouth every morning.  05/13/20  Yes Rutherford Guys, MD  vitamin E 100 UNIT capsule Take 100 Units by mouth daily.   Yes [provider]  acetic acid 2 % otic solution Place 4 drops into both ears 3 (three) times daily. Patient not taking: Reported on 06/16/2020 09/17/19   Rutherford Guys, MD  amLODipine (NORVASC) 10 MG tablet TAKE 1 TABLET (10 MG TOTAL) BY MOUTH DAILY. Patient not taking: Reported on 06/16/2020 03/04/20   Rutherford Guys, MD  brimonidine [facility]) 0.2 % ophthalmic solution Place 1 drop into the left eye 2 (two) times daily.    Patient not taking: Reported on 06/16/2020 10/27/19   [provider]  CYCLOGYL 2 % ophthalmic solution Place 1 drop into the left eye 3 (three) times daily.  Patient not taking: Reported on 06/16/2020 10/27/19   [provider]  famotidine (PEPCID) 20 MG tablet Take 1 tablet (20 mg total) by mouth at bedtime. Patient not taking: Reported on 06/16/2020 05/09/20   Rutherford Guys, MD  omeprazole (PRILOSEC) 40 MG capsule TAKE 1 CAPSULE (40 MG TOTAL) BY MOUTH TWO (TWO) TIMES DAILY. 12/15/19   Thornton Park, MD    Past Medical History:  Diagnosis Date  .  Abnormal EKG 02/07/2016   Inferolateral T wave inversion and ST depression.  . Acute calculous cholecystitis   . Anxiety   . Arthritis   . Chronic diastolic CHF (congestive heart failure) ([place]) 02/21/2018  . Colon polyps 11/2008   tubular adenoma  . Diabetes mellitus   . Esophageal problem    esophageal dilation  . GERD (gastroesophageal reflux disease)    + hpylori  . GI bleeding 01/2018  . Headache   . Hyperlipidemia   . Hypertension   . Hypothyroidism   . Ischemic colitis ([place])   . Renal cancer, right ([place]) 2010   s/p Rt nephrectomy by Dr. Rosana Hoes  . Renal insufficiency     Past Surgical History:  Procedure Laterality Date  . ABDOMINAL HYSTERECTOMY  1978   partial  . CATARACT EXTRACTION    . CHOLECYSTECTOMY N/A 04/04/2016   Procedure: LAPAROSCOPIC CHOLECYSTECTOMY;  Surgeon: Autumn Messing III, MD;  Location: [facility];  Service: General;  Laterality: N/A;  . COLONOSCOPY    . ESOPHAGEAL DILATION  2016  . HERNIA REPAIR    . LAPAROSCOPIC CHOLECYSTECTOMY  04/04/2016  . NEPHRECTOMY Right 2010   Dr. Rosana Hoes, urology, due to renal cancer    Social History   Tobacco Use  . Smoking status: Current Some Day Smoker    Packs/day: 0.25    Years: 55.00    Pack years: 13.75    Types: Cigarettes  . Smokeless tobacco: Never Used  . Tobacco comment: cut back amount still  trying to quit  Substance Use Topics  . Alcohol use:  No    Alcohol/week: 0.0 standard drinks    Family History  Problem Relation Age of Onset  . Cancer Mother        colon, kidney cancer  . Cancer Father        throat cancer  . Cancer Sister   . Heart attack Brother     Review of Systems  Constitutional: Negative for chills, fever and malaise/fatigue.  Eyes: Negative for blurred vision and double vision.  Respiratory: Negative for cough and shortness of breath.   Cardiovascular: Negative for chest pain, palpitations and leg swelling.  Gastrointestinal: Negative for abdominal pain, blood in stool, nausea and vomiting.  Neurological: Negative for dizziness and headaches.     OBJECTIVE:  Today's Vitals   06/16/20 1608 06/16/20 1628  BP: (!) 202/98 (!) 189/104  Pulse: 74   Temp: 97.7 F (36.5 C)   SpO2: 97%   Weight: 176 lb (79.8 kg)   Height: 5\' 6"  (1.676 m)    Body mass index is 28.41 kg/m.   BP Readings from Last 3 Encounters:  06/16/20 (!) 189/104  06/02/20 129/71  05/09/20 134/78   Wt Readings from Last 3 Encounters:  06/16/20 176 lb (79.8 kg)  05/09/20 176 lb 9.6 oz (80.1 kg)  04/11/20 177 lb (80.3 kg)     Physical Exam Vitals and nursing note reviewed.  Constitutional:      Appearance: She is well-developed.  HENT:     Head: Normocephalic and atraumatic.     Mouth/Throat:     Pharynx: No oropharyngeal exudate.  Eyes:     General: No scleral icterus.    Conjunctiva/sclera: Conjunctivae normal.     Pupils: Pupils are equal, round, and reactive to light.  Cardiovascular:     Rate and Rhythm: Normal rate and regular rhythm.     Heart sounds: Normal heart sounds. No murmur heard.  No friction rub. No gallop.  Pulmonary:     Effort: Pulmonary effort is normal.     Breath sounds: Normal breath sounds. No wheezing or rales.  Musculoskeletal:     Cervical back: Neck supple.  Skin:    General: Skin is warm and dry.  Neurological:     Mental Status: She is alert and oriented to person, place, and  time.     No results found for this or any previous visit (from the past 24 hour(s)).  No results found.   ASSESSMENT and PLAN  1. Lower GI bleed Assumed to be diverticular in nature, resolved, stable H/H in hospital  2. Essential hypertension Above goal in setting of being off medications. Asymptomatic. Medication regime clarified. Recheck at next OV.   3. Type 2 diabetes mellitus with diabetic polyneuropathy, without long-term current use of insulin (HCC) Last a1c above goal. Patient had only been taking [place]. Restart low dose glipizide, mindful GFR 42  4. Chronic diastolic CHF (congestive heart failure) ([place]) euvolemic in clinic today. - furosemide (LASIX) 40 MG tablet; Take 1 tablet (40 mg total) by mouth 2 (two) times daily.  Other orders - glipiZIDE (GLUCOTROL) 10 MG tablet; Take 0.5 tablets (5 mg total) by mouth daily before breakfast. - metoprolol tartrate (LOPRESSOR) 100 MG tablet; Take 1 tablet (100 mg total) by mouth 2 (two) times daily. - pantoprazole (PROTONIX) 20 MG tablet; Take 1 tablet (20 mg total) by mouth daily.  Return for as scheduled.    Rutherford Guys, MD Primary Care at [address], [place] [address] Ph.  [phone] Fax [phone]

## 2020-06-17 ENCOUNTER — Other Ambulatory Visit: Payer: Self-pay | Admitting: Family Medicine

## 2020-06-17 DIAGNOSIS — Z9109 Other allergy status, other than to drugs and biological substances: Secondary | ICD-10-CM

## 2020-08-08 ENCOUNTER — Other Ambulatory Visit: Payer: Self-pay

## 2020-08-08 ENCOUNTER — Ambulatory Visit (INDEPENDENT_AMBULATORY_CARE_PROVIDER_SITE_OTHER): Payer: Medicare Other | Admitting: Family Medicine

## 2020-08-08 ENCOUNTER — Encounter: Payer: Self-pay | Admitting: Family Medicine

## 2020-08-08 VITALS — BP 140/84 | HR 70 | Temp 98.0°F | Ht 66.0 in | Wt 175.8 lb

## 2020-08-08 DIAGNOSIS — I5032 Chronic diastolic (congestive) heart failure: Secondary | ICD-10-CM

## 2020-08-08 DIAGNOSIS — Z23 Encounter for immunization: Secondary | ICD-10-CM | POA: Diagnosis not present

## 2020-08-08 DIAGNOSIS — I10 Essential (primary) hypertension: Secondary | ICD-10-CM | POA: Diagnosis not present

## 2020-08-08 DIAGNOSIS — E1142 Type 2 diabetes mellitus with diabetic polyneuropathy: Secondary | ICD-10-CM

## 2020-08-08 DIAGNOSIS — E038 Other specified hypothyroidism: Secondary | ICD-10-CM | POA: Diagnosis not present

## 2020-08-08 DIAGNOSIS — K219 Gastro-esophageal reflux disease without esophagitis: Secondary | ICD-10-CM

## 2020-08-08 DIAGNOSIS — N1831 Chronic kidney disease, stage 3a: Secondary | ICD-10-CM | POA: Diagnosis not present

## 2020-08-08 MED ORDER — FAMOTIDINE 20 MG PO TABS: 20.0000 mg | ORAL_TABLET | Freq: Every day | ORAL | 1 refills | Status: DC

## 2020-08-08 MED ORDER — FUROSEMIDE 40 MG PO TABS: 20.0000 mg | ORAL_TABLET | Freq: Every day | ORAL | 0 refills | Status: AC

## 2020-08-08 NOTE — Patient Instructions (Signed)
° ° ° °  If you have lab work done today you will be contacted with your lab results within the next 2 weeks.  If you have not heard from us then please contact us. The fastest way to get your results is to register for My Chart. ° ° °IF you received an x-ray today, you will receive an invoice from [facility]. Please contact [facility] at [phone] with questions or concerns regarding your invoice.  ° °IF you received labwork today, you will receive an invoice from [facility]. Please contact [facility] at [phone] with questions or concerns regarding your invoice.  ° °Our billing staff will not be able to assist you with questions regarding bills from these companies. ° °You will be contacted with the lab results as soon as they are available. The fastest way to get your results is to activate your My Chart account. Instructions are located on the last page of this paperwork. If you have not heard from us regarding the results in 2 weeks, please contact this office. °  ° ° ° °

## 2020-08-08 NOTE — Progress Notes (Signed)
9/13/20213:09 PM  Sherri Burns May 05, 1936, 84 y.o., female [id number]  Chief Complaint  Patient presents with  . Follow-up    3 month follow up, brought meds to make sure she is taking right meds. While in the hosp, given protonix and pepcid, which one she be on?    HPI:   Patient is a 84 y.o. female with past medical history significant for DM2, CKD3, s/p nephrectomy, HTN, anxietyon long term bzd, hypothyroidism, HLPwith statin intolerance who presents today for routine followup  Last OV July 2021  - restarted BP meds and glipizide  She has not been taking her levothyroxine for past several weeks Just picked up refill She has been taking lasix 20mg  once a day, no edema She has not been taking pepcid at bedtime, having breaking thru reflux at bedtime She denies any low cbgs Brings in her meds to review  Lab Results  Component Value Date   HGBA1C 8.5 (H) 05/09/2020   HGBA1C 7.0 (H) 11/09/2019   HGBA1C 7.4 (H) 08/07/2019   Lab Results  Component Value Date   MICROALBUR 0.7 10/02/2016   LDLCALC 136 (H) 05/09/2020   CREATININE 1.36 (H) 06/02/2020   Lab Results  Component Value Date   TSH 0.682 02/08/2020   CBC Latest Ref Rng & Units 06/02/2020 06/01/2020 05/31/2020  WBC 4.0 - 10.5 K/uL 6.3 5.1 -  Hemoglobin 12.0 - 15.0 g/dL 12.5 12.3 13.0  Hematocrit 36 - 46 % 37.9 37.8 38.9  Platelets 150 - 400 K/uL 180 189 -    Depression screen [facility] 2/9 05/09/2020 04/11/2020 03/24/2020  Decreased Interest 0 0 0  Down, Depressed, Hopeless 2 0 0  PHQ - 2 Score 2 0 0  Altered sleeping 2 - -  Tired, decreased energy 2 - -  Change in appetite 0 - -  Feeling bad or failure about yourself  0 - -  Trouble concentrating 0 - -  Moving slowly or fidgety/restless 0 - -  Suicidal thoughts - - -  PHQ-9 Score 6 - -  Difficult doing work/chores Not difficult at all - -  Some recent data might be hidden    Fall Risk  06/16/2020 05/09/2020 04/11/2020 03/24/2020 02/08/2020  Falls in the past  year? 0 0 0 0 0  Number falls in past yr: 0 0 0 0 0  Injury with Fall? 0 0 0 0 0  Follow up Falls evaluation completed Falls evaluation completed - Falls evaluation completed Falls evaluation completed     Allergies  Allergen Reactions  . Ace Inhibitors Swelling    See 02/16/19   . Codeine Other (See Comments)    hallucinations  . Metformin And Related     Upset stomach  . Ciprocin-Fluocin-Procin [Fluocinolone Acetonide] Other (See Comments)    Unknown reaction  . Clonidine Derivatives Other (See Comments)    Dry mouth  . Crestor [Rosuvastatin Calcium] Other (See Comments)    cramps  . Penicillins Other (See Comments)    Has patient had a PCN reaction causing immediate rash, facial/tongue/throat swelling, SOB or lightheadedness with hypotension: no Has patient had a PCN reaction causing severe rash involving mucus membranes or skin necrosis: no Has patient had a PCN reaction that required hospitalization : no Has patient had a PCN reaction occurring within the last 10 years: no -Hallucinates If all of the above answers are "NO", then may proceed with Cephalosporin use.   . Simvastatin Other (See Comments)    Upset stomach   .  Tessalon Perles Other (See Comments)    Gi upset     Prior to Admission medications   Medication Sig Start Date End Date Taking? Authorizing Provider  acetaminophen (TYLENOL) 325 MG tablet Take 2 tablets (650 mg total) by mouth every 6 (six) hours as needed for mild pain (or Fever >/= 101). 06/02/20  Yes Allie Bossier, MD  acetic acid 2 % otic solution Place 4 drops into both ears 3 (three) times daily. 09/17/19  Yes Rutherford Guys, MD  amLODipine (NORVASC) 10 MG tablet TAKE 1 TABLET (10 MG TOTAL) BY MOUTH DAILY. 03/04/20  Yes Rutherford Guys, MD  brimonidine (ALPHAGAN) 0.2 % ophthalmic solution Place 1 drop into the left eye 2 (two) times daily.  10/27/19  Yes [provider]  cholecalciferol (VITAMIN D) 1000 units tablet Take 1,000 Units by  mouth daily.   Yes [provider]  clonazePAM (KLONOPIN) 1 MG tablet TAKE HALF TABLET BY MOUTH EVERY MORNING AND TAKE ONE TABLET BY MOUTH AT BEDTIME Patient taking differently: Take 0.5-1 mg by mouth See admin instructions. Take 1/2 tablet (0.5mg ) by mouth daily in the morning and take 1 tablet (1mg ) by mouth daily at bedtime. 05/09/20  Yes Rutherford Guys, MD  CYCLOGYL 2 % ophthalmic solution Place 1 drop into the left eye 3 (three) times daily.  10/27/19  Yes [provider]  diclofenac Sodium (VOLTAREN) 1 % GEL APPLY 4 G TOPICALLY 4 (FOUR) TIMES DAILY. Patient taking differently: Apply 4 g topically 4 (four) times daily as needed (joint pain).  04/18/20  Yes Rutherford Guys, MD  fluticasone [facility]) 50 MCG/ACT nasal spray Place 1-2 sprays into both nostrils daily. 06/17/20  Yes Rutherford Guys, MD  furosemide (LASIX) 40 MG tablet Take 1 tablet (40 mg total) by mouth 2 (two) times daily. 06/16/20  Yes Rutherford Guys, MD  glipiZIDE (GLUCOTROL) 10 MG tablet Take 0.5 tablets (5 mg total) by mouth daily before breakfast. 06/16/20  Yes Rutherford Guys, MD  Glycerin-Hypromellose-PEG 400 (DRY EYE RELIEF DROPS OP) Place 1 drop into the left eye daily as needed (dry eyes).   Yes [provider]  levothyroxine (SYNTHROID) 88 MCG tablet TAKE ONE TABLET BY MOUTH DAILY BEFORE BREAKFAST Patient taking differently: Take 88 mcg by mouth daily before breakfast.  03/04/20  Yes Rutherford Guys, MD  metoprolol tartrate (LOPRESSOR) 100 MG tablet Take 1 tablet (100 mg total) by mouth 2 (two) times daily. 06/16/20  Yes Rutherford Guys, MD  ondansetron (ZOFRAN) 4 MG tablet Take 1 tablet (4 mg total) by mouth every 6 (six) hours as needed for nausea. 06/02/20  Yes Allie Bossier, MD  polyethylene glycol (MIRALAX / GLYCOLAX) 17 g packet Take 17 g by mouth daily as needed for mild constipation. 06/02/20  Yes Allie Bossier, MD  sitaGLIPtin (JANUVIA) 50 MG tablet Take 1 tablet (50 mg total) by mouth  daily. Patient taking differently: Take 50 mg by mouth every morning.  05/13/20  Yes Rutherford Guys, MD  vitamin E 100 UNIT capsule Take 100 Units by mouth daily.   Yes [provider]  pantoprazole (PROTONIX) 20 MG tablet Take 1 tablet (20 mg total) by mouth daily. Patient not taking: Reported on 08/08/2020 06/16/20   Rutherford Guys, MD  omeprazole (PRILOSEC) 40 MG capsule TAKE 1 CAPSULE (40 MG TOTAL) BY MOUTH TWO (TWO) TIMES DAILY. 12/15/19   Thornton Park, MD    Past Medical History:  Diagnosis Date  .  Abnormal EKG 02/07/2016   Inferolateral T wave inversion and ST depression.  . Acute calculous cholecystitis   . Anxiety   . Arthritis   . Chronic diastolic CHF (congestive heart failure) ([place]) 02/21/2018  . Colon polyps 11/2008   tubular adenoma  . Diabetes mellitus   . Esophageal problem    esophageal dilation  . GERD (gastroesophageal reflux disease)    + hpylori  . GI bleeding 01/2018  . Headache   . Hyperlipidemia   . Hypertension   . Hypothyroidism   . Ischemic colitis ([place])   . Renal cancer, right ([place]) 2010   s/p Rt nephrectomy by Dr. Rosana Hoes  . Renal insufficiency     Past Surgical History:  Procedure Laterality Date  . ABDOMINAL HYSTERECTOMY  1978   partial  . CATARACT EXTRACTION    . CHOLECYSTECTOMY N/A 04/04/2016   Procedure: LAPAROSCOPIC CHOLECYSTECTOMY;  Surgeon: Autumn Messing III, MD;  Location: [facility];  Service: General;  Laterality: N/A;  . COLONOSCOPY    . ESOPHAGEAL DILATION  2016  . HERNIA REPAIR    . LAPAROSCOPIC CHOLECYSTECTOMY  04/04/2016  . NEPHRECTOMY Right 2010   Dr. Rosana Hoes, urology, due to renal cancer    Social History   Tobacco Use  . Smoking status: Current Some Day Smoker    Packs/day: 0.25    Years: 55.00    Pack years: 13.75    Types: Cigarettes  . Smokeless tobacco: Never Used  . Tobacco comment: cut back amount still  trying to quit  Substance Use Topics  . Alcohol use: No    Alcohol/week: 0.0 standard drinks     Family History  Problem Relation Age of Onset  . Cancer Mother        colon, kidney cancer  . Cancer Father        throat cancer  . Cancer Sister   . Heart attack Brother     Review of Systems  Constitutional: Negative for chills and fever.  Respiratory: Negative for cough and shortness of breath.   Cardiovascular: Negative for chest pain, palpitations and leg swelling.  Gastrointestinal: Positive for heartburn. Negative for abdominal pain, blood in stool, melena, nausea and vomiting.     OBJECTIVE:  Today's Vitals   08/08/20 1500  BP: 140/84  Pulse: 70  Temp: 98 F (36.7 C)  SpO2: 97%  Weight: 175 lb 12.8 oz (79.7 kg)  Height: 5\' 6"  (1.676 m)   Body mass index is 28.37 kg/m.   Physical Exam Vitals and nursing note reviewed.  Constitutional:      Appearance: She is well-developed.  HENT:     Head: Normocephalic and atraumatic.     Mouth/Throat:     Pharynx: No oropharyngeal exudate.  Eyes:     General: No scleral icterus.    Extraocular Movements: Extraocular movements intact.     Conjunctiva/sclera: Conjunctivae normal.     Pupils: Pupils are equal, round, and reactive to light.  Cardiovascular:     Rate and Rhythm: Normal rate and regular rhythm.     Heart sounds: Normal heart sounds. No murmur heard.  No friction rub. No gallop.   Pulmonary:     Effort: Pulmonary effort is normal.     Breath sounds: No wheezing, rhonchi or rales.  Musculoskeletal:     Cervical back: Neck supple.     Right lower leg: No edema.     Left lower leg: No edema.  Skin:    General: Skin is warm and dry.  Neurological:     Mental Status: She is alert and oriented to person, place, and time.     No results found for this or any previous visit (from the past 24 hour(s)).  No results found.   ASSESSMENT and PLAN  1. Essential hypertension Controlled. Continue current regime.   2. Type 2 diabetes mellitus with diabetic polyneuropathy, without long-term current  use of insulin ([place]) Checking labs today, medications will be adjusted as needed.  - Hemoglobin A1c  3. Stage 3a chronic kidney disease - Comprehensive metabolic panel  4. Other specified hypothyroidism Labs pending. Anticipate high TSH as off meds. - TSH  5. Chronic diastolic CHF (congestive heart failure) ([place]) euvolemic in clinic. Cont current regime - furosemide (LASIX) 40 MG tablet; Take 0.5 tablets (20 mg total) by mouth daily.  6. Gastroesophageal reflux disease without esophagitis Not controlled. Add pepcid at bedtime. Con protonix AM  7. Need for prophylactic vaccination and inoculation against influenza - Flu Vaccine QUAD High Dose(Fluad)  Other orders - famotidine (PEPCID) 20 MG tablet; Take 1 tablet (20 mg total) by mouth at bedtime.  Return in about 3 months (around 11/07/2020).    Rutherford Guys, MD Primary Care at [address] [place], [place] [address] Ph.  [phone] Fax [phone]

## 2020-08-09 LAB — COMPREHENSIVE METABOLIC PANEL
ALT: 18 IU/L (ref 0–32)
AST: 16 IU/L (ref 0–40)
Albumin/Globulin Ratio: 1.6 (ref 1.2–2.2)
Albumin: 4.5 g/dL (ref 3.6–4.6)
Alkaline Phosphatase: 72 IU/L (ref 44–121)
BUN/Creatinine Ratio: 15 (ref 12–28)
BUN: 18 mg/dL (ref 8–27)
Bilirubin Total: 0.5 mg/dL (ref 0.0–1.2)
CO2: 26 mmol/L (ref 20–29)
Calcium: 9.9 mg/dL (ref 8.7–10.3)
Chloride: 102 mmol/L (ref 96–106)
Creatinine, Ser: 1.23 mg/dL — ABNORMAL HIGH (ref 0.57–1.00)
GFR calc Af Amer: 48 mL/min/{1.73_m2} — ABNORMAL LOW (ref >59)
GFR calc non Af Amer: 41 mL/min/{1.73_m2} — ABNORMAL LOW (ref >59)
Globulin, Total: 2.9 g/dL (ref 1.5–4.5)
Glucose: 78 mg/dL (ref 65–99)
Potassium: 4.5 mmol/L (ref 3.5–5.2)
Sodium: 142 mmol/L (ref 134–144)
Total Protein: 7.4 g/dL (ref 6.0–8.5)

## 2020-08-09 LAB — HEMOGLOBIN A1C
Est. average glucose Bld gHb Est-mCnc: 186 mg/dL
Hgb A1c MFr Bld: 8.1 % — ABNORMAL HIGH (ref 4.8–5.6)

## 2020-08-09 LAB — TSH: TSH: 0.159 u[IU]/mL — ABNORMAL LOW (ref 0.450–4.500)

## 2020-08-18 ENCOUNTER — Other Ambulatory Visit: Payer: Self-pay | Admitting: Family Medicine

## 2020-08-18 DIAGNOSIS — E038 Other specified hypothyroidism: Secondary | ICD-10-CM

## 2020-08-18 MED ORDER — LEVOTHYROXINE SODIUM 75 MCG PO TABS: 75.0000 ug | ORAL_TABLET | Freq: Every day | ORAL | 1 refills | Status: DC

## 2020-09-01 ENCOUNTER — Encounter: Payer: Self-pay | Admitting: Family Medicine

## 2020-09-01 ENCOUNTER — Ambulatory Visit (INDEPENDENT_AMBULATORY_CARE_PROVIDER_SITE_OTHER): Payer: Medicare Other | Admitting: Family Medicine

## 2020-09-01 ENCOUNTER — Other Ambulatory Visit: Payer: Self-pay

## 2020-09-01 VITALS — BP 135/77 | HR 71 | Temp 97.8°F | Resp 15 | Ht 66.0 in | Wt 172.8 lb

## 2020-09-01 DIAGNOSIS — R102 Pelvic and perineal pain: Secondary | ICD-10-CM

## 2020-09-01 DIAGNOSIS — E038 Other specified hypothyroidism: Secondary | ICD-10-CM | POA: Diagnosis not present

## 2020-09-01 DIAGNOSIS — R197 Diarrhea, unspecified: Secondary | ICD-10-CM | POA: Diagnosis not present

## 2020-09-01 MED ORDER — FAMOTIDINE 20 MG PO TABS: 20.0000 mg | ORAL_TABLET | Freq: Two times a day (BID) | ORAL | 1 refills | Status: DC

## 2020-09-01 NOTE — Patient Instructions (Addendum)
    Stop januvia and pantoprazole Increase famotidine to twice a day  If you have lab work done today you will be contacted with your lab results within the next 2 weeks.  If you have not heard from [place] then please contact us. The fastest way to get your results is to register for My Chart.   IF you received an x-ray today, you will receive an invoice from [facility]. Please contact [facility] at [phone] with questions or concerns regarding your invoice.   IF you received labwork today, you will receive an invoice from [place]. Please contact [facility] at [phone] with questions or concerns regarding your invoice.   Our billing staff will not be able to assist you with questions regarding bills from these companies.  You will be contacted with the lab results as soon as they are available. The fastest way to get your results is to activate your My Chart account. Instructions are located on the last page of this paperwork. If you have not heard from [place] regarding the results in 2 weeks, please contact this office.

## 2020-09-01 NOTE — Progress Notes (Signed)
10/7/202111:30 AM  Sherri Burns Jul 22, 1936, 84 y.o., female [id number]  Chief Complaint  Patient presents with  . Medication Management    pt notes when she takes her medications she gets dizzy and cant function well notes recently has had upset stomach since starting new Rx last visit thinks she is taking too many medications     HPI:   Patient is a 84 y.o. female with past medical history significant for DM2, CKD3, s/p nephrectomy, HTN, anxietyon long term bzd, hypothyroidism, HLPwith statin intolerance who presents today for routine followup  Last OV 3 weeks ago - decreased her levothyroxine  She is overall doing ok She changed her levothyroxine as instructed She has been having abd pain, nausea, diarrhea since started taking pantoprazole and januvia She has stopped taking these meds and diarrhea has resolved She continues to have chronic vaginanl/rectal pain, requesting referral to obgyn   Lab Results  Component Value Date   TSH 0.159 (L) 08/08/2020   Lab Results  Component Value Date   HGBA1C 8.1 (H) 08/08/2020   HGBA1C 8.5 (H) 05/09/2020   HGBA1C 7.0 (H) 11/09/2019   Lab Results  Component Value Date   MICROALBUR 0.7 10/02/2016   LDLCALC 136 (H) 05/09/2020   CREATININE 1.23 (H) 08/08/2020   Wt Readings from Last 3 Encounters:  08/08/20 175 lb 12.8 oz (79.7 kg)  06/16/20 176 lb (79.8 kg)  05/09/20 176 lb 9.6 oz (80.1 kg)   BP Readings from Last 3 Encounters:  08/08/20 140/84  06/16/20 (!) 189/104  06/02/20 129/71    Depression screen PHQ 2/9 09/01/2020 05/09/2020 04/11/2020  Decreased Interest 0 0 0  Down, Depressed, Hopeless 0 2 0  PHQ - 2 Score 0 2 0  Altered sleeping - 2 -  Tired, decreased energy - 2 -  Change in appetite - 0 -  Feeling bad or failure about yourself  - 0 -  Trouble concentrating - 0 -  Moving slowly or fidgety/restless - 0 -  Suicidal thoughts - - -  PHQ-9 Score - 6 -  Difficult doing work/chores - Not difficult at all -    Some recent data might be hidden    Fall Risk  09/01/2020 06/16/2020 05/09/2020 04/11/2020 03/24/2020  Falls in the past year? 0 0 0 0 0  Number falls in past yr: 0 0 0 0 0  Injury with Fall? 0 0 0 0 0  Risk for fall due to : No Fall Risks - - - -  Follow up Falls evaluation completed Falls evaluation completed Falls evaluation completed - Falls evaluation completed     Allergies  Allergen Reactions  . Ace Inhibitors Swelling    See 02/16/19   . Codeine Other (See Comments)    hallucinations  . Metformin And Related     Upset stomach  . Ciprocin-Fluocin-Procin [Fluocinolone Acetonide] Other (See Comments)    Unknown reaction  . Clonidine Derivatives Other (See Comments)    Dry mouth  . Crestor [Rosuvastatin Calcium] Other (See Comments)    cramps  . Penicillins Other (See Comments)    Has patient had a PCN reaction causing immediate rash, facial/tongue/throat swelling, SOB or lightheadedness with hypotension: no Has patient had a PCN reaction causing severe rash involving mucus membranes or skin necrosis: no Has patient had a PCN reaction that required hospitalization : no Has patient had a PCN reaction occurring within the last 10 years: no -Hallucinates If all of the above answers are "NO", then may  proceed with Cephalosporin use.   . Simvastatin Other (See Comments)    Upset stomach   . Tessalon Perles Other (See Comments)    Gi upset     Prior to Admission medications   Medication Sig Start Date End Date Taking? Authorizing Provider  acetaminophen (TYLENOL) 325 MG tablet Take 2 tablets (650 mg total) by mouth every 6 (six) hours as needed for mild pain (or Fever >/= 101). 06/02/20  Yes Allie Bossier, MD  acetic acid 2 % otic solution Place 4 drops into both ears 3 (three) times daily. 09/17/19  Yes Rutherford Guys, MD  amLODipine (NORVASC) 10 MG tablet TAKE 1 TABLET (10 MG TOTAL) BY MOUTH DAILY. 03/04/20  Yes Rutherford Guys, MD  brimonidine (ALPHAGAN) 0.2 % ophthalmic  solution Place 1 drop into the left eye 2 (two) times daily.  10/27/19  Yes [provider]  cholecalciferol (VITAMIN D) 1000 units tablet Take 1,000 Units by mouth daily.   Yes [provider]  clonazePAM (KLONOPIN) 1 MG tablet TAKE HALF TABLET BY MOUTH EVERY MORNING AND TAKE ONE TABLET BY MOUTH AT BEDTIME Patient taking differently: Take 0.5-1 mg by mouth See admin instructions. Take 1/2 tablet (0.5mg ) by mouth daily in the morning and take 1 tablet (1mg ) by mouth daily at bedtime. 05/09/20  Yes Rutherford Guys, MD  CYCLOGYL 2 % ophthalmic solution Place 1 drop into the left eye 3 (three) times daily.  10/27/19  Yes [provider]  diclofenac Sodium (VOLTAREN) 1 % GEL APPLY 4 G TOPICALLY 4 (FOUR) TIMES DAILY. Patient taking differently: Apply 4 g topically 4 (four) times daily as needed (joint pain).  04/18/20  Yes Rutherford Guys, MD  famotidine (PEPCID) 20 MG tablet Take 1 tablet (20 mg total) by mouth at bedtime. 08/08/20  Yes Rutherford Guys, MD  fluticasone [facility]) 50 MCG/ACT nasal spray Place 1-2 sprays into both nostrils daily. 06/17/20  Yes Rutherford Guys, MD  furosemide (LASIX) 40 MG tablet Take 0.5 tablets (20 mg total) by mouth daily. 08/08/20  Yes Rutherford Guys, MD  glipiZIDE (GLUCOTROL) 10 MG tablet Take 0.5 tablets (5 mg total) by mouth daily before breakfast. 06/16/20  Yes Rutherford Guys, MD  levothyroxine (SYNTHROID) 75 MCG tablet Take 1 tablet (75 mcg total) by mouth daily before breakfast. 08/18/20  Yes Rutherford Guys, MD  metoprolol tartrate (LOPRESSOR) 100 MG tablet Take 1 tablet (100 mg total) by mouth 2 (two) times daily. 06/16/20  Yes Rutherford Guys, MD  ondansetron (ZOFRAN) 4 MG tablet Take 1 tablet (4 mg total) by mouth every 6 (six) hours as needed for nausea. 06/02/20  Yes Allie Bossier, MD  pantoprazole (PROTONIX) 20 MG tablet Take 1 tablet (20 mg total) by mouth daily. 06/16/20  Yes Rutherford Guys, MD  polyethylene glycol (MIRALAX /  GLYCOLAX) 17 g packet Take 17 g by mouth daily as needed for mild constipation. 06/02/20  Yes Allie Bossier, MD  sitaGLIPtin (JANUVIA) 50 MG tablet Take 1 tablet (50 mg total) by mouth daily. Patient taking differently: Take 50 mg by mouth every morning.  05/13/20  Yes Rutherford Guys, MD  vitamin E 100 UNIT capsule Take 100 Units by mouth daily.   Yes [provider]  omeprazole (PRILOSEC) 40 MG capsule TAKE 1 CAPSULE (40 MG TOTAL) BY MOUTH TWO (TWO) TIMES DAILY. 12/15/19   Thornton Park, MD    Past Medical History:  Diagnosis Date  . Abnormal  EKG 02/07/2016   Inferolateral T wave inversion and ST depression.  . Acute calculous cholecystitis   . Anxiety   . Arthritis   . Chronic diastolic CHF (congestive heart failure) ([place]) 02/21/2018  . Colon polyps 11/2008   tubular adenoma  . Diabetes mellitus   . Esophageal problem    esophageal dilation  . GERD (gastroesophageal reflux disease)    + hpylori  . GI bleeding 01/2018  . Headache   . Hyperlipidemia   . Hypertension   . Hypothyroidism   . Ischemic colitis ([place])   . Renal cancer, right ([place]) 2010   s/p Rt nephrectomy by Dr. Rosana Hoes  . Renal insufficiency     Past Surgical History:  Procedure Laterality Date  . ABDOMINAL HYSTERECTOMY  1978   partial  . CATARACT EXTRACTION    . CHOLECYSTECTOMY N/A 04/04/2016   Procedure: LAPAROSCOPIC CHOLECYSTECTOMY;  Surgeon: Autumn Messing III, MD;  Location: [facility];  Service: General;  Laterality: N/A;  . COLONOSCOPY    . ESOPHAGEAL DILATION  2016  . HERNIA REPAIR    . LAPAROSCOPIC CHOLECYSTECTOMY  04/04/2016  . NEPHRECTOMY Right 2010   Dr. Rosana Hoes, urology, due to renal cancer    Social History   Tobacco Use  . Smoking status: Current Some Day Smoker    Packs/day: 0.25    Years: 55.00    Pack years: 13.75    Types: Cigarettes  . Smokeless tobacco: Never Used  . Tobacco comment: cut back amount still  trying to quit  Substance Use Topics  . Alcohol use: No    Alcohol/week:  0.0 standard drinks    Family History  Problem Relation Age of Onset  . Cancer Mother        colon, kidney cancer  . Cancer Father        throat cancer  . Cancer Sister   . Heart attack Brother     Review of Systems  Constitutional: Negative for chills and fever.  Respiratory: Negative for cough and shortness of breath.   Cardiovascular: Negative for chest pain, palpitations and leg swelling.   Per hpi  OBJECTIVE:  Today's Vitals   09/01/20 1133  BP: 135/77  Pulse: 71  Resp: 15  Temp: 97.8 F (36.6 C)  TempSrc: Temporal  SpO2: 97%  Weight: 172 lb 12.8 oz (78.4 kg)  Height: 5\' 6"  (1.676 m)   Body mass index is 27.89 kg/m.  Orthostatic VS for the past 24 hrs (Last 3 readings):  BP- Lying BP- Standing at 0 minutes BP- Standing at 3 minutes  09/01/20 1142 136/74 132/72 138/80    Physical Exam Vitals and nursing note reviewed.  Constitutional:      Appearance: She is well-developed.  HENT:     Head: Normocephalic and atraumatic.     Mouth/Throat:     Pharynx: No oropharyngeal exudate.  Eyes:     General: No scleral icterus.    Extraocular Movements: Extraocular movements intact.     Conjunctiva/sclera: Conjunctivae normal.     Pupils: Pupils are equal, round, and reactive to light.  Cardiovascular:     Rate and Rhythm: Normal rate and regular rhythm.     Heart sounds: Normal heart sounds. No murmur heard.  No friction rub. No gallop.   Pulmonary:     Effort: Pulmonary effort is normal.     Breath sounds: Normal breath sounds. No wheezing, rhonchi or rales.  Abdominal:     General: Bowel sounds are normal. There is no distension.  Palpations: Abdomen is soft. There is no mass.     Tenderness: There is no abdominal tenderness.  Musculoskeletal:     Cervical back: Neck supple.  Skin:    General: Skin is warm and dry.  Neurological:     Mental Status: She is alert and oriented to person, place, and time.     No results found for this or any  previous visit (from the past 24 hour(s)).  No results found.   ASSESSMENT and PLAN  1. Diarrhea, unspecified type Resolved once d/c januvia and pantoprazole  2. Vaginal pain - Ambulatory referral to Obstetrics / Gynecology  3. Other specified hypothyroidism On new lower dose, needs TSH rechecked at next OV  Other orders - famotidine (PEPCID) 20 MG tablet; Take 1 tablet (20 mg total) by mouth 2 (two) times daily.  Return in about 3 months (around 12/02/2020) for TOC Huston Foley Just NP.    Rutherford Guys, MD Primary Care at [place], [place] [address] Ph.  [phone] Fax [phone]

## 2020-09-13 ENCOUNTER — Emergency Department (HOSPITAL_COMMUNITY)
Admission: EM | Admit: 2020-09-13 | Discharge: 2020-09-13 | Disposition: A | Discharge status: Home / Self Care | Payer: Medicare Other | Attending: Emergency Medicine | Admitting: Emergency Medicine

## 2020-09-13 ENCOUNTER — Other Ambulatory Visit: Payer: Self-pay

## 2020-09-13 ENCOUNTER — Emergency Department (HOSPITAL_COMMUNITY): Payer: Medicare Other

## 2020-09-13 ENCOUNTER — Encounter (HOSPITAL_COMMUNITY): Payer: Self-pay | Admitting: Emergency Medicine

## 2020-09-13 DIAGNOSIS — I5032 Chronic diastolic (congestive) heart failure: Secondary | ICD-10-CM | POA: Diagnosis not present

## 2020-09-13 DIAGNOSIS — M79671 Pain in right foot: Secondary | ICD-10-CM | POA: Insufficient documentation

## 2020-09-13 DIAGNOSIS — N183 Chronic kidney disease, stage 3 unspecified: Secondary | ICD-10-CM | POA: Diagnosis not present

## 2020-09-13 DIAGNOSIS — M79672 Pain in left foot: Secondary | ICD-10-CM | POA: Diagnosis not present

## 2020-09-13 DIAGNOSIS — Z85528 Personal history of other malignant neoplasm of kidney: Secondary | ICD-10-CM | POA: Diagnosis not present

## 2020-09-13 DIAGNOSIS — Z79899 Other long term (current) drug therapy: Secondary | ICD-10-CM | POA: Diagnosis not present

## 2020-09-13 DIAGNOSIS — Z7984 Long term (current) use of oral hypoglycemic drugs: Secondary | ICD-10-CM | POA: Insufficient documentation

## 2020-09-13 DIAGNOSIS — G8929 Other chronic pain: Secondary | ICD-10-CM | POA: Diagnosis not present

## 2020-09-13 DIAGNOSIS — E039 Hypothyroidism, unspecified: Secondary | ICD-10-CM | POA: Diagnosis not present

## 2020-09-13 DIAGNOSIS — M7989 Other specified soft tissue disorders: Secondary | ICD-10-CM | POA: Diagnosis not present

## 2020-09-13 DIAGNOSIS — I130 Hypertensive heart and chronic kidney disease with heart failure and stage 1 through stage 4 chronic kidney disease, or unspecified chronic kidney disease: Secondary | ICD-10-CM | POA: Insufficient documentation

## 2020-09-13 DIAGNOSIS — E1142 Type 2 diabetes mellitus with diabetic polyneuropathy: Secondary | ICD-10-CM | POA: Diagnosis not present

## 2020-09-13 DIAGNOSIS — R2241 Localized swelling, mass and lump, right lower limb: Secondary | ICD-10-CM | POA: Diagnosis not present

## 2020-09-13 DIAGNOSIS — M19071 Primary osteoarthritis, right ankle and foot: Secondary | ICD-10-CM | POA: Diagnosis not present

## 2020-09-13 DIAGNOSIS — F17210 Nicotine dependence, cigarettes, uncomplicated: Secondary | ICD-10-CM | POA: Diagnosis not present

## 2020-09-13 DIAGNOSIS — M7732 Calcaneal spur, left foot: Secondary | ICD-10-CM | POA: Diagnosis not present

## 2020-09-13 MED ORDER — ACETAMINOPHEN 325 MG PO TABS
650.0000 mg | ORAL_TABLET | Freq: Once | ORAL | Status: AC
  Administered 2020-09-13: 650 mg via ORAL
  Filled 2020-09-13: qty 2

## 2020-09-13 MED ORDER — DICLOFENAC SODIUM 1 % EX GEL: 2.0000 g | Freq: Three times a day (TID) | CUTANEOUS | 0 refills | Status: DC | PRN

## 2020-09-13 NOTE — ED Triage Notes (Signed)
Patient arrives to ED with complaints of right foot/toe pain x1 week. Pt states the pain is constant and gets worse at night and has been swelling. Pt states sit is now hard to walk on the foot due to pressure. Pt states that she went to a foot MD who dx her with bone spurs.

## 2020-09-13 NOTE — ED Notes (Signed)
Reviewed discharge instructions with patient. Follow-up care and medications reviewed. Patient verbalized understanding. Patient A&Ox4, VSS upon discharge. 

## 2020-09-13 NOTE — ED Provider Notes (Signed)
[facility] EMERGENCY DEPARTMENT Provider Note   CSN: [id number] Arrival date & time: 09/13/20  1730     History Chief Complaint  Patient presents with  . Foot Pain    Sherri Burns is a 84 y.o. female history of CHF, diabetes, GERD, hypertension, hyperlipidemia, CKD.  Patient presents today for bilateral foot pain ongoing since April 2021, worse over the last week.  Describes a burning sharp pain to the bottom of both of her feet this is constant worse with ambulation minimally improved with rest, does not radiate, she feels the pain is worse in her right foot compared to her left.  She says this is associated with swelling which is worse after ambulation.  She reports that she was seen by a podiatrist several months ago and was diagnosed with bone spurs but she feels pain is much worse than it was back then.  She denies any fever/chills, fall/injuries, abdominal pain, nausea/vomiting, numbness/tingling, weakness, skin break/wound or any additional concerns.  HPI     Past Medical History:  Diagnosis Date  . Abnormal EKG 02/07/2016   Inferolateral T wave inversion and ST depression.  . Acute calculous cholecystitis   . Anxiety   . Arthritis   . Chronic diastolic CHF (congestive heart failure) ([place]) 02/21/2018  . Colon polyps 11/2008   tubular adenoma  . Diabetes mellitus   . Esophageal problem    esophageal dilation  . GERD (gastroesophageal reflux disease)    + hpylori  . GI bleeding 01/2018  . Headache   . Hyperlipidemia   . Hypertension   . Hypothyroidism   . Ischemic colitis ([place])   . Renal cancer, right ([place]) 2010   s/p Rt nephrectomy by Dr. Rosana Hoes  . Renal insufficiency     Patient Active Problem List   Diagnosis Date Noted  . Lower GI bleed 05/31/2020  . Statin intolerance 11/12/2019  . GAD (generalized anxiety disorder) 08/07/2019  . Long term prescription benzodiazepine use 08/07/2019  . GI bleed 04/10/2019  . S/p nephrectomy  06/24/2018  . Chronic diastolic CHF (congestive heart failure) ([place]) 02/21/2018  . Acute lower GI bleeding 02/20/2018  . Hyperlipidemia   . Abnormal EKG 02/07/2016  . Type 2 diabetes mellitus with diabetic polyneuropathy, without long-term current use of insulin ([place]) 09/23/2012  . Essential hypertension   . Hypothyroidism   . CKD (chronic kidney disease), stage III [facility])     Past Surgical History:  Procedure Laterality Date  . ABDOMINAL HYSTERECTOMY  1978   partial  . CATARACT EXTRACTION    . CHOLECYSTECTOMY N/A 04/04/2016   Procedure: LAPAROSCOPIC CHOLECYSTECTOMY;  Surgeon: Autumn Messing III, MD;  Location: [facility];  Service: General;  Laterality: N/A;  . COLONOSCOPY    . ESOPHAGEAL DILATION  2016  . HERNIA REPAIR    . LAPAROSCOPIC CHOLECYSTECTOMY  04/04/2016  . NEPHRECTOMY Right 2010   Dr. Rosana Hoes, urology, due to renal cancer     OB History   No obstetric history on file.     Family History  Problem Relation Age of Onset  . Cancer Mother        colon, kidney cancer  . Cancer Father        throat cancer  . Cancer Sister   . Heart attack Brother     Social History   Tobacco Use  . Smoking status: Current Some Day Smoker    Packs/day: 0.25    Years: 55.00    Pack years: 13.75  Types: Cigarettes  . Smokeless tobacco: Never Used  . Tobacco comment: cut back amount still  trying to quit  Vaping Use  . Vaping Use: Never used  Substance Use Topics  . Alcohol use: No    Alcohol/week: 0.0 standard drinks  . Drug use: No    Home Medications Prior to Admission medications   Medication Sig Start Date End Date Taking? Authorizing Provider  acetaminophen (TYLENOL) 325 MG tablet Take 2 tablets (650 mg total) by mouth every 6 (six) hours as needed for mild pain (or Fever >/= 101). Patient taking differently: Take 325 mg by mouth every 6 (six) hours as needed for mild pain.  06/02/20  Yes Allie Bossier, MD  acetic acid 2 % otic solution Place 4 drops into both ears 3 (three)  times daily. 09/17/19  Yes Rutherford Guys, MD  brimonidine (ALPHAGAN) 0.2 % ophthalmic solution Place 1 drop into the left eye 2 (two) times daily as needed (Dry eyes).  10/27/19  Yes [provider]  cholecalciferol (VITAMIN D3) 25 MCG (1000 UNIT) tablet Take 1,000 Units by mouth daily.   Yes [provider]  clonazePAM (KLONOPIN) 1 MG tablet TAKE HALF TABLET BY MOUTH EVERY MORNING AND TAKE ONE TABLET BY MOUTH AT BEDTIME Patient taking differently: Take 0.5-1 mg by mouth See admin instructions. Take 1/2 tablet (0.5mg ) by mouth daily in the morning and take 1 tablet (1mg ) by mouth daily at bedtime. 05/09/20  Yes Rutherford Guys, MD  famotidine (PEPCID) 20 MG tablet Take 1 tablet (20 mg total) by mouth 2 (two) times daily. Patient taking differently: Take 20 mg by mouth daily.  09/01/20  Yes Rutherford Guys, MD  fluticasone [facility]) 50 MCG/ACT nasal spray Place 1-2 sprays into both nostrils daily. 06/17/20  Yes Rutherford Guys, MD  furosemide (LASIX) 40 MG tablet Take 0.5 tablets (20 mg total) by mouth daily. 08/08/20  Yes Rutherford Guys, MD  glipiZIDE (GLUCOTROL) 10 MG tablet Take 0.5 tablets (5 mg total) by mouth daily before breakfast. 06/16/20  Yes Rutherford Guys, MD  levothyroxine (SYNTHROID) 75 MCG tablet Take 1 tablet (75 mcg total) by mouth daily before breakfast. 08/18/20  Yes Rutherford Guys, MD  metoprolol tartrate (LOPRESSOR) 100 MG tablet Take 1 tablet (100 mg total) by mouth 2 (two) times daily. 06/16/20  Yes Rutherford Guys, MD  pantoprazole (PROTONIX) 20 MG tablet Take 20 mg by mouth daily.   Yes [provider]  polyethylene glycol (MIRALAX / GLYCOLAX) 17 g packet Take 17 g by mouth daily as needed for mild constipation. 06/02/20  Yes Allie Bossier, MD  vitamin E 100 UNIT capsule Take 100 Units by mouth daily.   Yes [provider]  amLODipine (NORVASC) 10 MG tablet TAKE 1 TABLET (10 MG TOTAL) BY MOUTH DAILY. Patient not taking: Reported on  09/13/2020 03/04/20   Rutherford Guys, MD  diclofenac Sodium (VOLTAREN) 1 % GEL Apply 2 g topically 3 (three) times daily as needed (for joint pain). 09/13/20   Nuala Alpha A, PA-C  ondansetron (ZOFRAN) 4 MG tablet Take 1 tablet (4 mg total) by mouth every 6 (six) hours as needed for nausea. Patient not taking: Reported on 09/13/2020 06/02/20   Allie Bossier, MD  omeprazole (PRILOSEC) 40 MG capsule TAKE 1 CAPSULE (40 MG TOTAL) BY MOUTH TWO (TWO) TIMES DAILY. 12/15/19   Thornton Park, MD    Allergies    Ace inhibitors, Codeine, Januvia [sitagliptin], Metformin and  related, Ciprocin-fluocin-procin [fluocinolone acetonide], Clonidine derivatives, Crestor [rosuvastatin calcium], Penicillins, Simvastatin, and Tessalon perles  Review of Systems   Review of Systems  Constitutional: Negative.  Negative for chills and fever.  Gastrointestinal: Negative.  Negative for abdominal pain, nausea and vomiting.  Musculoskeletal: Positive for arthralgias (Bilateral foot pain). Negative for back pain and myalgias.  Skin: Negative.  Negative for color change and wound.  Neurological: Negative.  Negative for weakness and numbness.     Physical Exam Updated Vital Signs BP 133/68 (BP Location: Left Arm)   Pulse 67   Temp 98.6 F (37 C) (Oral)   Resp 16   Ht 5\' 6"  (1.676 m)   Wt 79.4 kg   SpO2 99%   BMI 28.25 kg/m   Physical Exam Constitutional:      General: She is not in acute distress.    Appearance: Normal appearance. She is well-developed. She is not ill-appearing or diaphoretic.  HENT:     Head: Normocephalic and atraumatic.  Eyes:     General: Vision grossly intact. Gaze aligned appropriately.     Pupils: Pupils are equal, round, and reactive to light.  Neck:     Trachea: Trachea and phonation normal.  Cardiovascular:     Pulses:          Dorsalis pedis pulses are 1+ on the right side and 1+ on the left side.  Pulmonary:     Effort: Pulmonary effort is normal. No respiratory  distress.  Abdominal:     General: There is no distension.     Palpations: Abdomen is soft.     Tenderness: There is no abdominal tenderness. There is no guarding or rebound.  Musculoskeletal:        General: Normal range of motion.     Cervical back: Normal range of motion.     Right foot: No deformity.     Left foot: No deformity.  Feet:     Right foot:     Protective Sensation: 5 sites tested. 5 sites sensed.     Skin integrity: Skin integrity normal.     Left foot:     Protective Sensation: 5 sites tested. 5 sites sensed.     Skin integrity: Skin integrity normal.     Comments: Mild diffuse swelling bilateral feet.  Tenderness to the bilateral arches without overlying skin change. Skin:    General: Skin is warm and dry.  Neurological:     Mental Status: She is alert.     GCS: GCS eye subscore is 4. GCS verbal subscore is 5. GCS motor subscore is 6.     Comments: Speech is clear and goal oriented, follows commands Major Cranial nerves without deficit, no facial droop Moves extremities without ataxia, coordination intact  Psychiatric:        Behavior: Behavior normal.     ED Results / Procedures / Treatments   Labs (all labs ordered are listed, but only abnormal results are displayed) Labs Reviewed - No data to display  EKG None  Radiology DG Ankle Complete Left  Result Date: 09/13/2020 CLINICAL DATA:  Chronic pain EXAM: LEFT ANKLE COMPLETE - 3+ VIEW COMPARISON:  None. FINDINGS: No acute bony abnormality. Specifically, no fracture, subluxation, or dislocation. Joint spaces maintained. Small plantar calcaneal spur. Soft tissues are intact. IMPRESSION: No acute bony abnormality. Electronically Signed   By: Rolm Baptise M.D.   On: 09/13/2020 21:47   DG Ankle Complete Right  Result Date: 09/13/2020 CLINICAL DATA:  Right foot pain  x1 week. EXAM: RIGHT ANKLE - COMPLETE 3+ VIEW COMPARISON:  Apr 12, 2020 FINDINGS: There is no evidence of an acute fracture, dislocation, or  joint effusion. Mild to moderate severity degenerative changes are seen along the dorsal aspect of the mid right foot. Mild to moderate severity diffuse soft tissue swelling is seen. IMPRESSION: Mild to moderate severity soft tissue swelling without evidence of an acute fracture. Electronically Signed   By: Virgina Norfolk M.D.   On: 09/13/2020 21:46   DG Foot Complete Left  Result Date: 09/13/2020 CLINICAL DATA:  Foot pain x1 week EXAM: LEFT FOOT - COMPLETE 3+ VIEW COMPARISON:  None. FINDINGS: There is no evidence of fracture or dislocation. There is no evidence of arthropathy or other focal bone abnormality. Soft tissues are unremarkable. IMPRESSION: Negative. Electronically Signed   By: Virgina Norfolk M.D.   On: 09/13/2020 21:49   DG Foot Complete Right  Result Date: 09/13/2020 CLINICAL DATA:  Right foot pain x1 week. EXAM: RIGHT FOOT COMPLETE - 3+ VIEW COMPARISON:  None. FINDINGS: There is no evidence of fracture or dislocation. Mild to moderate severity degenerative changes seen along the dorsal aspect of the mid right foot. Mild to moderate severity soft tissue swelling is seen within the region of the right ankle. IMPRESSION: Degenerative changes, without evidence of and acute osseous abnormality. Electronically Signed   By: Virgina Norfolk M.D.   On: 09/13/2020 21:51    Procedures Procedures (including critical care time)  Medications Ordered in ED Medications  acetaminophen (TYLENOL) tablet 650 mg (650 mg Oral Given 09/13/20 2154)    ED Course  I have reviewed the triage vital signs and the nursing notes.  Pertinent labs & imaging results that were available during my care of the patient were reviewed by me and considered in my medical decision making (see chart for details).    MDM Rules/Calculators/A&P                         Additional history obtained from: 1. Nursing notes from this visit. -------------- DG Right Ankle:  IMPRESSION:  Mild to moderate severity  soft tissue swelling without evidence of  an acute fracture.   DG Right Foot:  IMPRESSION:  Degenerative changes, without evidence of and acute osseous  abnormality.   DG Left Ankle:  IMPRESSION:  No acute bony abnormality.   DG Left Foot:  IMPRESSION:  Negative.  - Patient without evidence of cellulitis, septic arthritis, DVT, compartment syndrome, neurovascular compromise or other emergent pathologies.  She will need follow-up with her podiatrist and PCP.  No indication for antibiotics or further work-up at this time.  Diclofenac gel prescribed for pain relief.  At this time there does not appear to be any evidence of an acute emergency medical condition and the patient appears stable for discharge with appropriate outpatient follow up. Diagnosis was discussed with patient who verbalizes understanding of care plan and is agreeable to discharge. I have discussed return precautions with patient who verbalizes understanding. Patient encouraged to follow-up with their PCP and podiatry. All questions answered.  Patient seen and evaluated by Dr. Eulis Foster during this visit who agrees with diclofenac gel and discharge.  Note: Portions of this report may have been transcribed using voice recognition software. Every effort was made to ensure accuracy; however, inadvertent computerized transcription errors may still be present. Final Clinical Impression(s) / ED Diagnoses Final diagnoses:  Bilateral foot pain    Rx / DC Orders ED Discharge  Orders         Ordered    diclofenac Sodium (VOLTAREN) 1 % GEL  3 times daily PRN        09/13/20 2204           Gari Crown 09/13/20 2205    Daleen Bo, MD 09/13/20 2352

## 2020-09-13 NOTE — ED Notes (Signed)
Bilateral feet swelling, more so on the R foot

## 2020-09-13 NOTE — Discharge Instructions (Addendum)
At this time there does not appear to be the presence of an emergent medical condition, however there is always the potential for conditions to change. Please read and follow the below instructions.  Please return to the Emergency Department immediately for any new or worsening symptoms. Please be sure to follow up with your Primary Care Provider within one week regarding your visit today; please call their office to schedule an appointment even if you are feeling better for a follow-up visit. Please call Dr. Earleen Newport, the podiatrist on your discharge paperwork for follow-up appointment regarding your foot pain. You may use the diclofenac cream as prescribed to help with your foot and ankle pain.  Go to the nearest Emergency Department immediately if: You have fever or chills Your foot is numb or tingling. Your foot or toes are swollen. Your foot or toes turn white or blue. You have warmth and redness along your foot. You have any new/concerning or worsening of symptoms  Please read the additional information packets attached to your discharge summary.  Do not take your medicine if  develop an itchy rash, swelling in your mouth or lips, or difficulty breathing; call 911 and seek immediate emergency medical attention if this occurs.  You may review your lab tests and imaging results in their entirety on your MyChart account.  Please discuss all results of fully with your primary care provider and other specialist at your follow-up visit.  Note: Portions of this text may have been transcribed using voice recognition software. Every effort was made to ensure accuracy; however, inadvertent computerized transcription errors may still be present.

## 2020-09-13 NOTE — ED Provider Notes (Signed)
  Face-to-face evaluation   History: She presents for evaluation of bilateral foot pain for 2 weeks, for she is using Biofreeze on, with partial relief.  Recently diagnosed with bone spurs by a podiatrist, in April 2021.  She has not seen a doctor since then.  No recent trauma.  Physical exam: Awake alert elderly female.  Both feet are swollen.  They are diffusely tender to palpation.  There are no gross deformities or proximal abnormal findings.  Medical screening examination/treatment/procedure(s) were conducted as a shared visit with non-physician practitioner(s) and myself.  I personally evaluated the patient during the encounter    Daleen Bo, MD 09/13/20 2352

## 2020-09-14 ENCOUNTER — Telehealth: Payer: Self-pay | Admitting: *Deleted

## 2020-09-14 NOTE — Telephone Encounter (Signed)
Please schedule a hospital follow up

## 2020-09-14 NOTE — Telephone Encounter (Signed)
Pt called regarding which pharmacy Rx was e-scribed to.  RNCM reviewed chart to access After Visit Summary and found that Rx was printed.  Pt states she usually gets her Rx delivered and is unable to take printed Rx to pharmacy.  RNCM called in Rx as written.  Pharmacy will retrieve printed Rx on delivery of Rx.  Advised pt to have printed Rx available when pharmacy arrives.

## 2020-09-26 ENCOUNTER — Other Ambulatory Visit: Payer: Self-pay

## 2020-09-26 ENCOUNTER — Other Ambulatory Visit: Payer: Self-pay | Admitting: Podiatry

## 2020-09-26 ENCOUNTER — Ambulatory Visit: Payer: Medicare Other | Admitting: Podiatry

## 2020-09-26 DIAGNOSIS — M1000 Idiopathic gout, unspecified site: Secondary | ICD-10-CM

## 2020-09-26 DIAGNOSIS — M779 Enthesopathy, unspecified: Secondary | ICD-10-CM

## 2020-09-26 DIAGNOSIS — M79671 Pain in right foot: Secondary | ICD-10-CM

## 2020-09-26 NOTE — Patient Instructions (Signed)
 Gout  Gout is painful swelling of your joints. Gout is a type of arthritis. It is caused by having too much uric acid in your body. Uric acid is a chemical that is made when your body breaks down substances called purines. If your body has too much uric acid, sharp crystals can form and build up in your joints. This causes pain and swelling. Gout attacks can happen quickly and be very painful (acute gout). Over time, the attacks can affect more joints and happen more often (chronic gout). What are the causes?  Too much uric acid in your blood. This can happen because: ? Your kidneys do not remove enough uric acid from your blood. ? Your body makes too much uric acid. ? You eat too many foods that are high in purines. These foods include organ meats, some seafood, and beer.  Trauma or stress. What increases the risk?  Having a family history of gout.  Being female and middle-aged.  Being female and having gone through menopause.  Being very overweight (obese).  Drinking alcohol, especially beer.  Not having enough water in the body (being dehydrated).  Losing weight too quickly.  Having an organ transplant.  Having lead poisoning.  Taking certain medicines.  Having kidney disease.  Having a skin condition called psoriasis. What are the signs or symptoms? An attack of acute gout usually happens in just one joint. The most common place is the big toe. Attacks often start at night. Other joints that may be affected include joints of the feet, ankle, knee, fingers, wrist, or elbow. Symptoms of an attack may include:  Very bad pain.  Warmth.  Swelling.  Stiffness.  Shiny, red, or purple skin.  Tenderness. The affected joint may be very painful to touch.  Chills and fever. Chronic gout may cause symptoms more often. More joints may be involved. You may also have white or yellow lumps (tophi) on your hands or feet or in other areas near your joints. How is this  treated?  Treatment for this condition has two phases: treating an acute attack and preventing future attacks.  Acute gout treatment may include: ? NSAIDs. ? Steroids. These are taken by mouth or injected into a joint. ? Colchicine. This medicine relieves pain and swelling. It can be given by mouth or through an IV tube.  Preventive treatment may include: ? Taking small doses of NSAIDs or colchicine daily. ? Using a medicine that reduces uric acid levels in your blood. ? Making changes to your diet. You may need to see a food expert (dietitian) about what to eat and drink to prevent gout. Follow these instructions at home: During a gout attack   If told, put ice on the painful area: ? Put ice in a plastic bag. ? Place a towel between your skin and the bag. ? Leave the ice on for 20 minutes, 2-3 times a day.  Raise (elevate) the painful joint above the level of your heart as often as you can.  Rest the joint as much as possible. If the joint is in your leg, you may be given crutches.  Follow instructions from your doctor about what you cannot eat or drink. Avoiding future gout attacks  Eat a low-purine diet. Avoid foods and drinks such as: ? Liver. ? Kidney. ? Anchovies. ? Asparagus. ? Herring. ? Mushrooms. ? Mussels. ? Beer.  Stay at a healthy weight. If you want to lose weight, talk with your doctor. Do not lose   weight too fast.  Start or continue an exercise plan as told by your doctor. Eating and drinking  Drink enough fluids to keep your pee (urine) pale yellow.  If you drink alcohol: ? Limit how much you use to:  0-1 drink a day for women.  0-2 drinks a day for men. ? Be aware of how much alcohol is in your drink. In the [place] one drink equals one 12 oz bottle of beer (355 mL), one 5 oz glass of wine (148 mL), or one 1 oz glass of hard liquor (44 mL). General instructions  Take over-the-counter and prescription medicines only as told by your doctor.  Do  not drive or use heavy machinery while taking prescription pain medicine.  Return to your normal activities as told by your doctor. Ask your doctor what activities are safe for you.  Keep all follow-up visits as told by your doctor. This is important. Contact a doctor if:  You have another gout attack.  You still have symptoms of a gout attack after 10 days of treatment.  You have problems (side effects) because of your medicines.  You have chills or a fever.  You have burning pain when you pee (urinate).  You have pain in your lower back or belly. Get help right away if:  You have very bad pain.  Your pain cannot be controlled.  You cannot pee. Summary  Gout is painful swelling of the joints.  The most common site of pain is the big toe, but it can affect other joints.  Medicines and avoiding some foods can help to prevent and treat gout attacks. This information is not intended to replace advice given to you by your health care provider. Make sure you discuss any questions you have with your health care provider. Document Revised: 06/04/2018 Document Reviewed: 06/04/2018 Elsevier Patient Education  2020 Elsevier Inc.  

## 2020-09-27 LAB — COMPREHENSIVE METABOLIC PANEL
ALT: 14 IU/L (ref 0–32)
AST: 16 IU/L (ref 0–40)
Albumin/Globulin Ratio: 1.6 (ref 1.2–2.2)
Albumin: 4.9 g/dL — ABNORMAL HIGH (ref 3.6–4.6)
Alkaline Phosphatase: 73 IU/L (ref 44–121)
BUN/Creatinine Ratio: 17 (ref 12–28)
BUN: 22 mg/dL (ref 8–27)
Bilirubin Total: 0.3 mg/dL (ref 0.0–1.2)
CO2: 24 mmol/L (ref 20–29)
Calcium: 10.2 mg/dL (ref 8.7–10.3)
Chloride: 102 mmol/L (ref 96–106)
Creatinine, Ser: 1.32 mg/dL — ABNORMAL HIGH (ref 0.57–1.00)
GFR calc Af Amer: 43 mL/min/{1.73_m2} — ABNORMAL LOW (ref >59)
GFR calc non Af Amer: 38 mL/min/{1.73_m2} — ABNORMAL LOW (ref >59)
Globulin, Total: 3.1 g/dL (ref 1.5–4.5)
Glucose: 78 mg/dL (ref 65–99)
Potassium: 4.6 mmol/L (ref 3.5–5.2)
Sodium: 143 mmol/L (ref 134–144)
Total Protein: 8 g/dL (ref 6.0–8.5)

## 2020-09-27 LAB — URIC ACID: Uric Acid: 7.6 mg/dL (ref 3.1–7.9)

## 2020-09-27 LAB — C-REACTIVE PROTEIN: CRP: 3 mg/L (ref 0–10)

## 2020-09-27 LAB — SEDIMENTATION RATE: Sed Rate: 8 mm/hr (ref 0–40)

## 2020-09-30 NOTE — Progress Notes (Signed)
Subjective: 84 year old female presents the office today for evaluation of right foot pain.  She states that she went to the emergency room because the pain was getting worse with her foot and feels dependent for previously I saw her.  She points on the big toe joint where she got the majority discomfort as well as swelling.  She reviewed the x-rays performed and she does not want new x-rays today.  She denies recent injury or falls.  Her symptoms are improved compared to when she was in the emergency department. Denies any systemic complaints such as fevers, chills, nausea, vomiting. No acute changes since last appointment, and no other complaints at this time.   Objective: AAO x3, NAD DP/PT pulses palpable bilaterally, CRT less than 3 seconds There is mild tenderness diffusely to the foot there is very tender slight serous pain on the first MPJ with localized edema.  There is discomfort with first MPJ range of motion.  No pinpoint tenderness.  No significant warmth.  MMT 4/5. No pain with calf compression, swelling, warmth, erythema  Assessment: Right foot pain, capsulitis, concern for gout  Plan: -All treatment options discussed with the patient including all alternatives, risks, complications.  -She did not want to have new x-rays performed today.  I offered steroid injection one-to-one up on this.  Dispensed a surgical shoe pain encourage elevation.  We will check blood work including ESR, CRP, uric acid, CMP. -Patient encouraged to call the office with any questions, concerns, change in symptoms.   Trula Slade DPM

## 2020-10-03 ENCOUNTER — Ambulatory Visit: Payer: Medicare Other | Admitting: Podiatry

## 2020-10-03 ENCOUNTER — Other Ambulatory Visit: Payer: Self-pay

## 2020-10-03 DIAGNOSIS — M779 Enthesopathy, unspecified: Secondary | ICD-10-CM

## 2020-10-03 DIAGNOSIS — G8929 Other chronic pain: Secondary | ICD-10-CM

## 2020-10-03 DIAGNOSIS — M79671 Pain in right foot: Secondary | ICD-10-CM | POA: Diagnosis not present

## 2020-10-03 DIAGNOSIS — M1000 Idiopathic gout, unspecified site: Secondary | ICD-10-CM | POA: Diagnosis not present

## 2020-10-03 MED ORDER — ALLOPURINOL 100 MG PO TABS
100.0000 mg | ORAL_TABLET | Freq: Every day | ORAL | 0 refills | Status: DC
Start: 2020-10-03 — End: 2020-11-11

## 2020-10-03 NOTE — Patient Instructions (Addendum)
Please get blood done in about 3 weeks (prior to coming if for your next appointment)  Allopurinol tablets What is this medicine? ALLOPURINOL (al oh PURE i nole) reduces the amount of uric acid the body makes. It is used to treat the symptoms of gout. It is also used to treat or prevent high uric acid levels that occur as a result of certain types of chemotherapy. This medicine may also help patients who frequently have kidney stones. This medicine may be used for other purposes; ask your health care provider or pharmacist if you have questions. COMMON BRAND NAME(S): Zyloprim What should I tell my health care provider before I take this medicine? They need to know if you have any of these conditions:  kidney disease  liver disease  an unusual or allergic reaction to allopurinol, other medicines, foods, dyes, or preservatives  pregnant or trying to get pregnant  breast feeding How should I use this medicine? Take this medicine by mouth with a glass of water. Follow the directions on the prescription label. If this medicine upsets your stomach, take it with food or milk. Take your doses at regular intervals. Do not take your medicine more often than directed. Talk to your pediatrician regarding the use of this medicine in children. Special care may be needed. While this drug may be prescribed for children as young as 6 years for selected conditions, precautions do apply. Overdosage: If you think you have taken too much of this medicine contact a poison control center or emergency room at once. NOTE: This medicine is only for you. Do not share this medicine with others. What if I miss a dose? If you miss a dose, take it as soon as you can. If it is almost time for your next dose, take only that dose. Do not take double or extra doses. What may interact with this medicine? Do not take this medicine with the following medication:  didanosine, ddI This medicine may also interact with the  following medications:  certain antibiotics like amoxicillin, ampicillin  certain medicines for cancer  certain medicines for immunosuppression like azathioprine, cyclosporine, mercaptopurine  chlorpropamide  probenecid  thiazide diuretics, like hydrochlorothiazide  sulfinpyrazone  warfarin This list may not describe all possible interactions. Give your health care provider a list of all the medicines, herbs, non-prescription drugs, or dietary supplements you use. Also tell them if you smoke, drink alcohol, or use illegal drugs. Some items may interact with your medicine. What should I watch for while using this medicine? Visit your doctor or healthcare provider for regular checks on your progress. If you are taking this medicine to treat gout, you may not have less frequent attacks at first. Keep taking your medicine regularly and the attacks should get better within 2 to 6 weeks. Drink plenty of water (10 to 12 full glasses a day) while you are taking this medicine. This will help to reduce stomach upset and reduce the risk of getting gout or kidney stones. Call your doctor or healthcare provider at once if you get a skin rash together with chills, fever, sore throat, or nausea and vomiting, if you have blood in your urine, or difficulty passing urine. This medicine may cause serious skin reactions. They can happen weeks to months after starting the medicine. Contact your healthcare provider right away if you notice fevers or flu-like symptoms with a rash. The rash may be red or purple and then turn into blisters or peeling of the skin. Or, you  might notice a red rash with swelling of the face, lips or lymph nodes in your neck or under your arms. Do not take vitamin C without asking your doctor or healthcare provider. Too much vitamin C can increase the chance of getting kidney stones. You may get drowsy or dizzy. Do not drive, use machinery, or do anything that needs mental alertness until  you know how this drug affects you. Do not stand or sit up quickly, especially if you are an older patient. This reduces the risk of dizzy or fainting spells. Alcohol can make you more drowsy and dizzy. Alcohol can also increase the chance of stomach problems and increase the amount of uric acid in your blood. Avoid alcoholic drinks. What side effects may I notice from receiving this medicine? Side effects that you should report to your doctor or health care professional as soon as possible:  allergic reactions like skin rash, itching or hives, swelling of the face, lips, or tongue  breathing problems  joint pain  muscle pain  rash, fever, and swollen lymph nodes  redness, blistering, peeling, or loosening of the skin, including inside the mouth  signs and symptoms of infection like fever or chills; cough; sore throat  signs and symptoms of kidney injury like trouble passing urine or change in the amount of urine, flank pain  tingling, numbness in the hands or feet  unusual bleeding or bruising  unusually weak or tired Side effects that usually do not require medical attention (report to your doctor or health care professional if they continue or are bothersome):  changes in taste  diarrhea  drowsiness  headache  nausea, vomiting  stomach upset This list may not describe all possible side effects. Call your doctor for medical advice about side effects. You may report side effects to FDA at 1-800-FDA-1088. Where should I keep my medicine? Keep out of the reach of children. Store at room temperature between 15 and 25 degrees C (59 and 77 degrees F). Protect from light and moisture. Throw away any unused medicine after the expiration date. NOTE: This sheet is a summary. It may not cover all possible information. If you have questions about this medicine, talk to your doctor, pharmacist, or health care provider.  2020 Elsevier/Gold Standard (2019-02-03 09:41:46)

## 2020-10-05 NOTE — Progress Notes (Signed)
Subjective: 84 year old female presents the office today for evaluation of right foot pain.  She states that she still having some discomfort she points on the first MPJ where she gets majority discomfort.  She states that she gets generalized discomfort in her foot as well but the majority discomfort is localized to the swollen area.  She denies recent injury or fall since I last saw her no other changes. Denies any systemic complaints such as fevers, chills, nausea, vomiting. No acute changes since last appointment, and no other complaints at this time.   Objective: AAO x3, NAD DP/PT pulses palpable bilaterally, CRT less than 3 seconds There is continuation of swelling as well as discomfort of the right first MPJ.  There is pain with the first MPJ range of motion.  Minimal warmth the first MPJ but there is no erythema.  There is no open lesions.  No areas of fluctuation capitation.  No other areas of discomfort identified today. No pain with calf compression, swelling, warmth, erythema  Assessment: Right first IPJ capsulitis, likely gout  Plan: -All treatment options discussed with the patient including all alternatives, risks, complications.  -Reviewed blood work with her.  Although uric acid levels within normal limits in the upper limits.  I still think that she had likely gout attack which precipitated the discomfort and this is residual.  Today a steroid injection was performed.  Skin was prepped with Betadine, alcohol mixture of mixture of 1 cc Kenalog 10, 0.5 cc of Marcaine plain, 0.5 cc of lidocaine plain was infiltrated into the area maximal tenderness on the first MPJ without complications.  Postinjection care discussed. -Start allopurinol 100 mg -Recheck blood work in about 3 weeks including uric acid level as well as BMP -Follow-up with me in 4 weeks or sooner if needed.  Trula Slade DPM   -Patient encouraged to call the office with any questions, concerns, change in  symptoms.

## 2020-10-25 ENCOUNTER — Telehealth: Payer: Self-pay | Admitting: *Deleted

## 2020-10-25 NOTE — Telephone Encounter (Signed)
Schedule AWV.  

## 2020-10-28 ENCOUNTER — Other Ambulatory Visit: Payer: Self-pay | Admitting: Podiatry

## 2020-10-28 DIAGNOSIS — M1000 Idiopathic gout, unspecified site: Secondary | ICD-10-CM | POA: Diagnosis not present

## 2020-10-29 LAB — BASIC METABOLIC PANEL
BUN/Creatinine Ratio: 12 (ref 12–28)
BUN: 14 mg/dL (ref 8–27)
CO2: 23 mmol/L (ref 20–29)
Calcium: 10.1 mg/dL (ref 8.7–10.3)
Chloride: 102 mmol/L (ref 96–106)
Creatinine, Ser: 1.17 mg/dL — ABNORMAL HIGH (ref 0.57–1.00)
GFR calc Af Amer: 50 mL/min/{1.73_m2} — ABNORMAL LOW (ref >59)
GFR calc non Af Amer: 43 mL/min/{1.73_m2} — ABNORMAL LOW (ref >59)
Glucose: 125 mg/dL — ABNORMAL HIGH (ref 65–99)
Potassium: 4.7 mmol/L (ref 3.5–5.2)
Sodium: 140 mmol/L (ref 134–144)

## 2020-10-29 LAB — URIC ACID: Uric Acid: 7.7 mg/dL (ref 3.1–7.9)

## 2020-11-01 ENCOUNTER — Other Ambulatory Visit: Payer: Self-pay

## 2020-11-01 ENCOUNTER — Ambulatory Visit: Payer: Medicare Other | Admitting: Podiatry

## 2020-11-01 DIAGNOSIS — M779 Enthesopathy, unspecified: Secondary | ICD-10-CM

## 2020-11-01 DIAGNOSIS — G8929 Other chronic pain: Secondary | ICD-10-CM

## 2020-11-01 DIAGNOSIS — M1000 Idiopathic gout, unspecified site: Secondary | ICD-10-CM

## 2020-11-01 DIAGNOSIS — M79671 Pain in right foot: Secondary | ICD-10-CM | POA: Diagnosis not present

## 2020-11-01 MED ORDER — TRIAMCINOLONE ACETONIDE 10 MG/ML IJ SUSP
10.0000 mg | Freq: Once | INTRAMUSCULAR | Status: AC
  Administered 2020-11-01: 10 mg

## 2020-11-07 NOTE — Progress Notes (Signed)
Subjective: 84 year old female presents the office today for follow-up evaluation of right capsulitis, gout.  She has injection was helpful but she points on the sinus tarsi which is the majority discomfort.  She is current taking allopurinol 100 mg.  Overall she is feeling better but still in some discomfort.  No recent injury or trauma. Denies any systemic complaints such as fevers, chills, nausea, vomiting. No acute changes since last appointment, and no other complaints at this time.   Objective: AAO x3, NAD DP/PT pulses palpable bilaterally, CRT less than 3 seconds This time minimal discomfort the right first MPJ.  Moderate discomfort on sinus tarsi to lateral aspect of foot and minimal edema.  There is no pain in the ankle joint.  No area of pinpoint tenderness.  Flexor, extensor tendons appear to be intact.  MMT 5/5. No pain with calf compression, swelling, warmth, erythema  Assessment: 84 year old female with capsulitis, likely gout  Plan: -All treatment options discussed with the patient including all alternatives, risks, complications.  -Sterile injection performed to the sinus tarsi on the right side today.  See procedure note below -Can use Biofreeze as needed. -Recheck blood work -Continue supportive shoes -Patient encouraged to call the office with any questions, concerns, change in symptoms.   Procedure: Injection intermediate joint Discussed alternatives, risks, complications and verbal consent was obtained.  Location: Right sinus tarsi, lateral approach Skin Prep: Betadine. Injectate: 0.5cc 0.5% marcaine plain, 0.5 cc 2% lidocaine plain and, 1 cc kenalog 10. Disposition: Patient tolerated procedure well. Injection site dressed with a band-aid.  Post-injection care was discussed and return precautions discussed.   Return if symptoms worsen or fail to improve.  Trula Slade DPM

## 2020-11-11 ENCOUNTER — Telehealth: Payer: Self-pay | Admitting: *Deleted

## 2020-11-11 ENCOUNTER — Other Ambulatory Visit: Payer: Self-pay | Admitting: Podiatry

## 2020-11-11 MED ORDER — ALLOPURINOL 100 MG PO TABS: 100.0000 mg | ORAL_TABLET | Freq: Every day | ORAL | 0 refills | Status: AC

## 2020-11-11 NOTE — Telephone Encounter (Signed)
-----   Message from Trula Slade, DPM sent at 11/11/2020 12:25 PM EST ----- I have sent allopurinol to her pharmacy.  ----- Message ----- From: Viviana Simpler, [facility] Sent: 11/11/2020  11:49 AM EST To: Trula Slade, DPM  Hey I called patient yesterday and she stated that she is better but does have some swelling and a little redness and would like another refill sent into the [facility]. Please advise Sherri Burns

## 2020-11-11 NOTE — Telephone Encounter (Signed)
-----   Message from Trula Slade, DPM sent at 11/09/2020  4:22 PM EST ----- [name]- can you please let her know that her kidney function is stable. Her uric acid level is the same as before. Can you see how she was doing? Thanks!

## 2020-11-11 NOTE — Telephone Encounter (Signed)
Called and spoke with the patient and relayed the message per Dr Jacqualyn Posey. [name]

## 2020-11-11 NOTE — Telephone Encounter (Signed)
Called and spoke with the patient and relayed the message per Dr Jacqualyn Posey and patient stated that she was doing ok but the foot was still swelling and not as red but would like a refill sent to [facility] and I stated that I would relay the message to Dr Jacqualyn Posey. [name]

## 2020-11-14 ENCOUNTER — Other Ambulatory Visit: Payer: Self-pay

## 2020-11-14 MED ORDER — AMLODIPINE BESYLATE 10 MG PO TABS: 10.0000 mg | ORAL_TABLET | Freq: Every day | ORAL | 1 refills | Status: DC

## 2020-12-05 ENCOUNTER — Encounter: Payer: Medicare Other | Admitting: Family Medicine

## 2020-12-06 ENCOUNTER — Other Ambulatory Visit: Payer: Self-pay

## 2020-12-06 ENCOUNTER — Encounter: Payer: Self-pay | Admitting: Family Medicine

## 2020-12-06 ENCOUNTER — Ambulatory Visit (INDEPENDENT_AMBULATORY_CARE_PROVIDER_SITE_OTHER): Payer: Medicare Other | Admitting: Family Medicine

## 2020-12-06 VITALS — BP 157/80 | HR 79 | Temp 97.4°F | Ht 66.0 in | Wt 175.0 lb

## 2020-12-06 DIAGNOSIS — E1142 Type 2 diabetes mellitus with diabetic polyneuropathy: Secondary | ICD-10-CM

## 2020-12-06 DIAGNOSIS — L89610 Pressure ulcer of right heel, unstageable: Secondary | ICD-10-CM

## 2020-12-06 DIAGNOSIS — N644 Mastodynia: Secondary | ICD-10-CM

## 2020-12-06 DIAGNOSIS — K219 Gastro-esophageal reflux disease without esophagitis: Secondary | ICD-10-CM | POA: Diagnosis not present

## 2020-12-06 DIAGNOSIS — I5032 Chronic diastolic (congestive) heart failure: Secondary | ICD-10-CM

## 2020-12-06 DIAGNOSIS — R309 Painful micturition, unspecified: Secondary | ICD-10-CM

## 2020-12-06 DIAGNOSIS — I10 Essential (primary) hypertension: Secondary | ICD-10-CM

## 2020-12-06 DIAGNOSIS — E038 Other specified hypothyroidism: Secondary | ICD-10-CM

## 2020-12-06 DIAGNOSIS — N1831 Chronic kidney disease, stage 3a: Secondary | ICD-10-CM | POA: Diagnosis not present

## 2020-12-06 DIAGNOSIS — E559 Vitamin D deficiency, unspecified: Secondary | ICD-10-CM

## 2020-12-06 LAB — POCT URINALYSIS DIP (MANUAL ENTRY)
Bilirubin, UA: NEGATIVE
Glucose, UA: NEGATIVE mg/dL
Ketones, POC UA: NEGATIVE mg/dL
Leukocytes, UA: NEGATIVE
Nitrite, UA: NEGATIVE
Protein Ur, POC: NEGATIVE mg/dL
Spec Grav, UA: 1.015 (ref 1.010–1.025)
Urobilinogen, UA: 0.2 E.U./dL
pH, UA: 7 (ref 5.0–8.0)

## 2020-12-06 MED ORDER — FAMOTIDINE 20 MG PO TABS: 20.0000 mg | ORAL_TABLET | Freq: Two times a day (BID) | ORAL | 2 refills | Status: DC

## 2020-12-06 MED ORDER — PANTOPRAZOLE SODIUM 20 MG PO TBEC: 20.0000 mg | DELAYED_RELEASE_TABLET | Freq: Every day | ORAL | 3 refills | Status: DC

## 2020-12-06 MED ORDER — AMLODIPINE BESYLATE 5 MG PO TABS: 5.0000 mg | ORAL_TABLET | Freq: Every day | ORAL | 3 refills | Status: DC

## 2020-12-06 NOTE — Progress Notes (Signed)
1/11/20224:13 PM  [name] Nov 11, 1936, 85 y.o., female [id number]  Chief Complaint  Patient presents with  . Bloated    With pain    . Vaginal Pain    Urinary burning and pain   . stomach pain and burning esophagus and stomach     Ran out of pepcid which helps , takes 4 times daily - dry mouth and congestion in am    HPI:   Patient is a 85 y.o. female with past medical history significant for DM2, CKD3, s/p nephrectomy, HTN, anxietyon long term bzd, hypothyroidism, HLPwith statin intolerancewho presents today for toc,  Patient Care Team: Tymere Depuy, Laurita Quint, FNP as PCP - General (Family Medicine) Troy Sine, MD (Cardiology) Richmond Campbell, MD (Gastroenterology) Barbaraann Rondo, MD (Obstetrics and Gynecology) Myrlene Broker, MD (Urology) Marygrace Drought, MD as Consulting Physician (Ophthalmology) Podiatry: Dr. Jacqualyn Posey  DM: Glipizide Before xmas had cataracts removed  Metoprolol and furosemide BP Readings from Last 3 Encounters:  12/06/20 (!) 157/80  09/13/20 133/68  09/01/20 135/77    Gout:Allopurinol  GERD: famotidine 4 times per day  Hypothyroid: recheck TSH for levothyroxine dose: 65mcgs daily  Referred to GYN for vaginal pain  Takes Vitamin D supplements 167mcgs Last vitamin D Lab Results  Component Value Date   VD25OH 23.9 (L) 06/10/2018     Health Maintenance  Topic Date Due  . COVID-19 Vaccine (3 - Booster for Pfizer series) 08/25/2020  . OPHTHALMOLOGY EXAM  10/06/2020  . TETANUS/TDAP  05/09/2021 (Originally 08/25/2017)  . HEMOGLOBIN A1C  02/05/2021  . FOOT EXAM  03/24/2021  . URINE MICROALBUMIN  03/24/2021  . INFLUENZA VACCINE  Completed  . DEXA SCAN  Completed  . PNA vac Low Risk Adult  Completed      [facility] screen [facility] 2/9 12/06/2020 09/01/2020 05/09/2020  Decreased Interest 0 0 0  Down, Depressed, Hopeless 0 0 2  PHQ - 2 Score 0 0 2  Altered sleeping - - 2  Tired, decreased energy - - 2  Change in appetite - - 0   Feeling bad or failure about yourself  - - 0  Trouble concentrating - - 0  Moving slowly or fidgety/restless - - 0  Suicidal thoughts - - -  PHQ-9 Score - - 6  Difficult doing work/chores - - Not difficult at all  Some recent data might be hidden    Fall Risk  12/06/2020 09/01/2020 06/16/2020 05/09/2020 04/11/2020  Falls in the past year? 0 0 0 0 0  Number falls in past yr: 0 0 0 0 0  Injury with Fall? 0 0 0 0 0  Risk for fall due to : - No Fall Risks - - -  Follow up Falls evaluation completed Falls evaluation completed Falls evaluation completed Falls evaluation completed -     Allergies  Allergen Reactions  . Ace Inhibitors Swelling    See 02/16/19   . Codeine Other (See Comments)    hallucinations  . Januvia [Sitagliptin] Diarrhea  . Metformin And Related     Upset stomach  . Ciprocin-Fluocin-Procin [Fluocinolone Acetonide] Other (See Comments)    Unknown reaction  . Clonidine Derivatives Other (See Comments)    Dry mouth  . Crestor [Rosuvastatin Calcium] Other (See Comments)    cramps  . Penicillins Other (See Comments)    Has patient had a PCN reaction causing immediate rash, facial/tongue/throat swelling, SOB or lightheadedness with hypotension: no Has patient had a PCN reaction causing severe rash involving  mucus membranes or skin necrosis: no Has patient had a PCN reaction that required hospitalization : no Has patient had a PCN reaction occurring within the last 10 years: no -Hallucinates If all of the above answers are "NO", then may proceed with Cephalosporin use.   . Simvastatin Other (See Comments)    Upset stomach   . Tessalon Perles Other (See Comments)    Gi upset     Prior to Admission medications   Medication Sig Start Date End Date Taking? Authorizing Provider  acetaminophen (TYLENOL) 325 MG tablet Take 2 tablets (650 mg total) by mouth every 6 (six) hours as needed for mild pain (or Fever >/= 101). Patient taking differently: Take 325 mg by mouth  every 6 (six) hours as needed for mild pain.  06/02/20   Allie Bossier, MD  acetic acid 2 % otic solution Place 4 drops into both ears 3 (three) times daily. 09/17/19   Jacelyn Pi, Lilia Argue, MD  allopurinol (ZYLOPRIM) 100 MG tablet Take 1 tablet (100 mg total) by mouth daily. 11/11/20   Trula Slade, DPM  amLODipine (NORVASC) 10 MG tablet Take 1 tablet (10 mg total) by mouth daily. 11/14/20   Kaston Faughn, Laurita Quint, FNP  brimonidine (ALPHAGAN) 0.2 % ophthalmic solution Place 1 drop into the left eye 2 (two) times daily as needed (Dry eyes).  10/27/19   [provider]  cholecalciferol (VITAMIN D3) 25 MCG (1000 UNIT) tablet Take 1,000 Units by mouth daily.    [provider]  clonazePAM (KLONOPIN) 1 MG tablet TAKE HALF TABLET BY MOUTH EVERY MORNING AND TAKE ONE TABLET BY MOUTH AT BEDTIME Patient taking differently: Take 0.5-1 mg by mouth See admin instructions. Take 1/2 tablet (0.5mg ) by mouth daily in the morning and take 1 tablet (1mg ) by mouth daily at bedtime. 05/09/20   Jacelyn Pi, Lilia Argue, MD  diclofenac Sodium (VOLTAREN) 1 % GEL Apply 2 g topically 3 (three) times daily as needed (for joint pain). 09/13/20   Nuala Alpha A, PA-C  famotidine (PEPCID) 20 MG tablet Take 1 tablet (20 mg total) by mouth 2 (two) times daily. Patient taking differently: Take 20 mg by mouth daily.  09/01/20   Jacelyn Pi, Lilia Argue, MD  fluticasone [facility]) 50 MCG/ACT nasal spray Place 1-2 sprays into both nostrils daily. 06/17/20   Daleen Squibb, MD  furosemide (LASIX) 40 MG tablet Take 0.5 tablets (20 mg total) by mouth daily. 08/08/20   Jacelyn Pi, Lilia Argue, MD  glipiZIDE (GLUCOTROL) 10 MG tablet Take 0.5 tablets (5 mg total) by mouth daily before breakfast. 06/16/20   Jacelyn Pi, Lilia Argue, MD  levothyroxine (SYNTHROID) 75 MCG tablet Take 1 tablet (75 mcg total) by mouth daily before breakfast. 08/18/20   Jacelyn Pi, Lilia Argue, MD  metoprolol tartrate (LOPRESSOR) 100 MG tablet Take 1 tablet  (100 mg total) by mouth 2 (two) times daily. 06/16/20   Daleen Squibb, MD  ondansetron (ZOFRAN) 4 MG tablet Take 1 tablet (4 mg total) by mouth every 6 (six) hours as needed for nausea. Patient not taking: Reported on 09/13/2020 06/02/20   Allie Bossier, MD  pantoprazole (PROTONIX) 20 MG tablet Take 20 mg by mouth daily.    [provider]  polyethylene glycol (MIRALAX / GLYCOLAX) 17 g packet Take 17 g by mouth daily as needed for mild constipation. 06/02/20   Allie Bossier, MD  vitamin E 100 UNIT capsule Take 100 Units by mouth daily.  [provider]    Past Medical History:  Diagnosis Date  . Abnormal EKG 02/07/2016   Inferolateral T wave inversion and ST depression.  . Acute calculous cholecystitis   . Anxiety   . Arthritis   . Chronic diastolic CHF (congestive heart failure) ([place]) 02/21/2018  . Colon polyps 11/2008   tubular adenoma  . Diabetes mellitus   . Esophageal problem    esophageal dilation  . GERD (gastroesophageal reflux disease)    + hpylori  . GI bleeding 01/2018  . Headache   . Hyperlipidemia   . Hypertension   . Hypothyroidism   . Ischemic colitis ([place])   . Renal cancer, right ([place]) 2010   s/p Rt nephrectomy by Dr. Rosana Hoes  . Renal insufficiency     Past Surgical History:  Procedure Laterality Date  . ABDOMINAL HYSTERECTOMY  1978   partial  . CATARACT EXTRACTION    . CHOLECYSTECTOMY N/A 04/04/2016   Procedure: LAPAROSCOPIC CHOLECYSTECTOMY;  Surgeon: Autumn Messing III, MD;  Location: [facility];  Service: General;  Laterality: N/A;  . COLONOSCOPY    . ESOPHAGEAL DILATION  2016  . HERNIA REPAIR    . LAPAROSCOPIC CHOLECYSTECTOMY  04/04/2016  . NEPHRECTOMY Right 2010   Dr. Rosana Hoes, urology, due to renal cancer    Social History   Tobacco Use  . Smoking status: Current Some Day Smoker    Packs/day: 0.25    Years: 55.00    Pack years: 13.75    Types: Cigarettes  . Smokeless tobacco: Never Used  . Tobacco comment: cut back amount still   trying to quit  Substance Use Topics  . Alcohol use: No    Alcohol/week: 0.0 standard drinks    Family History  Problem Relation Age of Onset  . Cancer Mother        colon, kidney cancer  . Cancer Father        throat cancer  . Cancer Sister   . Heart attack Brother     Review of Systems  Constitutional: Negative for chills, fever and malaise/fatigue.  HENT: Negative for ear discharge, ear pain, hearing loss and tinnitus.        Ear itching  Eyes: Negative for blurred vision and double vision.  Respiratory: Negative for cough, shortness of breath and wheezing.   Cardiovascular: Negative for chest pain, palpitations and leg swelling.  Gastrointestinal: Positive for heartburn. Negative for abdominal pain, blood in stool, constipation, diarrhea, nausea and vomiting.  Genitourinary: Negative for dysuria, frequency and hematuria.       Vaginal pain  Musculoskeletal: Negative for back pain and joint pain.       Breast pain  Skin: Negative for rash.  Neurological: Negative for dizziness, weakness and headaches.     OBJECTIVE:  Today's Vitals   12/06/20 1533  BP: (!) 157/80  Pulse: 79  Temp: (!) 97.4 F (36.3 C)  SpO2: 98%  Weight: 175 lb (79.4 kg)  Height: 5\' 6"  (1.676 m)   Body mass index is 28.25 kg/m.   Physical Exam Constitutional:      General: She is not in acute distress.    Appearance: Normal appearance. She is not ill-appearing.  HENT:     Head: Normocephalic.     Right Ear: Tympanic membrane, ear canal and external ear normal. There is no impacted cerumen.     Left Ear: Tympanic membrane, ear canal and external ear normal. There is no impacted cerumen.  Cardiovascular:     Rate and Rhythm:  Normal rate and regular rhythm.     Pulses: Normal pulses.     Heart sounds: Normal heart sounds. No murmur heard. No friction rub. No gallop.   Pulmonary:     Effort: Pulmonary effort is normal. No respiratory distress.     Breath sounds: Normal breath sounds. No  stridor. No wheezing, rhonchi or rales.  Abdominal:     General: Bowel sounds are normal.     Palpations: Abdomen is soft.     Tenderness: There is no abdominal tenderness.  Musculoskeletal:     Right lower leg: No edema.     Left lower leg: No edema.  Skin:    General: Skin is warm and dry.     Findings: No rash.  Neurological:     Mental Status: She is alert and oriented to person, place, and time.  Psychiatric:        Mood and Affect: Mood normal.        Behavior: Behavior normal.     No results found for this or any previous visit (from the past 24 hour(s)).  No results found.   ASSESSMENT and PLAN  Problem List Items Addressed This Visit      Cardiovascular and Mediastinum   Essential hypertension   Relevant Medications   amLODipine (NORVASC) 5 MG tablet   Chronic diastolic CHF (congestive heart failure) (HCC)   Relevant Medications   amLODipine (NORVASC) 5 MG tablet     Endocrine   Hypothyroidism   Relevant Orders   TSH   Type 2 diabetes mellitus with diabetic polyneuropathy, without long-term current use of insulin (HCC) - Primary   Relevant Orders   Hemoglobin A1c     Musculoskeletal and Integument   Pressure injury of right heel, unstageable (HCC)     Genitourinary   CKD (chronic kidney disease), stage III (HCC)    Other Visit Diagnoses    Gastroesophageal reflux disease without esophagitis       Relevant Medications   famotidine (PEPCID) 20 MG tablet   pantoprazole (PROTONIX) 20 MG tablet   Vitamin D deficiency       Relevant Orders   Vitamin D, 25-hydroxy   Breast pain       Relevant Orders   MS DIGITAL SCREENING TOMO BILATERAL    Plan  Start amlodipine 5 mg daily (unsure when/why this medication was stopped)  Start protonix for reflux in addition to famotidine  Referral for vaginal pain sent to gyn, contact info provided  Mammogram order placed for breast pain   Return in about 3 months (around 03/06/2021).   Huston Foley Naylani Bradner,  FNP-BC Primary Care at [place], [place] [address] Ph.  [phone] Fax [phone]

## 2020-12-06 NOTE — Patient Instructions (Addendum)
Dr. Elly [facility] [place], [place] [address] [place] Maintenance After Age 83 After age 30, you are at a higher risk for certain long-term diseases and infections as well as injuries from falls. Falls are a major cause of broken bones and head injuries in people who are older than age 85. Getting regular preventive care can help to keep you healthy and well. Preventive care includes getting regular testing and making lifestyle changes as recommended by your health care provider. Talk with your health care provider about:  Which screenings and tests you should have. A screening is a test that checks for a disease when you have no symptoms.  A diet and exercise plan that is right for you. What should I know about screenings and tests to prevent falls? Screening and testing are the best ways to find a health problem early. Early diagnosis and treatment give you the best chance of managing medical conditions that are common after age 19. Certain conditions and lifestyle choices may make you more likely to have a fall. Your health care provider may recommend:  Regular vision checks. Poor vision and conditions such as cataracts can make you more likely to have a fall. If you wear glasses, make sure to get your prescription updated if your vision changes.  Medicine review. Work with your health care provider to regularly review all of the medicines you are taking, including over-the-counter medicines. Ask your health care provider about any side effects that may make you more likely to have a fall. Tell your health care provider if any medicines that you take make you feel dizzy or sleepy.  Osteoporosis screening. Osteoporosis is a condition that causes the bones to get weaker. This can make the bones weak and cause them to break more easily.  Blood pressure screening. Blood pressure changes and medicines to  control blood pressure can make you feel dizzy.  Strength and balance checks. Your health care provider may recommend certain tests to check your strength and balance while standing, walking, or changing positions.  Foot health exam. Foot pain and numbness, as well as not wearing proper footwear, can make you more likely to have a fall.  Depression screening. You may be more likely to have a fall if you have a fear of falling, feel emotionally low, or feel unable to do activities that you used to do.  Alcohol use screening. Using too much alcohol can affect your balance and may make you more likely to have a fall. What actions can I take to lower my risk of falls? General instructions  Talk with your health care provider about your risks for falling. Tell your health care provider if: ? You fall. Be sure to tell your health care provider about all falls, even ones that seem minor. ? You feel dizzy, sleepy, or off-balance.  Take over-the-counter and prescription medicines only as told by your health care provider. These include any supplements.  Eat a healthy diet and maintain a healthy weight. A healthy diet includes low-fat dairy products, low-fat (lean) meats, and fiber from whole grains, beans, and lots of fruits and vegetables. Home safety  Remove any tripping hazards, such as rugs, cords, and clutter.  Install safety equipment such as grab bars in bathrooms and safety rails on stairs.  Keep rooms and walkways well-lit. Activity  Follow a regular exercise program to stay fit. This will help you maintain your  balance. Ask your health care provider what types of exercise are appropriate for you.  If you need a cane or walker, use it as recommended by your health care provider.  Wear supportive shoes that have nonskid soles.   Lifestyle  Do not drink alcohol if your health care provider tells you not to drink.  If you drink alcohol, limit how much you have: ? 0-1 drink a day  for women. ? 0-2 drinks a day for men.  Be aware of how much alcohol is in your drink. In the [place] one drink equals one typical bottle of beer (12 oz), one-half glass of wine (5 oz), or one shot of hard liquor (1 oz).  Do not use any products that contain nicotine or tobacco, such as cigarettes and e-cigarettes. If you need help quitting, ask your health care provider. Summary  Having a healthy lifestyle and getting preventive care can help to protect your health and wellness after age 58.  Screening and testing are the best way to find a health problem early and help you avoid having a fall. Early diagnosis and treatment give you the best chance for managing medical conditions that are more common for people who are older than age 59.  Falls are a major cause of broken bones and head injuries in people who are older than age 18. Take precautions to prevent a fall at home.  Work with your health care provider to learn what changes you can make to improve your health and wellness and to prevent falls. This information is not intended to replace advice given to you by your health care provider. Make sure you discuss any questions you have with your health care provider. Document Revised: 03/05/2019 Document Reviewed: 09/25/2017 Elsevier Patient Education  2021 [employer].  If you have lab work done today you will be contacted with your lab results within the next 2 weeks.  If you have not heard from [place] then please contact us. The fastest way to get your results is to register for My Chart.   IF you received an x-ray today, you will receive an invoice from [facility]. Please contact [facility] at [phone] with questions or concerns regarding your invoice.   IF you received labwork today, you will receive an invoice from [facility]. Please contact [facility] at [phone] with questions or concerns regarding your invoice.   Our billing staff will not be able to assist  you with questions regarding bills from these companies.  You will be contacted with the lab results as soon as they are available. The fastest way to get your results is to activate your My Chart account. Instructions are located on the last page of this paperwork. If you have not heard from [place] regarding the results in 2 weeks, please contact this office.

## 2020-12-06 NOTE — Addendum Note (Signed)
Addended by: Anastasio Auerbach R on: 12/06/2020 04:36 PM   Modules accepted: Orders

## 2020-12-07 ENCOUNTER — Other Ambulatory Visit: Payer: Self-pay | Admitting: Family Medicine

## 2020-12-07 DIAGNOSIS — E1142 Type 2 diabetes mellitus with diabetic polyneuropathy: Secondary | ICD-10-CM

## 2020-12-07 LAB — HEMOGLOBIN A1C
Est. average glucose Bld gHb Est-mCnc: 177 mg/dL
Hgb A1c MFr Bld: 7.8 % — ABNORMAL HIGH (ref 4.8–5.6)

## 2020-12-07 LAB — TSH: TSH: 0.977 u[IU]/mL (ref 0.450–4.500)

## 2020-12-07 LAB — VITAMIN D 25 HYDROXY (VIT D DEFICIENCY, FRACTURES): Vit D, 25-Hydroxy: 33.7 ng/mL (ref 30.0–100.0)

## 2020-12-07 MED ORDER — DAPAGLIFLOZIN PROPANEDIOL 5 MG PO TABS: 5.0000 mg | ORAL_TABLET | Freq: Every day | ORAL | 1 refills | Status: DC

## 2020-12-07 NOTE — Progress Notes (Signed)
Overall labs look good. Her A1c is still elevated I am starting her on daily Farxiga to help with that. I sent this medication to her pharmacy.

## 2020-12-14 ENCOUNTER — Other Ambulatory Visit: Payer: Self-pay | Admitting: Family Medicine

## 2020-12-14 MED ORDER — CLONAZEPAM 1 MG PO TABS: ORAL_TABLET | ORAL | 4 refills | Status: DC

## 2020-12-14 NOTE — Telephone Encounter (Signed)
Requested medication (s) are due for refill today - yes  Requested medication (s) are on the active medication list -yes  Future visit scheduled -yes  Last refill: 05/09/20 #45 4 RF  Notes to clinic: Request non delegated Rx  Requested Prescriptions  Pending Prescriptions Disp Refills   clonazePAM (KLONOPIN) 1 MG tablet 45 tablet 4    Sig: TAKE HALF TABLET BY MOUTH EVERY MORNING AND TAKE ONE TABLET BY MOUTH AT BEDTIME      Not Delegated - Psychiatry:  Anxiolytics/Hypnotics Failed - 12/14/2020  3:15 PM      Failed - This refill cannot be delegated      Failed - Urine Drug Screen completed in last 360 days      Passed - Valid encounter within last 6 months    Recent Outpatient Visits           1 week ago Type 2 diabetes mellitus with diabetic polyneuropathy, without long-term current use of insulin ([place])   Primary Care at [place] [facility], Laurita Quint, FNP   3 months ago Diarrhea, unspecified type   Primary Care at [facility], Lilia Argue, MD   4 months ago Essential hypertension   Primary Care at [facility], Lilia Argue, MD   6 months ago Lower GI bleed   Primary Care at [facility], Lilia Argue, MD   7 months ago Essential hypertension   Primary Care at [facility], Lilia Argue, MD       Future Appointments             In 2 months Just, Laurita Quint, FNP Primary Care at [place], [facility]                 Requested Prescriptions  Pending Prescriptions Disp Refills   clonazePAM (KLONOPIN) 1 MG tablet 45 tablet 4    Sig: TAKE HALF TABLET BY MOUTH EVERY MORNING AND TAKE ONE TABLET BY MOUTH AT BEDTIME      Not Delegated - Psychiatry:  Anxiolytics/Hypnotics Failed - 12/14/2020  3:15 PM      Failed - This refill cannot be delegated      Failed - Urine Drug Screen completed in last 360 days      Passed - Valid encounter within last 6 months    Recent Outpatient Visits           1 week ago Type 2 diabetes mellitus with diabetic polyneuropathy, without long-term  current use of insulin ([place])   Primary Care at [place], Laurita Quint, FNP   3 months ago Diarrhea, unspecified type   Primary Care at [facility], Lilia Argue, MD   4 months ago Essential hypertension   Primary Care at [facility], Lilia Argue, MD   6 months ago Lower GI bleed   Primary Care at [facility], Lilia Argue, MD   7 months ago Essential hypertension   Primary Care at [facility], Lilia Argue, MD       Future Appointments             In 2 months Just, Laurita Quint, FNP Primary Care at [place], [facility]

## 2020-12-14 NOTE — Telephone Encounter (Signed)
Copied from [place] [id number]. Topic: Quick Communication - See Telephone Encounter >> Dec 14, 2020 12:59 PM Loma Boston wrote: CRM for notification. See Telephone encounter for: 12/14/20.Medication Refill - clonazePAM (KLONOPIN) 1 MG tablet 45 tablet 4 05/09/2020   Sig: TAKE HALF TABLET BY MOUTH EVERY MORNING AND TAKE ONE TABLET BY MOUTH AT BEDTIME  Patient taking differently: Take 0.5-1 mg by mouth See admin instructions. Take 1/2 tablet (0.5mg ) by mouth daily in the morning and take 1 tablet (1mg ) by mouth daily at bedtime.      Sent to pharmacy as: clonazePAM (KLONOPIN) 1 MG tablet     Has the patient contacted their pharmacy? yes (Agent: If no, request that the patient contact the pharmacy for the refill.) (Agent: If yes, when and what did the pharmacy advise?) call dr  Preferred Pharmacy (with phone number or street name):[place], [place] [address] Phone: [phone] Fax: [phone] Hours: Not open 24 hours    Agent: Please be advised that RX refills may take up to 3 business days. We ask that you follow-up with your pharmacy.

## 2020-12-14 NOTE — Telephone Encounter (Signed)
Patient is requesting a refill of the following medications: Requested Prescriptions   Pending Prescriptions Disp Refills   clonazePAM (KLONOPIN) 1 MG tablet 45 tablet 4    Sig: TAKE HALF TABLET BY MOUTH EVERY MORNING AND TAKE ONE TABLET BY MOUTH AT BEDTIME    Date of patient request: 12/14/20 Last office visit: 12/06/20 Date of last refill: 05/09/20 Last refill amount: 45 + 4 Follow up time period per chart: 03/07/21

## 2020-12-20 ENCOUNTER — Other Ambulatory Visit: Payer: Self-pay | Admitting: Family Medicine

## 2020-12-20 DIAGNOSIS — N644 Mastodynia: Secondary | ICD-10-CM

## 2021-01-16 ENCOUNTER — Emergency Department (HOSPITAL_COMMUNITY)
Admission: EM | Admit: 2021-01-16 | Discharge: 2021-01-16 | Disposition: A | Discharge status: Home / Self Care | Payer: Medicare Other | Attending: Emergency Medicine | Admitting: Emergency Medicine

## 2021-01-16 ENCOUNTER — Encounter (HOSPITAL_COMMUNITY): Payer: Self-pay | Admitting: Emergency Medicine

## 2021-01-16 DIAGNOSIS — R739 Hyperglycemia, unspecified: Secondary | ICD-10-CM

## 2021-01-16 DIAGNOSIS — E039 Hypothyroidism, unspecified: Secondary | ICD-10-CM | POA: Insufficient documentation

## 2021-01-16 DIAGNOSIS — I5032 Chronic diastolic (congestive) heart failure: Secondary | ICD-10-CM | POA: Insufficient documentation

## 2021-01-16 DIAGNOSIS — I130 Hypertensive heart and chronic kidney disease with heart failure and stage 1 through stage 4 chronic kidney disease, or unspecified chronic kidney disease: Secondary | ICD-10-CM | POA: Diagnosis not present

## 2021-01-16 DIAGNOSIS — Z79899 Other long term (current) drug therapy: Secondary | ICD-10-CM | POA: Diagnosis not present

## 2021-01-16 DIAGNOSIS — Z20822 Contact with and (suspected) exposure to covid-19: Secondary | ICD-10-CM | POA: Diagnosis not present

## 2021-01-16 DIAGNOSIS — M791 Myalgia, unspecified site: Secondary | ICD-10-CM | POA: Diagnosis not present

## 2021-01-16 DIAGNOSIS — Z85828 Personal history of other malignant neoplasm of skin: Secondary | ICD-10-CM | POA: Insufficient documentation

## 2021-01-16 DIAGNOSIS — E1140 Type 2 diabetes mellitus with diabetic neuropathy, unspecified: Secondary | ICD-10-CM | POA: Insufficient documentation

## 2021-01-16 DIAGNOSIS — K2100 Gastro-esophageal reflux disease with esophagitis, without bleeding: Secondary | ICD-10-CM | POA: Insufficient documentation

## 2021-01-16 DIAGNOSIS — N183 Chronic kidney disease, stage 3 unspecified: Secondary | ICD-10-CM | POA: Diagnosis not present

## 2021-01-16 LAB — URINALYSIS, ROUTINE W REFLEX MICROSCOPIC
Bilirubin Urine: NEGATIVE
Glucose, UA: NEGATIVE mg/dL
Hgb urine dipstick: NEGATIVE
Ketones, ur: NEGATIVE mg/dL
Leukocytes,Ua: NEGATIVE
Nitrite: NEGATIVE
Protein, ur: NEGATIVE mg/dL
Specific Gravity, Urine: 1.021 (ref 1.005–1.030)
pH: 5 (ref 5.0–8.0)

## 2021-01-16 LAB — BASIC METABOLIC PANEL
Anion gap: 10 (ref 5–15)
BUN: 14 mg/dL (ref 8–23)
CO2: 27 mmol/L (ref 22–32)
Calcium: 9.4 mg/dL (ref 8.9–10.3)
Chloride: 103 mmol/L (ref 98–111)
Creatinine, Ser: 1.3 mg/dL — ABNORMAL HIGH (ref 0.44–1.00)
GFR, Estimated: 41 mL/min — ABNORMAL LOW (ref >60)
Glucose, Bld: 161 mg/dL — ABNORMAL HIGH (ref 70–99)
Potassium: 4.4 mmol/L (ref 3.5–5.1)
Sodium: 140 mmol/L (ref 135–145)

## 2021-01-16 LAB — CBC
HCT: 40.8 % (ref 36.0–46.0)
Hemoglobin: 13.9 g/dL (ref 12.0–15.0)
MCH: 33.3 pg (ref 26.0–34.0)
MCHC: 34.1 g/dL (ref 30.0–36.0)
MCV: 97.8 fL (ref 80.0–100.0)
Platelets: 182 10*3/uL (ref 150–400)
RBC: 4.17 MIL/uL (ref 3.87–5.11)
RDW: 14.4 % (ref 11.5–15.5)
WBC: 5.9 10*3/uL (ref 4.0–10.5)
nRBC: 0 % (ref 0.0–0.2)

## 2021-01-16 LAB — HEPATIC FUNCTION PANEL
ALT: 19 U/L (ref 0–44)
AST: 21 U/L (ref 15–41)
Albumin: 3.8 g/dL (ref 3.5–5.0)
Alkaline Phosphatase: 49 U/L (ref 38–126)
Bilirubin, Direct: 0.1 mg/dL (ref 0.0–0.2)
Indirect Bilirubin: 0.6 mg/dL (ref 0.3–0.9)
Total Bilirubin: 0.7 mg/dL (ref 0.3–1.2)
Total Protein: 6.9 g/dL (ref 6.5–8.1)

## 2021-01-16 LAB — RESP PANEL BY RT-PCR (FLU A&B, COVID) ARPGX2
Influenza A by PCR: NEGATIVE
Influenza B by PCR: NEGATIVE
SARS Coronavirus 2 by RT PCR: NEGATIVE

## 2021-01-16 LAB — LIPASE, BLOOD: Lipase: 35 U/L (ref 11–51)

## 2021-01-16 NOTE — ED Provider Notes (Signed)
[facility] EMERGENCY DEPARTMENT Provider Note   CSN: [id number] Arrival date & time: 01/16/21  1236     History Chief Complaint  Patient presents with  . Generalized Body Aches    Sherri Burns is a 85 y.o. female.  HPI    85 year old female history of type 2 diabetes, reflux, anxiety, history of nephrectomy secondary to cancer presents today complaining of diffuse body aches and not feeling well for the past 3 weeks.  She states this is happened in the past.  She feels like it is associated with her anxiety.  She does endorse that she has had some chills but has not had fever.  She has been vaccinated for COVID but has not received a booster dose.  She describes ongoing chest burning sensation.  She has had this for a while.  Noted in her notes from primary care and early January she was having similar symptoms and was advised to restart her Protonix.  She describes this as substernal burning that does come and go up to 4 times per day.  It is not worse with exertion.  She reports having similar pain since being a teenager. Past Medical History:  Diagnosis Date  . Abnormal EKG 02/07/2016   Inferolateral T wave inversion and ST depression.  . Acute calculous cholecystitis   . Anxiety   . Arthritis   . Chronic diastolic CHF (congestive heart failure) ([place]) 02/21/2018  . Colon polyps 11/2008   tubular adenoma  . Diabetes mellitus   . Esophageal problem    esophageal dilation  . GERD (gastroesophageal reflux disease)    + hpylori  . GI bleeding 01/2018  . Headache   . Hyperlipidemia   . Hypertension   . Hypothyroidism   . Ischemic colitis ([place])   . Renal cancer, right ([place]) 2010   s/p Rt nephrectomy by Dr. Rosana Hoes  . Renal insufficiency     Patient Active Problem List   Diagnosis Date Noted  . Pressure injury of right heel, unstageable ([place]) 12/06/2020  . Lower GI bleed 05/31/2020  . Statin intolerance 11/12/2019  . GAD (generalized anxiety disorder)  08/07/2019  . Long term prescription benzodiazepine use 08/07/2019  . GI bleed 04/10/2019  . S/p nephrectomy 06/24/2018  . Chronic diastolic CHF (congestive heart failure) ([place]) 02/21/2018  . Acute lower GI bleeding 02/20/2018  . Hyperlipidemia   . Abnormal EKG 02/07/2016  . Type 2 diabetes mellitus with diabetic polyneuropathy, without long-term current use of insulin ([place]) 09/23/2012  . Essential hypertension   . Hypothyroidism   . CKD (chronic kidney disease), stage III [facility])     Past Surgical History:  Procedure Laterality Date  . ABDOMINAL HYSTERECTOMY  1978   partial  . CATARACT EXTRACTION    . CHOLECYSTECTOMY N/A 04/04/2016   Procedure: LAPAROSCOPIC CHOLECYSTECTOMY;  Surgeon: Autumn Messing III, MD;  Location: [facility];  Service: General;  Laterality: N/A;  . COLONOSCOPY    . ESOPHAGEAL DILATION  2016  . HERNIA REPAIR    . LAPAROSCOPIC CHOLECYSTECTOMY  04/04/2016  . NEPHRECTOMY Right 2010   Dr. Rosana Hoes, urology, due to renal cancer     OB History   No obstetric history on file.     Family History  Problem Relation Age of Onset  . Cancer Mother        colon, kidney cancer  . Cancer Father        throat cancer  . Cancer Sister   . Heart attack Brother  Social History   Tobacco Use  . Smoking status: Current Some Day Smoker    Packs/day: 0.25    Years: 55.00    Pack years: 13.75    Types: Cigarettes  . Smokeless tobacco: Never Used  . Tobacco comment: cut back amount still  trying to quit  Vaping Use  . Vaping Use: Never used  Substance Use Topics  . Alcohol use: No    Alcohol/week: 0.0 standard drinks  . Drug use: No    Home Medications Prior to Admission medications   Medication Sig Start Date End Date Taking? Authorizing Provider  acetaminophen (TYLENOL) 325 MG tablet Take 2 tablets (650 mg total) by mouth every 6 (six) hours as needed for mild pain (or Fever >/= 101). Patient taking differently: Take 325 mg by mouth every 6 (six) hours as needed for  mild pain. 06/02/20   Allie Bossier, MD  acetic acid 2 % otic solution Place 4 drops into both ears 3 (three) times daily. 09/17/19   Jacelyn Pi, Lilia Argue, MD  allopurinol (ZYLOPRIM) 100 MG tablet Take 1 tablet (100 mg total) by mouth daily. 11/11/20   Trula Slade, DPM  amLODipine (NORVASC) 5 MG tablet Take 1 tablet (5 mg total) by mouth daily. 12/06/20   Just, Laurita Quint, FNP  brimonidine (ALPHAGAN) 0.2 % ophthalmic solution Place 1 drop into the left eye 2 (two) times daily as needed (Dry eyes). 10/27/19   [provider]  cholecalciferol (VITAMIN D3) 25 MCG (1000 UNIT) tablet Take 1,000 Units by mouth daily.    [provider]  clonazePAM (KLONOPIN) 1 MG tablet TAKE HALF TABLET BY MOUTH EVERY MORNING AND TAKE ONE TABLET BY MOUTH AT BEDTIME 12/14/20   Just, Laurita Quint, FNP  dapagliflozin propanediol (FARXIGA) 5 MG TABS tablet Take 1 tablet (5 mg total) by mouth daily before breakfast. 12/07/20   Just, Laurita Quint, FNP  diclofenac Sodium (VOLTAREN) 1 % GEL Apply 2 g topically 3 (three) times daily as needed (for joint pain). 09/13/20   Nuala Alpha A, PA-C  famotidine (PEPCID) 20 MG tablet Take 1 tablet (20 mg total) by mouth 2 (two) times daily. 12/06/20   Just, Laurita Quint, FNP  fluticasone (FLONASE) 50 MCG/ACT nasal spray Place 1-2 sprays into both nostrils daily. 06/17/20   Daleen Squibb, MD  furosemide (LASIX) 40 MG tablet Take 0.5 tablets (20 mg total) by mouth daily. 08/08/20   Jacelyn Pi, Lilia Argue, MD  glipiZIDE (GLUCOTROL) 10 MG tablet Take 0.5 tablets (5 mg total) by mouth daily before breakfast. 06/16/20   Jacelyn Pi, Lilia Argue, MD  levothyroxine (SYNTHROID) 75 MCG tablet Take 1 tablet (75 mcg total) by mouth daily before breakfast. 08/18/20   Jacelyn Pi, Lilia Argue, MD  metoprolol tartrate (LOPRESSOR) 100 MG tablet Take 1 tablet (100 mg total) by mouth 2 (two) times daily. 06/16/20   Daleen Squibb, MD  ondansetron (ZOFRAN) 4 MG tablet Take 1 tablet (4 mg total)  by mouth every 6 (six) hours as needed for nausea. 06/02/20   Allie Bossier, MD  pantoprazole (PROTONIX) 20 MG tablet Take 1 tablet (20 mg total) by mouth daily. 12/06/20   Just, Laurita Quint, FNP  polyethylene glycol (MIRALAX / GLYCOLAX) 17 g packet Take 17 g by mouth daily as needed for mild constipation. 06/02/20   Allie Bossier, MD  vitamin E 100 UNIT capsule Take 100 Units by mouth daily.    [provider]  Allergies    Ace inhibitors, Codeine, Januvia [sitagliptin], Metformin and related, Ciprocin-fluocin-procin [fluocinolone acetonide], Clonidine derivatives, Crestor [rosuvastatin calcium], Penicillins, Simvastatin, and Tessalon perles  Review of Systems   Review of Systems  All other systems reviewed and are negative.   Physical Exam Updated Vital Signs BP (!) 156/82 (BP Location: Right Arm)   Pulse 72   Temp 98.4 F (36.9 C) (Oral)   Resp 11   Ht 1.676 m (5\' 6" )   SpO2 100%   BMI 28.25 kg/m   Physical Exam Vitals and nursing note reviewed.  Constitutional:      Appearance: Normal appearance. She is normal weight.  HENT:     Head: Normocephalic and atraumatic.     Right Ear: External ear normal.     Left Ear: External ear normal.     Nose: Nose normal.     Mouth/Throat:     Pharynx: Oropharynx is clear.  Eyes:     Pupils: Pupils are equal, round, and reactive to light.  Cardiovascular:     Rate and Rhythm: Normal rate and regular rhythm.     Pulses: Normal pulses.     Heart sounds: Normal heart sounds.  Pulmonary:     Effort: Pulmonary effort is normal.     Breath sounds: Normal breath sounds.  Abdominal:     General: Bowel sounds are normal.     Palpations: Abdomen is soft.     Tenderness: There is no abdominal tenderness.  Musculoskeletal:        General: Normal range of motion.     Cervical back: Normal range of motion.  Skin:    General: Skin is warm.     Capillary Refill: Capillary refill takes less than 2 seconds.  Neurological:      General: No focal deficit present.     Mental Status: She is alert.  Psychiatric:        Mood and Affect: Mood normal.     ED Results / Procedures / Treatments   Labs (all labs ordered are listed, but only abnormal results are displayed) Labs Reviewed  BASIC METABOLIC PANEL - Abnormal; Notable for the following components:      Result Value   Glucose, Bld 161 (*)    Creatinine, Ser 1.30 (*)    GFR, Estimated 41 (*)    All other components within normal limits  RESP PANEL BY RT-PCR (FLU A&B, COVID) ARPGX2  CBC  HEPATIC FUNCTION PANEL  LIPASE, BLOOD  URINALYSIS, ROUTINE W REFLEX MICROSCOPIC  CBG MONITORING, ED    EKG EKG Interpretation  Date/Time:  Monday January 16 2021 15:59:59 EST Ventricular Rate:  58 PR Interval:    QRS Duration: 106 QT Interval:  397 QTC Calculation: 390 R Axis:   -22 Text Interpretation: Sinus rhythm Probable left ventricular hypertrophy Nonspecific T abnormalities, diffuse leads No significant change since last tracing 31 May 2020 Confirmed by Pattricia Boss [phone] on 01/16/2021 4:10:58 PM   Radiology No results found.  Procedures Procedures   Medications Ordered in ED Medications - No data to display  ED Course  I have reviewed the triage vital signs and the nursing notes.  Pertinent labs & imaging results that were available during my care of the patient were reviewed by me and considered in my medical decision making (see chart for details).    MDM Rules/Calculators/A&P  85 yo female complaining of generalized muscle aches, substernal chest discomfort for many years, who presents today for evaluation of the above.  Patient's had labs drawn.  She is mildly hyperglycemic at 161 with no diabetes and no evidence of DKA.  She has a mildly elevated creatinine at 1.3 which compares consistent with her previous creatinines.  CBC is completely within normal limits.  Covid test is negative.  EKG without evidence of acute  ischemia unchanged from prior.  Given ongoing duration of her chest pain without any change, no troponin was obtained.  She describes these as similar to her previous reflux and is unclear if she has been taking Protonix which she was prescribed in January. She is advised regarding taking her Protonix, managing her anxiety, need for follow-up and return precautions and voices understanding.  Final Clinical Impression(s) / ED Diagnoses Final diagnoses:  Myalgia  Gastroesophageal reflux disease with esophagitis without hemorrhage  Hyperglycemia    Rx / DC Orders ED Discharge Orders    None       Pattricia Boss, MD 01/16/21 1746

## 2021-01-16 NOTE — ED Triage Notes (Signed)
Pt arrives to ED with chief complaint of all over body pain pt states all her joint have been extremely sore for 3 weeks she denies a known fever just feels poor. Pt denies any chest pain or trouble breathing

## 2021-01-16 NOTE — Discharge Instructions (Addendum)
Lab work appears to be within normal limits the exception of your blood sugar being elevated in the 160s.  Your kidney function is also mildly elevated but stable from prior.  Please continue your Protonix prescription which was prescribed at your January 11 doctor's visit.  There appear to be refills remaining on this. Your Covid test here is negative. Please get your booster shot and follow-up with your primary care doctor. Return to the emergency department if you have worsening symptoms including fever, new pain, shortness of breath or unable to tolerate liquids.

## 2021-02-03 ENCOUNTER — Other Ambulatory Visit: Payer: Medicare Other

## 2021-03-07 ENCOUNTER — Ambulatory Visit: Payer: Medicare Other | Admitting: Family Medicine

## 2021-03-28 ENCOUNTER — Emergency Department (HOSPITAL_COMMUNITY): Payer: Medicare Other

## 2021-03-28 ENCOUNTER — Emergency Department (HOSPITAL_COMMUNITY)
Admission: EM | Admit: 2021-03-28 | Discharge: 2021-03-29 | Disposition: A | Discharge status: Home / Self Care | Payer: Medicare Other | Attending: Emergency Medicine | Admitting: Emergency Medicine

## 2021-03-28 DIAGNOSIS — I5032 Chronic diastolic (congestive) heart failure: Secondary | ICD-10-CM | POA: Insufficient documentation

## 2021-03-28 DIAGNOSIS — K625 Hemorrhage of anus and rectum: Secondary | ICD-10-CM | POA: Diagnosis present

## 2021-03-28 DIAGNOSIS — I130 Hypertensive heart and chronic kidney disease with heart failure and stage 1 through stage 4 chronic kidney disease, or unspecified chronic kidney disease: Secondary | ICD-10-CM | POA: Diagnosis not present

## 2021-03-28 DIAGNOSIS — E1142 Type 2 diabetes mellitus with diabetic polyneuropathy: Secondary | ICD-10-CM | POA: Diagnosis not present

## 2021-03-28 DIAGNOSIS — Z85528 Personal history of other malignant neoplasm of kidney: Secondary | ICD-10-CM | POA: Insufficient documentation

## 2021-03-28 DIAGNOSIS — E039 Hypothyroidism, unspecified: Secondary | ICD-10-CM | POA: Insufficient documentation

## 2021-03-28 DIAGNOSIS — R1030 Lower abdominal pain, unspecified: Secondary | ICD-10-CM | POA: Diagnosis not present

## 2021-03-28 DIAGNOSIS — E1122 Type 2 diabetes mellitus with diabetic chronic kidney disease: Secondary | ICD-10-CM | POA: Diagnosis not present

## 2021-03-28 DIAGNOSIS — N183 Chronic kidney disease, stage 3 unspecified: Secondary | ICD-10-CM | POA: Insufficient documentation

## 2021-03-28 DIAGNOSIS — M791 Myalgia, unspecified site: Secondary | ICD-10-CM | POA: Diagnosis not present

## 2021-03-28 DIAGNOSIS — Z7984 Long term (current) use of oral hypoglycemic drugs: Secondary | ICD-10-CM | POA: Insufficient documentation

## 2021-03-28 DIAGNOSIS — F17210 Nicotine dependence, cigarettes, uncomplicated: Secondary | ICD-10-CM | POA: Diagnosis not present

## 2021-03-28 DIAGNOSIS — Z79899 Other long term (current) drug therapy: Secondary | ICD-10-CM | POA: Insufficient documentation

## 2021-03-28 LAB — COMPREHENSIVE METABOLIC PANEL
ALT: 14 U/L (ref 0–44)
AST: 31 U/L (ref 15–41)
Albumin: 3.9 g/dL (ref 3.5–5.0)
Alkaline Phosphatase: 48 U/L (ref 38–126)
Anion gap: 7 (ref 5–15)
BUN: 16 mg/dL (ref 8–23)
CO2: 29 mmol/L (ref 22–32)
Calcium: 9.7 mg/dL (ref 8.9–10.3)
Chloride: 104 mmol/L (ref 98–111)
Creatinine, Ser: 1.25 mg/dL — ABNORMAL HIGH (ref 0.44–1.00)
GFR, Estimated: 43 mL/min — ABNORMAL LOW (ref >60)
Glucose, Bld: 165 mg/dL — ABNORMAL HIGH (ref 70–99)
Potassium: 4.5 mmol/L (ref 3.5–5.1)
Sodium: 140 mmol/L (ref 135–145)
Total Bilirubin: 1.5 mg/dL — ABNORMAL HIGH (ref 0.3–1.2)
Total Protein: 6.9 g/dL (ref 6.5–8.1)

## 2021-03-28 LAB — CBC WITH DIFFERENTIAL/PLATELET
Abs Immature Granulocytes: 0.05 10*3/uL (ref 0.00–0.07)
Basophils Absolute: 0.1 10*3/uL (ref 0.0–0.1)
Basophils Relative: 1 %
Eosinophils Absolute: 0.2 10*3/uL (ref 0.0–0.5)
Eosinophils Relative: 2 %
HCT: 39.7 % (ref 36.0–46.0)
Hemoglobin: 13.3 g/dL (ref 12.0–15.0)
Immature Granulocytes: 1 %
Lymphocytes Relative: 35 %
Lymphs Abs: 2.9 10*3/uL (ref 0.7–4.0)
MCH: 33 pg (ref 26.0–34.0)
MCHC: 33.5 g/dL (ref 30.0–36.0)
MCV: 98.5 fL (ref 80.0–100.0)
Monocytes Absolute: 0.6 10*3/uL (ref 0.1–1.0)
Monocytes Relative: 7 %
Neutro Abs: 4.5 10*3/uL (ref 1.7–7.7)
Neutrophils Relative %: 54 %
Platelets: 196 10*3/uL (ref 150–400)
RBC: 4.03 MIL/uL (ref 3.87–5.11)
RDW: 13.7 % (ref 11.5–15.5)
WBC: 8.2 10*3/uL (ref 4.0–10.5)
nRBC: 0 % (ref 0.0–0.2)

## 2021-03-28 LAB — TYPE AND SCREEN
ABO/RH(D): O POS
Antibody Screen: NEGATIVE

## 2021-03-28 LAB — TROPONIN I (HIGH SENSITIVITY): Troponin I (High Sensitivity): 6 ng/L (ref <18)

## 2021-03-28 LAB — LIPASE, BLOOD: Lipase: 39 U/L (ref 11–51)

## 2021-03-28 NOTE — ED Provider Notes (Signed)
Emergency Medicine Provider Triage Evaluation Note  Sherri Burns , a 85 y.o. female  was evaluated in triage.  Pt complains of myalgias, melena. Reports lower abd pain and chest pain last week which is now improved. No sob  Review of Systems  Positive: abd pain, melena, myalgias Negative:  Physical Exam  BP (!) 161/86 (BP Location: Left Arm)   Pulse 87   Temp 98.6 F (37 C) (Oral)   Resp 16   SpO2 96%  Gen:   Awake, no distress   Resp:  Normal effort  MSK:   Moves extremities without difficulty  Other:  llq TTP, lungs ctab, heart with rrr  Medical Decision Making  Medically screening exam initiated at 10:23 PM.  Appropriate orders placed.  [name] was informed that the remainder of the evaluation will be completed by another provider, this initial triage assessment does not replace that evaluation, and the importance of remaining in the ED until their evaluation is complete.     Rodney Booze, PA-C 03/28/21 [place], [place], DO 03/29/21 1543

## 2021-03-28 NOTE — ED Triage Notes (Signed)
Pt reports been having generalized pain all over and states was using the bathroom and and wipe and saw blood and in the commode and states it was dark and tarry

## 2021-03-29 LAB — POC OCCULT BLOOD, ED: Fecal Occult Bld: NEGATIVE

## 2021-03-29 LAB — CBC
HCT: 42.4 % (ref 36.0–46.0)
Hemoglobin: 13.9 g/dL (ref 12.0–15.0)
MCH: 32.4 pg (ref 26.0–34.0)
MCHC: 32.8 g/dL (ref 30.0–36.0)
MCV: 98.8 fL (ref 80.0–100.0)
Platelets: 174 10*3/uL (ref 150–400)
RBC: 4.29 MIL/uL (ref 3.87–5.11)
RDW: 13.8 % (ref 11.5–15.5)
WBC: 6.9 10*3/uL (ref 4.0–10.5)
nRBC: 0 % (ref 0.0–0.2)

## 2021-03-29 LAB — TROPONIN I (HIGH SENSITIVITY): Troponin I (High Sensitivity): 7 ng/L (ref <18)

## 2021-03-29 NOTE — ED Provider Notes (Signed)
[facility] EMERGENCY DEPARTMENT Provider Note   CSN: [id number] Arrival date & time: 03/28/21  2112     History Chief Complaint  Patient presents with  . GI Bleeding    Sherri Burns is a 85 y.o. female.  Patient presents to the emergency department with a chief complaint of rectal bleeding.  She states that she noticed the bleeding today in the toilet and on the toilet paper.  She states that the blood was red, but also was mixed with some dark blood.  She complains of lower abdominal pain and generalized body aches.  She denies any fevers or chills.  Denies nausea or vomiting.  Denies any treatments prior to arrival.  She states that she does take MiraLAX.  She reports that she is followed by [facility].  The history is provided by the patient. No language interpreter was used.       Past Medical History:  Diagnosis Date  . Abnormal EKG 02/07/2016   Inferolateral T wave inversion and ST depression.  . Acute calculous cholecystitis   . Anxiety   . Arthritis   . Chronic diastolic CHF (congestive heart failure) ([place]) 02/21/2018  . Colon polyps 11/2008   tubular adenoma  . Diabetes mellitus   . Esophageal problem    esophageal dilation  . GERD (gastroesophageal reflux disease)    + hpylori  . GI bleeding 01/2018  . Headache   . Hyperlipidemia   . Hypertension   . Hypothyroidism   . Ischemic colitis ([place])   . Renal cancer, right ([place]) 2010   s/p Rt nephrectomy by Dr. Rosana Hoes  . Renal insufficiency     Patient Active Problem List   Diagnosis Date Noted  . Pressure injury of right heel, unstageable ([place]) 12/06/2020  . Lower GI bleed 05/31/2020  . Statin intolerance 11/12/2019  . GAD (generalized anxiety disorder) 08/07/2019  . Long term prescription benzodiazepine use 08/07/2019  . GI bleed 04/10/2019  . S/p nephrectomy 06/24/2018  . Chronic diastolic CHF (congestive heart failure) ([place]) 02/21/2018  . Acute lower GI bleeding 02/20/2018  .  Hyperlipidemia   . Abnormal EKG 02/07/2016  . Type 2 diabetes mellitus with diabetic polyneuropathy, without long-term current use of insulin ([place]) 09/23/2012  . Essential hypertension   . Hypothyroidism   . CKD (chronic kidney disease), stage III [facility])     Past Surgical History:  Procedure Laterality Date  . ABDOMINAL HYSTERECTOMY  1978   partial  . CATARACT EXTRACTION    . CHOLECYSTECTOMY N/A 04/04/2016   Procedure: LAPAROSCOPIC CHOLECYSTECTOMY;  Surgeon: Autumn Messing III, MD;  Location: [place];  Service: General;  Laterality: N/A;  . COLONOSCOPY    . ESOPHAGEAL DILATION  2016  . HERNIA REPAIR    . LAPAROSCOPIC CHOLECYSTECTOMY  04/04/2016  . NEPHRECTOMY Right 2010   Dr. Rosana Hoes, urology, due to renal cancer     OB History   No obstetric history on file.     Family History  Problem Relation Age of Onset  . Cancer Mother        colon, kidney cancer  . Cancer Father        throat cancer  . Cancer Sister   . Heart attack Brother     Social History   Tobacco Use  . Smoking status: Current Some Day Smoker    Packs/day: 0.25    Years: 55.00    Pack years: 13.75    Types: Cigarettes  . Smokeless tobacco: Never Used  .  Tobacco comment: cut back amount still  trying to quit  Vaping Use  . Vaping Use: Never used  Substance Use Topics  . Alcohol use: No    Alcohol/week: 0.0 standard drinks  . Drug use: No    Home Medications Prior to Admission medications   Medication Sig Start Date End Date Taking? Authorizing Provider  acetaminophen (TYLENOL) 325 MG tablet Take 2 tablets (650 mg total) by mouth every 6 (six) hours as needed for mild pain (or Fever >/= 101). Patient taking differently: Take 325 mg by mouth every 6 (six) hours as needed for mild pain. 06/02/20   Allie Bossier, MD  acetic acid 2 % otic solution Place 4 drops into both ears 3 (three) times daily. 09/17/19   Jacelyn Pi, Lilia Argue, MD  allopurinol (ZYLOPRIM) 100 MG tablet Take 1 tablet (100 mg total) by mouth  daily. 11/11/20   Trula Slade, DPM  amLODipine (NORVASC) 5 MG tablet Take 1 tablet (5 mg total) by mouth daily. 12/06/20   Just, Laurita Quint, FNP  brimonidine (ALPHAGAN) 0.2 % ophthalmic solution Place 1 drop into the left eye 2 (two) times daily as needed (Dry eyes). 10/27/19   [provider]  cholecalciferol (VITAMIN D3) 25 MCG (1000 UNIT) tablet Take 1,000 Units by mouth daily.    [provider]  clonazePAM (KLONOPIN) 1 MG tablet TAKE HALF TABLET BY MOUTH EVERY MORNING AND TAKE ONE TABLET BY MOUTH AT BEDTIME 12/14/20   Just, Laurita Quint, FNP  dapagliflozin propanediol (FARXIGA) 5 MG TABS tablet Take 1 tablet (5 mg total) by mouth daily before breakfast. 12/07/20   Just, Laurita Quint, FNP  diclofenac Sodium (VOLTAREN) 1 % GEL Apply 2 g topically 3 (three) times daily as needed (for joint pain). 09/13/20   Nuala Alpha A, PA-C  famotidine (PEPCID) 20 MG tablet Take 1 tablet (20 mg total) by mouth 2 (two) times daily. 12/06/20   Just, Laurita Quint, FNP  fluticasone (FLONASE) 50 MCG/ACT nasal spray Place 1-2 sprays into both nostrils daily. 06/17/20   Daleen Squibb, MD  furosemide (LASIX) 40 MG tablet Take 0.5 tablets (20 mg total) by mouth daily. 08/08/20   Jacelyn Pi, Lilia Argue, MD  glipiZIDE (GLUCOTROL) 10 MG tablet Take 0.5 tablets (5 mg total) by mouth daily before breakfast. 06/16/20   Jacelyn Pi, Lilia Argue, MD  levothyroxine (SYNTHROID) 75 MCG tablet Take 1 tablet (75 mcg total) by mouth daily before breakfast. 08/18/20   Jacelyn Pi, Lilia Argue, MD  metoprolol tartrate (LOPRESSOR) 100 MG tablet Take 1 tablet (100 mg total) by mouth 2 (two) times daily. 06/16/20   Daleen Squibb, MD  ondansetron (ZOFRAN) 4 MG tablet Take 1 tablet (4 mg total) by mouth every 6 (six) hours as needed for nausea. 06/02/20   Allie Bossier, MD  pantoprazole (PROTONIX) 20 MG tablet Take 1 tablet (20 mg total) by mouth daily. 12/06/20   Just, Laurita Quint, FNP  polyethylene glycol (MIRALAX / GLYCOLAX) 17  g packet Take 17 g by mouth daily as needed for mild constipation. 06/02/20   Allie Bossier, MD  vitamin E 100 UNIT capsule Take 100 Units by mouth daily.    [provider]    Allergies    Ace inhibitors, Codeine, Januvia [sitagliptin], Metformin and related, Ciprocin-fluocin-procin [fluocinolone acetonide], Clonidine derivatives, Crestor [rosuvastatin calcium], Penicillins, Simvastatin, and Tessalon perles  Review of Systems   Review of Systems  All other systems reviewed and are negative.  Physical Exam Updated Vital Signs BP (!) 140/94 (BP Location: Right Arm)   Pulse 98   Temp 98.6 F (37 C) (Oral)   Resp 17   SpO2 97%   Physical Exam Vitals and nursing note reviewed.  Constitutional:      General: She is not in acute distress.    Appearance: She is well-developed.  HENT:     Head: Normocephalic and atraumatic.  Eyes:     Conjunctiva/sclera: Conjunctivae normal.  Cardiovascular:     Rate and Rhythm: Normal rate and regular rhythm.     Heart sounds: No murmur heard.   Pulmonary:     Effort: Pulmonary effort is normal. No respiratory distress.     Breath sounds: Normal breath sounds.  Abdominal:     Palpations: Abdomen is soft.     Tenderness: There is no abdominal tenderness.  Genitourinary:    Comments: Chaperone present External hemorrhoid palpated No gross blood or stool on DRE Musculoskeletal:     Cervical back: Neck supple.  Skin:    General: Skin is warm and dry.  Neurological:     Mental Status: She is alert and oriented to person, place, and time.  Psychiatric:        Mood and Affect: Mood normal.        Behavior: Behavior normal.     ED Results / Procedures / Treatments   Labs (all labs ordered are listed, but only abnormal results are displayed) Labs Reviewed  COMPREHENSIVE METABOLIC PANEL - Abnormal; Notable for the following components:      Result Value   Glucose, Bld 165 (*)    Creatinine, Ser 1.25 (*)    Total Bilirubin 1.5  (*)    GFR, Estimated 43 (*)    All other components within normal limits  CBC WITH DIFFERENTIAL/PLATELET  LIPASE, BLOOD  POC OCCULT BLOOD, ED  TYPE AND SCREEN  TROPONIN I (HIGH SENSITIVITY)  TROPONIN I (HIGH SENSITIVITY)    EKG None  Radiology CT ABDOMEN PELVIS WO CONTRAST  Result Date: 03/28/2021 CLINICAL DATA:  Generalized abdominal pain, dark stool EXAM: CT ABDOMEN AND PELVIS WITHOUT CONTRAST TECHNIQUE: Multidetector CT imaging of the abdomen and pelvis was performed following the standard protocol without IV contrast. COMPARISON:  05/31/2020 FINDINGS: Lower chest: No acute pleural or parenchymal lung disease. Unenhanced CT was performed per clinician order. Lack of IV contrast limits sensitivity and specificity, especially for evaluation of abdominal/pelvic solid viscera. Hepatobiliary: No focal liver abnormality is seen. Status post cholecystectomy. No biliary dilatation. Pancreas: Unremarkable. No pancreatic ductal dilatation or surrounding inflammatory changes. Spleen: Normal in size without focal abnormality. Adrenals/Urinary Tract: Postsurgical changes from right nephrectomy and right adrenalectomy. No abnormalities within the nephrectomy bed. The left adrenal and left kidney are unremarkable. No urinary tract calculi or obstruction. The bladder is unremarkable. Stomach/Bowel: No bowel obstruction or ileus. Normal appendix lower central pelvis. Sigmoid diverticulosis without diverticulitis. Vascular/Lymphatic: Aortic atherosclerosis. No enlarged abdominal or pelvic lymph nodes. Reproductive: Status post hysterectomy. No adnexal masses. Other: No free fluid or free gas. Fat containing left inguinal hernia. No bowel herniation. Musculoskeletal: No acute or destructive bony lesions. Reconstructed images demonstrate no additional findings. IMPRESSION: 1. Sigmoid diverticulosis without diverticulitis. 2. Postsurgical changes from right nephrectomy. Unremarkable left kidney. 3. Fat containing  left inguinal hernia. 4.  Aortic Atherosclerosis (ICD10-I70.0). Electronically Signed   By: Randa Ngo M.D.   On: 03/28/2021 23:07   DG Chest 2 View  Result Date: 03/28/2021 CLINICAL DATA:  Chest pain EXAM:  CHEST - 2 VIEW COMPARISON:  07/27/2019 FINDINGS: The heart size and mediastinal contours are within normal limits. Both lungs are clear. The visualized skeletal structures are unremarkable. IMPRESSION: No active cardiopulmonary disease. Electronically Signed   By: Ulyses Jarred M.D.   On: 03/28/2021 23:03    Procedures Procedures   Medications Ordered in ED Medications - No data to display  ED Course  I have reviewed the triage vital signs and the nursing notes.  Pertinent labs & imaging results that were available during my care of the patient were reviewed by me and considered in my medical decision making (see chart for details).    MDM Rules/Calculators/A&P                          Patient here with complaints of rectal bleeding.  Symptoms started earlier today.  She has associated body aches.  Denies any fever or vomiting.  Differential diagnosis includes diverticulitis, lower GI bleed, hemorrhoid  Laboratory work-up ordered in triage is reassuring.  No significant anemia.  BUN is normal.  CT scan shows diverticulosis, but no evidence of diverticulitis.  Hemoccult is Negative.  Blood seen by patient might have been hemorrhoidal bleeding.  Case discussed with Dr. Christy Gentles, who agrees with plan to obs patient for a few hours and repeat HGB. She has good follow-up and could reasonably be discharged home if HGB is stable.  Repeat HGB is 13.9.  Hemoccult negative.  VSS.  Will discharge and plan for close outpatient follow-up.  Final Clinical Impression(s) / ED Diagnoses Final diagnoses:  Rectal bleeding    Rx / DC Orders ED Discharge Orders    None       Montine Circle, PA-C 03/29/21 [id number]    Ripley Fraise, MD 03/29/21 [phone]

## 2021-03-29 NOTE — ED Notes (Signed)
Family at bedside. 

## 2021-03-29 NOTE — ED Notes (Signed)
Patient verbalized understanding of discharge instructions. Opportunity for questions and answers.  

## 2021-04-18 ENCOUNTER — Ambulatory Visit: Payer: Medicare Other | Admitting: Gastroenterology

## 2021-04-18 ENCOUNTER — Encounter: Payer: Self-pay | Admitting: Gastroenterology

## 2021-04-18 VITALS — BP 160/92 | HR 72 | Ht 64.25 in | Wt 180.0 lb

## 2021-04-18 DIAGNOSIS — R1030 Lower abdominal pain, unspecified: Secondary | ICD-10-CM | POA: Diagnosis not present

## 2021-04-18 DIAGNOSIS — K219 Gastro-esophageal reflux disease without esophagitis: Secondary | ICD-10-CM | POA: Diagnosis not present

## 2021-04-18 MED ORDER — DICYCLOMINE HCL 10 MG PO CAPS
10.0000 mg | ORAL_CAPSULE | Freq: Two times a day (BID) | ORAL | 5 refills | Status: DC
Start: 2021-04-18 — End: 2022-01-31

## 2021-04-18 MED ORDER — PANTOPRAZOLE SODIUM 40 MG PO TBEC: 40.0000 mg | DELAYED_RELEASE_TABLET | Freq: Every day | ORAL | 5 refills | Status: DC

## 2021-04-18 NOTE — Patient Instructions (Signed)
If you are age 83 or older, your body mass index should be between 23-30. Your Body mass index is 30.66 kg/m. If this is out of the aforementioned range listed, please consider follow up with your Primary Care Provider.  If you are age 72 or younger, your body mass index should be between 19-25. Your Body mass index is 30.66 kg/m. If this is out of the aformentioned range listed, please consider follow up with your Primary Care Provider.   We have sent the following medications to your pharmacy for you to pick up at your convenience: Pantoprazole 40 mg daily 30-60 minutes before breakfast. Bentyl 10 mg twice daily.   Follow up with Dr. Tarri Glenn

## 2021-04-18 NOTE — Progress Notes (Signed)
04/18/2021 Sherri Burns [id number] 1935/12/22   HISTORY OF PRESENT ILLNESS: This is an 85 year old female who is a patient of Dr. Tarri Glenn.  She is here today with several complaints, several which are non-GI related.  She complains of headaches, fatigue, back pain.  She has concerns about a mole on her neck and says that the area gets swollen and then causes pain up into her neck and the side of her head.  She has an appointment with her PCP in 2 days.  GI symptoms consist of intermittent rectal bleeding.  She has had this periodically over the years.  She says that when it occurs it is seen as blood in the toilet bowl.  She went to the emergency department earlier this month at which time CT scan of the abdomen and pelvis without contrast did not show any acute issues.  She was actually occult blood negative.  CBC, lipase, CMP were all unremarkable.  She says she takes MiraLAX daily and that helps her move her bowels well.  She complains of a lot of acid reflux.  She is on pantoprazole 20 mg daily.  Previous GI evaluation:  - EGD 03/28/2015 ([facility]) for epigastric pain despite omeprazole 40 mg daily and solid food dysphagia: normal esophagus, multiple fundic gland polyps, gastritis.  H. pylori CLOtest was negative. Dlated with 17 mm and 18 mm savory dilators with no resistance. She notes that she had no dysphagia at that time.  - Colonoscopy 03/12/2011 ([facility]): 2 tubular adenomas on the pathology result.  I am unable to find the procedure report. - Colonoscopy 03/28/2015 ([facility]) for history of colon polyps and family history of colon cancer in her mother:  A small polyp was seen.  Diverticulosis was present.  Surveillance recommended in 5 years. - Colonoscopy 10/23/2016 ([facility]) for rectal bleeding, personal history of colon polyps, and family history of colon cancer revealed 4 small polyps, sigmoid diverticulosis, and internal hemorrhoids - EGD 07/14/19:  Normal esophagus. Nodular mucosa in the  gastric body of unclear etiology. Fundic gland polyps. - Colonoscopy 07/14/19:  10 small polyps, internal hemorrhoids   Past Medical History:  Diagnosis Date  . Abnormal EKG 02/07/2016   Inferolateral T wave inversion and ST depression.  . Acute calculous cholecystitis   . Anxiety   . Arthritis   . Chronic diastolic CHF (congestive heart failure) ([place]) 02/21/2018  . Colon polyps 11/2008   tubular adenoma  . Diabetes mellitus   . Esophageal problem    esophageal dilation  . GERD (gastroesophageal reflux disease)    + hpylori  . GI bleeding 01/2018  . Headache   . Hyperlipidemia   . Hypertension   . Hypothyroidism   . Ischemic colitis ([place])   . Renal cancer, right ([place]) 2010   s/p Rt nephrectomy by Dr. Rosana Hoes  . Renal insufficiency    Past Surgical History:  Procedure Laterality Date  . ABDOMINAL HYSTERECTOMY  1978   partial  . CATARACT EXTRACTION    . CHOLECYSTECTOMY N/A 04/04/2016   Procedure: LAPAROSCOPIC CHOLECYSTECTOMY;  Surgeon: Autumn Messing III, MD;  Location: [place];  Service: General;  Laterality: N/A;  . COLONOSCOPY    . ESOPHAGEAL DILATION  2016  . HERNIA REPAIR    . LAPAROSCOPIC CHOLECYSTECTOMY  04/04/2016  . NEPHRECTOMY Right 2010   Dr. Rosana Hoes, urology, due to renal cancer    reports that she has been smoking cigarettes. She has a 13.75 pack-year smoking history. She has never used smokeless tobacco.  She reports that she does not drink alcohol and does not use drugs. family history includes Cancer in her father, mother, and sister; Heart attack in her brother; Suicidality in her son. Allergies  Allergen Reactions  . Ace Inhibitors Swelling    See 02/16/19   . Codeine Other (See Comments)    hallucinations  . Januvia [Sitagliptin] Diarrhea  . Metformin And Related     Upset stomach  . Ciprocin-Fluocin-Procin [Fluocinolone Acetonide] Other (See Comments)    Unknown reaction  . Clonidine Derivatives Other (See Comments)    Dry mouth  . Crestor [Rosuvastatin  Calcium] Other (See Comments)    cramps  . Penicillins Other (See Comments)    Has patient had a PCN reaction causing immediate rash, facial/tongue/throat swelling, SOB or lightheadedness with hypotension: no Has patient had a PCN reaction causing severe rash involving mucus membranes or skin necrosis: no Has patient had a PCN reaction that required hospitalization : no Has patient had a PCN reaction occurring within the last 10 years: no -Hallucinates If all of the above answers are "NO", then may proceed with Cephalosporin use.   Lavella Lemons Perles Other (See Comments)    Gi upset   . Zocor [Simvastatin] Other (See Comments)    Upset stomach       Outpatient Encounter Medications as of 04/18/2021  Medication Sig  . acetaminophen (TYLENOL) 325 MG tablet Take 2 tablets (650 mg total) by mouth every 6 (six) hours as needed for mild pain (or Fever >/= 101). (Patient taking differently: Take 325 mg by mouth every 6 (six) hours as needed for mild pain.)  . acetic acid 2 % otic solution Place 4 drops into both ears 3 (three) times daily. (Patient taking differently: Place 4 drops into both ears 3 (three) times daily as needed (wax bulidup).)  . allopurinol (ZYLOPRIM) 100 MG tablet Take 1 tablet (100 mg total) by mouth daily. (Patient taking differently: Take 50 mg by mouth daily.)  . amLODipine (NORVASC) 5 MG tablet Take 1 tablet (5 mg total) by mouth daily.  . brimonidine (ALPHAGAN) 0.2 % ophthalmic solution Place 1 drop into the left eye 2 (two) times daily as needed (Dry eyes).  . cholecalciferol (VITAMIN D3) 25 MCG (1000 UNIT) tablet Take 1,000 Units by mouth daily.  . clonazePAM (KLONOPIN) 1 MG tablet TAKE HALF TABLET BY MOUTH EVERY MORNING AND TAKE ONE TABLET BY MOUTH AT BEDTIME (Patient taking differently: Take 0.5-1 mg by mouth See admin instructions. 0.5 mg in the morning and 1 mg at bedtime.)  . diclofenac Sodium (VOLTAREN) 1 % GEL Apply 2 g topically 3 (three) times daily as needed  (for joint pain).  . fluticasone (FLONASE) 50 MCG/ACT nasal spray Place 1-2 sprays into both nostrils daily. (Patient taking differently: Place 1-2 sprays into both nostrils daily as needed for allergies.)  . furosemide (LASIX) 40 MG tablet Take 0.5 tablets (20 mg total) by mouth daily.  Marland Kitchen glipiZIDE (GLUCOTROL) 10 MG tablet Take 20 mg by mouth 2 (two) times daily before a meal.  . levothyroxine (SYNTHROID) 75 MCG tablet Take 1 tablet (75 mcg total) by mouth daily before breakfast.  . metoprolol tartrate (LOPRESSOR) 100 MG tablet Take 1 tablet (100 mg total) by mouth 2 (two) times daily.  . ondansetron (ZOFRAN) 4 MG tablet Take 1 tablet (4 mg total) by mouth every 6 (six) hours as needed for nausea.  . pantoprazole (PROTONIX) 20 MG tablet Take 1 tablet (20 mg total) by mouth daily.  Marland Kitchen  polyethylene glycol (MIRALAX / GLYCOLAX) 17 g packet Take 17 g by mouth daily as needed for mild constipation.  . vitamin E 100 UNIT capsule Take 100 Units by mouth daily.  . [DISCONTINUED] dapagliflozin propanediol (FARXIGA) 5 MG TABS tablet Take 1 tablet (5 mg total) by mouth daily before breakfast. (Patient not taking: No sig reported)  . [DISCONTINUED] famotidine (PEPCID) 20 MG tablet Take 1 tablet (20 mg total) by mouth 2 (two) times daily.  . [DISCONTINUED] glipiZIDE (GLUCOTROL) 10 MG tablet Take 0.5 tablets (5 mg total) by mouth daily before breakfast. (Patient taking differently: Take 20 mg by mouth 2 (two) times daily before a meal.)   No facility-administered encounter medications on file as of 04/18/2021.     REVIEW OF SYSTEMS  : All other systems reviewed and negative except where noted in the History of Present Illness.   PHYSICAL EXAM: BP (!) 160/92 (BP Location: Left Arm, Patient Position: Sitting, Cuff Size: Normal)   Pulse 72   Ht 5' 4.25" (1.632 m) Comment: height measured without shoes  Wt 180 lb (81.6 kg)   BMI 30.66 kg/m  General: Well developed AA female in no acute distress Head:  Normocephalic and atraumatic Eyes:  Sclerae anicteric, conjunctiva pink. Ears: Normal auditory acuity Lungs: Clear throughout to auscultation; no W/R/R. Heart: Regular rate and rhythm; no M/R/G Abdomen: Soft, non-distended.  BS present.  Mild diffuse TTP. Musculoskeletal: Symmetrical with no gross deformities  Skin: No lesions on visible extremities Extremities: No edema  Neurological: Alert oriented x 4, grossly non-focal Psychological:  Alert and cooperative. Normal mood and affect  ASSESSMENT AND PLAN: *GERD:  Will increase pantoprazole to 40 mg daily.  New prescription sent to pharmacy. *Generalized abdominal pain:  Likely intestinal spasm/cramping.  CT scan earlier this month ok.  Will try Bentyl 10 mg BID.  Prescription sent to pharmacy. *Intermittent rectal bleeding: Occurs periodically with an episode of red blood in the toilet bowl.  Likely due to her hemorrhoids or very self-limited, low-volume diverticular bleeds.  Reassurance given. *Non-GI concerns to be addressed with her PCP at upcoming visit in 2 days.  **She will follow-up in 6 weeks for reassessment.   CC:  No ref. provider found

## 2021-04-20 NOTE — Progress Notes (Signed)
Reviewed and agree with management plans. ? ?Lexxie Winberg L. Dwayne Bulkley, MD, MPH  ?

## 2021-06-09 ENCOUNTER — Ambulatory Visit: Payer: Self-pay | Admitting: Gastroenterology

## 2021-06-13 ENCOUNTER — Emergency Department (HOSPITAL_COMMUNITY)
Admission: EM | Admit: 2021-06-13 | Discharge: 2021-06-13 | Disposition: A | Discharge status: Home / Self Care | Payer: Medicare Other | Attending: Emergency Medicine | Admitting: Emergency Medicine

## 2021-06-13 DIAGNOSIS — Z85528 Personal history of other malignant neoplasm of kidney: Secondary | ICD-10-CM | POA: Diagnosis not present

## 2021-06-13 DIAGNOSIS — I5032 Chronic diastolic (congestive) heart failure: Secondary | ICD-10-CM | POA: Diagnosis not present

## 2021-06-13 DIAGNOSIS — N183 Chronic kidney disease, stage 3 unspecified: Secondary | ICD-10-CM | POA: Diagnosis not present

## 2021-06-13 DIAGNOSIS — I130 Hypertensive heart and chronic kidney disease with heart failure and stage 1 through stage 4 chronic kidney disease, or unspecified chronic kidney disease: Secondary | ICD-10-CM | POA: Insufficient documentation

## 2021-06-13 DIAGNOSIS — E039 Hypothyroidism, unspecified: Secondary | ICD-10-CM | POA: Diagnosis not present

## 2021-06-13 DIAGNOSIS — E1122 Type 2 diabetes mellitus with diabetic chronic kidney disease: Secondary | ICD-10-CM | POA: Insufficient documentation

## 2021-06-13 DIAGNOSIS — E785 Hyperlipidemia, unspecified: Secondary | ICD-10-CM | POA: Insufficient documentation

## 2021-06-13 DIAGNOSIS — E1136 Type 2 diabetes mellitus with diabetic cataract: Secondary | ICD-10-CM | POA: Diagnosis not present

## 2021-06-13 DIAGNOSIS — Z7982 Long term (current) use of aspirin: Secondary | ICD-10-CM | POA: Diagnosis not present

## 2021-06-13 DIAGNOSIS — L299 Pruritus, unspecified: Secondary | ICD-10-CM | POA: Insufficient documentation

## 2021-06-13 DIAGNOSIS — E1169 Type 2 diabetes mellitus with other specified complication: Secondary | ICD-10-CM | POA: Insufficient documentation

## 2021-06-13 DIAGNOSIS — F17210 Nicotine dependence, cigarettes, uncomplicated: Secondary | ICD-10-CM | POA: Diagnosis not present

## 2021-06-13 DIAGNOSIS — E1142 Type 2 diabetes mellitus with diabetic polyneuropathy: Secondary | ICD-10-CM | POA: Insufficient documentation

## 2021-06-13 DIAGNOSIS — Z7984 Long term (current) use of oral hypoglycemic drugs: Secondary | ICD-10-CM | POA: Diagnosis not present

## 2021-06-13 DIAGNOSIS — Z79899 Other long term (current) drug therapy: Secondary | ICD-10-CM | POA: Diagnosis not present

## 2021-06-13 DIAGNOSIS — R21 Rash and other nonspecific skin eruption: Secondary | ICD-10-CM | POA: Diagnosis present

## 2021-06-13 MED ORDER — DIPHENHYDRAMINE HCL 25 MG PO CAPS
25.0000 mg | ORAL_CAPSULE | Freq: Once | ORAL | Status: AC
  Administered 2021-06-13: 25 mg via ORAL
  Filled 2021-06-13: qty 1

## 2021-06-13 NOTE — ED Triage Notes (Signed)
Pt here POV with c/o medication reaction. Pt states she started Tramadol yesterday. Pt states she broke out in a rash and swelling all over body. No rash or swelling noted in triage. No respiratory distress

## 2021-06-13 NOTE — ED Provider Notes (Addendum)
[place] EMERGENCY DEPARTMENT Provider Note   CSN: [id number] Arrival date & time: 06/13/21  0175     History Chief Complaint  Patient presents with   Allergic Reaction    Sherri Burns is a 85 y.o. female.  Pt reports he stopped smoking 3 weeks ago and has had trouble sleeping.  Pt reports she was on klonopin for years and her MD switched her to Trazadone.  Pt reports she only took half but she has started itching and developed a rash.  Pt reports she used antibiotic ointment and it helped.  Pt reports she also has been eating and drinking less.    The history is provided by the patient. No language interpreter was used.  Allergic Reaction Presenting symptoms: itching and rash   Severity:  Mild Duration:  1 day Context: medications   Relieved by:  Nothing Worsened by:  Nothing Ineffective treatments:  None tried     Past Medical History:  Diagnosis Date   Abnormal EKG 02/07/2016   Inferolateral T wave inversion and ST depression.   Acute calculous cholecystitis    Anxiety    Arthritis    Chronic diastolic CHF (congestive heart failure) ([place]) 02/21/2018   Colon polyps 11/2008   tubular adenoma   Diabetes mellitus    Esophageal problem    esophageal dilation   GERD (gastroesophageal reflux disease)    + hpylori   GI bleeding 01/2018   Headache    Hyperlipidemia    Hypertension    Hypothyroidism    Ischemic colitis [facility])    Renal cancer, right ([place]) 2010   s/p Rt nephrectomy by Dr. Rosana Hoes   Renal insufficiency     Patient Active Problem List   Diagnosis Date Noted   Lower abdominal pain 04/18/2021   Pressure injury of right heel, unstageable ([place]) 12/06/2020   Lower GI bleed 05/31/2020   Statin intolerance 11/12/2019   GAD (generalized anxiety disorder) 08/07/2019   Long term prescription benzodiazepine use 08/07/2019   GI bleed 04/10/2019   S/p nephrectomy 06/24/2018   Chronic diastolic CHF (congestive heart failure) ([place])  02/21/2018   Acute lower GI bleeding 02/20/2018   Hyperlipidemia    Abnormal EKG 02/07/2016   Type 2 diabetes mellitus with diabetic polyneuropathy, without long-term current use of insulin ([place]) 09/23/2012   Essential hypertension    Gastroesophageal reflux disease without esophagitis    Hypothyroidism    CKD (chronic kidney disease), stage III ([place])     Past Surgical History:  Procedure Laterality Date   ABDOMINAL HYSTERECTOMY  1978   partial   CATARACT EXTRACTION     CHOLECYSTECTOMY N/A 04/04/2016   Procedure: LAPAROSCOPIC CHOLECYSTECTOMY;  Surgeon: Autumn Messing III, MD;  Location: [place];  Service: General;  Laterality: N/A;   COLONOSCOPY     ESOPHAGEAL DILATION  2016   HERNIA REPAIR     LAPAROSCOPIC CHOLECYSTECTOMY  04/04/2016   NEPHRECTOMY Right 2010   Dr. Rosana Hoes, urology, due to renal cancer     OB History   No obstetric history on file.     Family History  Problem Relation Age of Onset   Cancer Mother        colon, kidney cancer   Cancer Father        throat cancer   Suicidality Son    Cancer Sister    Heart attack Brother     Social History   Tobacco Use   Smoking status: Some Days    Packs/day:  0.25    Years: 55.00    Pack years: 13.75    Types: Cigarettes   Smokeless tobacco: Never   Tobacco comments:    cut back amount still  trying to quit  Vaping Use   Vaping Use: Never used  Substance Use Topics   Alcohol use: No    Alcohol/week: 0.0 standard drinks   Drug use: No    Home Medications Prior to Admission medications   Medication Sig Start Date End Date Taking? Authorizing Provider  acetaminophen (TYLENOL) 325 MG tablet Take 2 tablets (650 mg total) by mouth every 6 (six) hours as needed for mild pain (or Fever >/= 101). Patient taking differently: Take 325 mg by mouth every 6 (six) hours as needed for mild pain. 06/02/20   Allie Bossier, MD  acetic acid 2 % otic solution Place 4 drops into both ears 3 (three) times daily. Patient taking  differently: Place 4 drops into both ears 3 (three) times daily as needed (wax bulidup). 09/17/19   Jacelyn Pi, Lilia Argue, MD  allopurinol (ZYLOPRIM) 100 MG tablet Take 1 tablet (100 mg total) by mouth daily. Patient taking differently: Take 50 mg by mouth daily. 11/11/20   Trula Slade, DPM  amLODipine (NORVASC) 5 MG tablet Take 1 tablet (5 mg total) by mouth daily. 12/06/20   Just, Laurita Quint, FNP  brimonidine (ALPHAGAN) 0.2 % ophthalmic solution Place 1 drop into the left eye 2 (two) times daily as needed (Dry eyes). 10/27/19   [provider]  cholecalciferol (VITAMIN D3) 25 MCG (1000 UNIT) tablet Take 1,000 Units by mouth daily.    [provider]  clonazePAM (KLONOPIN) 1 MG tablet TAKE HALF TABLET BY MOUTH EVERY MORNING AND TAKE ONE TABLET BY MOUTH AT BEDTIME Patient taking differently: Take 0.5-1 mg by mouth See admin instructions. 0.5 mg in the morning and 1 mg at bedtime. 12/14/20   Just, Laurita Quint, FNP  diclofenac Sodium (VOLTAREN) 1 % GEL Apply 2 g topically 3 (three) times daily as needed (for joint pain). 09/13/20   Nuala Alpha A, PA-C  dicyclomine (BENTYL) 10 MG capsule Take 1 capsule (10 mg total) by mouth 2 (two) times daily. 04/18/21   Zehr, Laban Emperor, PA-C  fluticasone (FLONASE) 50 MCG/ACT nasal spray Place 1-2 sprays into both nostrils daily. Patient taking differently: Place 1-2 sprays into both nostrils daily as needed for allergies. 06/17/20   Daleen Squibb, MD  furosemide (LASIX) 40 MG tablet Take 0.5 tablets (20 mg total) by mouth daily. 08/08/20   Jacelyn Pi, Lilia Argue, MD  glipiZIDE (GLUCOTROL) 10 MG tablet Take 20 mg by mouth 2 (two) times daily before a meal.    [provider]  levothyroxine (SYNTHROID) 75 MCG tablet Take 1 tablet (75 mcg total) by mouth daily before breakfast. 08/18/20   Jacelyn Pi, Lilia Argue, MD  metoprolol tartrate (LOPRESSOR) 100 MG tablet Take 1 tablet (100 mg total) by mouth 2 (two) times daily. 06/16/20   Daleen Squibb, MD  ondansetron (ZOFRAN) 4 MG tablet Take 1 tablet (4 mg total) by mouth every 6 (six) hours as needed for nausea. 06/02/20   Allie Bossier, MD  pantoprazole (PROTONIX) 40 MG tablet Take 1 tablet (40 mg total) by mouth daily. 04/18/21   Zehr, Laban Emperor, PA-C  polyethylene glycol (MIRALAX / GLYCOLAX) 17 g packet Take 17 g by mouth daily as needed for mild constipation. 06/02/20   Allie Bossier, MD  vitamin  E 100 UNIT capsule Take 100 Units by mouth daily.    [provider]    Allergies    Ace inhibitors, Codeine, Januvia [sitagliptin], Metformin and related, Ciprocin-fluocin-procin [fluocinolone acetonide], Clonidine derivatives, Crestor [rosuvastatin calcium], Penicillins, Tessalon perles, and Zocor [simvastatin]  Review of Systems   Review of Systems  Constitutional:  Negative for chills.  HENT:  Negative for sore throat.   Skin:  Positive for itching and rash. Negative for color change.  All other systems reviewed and are negative.  Physical Exam Updated Vital Signs BP (!) 167/78   Pulse 61   Temp 98.3 F (36.8 C)   Resp 19   Ht 5\' 6"  (1.676 m)   Wt 78.9 kg   SpO2 99%   BMI 28.08 kg/m   Physical Exam Vitals and nursing note reviewed.  Constitutional:      Appearance: She is well-developed.  HENT:     Head: Normocephalic.  Cardiovascular:     Rate and Rhythm: Normal rate.  Pulmonary:     Effort: Pulmonary effort is normal.  Abdominal:     General: There is no distension.  Musculoskeletal:        General: Normal range of motion.     Cervical back: Normal range of motion.  Skin:    General: Skin is warm.     Findings: No rash.  Neurological:     Mental Status: She is alert and oriented to person, place, and time.  Psychiatric:        Mood and Affect: Mood normal.    ED Results / Procedures / Treatments   Labs (all labs ordered are listed, but only abnormal results are displayed) Labs Reviewed - No data to  display  EKG None  Radiology No results found.  Procedures Procedures   Medications Ordered in ED Medications - No data to display  ED Course  I have reviewed the triage vital signs and the nursing notes.  Pertinent labs & imaging results that were available during my care of the patient were reviewed by me and considered in my medical decision making (see chart for details).    MDM Rules/Calculators/A&P                          MDM:  No evidence of allergic reaction.  No rash,  Pt advised I will give her benadryl.  Pt advised to stop trazadone.  (Pt reports she does not have anymore)  Final Clinical Impression(s) / ED Diagnoses Final diagnoses:  Rash    Rx / DC Orders ED Discharge Orders     None     An After Visit Summary was printed and given to the patient.    Fransico Meadow, PA-C 06/13/21 1417    Fransico Meadow, PA-C 06/13/21 1506    Milton Ferguson, MD 06/15/21 1020

## 2021-06-13 NOTE — Discharge Instructions (Addendum)
Take benadryl for itching

## 2021-06-15 ENCOUNTER — Emergency Department (HOSPITAL_COMMUNITY): Payer: Medicare Other

## 2021-06-15 ENCOUNTER — Encounter (HOSPITAL_COMMUNITY): Payer: Self-pay | Admitting: Emergency Medicine

## 2021-06-15 ENCOUNTER — Emergency Department (HOSPITAL_COMMUNITY)
Admission: EM | Admit: 2021-06-15 | Discharge: 2021-06-16 | Disposition: A | Discharge status: Home / Self Care | Payer: Medicare Other | Attending: Emergency Medicine | Admitting: Emergency Medicine

## 2021-06-15 ENCOUNTER — Other Ambulatory Visit: Payer: Self-pay

## 2021-06-15 DIAGNOSIS — Z7984 Long term (current) use of oral hypoglycemic drugs: Secondary | ICD-10-CM | POA: Insufficient documentation

## 2021-06-15 DIAGNOSIS — R9431 Abnormal electrocardiogram [ECG] [EKG]: Secondary | ICD-10-CM | POA: Diagnosis not present

## 2021-06-15 DIAGNOSIS — Z79899 Other long term (current) drug therapy: Secondary | ICD-10-CM | POA: Insufficient documentation

## 2021-06-15 DIAGNOSIS — R519 Headache, unspecified: Secondary | ICD-10-CM | POA: Diagnosis present

## 2021-06-15 DIAGNOSIS — W109XXA Fall (on) (from) unspecified stairs and steps, initial encounter: Secondary | ICD-10-CM | POA: Diagnosis not present

## 2021-06-15 DIAGNOSIS — E1122 Type 2 diabetes mellitus with diabetic chronic kidney disease: Secondary | ICD-10-CM | POA: Diagnosis not present

## 2021-06-15 DIAGNOSIS — I130 Hypertensive heart and chronic kidney disease with heart failure and stage 1 through stage 4 chronic kidney disease, or unspecified chronic kidney disease: Secondary | ICD-10-CM | POA: Diagnosis not present

## 2021-06-15 DIAGNOSIS — Y9289 Other specified places as the place of occurrence of the external cause: Secondary | ICD-10-CM | POA: Diagnosis not present

## 2021-06-15 DIAGNOSIS — N183 Chronic kidney disease, stage 3 unspecified: Secondary | ICD-10-CM | POA: Diagnosis not present

## 2021-06-15 DIAGNOSIS — I5032 Chronic diastolic (congestive) heart failure: Secondary | ICD-10-CM | POA: Diagnosis not present

## 2021-06-15 DIAGNOSIS — M25511 Pain in right shoulder: Secondary | ICD-10-CM | POA: Diagnosis not present

## 2021-06-15 DIAGNOSIS — W19XXXA Unspecified fall, initial encounter: Secondary | ICD-10-CM

## 2021-06-15 DIAGNOSIS — E039 Hypothyroidism, unspecified: Secondary | ICD-10-CM | POA: Diagnosis not present

## 2021-06-15 DIAGNOSIS — Z85528 Personal history of other malignant neoplasm of kidney: Secondary | ICD-10-CM | POA: Insufficient documentation

## 2021-06-15 DIAGNOSIS — F17210 Nicotine dependence, cigarettes, uncomplicated: Secondary | ICD-10-CM | POA: Insufficient documentation

## 2021-06-15 DIAGNOSIS — M1990 Unspecified osteoarthritis, unspecified site: Secondary | ICD-10-CM

## 2021-06-15 NOTE — ED Provider Notes (Signed)
Emergency Medicine Provider Triage Evaluation Note  Sherri Burns , a 85 y.o. female  was evaluated in triage.  Pt complains of a fall.  She states that she fell down 13 stairs at her daughter's house about 3 days ago.  She states that since this occurred she has been having neck pain as well as intermittent headaches.  She is also noticed that her blood pressure has been elevated as well and states that she has been compliant with her hypertension medications.  Denies LOC.  Denies anticoagulation.  Reports diffuse pain to the right side of the body particularly along the right shoulder.  Physical Exam  BP (!) 185/91 (BP Location: Left Arm)   Pulse 77   Temp 99.1 F (37.3 C) (Oral)   Resp 16   Ht 5\' 6"  (1.676 m)   Wt 78.9 kg   SpO2 97%   BMI 28.08 kg/m  Gen:   Awake, no distress   Resp:  Normal effort  MSK:   Moves extremities without difficulty  Other:    Medical Decision Making  Medically screening exam initiated at 10:35 PM.  Appropriate orders placed.  [name] was informed that the remainder of the evaluation will be completed by another provider, this initial triage assessment does not replace that evaluation, and the importance of remaining in the ED until their evaluation is complete.   Rayna Sexton, PA-C 06/15/21 2236    Arnaldo Natal, MD 06/15/21 [id number]

## 2021-06-15 NOTE — ED Triage Notes (Signed)
Patient reports headache and hypertension x1 week. States symptoms have worsened since fall down thirteen stairs this week. Reports hitting head. Denies LOC. Denies taking blood thinners. States pain to left side of body.

## 2021-06-16 ENCOUNTER — Encounter (HOSPITAL_COMMUNITY): Payer: Self-pay | Admitting: Emergency Medicine

## 2021-06-16 MED ORDER — ACETAMINOPHEN 500 MG PO TABS
1000.0000 mg | ORAL_TABLET | Freq: Once | ORAL | Status: AC
  Administered 2021-06-16: 1000 mg via ORAL
  Filled 2021-06-16: qty 2

## 2021-06-16 NOTE — ED Provider Notes (Signed)
[facility] DEPT Provider Note   CSN: [id number] Arrival date & time: 06/15/21  2205     History Chief Complaint  Patient presents with   Fall   Headache    Sherri Burns is a 85 y.o. female.  The history is provided by the patient.  Fall This is a new problem. The current episode started more than 2 days ago (3.5 days ago). The problem occurs rarely. The problem has been resolved. Pertinent negatives include no chest pain and no shortness of breath. Nothing aggravates the symptoms. Nothing relieves the symptoms. She has tried nothing for the symptoms. The treatment provided no relief.  Patient with anxiety and HTN presents with ongoing head and R shoulder pain since falling 3.5 days ago.  States her BP has been high and she has been seen by cardiology for this.  No CP, no SOB, no DOE, no n/v/d.  No abdominal pain.      Past Medical History:  Diagnosis Date   Abnormal EKG 02/07/2016   Inferolateral T wave inversion and ST depression.   Acute calculous cholecystitis    Anxiety    Arthritis    Chronic diastolic CHF (congestive heart failure) ([place]) 02/21/2018   Colon polyps 11/2008   tubular adenoma   Diabetes mellitus    Esophageal problem    esophageal dilation   GERD (gastroesophageal reflux disease)    + hpylori   GI bleeding 01/2018   Headache    Hyperlipidemia    Hypertension    Hypothyroidism    Ischemic colitis [facility])    Renal cancer, right ([place]) 2010   s/p Rt nephrectomy by Dr. Rosana Hoes   Renal insufficiency     Patient Active Problem List   Diagnosis Date Noted   Lower abdominal pain 04/18/2021   Pressure injury of right heel, unstageable ([place]) 12/06/2020   Lower GI bleed 05/31/2020   Statin intolerance 11/12/2019   GAD (generalized anxiety disorder) 08/07/2019   Long term prescription benzodiazepine use 08/07/2019   GI bleed 04/10/2019   S/p nephrectomy 06/24/2018   Chronic diastolic CHF (congestive heart failure) ([place])  02/21/2018   Acute lower GI bleeding 02/20/2018   Hyperlipidemia    Abnormal EKG 02/07/2016   Type 2 diabetes mellitus with diabetic polyneuropathy, without long-term current use of insulin ([place]) 09/23/2012   Essential hypertension    Gastroesophageal reflux disease without esophagitis    Hypothyroidism    CKD (chronic kidney disease), stage III ([place])     Past Surgical History:  Procedure Laterality Date   ABDOMINAL HYSTERECTOMY  1978   partial   CATARACT EXTRACTION     CHOLECYSTECTOMY N/A 04/04/2016   Procedure: LAPAROSCOPIC CHOLECYSTECTOMY;  Surgeon: Autumn Messing III, MD;  Location: [facility];  Service: General;  Laterality: N/A;   COLONOSCOPY     ESOPHAGEAL DILATION  2016   HERNIA REPAIR     LAPAROSCOPIC CHOLECYSTECTOMY  04/04/2016   NEPHRECTOMY Right 2010   Dr. Rosana Hoes, urology, due to renal cancer     OB History   No obstetric history on file.     Family History  Problem Relation Age of Onset   Cancer Mother        colon, kidney cancer   Cancer Father        throat cancer   Suicidality Son    Cancer Sister    Heart attack Brother     Social History   Tobacco Use   Smoking status: Some Days  Packs/day: 0.25    Years: 55.00    Pack years: 13.75    Types: Cigarettes   Smokeless tobacco: Never   Tobacco comments:    cut back amount still  trying to quit  Vaping Use   Vaping Use: Never used  Substance Use Topics   Alcohol use: No    Alcohol/week: 0.0 standard drinks   Drug use: No    Home Medications Prior to Admission medications   Medication Sig Start Date End Date Taking? Authorizing Provider  acetaminophen (TYLENOL) 325 MG tablet Take 2 tablets (650 mg total) by mouth every 6 (six) hours as needed for mild pain (or Fever >/= 101). Patient taking differently: Take 325 mg by mouth every 6 (six) hours as needed for mild pain. 06/02/20   Allie Bossier, MD  acetic acid 2 % otic solution Place 4 drops into both ears 3 (three) times daily. Patient taking  differently: Place 4 drops into both ears 3 (three) times daily as needed (wax bulidup). 09/17/19   Jacelyn Pi, Lilia Argue, MD  allopurinol (ZYLOPRIM) 100 MG tablet Take 1 tablet (100 mg total) by mouth daily. Patient taking differently: Take 50 mg by mouth daily. 11/11/20   Trula Slade, DPM  amLODipine (NORVASC) 5 MG tablet Take 1 tablet (5 mg total) by mouth daily. 12/06/20   Just, Laurita Quint, FNP  brimonidine (ALPHAGAN) 0.2 % ophthalmic solution Place 1 drop into the left eye 2 (two) times daily as needed (Dry eyes). 10/27/19   [provider]  cholecalciferol (VITAMIN D3) 25 MCG (1000 UNIT) tablet Take 1,000 Units by mouth daily.    [provider]  clonazePAM (KLONOPIN) 1 MG tablet TAKE HALF TABLET BY MOUTH EVERY MORNING AND TAKE ONE TABLET BY MOUTH AT BEDTIME Patient taking differently: Take 0.5-1 mg by mouth See admin instructions. 0.5 mg in the morning and 1 mg at bedtime. 12/14/20   Just, Laurita Quint, FNP  diclofenac Sodium (VOLTAREN) 1 % GEL Apply 2 g topically 3 (three) times daily as needed (for joint pain). 09/13/20   Nuala Alpha A, PA-C  dicyclomine (BENTYL) 10 MG capsule Take 1 capsule (10 mg total) by mouth 2 (two) times daily. 04/18/21   Zehr, Laban Emperor, PA-C  fluticasone (FLONASE) 50 MCG/ACT nasal spray Place 1-2 sprays into both nostrils daily. Patient taking differently: Place 1-2 sprays into both nostrils daily as needed for allergies. 06/17/20   Daleen Squibb, MD  furosemide (LASIX) 40 MG tablet Take 0.5 tablets (20 mg total) by mouth daily. 08/08/20   Jacelyn Pi, Lilia Argue, MD  glipiZIDE (GLUCOTROL) 10 MG tablet Take 20 mg by mouth 2 (two) times daily before a meal.    [provider]  levothyroxine (SYNTHROID) 75 MCG tablet Take 1 tablet (75 mcg total) by mouth daily before breakfast. 08/18/20   Jacelyn Pi, Lilia Argue, MD  metoprolol tartrate (LOPRESSOR) 100 MG tablet Take 1 tablet (100 mg total) by mouth 2 (two) times daily. 06/16/20   Daleen Squibb, MD  ondansetron (ZOFRAN) 4 MG tablet Take 1 tablet (4 mg total) by mouth every 6 (six) hours as needed for nausea. 06/02/20   Allie Bossier, MD  pantoprazole (PROTONIX) 40 MG tablet Take 1 tablet (40 mg total) by mouth daily. 04/18/21   Zehr, Laban Emperor, PA-C  polyethylene glycol (MIRALAX / GLYCOLAX) 17 g packet Take 17 g by mouth daily as needed for mild constipation. 06/02/20   Allie Bossier, MD  vitamin E 100 UNIT capsule Take 100 Units by mouth daily.    [provider]    Allergies    Ace inhibitors, Codeine, Januvia [sitagliptin], Metformin and related, Ciprocin-fluocin-procin [fluocinolone acetonide], Clonidine derivatives, Crestor [rosuvastatin calcium], Penicillins, Tessalon perles, and Zocor [simvastatin]  Review of Systems   Review of Systems  Constitutional:  Negative for fever.  HENT:  Negative for facial swelling.   Eyes:  Negative for redness.  Respiratory:  Negative for shortness of breath.   Cardiovascular:  Negative for chest pain and leg swelling.  Gastrointestinal:  Negative for vomiting.  Genitourinary:  Negative for difficulty urinating.  Musculoskeletal:  Positive for arthralgias. Negative for neck stiffness.  Skin:  Negative for wound.  Neurological:  Negative for facial asymmetry, weakness and numbness.  Psychiatric/Behavioral:  Negative for agitation.   All other systems reviewed and are negative.  Physical Exam Updated Vital Signs BP (!) 185/91 (BP Location: Left Arm)   Pulse 77   Temp 99.1 F (37.3 C) (Oral)   Resp 16   Ht '5\' 6"'$  (1.676 m)   Wt 78.9 kg   SpO2 97%   BMI 28.08 kg/m   Physical Exam Vitals and nursing note reviewed.  Constitutional:      General: She is not in acute distress.    Appearance: Normal appearance.  HENT:     Head: Normocephalic and atraumatic.     Nose: Nose normal.  Eyes:     Extraocular Movements: Extraocular movements intact.     Conjunctiva/sclera: Conjunctivae normal.     Pupils: Pupils are  equal, round, and reactive to light.  Cardiovascular:     Rate and Rhythm: Normal rate and regular rhythm.     Pulses: Normal pulses.     Heart sounds: Normal heart sounds.  Pulmonary:     Effort: Pulmonary effort is normal.     Breath sounds: Normal breath sounds.  Abdominal:     General: Abdomen is flat. Bowel sounds are normal.     Palpations: Abdomen is soft.     Tenderness: There is no abdominal tenderness. There is no guarding or rebound.  Musculoskeletal:     Right shoulder: Normal.     Left shoulder: Normal.     Right wrist: Normal. No snuff box tenderness.     Left wrist: Normal. No snuff box tenderness.     Right hand: Normal.     Left hand: Normal.     Cervical back: Normal. No tenderness.     Thoracic back: Normal.     Lumbar back: Normal.     Right hip: Normal.     Left hip: Normal.     Right ankle: Normal.     Right Achilles Tendon: Normal.     Left ankle: Normal.     Left Achilles Tendon: Normal.  Lymphadenopathy:     Cervical: No cervical adenopathy.  Skin:    General: Skin is warm and dry.     Capillary Refill: Capillary refill takes less than 2 seconds.  Neurological:     General: No focal deficit present.     Mental Status: She is alert.     Deep Tendon Reflexes: Reflexes normal.  Psychiatric:        Mood and Affect: Mood normal.        Behavior: Behavior normal.    ED Results / Procedures / Treatments   Labs (all labs ordered are listed, but only abnormal results are displayed) Labs Reviewed - No data to display  EKG None  Radiology DG Shoulder Right  Result Date: 06/15/2021 CLINICAL DATA:  Right shoulder pain after fall. Fall 3 days ago with axillary pain. EXAM: RIGHT SHOULDER - 2+ VIEW COMPARISON:  None. FINDINGS: There is no evidence of fracture or dislocation. Mild acromioclavicular and glenohumeral spurring. Soft tissues are unremarkable. EKG lead overlies the greater tuberosity on the AP view. IMPRESSION: No fracture or dislocation of the  right shoulder. Mild osteoarthritis. Electronically Signed   By: Keith Rake M.D.   On: 06/15/2021 22:55   CT Head Wo Contrast  Result Date: 06/15/2021 CLINICAL DATA:  Fall EXAM: CT HEAD WITHOUT CONTRAST CT CERVICAL SPINE WITHOUT CONTRAST TECHNIQUE: Multidetector CT imaging of the head and cervical spine was performed following the standard protocol without intravenous contrast. Multiplanar CT image reconstructions of the cervical spine were also generated. COMPARISON:  None. FINDINGS: CT HEAD FINDINGS Brain: There is no mass, hemorrhage or extra-axial collection. The size and configuration of the ventricles and extra-axial CSF spaces are normal. The brain parenchyma is normal, without evidence of acute or chronic infarction. Vascular: No abnormal hyperdensity of the major intracranial arteries or dural venous sinuses. No intracranial atherosclerosis. Skull: The visualized skull base, calvarium and extracranial soft tissues are normal. Sinuses/Orbits: No fluid levels or advanced mucosal thickening of the visualized paranasal sinuses. No mastoid or middle ear effusion. The orbits are normal. CT CERVICAL SPINE FINDINGS Alignment: Grade 1 retrolisthesis at C5-6. Facets are aligned. Occipital condyles are normally positioned. Skull base and vertebrae: No acute fracture. Soft tissues and spinal canal: No prevertebral fluid or swelling. No visible canal hematoma. Disc levels: No advanced spinal canal or neural foraminal stenosis. Upper chest: No pneumothorax, pulmonary nodule or pleural effusion. Other: Normal visualized paraspinal cervical soft tissues. IMPRESSION: 1. No acute intracranial abnormality. 2. No acute fracture or static subluxation of the cervical spine. Electronically Signed   By: Ulyses Jarred M.D.   On: 06/15/2021 23:26   CT Cervical Spine Wo Contrast  Result Date: 06/15/2021 CLINICAL DATA:  Fall EXAM: CT HEAD WITHOUT CONTRAST CT CERVICAL SPINE WITHOUT CONTRAST TECHNIQUE: Multidetector CT  imaging of the head and cervical spine was performed following the standard protocol without intravenous contrast. Multiplanar CT image reconstructions of the cervical spine were also generated. COMPARISON:  None. FINDINGS: CT HEAD FINDINGS Brain: There is no mass, hemorrhage or extra-axial collection. The size and configuration of the ventricles and extra-axial CSF spaces are normal. The brain parenchyma is normal, without evidence of acute or chronic infarction. Vascular: No abnormal hyperdensity of the major intracranial arteries or dural venous sinuses. No intracranial atherosclerosis. Skull: The visualized skull base, calvarium and extracranial soft tissues are normal. Sinuses/Orbits: No fluid levels or advanced mucosal thickening of the visualized paranasal sinuses. No mastoid or middle ear effusion. The orbits are normal. CT CERVICAL SPINE FINDINGS Alignment: Grade 1 retrolisthesis at C5-6. Facets are aligned. Occipital condyles are normally positioned. Skull base and vertebrae: No acute fracture. Soft tissues and spinal canal: No prevertebral fluid or swelling. No visible canal hematoma. Disc levels: No advanced spinal canal or neural foraminal stenosis. Upper chest: No pneumothorax, pulmonary nodule or pleural effusion. Other: Normal visualized paraspinal cervical soft tissues. IMPRESSION: 1. No acute intracranial abnormality. 2. No acute fracture or static subluxation of the cervical spine. Electronically Signed   By: Ulyses Jarred M.D.   On: 06/15/2021 23:26    Procedures Procedures   Medications Ordered in ED Medications - No data to display  ED Course  I have reviewed the  triage vital signs and the nursing notes.  Pertinent labs & imaging results that were available during my care of the patient were reviewed by me and considered in my medical decision making (see chart for details).   Informed of abnormal QT on EKG and need for close follow up with cardiology.  Patient verbalizes  understanding and agrees to follow up.  No traumatic injury from fall.  Tylenol for pain and ice areas.    Sherri Burns was evaluated in Emergency Department on 06/16/2021 for the symptoms described in the history of present illness. She was evaluated in the context of the global COVID-19 pandemic, which necessitated consideration that the patient might be at risk for infection with the SARS-CoV-2 virus that causes COVID-19. Institutional protocols and algorithms that pertain to the evaluation of patients at risk for COVID-19 are in a state of rapid change based on information released by regulatory bodies including the CDC and federal and state organizations. These policies and algorithms were followed during the patient's care in the ED.     Final Clinical Impression(s) / ED Diagnoses Final diagnoses:  Fall, initial encounter  Arthritis  Prolonged Q-T interval on ECG   Return for intractable cough, coughing up blood, fevers > 100.4 unrelieved by medication, shortness of breath, intractable vomiting, chest pain, shortness of breath, weakness, numbness, changes in speech, facial asymmetry, abdominal pain, passing out, Inability to tolerate liquids or food, cough, altered mental status or any concerns. No signs of systemic illness or infection. The patient is nontoxic-appearing on exam and vital signs are within normal limits. I have reviewed the triage vital signs and the nursing notes. Pertinent labs & imaging results that were available during my care of the patient were reviewed by me and considered in my medical decision making (see chart for details). After history, exam, and medical workup I feel the patient has been appropriately medically screened and is safe for discharge home. Pertinent diagnoses were discussed with the patient. Patient was given return precautions.  Rx / DC Orders ED Discharge Orders     None        Marilea Gwynne, MD 06/16/21 [phone]

## 2021-06-16 NOTE — ED Notes (Signed)
Daughter called/ she will come pick up pt.

## 2021-06-20 ENCOUNTER — Other Ambulatory Visit: Payer: Self-pay

## 2021-06-20 ENCOUNTER — Emergency Department (HOSPITAL_COMMUNITY): Payer: Medicare Other

## 2021-06-20 ENCOUNTER — Encounter (HOSPITAL_COMMUNITY): Payer: Self-pay

## 2021-06-20 ENCOUNTER — Emergency Department (HOSPITAL_COMMUNITY)
Admission: EM | Admit: 2021-06-20 | Discharge: 2021-06-20 | Disposition: A | Discharge status: Home / Self Care | Payer: Medicare Other | Attending: Emergency Medicine | Admitting: Emergency Medicine

## 2021-06-20 DIAGNOSIS — Z79899 Other long term (current) drug therapy: Secondary | ICD-10-CM | POA: Diagnosis not present

## 2021-06-20 DIAGNOSIS — Z7984 Long term (current) use of oral hypoglycemic drugs: Secondary | ICD-10-CM | POA: Diagnosis not present

## 2021-06-20 DIAGNOSIS — E785 Hyperlipidemia, unspecified: Secondary | ICD-10-CM | POA: Diagnosis not present

## 2021-06-20 DIAGNOSIS — E039 Hypothyroidism, unspecified: Secondary | ICD-10-CM | POA: Insufficient documentation

## 2021-06-20 DIAGNOSIS — I130 Hypertensive heart and chronic kidney disease with heart failure and stage 1 through stage 4 chronic kidney disease, or unspecified chronic kidney disease: Secondary | ICD-10-CM | POA: Diagnosis not present

## 2021-06-20 DIAGNOSIS — F17210 Nicotine dependence, cigarettes, uncomplicated: Secondary | ICD-10-CM | POA: Insufficient documentation

## 2021-06-20 DIAGNOSIS — I5032 Chronic diastolic (congestive) heart failure: Secondary | ICD-10-CM | POA: Insufficient documentation

## 2021-06-20 DIAGNOSIS — E1169 Type 2 diabetes mellitus with other specified complication: Secondary | ICD-10-CM | POA: Insufficient documentation

## 2021-06-20 DIAGNOSIS — N183 Chronic kidney disease, stage 3 unspecified: Secondary | ICD-10-CM | POA: Insufficient documentation

## 2021-06-20 DIAGNOSIS — E1122 Type 2 diabetes mellitus with diabetic chronic kidney disease: Secondary | ICD-10-CM | POA: Diagnosis not present

## 2021-06-20 DIAGNOSIS — J029 Acute pharyngitis, unspecified: Secondary | ICD-10-CM | POA: Diagnosis present

## 2021-06-20 DIAGNOSIS — R079 Chest pain, unspecified: Secondary | ICD-10-CM | POA: Insufficient documentation

## 2021-06-20 DIAGNOSIS — R350 Frequency of micturition: Secondary | ICD-10-CM | POA: Diagnosis not present

## 2021-06-20 LAB — COMPREHENSIVE METABOLIC PANEL
ALT: 18 U/L (ref 0–44)
AST: 19 U/L (ref 15–41)
Albumin: 4.3 g/dL (ref 3.5–5.0)
Alkaline Phosphatase: 45 U/L (ref 38–126)
Anion gap: 8 (ref 5–15)
BUN: 12 mg/dL (ref 8–23)
CO2: 28 mmol/L (ref 22–32)
Calcium: 9.7 mg/dL (ref 8.9–10.3)
Chloride: 106 mmol/L (ref 98–111)
Creatinine, Ser: 1.11 mg/dL — ABNORMAL HIGH (ref 0.44–1.00)
GFR, Estimated: 50 mL/min — ABNORMAL LOW (ref >60)
Glucose, Bld: 154 mg/dL — ABNORMAL HIGH (ref 70–99)
Potassium: 3.5 mmol/L (ref 3.5–5.1)
Sodium: 142 mmol/L (ref 135–145)
Total Bilirubin: 0.9 mg/dL (ref 0.3–1.2)
Total Protein: 7.5 g/dL (ref 6.5–8.1)

## 2021-06-20 LAB — CBC
HCT: 41.6 % (ref 36.0–46.0)
Hemoglobin: 13.4 g/dL (ref 12.0–15.0)
MCH: 31.4 pg (ref 26.0–34.0)
MCHC: 32.2 g/dL (ref 30.0–36.0)
MCV: 97.4 fL (ref 80.0–100.0)
Platelets: 203 10*3/uL (ref 150–400)
RBC: 4.27 MIL/uL (ref 3.87–5.11)
RDW: 14.3 % (ref 11.5–15.5)
WBC: 6.2 10*3/uL (ref 4.0–10.5)
nRBC: 0 % (ref 0.0–0.2)

## 2021-06-20 LAB — URINALYSIS, ROUTINE W REFLEX MICROSCOPIC
Bilirubin Urine: NEGATIVE
Glucose, UA: NEGATIVE mg/dL
Hgb urine dipstick: NEGATIVE
Ketones, ur: NEGATIVE mg/dL
Leukocytes,Ua: NEGATIVE
Nitrite: NEGATIVE
Protein, ur: NEGATIVE mg/dL
Specific Gravity, Urine: 1.012 (ref 1.005–1.030)
pH: 7 (ref 5.0–8.0)

## 2021-06-20 LAB — TYPE AND SCREEN
ABO/RH(D): O POS
Antibody Screen: NEGATIVE

## 2021-06-20 LAB — GROUP A STREP BY PCR: Group A Strep by PCR: NOT DETECTED

## 2021-06-20 LAB — TROPONIN I (HIGH SENSITIVITY)
Troponin I (High Sensitivity): 7 ng/L (ref <18)
Troponin I (High Sensitivity): 9 ng/L (ref <18)

## 2021-06-20 LAB — CBG MONITORING, ED: Glucose-Capillary: 160 mg/dL — ABNORMAL HIGH (ref 70–99)

## 2021-06-20 NOTE — [place] FL2 (Addendum)
[place] LEVEL OF CARE SCREENING TOOL     IDENTIFICATION  Patient Name: Sherri Burns Birthdate: 1936-10-29 Sex: female Admission Date (Current Location): 06/20/2021  [facility] and [place] Number:  Herbalist and Address:  [facility],  [address], [place]      Provider Number: [id number]  Attending Physician Name and Address:  No att. providers found  Relative Name and Phone Number:  Wolfgang Phoenix, daughter, [phone]    Current Level of Care:   Recommended Level of Care: Assisted Living Facility Prior Approval Number:    Date Approved/Denied:   PASRR Number:    Discharge Plan: Other (Comment) (Assisted Living)    Current Diagnoses: Patient Active Problem List   Diagnosis Date Noted   Lower abdominal pain 04/18/2021   Pressure injury of right heel, unstageable ([place]) 12/06/2020   Lower GI bleed 05/31/2020   Statin intolerance 11/12/2019   GAD (generalized anxiety disorder) 08/07/2019   Long term prescription benzodiazepine use 08/07/2019   GI bleed 04/10/2019   S/p nephrectomy 06/24/2018   Chronic diastolic CHF (congestive heart failure) ([place]) 02/21/2018   Acute lower GI bleeding 02/20/2018   Hyperlipidemia    Abnormal EKG 02/07/2016   Type 2 diabetes mellitus with diabetic polyneuropathy, without long-term current use of insulin ([place]) 09/23/2012   Essential hypertension    Gastroesophageal reflux disease without esophagitis    Hypothyroidism    CKD (chronic kidney disease), stage III ([facility])     Orientation RESPIRATION BLADDER Height & Weight     Place, Situation, Time, Self  Normal Continent Weight: 174 lb (78.9 kg) Height:  5' 6.5" (168.9 cm)  BEHAVIORAL SYMPTOMS/MOOD NEUROLOGICAL BOWEL NUTRITION STATUS      Continent    AMBULATORY STATUS COMMUNICATION OF NEEDS Skin   Independent Verbally Normal                       Personal Care Assistance Level of Assistance  Bathing, Feeding, Dressing  Bathing Assistance: Independent Feeding assistance: Independent Dressing Assistance: Independent     Functional Limitations Info  Sight, Hearing, Speech Sight Info: Impaired (wears glasses) Hearing Info: Adequate Speech Info: Adequate    SPECIAL CARE FACTORS FREQUENCY                       Contractures Contractures Info: Not present    Additional Factors Info  Code Status, Allergies Code Status Info: Full Allergies Info: (10)Ace Inhibitors,Codeine,Januvia (Sitagliptin),Metformin And Related,Ciprocin-fluocin-procin (Fluocinolone Acetonide),Penicillins,Clonidine Derivatives,Crestor (Rosuvastatin Calcium),Tessalon Perles,Zocor (Simvastatin)           Current Medications (06/20/2021):  This is the current hospital active medication list No current facility-administered medications for this encounter.   Current Outpatient Medications  Medication Sig Dispense Refill   acetaminophen (TYLENOL) 325 MG tablet Take 2 tablets (650 mg total) by mouth every 6 (six) hours as needed for mild pain (or Fever >/= 101). (Patient taking differently: Take 325 mg by mouth every 6 (six) hours as needed for mild pain.) 30 tablet 0   acetic acid 2 % otic solution Place 4 drops into both ears 3 (three) times daily. (Patient taking differently: Place 4 drops into both ears 3 (three) times daily as needed (wax bulidup).) 15 mL 0   allopurinol (ZYLOPRIM) 100 MG tablet Take 1 tablet (100 mg total) by mouth daily. (Patient taking differently: Take 50 mg by mouth daily.) 30 tablet 0   amLODipine (NORVASC) 5 MG tablet Take  1 tablet (5 mg total) by mouth daily. 90 tablet 3   brimonidine (ALPHAGAN) 0.2 % ophthalmic solution Place 1 drop into the left eye 2 (two) times daily as needed (Dry eyes).     cholecalciferol (VITAMIN D3) 25 MCG (1000 UNIT) tablet Take 1,000 Units by mouth daily.     clonazePAM (KLONOPIN) 1 MG tablet TAKE HALF TABLET BY MOUTH EVERY MORNING AND TAKE ONE TABLET BY MOUTH AT BEDTIME  (Patient taking differently: Take 0.5-1 mg by mouth See admin instructions. 0.5 mg in the morning and 1 mg at bedtime.) 45 tablet 4   diclofenac Sodium (VOLTAREN) 1 % GEL Apply 2 g topically 3 (three) times daily as needed (for joint pain). 50 g 0   dicyclomine (BENTYL) 10 MG capsule Take 1 capsule (10 mg total) by mouth 2 (two) times daily. 60 capsule 5   fluticasone (FLONASE) 50 MCG/ACT nasal spray Place 1-2 sprays into both nostrils daily. (Patient taking differently: Place 1-2 sprays into both nostrils daily as needed for allergies.) 16 g 3   furosemide (LASIX) 40 MG tablet Take 0.5 tablets (20 mg total) by mouth daily. 90 tablet 0   glipiZIDE (GLUCOTROL) 10 MG tablet Take 20 mg by mouth 2 (two) times daily before a meal.     levothyroxine (SYNTHROID) 75 MCG tablet Take 1 tablet (75 mcg total) by mouth daily before breakfast. 90 tablet 1   metoprolol tartrate (LOPRESSOR) 100 MG tablet Take 1 tablet (100 mg total) by mouth 2 (two) times daily. 180 tablet 1   ondansetron (ZOFRAN) 4 MG tablet Take 1 tablet (4 mg total) by mouth every 6 (six) hours as needed for nausea. 20 tablet 0   pantoprazole (PROTONIX) 40 MG tablet Take 1 tablet (40 mg total) by mouth daily. 30 tablet 5   polyethylene glycol (MIRALAX / GLYCOLAX) 17 g packet Take 17 g by mouth daily as needed for mild constipation. 14 each 0   vitamin E 100 UNIT capsule Take 100 Units by mouth daily.       Discharge Medications: Please see discharge summary for a list of discharge medications.  Relevant Imaging Results:  Relevant Lab Results:   Additional Information SSN# [id number] Pt has had Covid vaccinations  Wanna Gully, LCSW

## 2021-06-20 NOTE — Social Work (Signed)
[facility] CSW covering remotely was consulted to complete FL2 for ALF for Pt who is preparing to move to [facility].  CSW called Pt via phone to gather all of the information for FL2.  FL2 sent via hub to facility.  Vergie Living MSW LCSWA Transitions of Care  Clinical Social Worker  [facility] Emergency Departments  [phone]

## 2021-06-20 NOTE — ED Provider Notes (Signed)
[facility] DEPT Provider Note   CSN: [id number] Arrival date & time: 06/20/21  [id number]     History Chief Complaint  Patient presents with   Chest Pain   Urinary Frequency   Headache   Eye Pain   Sore Throat    AISLA ROBLYER is a 85 y.o. female.  Patient is a 85 year old female with a history of anxiety, CHF, diabetes, hypertension, prior renal cell carcinoma status post right nephrectomy who presents with multiple complaints.  She says that about 2 weeks ago she stopped her Klonopin and stop cigarette smoking.  She has had some significant anxiety since that time.  Her PCP has tried her on 2 different medicines that she is reluctant to take.  The first when she only started taking half a pill and she said she was prescribed a second 1 but she was too nervous to take it.  She complains of some pain in her throat and feels like she has sores in her throat.  She says the pain goes down her throat and into her chest and upper abdomen.  Seems to be worse after eating.  There is no other abdominal pain.  She says that her stool looks like pumpkin and is hard balls.  She complains of some dark-colored urine but no burning on urination.  She denies any known fevers.  No nausea or vomiting.  No chest pain or shortness of breath other than the pain that goes down from her throat into her chest.      Past Medical History:  Diagnosis Date   Abnormal EKG 02/07/2016   Inferolateral T wave inversion and ST depression.   Acute calculous cholecystitis    Anxiety    Arthritis    Chronic diastolic CHF (congestive heart failure) ([place]) 02/21/2018   Colon polyps 11/2008   tubular adenoma   Diabetes mellitus    Esophageal problem    esophageal dilation   GERD (gastroesophageal reflux disease)    + hpylori   GI bleeding 01/2018   Headache    Hyperlipidemia    Hypertension    Hypothyroidism    Ischemic colitis [facility])    Renal cancer, right ([place]) 2010   s/p Rt  nephrectomy by Dr. Rosana Hoes   Renal insufficiency     Patient Active Problem List   Diagnosis Date Noted   Lower abdominal pain 04/18/2021   Pressure injury of right heel, unstageable ([place]) 12/06/2020   Lower GI bleed 05/31/2020   Statin intolerance 11/12/2019   GAD (generalized anxiety disorder) 08/07/2019   Long term prescription benzodiazepine use 08/07/2019   GI bleed 04/10/2019   S/p nephrectomy 06/24/2018   Chronic diastolic CHF (congestive heart failure) ([place]) 02/21/2018   Acute lower GI bleeding 02/20/2018   Hyperlipidemia    Abnormal EKG 02/07/2016   Type 2 diabetes mellitus with diabetic polyneuropathy, without long-term current use of insulin ([place]) 09/23/2012   Essential hypertension    Gastroesophageal reflux disease without esophagitis    Hypothyroidism    CKD (chronic kidney disease), stage III ([place])     Past Surgical History:  Procedure Laterality Date   ABDOMINAL HYSTERECTOMY  1978   partial   CATARACT EXTRACTION     CHOLECYSTECTOMY N/A 04/04/2016   Procedure: LAPAROSCOPIC CHOLECYSTECTOMY;  Surgeon: Autumn Messing III, MD;  Location: [facility];  Service: General;  Laterality: N/A;   COLONOSCOPY     ESOPHAGEAL DILATION  2016   HERNIA REPAIR     LAPAROSCOPIC CHOLECYSTECTOMY  04/04/2016  NEPHRECTOMY Right 2010   Dr. Rosana Hoes, urology, due to renal cancer     OB History   No obstetric history on file.     Family History  Problem Relation Age of Onset   Cancer Mother        colon, kidney cancer   Cancer Father        throat cancer   Suicidality Son    Cancer Sister    Heart attack Brother     Social History   Tobacco Use   Smoking status: Some Days    Packs/day: 0.25    Years: 55.00    Pack years: 13.75    Types: Cigarettes   Smokeless tobacco: Never   Tobacco comments:    cut back amount still  trying to quit  Vaping Use   Vaping Use: Never used  Substance Use Topics   Alcohol use: No    Alcohol/week: 0.0 standard drinks   Drug use: No    Home  Medications Prior to Admission medications   Medication Sig Start Date End Date Taking? Authorizing Provider  acetaminophen (TYLENOL) 325 MG tablet Take 2 tablets (650 mg total) by mouth every 6 (six) hours as needed for mild pain (or Fever >/= 101). Patient taking differently: Take 325 mg by mouth every 6 (six) hours as needed for mild pain. 06/02/20   Allie Bossier, MD  acetic acid 2 % otic solution Place 4 drops into both ears 3 (three) times daily. Patient taking differently: Place 4 drops into both ears 3 (three) times daily as needed (wax bulidup). 09/17/19   Jacelyn Pi, Lilia Argue, MD  allopurinol (ZYLOPRIM) 100 MG tablet Take 1 tablet (100 mg total) by mouth daily. Patient taking differently: Take 50 mg by mouth daily. 11/11/20   Trula Slade, DPM  amLODipine (NORVASC) 5 MG tablet Take 1 tablet (5 mg total) by mouth daily. 12/06/20   Just, Laurita Quint, FNP  brimonidine (ALPHAGAN) 0.2 % ophthalmic solution Place 1 drop into the left eye 2 (two) times daily as needed (Dry eyes). 10/27/19   [provider]  cholecalciferol (VITAMIN D3) 25 MCG (1000 UNIT) tablet Take 1,000 Units by mouth daily.    [provider]  clonazePAM (KLONOPIN) 1 MG tablet TAKE HALF TABLET BY MOUTH EVERY MORNING AND TAKE ONE TABLET BY MOUTH AT BEDTIME Patient taking differently: Take 0.5-1 mg by mouth See admin instructions. 0.5 mg in the morning and 1 mg at bedtime. 12/14/20   Just, Laurita Quint, FNP  diclofenac Sodium (VOLTAREN) 1 % GEL Apply 2 g topically 3 (three) times daily as needed (for joint pain). 09/13/20   Nuala Alpha A, PA-C  dicyclomine (BENTYL) 10 MG capsule Take 1 capsule (10 mg total) by mouth 2 (two) times daily. 04/18/21   Zehr, Laban Emperor, PA-C  fluticasone (FLONASE) 50 MCG/ACT nasal spray Place 1-2 sprays into both nostrils daily. Patient taking differently: Place 1-2 sprays into both nostrils daily as needed for allergies. 06/17/20   Daleen Squibb, MD  furosemide (LASIX) 40  MG tablet Take 0.5 tablets (20 mg total) by mouth daily. 08/08/20   Jacelyn Pi, Lilia Argue, MD  glipiZIDE (GLUCOTROL) 10 MG tablet Take 20 mg by mouth 2 (two) times daily before a meal.    [provider]  levothyroxine (SYNTHROID) 75 MCG tablet Take 1 tablet (75 mcg total) by mouth daily before breakfast. 08/18/20   Jacelyn Pi, Lilia Argue, MD  metoprolol tartrate (LOPRESSOR) 100 MG tablet Take  1 tablet (100 mg total) by mouth 2 (two) times daily. 06/16/20   Daleen Squibb, MD  ondansetron (ZOFRAN) 4 MG tablet Take 1 tablet (4 mg total) by mouth every 6 (six) hours as needed for nausea. 06/02/20   Allie Bossier, MD  pantoprazole (PROTONIX) 40 MG tablet Take 1 tablet (40 mg total) by mouth daily. 04/18/21   Zehr, Laban Emperor, PA-C  polyethylene glycol (MIRALAX / GLYCOLAX) 17 g packet Take 17 g by mouth daily as needed for mild constipation. 06/02/20   Allie Bossier, MD  vitamin E 100 UNIT capsule Take 100 Units by mouth daily.    [provider]    Allergies    Ace inhibitors, Codeine, Januvia [sitagliptin], Metformin and related, Ciprocin-fluocin-procin [fluocinolone acetonide], Clonidine derivatives, Crestor [rosuvastatin calcium], Penicillins, Tessalon perles, and Zocor [simvastatin]  Review of Systems   Review of Systems  Constitutional:  Positive for fatigue. Negative for chills, diaphoresis and fever.  HENT:  Positive for sore throat. Negative for congestion, rhinorrhea and sneezing.   Eyes: Negative.   Respiratory:  Negative for cough, chest tightness and shortness of breath.   Cardiovascular:  Positive for chest pain. Negative for leg swelling.  Gastrointestinal:  Positive for constipation. Negative for abdominal pain, blood in stool, diarrhea, nausea and vomiting.  Genitourinary:  Negative for difficulty urinating, flank pain, frequency and hematuria.  Musculoskeletal:  Negative for arthralgias and back pain.  Skin:  Negative for rash.  Neurological:  Negative for  dizziness, speech difficulty, weakness, numbness and headaches.   Physical Exam Updated Vital Signs BP (!) 182/91   Pulse 60   Temp 98.6 F (37 C) (Oral)   Resp 15   Ht 5' 6.5" (1.689 m)   Wt 78.9 kg   SpO2 100%   BMI 27.66 kg/m   Physical Exam Constitutional:      Appearance: She is well-developed.  HENT:     Head: Normocephalic and atraumatic.     Right Ear: Tympanic membrane normal.     Left Ear: Tympanic membrane normal.     Mouth/Throat:     Comments: Normal-appearing oropharynx, no sores or lesions are appreciated.  No erythema or exudates.  Uvula is midline.  No trismus Eyes:     Pupils: Pupils are equal, round, and reactive to light.  Cardiovascular:     Rate and Rhythm: Normal rate and regular rhythm.     Heart sounds: Normal heart sounds.  Pulmonary:     Effort: Pulmonary effort is normal. No respiratory distress.     Breath sounds: Normal breath sounds. No wheezing or rales.  Chest:     Chest wall: No tenderness.  Abdominal:     General: Bowel sounds are normal.     Palpations: Abdomen is soft.     Tenderness: There is no abdominal tenderness. There is no guarding or rebound.  Musculoskeletal:        General: No swelling or tenderness. Normal range of motion.     Cervical back: Normal range of motion and neck supple.  Lymphadenopathy:     Cervical: No cervical adenopathy.  Skin:    General: Skin is warm and dry.     Findings: No rash.  Neurological:     Mental Status: She is alert and oriented to person, place, and time.    ED Results / Procedures / Treatments   Labs (all labs ordered are listed, but only abnormal results are displayed) Labs Reviewed  COMPREHENSIVE METABOLIC PANEL - Abnormal;  Notable for the following components:      Result Value   Glucose, Bld 154 (*)    Creatinine, Ser 1.11 (*)    GFR, Estimated 50 (*)    All other components within normal limits  CBG MONITORING, ED - Abnormal; Notable for the following components:    Glucose-Capillary 160 (*)    All other components within normal limits  GROUP A STREP BY PCR  CBC  URINALYSIS, ROUTINE W REFLEX MICROSCOPIC  TYPE AND SCREEN  TROPONIN I (HIGH SENSITIVITY)  TROPONIN I (HIGH SENSITIVITY)    EKG EKG Interpretation  Date/Time:  Tuesday June 20 2021 08:19:44 EDT Ventricular Rate:  68 PR Interval:  190 QRS Duration: 111 QT Interval:  395 QTC Calculation: 421 R Axis:   -31 Text Interpretation: Sinus rhythm Multiform ventricular premature complexes Left ventricular hypertrophy Nonspecific T abnormalities, diffuse leads 12 Lead; [place] since last tracing no significant change Confirmed by Malvin Johns [phone]) on 06/20/2021 12:40:29 PM  Radiology DG Chest 2 View  Result Date: 06/20/2021 CLINICAL DATA:  Chest pain. EXAM: CHEST - 2 VIEW COMPARISON:  03/28/2021. FINDINGS: Mediastinum hilar structures normal. Heart size normal. No focal infiltrate. No pleural effusion or pneumothorax. Degenerative change thoracic spine. No acute bony abnormality. Surgical clips upper abdomen. IMPRESSION: 1.  No acute cardiopulmonary disease. 2.  No acute bony abnormality.  No pneumothorax. Electronically Signed   By: Marcello Moores  Register   On: 06/20/2021 08:46   DG Abdomen 1 View  Result Date: 06/20/2021 CLINICAL DATA:  85 year old female with small bowel obstruction EXAM: ABDOMEN - 1 VIEW COMPARISON:  Abdominal CT 03/28/2021, 05/31/2020, plain film 04/11/2019 FINDINGS: Gas within stomach small bowel and colon.  No abnormal distention. No air-fluid levels. Formed stool within the left colon and rectum. Surgical changes in the upper abdomen and mid right abdomen. Vascular calcifications. No unexpected radiopaque foreign body. No unexpected soft tissue density or calcification. Pelvic phleboliths. No displaced fracture IMPRESSION: No evidence of bowel obstruction. Electronically Signed   By: Corrie Mckusick D.O.   On: 06/20/2021 11:01    Procedures Procedures   Medications Ordered  in ED Medications - No data to display  ED Course  I have reviewed the triage vital signs and the nursing notes.  Pertinent labs & imaging results that were available during my care of the patient were reviewed by me and considered in my medical decision making (see chart for details).    MDM Rules/Calculators/A&P                           Patient is a 85 year old female who presents with multiple complaints.  She seems to have a lot of anxiety after stopping her Klonopin and stopping cigarette smoking.  She does focus on her throat hurting.  Her throat exam looks normal.  I do not see any redness or sores.  I do not see any suggestions of thrush.  Rapid strep was negative.  Her labs are nonconcerning.  She had some burning going from her throat down to her epigastrium which could represent GERD.  Her chest x-ray did not show any acute abnormality.  X-ray of her abdomen showed no acute abnormality.  She has no evidence of a UTI.  She was discharged home in good condition.  She is already on a PPI but she will continue.  She was advised to follow-up with her PCP.  Return precautions were given. Final Clinical Impression(s) / ED Diagnoses Final diagnoses:  Sore throat    Rx / DC Orders ED Discharge Orders     None        Malvin Johns, MD 06/20/21 1714

## 2021-06-20 NOTE — ED Notes (Signed)
Pt called for vitals update no answer

## 2021-06-20 NOTE — Discharge Instructions (Addendum)
Follow-up with your primary care doctor.  Return here as needed for any worsening symptoms. 

## 2021-06-20 NOTE — ED Triage Notes (Signed)
Patient c/o mid chest pain that started 30 minutes ago. Patient c/o sore throat x 1 week ago and worse now. Patient c/o urinary frequency, headache, and left eye pain.

## 2021-06-20 NOTE — ED Notes (Signed)
Rep for [facility] was at bedside.  Sts the Pt needs a FL2 to be placed.  TOC consult entered.

## 2021-06-20 NOTE — ED Provider Notes (Signed)
Emergency Medicine Provider Triage Evaluation Note  Sherri Burns , a 85 y.o. female  was evaluated in triage.  Pt complains of pain all over, worse in her abdomen moving up her throat.  She has been having pumpkin colored stool and can colored urine for multiple days now. Her last bowel movement yesterday, she passed gas last night.  Review of Systems  Positive: Dysuria, orange urine, orange stool, abdominal pain, body aches Negative: Vomiting, nausea  Physical Exam  BP (!) 171/102 (BP Location: Left Arm)   Pulse 61   Temp 98.6 F (37 C) (Oral)   Resp 16   Ht 5' 6.5" (1.689 m)   Wt 78.9 kg   SpO2 98%   BMI 27.66 kg/m  Gen:   Awake, no distress   Resp:  Normal effort  MSK:   Moves extremities without difficulty  Other:  Abdomen is diffusely tender, firm abdomen   Medical Decision Making  Medically screening exam initiated at 10:21 AM.  Appropriate orders placed.  Sherri Burns was informed that the remainder of the evaluation will be completed by another provider, this initial triage assessment does not replace that evaluation, and the importance of remaining in the ED until their evaluation is complete.     Sherri Raring, PA-C 06/20/21 1024    Sherri Belling, MD 06/20/21 1606

## 2021-06-30 ENCOUNTER — Emergency Department (HOSPITAL_COMMUNITY)
Admission: EM | Admit: 2021-06-30 | Discharge: 2021-07-01 | Disposition: A | Discharge status: Home / Self Care | Payer: Medicare Other | Attending: Emergency Medicine | Admitting: Emergency Medicine

## 2021-06-30 ENCOUNTER — Encounter (HOSPITAL_COMMUNITY): Payer: Self-pay

## 2021-06-30 ENCOUNTER — Other Ambulatory Visit: Payer: Self-pay

## 2021-06-30 DIAGNOSIS — N183 Chronic kidney disease, stage 3 unspecified: Secondary | ICD-10-CM | POA: Diagnosis not present

## 2021-06-30 DIAGNOSIS — R1032 Left lower quadrant pain: Secondary | ICD-10-CM | POA: Diagnosis present

## 2021-06-30 DIAGNOSIS — Z79899 Other long term (current) drug therapy: Secondary | ICD-10-CM | POA: Diagnosis not present

## 2021-06-30 DIAGNOSIS — I130 Hypertensive heart and chronic kidney disease with heart failure and stage 1 through stage 4 chronic kidney disease, or unspecified chronic kidney disease: Secondary | ICD-10-CM | POA: Diagnosis not present

## 2021-06-30 DIAGNOSIS — F17210 Nicotine dependence, cigarettes, uncomplicated: Secondary | ICD-10-CM | POA: Diagnosis not present

## 2021-06-30 DIAGNOSIS — K625 Hemorrhage of anus and rectum: Secondary | ICD-10-CM | POA: Diagnosis not present

## 2021-06-30 DIAGNOSIS — Z7984 Long term (current) use of oral hypoglycemic drugs: Secondary | ICD-10-CM | POA: Insufficient documentation

## 2021-06-30 DIAGNOSIS — E1142 Type 2 diabetes mellitus with diabetic polyneuropathy: Secondary | ICD-10-CM | POA: Insufficient documentation

## 2021-06-30 DIAGNOSIS — E039 Hypothyroidism, unspecified: Secondary | ICD-10-CM | POA: Insufficient documentation

## 2021-06-30 DIAGNOSIS — Z85528 Personal history of other malignant neoplasm of kidney: Secondary | ICD-10-CM | POA: Diagnosis not present

## 2021-06-30 DIAGNOSIS — I5032 Chronic diastolic (congestive) heart failure: Secondary | ICD-10-CM | POA: Insufficient documentation

## 2021-06-30 DIAGNOSIS — K219 Gastro-esophageal reflux disease without esophagitis: Secondary | ICD-10-CM | POA: Insufficient documentation

## 2021-06-30 DIAGNOSIS — K921 Melena: Secondary | ICD-10-CM

## 2021-06-30 NOTE — ED Triage Notes (Signed)
Rectal bleeding x 2 episodes. Hx of diverticulitis. Also reports abdominal pain. Denies any blood thinners.

## 2021-07-01 ENCOUNTER — Emergency Department (HOSPITAL_COMMUNITY): Payer: Medicare Other

## 2021-07-01 LAB — COMPREHENSIVE METABOLIC PANEL
ALT: 16 U/L (ref 0–44)
AST: 27 U/L (ref 15–41)
Albumin: 3.9 g/dL (ref 3.5–5.0)
Alkaline Phosphatase: 67 U/L (ref 38–126)
Anion gap: 8 (ref 5–15)
BUN: 14 mg/dL (ref 8–23)
CO2: 29 mmol/L (ref 22–32)
Calcium: 9.4 mg/dL (ref 8.9–10.3)
Chloride: 102 mmol/L (ref 98–111)
Creatinine, Ser: 1.33 mg/dL — ABNORMAL HIGH (ref 0.44–1.00)
GFR, Estimated: 40 mL/min — ABNORMAL LOW (ref >60)
Glucose, Bld: 210 mg/dL — ABNORMAL HIGH (ref 70–99)
Potassium: 4.6 mmol/L (ref 3.5–5.1)
Sodium: 139 mmol/L (ref 135–145)
Total Bilirubin: 1.1 mg/dL (ref 0.3–1.2)
Total Protein: 6.6 g/dL (ref 6.5–8.1)

## 2021-07-01 LAB — CBC
HCT: 40.2 % (ref 36.0–46.0)
Hemoglobin: 13.3 g/dL (ref 12.0–15.0)
MCH: 32.8 pg (ref 26.0–34.0)
MCHC: 33.1 g/dL (ref 30.0–36.0)
MCV: 99 fL (ref 80.0–100.0)
Platelets: 221 10*3/uL (ref 150–400)
RBC: 4.06 MIL/uL (ref 3.87–5.11)
RDW: 14.6 % (ref 11.5–15.5)
WBC: 7.1 10*3/uL (ref 4.0–10.5)
nRBC: 0 % (ref 0.0–0.2)

## 2021-07-01 MED ORDER — ACETAMINOPHEN 325 MG PO TABS
650.0000 mg | ORAL_TABLET | Freq: Once | ORAL | Status: AC
  Administered 2021-07-01: 650 mg via ORAL
  Filled 2021-07-01: qty 2

## 2021-07-01 NOTE — ED Provider Notes (Signed)
[facility] EMERGENCY DEPARTMENT Provider Note  CSN: [id number] Arrival date & time: 06/30/21 2131  Chief Complaint(s) Rectal Bleeding  HPI Sherri Burns is a 85 y.o. female with a past medical history listed below including diverticulosis here for hematochezia.  Patient reports first episode this morning.  Resolved for several hours and then recurred this evening.  Has not had any additional bowel movement since.  Endorses associated left lower quadrant abdominal discomfort.  She is not anticoagulated.  No associated fevers, nausea, vomiting.  No urinary symptoms.    The history is provided by the patient.   Past Medical History Past Medical History:  Diagnosis Date   Abnormal EKG 02/07/2016   Inferolateral T wave inversion and ST depression.   Acute calculous cholecystitis    Anxiety    Arthritis    Chronic diastolic CHF (congestive heart failure) ([place]) 02/21/2018   Colon polyps 11/2008   tubular adenoma   Diabetes mellitus    Esophageal problem    esophageal dilation   GERD (gastroesophageal reflux disease)    + hpylori   GI bleeding 01/2018   Headache    Hyperlipidemia    Hypertension    Hypothyroidism    Ischemic colitis [facility])    Renal cancer, right ([place]) 2010   s/p Rt nephrectomy by Dr. Rosana Hoes   Renal insufficiency    Patient Active Problem List   Diagnosis Date Noted   Lower abdominal pain 04/18/2021   Pressure injury of right heel, unstageable ([place]) 12/06/2020   Lower GI bleed 05/31/2020   Statin intolerance 11/12/2019   GAD (generalized anxiety disorder) 08/07/2019   Long term prescription benzodiazepine use 08/07/2019   GI bleed 04/10/2019   S/p nephrectomy 06/24/2018   Chronic diastolic CHF (congestive heart failure) ([place]) 02/21/2018   Acute lower GI bleeding 02/20/2018   Hyperlipidemia    Abnormal EKG 02/07/2016   Type 2 diabetes mellitus with diabetic polyneuropathy, without long-term current use of insulin ([place]) 09/23/2012    Essential hypertension    Gastroesophageal reflux disease without esophagitis    Hypothyroidism    CKD (chronic kidney disease), stage III ([place])    Home Medication(s) Prior to Admission medications   Medication Sig Start Date End Date Taking? Authorizing Provider  acetaminophen (TYLENOL) 325 MG tablet Take 2 tablets (650 mg total) by mouth every 6 (six) hours as needed for mild pain (or Fever >/= 101). Patient taking differently: Take 325 mg by mouth every 6 (six) hours as needed for mild pain. 06/02/20  Yes Allie Bossier, MD  acetic acid 2 % otic solution Place 4 drops into both ears 3 (three) times daily. Patient taking differently: Place 4 drops into both ears 3 (three) times daily as needed (wax bulidup). 09/17/19  Yes Jacelyn Pi, Lilia Argue, MD  allopurinol (ZYLOPRIM) 100 MG tablet Take 1 tablet (100 mg total) by mouth daily. Patient taking differently: Take 50 mg by mouth daily. 11/11/20  Yes Trula Slade, DPM  amLODipine (NORVASC) 5 MG tablet Take 1 tablet (5 mg total) by mouth daily. 12/06/20  Yes Just, Laurita Quint, FNP  brimonidine (ALPHAGAN) 0.2 % ophthalmic solution Place 1 drop into the left eye 2 (two) times daily as needed (Dry eyes). 10/27/19  Yes [provider]  cholecalciferol (VITAMIN D3) 25 MCG (1000 UNIT) tablet Take 1,000 Units by mouth daily.   Yes [provider]  diclofenac Sodium (VOLTAREN) 1 % GEL Apply 2 g topically 3 (three) times daily as needed (for joint  pain). 09/13/20  Yes Nuala Alpha A, PA-C  dicyclomine (BENTYL) 10 MG capsule Take 1 capsule (10 mg total) by mouth 2 (two) times daily. 04/18/21  Yes Zehr, Laban Emperor, PA-C  fluticasone (FLONASE) 50 MCG/ACT nasal spray Place 1-2 sprays into both nostrils daily. Patient taking differently: Place 1-2 sprays into both nostrils daily as needed for allergies. 06/17/20  Yes Jacelyn Pi, Lilia Argue, MD  furosemide (LASIX) 40 MG tablet Take 0.5 tablets (20 mg total) by mouth daily. Patient taking  differently: Take 40 mg by mouth daily. 08/08/20  Yes Jacelyn Pi, Lilia Argue, MD  glipiZIDE (GLUCOTROL) 10 MG tablet Take 20 mg by mouth 2 (two) times daily before a meal.   Yes [provider]  levothyroxine (SYNTHROID) 75 MCG tablet Take 1 tablet (75 mcg total) by mouth daily before breakfast. 08/18/20  Yes Jacelyn Pi, Lilia Argue, MD  metoprolol tartrate (LOPRESSOR) 100 MG tablet Take 1 tablet (100 mg total) by mouth 2 (two) times daily. 06/16/20  Yes Jacelyn Pi, Lilia Argue, MD  ondansetron (ZOFRAN) 4 MG tablet Take 1 tablet (4 mg total) by mouth every 6 (six) hours as needed for nausea. 06/02/20  Yes Allie Bossier, MD  pantoprazole (PROTONIX) 40 MG tablet Take 1 tablet (40 mg total) by mouth daily. 04/18/21  Yes Zehr, Laban Emperor, PA-C  polyethylene glycol (MIRALAX / GLYCOLAX) 17 g packet Take 17 g by mouth daily as needed for mild constipation. 06/02/20  Yes Allie Bossier, MD  vitamin E 100 UNIT capsule Take 100 Units by mouth daily.   Yes [provider]  clonazePAM (KLONOPIN) 1 MG tablet TAKE HALF TABLET BY MOUTH EVERY MORNING AND TAKE ONE TABLET BY MOUTH AT BEDTIME Patient not taking: Reported on 07/01/2021 12/14/20   Just, Laurita Quint, FNP                                                                                                                                    Past Surgical History Past Surgical History:  Procedure Laterality Date   ABDOMINAL HYSTERECTOMY  1978   partial   CATARACT EXTRACTION     CHOLECYSTECTOMY N/A 04/04/2016   Procedure: LAPAROSCOPIC CHOLECYSTECTOMY;  Surgeon: Autumn Messing III, MD;  Location: [facility];  Service: General;  Laterality: N/A;   COLONOSCOPY     ESOPHAGEAL DILATION  2016   HERNIA REPAIR     LAPAROSCOPIC CHOLECYSTECTOMY  04/04/2016   NEPHRECTOMY Right 2010   Dr. Rosana Hoes, urology, due to renal cancer   Family History Family History  Problem Relation Age of Onset   Cancer Mother        colon, kidney cancer   Cancer Father        throat cancer    Suicidality Son    Cancer Sister    Heart attack Brother     Social History Social History   Tobacco Use   Smoking status: Some Days  Packs/day: 0.25    Years: 55.00    Pack years: 13.75    Types: Cigarettes   Smokeless tobacco: Never   Tobacco comments:    cut back amount still  trying to quit  Vaping Use   Vaping Use: Never used  Substance Use Topics   Alcohol use: No    Alcohol/week: 0.0 standard drinks   Drug use: No   Allergies Ace inhibitors, Codeine, Januvia [sitagliptin], Metformin and related, Ciprocin-fluocin-procin [fluocinolone acetonide], Clonidine derivatives, Crestor [rosuvastatin calcium], Penicillins, Tessalon perles, and Zocor [simvastatin]  Review of Systems Review of Systems All other systems are reviewed and are negative for acute change except as noted in the HPI  Physical Exam Vital Signs  I have reviewed the triage vital signs BP (!) 169/86   Pulse 73   Temp 98.4 F (36.9 C) (Oral)   Resp 19   Ht '5\' 6"'$  (1.676 m)   Wt 78.9 kg   SpO2 99%   BMI 28.08 kg/m   Physical Exam Vitals reviewed.  Constitutional:      General: She is not in acute distress.    Appearance: She is well-developed. She is not diaphoretic.  HENT:     Head: Normocephalic and atraumatic.     Right Ear: External ear normal.     Left Ear: External ear normal.     Nose: Nose normal.  Eyes:     General: No scleral icterus.    Conjunctiva/sclera: Conjunctivae normal.  Neck:     Trachea: Phonation normal.  Cardiovascular:     Rate and Rhythm: Normal rate and regular rhythm.  Pulmonary:     Effort: Pulmonary effort is normal. No respiratory distress.     Breath sounds: No stridor.  Abdominal:     General: There is no distension.     Tenderness: There is abdominal tenderness in the left lower quadrant. There is no guarding or rebound.  Musculoskeletal:        General: Normal range of motion.     Cervical back: Normal range of motion.  Neurological:     Mental  Status: She is alert and oriented to person, place, and time.  Psychiatric:        Behavior: Behavior normal.    ED Results and Treatments Labs (all labs ordered are listed, but only abnormal results are displayed) Labs Reviewed  COMPREHENSIVE METABOLIC PANEL - Abnormal; Notable for the following components:      Result Value   Glucose, Bld 210 (*)    Creatinine, Ser 1.33 (*)    GFR, Estimated 40 (*)    All other components within normal limits  CBC  POC OCCULT BLOOD, ED  TYPE AND SCREEN                                                                                                                         EKG  EKG Interpretation  Date/Time:    Ventricular Rate:    PR Interval:  QRS Duration:   QT Interval:    QTC Calculation:   R Axis:     Text Interpretation:         Radiology CT ABDOMEN PELVIS WO CONTRAST  Result Date: 07/01/2021 CLINICAL DATA:  Rectal bleeding x2.  Diverticulitis suspected EXAM: CT ABDOMEN AND PELVIS WITHOUT CONTRAST TECHNIQUE: Multidetector CT imaging of the abdomen and pelvis was performed following the standard protocol without IV contrast. COMPARISON:  03/28/2021 FINDINGS: Lower chest:  No contributory findings. Hepatobiliary: No focal liver abnormality.Cholecystectomy. No bile duct dilatation. Pancreas: Unremarkable. Spleen: Unremarkable. Adrenals/Urinary Tract: Negative adrenals. Right nephrectomy no hydronephrosis or stone. Unremarkable bladder. Stomach/Bowel: No obstruction. No appendicitis. Uncomplicated duodenal diverticulum. Sigmoid diverticulosis. Vascular/Lymphatic: No acute vascular abnormality. No mass or adenopathy. Reproductive:No pathologic findings. Other: No ascites or pneumoperitoneum.  Fatty left inguinal hernia. Musculoskeletal: No acute abnormalities. Prominent spinal degeneration with L4-5 and L5-S1 anterolisthesis. IMPRESSION: 1. No acute finding or change from May 2022. 2. Sigmoid diverticulosis. 3. Fatty left inguinal hernia.  Electronically Signed   By: Monte Fantasia M.D.   On: 07/01/2021 07:32    Pertinent labs & imaging results that were available during my care of the patient were reviewed by me and considered in my medical decision making (see MDM for details).  Medications Ordered in ED Medications  acetaminophen (TYLENOL) tablet 650 mg (650 mg Oral Given 07/01/21 0306)                                                                                                                                     Procedures Procedures  (including critical care time)  Medical Decision Making / ED Course I have reviewed the nursing notes for this encounter and the patient's prior records (if available in EHR or on provided paperwork).  Sherri Burns was evaluated in Emergency Department on 07/01/2021 for the symptoms described in the history of present illness. She was evaluated in the context of the global COVID-19 pandemic, which necessitated consideration that the patient might be at risk for infection with the SARS-CoV-2 virus that causes COVID-19. Institutional protocols and algorithms that pertain to the evaluation of patients at risk for COVID-19 are in a state of rapid change based on information released by regulatory bodies including the CDC and federal and state organizations. These policies and algorithms were followed during the patient's care in the ED.     Left lower quadrant with hematochezia. History of diverticulosis. No active bleeding for several hours. Patient is not anticoagulated.  Will obtain screening labs and CT to assess for possible diverticulitis, or colitis.  Pertinent labs & imaging results that were available during my care of the patient were reviewed by me and considered in my medical decision making:  CBC without leukocytosis or anemia. No significant electrolyte derangement.  Slightly worse renal function than baseline. Hyperglycemia without evidence of DKA.  CT scan  negative.   Final Clinical Impression(s) / ED  Diagnoses Final diagnoses:  Hematochezia   The patient appears reasonably screened and/or stabilized for discharge and I doubt any other medical condition or other [facility] requiring further screening, evaluation, or treatment in the ED at this time prior to discharge. Safe for discharge with strict return precautions.  Disposition: Discharge  Condition: Good  I have discussed the results, Dx and Tx plan with the patient/family who expressed understanding and agree(s) with the plan. Discharge instructions discussed at length. The patient/family was given strict return precautions who verbalized understanding of the instructions. No further questions at time of discharge.    ED Discharge Orders     None         Follow Up: [address], [place] [address] [phone]  Call  to schedule an appointment for close follow up     This chart was dictated using voice recognition software.  Despite best efforts to proofread,  errors can occur which can change the documentation meaning.    Fatima Blank, MD 07/01/21 [phone]

## 2021-07-03 LAB — TYPE AND SCREEN
ABO/RH(D): O POS
Antibody Screen: NEGATIVE

## 2021-07-06 DIAGNOSIS — I130 Hypertensive heart and chronic kidney disease with heart failure and stage 1 through stage 4 chronic kidney disease, or unspecified chronic kidney disease: Secondary | ICD-10-CM | POA: Insufficient documentation

## 2021-07-06 DIAGNOSIS — Z7984 Long term (current) use of oral hypoglycemic drugs: Secondary | ICD-10-CM | POA: Insufficient documentation

## 2021-07-06 DIAGNOSIS — N183 Chronic kidney disease, stage 3 unspecified: Secondary | ICD-10-CM | POA: Insufficient documentation

## 2021-07-06 DIAGNOSIS — E1142 Type 2 diabetes mellitus with diabetic polyneuropathy: Secondary | ICD-10-CM | POA: Insufficient documentation

## 2021-07-06 DIAGNOSIS — I5032 Chronic diastolic (congestive) heart failure: Secondary | ICD-10-CM | POA: Diagnosis not present

## 2021-07-06 DIAGNOSIS — Z79899 Other long term (current) drug therapy: Secondary | ICD-10-CM | POA: Diagnosis not present

## 2021-07-06 DIAGNOSIS — U071 COVID-19: Secondary | ICD-10-CM | POA: Insufficient documentation

## 2021-07-06 DIAGNOSIS — R519 Headache, unspecified: Secondary | ICD-10-CM | POA: Diagnosis present

## 2021-07-06 DIAGNOSIS — Z85528 Personal history of other malignant neoplasm of kidney: Secondary | ICD-10-CM | POA: Insufficient documentation

## 2021-07-06 DIAGNOSIS — E1122 Type 2 diabetes mellitus with diabetic chronic kidney disease: Secondary | ICD-10-CM | POA: Insufficient documentation

## 2021-07-06 DIAGNOSIS — E039 Hypothyroidism, unspecified: Secondary | ICD-10-CM | POA: Diagnosis not present

## 2021-07-06 DIAGNOSIS — F17210 Nicotine dependence, cigarettes, uncomplicated: Secondary | ICD-10-CM | POA: Diagnosis not present

## 2021-07-07 ENCOUNTER — Emergency Department (HOSPITAL_COMMUNITY)
Admission: EM | Admit: 2021-07-07 | Discharge: 2021-07-07 | Disposition: A | Discharge status: Home / Self Care | Payer: Medicare Other | Attending: Emergency Medicine | Admitting: Emergency Medicine

## 2021-07-07 ENCOUNTER — Emergency Department (HOSPITAL_COMMUNITY): Payer: Medicare Other

## 2021-07-07 ENCOUNTER — Other Ambulatory Visit: Payer: Self-pay

## 2021-07-07 ENCOUNTER — Encounter (HOSPITAL_COMMUNITY): Payer: Self-pay | Admitting: Emergency Medicine

## 2021-07-07 DIAGNOSIS — U071 COVID-19: Secondary | ICD-10-CM

## 2021-07-07 DIAGNOSIS — R519 Headache, unspecified: Secondary | ICD-10-CM

## 2021-07-07 LAB — BASIC METABOLIC PANEL
Anion gap: 8 (ref 5–15)
BUN: 21 mg/dL (ref 8–23)
CO2: 25 mmol/L (ref 22–32)
Calcium: 9.1 mg/dL (ref 8.9–10.3)
Chloride: 104 mmol/L (ref 98–111)
Creatinine, Ser: 1.44 mg/dL — ABNORMAL HIGH (ref 0.44–1.00)
GFR, Estimated: 36 mL/min — ABNORMAL LOW (ref >60)
Glucose, Bld: 112 mg/dL — ABNORMAL HIGH (ref 70–99)
Potassium: 3.9 mmol/L (ref 3.5–5.1)
Sodium: 137 mmol/L (ref 135–145)

## 2021-07-07 LAB — TROPONIN I (HIGH SENSITIVITY)
Troponin I (High Sensitivity): 10 ng/L (ref <18)
Troponin I (High Sensitivity): 9 ng/L (ref <18)

## 2021-07-07 LAB — CBC WITH DIFFERENTIAL/PLATELET
Abs Immature Granulocytes: 0.05 10*3/uL (ref 0.00–0.07)
Basophils Absolute: 0 10*3/uL (ref 0.0–0.1)
Basophils Relative: 1 %
Eosinophils Absolute: 0.1 10*3/uL (ref 0.0–0.5)
Eosinophils Relative: 2 %
HCT: 38.4 % (ref 36.0–46.0)
Hemoglobin: 13.2 g/dL (ref 12.0–15.0)
Immature Granulocytes: 1 %
Lymphocytes Relative: 22 %
Lymphs Abs: 1.4 10*3/uL (ref 0.7–4.0)
MCH: 33.8 pg (ref 26.0–34.0)
MCHC: 34.4 g/dL (ref 30.0–36.0)
MCV: 98.5 fL (ref 80.0–100.0)
Monocytes Absolute: 0.9 10*3/uL (ref 0.1–1.0)
Monocytes Relative: 15 %
Neutro Abs: 3.8 10*3/uL (ref 1.7–7.7)
Neutrophils Relative %: 59 %
Platelets: 197 10*3/uL (ref 150–400)
RBC: 3.9 MIL/uL (ref 3.87–5.11)
RDW: 14.6 % (ref 11.5–15.5)
WBC: 6.4 10*3/uL (ref 4.0–10.5)
nRBC: 0 % (ref 0.0–0.2)

## 2021-07-07 LAB — RESP PANEL BY RT-PCR (FLU A&B, COVID) ARPGX2
Influenza A by PCR: NEGATIVE
Influenza B by PCR: NEGATIVE
SARS Coronavirus 2 by RT PCR: POSITIVE — AB

## 2021-07-07 MED ORDER — LIDOCAINE VISCOUS HCL 2 % MT SOLN
15.0000 mL | Freq: Once | OROMUCOSAL | Status: AC
  Administered 2021-07-07: 15 mL via OROMUCOSAL
  Filled 2021-07-07: qty 15

## 2021-07-07 MED ORDER — KETOROLAC TROMETHAMINE 60 MG/2ML IM SOLN
30.0000 mg | Freq: Once | INTRAMUSCULAR | Status: AC
  Administered 2021-07-07: 30 mg via INTRAMUSCULAR
  Filled 2021-07-07: qty 2

## 2021-07-07 NOTE — ED Provider Notes (Signed)
[facility] EMERGENCY DEPARTMENT Provider Note   CSN: [id number] Arrival date & time: 07/06/21  2355     History Chief Complaint  Patient presents with   Headache    Sherri Burns is a 85 y.o. female.   Headache Pain location:  Generalized Quality:  Dull Radiates to:  Does not radiate Severity currently:  Unable to specify Severity at highest:  Unable to specify Onset quality:  Gradual Timing:  Constant Chronicity:  New Similar to prior headaches: no   Context: not activity, not exposure to bright light and not defecating       Past Medical History:  Diagnosis Date   Abnormal EKG 02/07/2016   Inferolateral T wave inversion and ST depression.   Acute calculous cholecystitis    Anxiety    Arthritis    Chronic diastolic CHF (congestive heart failure) ([place]) 02/21/2018   Colon polyps 11/2008   tubular adenoma   Diabetes mellitus    Esophageal problem    esophageal dilation   GERD (gastroesophageal reflux disease)    + hpylori   GI bleeding 01/2018   Headache    Hyperlipidemia    Hypertension    Hypothyroidism    Ischemic colitis [facility])    Renal cancer, right ([place]) 2010   s/p Rt nephrectomy by Dr. Rosana Hoes   Renal insufficiency     Patient Active Problem List   Diagnosis Date Noted   Lower abdominal pain 04/18/2021   Pressure injury of right heel, unstageable ([place]) 12/06/2020   Lower GI bleed 05/31/2020   Statin intolerance 11/12/2019   GAD (generalized anxiety disorder) 08/07/2019   Long term prescription benzodiazepine use 08/07/2019   GI bleed 04/10/2019   S/p nephrectomy 06/24/2018   Chronic diastolic CHF (congestive heart failure) ([place]) 02/21/2018   Acute lower GI bleeding 02/20/2018   Hyperlipidemia    Abnormal EKG 02/07/2016   Type 2 diabetes mellitus with diabetic polyneuropathy, without long-term current use of insulin ([place]) 09/23/2012   Essential hypertension    Gastroesophageal reflux disease without esophagitis     Hypothyroidism    CKD (chronic kidney disease), stage III ([place])     Past Surgical History:  Procedure Laterality Date   ABDOMINAL HYSTERECTOMY  1978   partial   CATARACT EXTRACTION     CHOLECYSTECTOMY N/A 04/04/2016   Procedure: LAPAROSCOPIC CHOLECYSTECTOMY;  Surgeon: Autumn Messing III, MD;  Location: [facility];  Service: General;  Laterality: N/A;   COLONOSCOPY     ESOPHAGEAL DILATION  2016   HERNIA REPAIR     LAPAROSCOPIC CHOLECYSTECTOMY  04/04/2016   NEPHRECTOMY Right 2010   Dr. Rosana Hoes, urology, due to renal cancer     OB History   No obstetric history on file.     Family History  Problem Relation Age of Onset   Cancer Mother        colon, kidney cancer   Cancer Father        throat cancer   Suicidality Son    Cancer Sister    Heart attack Brother     Social History   Tobacco Use   Smoking status: Some Days    Packs/day: 0.25    Years: 55.00    Pack years: 13.75    Types: Cigarettes   Smokeless tobacco: Never   Tobacco comments:    cut back amount still  trying to quit  Vaping Use   Vaping Use: Never used  Substance Use Topics   Alcohol use: No  Alcohol/week: 0.0 standard drinks   Drug use: No    Home Medications Prior to Admission medications   Medication Sig Start Date End Date Taking? Authorizing Provider  acetaminophen (TYLENOL) 325 MG tablet Take 2 tablets (650 mg total) by mouth every 6 (six) hours as needed for mild pain (or Fever >/= 101). Patient taking differently: Take 325 mg by mouth every 6 (six) hours as needed for mild pain. 06/02/20   Allie Bossier, MD  acetic acid 2 % otic solution Place 4 drops into both ears 3 (three) times daily. Patient taking differently: Place 4 drops into both ears 3 (three) times daily as needed (wax bulidup). 09/17/19   Jacelyn Pi, Lilia Argue, MD  allopurinol (ZYLOPRIM) 100 MG tablet Take 1 tablet (100 mg total) by mouth daily. Patient taking differently: Take 50 mg by mouth daily. 11/11/20   Trula Slade, DPM   amLODipine (NORVASC) 5 MG tablet Take 1 tablet (5 mg total) by mouth daily. 12/06/20   Just, Laurita Quint, FNP  brimonidine (ALPHAGAN) 0.2 % ophthalmic solution Place 1 drop into the left eye 2 (two) times daily as needed (Dry eyes). 10/27/19   [provider]  cholecalciferol (VITAMIN D3) 25 MCG (1000 UNIT) tablet Take 1,000 Units by mouth daily.    [provider]  clonazePAM (KLONOPIN) 1 MG tablet TAKE HALF TABLET BY MOUTH EVERY MORNING AND TAKE ONE TABLET BY MOUTH AT BEDTIME Patient not taking: Reported on 07/01/2021 12/14/20   Just, Laurita Quint, FNP  diclofenac Sodium (VOLTAREN) 1 % GEL Apply 2 g topically 3 (three) times daily as needed (for joint pain). 09/13/20   Nuala Alpha A, PA-C  dicyclomine (BENTYL) 10 MG capsule Take 1 capsule (10 mg total) by mouth 2 (two) times daily. 04/18/21   Zehr, Laban Emperor, PA-C  fluticasone (FLONASE) 50 MCG/ACT nasal spray Place 1-2 sprays into both nostrils daily. Patient taking differently: Place 1-2 sprays into both nostrils daily as needed for allergies. 06/17/20   Daleen Squibb, MD  furosemide (LASIX) 40 MG tablet Take 0.5 tablets (20 mg total) by mouth daily. Patient taking differently: Take 40 mg by mouth daily. 08/08/20   Jacelyn Pi, Lilia Argue, MD  glipiZIDE (GLUCOTROL) 10 MG tablet Take 20 mg by mouth 2 (two) times daily before a meal.    [provider]  levothyroxine (SYNTHROID) 75 MCG tablet Take 1 tablet (75 mcg total) by mouth daily before breakfast. 08/18/20   Jacelyn Pi, Lilia Argue, MD  metoprolol tartrate (LOPRESSOR) 100 MG tablet Take 1 tablet (100 mg total) by mouth 2 (two) times daily. 06/16/20   Daleen Squibb, MD  ondansetron (ZOFRAN) 4 MG tablet Take 1 tablet (4 mg total) by mouth every 6 (six) hours as needed for nausea. 06/02/20   Allie Bossier, MD  pantoprazole (PROTONIX) 40 MG tablet Take 1 tablet (40 mg total) by mouth daily. 04/18/21   Zehr, Laban Emperor, PA-C  polyethylene glycol (MIRALAX / GLYCOLAX) 17 g  packet Take 17 g by mouth daily as needed for mild constipation. 06/02/20   Allie Bossier, MD  vitamin E 100 UNIT capsule Take 100 Units by mouth daily.    [provider]    Allergies    Ace inhibitors, Codeine, Januvia [sitagliptin], Metformin and related, Ciprocin-fluocin-procin [fluocinolone acetonide], Clonidine derivatives, Crestor [rosuvastatin calcium], Penicillins, Tessalon perles, and Zocor [simvastatin]  Review of Systems   Review of Systems  Neurological:  Positive for headaches.  All other systems  reviewed and are negative.  Physical Exam Updated Vital Signs BP 139/79   Pulse 71   Temp 98.4 F (36.9 C) (Oral)   Resp 17   SpO2 95%   Physical Exam Vitals and nursing note reviewed.  Constitutional:      Appearance: She is well-developed.  HENT:     Head: Normocephalic and atraumatic.  Eyes:     General: No visual field deficit.    Extraocular Movements: Extraocular movements intact.     Pupils: Pupils are equal, round, and reactive to light.  Cardiovascular:     Rate and Rhythm: Normal rate and regular rhythm.  Pulmonary:     Effort: No respiratory distress.     Breath sounds: No stridor.  Abdominal:     General: There is no distension.  Musculoskeletal:        General: Normal range of motion.     Cervical back: Normal range of motion.  Skin:    General: Skin is warm and dry.  Neurological:     Mental Status: She is alert.     Cranial Nerves: No cranial nerve deficit or dysarthria.     Sensory: No sensory deficit.     Motor: No weakness.  Psychiatric:        Mood and Affect: Mood normal.    ED Results / Procedures / Treatments   Labs (all labs ordered are listed, but only abnormal results are displayed) Labs Reviewed  RESP PANEL BY RT-PCR (FLU A&B, COVID) ARPGX2 - Abnormal; Notable for the following components:      Result Value   SARS Coronavirus 2 by RT PCR POSITIVE (*)    All other components within normal limits  BASIC METABOLIC PANEL  - Abnormal; Notable for the following components:   Glucose, Bld 112 (*)    Creatinine, Ser 1.44 (*)    GFR, Estimated 36 (*)    All other components within normal limits  CBC WITH DIFFERENTIAL/PLATELET  TROPONIN I (HIGH SENSITIVITY)  TROPONIN I (HIGH SENSITIVITY)    EKG EKG Interpretation  Date/Time:  Friday July 07 2021 00:15:22 EDT Ventricular Rate:  101 PR Interval:  170 QRS Duration: 104 QT Interval:  330 QTC Calculation: 427 R Axis:   -5 Text Interpretation: Sinus tachycardia Moderate voltage criteria for LVH, may be normal variant ( R in aVL , Cornell product ) Nonspecific ST and T wave abnormality Abnormal ECG Confirmed by Merrily Pew [phone]) on 07/07/2021 5:20:37 AM  Radiology DG Chest 2 View  Result Date: 07/07/2021 CLINICAL DATA:  Cough, chills EXAM: CHEST - 2 VIEW COMPARISON:  06/20/2021 FINDINGS: Heart and mediastinal contours are within normal limits. No focal opacities or effusions. No acute bony abnormality. IMPRESSION: No active cardiopulmonary disease. Electronically Signed   By: Rolm Baptise M.D.   On: 07/07/2021 00:36    Procedures Procedures   Medications Ordered in ED Medications  ketorolac (TORADOL) injection 30 mg (30 mg Intramuscular Given 07/07/21 0537)  lidocaine (XYLOCAINE) 2 % viscous mouth solution 15 mL (15 mLs Mouth/Throat Given 07/07/21 0540)    ED Course  I have reviewed the triage vital signs and the nursing notes.  Pertinent labs & imaging results that were available during my care of the patient were reviewed by me and considered in my medical decision making (see chart for details).    MDM Rules/Calculators/A&P  Covid without indication for admission. Will refer to pharmacy for consideration of plaxovid.   Final Clinical Impression(s) / ED Diagnoses Final diagnoses:  Nonintractable headache, unspecified chronicity pattern, unspecified headache type  COVID-19    Rx / DC Orders ED Discharge Orders      None        Brylee Berk, Corene Cornea, MD 07/08/21 [phone]

## 2021-07-07 NOTE — ED Notes (Signed)
Pt also reports intermittent chest pain yesterday.

## 2021-07-07 NOTE — ED Triage Notes (Signed)
Pt c/o headache, chills, and body aches x 1 day.

## 2021-07-07 NOTE — ED Provider Notes (Signed)
Emergency Medicine Provider Triage Evaluation Note  Sherri Burns , a 85 y.o. female  was evaluated in triage.  Pt complains of cough and bodyaches.  Reports feeling bad.  Recent admission for hematochezia.  Took tylenol PTA.  Review of Systems  Positive: Chills, body aches Negative: Vomiting, diarrhea  Physical Exam  BP (!) 154/91 (BP Location: Right Arm)   Pulse 95   Temp 100.3 F (37.9 C)   Resp 20   SpO2 96%  Gen:   Awake, no distress   Resp:  Normal effort  MSK:   Moves extremities without difficulty  Other:    Medical Decision Making  Medically screening exam initiated at 12:11 AM.  Appropriate orders placed.  [name] was informed that the remainder of the evaluation will be completed by another provider, this initial triage assessment does not replace that evaluation, and the importance of remaining in the ED until their evaluation is complete.  Body aches   Montine Circle, PA-C 07/07/21 0012    Veryl Speak, MD 07/07/21 [phone]

## 2021-07-07 NOTE — Discharge Instructions (Addendum)
You may qualify for Paxlovid, a medication that can reduce the severity of COVID symptoms and increase your ability to survive the infection. However, you need to contact the pharmacy and complete a short telephone interview to determine your eligibility. You can contact either one of these two however there are no other pharmacies participating:  [facility], [address], [place], [place], [place] [address]  [facility], [address] [place], [place] [address]  The pharmacist will review some medications and health history and determine your need.   If your symptoms worsen, please return to the ED.

## 2021-07-13 ENCOUNTER — Encounter (HOSPITAL_COMMUNITY): Payer: Self-pay | Admitting: Emergency Medicine

## 2021-07-13 ENCOUNTER — Observation Stay (HOSPITAL_COMMUNITY)
Admission: EM | Admit: 2021-07-13 | Discharge: 2021-07-15 | Disposition: A | Discharge status: Home / Self Care | Payer: Medicare Other | Attending: Internal Medicine | Admitting: Internal Medicine

## 2021-07-13 ENCOUNTER — Observation Stay (HOSPITAL_COMMUNITY): Payer: Medicare Other

## 2021-07-13 DIAGNOSIS — N1831 Chronic kidney disease, stage 3a: Secondary | ICD-10-CM | POA: Diagnosis not present

## 2021-07-13 DIAGNOSIS — Z79899 Other long term (current) drug therapy: Secondary | ICD-10-CM | POA: Insufficient documentation

## 2021-07-13 DIAGNOSIS — Z7984 Long term (current) use of oral hypoglycemic drugs: Secondary | ICD-10-CM | POA: Insufficient documentation

## 2021-07-13 DIAGNOSIS — I10 Essential (primary) hypertension: Secondary | ICD-10-CM | POA: Diagnosis present

## 2021-07-13 DIAGNOSIS — N183 Chronic kidney disease, stage 3 unspecified: Secondary | ICD-10-CM | POA: Diagnosis present

## 2021-07-13 DIAGNOSIS — E039 Hypothyroidism, unspecified: Secondary | ICD-10-CM | POA: Diagnosis present

## 2021-07-13 DIAGNOSIS — I5032 Chronic diastolic (congestive) heart failure: Secondary | ICD-10-CM | POA: Diagnosis not present

## 2021-07-13 DIAGNOSIS — E1142 Type 2 diabetes mellitus with diabetic polyneuropathy: Secondary | ICD-10-CM | POA: Diagnosis not present

## 2021-07-13 DIAGNOSIS — I130 Hypertensive heart and chronic kidney disease with heart failure and stage 1 through stage 4 chronic kidney disease, or unspecified chronic kidney disease: Secondary | ICD-10-CM | POA: Insufficient documentation

## 2021-07-13 DIAGNOSIS — K922 Gastrointestinal hemorrhage, unspecified: Principal | ICD-10-CM | POA: Diagnosis present

## 2021-07-13 DIAGNOSIS — F17210 Nicotine dependence, cigarettes, uncomplicated: Secondary | ICD-10-CM | POA: Insufficient documentation

## 2021-07-13 DIAGNOSIS — Z85528 Personal history of other malignant neoplasm of kidney: Secondary | ICD-10-CM | POA: Diagnosis not present

## 2021-07-13 DIAGNOSIS — U071 COVID-19: Secondary | ICD-10-CM | POA: Diagnosis not present

## 2021-07-13 DIAGNOSIS — K625 Hemorrhage of anus and rectum: Secondary | ICD-10-CM | POA: Diagnosis present

## 2021-07-13 DIAGNOSIS — R1084 Generalized abdominal pain: Secondary | ICD-10-CM

## 2021-07-13 DIAGNOSIS — E1122 Type 2 diabetes mellitus with diabetic chronic kidney disease: Secondary | ICD-10-CM | POA: Insufficient documentation

## 2021-07-13 LAB — CBC WITH DIFFERENTIAL/PLATELET
Abs Immature Granulocytes: 0.05 10*3/uL (ref 0.00–0.07)
Basophils Absolute: 0 10*3/uL (ref 0.0–0.1)
Basophils Relative: 1 %
Eosinophils Absolute: 0.1 10*3/uL (ref 0.0–0.5)
Eosinophils Relative: 2 %
HCT: 41.1 % (ref 36.0–46.0)
Hemoglobin: 13.4 g/dL (ref 12.0–15.0)
Immature Granulocytes: 1 %
Lymphocytes Relative: 39 %
Lymphs Abs: 2.2 10*3/uL (ref 0.7–4.0)
MCH: 32.1 pg (ref 26.0–34.0)
MCHC: 32.6 g/dL (ref 30.0–36.0)
MCV: 98.6 fL (ref 80.0–100.0)
Monocytes Absolute: 0.3 10*3/uL (ref 0.1–1.0)
Monocytes Relative: 5 %
Neutro Abs: 2.9 10*3/uL (ref 1.7–7.7)
Neutrophils Relative %: 52 %
Platelets: 214 10*3/uL (ref 150–400)
RBC: 4.17 MIL/uL (ref 3.87–5.11)
RDW: 13.9 % (ref 11.5–15.5)
WBC: 5.6 10*3/uL (ref 4.0–10.5)
nRBC: 0 % (ref 0.0–0.2)

## 2021-07-13 LAB — TYPE AND SCREEN
ABO/RH(D): O POS
Antibody Screen: NEGATIVE

## 2021-07-13 LAB — CBG MONITORING, ED: Glucose-Capillary: 118 mg/dL — ABNORMAL HIGH (ref 70–99)

## 2021-07-13 LAB — COMPREHENSIVE METABOLIC PANEL
ALT: 16 U/L (ref 0–44)
AST: 25 U/L (ref 15–41)
Albumin: 3.9 g/dL (ref 3.5–5.0)
Alkaline Phosphatase: 48 U/L (ref 38–126)
Anion gap: 10 (ref 5–15)
BUN: 8 mg/dL (ref 8–23)
CO2: 27 mmol/L (ref 22–32)
Calcium: 9.3 mg/dL (ref 8.9–10.3)
Chloride: 103 mmol/L (ref 98–111)
Creatinine, Ser: 1.2 mg/dL — ABNORMAL HIGH (ref 0.44–1.00)
GFR, Estimated: 45 mL/min — ABNORMAL LOW (ref >60)
Glucose, Bld: 243 mg/dL — ABNORMAL HIGH (ref 70–99)
Potassium: 4.1 mmol/L (ref 3.5–5.1)
Sodium: 140 mmol/L (ref 135–145)
Total Bilirubin: 0.8 mg/dL (ref 0.3–1.2)
Total Protein: 7 g/dL (ref 6.5–8.1)

## 2021-07-13 LAB — LIPASE, BLOOD: Lipase: 37 U/L (ref 11–51)

## 2021-07-13 MED ORDER — ACETAMINOPHEN 325 MG PO TABS: 650.0000 mg | ORAL_TABLET | Freq: Four times a day (QID) | ORAL | Status: DC | PRN

## 2021-07-13 MED ORDER — ACETAMINOPHEN 650 MG RE SUPP: 650.0000 mg | Freq: Four times a day (QID) | RECTAL | Status: DC | PRN

## 2021-07-13 MED ORDER — AMLODIPINE BESYLATE 5 MG PO TABS
5.0000 mg | ORAL_TABLET | Freq: Once | ORAL | Status: AC
  Administered 2021-07-13: 5 mg via ORAL
  Filled 2021-07-13: qty 1

## 2021-07-13 MED ORDER — PANTOPRAZOLE SODIUM 40 MG PO TBEC
40.0000 mg | DELAYED_RELEASE_TABLET | Freq: Every day | ORAL | Status: DC
  Administered 2021-07-13 – 2021-07-15 (×3): 40 mg via ORAL
  Filled 2021-07-13 (×3): qty 1

## 2021-07-13 MED ORDER — INSULIN ASPART 100 UNIT/ML IJ SOLN
0.0000 [IU] | Freq: Three times a day (TID) | INTRAMUSCULAR | Status: DC
  Administered 2021-07-14: 1 [IU] via SUBCUTANEOUS
  Administered 2021-07-14 – 2021-07-15 (×3): 2 [IU] via SUBCUTANEOUS

## 2021-07-13 MED ORDER — METOPROLOL TARTRATE 100 MG PO TABS
100.0000 mg | ORAL_TABLET | Freq: Two times a day (BID) | ORAL | Status: DC
  Administered 2021-07-13 – 2021-07-15 (×4): 100 mg via ORAL
  Filled 2021-07-13: qty 4
  Filled 2021-07-13 (×3): qty 1

## 2021-07-13 MED ORDER — BRIMONIDINE TARTRATE 0.2 % OP SOLN
1.0000 [drp] | Freq: Two times a day (BID) | OPHTHALMIC | Status: DC | PRN
  Filled 2021-07-13: qty 5

## 2021-07-13 MED ORDER — LEVOTHYROXINE SODIUM 75 MCG PO TABS
75.0000 ug | ORAL_TABLET | Freq: Every day | ORAL | Status: DC
  Administered 2021-07-14 – 2021-07-15 (×2): 75 ug via ORAL
  Filled 2021-07-13 (×2): qty 1

## 2021-07-13 MED ORDER — ALLOPURINOL 100 MG PO TABS
50.0000 mg | ORAL_TABLET | Freq: Every day | ORAL | Status: DC
  Administered 2021-07-14 – 2021-07-15 (×2): 50 mg via ORAL
  Filled 2021-07-13 (×2): qty 1

## 2021-07-13 NOTE — H&P (Signed)
History and Physical    Sherri Burns DOB: [birth year] DOA: 07/13/2021  PCP: Arthur Holms, NP  Patient coming from: Home.  Chief Complaint: Rectal bleeding.  HPI: Sherri Burns is a 85 y.o. female with history of hypertension, diabetes mellitus type 2, chronic kidney disease stage III, hypothyroidism presents to the ER patient had rectal bleeding.  Patient states she has been having frank rectal bleeding off and on for the last few months but this morning had a large bowel movement which was frankly bloody after normal bowel movement.  Since mornings bowel movement with blood patient has been having intermittently bloody bowel movement which was painless.  Patient comes to the ER.  About a week ago patient was diagnosed with COVID and was symptomatically treated.  Presently denies any shortness of breath.  ED Course: In the ER patient had a further bowel movement which was mildly bloody after which had no further episodes.  Hemodynamically stable and patient's hemoglobin is around 13 which is at baseline.  Patient did have some epigastric discomfort but lasted briefly and had this time at the time of my exam he is abdominal pain-free.  Review of Systems: As per HPI, rest all negative.   Past Medical History:  Diagnosis Date   Abnormal EKG 02/07/2016   Inferolateral T wave inversion and ST depression.   Acute calculous cholecystitis    Anxiety    Arthritis    Chronic diastolic CHF (congestive heart failure) ([facility]) 02/21/2018   Colon polyps 11/2008   tubular adenoma   Diabetes mellitus    Esophageal problem    esophageal dilation   GERD (gastroesophageal reflux disease)    + hpylori   GI bleeding 01/2018   Headache    Hyperlipidemia    Hypertension    Hypothyroidism    Ischemic colitis [facility])    Renal cancer, right ([place]) 2010   s/p Rt nephrectomy by Dr. Rosana Hoes   Renal insufficiency     Past Surgical History:  Procedure Laterality Date   ABDOMINAL  HYSTERECTOMY  1978   partial   CATARACT EXTRACTION     CHOLECYSTECTOMY N/A 04/04/2016   Procedure: LAPAROSCOPIC CHOLECYSTECTOMY;  Surgeon: Autumn Messing III, MD;  Location: [facility];  Service: General;  Laterality: N/A;   COLONOSCOPY     ESOPHAGEAL DILATION  2016   [place]  04/04/2016   NEPHRECTOMY Right 2010   Dr. Rosana Hoes, urology, due to renal cancer     reports that she has been smoking cigarettes. She has a 13.75 pack-year smoking history. She has never used smokeless tobacco. She reports that she does not drink alcohol and does not use drugs.  Allergies  Allergen Reactions   Ace Inhibitors Swelling    See 02/16/19    Codeine Other (See Comments)    hallucinations   Januvia [Sitagliptin] Diarrhea   Metformin And Related     Upset stomach   Ciprocin-Fluocin-Procin [Fluocinolone Acetonide] Other (See Comments)    Unknown reaction   Clonidine Derivatives Other (See Comments)    Dry mouth   Crestor [Rosuvastatin Calcium] Other (See Comments)    cramps   Penicillins Other (See Comments)    Has patient had a PCN reaction causing immediate rash, facial/tongue/throat swelling, SOB or lightheadedness with hypotension: no Has patient had a PCN reaction causing severe rash involving mucus membranes or skin necrosis: no Has patient had a PCN reaction that required hospitalization : no Has patient had a PCN reaction  occurring within the last 10 years: no -Hallucinates If all of the above answers are "NO", then may proceed with Cephalosporin use.    Tessalon Perles Other (See Comments)    Gi upset    Zocor [Simvastatin] Other (See Comments)    Upset stomach     Family History  Problem Relation Age of Onset   Cancer Mother        colon, kidney cancer   Cancer Father        throat cancer   Suicidality Son    Cancer Sister    Heart attack Brother     Prior to Admission medications   Medication Sig Start Date End Date Taking? Authorizing Provider   acetaminophen (TYLENOL) 325 MG tablet Take 2 tablets (650 mg total) by mouth every 6 (six) hours as needed for mild pain (or Fever >/= 101). Patient taking differently: Take 325 mg by mouth every 6 (six) hours as needed for mild pain. 06/02/20  Yes Allie Bossier, MD  allopurinol (ZYLOPRIM) 100 MG tablet Take 1 tablet (100 mg total) by mouth daily. Patient taking differently: Take 50 mg by mouth daily. 11/11/20  Yes Trula Slade, DPM  brimonidine (ALPHAGAN) 0.2 % ophthalmic solution Place 1 drop into the left eye 2 (two) times daily as needed (Dry eyes). 10/27/19  Yes [provider]  cholecalciferol (VITAMIN D3) 25 MCG (1000 UNIT) tablet Take 1,000 Units by mouth daily.   Yes [provider]  furosemide (LASIX) 40 MG tablet Take 0.5 tablets (20 mg total) by mouth daily. Patient taking differently: Take 40 mg by mouth daily. 08/08/20  Yes Jacelyn Pi, Lilia Argue, MD  glipiZIDE (GLUCOTROL) 10 MG tablet Take 20 mg by mouth 2 (two) times daily before a meal.   Yes [provider]  levothyroxine (SYNTHROID) 75 MCG tablet Take 1 tablet (75 mcg total) by mouth daily before breakfast. 08/18/20  Yes Jacelyn Pi, Lilia Argue, MD  metoprolol tartrate (LOPRESSOR) 100 MG tablet Take 1 tablet (100 mg total) by mouth 2 (two) times daily. 06/16/20  Yes Jacelyn Pi, Lilia Argue, MD  ondansetron (ZOFRAN) 4 MG tablet Take 1 tablet (4 mg total) by mouth every 6 (six) hours as needed for nausea. 06/02/20  Yes Allie Bossier, MD  pantoprazole (PROTONIX) 40 MG tablet Take 1 tablet (40 mg total) by mouth daily. 04/18/21  Yes Zehr, Laban Emperor, PA-C  polyethylene glycol (MIRALAX / GLYCOLAX) 17 g packet Take 17 g by mouth daily as needed for mild constipation. 06/02/20  Yes Allie Bossier, MD  vitamin E 100 UNIT capsule Take 100 Units by mouth daily.   Yes [provider]  acetic acid 2 % otic solution Place 4 drops into both ears 3 (three) times daily. Patient not taking: Reported on 07/13/2021  09/17/19   Jacelyn Pi, Lilia Argue, MD  amLODipine (NORVASC) 5 MG tablet Take 1 tablet (5 mg total) by mouth daily. Patient not taking: Reported on 07/13/2021 12/06/20   Just, Laurita Quint, FNP  clonazePAM (KLONOPIN) 1 MG tablet TAKE HALF TABLET BY MOUTH EVERY MORNING AND TAKE ONE TABLET BY MOUTH AT BEDTIME Patient not taking: No sig reported 12/14/20   Just, Laurita Quint, FNP  diclofenac Sodium (VOLTAREN) 1 % GEL Apply 2 g topically 3 (three) times daily as needed (for joint pain). Patient not taking: Reported on 07/13/2021 09/13/20   Nuala Alpha A, PA-C  dicyclomine (BENTYL) 10 MG capsule Take 1 capsule (10 mg total) by mouth 2 (two)  times daily. Patient not taking: Reported on 07/13/2021 04/18/21   Zehr, Janett Billow D, PA-C  fluticasone [facility]) 50 MCG/ACT nasal spray Place 1-2 sprays into both nostrils daily. Patient taking differently: Place 1-2 sprays into both nostrils daily as needed for allergies. 06/17/20   Daleen Squibb, MD    Physical Exam: Constitutional: Moderately built and nourished. Vitals:   07/13/21 1800 07/13/21 1815 07/13/21 1830 07/13/21 1845  BP: (!) 174/89 (!) 168/86 (!) 174/84 (!) 169/89  Pulse: 64 62 65 (!) 58  Resp: (!) '21 16 17 20  '$ Temp:      SpO2: 100% 100% 100% 100%   Eyes: Anicteric no pallor. ENMT: No discharge from the ears eyes nose and mouth. Neck: No mass felt.  No neck rigidity. Respiratory: No rhonchi or crepitations. Cardiovascular: S1-S2 heard. Abdomen: Soft nontender bowel sound present. Musculoskeletal: No edema. Skin: No rash. Neurologic: Alert awake oriented to time place and person.  Moves all extremities. Psychiatric: Appears normal.  Normal affect.   Labs on Admission: I have personally reviewed following labs and imaging studies  CBC: Recent Labs  Lab 07/07/21 0010 07/13/21 1207  WBC 6.4 5.6  NEUTROABS 3.8 2.9  HGB 13.2 13.4  HCT 38.4 41.1  MCV 98.5 98.6  PLT 197 [id number]   Basic Metabolic Panel: Recent Labs  Lab 07/07/21 0010  07/13/21 1207  NA 137 140  K 3.9 4.1  CL 104 103  CO2 25 27  GLUCOSE 112* 243*  BUN 21 8  CREATININE 1.44* 1.20*  CALCIUM 9.1 9.3   GFR: Estimated Creatinine Clearance: 38.3 mL/min (A) (by C-G formula based on SCr of 1.2 mg/dL (H)). Liver Function Tests: Recent Labs  Lab 07/13/21 1207  AST 25  ALT 16  ALKPHOS 48  BILITOT 0.8  PROT 7.0  ALBUMIN 3.9   Recent Labs  Lab 07/13/21 1207  LIPASE 37   No results for input(s): AMMONIA in the last 168 hours. Coagulation Profile: No results for input(s): INR, PROTIME in the last 168 hours. Cardiac Enzymes: No results for input(s): CKTOTAL, CKMB, CKMBINDEX, TROPONINI in the last 168 hours. BNP (last 3 results) No results for input(s): PROBNP in the last 8760 hours. HbA1C: No results for input(s): HGBA1C in the last 72 hours. CBG: No results for input(s): GLUCAP in the last 168 hours. Lipid Profile: No results for input(s): CHOL, HDL, LDLCALC, TRIG, CHOLHDL, LDLDIRECT in the last 72 hours. Thyroid Function Tests: No results for input(s): TSH, T4TOTAL, FREET4, T3FREE, THYROIDAB in the last 72 hours. Anemia Panel: No results for input(s): VITAMINB12, FOLATE, FERRITIN, TIBC, IRON, RETICCTPCT in the last 72 hours. Urine analysis:    Component Value Date/Time   COLORURINE YELLOW 06/20/2021 1243   APPEARANCEUR CLEAR 06/20/2021 1243   LABSPEC 1.012 06/20/2021 1243   PHURINE 7.0 06/20/2021 1243   GLUCOSEU NEGATIVE 06/20/2021 1243   HGBUR NEGATIVE 06/20/2021 1243   BILIRUBINUR NEGATIVE 06/20/2021 1243   BILIRUBINUR negative 12/06/2020 1634   BILIRUBINUR small 11/09/2014 [place] 06/20/2021 1243   PROTEINUR NEGATIVE 06/20/2021 1243   UROBILINOGEN 0.2 12/06/2020 1634   UROBILINOGEN 0.2 04/28/2013 2321   NITRITE NEGATIVE 06/20/2021 1243   LEUKOCYTESUR NEGATIVE 06/20/2021 1243   Sepsis Labs: '@LABRCNTIP'$ (procalcitonin:4,lacticidven:4) ) Recent Results (from the past 240 hour(s))  Resp Panel by RT-PCR (Flu A&B,  Covid) Nasopharyngeal Swab     Status: Abnormal   Collection Time: 07/07/21  1:14 AM   Specimen: Nasopharyngeal Swab; Nasopharyngeal(NP) swabs in vial transport medium  Result Value Ref  Range Status   SARS Coronavirus 2 by RT PCR POSITIVE (A) NEGATIVE Final    Comment: RESULT CALLED TO, READ BACK BY AND VERIFIED WITH: C,STRAUGHAN '@0213'$  07/07/21 EB (NOTE) SARS-CoV-2 target nucleic acids are DETECTED.  The SARS-CoV-2 RNA is generally detectable in upper respiratory specimens during the acute phase of infection. Positive results are indicative of the presence of the identified virus, but do not rule out bacterial infection or co-infection with other pathogens not detected by the test. Clinical correlation with patient history and other diagnostic information is necessary to determine patient infection status. The expected result is Negative.  Fact Sheet for Patients: EntrepreneurPulse.com.au  Fact Sheet for Healthcare Providers: IncredibleEmployment.be  This test is not yet approved or cleared by the [place] FDA and  has been authorized for detection and/or diagnosis of SARS-CoV-2 by FDA under an Emergency Use Authorization (EUA).  This EUA will remain in effect (meaning this test can be used ) for the duration of  the COVID-19 declaration under Section 564(b)(1) of the Act, 21 U.S.C. section 360bbb-3(b)(1), unless the authorization is terminated or revoked sooner.     Influenza A by PCR NEGATIVE NEGATIVE Final   Influenza B by PCR NEGATIVE NEGATIVE Final    Comment: (NOTE) The Xpert Xpress SARS-CoV-2/FLU/RSV plus assay is intended as an aid in the diagnosis of influenza from Nasopharyngeal swab specimens and should not be used as a sole basis for treatment. Nasal washings and aspirates are unacceptable for Xpert Xpress SARS-CoV-2/FLU/RSV testing.  Fact Sheet for Patients: EntrepreneurPulse.com.au  Fact Sheet for  Healthcare Providers: IncredibleEmployment.be  This test is not yet approved or cleared by the [place] FDA and has been authorized for detection and/or diagnosis of SARS-CoV-2 by FDA under an Emergency Use Authorization (EUA). This EUA will remain in effect (meaning this test can be used) for the duration of the COVID-19 declaration under Section 564(b)(1) of the Act, 21 U.S.C. section 360bbb-3(b)(1), unless the authorization is terminated or revoked.  Performed at [facility], [place] [address] [place], [place] [address]      Radiological Exams on Admission: No results found.   Assessment/Plan Principal Problem:   Acute GI bleeding Active Problems:   Essential hypertension   Hypothyroidism   CKD (chronic kidney disease), stage III ([place])   Type 2 diabetes mellitus with diabetic polyneuropathy, without long-term current use of insulin ([facility])    Acute GI bleeding -patient has has bright red blood per rectum likely lower GI.  Patient has had EGD and colonoscopy in 2020 by Dr. Tarri Glenn of [facility] which showed some polyps and EGD was unremarkable.  Patient denies taking any aspirin or NSAIDs.  We will check serial CBC keep patient on clear liquid diet.  Consult [facility]. Recently positive for COVID last week presently asymptomatic. Hypertension on amlodipine and metoprolol. Hypothyroidism on Synthroid. Diabetes mellitus type 2 on sliding scale coverage. Chronic kidney disease stage III creatinine appears to be at baseline.   DVT prophylaxis: SCD.  Avoiding anticoagulation in the setting of GI bleed. Code Status: Full code. Family Communication: Discussed with patient. Disposition Plan: Home. Consults called:  GI will be notified. Admission status: Observation.   Rise Patience MD [facility] Pager [phone].  If 7PM-7AM, please contact night-coverage [email or web address] Password [facility]  07/13/2021, 8:57 PM

## 2021-07-13 NOTE — ED Triage Notes (Signed)
Patient BIB [facility] from home where she lives with daughter. Complains of bright red blood mixed with stool this morning after bowel movement. Reports multiple similar episodes over the last few weeks. Is supposed to follow up with gastroenterologist and history of diverticulitis but has not been seen yet. Patient alert, oriented, and in no apparent distress.

## 2021-07-13 NOTE — ED Provider Notes (Signed)
[name] EMERGENCY DEPARTMENT Provider Note   CSN: [id number] Arrival date & time: 07/13/21  1155     History Chief Complaint  Patient presents with  . Abdominal Pain    Sherri Burns is a 85 y.o. female.   Abdominal Pain Associated symptoms: no chest pain, no chills, no cough, no dysuria, no fever, no hematuria, no nausea, no shortness of breath, no sore throat and no vomiting    85 year old female with a past medical history of ischemic colitis, diverticulosis, previous GI bleeding, hypertension, hyperlipidemia, CHF presenting to the emergency department with bright blood per rectum.  Patient reports that she was in her normal state of health today until this afternoon when she had a large volume of blood per rectum.  She states that it was bright red mixed with dark red, no melena.  She states that afterward she felt lightheaded.  She reports that after that, she placed a pad in her underwear and had some continued bleeding on the pad when she checked.  She had a bowel movement in our emergency department that had some brown but was still tinged with some bright red.  She denies any shortness of breath or chest pain.  She states that she tested positive for COVID-19 6 days ago, but was recovering well from that illness and did not have any continued fever or cough.  She states that she was referred to gastroenterology but has not been able to see them yet.  Past Medical History:  Diagnosis Date  . Abnormal EKG 02/07/2016   Inferolateral T wave inversion and ST depression.  . Acute calculous cholecystitis   . Anxiety   . Arthritis   . Chronic diastolic CHF (congestive heart failure) ([place]) 02/21/2018  . Colon polyps 11/2008   tubular adenoma  . Diabetes mellitus   . Esophageal problem    esophageal dilation  . GERD (gastroesophageal reflux disease)    + hpylori  . GI bleeding 01/2018  . Headache   . Hyperlipidemia   . Hypertension   . Hypothyroidism    . Ischemic colitis ([place])   . Renal cancer, right ([place]) 2010   s/p Rt nephrectomy by Dr. Rosana Hoes  . Renal insufficiency     Patient Active Problem List   Diagnosis Date Noted  . Acute GI bleeding 07/13/2021  . Lower abdominal pain 04/18/2021  . Pressure injury of right heel, unstageable ([place]) 12/06/2020  . Lower GI bleed 05/31/2020  . Statin intolerance 11/12/2019  . GAD (generalized anxiety disorder) 08/07/2019  . Long term prescription benzodiazepine use 08/07/2019  . GI bleed 04/10/2019  . S/p nephrectomy 06/24/2018  . Chronic diastolic CHF (congestive heart failure) ([place]) 02/21/2018  . Acute lower GI bleeding 02/20/2018  . Hyperlipidemia   . Abnormal EKG 02/07/2016  . Type 2 diabetes mellitus with diabetic polyneuropathy, without long-term current use of insulin ([place]) 09/23/2012  . Essential hypertension   . Gastroesophageal reflux disease without esophagitis   . Hypothyroidism   . CKD (chronic kidney disease), stage III [facility])     Past Surgical History:  Procedure Laterality Date  . ABDOMINAL HYSTERECTOMY  1978   partial  . CATARACT EXTRACTION    . CHOLECYSTECTOMY N/A 04/04/2016   Procedure: LAPAROSCOPIC CHOLECYSTECTOMY;  Surgeon: Autumn Messing III, MD;  Location: [facility];  Service: General;  Laterality: N/A;  . COLONOSCOPY    . ESOPHAGEAL DILATION  2016  . HERNIA REPAIR    . LAPAROSCOPIC CHOLECYSTECTOMY  04/04/2016  . NEPHRECTOMY  Right 2010   Dr. Rosana Hoes, urology, due to renal cancer     OB History   No obstetric history on file.     Family History  Problem Relation Age of Onset  . Cancer Mother        colon, kidney cancer  . Cancer Father        throat cancer  . Suicidality Son   . Cancer Sister   . Heart attack Brother     Social History   Tobacco Use  . Smoking status: Some Days    Packs/day: 0.25    Years: 55.00    Pack years: 13.75    Types: Cigarettes  . Smokeless tobacco: Never  . Tobacco comments:    cut back amount still  trying to quit  Vaping  Use  . Vaping Use: Never used  Substance Use Topics  . Alcohol use: No    Alcohol/week: 0.0 standard drinks  . Drug use: No    Home Medications Prior to Admission medications   Medication Sig Start Date End Date Taking? Authorizing Provider  acetaminophen (TYLENOL) 325 MG tablet Take 2 tablets (650 mg total) by mouth every 6 (six) hours as needed for mild pain (or Fever >/= 101). Patient taking differently: Take 325 mg by mouth every 6 (six) hours as needed for mild pain. 06/02/20  Yes Allie Bossier, MD  allopurinol (ZYLOPRIM) 100 MG tablet Take 1 tablet (100 mg total) by mouth daily. Patient taking differently: Take 50 mg by mouth daily. 11/11/20  Yes Trula Slade, DPM  brimonidine (ALPHAGAN) 0.2 % ophthalmic solution Place 1 drop into the left eye 2 (two) times daily as needed (Dry eyes). 10/27/19  Yes [provider]  cholecalciferol (VITAMIN D3) 25 MCG (1000 UNIT) tablet Take 1,000 Units by mouth daily.   Yes [provider]  furosemide (LASIX) 40 MG tablet Take 0.5 tablets (20 mg total) by mouth daily. Patient taking differently: Take 40 mg by mouth daily. 08/08/20  Yes Jacelyn Pi, Lilia Argue, MD  glipiZIDE (GLUCOTROL) 10 MG tablet Take 20 mg by mouth 2 (two) times daily before a meal.   Yes [provider]  levothyroxine (SYNTHROID) 75 MCG tablet Take 1 tablet (75 mcg total) by mouth daily before breakfast. 08/18/20  Yes Jacelyn Pi, Lilia Argue, MD  metoprolol tartrate (LOPRESSOR) 100 MG tablet Take 1 tablet (100 mg total) by mouth 2 (two) times daily. 06/16/20  Yes Jacelyn Pi, Lilia Argue, MD  ondansetron (ZOFRAN) 4 MG tablet Take 1 tablet (4 mg total) by mouth every 6 (six) hours as needed for nausea. 06/02/20  Yes Allie Bossier, MD  pantoprazole (PROTONIX) 40 MG tablet Take 1 tablet (40 mg total) by mouth daily. 04/18/21  Yes Zehr, Laban Emperor, PA-C  polyethylene glycol (MIRALAX / GLYCOLAX) 17 g packet Take 17 g by mouth daily as needed for mild constipation.  06/02/20  Yes Allie Bossier, MD  vitamin E 100 UNIT capsule Take 100 Units by mouth daily.   Yes [provider]  acetic acid 2 % otic solution Place 4 drops into both ears 3 (three) times daily. Patient not taking: Reported on 07/13/2021 09/17/19   Jacelyn Pi, Lilia Argue, MD  amLODipine (NORVASC) 5 MG tablet Take 1 tablet (5 mg total) by mouth daily. Patient not taking: Reported on 07/13/2021 12/06/20   Just, Laurita Quint, FNP  clonazePAM (KLONOPIN) 1 MG tablet TAKE HALF TABLET BY MOUTH EVERY MORNING AND TAKE ONE TABLET BY MOUTH  AT BEDTIME Patient not taking: No sig reported 12/14/20   Just, Laurita Quint, FNP  diclofenac Sodium (VOLTAREN) 1 % GEL Apply 2 g topically 3 (three) times daily as needed (for joint pain). Patient not taking: Reported on 07/13/2021 09/13/20   Nuala Alpha A, PA-C  dicyclomine (BENTYL) 10 MG capsule Take 1 capsule (10 mg total) by mouth 2 (two) times daily. Patient not taking: Reported on 07/13/2021 04/18/21   Zehr, Janett Billow D, PA-C  fluticasone [facility]) 50 MCG/ACT nasal spray Place 1-2 sprays into both nostrils daily. Patient taking differently: Place 1-2 sprays into both nostrils daily as needed for allergies. 06/17/20   Daleen Squibb, MD    Allergies    Ace inhibitors, Codeine, Januvia [sitagliptin], Metformin and related, Ciprocin-fluocin-procin [fluocinolone acetonide], Clonidine derivatives, Crestor [rosuvastatin calcium], Penicillins, Tessalon perles, and Zocor [simvastatin]  Review of Systems   Review of Systems  Constitutional:  Negative for chills and fever.  HENT:  Negative for ear pain and sore throat.   Eyes:  Negative for pain and visual disturbance.  Respiratory:  Negative for cough and shortness of breath.   Cardiovascular:  Negative for chest pain and palpitations.  Gastrointestinal:  Positive for abdominal pain, anal bleeding and blood in stool. Negative for nausea and vomiting.  Genitourinary:  Negative for dysuria and hematuria.   Musculoskeletal:  Negative for arthralgias and back pain.  Skin:  Negative for color change and rash.  Neurological:  Negative for seizures and syncope.  All other systems reviewed and are negative.  Physical Exam Updated Vital Signs BP 121/86 (BP Location: Right Arm)   Pulse 63   Temp 98.2 F (36.8 C)   Resp (!) 22   SpO2 99%   Physical Exam Vitals and nursing note reviewed.  Constitutional:      General: She is not in acute distress.    Appearance: She is well-developed.  HENT:     Head: Normocephalic and atraumatic.  Eyes:     Conjunctiva/sclera: Conjunctivae normal.  Cardiovascular:     Rate and Rhythm: Normal rate and regular rhythm.     Heart sounds: No murmur heard. Pulmonary:     Effort: Pulmonary effort is normal. No respiratory distress.     Breath sounds: Normal breath sounds.  Abdominal:     Palpations: Abdomen is soft.     Tenderness: There is no abdominal tenderness. There is no right CVA tenderness, left CVA tenderness, guarding or rebound. Negative signs include Murphy's sign.  Genitourinary:    Comments: No gross blood on the rectal exam, but does have blood on the pad Musculoskeletal:     Cervical back: Neck supple.  Skin:    General: Skin is warm and dry.     Capillary Refill: Capillary refill takes less than 2 seconds.  Neurological:     Mental Status: She is alert.    ED Results / Procedures / Treatments   Labs (all labs ordered are listed, but only abnormal results are displayed) Labs Reviewed  COMPREHENSIVE METABOLIC PANEL - Abnormal; Notable for the following components:      Result Value   Glucose, Bld 243 (*)    Creatinine, Ser 1.20 (*)    GFR, Estimated 45 (*)    All other components within normal limits  CBG MONITORING, ED - Abnormal; Notable for the following components:   Glucose-Capillary 118 (*)    All other components within normal limits  CBC WITH DIFFERENTIAL/PLATELET  LIPASE, BLOOD  CBC  CBC  CBC  TYPE AND SCREEN     EKG None  Radiology DG CHEST PORT 1 VIEW  Result Date: 07/13/2021 CLINICAL DATA:  COVID 19. EXAM: PORTABLE CHEST 1 VIEW COMPARISON:  July 07, 2021 FINDINGS: The heart size and mediastinal contours are within normal limits. Both lungs are clear. The visualized skeletal structures are unremarkable. IMPRESSION: No acute cardiopulmonary disease. Electronically Signed   By: Virgina Norfolk M.D.   On: 07/13/2021 20:55    Procedures Procedures   Medications Ordered in ED Medications  allopurinol (ZYLOPRIM) tablet 50 mg (has no administration in time range)  metoprolol tartrate (LOPRESSOR) tablet 100 mg (100 mg Oral Given 07/13/21 2216)  levothyroxine (SYNTHROID) tablet 75 mcg (has no administration in time range)  pantoprazole (PROTONIX) EC tablet 40 mg (40 mg Oral Given 07/13/21 2216)  brimonidine (ALPHAGAN) 0.2 % ophthalmic solution 1 drop (has no administration in time range)  insulin aspart (novoLOG) injection 0-9 Units (has no administration in time range)  acetaminophen (TYLENOL) tablet 650 mg (has no administration in time range)    Or  acetaminophen (TYLENOL) suppository 650 mg (has no administration in time range)  amLODipine (NORVASC) tablet 5 mg (5 mg Oral Given 07/13/21 1819)    ED Course  I have reviewed the triage vital signs and the nursing notes.  Pertinent labs & imaging results that were available during my care of the patient were reviewed by me and considered in my medical decision making (see chart for details).    MDM Rules/Calculators/A&P                           85 year old female with previous GI bleed presenting to the emergency department the prior blood per rectum.  Vital signs reviewed, within acceptable limits.  Physical exam is notable for some generalized abdominal tenderness but no focal tenderness, no blood on rectal exam.  There is blood on the patient's underwear.  This is the patient's second presentation for similar bright red blood per rectum  in the last 2 weeks.  She has a history of diverticulosis and ischemic colitis.  Patient does not describe any episodes of likely hypotension, suspect diverticular bleed.  I do not believe she is having a brisk upper GI bleed.  Hemoglobin obtained in triage is that it is overall stable.  However, the patient felt significantly lightheaded after passing a large amount of blood at home and passed more blood here in the emergency department.  Given that her bleeding is recurrent she does not seem to be resolving spontaneously as the natural history of most lower GI bleeds, I believe that she warrants admission for observation, hemoglobin trending, and possibly further management depending on the course of her hospital stay.  She has not been able to see GI as an outpatient.  Hospitalist consulted for admission.  Final Clinical Impression(s) / ED Diagnoses Final diagnoses:  Generalized abdominal pain    Rx / DC Orders ED Discharge Orders     None        Claud Kelp, MD 07/14/21 0030    Malvin Johns, MD 07/14/21 [phone]

## 2021-07-13 NOTE — ED Provider Notes (Signed)
Emergency Medicine Provider Triage Evaluation Note  Sherri Burns , a 85 y.o. female  was evaluated in triage.  Pt complains of hematochezia.  Patient reports that this morning she had a bowel movement with a large amount of bright red blood mixed with her stool.  Patient reports similar episodes of the last few weeks.  Patient reports history of diverticulitis and one follow-up visit with gastroenterology due to her hematochezia.  Patient endorses lightheadedness, generalized fatigue, and lower abdominal pain.  Patient is not on any blood thinners  Additionally patient tested positive for COVID-19 6 days prior.  Patient endorses rhinorrhea, nasal congestion, chills, productive cough.  Patient reports history of hypertension states that she did not have her blood pressure medication today.  Review of Systems  Positive: hinorrhea, nasal congestion, chills, productive cough, abdominal pain, hematochezia, Negative: Fever, nausea, vomiting, syncope, chest pain, shortness of breath  Physical Exam  BP (!) 170/83 (BP Location: Left Arm)   Pulse 81   Temp 98.9 F (37.2 C)   Resp 18   SpO2 98%  Gen:   Awake, no distress   Resp:  Normal effort  MSK:   Moves extremities without difficulty  Other:  Abdomen soft, nondistended, mild tenderness across lower abdomen, no guarding or rebound tenderness.  +2 radial pulse bilaterally.  Medical Decision Making  Medically screening exam initiated at 12:07 PM.  Appropriate orders placed.  [name] was informed that the remainder of the evaluation will be completed by another provider, this initial triage assessment does not replace that evaluation, and the importance of remaining in the ED until their evaluation is complete.  The patient appears stable so that the remainder of the work up may be completed by another provider.      Dyann Ruddle 07/13/21 1209    Valarie Merino, MD 07/15/21 [phone]

## 2021-07-13 NOTE — ED Notes (Signed)
Pt resting comfortably at this time. VS WNL. This RN spoke with pts daughter on the phone. Pt denies complaints at this time.

## 2021-07-14 ENCOUNTER — Encounter (HOSPITAL_COMMUNITY): Payer: Self-pay | Admitting: Internal Medicine

## 2021-07-14 DIAGNOSIS — K922 Gastrointestinal hemorrhage, unspecified: Secondary | ICD-10-CM | POA: Diagnosis not present

## 2021-07-14 LAB — CBC
HCT: 39.1 % (ref 36.0–46.0)
Hemoglobin: 13.2 g/dL (ref 12.0–15.0)
MCH: 32.4 pg (ref 26.0–34.0)
MCHC: 33.8 g/dL (ref 30.0–36.0)
MCV: 95.8 fL (ref 80.0–100.0)
Platelets: 202 10*3/uL (ref 150–400)
RBC: 4.08 MIL/uL (ref 3.87–5.11)
RDW: 13.8 % (ref 11.5–15.5)
WBC: 6.2 10*3/uL (ref 4.0–10.5)
nRBC: 0 % (ref 0.0–0.2)

## 2021-07-14 LAB — GLUCOSE, CAPILLARY
Glucose-Capillary: 168 mg/dL — ABNORMAL HIGH (ref 70–99)
Glucose-Capillary: 143 mg/dL — ABNORMAL HIGH (ref 70–99)
Glucose-Capillary: 180 mg/dL — ABNORMAL HIGH (ref 70–99)
Glucose-Capillary: 185 mg/dL — ABNORMAL HIGH (ref 70–99)

## 2021-07-14 MED ORDER — PSYLLIUM 95 % PO PACK
1.0000 | PACK | Freq: Every day | ORAL | Status: DC
  Administered 2021-07-14: 1 via ORAL
  Filled 2021-07-14 (×2): qty 1

## 2021-07-14 MED ORDER — AMLODIPINE BESYLATE 5 MG PO TABS
5.0000 mg | ORAL_TABLET | Freq: Once | ORAL | Status: AC
  Administered 2021-07-14: 5 mg via ORAL
  Filled 2021-07-14: qty 1

## 2021-07-14 MED ORDER — HYDROCORTISONE ACETATE 25 MG RE SUPP
25.0000 mg | Freq: Two times a day (BID) | RECTAL | Status: DC
  Administered 2021-07-14 – 2021-07-15 (×3): 25 mg via RECTAL
  Filled 2021-07-14 (×3): qty 1

## 2021-07-14 MED ORDER — HYDROCORTISONE ACETATE 25 MG RE SUPP: 25.0000 mg | Freq: Two times a day (BID) | RECTAL | 0 refills | Status: DC

## 2021-07-14 NOTE — ED Notes (Signed)
Report given to Tedra Coupe, RN of [phone]

## 2021-07-14 NOTE — Consult Note (Addendum)
[facility] Consult: 11:06 AM 07/14/2021  LOS: 0 days    Referring Provider: Dr Nena Alexander  Primary Care Physician:  Arthur Holms, NP Primary Gastroenterologist:  Dr. Tarri Glenn     Reason for Consultation:  bleeding PR.     HPI: Sherri Burns is a 85 y.o. female.  PMH hypertension.  Hypothyroidism.  NIDDM.  Diastolic CHF.  Cholecystectomy 2017.  Partial abdominal hysterectomy 1978.  Renal cell carcinoma, right nephrectomy 1982.  Umbilical herniorrhaphy.   . No blood thinners, aspirin products, antiplatelet agents, NSAIDs.  Takes daily PPI.  Bleeding hemorrhoids, previous hemorrhoidal bandings by Dr. Earlean Shawl.  Dysphagia.  Ischemic colitis 2001, recurrent.. Colon polyps.  Colonoscopies: per Dr Earlean Shawl: 03/12/2011: 2 tubular adenomas on the pathology result. 03/28/2015: one small polyp was seen. Diverticulosis. Surveillance recommended in 5 years. 10/23/2016 for rectal bleeding: 4 small polyps, sigmoid diverticulosis, and internal Hemorrhoids. 2016 EGD with empiric dilation.  Patient complained of dysphagia. 06/2019 colonoscopy: A total of 10 polyps were removed.  All sessile, 1 to 2 mm in size.  Path: TAs.  Nonbleeding internal hemorrhoids.  No mention of diverticulosis 06/2019 EGD.  For hematemesis.  Esophagus normal.  Nodular mucosa at gastric body, pathology.  Biopsy of a few gastric polyps. Path: Mild, chronic gastritis, PPI effect.  No H. pylori.  Reactive gastropathy.  Benign SB mucosa W lymphoid aggregate. 05/26/2020 MBS swallow study: Normal oropharyngeal swallow.  Hesitation with pill entering GE junction but entered the stomach after swallows of liquids, additional time.  Deemed mild aspiration risk.  Cleared for regular solid food and thin liquids..  At baseline patient occasionally sees blood with wiping.  On  8 6 had more major bleeding associated with LLQ pain.   07/01/21 CTAP w/o contrast: Fatty left inguinal hernia.  Sigmoid diverticulosis.  Tested Covid 19 + on 8/12.  C/O nasal congestion, rhinorrhea, productive cough, chills.  Respiratory symptoms have resolved. Within the past week has seen blood with wiping.  Yesterday had a large volume hematochezia with pain in her lower abdomen.  No nausea or vomiting.  She passed the blood along with stool in mid morning and describes this as a large volume which filled the commode.  It kept running out over the course of at least a half an hour to an hour.  She put a pad on and EMS arrived.  When she got to the emergency room the pad had some blood on it but was not saturated.  This morning she had a brown stool and saw blood with wiping but no blood dripping into the commode water.  Still having some pain bilaterally in the lower abdomen, worse on the left.  No nausea or vomiting.  No constipation, no straining. She gets very anxious when she sees this volume of blood.  She does not have any topical therapies at home she can use for hemorrhoids.    Vital signs stable.  Initially with some mild hypertension but heart rate in the 50s to 60s.  Excellent room air sats.  No fever. Hgb quite stable.  It was 13.9 in early May.  13.3 on 8/5, 13.2 on 8/12 and 13.4 yesterday afternoon.  MCV 98.  Platelets and WBC count normal.  INR normal. BUN normal.  Creatinine 1.2 which is improved from 1.4 one week ago.  GFR reduced at 45, improved from 36.  Currently lives at her daughter's home.  She is in process of trying to MT her house of many years so that she can moved into a senior living apartment.  Gave birth to a children.  At least one child, her son is dead.  Widowed x3       Past Medical History:  Diagnosis Date   Abnormal EKG 02/07/2016   Inferolateral T wave inversion and ST depression.   Acute calculous cholecystitis    Anxiety    Arthritis    Chronic diastolic  CHF (congestive heart failure) ([place]) 02/21/2018   Colon polyps 11/2008   tubular adenoma   Diabetes mellitus    Esophageal problem    esophageal dilation   GERD (gastroesophageal reflux disease)    + hpylori   GI bleeding 01/2018   Headache    Hyperlipidemia    Hypertension    Hypothyroidism    Ischemic colitis [facility])    Renal cancer, right ([place]) 2010   s/p Rt nephrectomy by Dr. Rosana Hoes   Renal insufficiency     Past Surgical History:  Procedure Laterality Date   ABDOMINAL HYSTERECTOMY  1978   partial   CATARACT EXTRACTION     CHOLECYSTECTOMY N/A 04/04/2016   Procedure: LAPAROSCOPIC CHOLECYSTECTOMY;  Surgeon: Autumn Messing III, MD;  Location: [facility];  Service: General;  Laterality: N/A;   COLONOSCOPY     ESOPHAGEAL DILATION  2016   HERNIA REPAIR     LAPAROSCOPIC CHOLECYSTECTOMY  04/04/2016   NEPHRECTOMY Right 1982   Dr. Rosana Hoes, urology, due to renal cancer    Prior to Admission medications   Medication Sig Start Date End Date Taking? Authorizing Provider  acetaminophen (TYLENOL) 325 MG tablet Take 2 tablets (650 mg total) by mouth every 6 (six) hours as needed for mild pain (or Fever >/= 101). Patient taking differently: Take 325 mg by mouth every 6 (six) hours as needed for mild pain. 06/02/20  Yes Allie Bossier, MD  allopurinol (ZYLOPRIM) 100 MG tablet Take 1 tablet (100 mg total) by mouth daily. Patient taking differently: Take 50 mg by mouth daily. 11/11/20  Yes Trula Slade, DPM  brimonidine (ALPHAGAN) 0.2 % ophthalmic solution Place 1 drop into the left eye 2 (two) times daily as needed (Dry eyes). 10/27/19  Yes [provider]  cholecalciferol (VITAMIN D3) 25 MCG (1000 UNIT) tablet Take 1,000 Units by mouth daily.   Yes [provider]  furosemide (LASIX) 40 MG tablet Take 0.5 tablets (20 mg total) by mouth daily. Patient taking differently: Take 40 mg by mouth daily. 08/08/20  Yes Jacelyn Pi, Lilia Argue, MD  glipiZIDE (GLUCOTROL) 10 MG tablet Take 20 mg  by mouth 2 (two) times daily before a meal.   Yes [provider]  levothyroxine (SYNTHROID) 75 MCG tablet Take 1 tablet (75 mcg total) by mouth daily before breakfast. 08/18/20  Yes Jacelyn Pi, Lilia Argue, MD  metoprolol tartrate (LOPRESSOR) 100 MG tablet Take 1 tablet (100 mg total) by mouth 2 (two) times daily. 06/16/20  Yes Jacelyn Pi, Lilia Argue, MD  ondansetron (ZOFRAN) 4 MG tablet Take 1 tablet (4 mg total) by mouth every 6 (six) hours as needed for nausea. 06/02/20  Yes Allie Bossier, MD  pantoprazole (PROTONIX) 40 MG tablet Take 1 tablet (40 mg total) by mouth daily. 04/18/21  Yes Zehr, Laban Emperor, PA-C  polyethylene glycol (MIRALAX / GLYCOLAX) 17 g packet Take 17 g by mouth daily as needed for mild constipation. 06/02/20  Yes Allie Bossier, MD  vitamin E 100 UNIT capsule Take 100 Units by mouth daily.   Yes [provider]  acetic acid 2 % otic solution Place 4 drops into both ears 3 (three) times daily. Patient not taking: Reported on 07/13/2021 09/17/19   Jacelyn Pi, Lilia Argue, MD  amLODipine (NORVASC) 5 MG tablet Take 1 tablet (5 mg total) by mouth daily. Patient not taking: Reported on 07/13/2021 12/06/20   Just, Laurita Quint, FNP  clonazePAM (KLONOPIN) 1 MG tablet TAKE HALF TABLET BY MOUTH EVERY MORNING AND TAKE ONE TABLET BY MOUTH AT BEDTIME Patient not taking: No sig reported 12/14/20   Just, Laurita Quint, FNP  diclofenac Sodium (VOLTAREN) 1 % GEL Apply 2 g topically 3 (three) times daily as needed (for joint pain). Patient not taking: Reported on 07/13/2021 09/13/20   Nuala Alpha A, PA-C  dicyclomine (BENTYL) 10 MG capsule Take 1 capsule (10 mg total) by mouth 2 (two) times daily. Patient not taking: Reported on 07/13/2021 04/18/21   Zehr, Janett Billow D, PA-C  fluticasone [facility]) 50 MCG/ACT nasal spray Place 1-2 sprays into both nostrils daily. Patient taking differently: Place 1-2 sprays into both nostrils daily as needed for allergies. 06/17/20   Daleen Squibb, MD     Scheduled Meds:  allopurinol  50 mg Oral Daily   insulin aspart  0-9 Units Subcutaneous TID WC   levothyroxine  75 mcg Oral QAC breakfast   metoprolol tartrate  100 mg Oral BID   pantoprazole  40 mg Oral Daily   Infusions:  PRN Meds: acetaminophen **OR** acetaminophen, brimonidine   Allergies as of 07/13/2021 - Review Complete 07/13/2021  Allergen Reaction Noted   Ace inhibitors Swelling 02/16/2019   Codeine Other (See Comments) 01/17/2012   Januvia [sitagliptin] Diarrhea 09/13/2020   Metformin and related  02/20/2018   Ciprocin-fluocin-procin [fluocinolone acetonide] Other (See Comments) 01/17/2012   Clonidine derivatives Other (See Comments) 01/17/2012   Crestor [rosuvastatin calcium] Other (See Comments) 07/28/2015   Penicillins Other (See Comments) 01/17/2012   Tessalon perles Other (See Comments) 01/17/2012   Zocor [simvastatin] Other (See Comments) 04/21/2013    Family History  Problem Relation Age of Onset   Cancer Mother        colon, kidney cancer   Cancer Father        throat cancer   Suicidality Son    Cancer Sister    Heart attack Brother     Social History   Socioeconomic History   Marital status: Widowed    Spouse name: Not on file   Number of children: 2   Years of education: Not on file   Highest education level: Not on file  Occupational History   Not on file  Tobacco Use   Smoking status: Some Days    Packs/day: 0.25    Years: 55.00    Pack years: 13.75    Types: Cigarettes   Smokeless tobacco: Never   Tobacco comments:    cut back amount still  trying to quit  Vaping Use   Vaping Use: Never used  Substance and Sexual Activity   Alcohol use: No    Alcohol/week: 0.0 standard drinks   Drug use: No  Sexual activity: Not Currently  Other Topics Concern   Not on file  Social History Narrative   Loss of son.   Social Determinants of Health   Financial Resource Strain: Not on file  Food Insecurity: Not on file  Transportation  Needs: Not on file  Physical Activity: Not on file  Stress: Not on file  Social Connections: Not on file  Intimate Partner Violence: Not on file    REVIEW OF SYSTEMS: Constitutional: No weakness, no dizziness. ENT:  No nose bleeds Pulm: Shortness of breath.  No cough. CV:  No palpitations, no LE edema.  Angina. GU:  No hematuria, no frequency GI: See HPI. Heme: Other than the rectal bleeding, denies unusual or excessive bleeding or bruising. Transfusions:  none Neuro:  No headaches, no peripheral tingling or numbness.  No syncope, no seizures. Psych:   Anxiety around her having to divested herself of contents of her home and the fact that she is not supposed to do heavy lifting, worries about her family.   Derm:  No itching, no rash or sores.  Endocrine:  No sweats or chills.  No polyuria or dysuria Immunization: She has been vaccinated for COVID-19. Travel:  None beyond local counties in last few months.    PHYSICAL EXAM: Vital signs in last 24 hours: Vitals:   07/14/21 0715 07/14/21 0807  BP: (!) 149/86 (!) 166/86  Pulse: (!) 57 68  Resp: 18 20  Temp:  97.7 F (36.5 C)  SpO2: 96% 97%   Wt Readings from Last 3 Encounters:  07/14/21 74.9 kg  06/30/21 78.9 kg  06/20/21 78.9 kg    General: Cooperative, pleasant.  Looks well.  Slightly anxious.  Comfortable. Head: No facial asymmetry or swelling.  No signs of head trauma. Eyes: No conjunctival pallor Ears: Not hard of hearing Nose: No congestion or discharge Mouth: Oropharynx moist, pink, clear.  Tongue midline. Neck: No JVD, no masses, no thyromegaly. Lungs: Clear bilaterally. Heart: Irregularly irregular.  Later review of telemetry with heart rate in the low 60s, sinus rhythm.  No MRG. Abdomen: Soft, ND.  Active bowel sounds.  Tenderness without guarding in the lower left quadrant..   Rectal: External, nonthrombosed hemorrhoids which are somewhat erythematous.  No active bleeding.  DRE with light brown stool and no  visible blood on exam finger. Musc/Skeltl: No joint redness, swelling or gross deformities. Extremities: No CCE. Neurologic: Oriented x3.  Good historian.  Moves all 4 limbs without weakness or tremor. Skin: No rash, no sores, no telangiectasia   Psych: Cooperative, calm, pleasant.  Becomes slightly anxious at times when describing current situation.  Intake/Output from previous day: No intake/output data recorded. Intake/Output this shift: No intake/output data recorded.  LAB RESULTS: Recent Labs    07/13/21 1207 07/14/21 0837  WBC 5.6 6.2  HGB 13.4 13.2  HCT 41.1 39.1  PLT 214 202   BMET Lab Results  Component Value Date   NA 140 07/13/2021   NA 137 07/07/2021   NA 139 06/30/2021   K 4.1 07/13/2021   K 3.9 07/07/2021   K 4.6 06/30/2021   CL 103 07/13/2021   CL 104 07/07/2021   CL 102 06/30/2021   CO2 27 07/13/2021   CO2 25 07/07/2021   CO2 29 06/30/2021   GLUCOSE 243 (H) 07/13/2021   GLUCOSE 112 (H) 07/07/2021   GLUCOSE 210 (H) 06/30/2021   BUN 8 07/13/2021   BUN 21 07/07/2021   BUN 14 06/30/2021   CREATININE 1.20 (H)  07/13/2021   CREATININE 1.44 (H) 07/07/2021   CREATININE 1.33 (H) 06/30/2021   CALCIUM 9.3 07/13/2021   CALCIUM 9.1 07/07/2021   CALCIUM 9.4 06/30/2021   LFT Recent Labs    07/13/21 1207  PROT 7.0  ALBUMIN 3.9  AST 25  ALT 16  ALKPHOS 48  BILITOT 0.8   PT/INR Lab Results  Component Value Date   INR 1.0 12/09/2019   INR 1.0 04/11/2019   INR 0.98 12/29/2015   Hepatitis Panel No results for input(s): HEPBSAG, HCVAB, HEPAIGM, HEPBIGM in the last 72 hours. C-Diff No components found for: CDIFF Lipase     Component Value Date/Time   LIPASE 37 07/13/2021 1207    Drugs of Abuse  No results found for: LABOPIA, COCAINSCRNUR, LABBENZ, AMPHETMU, THCU, LABBARB   RADIOLOGY STUDIES: DG CHEST PORT 1 VIEW  Result Date: 07/13/2021 CLINICAL DATA:  COVID 63. EXAM: PORTABLE CHEST 1 VIEW COMPARISON:  July 07, 2021 FINDINGS: The heart  size and mediastinal contours are within normal limits. Both lungs are clear. The visualized skeletal structures are unremarkable. IMPRESSION: No acute cardiopulmonary disease. Electronically Signed   By: Virgina Norfolk M.D.   On: 07/13/2021 20:55      IMPRESSION:     BRB per rectum.  Previous sessions of hemorrhoidal banding 20+ years ago.  Intermittent minor bleeding PR.  History recurrent adenomatous polyps.   Last colonoscopy with 10 polyps removed in August 2020.  Study also noted nonbleeding internal hemorrhoids.  No associated anemia, Hgb stable.       PLAN:       Add Anusol HC PR.   Patient could discharge home but she gets very anxious when she thinks about recurrence of large-volume bleeding and what to do if it recurs.  Advanced diet to carb modified.   Added psyllium   Azucena Freed  07/14/2021, 11:06 AM Phone [phone]   Attending Physician's Attestation   I have taken an interval history, reviewed the chart and examined the patient.   85 y/o fm with recurrent episodes of profuse bright red blood per rectum with significant drop in hemoglobin or change in hemodynamics.  This is most consistent with hemorrhoidal bleeding although diverticular bleed remains possible as well.  I don't think she needs another colonoscopy as she had one 2 years ago and has had both hemorrhoids and diverticulosis noted.  The last time she passed blood was 11 am yesterday.   Recommend observation for further bleeding and trending CBCs.  If not drop in hgb, would discharge home and we can perform hemorrhoid banding in the clinic. If she does have significant bleeding while here with drop in hgb or change in hemodynamics, I would recommend CT-A/IR. I agree with the Advanced Practitioner's note, impression, and recommendations with updates and my documentation above.   Dustin Flock, MD [facility]

## 2021-07-14 NOTE — Progress Notes (Signed)
PROGRESS NOTE                                                                                                                                                                                                             Patient Demographics:    Sherri Burns, is a 85 y.o. female, DOB - 05-27-1936, [id number]  Outpatient Primary MD for the patient is Arthur Holms, NP   Admit date - 07/13/2021   LOS - 0  Chief Complaint  Patient presents with   Abdominal Pain       Brief Narrative: Patient is a 85 y.o. female with PMHx of HTN, DM-2, CKD stage III, hypothyroidism who presented with hematochezia.  Consults: GI  DVT prophylaxis: SCDs Start: 07/13/21 2056    Subjective:    Gaspar Garbe today 1 brown stools.   Assessment  & Plan :   Lower GI bleeding/hematochezia: High suspicion for hemorrhoids.  Hemoglobin stable without any evidence of blood loss anemia.  Furthermore-hematochezia has [name] has had a episode of brown stool this morning.  GI recommending observation for another day to ensure stability before discharge.  Continue Anusol.     Recent COVID-19 infection: Diagnosed 8/12-currently asymptomatic.  Not an active infection.   HTN: BP stable-continue amlodipine/metoprolol   Hypothyroidism: Continue Synthroid   CKD stage IIIa: Creatinine close to baseline   DM-2: CBG stable with SSI-plan to resume oral hypoglycemics on discharge.  ABG:    Component Value Date/Time   HCO3 28.0 (H) 09/30/2007 0706   TCO2 30 09/30/2007 0706    Vent Settings: N/A  Condition - Stable  Family Communication  : None at bedside  Code Status :  Full Code  Diet :  Diet Order             Diet Carb Modified Fluid consistency: Thin; Room service appropriate? Yes  Diet effective now           Diet - low sodium heart healthy           Diet Carb Modified                    Disposition Plan  :    Status is: Observation  The patient remains OBS appropriate and will d/c before 2 midnights.  Dispo: The patient is from: Home  Anticipated d/c is to: Home              Patient currently is not medically stable to d/c.   Difficult to place patient No     Barriers to discharge: Hematochezia-needs inpatient monitoring for another 24 hours-to ensure stability-before consideration of discharge.  Antimicorbials  :    Anti-infectives (From admission, onward)    None       Inpatient Medications  Scheduled Meds:  allopurinol  50 mg Oral Daily   hydrocortisone  25 mg Rectal BID   insulin aspart  0-9 Units Subcutaneous TID WC   levothyroxine  75 mcg Oral QAC breakfast   metoprolol tartrate  100 mg Oral BID   pantoprazole  40 mg Oral Daily   psyllium  1 packet Oral Daily   Continuous Infusions: PRN Meds:.acetaminophen **OR** acetaminophen, brimonidine   Time Spent in minutes  25  See all Orders from today for further details   Oren Binet M.D on 07/14/2021 at 1:41 PM  To page go to [email or web address] - use universal password  [facility] -  Office  [phone]    Objective:   Vitals:   07/14/21 0700 07/14/21 0715 07/14/21 0807 07/14/21 1215  BP: (!) 169/92 (!) 149/86 (!) 166/86 (!) 167/89  Pulse: 73 (!) 57 68 60  Resp: '19 18 20 19  '$ Temp: 98 F (36.7 C)  97.7 F (36.5 C) (!) 97.4 F (36.3 C)  TempSrc:   Oral Oral  SpO2: 95% 96% 97%   Weight:   74.9 kg   Height:   5' 5.98" (1.676 m)     Wt Readings from Last 3 Encounters:  07/14/21 74.9 kg  06/30/21 78.9 kg  06/20/21 78.9 kg    No intake or output data in the 24 hours ending 07/14/21 1341   Physical Exam Gen Exam:Alert awake-not in any distress HEENT:atraumatic, normocephalic Chest: B/L clear to auscultation anteriorly CVS:S1S2 regular Abdomen:soft non tender, non distended Extremities:no edema Neurology: Non focal Skin: no rash   Data Review:    CBC Recent Labs  Lab  07/13/21 1207 07/14/21 0837  WBC 5.6 6.2  HGB 13.4 13.2  HCT 41.1 39.1  PLT 214 202  MCV 98.6 95.8  MCH 32.1 32.4  MCHC 32.6 33.8  RDW 13.9 13.8  LYMPHSABS 2.2  --   MONOABS 0.3  --   EOSABS 0.1  --   BASOSABS 0.0  --     Chemistries  Recent Labs  Lab 07/13/21 1207  NA 140  K 4.1  CL 103  CO2 27  GLUCOSE 243*  BUN 8  CREATININE 1.20*  CALCIUM 9.3  AST 25  ALT 16  ALKPHOS 48  BILITOT 0.8   ------------------------------------------------------------------------------------------------------------------ No results for input(s): CHOL, HDL, LDLCALC, TRIG, CHOLHDL, LDLDIRECT in the last 72 hours.  Lab Results  Component Value Date   HGBA1C 7.8 (H) 12/06/2020   ------------------------------------------------------------------------------------------------------------------ No results for input(s): TSH, T4TOTAL, T3FREE, THYROIDAB in the last 72 hours.  Invalid input(s): FREET3 ------------------------------------------------------------------------------------------------------------------ No results for input(s): VITAMINB12, FOLATE, FERRITIN, TIBC, IRON, RETICCTPCT in the last 72 hours.  Coagulation profile No results for input(s): INR, PROTIME in the last 168 hours.  No results for input(s): DDIMER in the last 72 hours.  Cardiac Enzymes No results for input(s): CKMB, TROPONINI, MYOGLOBIN in the last 168 hours.  Invalid input(s): CK ------------------------------------------------------------------------------------------------------------------    Component Value Date/Time   BNP 36.0 04/10/2019 1715    Micro Results Recent Results (from the past 240 hour(s))  Resp Panel by RT-PCR (Flu A&B, Covid) Nasopharyngeal Swab     Status: Abnormal   Collection Time: 07/07/21  1:14 AM   Specimen: Nasopharyngeal Swab; Nasopharyngeal(NP) swabs in vial transport medium  Result Value Ref Range Status   SARS Coronavirus 2 by RT PCR POSITIVE (A) NEGATIVE Final     Comment: RESULT CALLED TO, READ BACK BY AND VERIFIED WITH: C,STRAUGHAN '@0213'$  07/07/21 EB (NOTE) SARS-CoV-2 target nucleic acids are DETECTED.  The SARS-CoV-2 RNA is generally detectable in upper respiratory specimens during the acute phase of infection. Positive results are indicative of the presence of the identified virus, but do not rule out bacterial infection or co-infection with other pathogens not detected by the test. Clinical correlation with patient history and other diagnostic information is necessary to determine patient infection status. The expected result is Negative.  Fact Sheet for Patients: EntrepreneurPulse.com.au  Fact Sheet for Healthcare Providers: IncredibleEmployment.be  This test is not yet approved or cleared by the [place] FDA and  has been authorized for detection and/or diagnosis of SARS-CoV-2 by FDA under an Emergency Use Authorization (EUA).  This EUA will remain in effect (meaning this test can be used ) for the duration of  the COVID-19 declaration under Section 564(b)(1) of the Act, 21 U.S.C. section 360bbb-3(b)(1), unless the authorization is terminated or revoked sooner.     Influenza A by PCR NEGATIVE NEGATIVE Final   Influenza B by PCR NEGATIVE NEGATIVE Final    Comment: (NOTE) The Xpert Xpress SARS-CoV-2/FLU/RSV plus assay is intended as an aid in the diagnosis of influenza from Nasopharyngeal swab specimens and should not be used as a sole basis for treatment. Nasal washings and aspirates are unacceptable for Xpert Xpress SARS-CoV-2/FLU/RSV testing.  Fact Sheet for Patients: EntrepreneurPulse.com.au  Fact Sheet for Healthcare Providers: IncredibleEmployment.be  This test is not yet approved or cleared by the [place] FDA and has been authorized for detection and/or diagnosis of SARS-CoV-2 by FDA under an Emergency Use Authorization (EUA). This EUA will  remain in effect (meaning this test can be used) for the duration of the COVID-19 declaration under Section 564(b)(1) of the Act, 21 U.S.C. section 360bbb-3(b)(1), unless the authorization is terminated or revoked.  Performed at [facility], [place] [address] [place], [place] [address]     Radiology Reports CT ABDOMEN PELVIS WO CONTRAST  Result Date: 07/01/2021 CLINICAL DATA:  Rectal bleeding x2.  Diverticulitis suspected EXAM: CT ABDOMEN AND PELVIS WITHOUT CONTRAST TECHNIQUE: Multidetector CT imaging of the abdomen and pelvis was performed following the standard protocol without IV contrast. COMPARISON:  03/28/2021 FINDINGS: Lower chest:  No contributory findings. Hepatobiliary: No focal liver abnormality.Cholecystectomy. No bile duct dilatation. Pancreas: Unremarkable. Spleen: Unremarkable. Adrenals/Urinary Tract: Negative adrenals. Right nephrectomy no hydronephrosis or stone. Unremarkable bladder. Stomach/Bowel: No obstruction. No appendicitis. Uncomplicated duodenal diverticulum. Sigmoid diverticulosis. Vascular/Lymphatic: No acute vascular abnormality. No mass or adenopathy. Reproductive:No pathologic findings. Other: No ascites or pneumoperitoneum.  Fatty left inguinal hernia. Musculoskeletal: No acute abnormalities. Prominent spinal degeneration with L4-5 and L5-S1 anterolisthesis. IMPRESSION: 1. No acute finding or change from May 2022. 2. Sigmoid diverticulosis. 3. Fatty left inguinal hernia. Electronically Signed   By: Monte Fantasia M.D.   On: 07/01/2021 07:32   DG Chest 2 View  Result Date: 07/07/2021 CLINICAL DATA:  Cough, chills EXAM: CHEST - 2 VIEW COMPARISON:  06/20/2021 FINDINGS: Heart and mediastinal contours are within normal limits. No focal opacities or effusions. No acute bony abnormality. IMPRESSION: No active cardiopulmonary disease. Electronically Signed  By: Rolm Baptise M.D.   On: 07/07/2021 00:36   DG Chest 2 View  Result Date: 06/20/2021 CLINICAL DATA:  Chest  pain. EXAM: CHEST - 2 VIEW COMPARISON:  03/28/2021. FINDINGS: Mediastinum hilar structures normal. Heart size normal. No focal infiltrate. No pleural effusion or pneumothorax. Degenerative change thoracic spine. No acute bony abnormality. Surgical clips upper abdomen. IMPRESSION: 1.  No acute cardiopulmonary disease. 2.  No acute bony abnormality.  No pneumothorax. Electronically Signed   By: Marcello Moores  Register   On: 06/20/2021 08:46   DG Shoulder Right  Result Date: 06/15/2021 CLINICAL DATA:  Right shoulder pain after fall. Fall 3 days ago with axillary pain. EXAM: RIGHT SHOULDER - 2+ VIEW COMPARISON:  None. FINDINGS: There is no evidence of fracture or dislocation. Mild acromioclavicular and glenohumeral spurring. Soft tissues are unremarkable. EKG lead overlies the greater tuberosity on the AP view. IMPRESSION: No fracture or dislocation of the right shoulder. Mild osteoarthritis. Electronically Signed   By: Keith Rake M.D.   On: 06/15/2021 22:55   DG Abdomen 1 View  Result Date: 06/20/2021 CLINICAL DATA:  85 year old female with small bowel obstruction EXAM: ABDOMEN - 1 VIEW COMPARISON:  Abdominal CT 03/28/2021, 05/31/2020, plain film 04/11/2019 FINDINGS: Gas within stomach small bowel and colon.  No abnormal distention. No air-fluid levels. Formed stool within the left colon and rectum. Surgical changes in the upper abdomen and mid right abdomen. Vascular calcifications. No unexpected radiopaque foreign body. No unexpected soft tissue density or calcification. Pelvic phleboliths. No displaced fracture IMPRESSION: No evidence of bowel obstruction. Electronically Signed   By: Corrie Mckusick D.O.   On: 06/20/2021 11:01   CT Head Wo Contrast  Result Date: 06/15/2021 CLINICAL DATA:  Fall EXAM: CT HEAD WITHOUT CONTRAST CT CERVICAL SPINE WITHOUT CONTRAST TECHNIQUE: Multidetector CT imaging of the head and cervical spine was performed following the standard protocol without intravenous contrast.  Multiplanar CT image reconstructions of the cervical spine were also generated. COMPARISON:  None. FINDINGS: CT HEAD FINDINGS Brain: There is no mass, hemorrhage or extra-axial collection. The size and configuration of the ventricles and extra-axial CSF spaces are normal. The brain parenchyma is normal, without evidence of acute or chronic infarction. Vascular: No abnormal hyperdensity of the major intracranial arteries or dural venous sinuses. No intracranial atherosclerosis. Skull: The visualized skull base, calvarium and extracranial soft tissues are normal. Sinuses/Orbits: No fluid levels or advanced mucosal thickening of the visualized paranasal sinuses. No mastoid or middle ear effusion. The orbits are normal. CT CERVICAL SPINE FINDINGS Alignment: Grade 1 retrolisthesis at C5-6. Facets are aligned. Occipital condyles are normally positioned. Skull base and vertebrae: No acute fracture. Soft tissues and spinal canal: No prevertebral fluid or swelling. No visible canal hematoma. Disc levels: No advanced spinal canal or neural foraminal stenosis. Upper chest: No pneumothorax, pulmonary nodule or pleural effusion. Other: Normal visualized paraspinal cervical soft tissues. IMPRESSION: 1. No acute intracranial abnormality. 2. No acute fracture or static subluxation of the cervical spine. Electronically Signed   By: Ulyses Jarred M.D.   On: 06/15/2021 23:26   CT Cervical Spine Wo Contrast  Result Date: 06/15/2021 CLINICAL DATA:  Fall EXAM: CT HEAD WITHOUT CONTRAST CT CERVICAL SPINE WITHOUT CONTRAST TECHNIQUE: Multidetector CT imaging of the head and cervical spine was performed following the standard protocol without intravenous contrast. Multiplanar CT image reconstructions of the cervical spine were also generated. COMPARISON:  None. FINDINGS: CT HEAD FINDINGS Brain: There is no mass, hemorrhage or extra-axial collection. The size and configuration  of the ventricles and extra-axial CSF spaces are normal. The  brain parenchyma is normal, without evidence of acute or chronic infarction. Vascular: No abnormal hyperdensity of the major intracranial arteries or dural venous sinuses. No intracranial atherosclerosis. Skull: The visualized skull base, calvarium and extracranial soft tissues are normal. Sinuses/Orbits: No fluid levels or advanced mucosal thickening of the visualized paranasal sinuses. No mastoid or middle ear effusion. The orbits are normal. CT CERVICAL SPINE FINDINGS Alignment: Grade 1 retrolisthesis at C5-6. Facets are aligned. Occipital condyles are normally positioned. Skull base and vertebrae: No acute fracture. Soft tissues and spinal canal: No prevertebral fluid or swelling. No visible canal hematoma. Disc levels: No advanced spinal canal or neural foraminal stenosis. Upper chest: No pneumothorax, pulmonary nodule or pleural effusion. Other: Normal visualized paraspinal cervical soft tissues. IMPRESSION: 1. No acute intracranial abnormality. 2. No acute fracture or static subluxation of the cervical spine. Electronically Signed   By: Ulyses Jarred M.D.   On: 06/15/2021 23:26   DG CHEST PORT 1 VIEW  Result Date: 07/13/2021 CLINICAL DATA:  COVID 19. EXAM: PORTABLE CHEST 1 VIEW COMPARISON:  July 07, 2021 FINDINGS: The heart size and mediastinal contours are within normal limits. Both lungs are clear. The visualized skeletal structures are unremarkable. IMPRESSION: No acute cardiopulmonary disease. Electronically Signed   By: Virgina Norfolk M.D.   On: 07/13/2021 20:55

## 2021-07-14 NOTE — Discharge Summary (Signed)
PATIENT DETAILS Name: Sherri Burns Age: 85 y.o. Sex: female Date of Birth: [birth year] MRN: [id number]. Admitting Physician: Sherri Patience, MD [id number], Sherri Mercury, NP  Admit Date: 07/13/2021 Discharge date: 07/15/2021  Recommendations for Outpatient Follow-up:  Follow up with PCP in 1-2 weeks Please obtain CMP/CBC in one week Please ensure follow-up with GI  Admitted From:  Home  Disposition: [place]: No  Equipment/Devices: None  Discharge Condition: Stable  CODE STATUS: FULL CODE  Diet recommendation:  Diet Order             Diet Carb Modified Fluid consistency: Thin; Room service appropriate? Yes  Diet effective now           Diet - low sodium heart healthy           Diet Carb Modified                    Brief Summary: See H&P, Labs, Consult and Test reports for all details in brief, patient is a 85 year old female with history of HTN, DM-2, CKD stage IIIa who presented with hematochezia.  Brief Hospital Course: Lower GI bleeding/hematochezia: High suspicion for hemorrhoids.  Hemoglobin stable without any evidence of blood loss anemia.  Furthermore-hematochezia has Sherri Burns has had a episode of brown stool this morning.  Reassurance provided.  No further recommendations by GI.  Will discharge on Anusol suppository for a few days.  Recent COVID-19 infection: Diagnosed 8/12-currently asymptomatic.  Not an active infection.  HTN: BP stable-continue amlodipine/metoprolol  Hypothyroidism: Continue Synthroid  CKD stage IIIa: Creatinine close to baseline  DM-2: CBG stable-resume oral hypoglycemic agents as previous.  Rest of her medical problems were stable during his short overnight hospital stay.   Procedures None  Discharge Diagnoses:  Principal Problem:   Acute GI bleeding Active Problems:   Essential hypertension   Hypothyroidism   CKD (chronic kidney disease), stage III ([place])   Type 2 diabetes mellitus with  diabetic polyneuropathy, without long-term current use of insulin [facility])   Discharge Instructions:  Activity:  As tolerated    Discharge Instructions     Call MD for:   Complete by: As directed    Recurrent rectal bleeding   Diet - low sodium heart healthy   Complete by: As directed    Diet Carb Modified   Complete by: As directed    Discharge instructions   Complete by: As directed    Follow with Primary MD  Sherri Holms, NP in 1-2 weeks  Please get a complete blood count and chemistry panel checked by your Primary MD at your next visit, and again as instructed by your Primary MD.  Get Medicines reviewed and adjusted: Please take all your medications with you for your next visit with your Primary MD  Laboratory/radiological data: Please request your Primary MD to go over all hospital tests and procedure/radiological results at the follow up, please ask your Primary MD to get all Hospital records sent to his/her office.  In some cases, they will be blood work, cultures and biopsy results pending at the time of your discharge. Please request that your primary care M.D. follows up on these results.  Also Note the following: If you experience worsening of your admission symptoms, develop shortness of breath, life threatening emergency, suicidal or homicidal thoughts you must seek medical attention immediately by calling 911 or calling your MD immediately  if symptoms less severe.  You must read complete instructions/literature along with all  the possible adverse reactions/side effects for all the Medicines you take and that have been prescribed to you. Take any new Medicines after you have completely understood and accpet all the possible adverse reactions/side effects.   Do not drive when taking Pain medications or sleeping medications (Benzodaizepines)  Do not take more than prescribed Pain, Sleep and Anxiety Medications. It is not advisable to combine anxiety,sleep and pain  medications without talking with your primary care practitioner  Special Instructions: If you have smoked or chewed Tobacco  in the last 2 yrs please stop smoking, stop any regular Alcohol  and or any Recreational drug use.  Wear Seat belts while driving.  Please note: You were cared for by a hospitalist during your hospital stay. Once you are discharged, your primary care physician will handle any further medical issues. Please note that NO REFILLS for any discharge medications will be authorized once you are discharged, as it is imperative that you return to your primary care physician (or establish a relationship with a primary care physician if you do not have one) for your post hospital discharge needs so that they can reassess your need for medications and monitor your lab values.   Increase activity slowly   Complete by: As directed       Allergies as of 07/15/2021       Reactions   Ace Inhibitors Swelling   See 02/16/19    Codeine Other (See Comments)   hallucinations   Januvia [sitagliptin] Diarrhea   Metformin And Related    Upset stomach   Ciprocin-fluocin-procin [fluocinolone Acetonide] Other (See Comments)   Unknown reaction   Clonidine Derivatives Other (See Comments)   Dry mouth   Crestor [rosuvastatin Calcium] Other (See Comments)   cramps   Penicillins Other (See Comments)   Has patient had a PCN reaction causing immediate rash, facial/tongue/throat swelling, SOB or lightheadedness with hypotension: no Has patient had a PCN reaction causing severe rash involving mucus membranes or skin necrosis: no Has patient had a PCN reaction that required hospitalization : no Has patient had a PCN reaction occurring within the last 10 years: no -Hallucinates If all of the above answers are "NO", then may proceed with Cephalosporin use.   Tessalon Perles Other (See Comments)   Gi upset   Zocor [simvastatin] Other (See Comments)   Upset stomach        Medication List      TAKE these medications    acetaminophen 325 MG tablet Commonly known as: TYLENOL Take 2 tablets (650 mg total) by mouth every 6 (six) hours as needed for mild pain (or Fever >/= 101). What changed:  how much to take reasons to take this   acetic acid 2 % otic solution Place 4 drops into both ears 3 (three) times daily.   allopurinol 100 MG tablet Commonly known as: ZYLOPRIM Take 1 tablet (100 mg total) by mouth daily. What changed: how much to take   amLODipine 5 MG tablet Commonly known as: NORVASC Take 1 tablet (5 mg total) by mouth daily.   brimonidine 0.2 % ophthalmic solution Commonly known as: ALPHAGAN Place 1 drop into the left eye 2 (two) times daily as needed (Dry eyes).   cholecalciferol 25 MCG (1000 UNIT) tablet Commonly known as: VITAMIN D3 Take 1,000 Units by mouth daily.   clonazePAM 1 MG tablet Commonly known as: KLONOPIN TAKE HALF TABLET BY MOUTH EVERY MORNING AND TAKE ONE TABLET BY MOUTH AT BEDTIME   diclofenac Sodium 1 %  Gel Commonly known as: VOLTAREN Apply 2 g topically 3 (three) times daily as needed (for joint pain).   dicyclomine 10 MG capsule Commonly known as: BENTYL Take 1 capsule (10 mg total) by mouth 2 (two) times daily.   fluticasone 50 MCG/ACT nasal spray Commonly known as: FLONASE Place 1-2 sprays into both nostrils daily. What changed:  when to take this reasons to take this   furosemide 40 MG tablet Commonly known as: LASIX Take 0.5 tablets (20 mg total) by mouth daily. What changed: how much to take   glipiZIDE 10 MG tablet Commonly known as: GLUCOTROL Take 20 mg by mouth 2 (two) times daily before a meal.   hydrocortisone 25 MG suppository Commonly known as: ANUSOL-HC Place 1 suppository (25 mg total) rectally 2 (two) times daily.   levothyroxine 75 MCG tablet Commonly known as: SYNTHROID Take 1 tablet (75 mcg total) by mouth daily before breakfast.   metoprolol tartrate 100 MG tablet Commonly known as:  LOPRESSOR Take 1 tablet (100 mg total) by mouth 2 (two) times daily.   ondansetron 4 MG tablet Commonly known as: ZOFRAN Take 1 tablet (4 mg total) by mouth every 6 (six) hours as needed for nausea.   pantoprazole 40 MG tablet Commonly known as: PROTONIX Take 1 tablet (40 mg total) by mouth daily.   polyethylene glycol 17 g packet Commonly known as: MIRALAX / GLYCOLAX Take 17 g by mouth daily as needed for mild constipation.   vitamin E 45 MG (100 UNITS) capsule Take 100 Units by mouth daily.        Follow-up Information     Sherri Holms, NP Follow up in 1 week(s).   Specialty: Nurse Practitioner Contact information: [place] [address] [id number]         Thornton Park, MD Follow up in 1 month(s).   Specialty: Gastroenterology Why: Hospital follow up Contact information: [place] [address] [phone]                Allergies  Allergen Reactions   Ace Inhibitors Swelling    See 02/16/19    Codeine Other (See Comments)    hallucinations   Januvia [Sitagliptin] Diarrhea   Metformin And Related     Upset stomach   Ciprocin-Fluocin-Procin [Fluocinolone Acetonide] Other (See Comments)    Unknown reaction   Clonidine Derivatives Other (See Comments)    Dry mouth   Crestor [Rosuvastatin Calcium] Other (See Comments)    cramps   Penicillins Other (See Comments)    Has patient had a PCN reaction causing immediate rash, facial/tongue/throat swelling, SOB or lightheadedness with hypotension: no Has patient had a PCN reaction causing severe rash involving mucus membranes or skin necrosis: no Has patient had a PCN reaction that required hospitalization : no Has patient had a PCN reaction occurring within the last 10 years: no -Hallucinates If all of the above answers are "NO", then may proceed with Cephalosporin use.    Tessalon Perles Other (See Comments)    Gi upset    Zocor [Simvastatin] Other (See Comments)     Upset stomach       Consultations: GI   Other Procedures/Studies: CT ABDOMEN PELVIS WO CONTRAST  Result Date: 07/01/2021 CLINICAL DATA:  Rectal bleeding x2.  Diverticulitis suspected EXAM: CT ABDOMEN AND PELVIS WITHOUT CONTRAST TECHNIQUE: Multidetector CT imaging of the abdomen and pelvis was performed following the standard protocol without IV contrast. COMPARISON:  03/28/2021 FINDINGS: Lower chest:  No contributory  findings. Hepatobiliary: No focal liver abnormality.Cholecystectomy. No bile duct dilatation. Pancreas: Unremarkable. Spleen: Unremarkable. Adrenals/Urinary Tract: Negative adrenals. Right nephrectomy no hydronephrosis or stone. Unremarkable bladder. Stomach/Bowel: No obstruction. No appendicitis. Uncomplicated duodenal diverticulum. Sigmoid diverticulosis. Vascular/Lymphatic: No acute vascular abnormality. No mass or adenopathy. Reproductive:No pathologic findings. Other: No ascites or pneumoperitoneum.  Fatty left inguinal hernia. Musculoskeletal: No acute abnormalities. Prominent spinal degeneration with L4-5 and L5-S1 anterolisthesis. IMPRESSION: 1. No acute finding or change from May 2022. 2. Sigmoid diverticulosis. 3. Fatty left inguinal hernia. Electronically Signed   By: Monte Fantasia M.D.   On: 07/01/2021 07:32   DG Chest 2 View  Result Date: 07/07/2021 CLINICAL DATA:  Cough, chills EXAM: CHEST - 2 VIEW COMPARISON:  06/20/2021 FINDINGS: Heart and mediastinal contours are within normal limits. No focal opacities or effusions. No acute bony abnormality. IMPRESSION: No active cardiopulmonary disease. Electronically Signed   By: Rolm Baptise M.D.   On: 07/07/2021 00:36   DG Chest 2 View  Result Date: 06/20/2021 CLINICAL DATA:  Chest pain. EXAM: CHEST - 2 VIEW COMPARISON:  03/28/2021. FINDINGS: Mediastinum hilar structures normal. Heart size normal. No focal infiltrate. No pleural effusion or pneumothorax. Degenerative change thoracic spine. No acute bony abnormality. Surgical  clips upper abdomen. IMPRESSION: 1.  No acute cardiopulmonary disease. 2.  No acute bony abnormality.  No pneumothorax. Electronically Signed   By: Marcello Moores  Register   On: 06/20/2021 08:46   DG Shoulder Right  Result Date: 06/15/2021 CLINICAL DATA:  Right shoulder pain after fall. Fall 3 days ago with axillary pain. EXAM: RIGHT SHOULDER - 2+ VIEW COMPARISON:  None. FINDINGS: There is no evidence of fracture or dislocation. Mild acromioclavicular and glenohumeral spurring. Soft tissues are unremarkable. EKG lead overlies the greater tuberosity on the AP view. IMPRESSION: No fracture or dislocation of the right shoulder. Mild osteoarthritis. Electronically Signed   By: Keith Rake M.D.   On: 06/15/2021 22:55   DG Abdomen 1 View  Result Date: 06/20/2021 CLINICAL DATA:  85 year old female with small bowel obstruction EXAM: ABDOMEN - 1 VIEW COMPARISON:  Abdominal CT 03/28/2021, 05/31/2020, plain film 04/11/2019 FINDINGS: Gas within stomach small bowel and colon.  No abnormal distention. No air-fluid levels. Formed stool within the left colon and rectum. Surgical changes in the upper abdomen and mid right abdomen. Vascular calcifications. No unexpected radiopaque foreign body. No unexpected soft tissue density or calcification. Pelvic phleboliths. No displaced fracture IMPRESSION: No evidence of bowel obstruction. Electronically Signed   By: Corrie Mckusick D.O.   On: 06/20/2021 11:01   CT Head Wo Contrast  Result Date: 06/15/2021 CLINICAL DATA:  Fall EXAM: CT HEAD WITHOUT CONTRAST CT CERVICAL SPINE WITHOUT CONTRAST TECHNIQUE: Multidetector CT imaging of the head and cervical spine was performed following the standard protocol without intravenous contrast. Multiplanar CT image reconstructions of the cervical spine were also generated. COMPARISON:  None. FINDINGS: CT HEAD FINDINGS Brain: There is no mass, hemorrhage or extra-axial collection. The size and configuration of the ventricles and extra-axial CSF  spaces are normal. The brain parenchyma is normal, without evidence of acute or chronic infarction. Vascular: No abnormal hyperdensity of the major intracranial arteries or dural venous sinuses. No intracranial atherosclerosis. Skull: The visualized skull base, calvarium and extracranial soft tissues are normal. Sinuses/Orbits: No fluid levels or advanced mucosal thickening of the visualized paranasal sinuses. No mastoid or middle ear effusion. The orbits are normal. CT CERVICAL SPINE FINDINGS Alignment: Grade 1 retrolisthesis at C5-6. Facets are aligned. Occipital condyles are normally positioned. Skull  base and vertebrae: No acute fracture. Soft tissues and spinal canal: No prevertebral fluid or swelling. No visible canal hematoma. Disc levels: No advanced spinal canal or neural foraminal stenosis. Upper chest: No pneumothorax, pulmonary nodule or pleural effusion. Other: Normal visualized paraspinal cervical soft tissues. IMPRESSION: 1. No acute intracranial abnormality. 2. No acute fracture or static subluxation of the cervical spine. Electronically Signed   By: Ulyses Jarred M.D.   On: 06/15/2021 23:26   CT Cervical Spine Wo Contrast  Result Date: 06/15/2021 CLINICAL DATA:  Fall EXAM: CT HEAD WITHOUT CONTRAST CT CERVICAL SPINE WITHOUT CONTRAST TECHNIQUE: Multidetector CT imaging of the head and cervical spine was performed following the standard protocol without intravenous contrast. Multiplanar CT image reconstructions of the cervical spine were also generated. COMPARISON:  None. FINDINGS: CT HEAD FINDINGS Brain: There is no mass, hemorrhage or extra-axial collection. The size and configuration of the ventricles and extra-axial CSF spaces are normal. The brain parenchyma is normal, without evidence of acute or chronic infarction. Vascular: No abnormal hyperdensity of the major intracranial arteries or dural venous sinuses. No intracranial atherosclerosis. Skull: The visualized skull base, calvarium and  extracranial soft tissues are normal. Sinuses/Orbits: No fluid levels or advanced mucosal thickening of the visualized paranasal sinuses. No mastoid or middle ear effusion. The orbits are normal. CT CERVICAL SPINE FINDINGS Alignment: Grade 1 retrolisthesis at C5-6. Facets are aligned. Occipital condyles are normally positioned. Skull base and vertebrae: No acute fracture. Soft tissues and spinal canal: No prevertebral fluid or swelling. No visible canal hematoma. Disc levels: No advanced spinal canal or neural foraminal stenosis. Upper chest: No pneumothorax, pulmonary nodule or pleural effusion. Other: Normal visualized paraspinal cervical soft tissues. IMPRESSION: 1. No acute intracranial abnormality. 2. No acute fracture or static subluxation of the cervical spine. Electronically Signed   By: Ulyses Jarred M.D.   On: 06/15/2021 23:26   DG CHEST PORT 1 VIEW  Result Date: 07/13/2021 CLINICAL DATA:  COVID 19. EXAM: PORTABLE CHEST 1 VIEW COMPARISON:  July 07, 2021 FINDINGS: The heart size and mediastinal contours are within normal limits. Both lungs are clear. The visualized skeletal structures are unremarkable. IMPRESSION: No acute cardiopulmonary disease. Electronically Signed   By: Virgina Norfolk M.D.   On: 07/13/2021 20:55     TODAY-DAY OF DISCHARGE:  Subjective:   Sherri Burns today has no headache,no chest abdominal pain,no new weakness tingling or numbness, feels much better wants to go home today.   Objective:   Blood pressure (!) 156/81, pulse 72, temperature 97.8 F (36.6 C), temperature source Oral, resp. rate 15, height 5' 5.98" (1.676 m), weight 77.5 kg, SpO2 94 %. No intake or output data in the 24 hours ending 07/15/21 0854 Filed Weights   07/14/21 0807 07/15/21 0427  Weight: 74.9 kg 77.5 kg    Exam: Awake Alert, Oriented *3, No new F.N deficits, Normal affect [place].AT,PERRAL Supple Neck,No JVD, No cervical lymphadenopathy appriciated.  Symmetrical Chest wall movement,  Good air movement bilaterally, CTAB RRR,No Gallops,Rubs or new Murmurs, No Parasternal Heave +ve B.Sounds, Abd Soft, Non tender, No organomegaly appriciated, No rebound -guarding or rigidity. No Cyanosis, Clubbing or edema, No new Rash or bruise   PERTINENT RADIOLOGIC STUDIES: DG CHEST PORT 1 VIEW  Result Date: 07/13/2021 CLINICAL DATA:  COVID 19. EXAM: PORTABLE CHEST 1 VIEW COMPARISON:  July 07, 2021 FINDINGS: The heart size and mediastinal contours are within normal limits. Both lungs are clear. The visualized skeletal structures are unremarkable. IMPRESSION: No acute cardiopulmonary disease. Electronically  Signed   By: Virgina Norfolk M.D.   On: 07/13/2021 20:55     PERTINENT LAB RESULTS: CBC: Recent Labs    07/14/21 0837 07/15/21 0111  WBC 6.2 6.6  HGB 13.2 12.4  HCT 39.1 37.5  PLT 202 209   CMET CMP     Component Value Date/Time   NA 140 07/13/2021 1207   NA 140 10/28/2020 1446   K 4.1 07/13/2021 1207   CL 103 07/13/2021 1207   CO2 27 07/13/2021 1207   GLUCOSE 243 (H) 07/13/2021 1207   BUN 8 07/13/2021 1207   BUN 14 10/28/2020 1446   CREATININE 1.20 (H) 07/13/2021 1207   CREATININE 1.45 (H) 10/02/2016 1157   CALCIUM 9.3 07/13/2021 1207   PROT 7.0 07/13/2021 1207   PROT 8.0 09/26/2020 1639   ALBUMIN 3.9 07/13/2021 1207   ALBUMIN 4.9 (H) 09/26/2020 1639   AST 25 07/13/2021 1207   ALT 16 07/13/2021 1207   ALKPHOS 48 07/13/2021 1207   BILITOT 0.8 07/13/2021 1207   BILITOT 0.3 09/26/2020 1639   GFRNONAA 45 (L) 07/13/2021 1207   GFRNONAA 35 (L) 10/02/2016 1157   GFRAA 50 (L) 10/28/2020 1446   GFRAA 40 (L) 10/02/2016 1157    GFR Estimated Creatinine Clearance: 38 mL/min (A) (by C-G formula based on SCr of 1.2 mg/dL (H)). Recent Labs    07/13/21 1207  LIPASE 37   No results for input(s): CKTOTAL, CKMB, CKMBINDEX, TROPONINI in the last 72 hours. Invalid input(s): POCBNP No results for input(s): DDIMER in the last 72 hours. No results for input(s): HGBA1C  in the last 72 hours. No results for input(s): CHOL, HDL, LDLCALC, TRIG, CHOLHDL, LDLDIRECT in the last 72 hours. No results for input(s): TSH, T4TOTAL, T3FREE, THYROIDAB in the last 72 hours.  Invalid input(s): FREET3 No results for input(s): VITAMINB12, FOLATE, FERRITIN, TIBC, IRON, RETICCTPCT in the last 72 hours. Coags: No results for input(s): INR in the last 72 hours.  Invalid input(s): PT Microbiology: Recent Results (from the past 240 hour(s))  Resp Panel by RT-PCR (Flu A&B, Covid) Nasopharyngeal Swab     Status: Abnormal   Collection Time: 07/07/21  1:14 AM   Specimen: Nasopharyngeal Swab; Nasopharyngeal(NP) swabs in vial transport medium  Result Value Ref Range Status   SARS Coronavirus 2 by RT PCR POSITIVE (A) NEGATIVE Final    Comment: RESULT CALLED TO, READ BACK BY AND VERIFIED WITH: C,STRAUGHAN '@0213'$  07/07/21 EB (NOTE) SARS-CoV-2 target nucleic acids are DETECTED.  The SARS-CoV-2 RNA is generally detectable in upper respiratory specimens during the acute phase of infection. Positive results are indicative of the presence of the identified virus, but do not rule out bacterial infection or co-infection with other pathogens not detected by the test. Clinical correlation with patient history and other diagnostic information is necessary to determine patient infection status. The expected result is Negative.  Fact Sheet for Patients: EntrepreneurPulse.com.au  Fact Sheet for Healthcare Providers: IncredibleEmployment.be  This test is not yet approved or cleared by the [place] FDA and  has been authorized for detection and/or diagnosis of SARS-CoV-2 by FDA under an Emergency Use Authorization (EUA).  This EUA will remain in effect (meaning this test can be used ) for the duration of  the COVID-19 declaration under Section 564(b)(1) of the Act, 21 U.S.C. section 360bbb-3(b)(1), unless the authorization is terminated or revoked  sooner.     Influenza A by PCR NEGATIVE NEGATIVE Final   Influenza B by PCR NEGATIVE NEGATIVE Final  Comment: (NOTE) The Xpert Xpress SARS-CoV-2/FLU/RSV plus assay is intended as an aid in the diagnosis of influenza from Nasopharyngeal swab specimens and should not be used as a sole basis for treatment. Nasal washings and aspirates are unacceptable for Xpert Xpress SARS-CoV-2/FLU/RSV testing.  Fact Sheet for Patients: EntrepreneurPulse.com.au  Fact Sheet for Healthcare Providers: IncredibleEmployment.be  This test is not yet approved or cleared by the [place] FDA and has been authorized for detection and/or diagnosis of SARS-CoV-2 by FDA under an Emergency Use Authorization (EUA). This EUA will remain in effect (meaning this test can be used) for the duration of the COVID-19 declaration under Section 564(b)(1) of the Act, 21 U.S.C. section 360bbb-3(b)(1), unless the authorization is terminated or revoked.  Performed at [facility], [place] [address] [place], [place] [address]     FURTHER DISCHARGE INSTRUCTIONS:  Get Medicines reviewed and adjusted: Please take all your medications with you for your next visit with your Primary MD  Laboratory/radiological data: Please request your Primary MD to go over all hospital tests and procedure/radiological results at the follow up, please ask your Primary MD to get all Hospital records sent to his/her office.  In some cases, they will be blood work, cultures and biopsy results pending at the time of your discharge. Please request that your primary care M.D. goes through all the records of your hospital data and follows up on these results.  Also Note the following: If you experience worsening of your admission symptoms, develop shortness of breath, life threatening emergency, suicidal or homicidal thoughts you must seek medical attention immediately by calling 911 or calling your MD  immediately  if symptoms less severe.  You must read complete instructions/literature along with all the possible adverse reactions/side effects for all the Medicines you take and that have been prescribed to you. Take any new Medicines after you have completely understood and accpet all the possible adverse reactions/side effects.   Do not drive when taking Pain medications or sleeping medications (Benzodaizepines)  Do not take more than prescribed Pain, Sleep and Anxiety Medications. It is not advisable to combine anxiety,sleep and pain medications without talking with your primary care practitioner  Special Instructions: If you have smoked or chewed Tobacco  in the last 2 yrs please stop smoking, stop any regular Alcohol  and or any Recreational drug use.  Wear Seat belts while driving.  Please note: You were cared for by a hospitalist during your hospital stay. Once you are discharged, your primary care physician will handle any further medical issues. Please note that NO REFILLS for any discharge medications will be authorized once you are discharged, as it is imperative that you return to your primary care physician (or establish a relationship with a primary care physician if you do not have one) for your post hospital discharge needs so that they can reassess your need for medications and monitor your lab values.  Total Time spent coordinating discharge including counseling, education and face to face time equals 35 minutes.  SignedOren Binet 07/15/2021 8:54 AM

## 2021-07-14 NOTE — ED Notes (Signed)
Attempted to give report to floor

## 2021-07-15 DIAGNOSIS — K922 Gastrointestinal hemorrhage, unspecified: Secondary | ICD-10-CM | POA: Diagnosis not present

## 2021-07-15 LAB — CBC
HCT: 37.5 % (ref 36.0–46.0)
Hemoglobin: 12.4 g/dL (ref 12.0–15.0)
MCH: 32 pg (ref 26.0–34.0)
MCHC: 33.1 g/dL (ref 30.0–36.0)
MCV: 96.6 fL (ref 80.0–100.0)
Platelets: 209 10*3/uL (ref 150–400)
RBC: 3.88 MIL/uL (ref 3.87–5.11)
RDW: 14.1 % (ref 11.5–15.5)
WBC: 6.6 10*3/uL (ref 4.0–10.5)
nRBC: 0 % (ref 0.0–0.2)

## 2021-07-15 LAB — GLUCOSE, CAPILLARY
Glucose-Capillary: 161 mg/dL — ABNORMAL HIGH (ref 70–99)
Glucose-Capillary: 132 mg/dL — ABNORMAL HIGH (ref 70–99)

## 2021-07-15 NOTE — Plan of Care (Signed)
Pt understands signs and symptoms of GI bleed

## 2021-07-15 NOTE — Progress Notes (Signed)
No further hematochezia Brown stools yesterday Hemoglobin stable Plan for discharge-see discharge summary for details.

## 2021-07-15 NOTE — Progress Notes (Addendum)
          Daily Rounding Note  07/15/2021, 11:01 AM  LOS: 0 days   SUBJECTIVE:   Chief complaint: Hemorrhoidal bleeding.  No further rectal bleeding since passed small amount w wipiing yest AM.  Large, brown, liquid stool yest afternoon after Miralax "made a mess". Minor rectal discomfort now.  Ready to go home  OBJECTIVE:         Vital signs in last 24 hours:    Temp:  [97.4 F (36.3 C)-98.7 F (37.1 C)] 97.8 F (36.6 C) (08/20 0813) Pulse Rate:  [60-79] 72 (08/20 0813) Resp:  [13-19] 15 (08/20 0813) BP: (116-167)/(63-89) 156/81 (08/20 0813) SpO2:  [94 %-97 %] 94 % (08/20 0813) Weight:  [77.5 kg] 77.5 kg (08/20 0427) Last BM Date: 07/14/21 Filed Weights   07/14/21 0807 07/15/21 0427  Weight: 74.9 kg 77.5 kg   General: Looks well.  Comfortable.  NAD Heart: RRR. Chest: Clear bilaterally without labored breathing or cough Abdomen: Nontender, nondistended.  Active bowel sounds Extremities: No CCE. Neuro/Psych: Appropriate.  Oriented x3.  Fluent speech.  No gross deficits.  Intake/Output from previous day: No intake/output data recorded.  Intake/Output this shift: No intake/output data recorded.  Lab Results: Recent Labs    07/13/21 1207 07/14/21 0837 07/15/21 0111  WBC 5.6 6.2 6.6  HGB 13.4 13.2 12.4  HCT 41.1 39.1 37.5  PLT 214 202 209   BMET Recent Labs    07/13/21 1207  NA 140  K 4.1  CL 103  CO2 27  GLUCOSE 243*  BUN 8  CREATININE 1.20*  CALCIUM 9.3   LFT Recent Labs    07/13/21 1207  PROT 7.0  ALBUMIN 3.9  AST 25  ALT 16  ALKPHOS 48  BILITOT 0.8   PT/INR No results for input(s): LABPROT, INR in the last 72 hours. Hepatitis Panel No results for input(s): HEPBSAG, HCVAB, HEPAIGM, HEPBIGM in the last 72 hours.  Studies/Results: DG CHEST PORT 1 VIEW  Result Date: 07/13/2021 CLINICAL DATA:  COVID 19. EXAM: PORTABLE CHEST 1 VIEW COMPARISON:  July 07, 2021 FINDINGS: The heart size and  mediastinal contours are within normal limits. Both lungs are clear. The visualized skeletal structures are unremarkable. IMPRESSION: No acute cardiopulmonary disease. Electronically Signed   By: Virgina Norfolk M.D.   On: 07/13/2021 20:55    ASSESMENT:      Lower GI bleeding, painless hematochezia.  Probable hemorrhoidal bleeding diverticular bleeding possible..  Patient with remote history hemorrhoidal banding.  Bleeding accelerated in recent days with baseline of intermittent minor BPR.     No anemia.  Hgb 13.4 >> 12.4 over the last 48 hours.     AKI, improved.   PLAN      Okay for discharge home today.  Please provide prescription Anusol HC suppositories bid prn.  I advised patient that if this prescription is too expensive, she can substitute with OTC cortisone cream, use a generous tap above this inserted into the rectum.  After yesterday's vigorous response to MiraLAX, patient may be leery of using this but would suggest at least stool softeners daily, psyllium daily and as needed MiraLAX to keep her stool soft     Will arrange follow-up with GI.  She may need to see Dr. Carlean Purl or one of the GI physicians to office-based hemorrhoid interventions.  Will message Dr. Tarri Glenn to have patient set up with the proper provider.    Azucena Freed  07/15/2021, 11:01 AM Phone [phone]

## 2021-07-20 DIAGNOSIS — K648 Other hemorrhoids: Secondary | ICD-10-CM | POA: Insufficient documentation

## 2021-08-22 ENCOUNTER — Ambulatory Visit: Payer: Medicare Other | Admitting: Gastroenterology

## 2021-09-08 ENCOUNTER — Other Ambulatory Visit: Payer: Self-pay | Admitting: Gastroenterology

## 2021-10-23 ENCOUNTER — Other Ambulatory Visit: Payer: Self-pay | Admitting: Registered Nurse

## 2021-10-23 DIAGNOSIS — R443 Hallucinations, unspecified: Secondary | ICD-10-CM

## 2022-01-31 ENCOUNTER — Other Ambulatory Visit: Payer: Self-pay | Admitting: Gastroenterology

## 2022-02-15 ENCOUNTER — Other Ambulatory Visit: Payer: Self-pay | Admitting: Gastroenterology

## 2022-05-26 ENCOUNTER — Encounter (HOSPITAL_COMMUNITY): Payer: Self-pay | Admitting: Emergency Medicine

## 2022-05-26 ENCOUNTER — Other Ambulatory Visit: Payer: Self-pay

## 2022-05-26 ENCOUNTER — Emergency Department (HOSPITAL_COMMUNITY)
Admission: EM | Admit: 2022-05-26 | Discharge: 2022-05-26 | Disposition: A | Discharge status: Home / Self Care | Payer: Medicare Other | Attending: Emergency Medicine | Admitting: Emergency Medicine

## 2022-05-26 ENCOUNTER — Emergency Department (HOSPITAL_COMMUNITY): Payer: Medicare Other

## 2022-05-26 DIAGNOSIS — R072 Precordial pain: Secondary | ICD-10-CM | POA: Diagnosis present

## 2022-05-26 LAB — TROPONIN I (HIGH SENSITIVITY)
Troponin I (High Sensitivity): 6 ng/L (ref <18)
Troponin I (High Sensitivity): 7 ng/L (ref <18)

## 2022-05-26 LAB — I-STAT CHEM 8, ED
BUN: 23 mg/dL (ref 8–23)
Calcium, Ion: 1.03 mmol/L — ABNORMAL LOW (ref 1.15–1.40)
Chloride: 104 mmol/L (ref 98–111)
Creatinine, Ser: 1.5 mg/dL — ABNORMAL HIGH (ref 0.44–1.00)
Glucose, Bld: 149 mg/dL — ABNORMAL HIGH (ref 70–99)
HCT: 44 % (ref 36.0–46.0)
Hemoglobin: 15 g/dL (ref 12.0–15.0)
Potassium: 3.9 mmol/L (ref 3.5–5.1)
Sodium: 138 mmol/L (ref 135–145)
TCO2: 27 mmol/L (ref 22–32)

## 2022-05-26 LAB — CBC
HCT: 45.1 % (ref 36.0–46.0)
Hemoglobin: 15.2 g/dL — ABNORMAL HIGH (ref 12.0–15.0)
MCH: 33.3 pg (ref 26.0–34.0)
MCHC: 33.7 g/dL (ref 30.0–36.0)
MCV: 98.7 fL (ref 80.0–100.0)
Platelets: 158 10*3/uL (ref 150–400)
RBC: 4.57 MIL/uL (ref 3.87–5.11)
RDW: 14.2 % (ref 11.5–15.5)
WBC: 6.1 10*3/uL (ref 4.0–10.5)
nRBC: 0 % (ref 0.0–0.2)

## 2022-05-26 LAB — BASIC METABOLIC PANEL
Anion gap: 14 (ref 5–15)
BUN: 22 mg/dL (ref 8–23)
CO2: 21 mmol/L — ABNORMAL LOW (ref 22–32)
Calcium: 9.6 mg/dL (ref 8.9–10.3)
Chloride: 101 mmol/L (ref 98–111)
Creatinine, Ser: 1.44 mg/dL — ABNORMAL HIGH (ref 0.44–1.00)
GFR, Estimated: 36 mL/min — ABNORMAL LOW (ref >60)
Glucose, Bld: 153 mg/dL — ABNORMAL HIGH (ref 70–99)
Potassium: 5.4 mmol/L — ABNORMAL HIGH (ref 3.5–5.1)
Sodium: 136 mmol/L (ref 135–145)

## 2022-05-26 MED ORDER — ALUM & MAG HYDROXIDE-SIMETH 200-200-20 MG/5ML PO SUSP
30.0000 mL | Freq: Once | ORAL | Status: AC
  Administered 2022-05-26: 30 mL via ORAL
  Filled 2022-05-26: qty 30

## 2022-05-26 MED ORDER — OMEPRAZOLE 20 MG PO CPDR: 20.0000 mg | DELAYED_RELEASE_CAPSULE | Freq: Every day | ORAL | 0 refills | Status: DC

## 2022-05-26 NOTE — ED Triage Notes (Signed)
Patient with sudden onset of chest pain 2 nights ago.  No shortness of breath, no nausea or vomiting, no radiation of pain.  Patient has never had this before.  Chest pain has not resolved at this time.  Pain is 8/10.

## 2022-05-26 NOTE — ED Provider Notes (Signed)
[place] EM[facility] DEPARTMENT Provider Note   CSN: [id number] Arrival date & time: 05/26/22  0009     History  Chief Complaint  Patient presents with   Chest Pain    Sherri Burns is a 86 y.o. female.  The history is provided by the patient.  Chest Pain Pain location:  Substernal area Pain quality: dull   Pain radiates to:  Does not radiate Pain severity:  Moderate Onset quality:  Gradual Duration:  2 days Timing:  Constant Progression:  Unchanged Chronicity:  New Context: at rest   Relieved by:  Nothing Worsened by:  Nothing Ineffective treatments:  None tried Associated symptoms: no abdominal pain, no AICD problem, no altered mental status, no anorexia, no anxiety, no back pain, no claudication, no cough, no diaphoresis, no fatigue, no fever, no headache, no heartburn, no lower extremity edema, no nausea, no numbness, no orthopnea, no palpitations, no PND, no shortness of breath, no syncope, no vomiting and no weakness   Risk factors: no aortic disease        Home Medications Prior to Admission medications   Medication Sig Start Date End Date Taking? Authorizing Provider  acetaminophen (TYLENOL) 325 MG tablet Take 2 tablets (650 mg total) by mouth every 6 (six) hours as needed for mild pain (or Fever >/= 101). Patient taking differently: Take 325 mg by mouth every 6 (six) hours as needed for mild pain. 06/02/20   Allie Bossier, MD  acetic acid 2 % otic solution Place 4 drops into both ears 3 (three) times daily. Patient not taking: Reported on 07/13/2021 09/17/19   Jacelyn Pi, Lilia Argue, MD  allopurinol (ZYLOPRIM) 100 MG tablet Take 1 tablet (100 mg total) by mouth daily. Patient taking differently: Take 50 mg by mouth daily. 11/11/20   Trula Slade, DPM  amLODipine (NORVASC) 5 MG tablet Take 1 tablet (5 mg total) by mouth daily. Patient not taking: Reported on 07/13/2021 12/06/20   Just, Laurita Quint, FNP  brimonidine (ALPHAGAN) 0.2 %  ophthalmic solution Place 1 drop into the left eye 2 (two) times daily as needed (Dry eyes). 10/27/19   [provider]  cholecalciferol (VITAMIN D3) 25 MCG (1000 UNIT) tablet Take 1,000 Units by mouth daily.    [provider]  clonazePAM (KLONOPIN) 1 MG tablet TAKE HALF TABLET BY MOUTH EVERY MORNING AND TAKE ONE TABLET BY MOUTH AT BEDTIME Patient not taking: No sig reported 12/14/20   Just, Laurita Quint, FNP  diclofenac Sodium (VOLTAREN) 1 % GEL Apply 2 g topically 3 (three) times daily as needed (for joint pain). Patient not taking: Reported on 07/13/2021 09/13/20   Nuala Alpha A, PA-C  dicyclomine (BENTYL) 10 MG capsule TAKE ONE CAPSULE BY MOUTH TWICE A DAY 01/31/22   Zehr, Janett Billow D, PA-C  fluticasone (FLONASE) 50 MCG/ACT nasal spray Place 1-2 sprays into both nostrils daily. Patient taking differently: Place 1-2 sprays into both nostrils daily as needed for allergies. 06/17/20   Daleen Squibb, MD  furosemide (LASIX) 40 MG tablet Take 0.5 tablets (20 mg total) by mouth daily. Patient taking differently: Take 40 mg by mouth daily. 08/08/20   Jacelyn Pi, Lilia Argue, MD  glipiZIDE (GLUCOTROL) 10 MG tablet Take 20 mg by mouth 2 (two) times daily before a meal.    [provider]  hydrocortisone (ANUSOL-HC) 25 MG suppository Place 1 suppository (25 mg total) rectally 2 (two) times daily. 07/14/21   Ghimire, Henreitta Leber, MD  levothyroxine (SYNTHROID)  75 MCG tablet Take 1 tablet (75 mcg total) by mouth daily before breakfast. 08/18/20   Jacelyn Pi, Lilia Argue, MD  metoprolol tartrate (LOPRESSOR) 100 MG tablet Take 1 tablet (100 mg total) by mouth 2 (two) times daily. 06/16/20   Daleen Squibb, MD  ondansetron (ZOFRAN) 4 MG tablet Take 1 tablet (4 mg total) by mouth every 6 (six) hours as needed for nausea. 06/02/20   Allie Bossier, MD  pantoprazole (PROTONIX) 40 MG tablet TAKE ONE TABLET BY MOUTH DAILY 02/15/22   Zehr, Janett Billow D, PA-C  polyethylene glycol (MIRALAX /  GLYCOLAX) 17 g packet Take 17 g by mouth daily as needed for mild constipation. 06/02/20   Allie Bossier, MD  vitamin E 100 UNIT capsule Take 100 Units by mouth daily.    [provider]      Allergies    Ace inhibitors, Codeine, Januvia [sitagliptin], Metformin and related, Ciprocin-fluocin-procin [fluocinolone acetonide], Clonidine derivatives, Crestor [rosuvastatin calcium], Penicillins, Tessalon perles, and Zocor [simvastatin]    Review of Systems   Review of Systems  Constitutional:  Negative for diaphoresis, fatigue and fever.  HENT:  Negative for facial swelling.   Eyes:  Negative for redness.  Respiratory:  Negative for cough and shortness of breath.   Cardiovascular:  Positive for chest pain. Negative for palpitations, orthopnea, claudication, syncope and PND.  Gastrointestinal:  Negative for abdominal pain, anorexia, heartburn, nausea and vomiting.  Genitourinary:  Negative for dysuria.  Musculoskeletal:  Negative for back pain.  Neurological:  Negative for weakness, numbness and headaches.  Psychiatric/Behavioral:  Negative for agitation.   All other systems reviewed and are negative.   Physical Exam Updated Vital Signs BP (!) 147/81 (BP Location: Right Arm)   Pulse 79   Temp 98.9 F (37.2 C) (Oral)   Resp 13   SpO2 99%  Physical Exam Vitals and nursing note reviewed.  Constitutional:      General: She is not in acute distress.    Appearance: Normal appearance. She is well-developed. She is not diaphoretic.  HENT:     Head: Normocephalic and atraumatic.     Nose: Nose normal.  Eyes:     Extraocular Movements: Extraocular movements intact.     Pupils: Pupils are equal, round, and reactive to light.  Cardiovascular:     Rate and Rhythm: Normal rate and regular rhythm.     Pulses: Normal pulses.     Heart sounds: Normal heart sounds.  Pulmonary:     Effort: Pulmonary effort is normal. No respiratory distress.     Breath sounds: Normal breath sounds.   Abdominal:     General: Abdomen is flat. Bowel sounds are normal. There is no distension.     Palpations: Abdomen is soft.     Tenderness: There is no abdominal tenderness. There is no guarding or rebound.  Genitourinary:    Vagina: No vaginal discharge.  Musculoskeletal:        General: Normal range of motion.     Cervical back: Neck supple.  Skin:    General: Skin is warm and dry.     Capillary Refill: Capillary refill takes less than 2 seconds.     Findings: No erythema or rash.  Neurological:     General: No focal deficit present.     Mental Status: She is alert and oriented to person, place, and time.     Deep Tendon Reflexes: Reflexes normal.  Psychiatric:        Mood and  Affect: Mood normal.        Behavior: Behavior normal.     ED Results / Procedures / Treatments   Labs (all labs ordered are listed, but only abnormal results are displayed) Results for orders placed or performed during the hospital encounter of 12/75/17  Basic metabolic panel  Result Value Ref Range   Sodium 136 135 - 145 mmol/L   Potassium 5.4 (H) 3.5 - 5.1 mmol/L   Chloride 101 98 - 111 mmol/L   CO2 21 (L) 22 - 32 mmol/L   Glucose, Bld 153 (H) 70 - 99 mg/dL   BUN 22 8 - 23 mg/dL   Creatinine, Ser 1.44 (H) 0.44 - 1.00 mg/dL   Calcium 9.6 8.9 - 10.3 mg/dL   GFR, Estimated 36 (L) >60 mL/min   Anion gap 14 5 - 15  CBC  Result Value Ref Range   WBC 6.1 4.0 - 10.5 K/uL   RBC 4.57 3.87 - 5.11 MIL/uL   Hemoglobin 15.2 (H) 12.0 - 15.0 g/dL   HCT 45.1 36.0 - 46.0 %   MCV 98.7 80.0 - 100.0 fL   MCH 33.3 26.0 - 34.0 pg   MCHC 33.7 30.0 - 36.0 g/dL   RDW 14.2 11.5 - 15.5 %   Platelets 158 150 - 400 K/uL   nRBC 0.0 0.0 - 0.2 %  I-stat chem 8, ED (not at [facility] or [facility])  Result Value Ref Range   Sodium 138 135 - 145 mmol/L   Potassium 3.9 3.5 - 5.1 mmol/L   Chloride 104 98 - 111 mmol/L   BUN 23 8 - 23 mg/dL   Creatinine, Ser 1.50 (H) 0.44 - 1.00 mg/dL   Glucose, Bld 149 (H) 70 - 99 mg/dL    Calcium, Ion 1.03 (L) 1.15 - 1.40 mmol/L   TCO2 27 22 - 32 mmol/L   Hemoglobin 15.0 12.0 - 15.0 g/dL   HCT 44.0 36.0 - 46.0 %  Troponin I (High Sensitivity)  Result Value Ref Range   Troponin I (High Sensitivity) 6 <18 ng/L  Troponin I (High Sensitivity)  Result Value Ref Range   Troponin I (High Sensitivity) 7 <18 ng/L   DG Chest 2 View  Result Date: 05/26/2022 CLINICAL DATA:  Chest, abdomen and neck pain x3 days. EXAM: CHEST - 2 VIEW COMPARISON:  July 13, 2021 FINDINGS: The heart size and mediastinal contours are within normal limits. There is tortuosity of the descending thoracic aorta. Low lung volumes are noted. There is no evidence of acute infiltrate, pleural effusion or pneumothorax. Radiopaque surgical clips are seen within the right upper quadrant. Degenerative changes are seen throughout the thoracic spine. IMPRESSION: Low lung volumes without active cardiopulmonary disease. Electronically Signed   By: Virgina Norfolk M.D.   On: 05/26/2022 01:10     EKG  EKG Interpretation  Date/Time:  Saturday May 26 2022 00:22:56 EDT Ventricular Rate:  81 PR Interval:  174 QRS Duration: 106 QT Interval:  370 QTC Calculation: 429 R Axis:   0 Text Interpretation: Normal sinus rhythm Minimal voltage criteria for LVH, may be normal variant ( Cornell product ) ST & T abnormality no change Confirmed by Randal Buba, Chanese Hartsough ([id number]) on 05/26/2022 6:32:19 AM          Radiology DG Chest 2 View  Result Date: 05/26/2022 CLINICAL DATA:  Chest, abdomen and neck pain x3 days. EXAM: CHEST - 2 VIEW COMPARISON:  July 13, 2021 FINDINGS: The heart size and mediastinal contours are within normal limits. There  is tortuosity of the descending thoracic aorta. Low lung volumes are noted. There is no evidence of acute infiltrate, pleural effusion or pneumothorax. Radiopaque surgical clips are seen within the right upper quadrant. Degenerative changes are seen throughout the thoracic spine. IMPRESSION: Low lung  volumes without active cardiopulmonary disease. Electronically Signed   By: Virgina Norfolk M.D.   On: 05/26/2022 01:10    Procedures Procedures    Medications Ordered in ED Medications  alum & mag hydroxide-simeth (MAALOX/MYLANTA) 200-200-20 MG/5ML suspension 30 mL (30 mLs Oral Given 05/26/22 0427)    ED Course/ Medical Decision Making/ A&P                           Medical Decision Making 2 days of continuous chest pain.  No DOE.  No SOB.  No leg pain no travel.  No n/v/d.    Amount and/or Complexity of Data Reviewed External Data Reviewed: notes.    Details: previous notes reviewed Labs: ordered.    Details: all labs reviewed: normal white count 6.1, 15.2 hemoglobin normal platelets.  normal sodium, first potassium was hemolyzed checked and is normal at 3.9.  elevated creatinine 1.5.  2 negative troponins 6/7 Radiology: ordered and independent interpretation performed.    Details: normal cxr ECG/medicine tests: ordered and independent interpretation performed.  Risk OTC drugs. Prescription drug management. Risk Details: Ruled out for MI with 2 days of pain, EKG unchanged.  This is likely GERD and I will start a PPI and have patient follow up with their PMD.  Strict return precautions given.      Final Clinical Impression(s) / ED Diagnoses Final diagnoses:  None   Return for intractable cough, coughing up blood, fevers > 100.4 unrelieved by medication, shortness of breath, intractable vomiting, chest pain, shortness of breath, weakness, numbness, changes in speech, facial asymmetry, abdominal pain, passing out, Inability to tolerate liquids or food, cough, altered mental status or any concerns. No signs of systemic illness or infection. The patient is nontoxic-appearing on exam and vital signs are within normal limits.  I have reviewed the triage vital signs and the nursing notes. Pertinent labs & imaging results that were available during my care of the patient were reviewed  by me and considered in my medical decision making (see chart for details). After history, exam, and medical workup I feel the patient has been appropriately medically screened and is safe for discharge home. Pertinent diagnoses were discussed with the patient. Patient was given return precautions.  Rx / DC Orders ED Discharge Orders     None         Perrion Diesel, MD 05/26/22 [phone]

## 2022-05-30 ENCOUNTER — Other Ambulatory Visit (HOSPITAL_COMMUNITY): Payer: Self-pay | Admitting: Registered Nurse

## 2022-05-30 DIAGNOSIS — R519 Headache, unspecified: Secondary | ICD-10-CM

## 2022-05-30 DIAGNOSIS — R42 Dizziness and giddiness: Secondary | ICD-10-CM

## 2022-05-30 DIAGNOSIS — R4701 Aphasia: Secondary | ICD-10-CM

## 2022-06-15 ENCOUNTER — Ambulatory Visit (HOSPITAL_COMMUNITY)
Admission: RE | Admit: 2022-06-15 | Discharge: 2022-06-15 | Disposition: A | Discharge status: Home / Self Care | Payer: Medicare Other | Source: Ambulatory Visit | Attending: Registered Nurse | Admitting: Registered Nurse

## 2022-06-15 DIAGNOSIS — R42 Dizziness and giddiness: Secondary | ICD-10-CM | POA: Diagnosis not present

## 2022-06-15 DIAGNOSIS — R4701 Aphasia: Secondary | ICD-10-CM | POA: Insufficient documentation

## 2022-06-15 DIAGNOSIS — R519 Headache, unspecified: Secondary | ICD-10-CM | POA: Insufficient documentation

## 2022-06-15 MED ORDER — GADOBUTROL 1 MMOL/ML IV SOLN
7.5000 mL | Freq: Once | INTRAVENOUS | Status: AC | PRN
Start: 2022-06-15 — End: 2022-06-15
  Administered 2022-06-15: 7.5 mL via INTRAVENOUS

## 2022-06-18 ENCOUNTER — Emergency Department (HOSPITAL_COMMUNITY)
Admission: EM | Admit: 2022-06-18 | Discharge: 2022-06-19 | Disposition: A | Discharge status: Home / Self Care | Payer: Medicare Other | Attending: Emergency Medicine | Admitting: Emergency Medicine

## 2022-06-18 ENCOUNTER — Other Ambulatory Visit: Payer: Self-pay

## 2022-06-18 DIAGNOSIS — F4321 Adjustment disorder with depressed mood: Secondary | ICD-10-CM | POA: Diagnosis not present

## 2022-06-18 DIAGNOSIS — K644 Residual hemorrhoidal skin tags: Secondary | ICD-10-CM | POA: Diagnosis not present

## 2022-06-18 DIAGNOSIS — Z85528 Personal history of other malignant neoplasm of kidney: Secondary | ICD-10-CM | POA: Insufficient documentation

## 2022-06-18 DIAGNOSIS — R059 Cough, unspecified: Secondary | ICD-10-CM | POA: Insufficient documentation

## 2022-06-18 DIAGNOSIS — N189 Chronic kidney disease, unspecified: Secondary | ICD-10-CM | POA: Insufficient documentation

## 2022-06-18 DIAGNOSIS — K5900 Constipation, unspecified: Secondary | ICD-10-CM | POA: Diagnosis not present

## 2022-06-18 DIAGNOSIS — I509 Heart failure, unspecified: Secondary | ICD-10-CM | POA: Insufficient documentation

## 2022-06-18 DIAGNOSIS — E119 Type 2 diabetes mellitus without complications: Secondary | ICD-10-CM | POA: Diagnosis not present

## 2022-06-18 DIAGNOSIS — R1084 Generalized abdominal pain: Secondary | ICD-10-CM | POA: Insufficient documentation

## 2022-06-18 DIAGNOSIS — R4583 Excessive crying of child, adolescent or adult: Secondary | ICD-10-CM | POA: Insufficient documentation

## 2022-06-18 DIAGNOSIS — Z7984 Long term (current) use of oral hypoglycemic drugs: Secondary | ICD-10-CM | POA: Diagnosis not present

## 2022-06-18 DIAGNOSIS — I130 Hypertensive heart and chronic kidney disease with heart failure and stage 1 through stage 4 chronic kidney disease, or unspecified chronic kidney disease: Secondary | ICD-10-CM | POA: Insufficient documentation

## 2022-06-18 DIAGNOSIS — R7401 Elevation of levels of liver transaminase levels: Secondary | ICD-10-CM | POA: Diagnosis not present

## 2022-06-18 DIAGNOSIS — R4589 Other symptoms and signs involving emotional state: Secondary | ICD-10-CM

## 2022-06-18 DIAGNOSIS — R300 Dysuria: Secondary | ICD-10-CM | POA: Diagnosis not present

## 2022-06-18 DIAGNOSIS — R112 Nausea with vomiting, unspecified: Secondary | ICD-10-CM | POA: Diagnosis not present

## 2022-06-18 DIAGNOSIS — Z79899 Other long term (current) drug therapy: Secondary | ICD-10-CM | POA: Insufficient documentation

## 2022-06-18 DIAGNOSIS — R1011 Right upper quadrant pain: Secondary | ICD-10-CM | POA: Diagnosis present

## 2022-06-18 DIAGNOSIS — R7989 Other specified abnormal findings of blood chemistry: Secondary | ICD-10-CM

## 2022-06-18 LAB — COMPREHENSIVE METABOLIC PANEL
ALT: 73 U/L — ABNORMAL HIGH (ref 0–44)
AST: 235 U/L — ABNORMAL HIGH (ref 15–41)
Albumin: 4.3 g/dL (ref 3.5–5.0)
Alkaline Phosphatase: 94 U/L (ref 38–126)
Anion gap: 8 (ref 5–15)
BUN: 15 mg/dL (ref 8–23)
CO2: 28 mmol/L (ref 22–32)
Calcium: 9.7 mg/dL (ref 8.9–10.3)
Chloride: 102 mmol/L (ref 98–111)
Creatinine, Ser: 1.28 mg/dL — ABNORMAL HIGH (ref 0.44–1.00)
GFR, Estimated: 42 mL/min — ABNORMAL LOW (ref >60)
Glucose, Bld: 152 mg/dL — ABNORMAL HIGH (ref 70–99)
Potassium: 5 mmol/L (ref 3.5–5.1)
Sodium: 138 mmol/L (ref 135–145)
Total Bilirubin: 0.7 mg/dL (ref 0.3–1.2)
Total Protein: 7.4 g/dL (ref 6.5–8.1)

## 2022-06-18 LAB — CBC WITH DIFFERENTIAL/PLATELET
Abs Immature Granulocytes: 0.04 10*3/uL (ref 0.00–0.07)
Basophils Absolute: 0.1 10*3/uL (ref 0.0–0.1)
Basophils Relative: 1 %
Eosinophils Absolute: 0.2 10*3/uL (ref 0.0–0.5)
Eosinophils Relative: 3 %
HCT: 40.4 % (ref 36.0–46.0)
Hemoglobin: 13.6 g/dL (ref 12.0–15.0)
Immature Granulocytes: 1 %
Lymphocytes Relative: 40 %
Lymphs Abs: 2.4 10*3/uL (ref 0.7–4.0)
MCH: 32.9 pg (ref 26.0–34.0)
MCHC: 33.7 g/dL (ref 30.0–36.0)
MCV: 97.6 fL (ref 80.0–100.0)
Monocytes Absolute: 0.6 10*3/uL (ref 0.1–1.0)
Monocytes Relative: 9 %
Neutro Abs: 2.9 10*3/uL (ref 1.7–7.7)
Neutrophils Relative %: 46 %
Platelets: 204 10*3/uL (ref 150–400)
RBC: 4.14 MIL/uL (ref 3.87–5.11)
RDW: 14.2 % (ref 11.5–15.5)
WBC: 6.1 10*3/uL (ref 4.0–10.5)
nRBC: 0 % (ref 0.0–0.2)

## 2022-06-18 LAB — URINALYSIS, ROUTINE W REFLEX MICROSCOPIC
Bilirubin Urine: NEGATIVE
Glucose, UA: NEGATIVE mg/dL
Hgb urine dipstick: NEGATIVE
Ketones, ur: NEGATIVE mg/dL
Leukocytes,Ua: NEGATIVE
Nitrite: NEGATIVE
Protein, ur: NEGATIVE mg/dL
Specific Gravity, Urine: 1.005 (ref 1.005–1.030)
pH: 7 (ref 5.0–8.0)

## 2022-06-18 LAB — LIPASE, BLOOD: Lipase: 43 U/L (ref 11–51)

## 2022-06-18 NOTE — ED Triage Notes (Signed)
Pt here from home via [facility] for diffuse stabbing 7/10 abd pain since Saturday. Hx cholecystectomy and nephrectomy. Pt reports constipation but did have small BM today after taking miralax. Pt denies N/V/D 158/90, 84HR, 99% RA, cbg 150

## 2022-06-18 NOTE — ED Provider Triage Note (Signed)
Emergency Medicine Provider Triage Evaluation Note  Sherri Burns , a 86 y.o. female  was evaluated in triage.  Pt complains of abdominal pain since Saturday with distention worse in the right upper quadrant sharp.  Status post cholecystectomy.  History of kidney cancer status post nephrectomy also has been constipated had a small bowel movement today after taking 2 cups of MiraLAX.  Review of Systems  Positive: Abdominal pain Negative: diarrhea  Physical Exam  There were no vitals taken for this visit. Gen:   Awake, no distress   Resp:  Normal effort  MSK:   Moves extremities without difficulty  Other:  Tenderness to palpation  Medical Decision Making  Medically screening exam initiated at 8:12 PM.  Appropriate orders placed.  [name] was informed that the remainder of the evaluation will be completed by another provider, this initial triage assessment does not replace that evaluation, and the importance of remaining in the ED until their evaluation is complete.  Work-up initiated   Margarita Mail, PA-C 06/18/22 2023

## 2022-06-19 ENCOUNTER — Emergency Department (HOSPITAL_COMMUNITY): Payer: Medicare Other

## 2022-06-19 DIAGNOSIS — F4321 Adjustment disorder with depressed mood: Secondary | ICD-10-CM

## 2022-06-19 LAB — COMPREHENSIVE METABOLIC PANEL
ALT: 54 U/L — ABNORMAL HIGH (ref 0–44)
AST: 56 U/L — ABNORMAL HIGH (ref 15–41)
Albumin: 3.5 g/dL (ref 3.5–5.0)
Alkaline Phosphatase: 79 U/L (ref 38–126)
Anion gap: 7 (ref 5–15)
BUN: 13 mg/dL (ref 8–23)
CO2: 27 mmol/L (ref 22–32)
Calcium: 8.9 mg/dL (ref 8.9–10.3)
Chloride: 107 mmol/L (ref 98–111)
Creatinine, Ser: 1.18 mg/dL — ABNORMAL HIGH (ref 0.44–1.00)
GFR, Estimated: 46 mL/min — ABNORMAL LOW (ref >60)
Glucose, Bld: 132 mg/dL — ABNORMAL HIGH (ref 70–99)
Potassium: 4.3 mmol/L (ref 3.5–5.1)
Sodium: 141 mmol/L (ref 135–145)
Total Bilirubin: 0.7 mg/dL (ref 0.3–1.2)
Total Protein: 6.3 g/dL — ABNORMAL LOW (ref 6.5–8.1)

## 2022-06-19 LAB — AMMONIA: Ammonia: 20 umol/L (ref 9–35)

## 2022-06-19 LAB — PROTIME-INR
INR: 1 (ref 0.8–1.2)
Prothrombin Time: 12.9 seconds (ref 11.4–15.2)

## 2022-06-19 MED ORDER — ONDANSETRON HCL 4 MG/2ML IJ SOLN
4.0000 mg | Freq: Once | INTRAMUSCULAR | Status: AC
  Administered 2022-06-19: 4 mg via INTRAVENOUS
  Filled 2022-06-19: qty 2

## 2022-06-19 MED ORDER — HYDROCORTISONE (PERIANAL) 2.5 % EX CREA
TOPICAL_CREAM | Freq: Once | CUTANEOUS | Status: AC
Start: 2022-06-19 — End: 2022-06-19
  Administered 2022-06-19: 1 via RECTAL
  Filled 2022-06-19: qty 28.35

## 2022-06-19 MED ORDER — SODIUM CHLORIDE 0.9 % IV BOLUS
1000.0000 mL | Freq: Once | INTRAVENOUS | Status: AC
  Administered 2022-06-19: 1000 mL via INTRAVENOUS

## 2022-06-19 MED ORDER — IOHEXOL 300 MG/ML  SOLN
100.0000 mL | Freq: Once | INTRAMUSCULAR | Status: AC | PRN
  Administered 2022-06-19: 80 mL via INTRAVENOUS

## 2022-06-19 MED ORDER — FENTANYL CITRATE PF 50 MCG/ML IJ SOSY
50.0000 ug | PREFILLED_SYRINGE | Freq: Once | INTRAMUSCULAR | Status: AC
  Administered 2022-06-19: 50 ug via INTRAVENOUS
  Filled 2022-06-19: qty 1

## 2022-06-19 MED ORDER — HYDROCORTISONE (PERIANAL) 2.5 % EX CREA: 1.0000 | TOPICAL_CREAM | Freq: Two times a day (BID) | CUTANEOUS | 0 refills | Status: DC
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
## 2022-06-19 NOTE — Discharge Instructions (Addendum)
Your history, exam and work-up today revealed no acute obstruction but I do suspect some of your hemorrhoidal troubles are making more challenging for you to have a bowel movement thus leading to stool backing up and having pain.  Your liver function was elevated briefly but that improved after some fluids.  Your work-up was otherwise reassuring as we discussed.  During our discussion, you are having the tearfulness prompting [place] to call the mental health team to evaluate you.  Please follow-up on their recommendations.  Please rest and stay hydrated.  Please use the Anusol to help with the hemorrhoids and please consider following up with general surgery for further management of this.  If any symptoms change or worsen acutely, please turn to the nearest emergency department.   Please talk to your current outpatient Doctor about increasing your Quetiapine from '25mg'$  po to '50mg'$  po at bed time.

## 2022-06-19 NOTE — ED Provider Notes (Signed)
Signout from Dr. Gustavus Messing.  86 year old female here with abdominal pain.  Clinically has tender hemorrhoids and signs of constipation.  Also endorsing increased depressive symptoms.  She is pending a TTS consult.  Disposition per TTS recommendations.  If they clear her she can be discharged with symptomatic treatment for her hemorrhoids and outpatient surgery follow-up. Physical Exam  BP (!) 156/86   Pulse 91   Temp 97.7 F (36.5 C) (Oral)   Resp 16   SpO2 100%   Physical Exam  Procedures  Procedures  ED Course / MDM    Medical Decision Making Amount and/or Complexity of Data Reviewed Labs: ordered. Radiology: ordered.  Risk Prescription drug management.   Patient was evaluated by psychiatry and was felt appropriate for outpatient follow-up.  They have given her contact information.       Sherri Rasmussen, MD 06/20/22 [id number]

## 2022-06-19 NOTE — ED Notes (Signed)
Pt verbalizes understanding of discharge instructions. Opportunity for questions and answers were provided. Pt discharged from the ED.   ?

## 2022-06-19 NOTE — ED Provider Notes (Signed)
[facility] EMERGENCY DEPARTMENT Provider Note   CSN: [id number] Arrival date & time: 06/18/22  2012     History  Chief Complaint  Patient presents with   Abdominal Pain    Sherri Burns is a 86 y.o. female.  The history is provided by the patient and medical records. No language interpreter was used.  Abdominal Pain Pain location:  RUQ Pain quality: aching, cramping and sharp   Pain radiates to:  LUQ, LLQ, RUQ, RLQ and epigastric region Pain severity:  Severe Onset quality:  Gradual Duration:  4 days Timing:  Constant Progression:  Waxing and waning Chronicity:  New Context: not trauma   Relieved by:  Nothing Worsened by:  Nothing Ineffective treatments:  None tried Associated symptoms: constipation, cough, dysuria, fatigue, nausea and vomiting   Associated symptoms: no chest pain, no chills, no diarrhea, no fever and no shortness of breath   Risk factors: multiple surgeries   Risk factors: no alcohol abuse, no NSAID use and not obese        Home Medications Prior to Admission medications   Medication Sig Start Date End Date Taking? Authorizing Provider  acetaminophen (TYLENOL) 325 MG tablet Take 2 tablets (650 mg total) by mouth every 6 (six) hours as needed for mild pain (or Fever >/= 101). Patient taking differently: Take 325 mg by mouth every 6 (six) hours as needed for mild pain. 06/02/20   Allie Bossier, MD  acetic acid 2 % otic solution Place 4 drops into both ears 3 (three) times daily. Patient not taking: Reported on 07/13/2021 09/17/19   Jacelyn Pi, Lilia Argue, MD  allopurinol (ZYLOPRIM) 100 MG tablet Take 1 tablet (100 mg total) by mouth daily. Patient taking differently: Take 50 mg by mouth daily. 11/11/20   Trula Slade, DPM  amLODipine (NORVASC) 5 MG tablet Take 1 tablet (5 mg total) by mouth daily. Patient not taking: Reported on 07/13/2021 12/06/20   Just, Laurita Quint, FNP  brimonidine (ALPHAGAN) 0.2 % ophthalmic solution Place  1 drop into the left eye 2 (two) times daily as needed (Dry eyes). 10/27/19   [provider]  cholecalciferol (VITAMIN D3) 25 MCG (1000 UNIT) tablet Take 1,000 Units by mouth daily.    [provider]  clonazePAM (KLONOPIN) 1 MG tablet TAKE HALF TABLET BY MOUTH EVERY MORNING AND TAKE ONE TABLET BY MOUTH AT BEDTIME Patient not taking: No sig reported 12/14/20   Just, Laurita Quint, FNP  diclofenac Sodium (VOLTAREN) 1 % GEL Apply 2 g topically 3 (three) times daily as needed (for joint pain). Patient not taking: Reported on 07/13/2021 09/13/20   Nuala Alpha A, PA-C  dicyclomine (BENTYL) 10 MG capsule TAKE ONE CAPSULE BY MOUTH TWICE A DAY 01/31/22   Zehr, Janett Billow D, PA-C  fluticasone (FLONASE) 50 MCG/ACT nasal spray Place 1-2 sprays into both nostrils daily. Patient taking differently: Place 1-2 sprays into both nostrils daily as needed for allergies. 06/17/20   Daleen Squibb, MD  furosemide (LASIX) 40 MG tablet Take 0.5 tablets (20 mg total) by mouth daily. Patient taking differently: Take 40 mg by mouth daily. 08/08/20   Jacelyn Pi, Lilia Argue, MD  glipiZIDE (GLUCOTROL) 10 MG tablet Take 20 mg by mouth 2 (two) times daily before a meal.    [provider]  hydrocortisone (ANUSOL-HC) 25 MG suppository Place 1 suppository (25 mg total) rectally 2 (two) times daily. 07/14/21   Ghimire, Henreitta Leber, MD  levothyroxine (SYNTHROID) 75 MCG tablet  Take 1 tablet (75 mcg total) by mouth daily before breakfast. 08/18/20   Jacelyn Pi, Lilia Argue, MD  metoprolol tartrate (LOPRESSOR) 100 MG tablet Take 1 tablet (100 mg total) by mouth 2 (two) times daily. 06/16/20   Jacelyn Pi, Lilia Argue, MD  omeprazole (PRILOSEC) 20 MG capsule Take 1 capsule (20 mg total) by mouth daily. 05/26/22   Palumbo, April, MD  ondansetron (ZOFRAN) 4 MG tablet Take 1 tablet (4 mg total) by mouth every 6 (six) hours as needed for nausea. 06/02/20   Allie Bossier, MD  pantoprazole (PROTONIX) 40 MG tablet TAKE ONE TABLET BY  MOUTH DAILY 02/15/22   Zehr, Janett Billow D, PA-C  polyethylene glycol (MIRALAX / GLYCOLAX) 17 g packet Take 17 g by mouth daily as needed for mild constipation. 06/02/20   Allie Bossier, MD  vitamin E 100 UNIT capsule Take 100 Units by mouth daily.    [provider]      Allergies    Ace inhibitors, Codeine, Januvia [sitagliptin], Metformin and related, Ciprocin-fluocin-procin [fluocinolone acetonide], Clonidine derivatives, Crestor [rosuvastatin calcium], Penicillins, Tessalon perles, and Zocor [simvastatin]    Review of Systems   Review of Systems  Constitutional:  Positive for fatigue. Negative for chills, diaphoresis and fever.  HENT:  Negative for congestion.   Respiratory:  Positive for cough. Negative for chest tightness, shortness of breath and wheezing.   Cardiovascular:  Negative for chest pain, palpitations and leg swelling.  Gastrointestinal:  Positive for abdominal pain, constipation, nausea and vomiting. Negative for diarrhea.  Genitourinary:  Positive for dysuria. Negative for flank pain and frequency.  Musculoskeletal:  Positive for back pain (chronic reported). Negative for neck pain.  Skin:  Positive for rash (on palms for months). Negative for wound.  Neurological:  Negative for dizziness, weakness, light-headedness and headaches.  Psychiatric/Behavioral:  Negative for agitation and confusion.   All other systems reviewed and are negative.   Physical Exam Updated Vital Signs BP (!) 155/92 (BP Location: Left Arm)   Pulse 70   Temp 98.7 F (37.1 C) (Oral)   Resp 18   SpO2 98%  Physical Exam Vitals and nursing note reviewed.  Constitutional:      General: She is not in acute distress.    Appearance: She is well-developed. She is not ill-appearing, toxic-appearing or diaphoretic.  HENT:     Head: Normocephalic and atraumatic.  Eyes:     Conjunctiva/sclera: Conjunctivae normal.  Cardiovascular:     Rate and Rhythm: Normal rate and regular rhythm.     Heart  sounds: No murmur heard. Pulmonary:     Effort: Pulmonary effort is normal. No respiratory distress.     Breath sounds: Normal breath sounds. No wheezing, rhonchi or rales.  Chest:     Chest wall: No tenderness.  Abdominal:     General: Abdomen is flat. Bowel sounds are normal. There is no distension.     Palpations: Abdomen is soft.     Tenderness: There is abdominal tenderness in the right upper quadrant, epigastric area and periumbilical area. There is no right CVA tenderness or left CVA tenderness.  Musculoskeletal:        General: No swelling.     Cervical back: Neck supple.  Skin:    General: Skin is warm and dry.     Capillary Refill: Capillary refill takes less than 2 seconds.  Neurological:     General: No focal deficit present.     Mental Status: She is alert.  Psychiatric:  Mood and Affect: Mood normal.     ED Results / Procedures / Treatments   Labs (all labs ordered are listed, but only abnormal results are displayed) Labs Reviewed  COMPREHENSIVE METABOLIC PANEL - Abnormal; Notable for the following components:      Result Value   Glucose, Bld 152 (*)    Creatinine, Ser 1.28 (*)    AST 235 (*)    ALT 73 (*)    GFR, Estimated 42 (*)    All other components within normal limits  URINALYSIS, ROUTINE W REFLEX MICROSCOPIC - Abnormal; Notable for the following components:   Color, Urine COLORLESS (*)    All other components within normal limits  COMPREHENSIVE METABOLIC PANEL - Abnormal; Notable for the following components:   Glucose, Bld 132 (*)    Creatinine, Ser 1.18 (*)    Total Protein 6.3 (*)    AST 56 (*)    ALT 54 (*)    GFR, Estimated 46 (*)    All other components within normal limits  CBC WITH DIFFERENTIAL/PLATELET  LIPASE, BLOOD  PROTIME-INR  AMMONIA    EKG None  Radiology DG Chest Portable 1 View  Result Date: 06/19/2022 CLINICAL DATA:  Chest and abdominal pain. EXAM: PORTABLE CHEST 1 VIEW COMPARISON:  Chest x-ray and chest CT  05/26/2022 FINDINGS: The cardiac silhouette, mediastinal and hilar contours are within normal limits stable. Stable tortuosity, ectasia and calcification of the thoracic aorta. Streaky bibasilar atelectasis but no infiltrates, effusions or pneumothorax. IMPRESSION: Streaky bibasilar atelectasis.  No infiltrates or effusions. Electronically Signed   By: Marijo Sanes M.D.   On: 06/19/2022 08:23   CT Abdomen Pelvis W Contrast  Result Date: 06/19/2022 CLINICAL DATA:  86 year old female with abdominal pain for 3 days. EXAM: CT ABDOMEN AND PELVIS WITH CONTRAST TECHNIQUE: Multidetector CT imaging of the abdomen and pelvis was performed using the standard protocol following bolus administration of intravenous contrast. RADIATION DOSE REDUCTION: This exam was performed according to the departmental dose-optimization program which includes automated exposure control, adjustment of the mA and/or kV according to patient size and/or use of iterative reconstruction technique. CONTRAST:  46m OMNIPAQUE IOHEXOL 300 MG/ML  SOLN COMPARISON:  CT Abdomen and Pelvis 07/01/2021 and earlier. FINDINGS: Lower chest: Cardiac size remains within normal limits. No pericardial or pleural effusion. Mild respiratory motion and scarring at the lung bases. Hepatobiliary: Absent gallbladder.  Liver is stable and negative. Pancreas: Stable partial fatty atrophy.  Otherwise negative. Spleen: Stable and negative. Adrenals/Urinary Tract: Chronic right nephrectomy. Right adrenal gland might also be surgically absent. Left adrenal and left kidney appears stable since 2021 and negative. Negative left ureter and bladder. No urinary calculus identified. Stomach/Bowel: Chronic sigmoid diverticulosis. Cecum chronically located in the pelvis, and the appendix is mostly in the left hemipelvis as seen on coronal image 78. Appendix appears stable since last year. No active inflammation identified. Diverticulosis of the terminal ileum with no active  inflammation (coronal image 63). No dilated small bowel. Chronic postoperative changes to the ventral abdominal wall are stable with no adverse features. Decompressed stomach. Chronic 3.5 cm duodenum diverticulum near the midline with no active inflammation on series 3, image 34. No free air or free fluid. Vascular/Lymphatic: Aortoiliac calcified atherosclerosis. Mild arterial tortuosity. Major arterial structures remain patent. Portal venous system is patent. No lymphadenopathy identified. Reproductive: Surgically absent uterus. Diminutive or absent ovaries. Small chronic fat containing left inguinal hernia is stable since 2021. Other: No pelvic free fluid.  Incidental pelvic phleboliths. Musculoskeletal: Advanced lumbar  spine disc and facet degeneration. Chronic spondylolisthesis L3-L4 through L5-S1. Flowing endplate osteophytes in the lower thoracic spine with multilevel associated interbody ankylosis. No acute or suspicious osseous lesion identified. IMPRESSION: 1. No acute or inflammatory process identified in the abdomen or pelvis. 2. Appendix chronically located in the left hemipelvis, stable and within normal limits. Chronic diverticulosis of the duodenum, terminal ileum, and sigmoid colon without active inflammation. 3. Chronic right nephrectomy, cholecystectomy, hysterectomy. 4. Aortic Atherosclerosis (ICD10-I70.0). Electronically Signed   By: Genevie Ann M.D.   On: 06/19/2022 07:21    Procedures Procedures    Medications Ordered in ED Medications  hydrocortisone (ANUSOL-HC) 2.5 % rectal cream (has no administration in time range)  iohexol (OMNIPAQUE) 300 MG/ML solution 100 mL (80 mLs Intravenous Contrast Given 06/19/22 0706)  sodium chloride 0.9 % bolus 1,000 mL (0 mLs Intravenous Stopped 06/19/22 1109)  fentaNYL (SUBLIMAZE) injection 50 mcg (50 mcg Intravenous Given 06/19/22 0834)  ondansetron (ZOFRAN) injection 4 mg (4 mg Intravenous Given 06/19/22 8250)    ED Course/ Medical Decision Making/  A&P                           Medical Decision Making Amount and/or Complexity of Data Reviewed Labs: ordered. Radiology: ordered.  Risk Prescription drug management.    Sherri Burns is a 86 y.o. female with a past medical history significant for hypertension, CKD, diabetes, CHF, previous GI bleeding, previous right nephrectomy from renal cancer, previous cholecystectomy, previous hysterectomy, and ischemic colitis who presents with abdominal pain, nausea, and vomiting.  According to patient, she chronically suffers with multiple troubles including chronic back pain, chronic fatigue, occasional pruritus, and some rashes on her palms but says that starting on Saturday she had onset of new abdominal pain.  She reports the abdominal pain started in her right upper quadrant and then went diffusely.  She describes it as up to a 10 out of 10 in severity and is currently an 8 out of 10.  She has had episodes of nausea and vomiting.  She reports she had decreased bowel movements but today did have several small pellet stool balls harder than normal.  She reports he chronically has constipation troubles and that will sit with MiraLAX and coffee.  She says that she has had some dysuria but denies any hematuria.  She denies diarrhea.  Denies any trauma recently but said last year she fell down some stairs.  She denies any fevers or chills but does report a recent cough.  She does report feeling tired.  On exam, lungs were clear and chest was nontender.  Abdomen was diffusely tender but worse in the right upper quadrant.  CVA area is nontender and I do not appreciate any rash to suggest shingles.  She had intact sensation, strength, and pulses in extremities.  Legs were nontender and nonedematous.  As patient's new problem over the last few days as his right abdominal pain, this will be the focus of our work-up although she does report these many chronic problems going on for months.  We discussed that she  will likely need to follow-up with her PCP for further evaluation management of those troubles however patient was seen approximately 11 hours and 30 minutes ago when she checked into the emergency department.  Patient had some work-up in triage including some labs and a CT scan.  CT scan did not show evidence of diverticulitis, appendicitis, or obstruction.  She does have previous  cholecystectomy with her liver looking stable and no evidence of appendicitis on the CT scan.  There is no evidence of other acute abnormality and we went over all the images altogether.  What is slightly concerning is compared to prior, her liver function is much more elevated and that is where she had the onset of discomfort.  Since she has been here for nearly 12 hours, we will get a repeat metabolic panel to see if her liver function is worsening.  She also appeared dehydrated and dry with her constipation we will give her some fluids as well as some nausea medicine and pain medicine.  Due to the diffuse itching and the liver function worsening we will get other testing on her liver including an INR and an ammonia level.   As we discussed, this could have just been pain related to her harder stools.  If work-up is reassuring, dissipate she is likely stable for discharge home  2:19 PM Work-up continues to return.  Repeat metabolic panel showed nearly normal hepatic function after fluids.  She still having some intermittent discomfort and after reassessment I am concerned this is more due to constipation related to what she is now describing as hemorrhoids.  I did a rectal exam with a nurse chaperone and she does have some tenderness and palpable hemorrhoids.  There is no evidence of acute infection and there was no thrombosed hemorrhoid seen.  No bleeding.  Clinically concerned patient's worsened hemorrhoids are making it difficult for her to have bowel movements as there is no evidence of acute obstruction on CT scan.  I  think the abdominal pain is due to the constipation.  Patient reports she already takes MiraLAX but has not had anything to drink for over 18 hours here.  We will give some food and fluids.  While further discussions were taking place, she started crying saying that she has " had it and cannot take it anymore".  She denies suicidal ideation but says that she has had a son lost to suicide, multiple family members die recently, and she is tearful saying she cannot take it.  I am concerned about what seems to be new depression.  She reports she is going to talk to her psychiatrist that we will put a TTS consult in.  Anticipate discharge with return for Anusol and close general surgery follow-up for these hemorrhoids that are likely causing worsening constipation and abdominal pain although work-up was otherwise reassuring and improving.  Prescription printed for Anusol and amatory referral to general surgery was placed.  Care transferred to oncoming team to await recommendations by TTS.  Anticipate recommendations for follow-up and discharged         Final Clinical Impression(s) / ED Diagnoses Final diagnoses:  Generalized abdominal pain  Constipation, unspecified constipation type  LFT elevation  Nausea and vomiting, unspecified vomiting type  External hemorrhoid  Tearfulness    Rx / DC Orders ED Discharge Orders          Ordered    hydrocortisone (ANUSOL-HC) 2.5 % rectal cream  2 times daily        06/19/22 1500    Ambulatory referral to General Surgery       Comments: Likely colorectal surgeon would be the best due to her symptoms likely being hemorrhoidal in nature.   06/19/22 1502            Clinical Impression: 1. Generalized abdominal pain   2. Constipation, unspecified constipation type   3.  LFT elevation   4. Nausea and vomiting, unspecified vomiting type   5. External hemorrhoid   6. Tearfulness     Disposition: Care transferred to oncoming team to await  reassessment after TTS consultation.  Anticipate discharge home after  This note was prepared with assistance of Dragon voice recognition software. Occasional wrong-word or sound-a-like substitutions may have occurred due to the inherent limitations of voice recognition software.      Deval Mroczka, Gwenyth Allegra, MD 06/19/22 1505

## 2022-10-25 ENCOUNTER — Other Ambulatory Visit: Payer: Self-pay | Admitting: Internal Medicine

## 2022-10-26 LAB — COMPLETE METABOLIC PANEL WITH GFR
AG Ratio: 1.3 (calc) (ref 1.0–2.5)
ALT: 11 U/L (ref 6–29)
AST: 19 U/L (ref 10–35)
Albumin: 4.5 g/dL (ref 3.6–5.1)
Alkaline phosphatase (APISO): 63 U/L (ref 37–153)
BUN/Creatinine Ratio: 15 (calc) (ref 6–22)
BUN: 17 mg/dL (ref 7–25)
CO2: 22 mmol/L (ref 20–32)
Calcium: 9.9 mg/dL (ref 8.6–10.4)
Chloride: 102 mmol/L (ref 98–110)
Creat: 1.16 mg/dL — ABNORMAL HIGH (ref 0.60–0.95)
Globulin: 3.4 g/dL (calc) (ref 1.9–3.7)
Glucose, Bld: 137 mg/dL — ABNORMAL HIGH (ref 65–99)
Potassium: 4.3 mmol/L (ref 3.5–5.3)
Sodium: 142 mmol/L (ref 135–146)
Total Bilirubin: 0.7 mg/dL (ref 0.2–1.2)
Total Protein: 7.9 g/dL (ref 6.1–8.1)
eGFR: 46 mL/min/{1.73_m2} — ABNORMAL LOW (ref >=60)

## 2022-10-26 LAB — CBC
HCT: 39.5 % (ref 35.0–45.0)
Hemoglobin: 13.4 g/dL (ref 11.7–15.5)
MCH: 32.4 pg (ref 27.0–33.0)
MCHC: 33.9 g/dL (ref 32.0–36.0)
MCV: 95.6 fL (ref 80.0–100.0)
MPV: 11.7 fL (ref 7.5–12.5)
Platelets: 204 10*3/uL (ref 140–400)
RBC: 4.13 10*6/uL (ref 3.80–5.10)
RDW: 13.8 % (ref 11.0–15.0)
WBC: 8 10*3/uL (ref 3.8–10.8)

## 2022-10-26 LAB — SPECIMEN COMPROMISED

## 2022-10-26 LAB — LIPID PANEL
Cholesterol: 225 mg/dL — ABNORMAL HIGH (ref <200)
HDL: 51 mg/dL (ref >=50)
LDL Cholesterol (Calc): 134 mg/dL (calc) — ABNORMAL HIGH
Non-HDL Cholesterol (Calc): 174 mg/dL (calc) — ABNORMAL HIGH (ref <130)
Total CHOL/HDL Ratio: 4.4 (calc) (ref <5.0)
Triglycerides: 258 mg/dL — ABNORMAL HIGH (ref <150)

## 2022-10-26 LAB — VITAMIN B12: Vitamin B-12: 224 pg/mL (ref 200–1100)

## 2022-10-26 LAB — T4, FREE: Free T4: 1.3 ng/dL (ref 0.8–1.8)

## 2022-10-26 LAB — URIC ACID: Uric Acid, Serum: 4.9 mg/dL (ref 2.5–7.0)

## 2022-10-26 LAB — TSH: TSH: 1.18 mIU/L (ref 0.40–4.50)

## 2022-10-26 LAB — FOLATE: Folate: 14.2 ng/mL

## 2022-10-26 LAB — VITAMIN D 25 HYDROXY (VIT D DEFICIENCY, FRACTURES): Vit D, 25-Hydroxy: 43 ng/mL (ref 30–100)

## 2022-11-22 ENCOUNTER — Other Ambulatory Visit: Payer: Self-pay

## 2022-11-22 ENCOUNTER — Encounter (HOSPITAL_BASED_OUTPATIENT_CLINIC_OR_DEPARTMENT_OTHER): Payer: Self-pay | Admitting: Emergency Medicine

## 2022-11-22 DIAGNOSIS — Z85528 Personal history of other malignant neoplasm of kidney: Secondary | ICD-10-CM | POA: Insufficient documentation

## 2022-11-22 DIAGNOSIS — E119 Type 2 diabetes mellitus without complications: Secondary | ICD-10-CM | POA: Insufficient documentation

## 2022-11-22 DIAGNOSIS — Z85048 Personal history of other malignant neoplasm of rectum, rectosigmoid junction, and anus: Secondary | ICD-10-CM | POA: Insufficient documentation

## 2022-11-22 DIAGNOSIS — I10 Essential (primary) hypertension: Secondary | ICD-10-CM | POA: Diagnosis not present

## 2022-11-22 DIAGNOSIS — Z7984 Long term (current) use of oral hypoglycemic drugs: Secondary | ICD-10-CM | POA: Insufficient documentation

## 2022-11-22 DIAGNOSIS — E039 Hypothyroidism, unspecified: Secondary | ICD-10-CM | POA: Insufficient documentation

## 2022-11-22 DIAGNOSIS — Z79899 Other long term (current) drug therapy: Secondary | ICD-10-CM | POA: Diagnosis not present

## 2022-11-22 DIAGNOSIS — R1010 Upper abdominal pain, unspecified: Secondary | ICD-10-CM | POA: Diagnosis present

## 2022-11-22 DIAGNOSIS — R109 Unspecified abdominal pain: Secondary | ICD-10-CM | POA: Diagnosis not present

## 2022-11-22 DIAGNOSIS — F17210 Nicotine dependence, cigarettes, uncomplicated: Secondary | ICD-10-CM | POA: Insufficient documentation

## 2022-11-22 DIAGNOSIS — K5909 Other constipation: Secondary | ICD-10-CM | POA: Diagnosis not present

## 2022-11-22 NOTE — ED Triage Notes (Signed)
Pt complaints of constipated and hemorrhoids, pt states stomach is swollen. Drank prune juice 2 days ago and then vomited. Pt states hemorrhoids are large and she needs surgery.

## 2022-11-23 ENCOUNTER — Emergency Department (HOSPITAL_BASED_OUTPATIENT_CLINIC_OR_DEPARTMENT_OTHER): Payer: Medicare Other

## 2022-11-23 ENCOUNTER — Emergency Department (HOSPITAL_BASED_OUTPATIENT_CLINIC_OR_DEPARTMENT_OTHER)
Admission: EM | Admit: 2022-11-23 | Discharge: 2022-11-23 | Disposition: A | Discharge status: Home / Self Care | Payer: Medicare Other | Attending: Emergency Medicine | Admitting: Emergency Medicine

## 2022-11-23 ENCOUNTER — Emergency Department (HOSPITAL_BASED_OUTPATIENT_CLINIC_OR_DEPARTMENT_OTHER)
Admission: EM | Admit: 2022-11-23 | Discharge: 2022-11-23 | Disposition: A | Discharge status: Home / Self Care | Payer: Medicare Other | Source: Home / Self Care | Attending: Emergency Medicine | Admitting: Emergency Medicine

## 2022-11-23 DIAGNOSIS — Z79899 Other long term (current) drug therapy: Secondary | ICD-10-CM | POA: Insufficient documentation

## 2022-11-23 DIAGNOSIS — K5909 Other constipation: Secondary | ICD-10-CM

## 2022-11-23 DIAGNOSIS — K5900 Constipation, unspecified: Secondary | ICD-10-CM | POA: Insufficient documentation

## 2022-11-23 DIAGNOSIS — R109 Unspecified abdominal pain: Secondary | ICD-10-CM | POA: Insufficient documentation

## 2022-11-23 DIAGNOSIS — Z85528 Personal history of other malignant neoplasm of kidney: Secondary | ICD-10-CM | POA: Insufficient documentation

## 2022-11-23 LAB — CBC WITH DIFFERENTIAL/PLATELET
Abs Immature Granulocytes: 0.03 10*3/uL (ref 0.00–0.07)
Basophils Absolute: 0 10*3/uL (ref 0.0–0.1)
Basophils Relative: 1 %
Eosinophils Absolute: 0.1 10*3/uL (ref 0.0–0.5)
Eosinophils Relative: 2 %
HCT: 42.9 % (ref 36.0–46.0)
Hemoglobin: 14 g/dL (ref 12.0–15.0)
Immature Granulocytes: 0 %
Lymphocytes Relative: 42 %
Lymphs Abs: 3.5 10*3/uL (ref 0.7–4.0)
MCH: 31.7 pg (ref 26.0–34.0)
MCHC: 32.6 g/dL (ref 30.0–36.0)
MCV: 97.1 fL (ref 80.0–100.0)
Monocytes Absolute: 0.5 10*3/uL (ref 0.1–1.0)
Monocytes Relative: 6 %
Neutro Abs: 4.1 10*3/uL (ref 1.7–7.7)
Neutrophils Relative %: 49 %
Platelets: 220 10*3/uL (ref 150–400)
RBC: 4.42 MIL/uL (ref 3.87–5.11)
RDW: 13.9 % (ref 11.5–15.5)
WBC: 8.3 10*3/uL (ref 4.0–10.5)
nRBC: 0 % (ref 0.0–0.2)

## 2022-11-23 LAB — URINALYSIS, ROUTINE W REFLEX MICROSCOPIC
Bilirubin Urine: NEGATIVE
Glucose, UA: NEGATIVE mg/dL
Hgb urine dipstick: NEGATIVE
Ketones, ur: NEGATIVE mg/dL
Leukocytes,Ua: NEGATIVE
Nitrite: NEGATIVE
Protein, ur: NEGATIVE mg/dL
Specific Gravity, Urine: 1.015 (ref 1.005–1.030)
pH: 7 (ref 5.0–8.0)

## 2022-11-23 LAB — COMPREHENSIVE METABOLIC PANEL
ALT: 14 U/L (ref 0–44)
AST: 17 U/L (ref 15–41)
Albumin: 5 g/dL (ref 3.5–5.0)
Alkaline Phosphatase: 57 U/L (ref 38–126)
Anion gap: 11 (ref 5–15)
BUN: 18 mg/dL (ref 8–23)
CO2: 28 mmol/L (ref 22–32)
Calcium: 10.6 mg/dL — ABNORMAL HIGH (ref 8.9–10.3)
Chloride: 101 mmol/L (ref 98–111)
Creatinine, Ser: 1.37 mg/dL — ABNORMAL HIGH (ref 0.44–1.00)
GFR, Estimated: 38 mL/min — ABNORMAL LOW (ref >60)
Glucose, Bld: 121 mg/dL — ABNORMAL HIGH (ref 70–99)
Potassium: 3.6 mmol/L (ref 3.5–5.1)
Sodium: 140 mmol/L (ref 135–145)
Total Bilirubin: 0.5 mg/dL (ref 0.3–1.2)
Total Protein: 8.6 g/dL — ABNORMAL HIGH (ref 6.5–8.1)

## 2022-11-23 LAB — LIPASE, BLOOD: Lipase: 37 U/L (ref 11–51)

## 2022-11-23 MED ORDER — IOHEXOL 300 MG/ML  SOLN
100.0000 mL | Freq: Once | INTRAMUSCULAR | Status: AC | PRN
Start: 2022-11-23 — End: 2022-11-23
  Administered 2022-11-23: 60 mL via INTRAVENOUS

## 2022-11-23 MED ORDER — ONDANSETRON 4 MG PO TBDP
4.0000 mg | ORAL_TABLET | Freq: Once | ORAL | Status: AC
  Administered 2022-11-23: 4 mg via ORAL
  Filled 2022-11-23: qty 1

## 2022-11-23 MED ORDER — POLYETHYLENE GLYCOL 3350 17 G PO PACK: 17.0000 g | PACK | Freq: Every day | ORAL | 0 refills | Status: DC

## 2022-11-23 MED ORDER — ONDANSETRON HCL 4 MG/2ML IJ SOLN
4.0000 mg | Freq: Once | INTRAMUSCULAR | Status: AC
  Administered 2022-11-23: 4 mg via INTRAVENOUS
  Filled 2022-11-23: qty 2

## 2022-11-23 MED ORDER — ONDANSETRON HCL 4 MG PO TABS: 4.0000 mg | ORAL_TABLET | Freq: Four times a day (QID) | ORAL | 0 refills | Status: AC

## 2022-11-23 NOTE — Discharge Instructions (Signed)
Seen today for stomach pain and constipation.  He came back because he vomited after drinking the magnesium citrate for the constipation.  We will give you MiraLAX instead.  You are also given Zofran to help with any nausea.  You should follow-up with your primary care doctor as well as gastroenterology.  Come back to the ER for any new or worsening symptoms.

## 2022-11-23 NOTE — ED Triage Notes (Addendum)
Presents via EMS for constipation and lower abd pain. Tried mag citrate but ended up vomiting this up. Has had a small BM this AM but has been 2 days since last normal BM Was seen this AM for same and received a CT. Endorses abd distention EMS VS: 156/85, HR 104, 100%, CBG 241

## 2022-11-23 NOTE — ED Notes (Signed)
Pt given discharge instructions. Opportunities given for questions. Pt verbalizes understanding. Carlesha Seiple R, RN 

## 2022-11-23 NOTE — ED Provider Notes (Signed)
[facility] EMERGENCY DEPT Provider Note   CSN: [id number] Arrival date & time: 11/23/22  1206     History  No chief complaint on file.   Sherri Burns is a 86 y.o. female.  HPI this is an 86 year old female history of constipation, hemorrhoids, renal cell carcinoma.  She came in earlier this morning for abdominal pain and had labs and a CT scan that showed stool in her colon but no acute findings.  She was sent home with mag citrate but states when she got home she drank this and immediately vomited it back up.  No hematemesis, no hematochezia.  At her previous visit the rectal exam and there is no stool in the rectal vault to suggest that disimpaction or enema might help.She states she had a colonoscopy long time ago but her GI doctor had retired     Home Medications Prior to Admission medications   Medication Sig Start Date End Date Taking? Authorizing Provider  ondansetron (ZOFRAN) 4 MG tablet Take 1 tablet (4 mg total) by mouth every 6 (six) hours. 11/23/22  Yes Allice Garro A, PA-C  polyethylene glycol (MIRALAX) 17 g packet Take 17 g by mouth daily. 11/23/22  Yes Terald Jump A, PA-C  acetaminophen (TYLENOL) 325 MG tablet Take 2 tablets (650 mg total) by mouth every 6 (six) hours as needed for mild pain (or Fever >/= 101). Patient taking differently: Take 650 mg by mouth every 6 (six) hours as needed for mild pain or fever. 06/02/20   Allie Bossier, MD  allopurinol (ZYLOPRIM) 100 MG tablet Take 1 tablet (100 mg total) by mouth daily. 11/11/20   Trula Slade, DPM  amLODipine (NORVASC) 5 MG tablet Take 1 tablet (5 mg total) by mouth daily. Patient taking differently: Take 10 mg by mouth daily. 12/06/20   Just, Laurita Quint, FNP  cholecalciferol (VITAMIN D3) 25 MCG (1000 UNIT) tablet Take 1,000 Units by mouth daily.    [provider]  diclofenac Sodium (VOLTAREN) 1 % GEL Apply 2 g topically 3 (three) times daily as needed (for joint pain). Patient  taking differently: Apply 2 g topically 3 (three) times daily as needed (Joint pain). 09/13/20   Nuala Alpha A, PA-C  dicyclomine (BENTYL) 10 MG capsule TAKE ONE CAPSULE BY MOUTH TWICE A DAY Patient taking differently: Take 10 mg by mouth in the morning and at bedtime. 01/31/22   Zehr, Laban Emperor, PA-C  ezetimibe (ZETIA) 10 MG tablet Take 10 mg by mouth daily. 05/25/22   [provider]  fluticasone (FLONASE) 50 MCG/ACT nasal spray Place 1-2 sprays into both nostrils daily. 06/17/20   Daleen Squibb, MD  furosemide (LASIX) 40 MG tablet Take 0.5 tablets (20 mg total) by mouth daily. Patient taking differently: Take 40 mg by mouth daily. 08/08/20   Jacelyn Pi, Lilia Argue, MD  glipiZIDE (GLUCOTROL) 10 MG tablet Take 20 mg by mouth 2 (two) times daily before a meal.    [provider]  hydrocortisone (ANUSOL-HC) 2.5 % rectal cream Place 1 Application rectally 2 (two) times daily. 06/19/22   Tegeler, Gwenyth Allegra, MD  levothyroxine (SYNTHROID) 75 MCG tablet Take 1 tablet (75 mcg total) by mouth daily before breakfast. 08/18/20   Jacelyn Pi, Lilia Argue, MD  metoprolol tartrate (LOPRESSOR) 100 MG tablet Take 1 tablet (100 mg total) by mouth 2 (two) times daily. 06/16/20   Daleen Squibb, MD  omeprazole (PRILOSEC) 20 MG capsule Take 1 capsule (20 mg total) by  mouth daily. 05/26/22   Palumbo, April, MD  pantoprazole (PROTONIX) 40 MG tablet TAKE ONE TABLET BY MOUTH DAILY Patient taking differently: Take 40 mg by mouth daily. 02/15/22   Zehr, Laban Emperor, PA-C  QUEtiapine (SEROQUEL) 25 MG tablet Take 25 mg by mouth at bedtime. 05/25/22   [provider]  vitamin E 100 UNIT capsule Take 100 Units by mouth daily.    [provider]      Allergies    Ace inhibitors, Codeine, Januvia [sitagliptin], Metformin and related, Ciprocin-fluocin-procin [fluocinolone acetonide], Clonidine derivatives, Crestor [rosuvastatin calcium], Penicillins, Tessalon perles, and Zocor  [simvastatin]    Review of Systems   Review of Systems  Gastrointestinal:  Positive for abdominal pain.    Physical Exam Updated Vital Signs BP (!) 146/86   Pulse 85   Temp 98 F (36.7 C) (Oral)   Resp (!) 26   Wt 74 kg   SpO2 97%   BMI 27.15 kg/m  Physical Exam Vitals and nursing note reviewed.  Constitutional:      General: She is not in acute distress.    Appearance: She is well-developed.  HENT:     Head: Normocephalic and atraumatic.  Eyes:     Conjunctiva/sclera: Conjunctivae normal.  Cardiovascular:     Rate and Rhythm: Normal rate and regular rhythm.     Heart sounds: No murmur heard. Pulmonary:     Effort: Pulmonary effort is normal. No respiratory distress.     Breath sounds: Normal breath sounds.  Abdominal:     Palpations: Abdomen is soft.     Tenderness: There is abdominal tenderness.     Comments: Mild upper abdominal tenderness epigastrium and bilateral upper quadrants  Musculoskeletal:        General: No swelling.     Cervical back: Neck supple.  Skin:    General: Skin is warm and dry.     Capillary Refill: Capillary refill takes less than 2 seconds.  Neurological:     Mental Status: She is alert.  Psychiatric:        Mood and Affect: Mood normal.     ED Results / Procedures / Treatments   Labs (all labs ordered are listed, but only abnormal results are displayed) Labs Reviewed - No data to display  EKG None  Radiology CT ABDOMEN PELVIS W CONTRAST  Result Date: 11/23/2022 CLINICAL DATA:  87 year old female with history of rectal cancer presenting with constipation. Possible GI bleed. * Tracking Code: BO * EXAM: CT ABDOMEN AND PELVIS WITH CONTRAST TECHNIQUE: Multidetector CT imaging of the abdomen and pelvis was performed using the standard protocol following bolus administration of intravenous contrast. RADIATION DOSE REDUCTION: This exam was performed according to the departmental dose-optimization program which includes automated  exposure control, adjustment of the mA and/or kV according to patient size and/or use of iterative reconstruction technique. CONTRAST:  78m OMNIPAQUE IOHEXOL 300 MG/ML  SOLN COMPARISON:  CT of the abdomen and pelvis 06/19/2022. FINDINGS: Lower chest: Unremarkable. Hepatobiliary: No suspicious cystic or solid hepatic lesions. No intra or extrahepatic biliary ductal dilatation. Status post cholecystectomy. Pancreas: No pancreatic mass. No pancreatic ductal dilatation. No pancreatic or peripancreatic fluid collections or inflammatory changes. Spleen: Unremarkable. Adrenals/Urinary Tract: Status post right radical nephrectomy. No unexpected soft tissue mass in the nephrectomy bed to suggest locally recurrent disease. Right adrenal gland is not confidently identified may also be surgically absent. Left kidney and left adrenal gland are normal in appearance. No left hydroureteronephrosis. Urinary bladder is unremarkable in appearance.  Stomach/Bowel: The appearance of the stomach is normal. Large duodenal diverticulum extending from the third portion of the duodenum, without surrounding inflammatory changes. There is no pathologic dilatation of small bowel or colon. Scattered colonic diverticula are noted, without surrounding inflammatory changes to indicate an acute diverticulitis at this time. Normal appendix. Vascular/Lymphatic: Atherosclerosis throughout the abdominal aorta and pelvic vasculature. No lymphadenopathy noted in the abdomen or pelvis. Reproductive: Status post hysterectomy. Ovaries are not confidently identified may be surgically absent or atrophic. Other: No significant volume of ascites.  No pneumoperitoneum. Musculoskeletal: There are no aggressive appearing lytic or blastic lesions noted in the visualized portions of the skeleton. IMPRESSION: 1. No acute findings are noted in the abdomen or pelvis to account for the patient's symptoms. 2. Colonic diverticulosis without evidence of acute diverticulitis  at this time. 3. Aortic atherosclerosis. 4. No definite evidence of recurrent or metastatic disease in the abdomen or pelvis. 5. Additional incidental findings, similar to prior studies, as above. Electronically Signed   By: Vinnie Langton M.D.   On: 11/23/2022 06:44   DG Abdomen 1 View  Result Date: 11/23/2022 CLINICAL DATA:  86 year old female with history of epigastric pain, bloating and constipation. EXAM: ABDOMEN - 1 VIEW COMPARISON:  Abdominal radiograph 06/20/2021. FINDINGS: Gas and stool are seen scattered throughout the colon extending to the level of the distal rectum. No pathologic distension of small bowel is noted. Large volume of stool throughout the colon and rectum. No gross evidence of pneumoperitoneum. Surgical clips project over the right upper quadrant of the abdomen, likely from prior cholecystectomy. IMPRESSION: 1. Nonobstructive bowel gas pattern. 2. No pneumoperitoneum. 3. Large volume of stool throughout the colon and rectum, compatible with reported clinical history of constipation. Electronically Signed   By: Vinnie Langton M.D.   On: 11/23/2022 05:09    Procedures Procedures    Medications Ordered in ED Medications  ondansetron (ZOFRAN-ODT) disintegrating tablet 4 mg (4 mg Oral Given 11/23/22 1523)    ED Course/ Medical Decision Making/ A&P                           Medical Decision Making Differential diagnosis: Patient, gastritis, GERD, other  ED course: Patient recently here for constipation this morning.  She came back because she vomited up with the magnesium citrate.  Symptoms are otherwise unchanged, she has a reassuring exam.  She already had labs and CT this morning.  Discussed with my attending, plan to give her antiemetics and start her on Maalox.  Discussed with the patient that she should follow-up with gastroenterology as she states her symptoms have been going on for a year and she may benefit from endoscopy/colonoscopy.  She is given strict return  precautions.  Risk Prescription drug management.           Final Clinical Impression(s) / ED Diagnoses Final diagnoses:  Abdominal pain, unspecified abdominal location  Constipation, unspecified constipation type    Rx / DC Orders ED Discharge Orders          Ordered    polyethylene glycol (MIRALAX) 17 g packet  Daily        11/23/22 1525    ondansetron (ZOFRAN) 4 MG tablet  Every 6 hours        11/23/22 1526              Gwenevere Abbot, PA-C 11/23/22 1534    Davonna Belling, MD 11/23/22 1535

## 2022-11-23 NOTE — ED Notes (Signed)
Discharge paperwork given and verbally understood. 

## 2022-11-23 NOTE — Discharge Instructions (Addendum)
Drink a bottle of magnesium citrate solution when you get home.  You can get this from any drug store without a prescription.

## 2022-11-23 NOTE — ED Provider Notes (Signed)
[facility] Provider Note: Georgena Spurling, MD, FACEP  CSN: [id number] MRN: [id number] ARRIVAL: 11/22/22 at 2236 ROOM: DB010/DB010   CHIEF COMPLAINT  Abdominal Pain   HISTORY OF PRESENT ILLNESS  11/23/22 4:59 AM Sherri Burns is a 86 y.o. female with chronic constipation and hemorrhoids.  She is prone to frequent fecal impactions and so takes MiraLAX on a daily basis and frequently has to use Fleet enemas or suppositories to help relieve impacted stools.  She also drinks prune juice as needed.  She is here with an exacerbation of her symptoms but is vague about the timing.  Her abdomen is very swollen and she feels like the stool is collecting in her upper abdomen.  Her upper abdomen is painful (9 out of 10) and described as cramping.  She has also been vomiting.   Past Medical History:  Diagnosis Date   Abnormal EKG 02/07/2016   Inferolateral T wave inversion and ST depression.   Acute calculous cholecystitis    Anxiety    Arthritis    Chronic diastolic CHF (congestive heart failure) ([place]) 02/21/2018   Colon polyps 11/2008   tubular adenoma   Diabetes mellitus    Esophageal problem    esophageal dilation   GERD (gastroesophageal reflux disease)    + hpylori   GI bleeding 01/2018   Headache    Hyperlipidemia    Hypertension    Hypothyroidism    Ischemic colitis [facility])    Renal cancer, right ([place]) 2010   s/p Rt nephrectomy by Dr. Rosana Hoes   Renal insufficiency     Past Surgical History:  Procedure Laterality Date   ABDOMINAL HYSTERECTOMY  1978   partial   CATARACT EXTRACTION     CHOLECYSTECTOMY N/A 04/04/2016   Procedure: LAPAROSCOPIC CHOLECYSTECTOMY;  Surgeon: Autumn Messing III, MD;  Location: [facility];  Service: General;  Laterality: N/A;   COLONOSCOPY     ESOPHAGEAL DILATION  2016   HERNIA REPAIR     LAPAROSCOPIC CHOLECYSTECTOMY  04/04/2016   NEPHRECTOMY Right 1982   Dr. Rosana Hoes, urology, due to renal cancer    Family History  Problem Relation Age of Onset    Cancer Mother        colon, kidney cancer   Cancer Father        throat cancer   Suicidality Son    Cancer Sister    Heart attack Brother     Social History   Tobacco Use   Smoking status: Some Days    Packs/day: 0.25    Years: 55.00    Total pack years: 13.75    Types: Cigarettes   Smokeless tobacco: Never   Tobacco comments:    cut back amount still  trying to quit  Vaping Use   Vaping Use: Never used  Substance Use Topics   Alcohol use: No    Alcohol/week: 0.0 standard drinks of alcohol   Drug use: No    Prior to Admission medications   Medication Sig Start Date End Date Taking? Authorizing Provider  acetaminophen (TYLENOL) 325 MG tablet Take 2 tablets (650 mg total) by mouth every 6 (six) hours as needed for mild pain (or Fever >/= 101). Patient taking differently: Take 650 mg by mouth every 6 (six) hours as needed for mild pain or fever. 06/02/20   Allie Bossier, MD  allopurinol (ZYLOPRIM) 100 MG tablet Take 1 tablet (100 mg total) by mouth daily. 11/11/20   Trula Slade, DPM  amLODipine (NORVASC) 5 MG  tablet Take 1 tablet (5 mg total) by mouth daily. Patient taking differently: Take 10 mg by mouth daily. 12/06/20   Just, Laurita Quint, FNP  cholecalciferol (VITAMIN D3) 25 MCG (1000 UNIT) tablet Take 1,000 Units by mouth daily.    [provider]  diclofenac Sodium (VOLTAREN) 1 % GEL Apply 2 g topically 3 (three) times daily as needed (for joint pain). Patient taking differently: Apply 2 g topically 3 (three) times daily as needed (Joint pain). 09/13/20   Nuala Alpha A, PA-C  dicyclomine (BENTYL) 10 MG capsule TAKE ONE CAPSULE BY MOUTH TWICE A DAY Patient taking differently: Take 10 mg by mouth in the morning and at bedtime. 01/31/22   Zehr, Laban Emperor, PA-C  ezetimibe (ZETIA) 10 MG tablet Take 10 mg by mouth daily. 05/25/22   [provider]  fluticasone (FLONASE) 50 MCG/ACT nasal spray Place 1-2 sprays into both nostrils daily. 06/17/20   Daleen Squibb, MD  furosemide (LASIX) 40 MG tablet Take 0.5 tablets (20 mg total) by mouth daily. Patient taking differently: Take 40 mg by mouth daily. 08/08/20   Jacelyn Pi, Lilia Argue, MD  glipiZIDE (GLUCOTROL) 10 MG tablet Take 20 mg by mouth 2 (two) times daily before a meal.    [provider]  hydrocortisone (ANUSOL-HC) 2.5 % rectal cream Place 1 Application rectally 2 (two) times daily. 06/19/22   Tegeler, Gwenyth Allegra, MD  levothyroxine (SYNTHROID) 75 MCG tablet Take 1 tablet (75 mcg total) by mouth daily before breakfast. 08/18/20   Jacelyn Pi, Lilia Argue, MD  metoprolol tartrate (LOPRESSOR) 100 MG tablet Take 1 tablet (100 mg total) by mouth 2 (two) times daily. 06/16/20   Jacelyn Pi, Lilia Argue, MD  omeprazole (PRILOSEC) 20 MG capsule Take 1 capsule (20 mg total) by mouth daily. 05/26/22   Palumbo, April, MD  pantoprazole (PROTONIX) 40 MG tablet TAKE ONE TABLET BY MOUTH DAILY Patient taking differently: Take 40 mg by mouth daily. 02/15/22   Zehr, Janett Billow D, PA-C  polyethylene glycol (MIRALAX / GLYCOLAX) 17 g packet Take 17 g by mouth daily as needed for mild constipation. 06/02/20   Allie Bossier, MD  QUEtiapine (SEROQUEL) 25 MG tablet Take 25 mg by mouth at bedtime. 05/25/22   [provider]  vitamin E 100 UNIT capsule Take 100 Units by mouth daily.    [provider]    Allergies Ace inhibitors, Codeine, Januvia [sitagliptin], Metformin and related, Ciprocin-fluocin-procin [fluocinolone acetonide], Clonidine derivatives, Crestor [rosuvastatin calcium], Penicillins, Tessalon perles, and Zocor [simvastatin]   REVIEW OF SYSTEMS  Negative except as noted here or in the History of Present Illness.   PHYSICAL EXAMINATION  Initial Vital Signs Blood pressure (!) 145/86, pulse 78, temperature 98.9 F (37.2 C), resp. rate 18, height '5\' 5"'$  (1.651 m), weight 74.4 kg, SpO2 99 %.  Examination General: Well-developed, well-nourished female in no acute distress; appearance  consistent with age of record HENT: normocephalic; atraumatic Eyes: Normal appearance Neck: supple Heart: regular rate and rhythm Lungs: clear to auscultation bilaterally Abdomen: soft; distended; diffusely tender; bowel sounds hypoactive Rectal: External hemorrhoids; no stool palpated in rectal vault Extremities: No deformity; full range of motion; pulses normal Neurologic: Awake, alert; motor function intact in all extremities and symmetric; no facial droop Skin: Warm and dry Psychiatric: Normal mood and affect   RESULTS  Summary of this visit's results, reviewed and interpreted by myself:   EKG Interpretation  Date/Time:    Ventricular Rate:    PR Interval:  QRS Duration:   QT Interval:    QTC Calculation:   R Axis:     Text Interpretation:         Laboratory Studies: Results for orders placed or performed during the hospital encounter of 11/23/22 (from the past 24 hour(s))  CBC with Differential     Status: None   Collection Time: 11/23/22  5:35 AM  Result Value Ref Range   WBC 8.3 4.0 - 10.5 K/uL   RBC 4.42 3.87 - 5.11 MIL/uL   Hemoglobin 14.0 12.0 - 15.0 g/dL   HCT 42.9 36.0 - 46.0 %   MCV 97.1 80.0 - 100.0 fL   MCH 31.7 26.0 - 34.0 pg   MCHC 32.6 30.0 - 36.0 g/dL   RDW 13.9 11.5 - 15.5 %   Platelets 220 150 - 400 K/uL   nRBC 0.0 0.0 - 0.2 %   Neutrophils Relative % 49 %   Neutro Abs 4.1 1.7 - 7.7 K/uL   Lymphocytes Relative 42 %   Lymphs Abs 3.5 0.7 - 4.0 K/uL   Monocytes Relative 6 %   Monocytes Absolute 0.5 0.1 - 1.0 K/uL   Eosinophils Relative 2 %   Eosinophils Absolute 0.1 0.0 - 0.5 K/uL   Basophils Relative 1 %   Basophils Absolute 0.0 0.0 - 0.1 K/uL   Immature Granulocytes 0 %   Abs Immature Granulocytes 0.03 0.00 - 0.07 K/uL  Lipase, blood     Status: None   Collection Time: 11/23/22  5:35 AM  Result Value Ref Range   Lipase 37 11 - 51 U/L  Comprehensive metabolic panel     Status: Abnormal   Collection Time: 11/23/22  5:35 AM  Result  Value Ref Range   Sodium 140 135 - 145 mmol/L   Potassium 3.6 3.5 - 5.1 mmol/L   Chloride 101 98 - 111 mmol/L   CO2 28 22 - 32 mmol/L   Glucose, Bld 121 (H) 70 - 99 mg/dL   BUN 18 8 - 23 mg/dL   Creatinine, Ser 1.37 (H) 0.44 - 1.00 mg/dL   Calcium 10.6 (H) 8.9 - 10.3 mg/dL   Total Protein 8.6 (H) 6.5 - 8.1 g/dL   Albumin 5.0 3.5 - 5.0 g/dL   AST 17 15 - 41 U/L   ALT 14 0 - 44 U/L   Alkaline Phosphatase 57 38 - 126 U/L   Total Bilirubin 0.5 0.3 - 1.2 mg/dL   GFR, Estimated 38 (L) >60 mL/min   Anion gap 11 5 - 15  Urinalysis, Routine w reflex microscopic Urine, Clean Catch     Status: Abnormal   Collection Time: 11/23/22  6:48 AM  Result Value Ref Range   Color, Urine COLORLESS (A) YELLOW   APPearance CLEAR CLEAR   Specific Gravity, Urine 1.015 1.005 - 1.030   pH 7.0 5.0 - 8.0   Glucose, UA NEGATIVE NEGATIVE mg/dL   Hgb urine dipstick NEGATIVE NEGATIVE   Bilirubin Urine NEGATIVE NEGATIVE   Ketones, ur NEGATIVE NEGATIVE mg/dL   Protein, ur NEGATIVE NEGATIVE mg/dL   Nitrite NEGATIVE NEGATIVE   Leukocytes,Ua NEGATIVE NEGATIVE   Imaging Studies: CT ABDOMEN PELVIS W CONTRAST  Result Date: 11/23/2022 CLINICAL DATA:  86 year old female with history of rectal cancer presenting with constipation. Possible GI bleed. * Tracking Code: BO * EXAM: CT ABDOMEN AND PELVIS WITH CONTRAST TECHNIQUE: Multidetector CT imaging of the abdomen and pelvis was performed using the standard protocol following bolus administration of intravenous contrast. RADIATION DOSE REDUCTION: This  exam was performed according to the departmental dose-optimization program which includes automated exposure control, adjustment of the mA and/or kV according to patient size and/or use of iterative reconstruction technique. CONTRAST:  49m OMNIPAQUE IOHEXOL 300 MG/ML  SOLN COMPARISON:  CT of the abdomen and pelvis 06/19/2022. FINDINGS: Lower chest: Unremarkable. Hepatobiliary: No suspicious cystic or solid hepatic lesions. No  intra or extrahepatic biliary ductal dilatation. Status post cholecystectomy. Pancreas: No pancreatic mass. No pancreatic ductal dilatation. No pancreatic or peripancreatic fluid collections or inflammatory changes. Spleen: Unremarkable. Adrenals/Urinary Tract: Status post right radical nephrectomy. No unexpected soft tissue mass in the nephrectomy bed to suggest locally recurrent disease. Right adrenal gland is not confidently identified may also be surgically absent. Left kidney and left adrenal gland are normal in appearance. No left hydroureteronephrosis. Urinary bladder is unremarkable in appearance. Stomach/Bowel: The appearance of the stomach is normal. Large duodenal diverticulum extending from the third portion of the duodenum, without surrounding inflammatory changes. There is no pathologic dilatation of small bowel or colon. Scattered colonic diverticula are noted, without surrounding inflammatory changes to indicate an acute diverticulitis at this time. Normal appendix. Vascular/Lymphatic: Atherosclerosis throughout the abdominal aorta and pelvic vasculature. No lymphadenopathy noted in the abdomen or pelvis. Reproductive: Status post hysterectomy. Ovaries are not confidently identified may be surgically absent or atrophic. Other: No significant volume of ascites.  No pneumoperitoneum. Musculoskeletal: There are no aggressive appearing lytic or blastic lesions noted in the visualized portions of the skeleton. IMPRESSION: 1. No acute findings are noted in the abdomen or pelvis to account for the patient's symptoms. 2. Colonic diverticulosis without evidence of acute diverticulitis at this time. 3. Aortic atherosclerosis. 4. No definite evidence of recurrent or metastatic disease in the abdomen or pelvis. 5. Additional incidental findings, similar to prior studies, as above. Electronically Signed   By: DVinnie LangtonM.D.   On: 11/23/2022 06:44   DG Abdomen 1 View  Result Date: 11/23/2022 CLINICAL  DATA:  86year old female with history of epigastric pain, bloating and constipation. EXAM: ABDOMEN - 1 VIEW COMPARISON:  Abdominal radiograph 06/20/2021. FINDINGS: Gas and stool are seen scattered throughout the colon extending to the level of the distal rectum. No pathologic distension of small bowel is noted. Large volume of stool throughout the colon and rectum. No gross evidence of pneumoperitoneum. Surgical clips project over the right upper quadrant of the abdomen, likely from prior cholecystectomy. IMPRESSION: 1. Nonobstructive bowel gas pattern. 2. No pneumoperitoneum. 3. Large volume of stool throughout the colon and rectum, compatible with reported clinical history of constipation. Electronically Signed   By: DVinnie LangtonM.D.   On: 11/23/2022 05:09    ED COURSE and MDM  Nursing notes, initial and subsequent vitals signs, including pulse oximetry, reviewed and interpreted by myself.  Vitals:   11/23/22 0500 11/23/22 0600 11/23/22 0615 11/23/22 0648  BP: (!) 159/95 (!) 157/70 (!) 144/75 (!) 141/123  Pulse: 83 78 73 81  Resp: '16 18 18 18  '$ Temp:      SpO2: 100% 93% 96% 100%  Weight:      Height:       Medications  ondansetron (ZOFRAN-ODT) disintegrating tablet 4 mg (4 mg Oral Given 11/23/22 0457)  ondansetron (ZOFRAN) injection 4 mg (4 mg Intravenous Given 11/23/22 0533)  iohexol (OMNIPAQUE) 300 MG/ML solution 100 mL (60 mLs Intravenous Contrast Given 11/23/22 0623)   7:12 AM Apart from stool burden seen on radiographs the CT scan shows no evidence of significant pathology.  Specifically she has  no evidence of obstruction.  I suspect she is constipated and the constipation is high as there is no stool in the rectum to manually disimpact that would respond to an enema.  Will recommend that she drink a bottle of magnesium citrate when she gets home.  Her caretaker will see that she gets it.   PROCEDURES  Procedures   ED DIAGNOSES     ICD-10-CM   1. Chronic constipation   K59.09          Julann Mcgilvray, Jenny Reichmann, MD 11/23/22 [id number]

## 2022-12-11 ENCOUNTER — Telehealth: Payer: Self-pay | Admitting: Gastroenterology

## 2022-12-11 NOTE — Telephone Encounter (Signed)
Hi Dr. Tarri Glenn,    We received a referral for patient to be evaluated for idiopathic constipation. He was previously your patient and is now requesting to come back to [facility] because current GI is too far for her. Records are available within Epic for you to review and advise on scheduling.    Thanks

## 2022-12-12 ENCOUNTER — Other Ambulatory Visit: Payer: Self-pay

## 2022-12-12 ENCOUNTER — Ambulatory Visit (HOSPITAL_COMMUNITY): Admission: EM | Admit: 2022-12-12 | Discharge: 2022-12-12 | Payer: Medicare Other

## 2022-12-12 ENCOUNTER — Observation Stay (HOSPITAL_COMMUNITY)
Admission: EM | Admit: 2022-12-12 | Discharge: 2022-12-15 | Disposition: A | Discharge status: Home / Self Care | Payer: Medicare Other | Attending: Internal Medicine | Admitting: Internal Medicine

## 2022-12-12 ENCOUNTER — Encounter (HOSPITAL_COMMUNITY): Payer: Self-pay

## 2022-12-12 DIAGNOSIS — N183 Chronic kidney disease, stage 3 unspecified: Secondary | ICD-10-CM | POA: Diagnosis present

## 2022-12-12 DIAGNOSIS — Z87891 Personal history of nicotine dependence: Secondary | ICD-10-CM | POA: Insufficient documentation

## 2022-12-12 DIAGNOSIS — B370 Candidal stomatitis: Secondary | ICD-10-CM | POA: Diagnosis present

## 2022-12-12 DIAGNOSIS — N1832 Chronic kidney disease, stage 3b: Secondary | ICD-10-CM | POA: Insufficient documentation

## 2022-12-12 DIAGNOSIS — I5032 Chronic diastolic (congestive) heart failure: Secondary | ICD-10-CM | POA: Diagnosis not present

## 2022-12-12 DIAGNOSIS — Z79899 Other long term (current) drug therapy: Secondary | ICD-10-CM | POA: Diagnosis not present

## 2022-12-12 DIAGNOSIS — K922 Gastrointestinal hemorrhage, unspecified: Secondary | ICD-10-CM

## 2022-12-12 DIAGNOSIS — K2970 Gastritis, unspecified, without bleeding: Secondary | ICD-10-CM

## 2022-12-12 DIAGNOSIS — K317 Polyp of stomach and duodenum: Secondary | ICD-10-CM

## 2022-12-12 DIAGNOSIS — E1142 Type 2 diabetes mellitus with diabetic polyneuropathy: Secondary | ICD-10-CM | POA: Diagnosis present

## 2022-12-12 DIAGNOSIS — K649 Unspecified hemorrhoids: Secondary | ICD-10-CM | POA: Diagnosis present

## 2022-12-12 DIAGNOSIS — Z7984 Long term (current) use of oral hypoglycemic drugs: Secondary | ICD-10-CM | POA: Diagnosis not present

## 2022-12-12 DIAGNOSIS — E039 Hypothyroidism, unspecified: Secondary | ICD-10-CM | POA: Diagnosis present

## 2022-12-12 DIAGNOSIS — K3189 Other diseases of stomach and duodenum: Secondary | ICD-10-CM | POA: Diagnosis not present

## 2022-12-12 DIAGNOSIS — I130 Hypertensive heart and chronic kidney disease with heart failure and stage 1 through stage 4 chronic kidney disease, or unspecified chronic kidney disease: Secondary | ICD-10-CM | POA: Diagnosis not present

## 2022-12-12 DIAGNOSIS — R109 Unspecified abdominal pain: Secondary | ICD-10-CM | POA: Diagnosis present

## 2022-12-12 DIAGNOSIS — K920 Hematemesis: Secondary | ICD-10-CM | POA: Diagnosis not present

## 2022-12-12 DIAGNOSIS — E785 Hyperlipidemia, unspecified: Secondary | ICD-10-CM | POA: Diagnosis present

## 2022-12-12 DIAGNOSIS — E1122 Type 2 diabetes mellitus with diabetic chronic kidney disease: Secondary | ICD-10-CM | POA: Insufficient documentation

## 2022-12-12 LAB — COMPREHENSIVE METABOLIC PANEL
ALT: 45 U/L — ABNORMAL HIGH (ref 0–44)
AST: 34 U/L (ref 15–41)
Albumin: 4.3 g/dL (ref 3.5–5.0)
Alkaline Phosphatase: 62 U/L (ref 38–126)
Anion gap: 11 (ref 5–15)
BUN: 12 mg/dL (ref 8–23)
CO2: 26 mmol/L (ref 22–32)
Calcium: 9.6 mg/dL (ref 8.9–10.3)
Chloride: 100 mmol/L (ref 98–111)
Creatinine, Ser: 1.59 mg/dL — ABNORMAL HIGH (ref 0.44–1.00)
GFR, Estimated: 32 mL/min — ABNORMAL LOW (ref >60)
Glucose, Bld: 113 mg/dL — ABNORMAL HIGH (ref 70–99)
Potassium: 4.7 mmol/L (ref 3.5–5.1)
Sodium: 137 mmol/L (ref 135–145)
Total Bilirubin: 0.8 mg/dL (ref 0.3–1.2)
Total Protein: 7.4 g/dL (ref 6.5–8.1)

## 2022-12-12 LAB — CBC
HCT: 45 % (ref 36.0–46.0)
Hemoglobin: 14.3 g/dL (ref 12.0–15.0)
MCH: 32.2 pg (ref 26.0–34.0)
MCHC: 31.8 g/dL (ref 30.0–36.0)
MCV: 101.4 fL — ABNORMAL HIGH (ref 80.0–100.0)
Platelets: 219 10*3/uL (ref 150–400)
RBC: 4.44 MIL/uL (ref 3.87–5.11)
RDW: 14.1 % (ref 11.5–15.5)
WBC: 8.9 10*3/uL (ref 4.0–10.5)
nRBC: 0 % (ref 0.0–0.2)

## 2022-12-12 LAB — TYPE AND SCREEN
ABO/RH(D): O POS
Antibody Screen: NEGATIVE

## 2022-12-12 LAB — CBG MONITORING, ED
Glucose-Capillary: 127 mg/dL — ABNORMAL HIGH (ref 70–99)
Glucose-Capillary: 120 mg/dL — ABNORMAL HIGH (ref 70–99)

## 2022-12-12 LAB — LIPASE, BLOOD: Lipase: 32 U/L (ref 11–51)

## 2022-12-12 LAB — TROPONIN I (HIGH SENSITIVITY): Troponin I (High Sensitivity): 5 ng/L (ref <18)

## 2022-12-12 NOTE — ED Triage Notes (Signed)
Patient has been dealing with gastro issues for 1 year but reports vomited dark blood yesterday and dark blood in stool.  Complains of abd pain weakness and dizziness that all started yesterday as well. Denies blood thinners.

## 2022-12-12 NOTE — ED Provider Notes (Signed)
[facility]    CSN: [id number] Arrival date & time: 12/12/22  1120     History   Chief Complaint Chief Complaint  Patient presents with   Abdominal Pain   Rectal Bleeding   Nevus    Painful on the neck     HPI Sherri Burns is a 87 y.o. female.  Presents with 1 week history of worsening abdominal pain Had it for about a year but increased in last several days  Reports episodes of vomiting yesterday with "dark red blood" mixed in Has been having painful BM, BRBPR when she wipes but also indicates dark blood in her stool  Dizziness and feeling weak  Hx of lower GI bleed, most recent in June 2021 Two weeks ago hgb was 14  Hx of DM2  Past Medical History:  Diagnosis Date   Abnormal EKG 02/07/2016   Inferolateral T wave inversion and ST depression.   Acute calculous cholecystitis    Anxiety    Arthritis    Chronic diastolic CHF (congestive heart failure) ([place]) 02/21/2018   Colon polyps 11/2008   tubular adenoma   Diabetes mellitus    Esophageal problem    esophageal dilation   GERD (gastroesophageal reflux disease)    + hpylori   GI bleeding 01/2018   Headache    Hyperlipidemia    Hypertension    Hypothyroidism    Ischemic colitis [facility])    Renal cancer, right ([place]) 2010   s/p Rt nephrectomy by Dr. Rosana Hoes   Renal insufficiency     Patient Active Problem List   Diagnosis Date Noted   Grief 06/19/2022   Acute GI bleeding 07/13/2021   Lower abdominal pain 04/18/2021   Pressure injury of right heel, unstageable ([place]) 12/06/2020   Lower GI bleed 05/31/2020   Statin intolerance 11/12/2019   GAD (generalized anxiety disorder) 08/07/2019   Long term prescription benzodiazepine use 08/07/2019   GI bleed 04/10/2019   S/p nephrectomy 06/24/2018   Chronic diastolic CHF (congestive heart failure) ([place]) 02/21/2018   Acute lower GI bleeding 02/20/2018   Hyperlipidemia    Abnormal EKG 02/07/2016   Type 2 diabetes mellitus with diabetic polyneuropathy,  without long-term current use of insulin ([place]) 09/23/2012   Essential hypertension    Gastroesophageal reflux disease without esophagitis    Hypothyroidism    CKD (chronic kidney disease), stage III ([place])     Past Surgical History:  Procedure Laterality Date   ABDOMINAL HYSTERECTOMY  1978   partial   CATARACT EXTRACTION     CHOLECYSTECTOMY N/A 04/04/2016   Procedure: LAPAROSCOPIC CHOLECYSTECTOMY;  Surgeon: Autumn Messing III, MD;  Location: [place];  Service: General;  Laterality: N/A;   COLONOSCOPY     ESOPHAGEAL DILATION  2016   HERNIA REPAIR     LAPAROSCOPIC CHOLECYSTECTOMY  04/04/2016   NEPHRECTOMY Right 1982   Dr. Rosana Hoes, urology, due to renal cancer    OB History   No obstetric history on file.      Home Medications    Prior to Admission medications   Medication Sig Start Date End Date Taking? Authorizing Provider  allopurinol (ZYLOPRIM) 100 MG tablet Take 1 tablet (100 mg total) by mouth daily. 11/11/20  Yes Trula Slade, DPM  amLODipine (NORVASC) 5 MG tablet Take 1 tablet (5 mg total) by mouth daily. Patient taking differently: Take 10 mg by mouth daily. 12/06/20  Yes Just, Laurita Quint, FNP  cholecalciferol (VITAMIN D3) 25 MCG (1000 UNIT) tablet Take 1,000 Units by  mouth daily.   Yes [provider]  dicyclomine (BENTYL) 10 MG capsule TAKE ONE CAPSULE BY MOUTH TWICE A DAY Patient taking differently: Take 10 mg by mouth in the morning and at bedtime. 01/31/22  Yes Zehr, Laban Emperor, PA-C  ezetimibe (ZETIA) 10 MG tablet Take 10 mg by mouth daily. 05/25/22  Yes [provider]  furosemide (LASIX) 40 MG tablet Take 0.5 tablets (20 mg total) by mouth daily. Patient taking differently: Take 40 mg by mouth daily. 08/08/20  Yes Jacelyn Pi, Irma M, MD  glipiZIDE (GLUCOTROL) 10 MG tablet Take 20 mg by mouth 2 (two) times daily before a meal.   Yes [provider]  hyoscyamine (LEVBID) 0.375 MG 12 hr tablet Take 0.375 mg by mouth every 12 (twelve) hours as  needed. 08/07/22  Yes [provider]  LINZESS 72 MCG capsule Take 72 mcg by mouth every morning. 11/27/22  Yes [provider]  metoprolol tartrate (LOPRESSOR) 100 MG tablet Take 1 tablet (100 mg total) by mouth 2 (two) times daily. 06/16/20  Yes Jacelyn Pi, Lilia Argue, MD  omeprazole (PRILOSEC) 20 MG capsule Take 1 capsule (20 mg total) by mouth daily. 05/26/22  Yes Palumbo, April, MD  ondansetron (ZOFRAN) 4 MG tablet Take 1 tablet (4 mg total) by mouth every 6 (six) hours. 11/23/22  Yes Beatty, Celeste A, PA-C  QUEtiapine (SEROQUEL) 25 MG tablet Take 25 mg by mouth at bedtime. 05/25/22  Yes [provider]  vitamin E 100 UNIT capsule Take 100 Units by mouth daily.   Yes [provider]    Family History Family History  Problem Relation Age of Onset   Cancer Mother        colon, kidney cancer   Cancer Father        throat cancer   Suicidality Son    Cancer Sister    Heart attack Brother     Social History Social History   Tobacco Use   Smoking status: Some Days    Packs/day: 0.25    Years: 55.00    Total pack years: 13.75    Types: Cigarettes   Smokeless tobacco: Never   Tobacco comments:    cut back amount still  trying to quit  Vaping Use   Vaping Use: Never used  Substance Use Topics   Alcohol use: No    Alcohol/week: 0.0 standard drinks of alcohol   Drug use: No     Allergies   Ace inhibitors, Codeine, Januvia [sitagliptin], Metformin and related, Ciprocin-fluocin-procin [fluocinolone acetonide], Clonidine derivatives, Crestor [rosuvastatin calcium], Penicillins, Tessalon perles, and Zocor [simvastatin]   Review of Systems Review of Systems  Gastrointestinal:  Positive for abdominal pain.   As per HPI  Physical Exam Triage Vital Signs ED Triage Vitals  Enc Vitals Group     BP 12/12/22 1150 105/70     Pulse Rate 12/12/22 1150 81     Resp 12/12/22 1150 16     Temp 12/12/22 1150 99 F (37.2 C)     Temp Source 12/12/22 1150  Oral     SpO2 12/12/22 1150 96 %     Weight 12/12/22 1149 163 lb 2.3 oz (74 kg)     Height 12/12/22 1149 '5\' 5"'$  (1.651 m)     Head Circumference --      Peak Flow --      Pain Score 12/12/22 1142 10     Pain Loc --      Pain Edu? --  Excl. in GC? --    No data found.  Updated Vital Signs BP 105/70 (BP Location: Left Arm)   Pulse 81   Temp 99 F (37.2 C) (Oral)   Resp 16   Ht '5\' 5"'$  (1.651 m)   Wt 163 lb 2.3 oz (74 kg)   SpO2 96%   BMI 27.15 kg/m    Physical Exam Vitals and nursing note reviewed.  Constitutional:      General: She is not in acute distress. Cardiovascular:     Rate and Rhythm: Normal rate and regular rhythm.  Pulmonary:     Effort: Pulmonary effort is normal.     Breath sounds: Normal breath sounds.  Abdominal:     General: There is no distension.     Tenderness: There is abdominal tenderness in the epigastric area.  Skin:    General: Skin is warm and dry.  Neurological:     Mental Status: She is alert and oriented to person, place, and time.     UC Treatments / Results  Labs (all labs ordered are listed, but only abnormal results are displayed) Labs Reviewed  CBG MONITORING, ED - Abnormal; Notable for the following components:      Result Value   Glucose-Capillary 127 (*)    All other components within normal limits    EKG   Radiology No results found.  Procedures Procedures (including critical care time)  Medications Ordered in UC Medications - No data to display  Initial Impression / Assessment and Plan / UC Course  I have reviewed the triage vital signs and the nursing notes.  Pertinent labs & imaging results that were available during my care of the patient were reviewed by me and considered in my medical decision making (see chart for details).  CBG 127 Vitals are stable With patient symptoms, concern for GI bleed Discussed with her age and history, needs higher level of care in the emergency department. Patient  understands urgent care does not have the resources to evaluate this. Offered transport. Friend is bedside and will transport her via POV  Final Clinical Impressions(s) / UC Diagnoses   Final diagnoses:  Lower GI bleed     Discharge Instructions      D/C to ED     ED Prescriptions   None    PDMP not reviewed this encounter.   Steven Basso, Wells Guiles, [place] 12/12/22 1235

## 2022-12-12 NOTE — ED Provider Triage Note (Signed)
Emergency Medicine Provider Triage Evaluation Note  Sherri Burns , a 87 y.o. female  was evaluated in triage.  Pt complains of 1 week history of worsening abdominal pain.  She reports that she has had GI issues for the past year, but increased the last several days.  She also reports episodes yesterday vomiting of "dark red blood" and streaks of bright red blood in mucus.  She is also been having painful bowel movements and bright red blood when she wipes.  She also states she has had dark blood in her stool.  Has associated dizziness and weakness.  History of lower GI bleed.  Not on blood thinners.   Review of Systems  Positive: As above Negative: As above  Physical Exam  BP (!) 155/106 (BP Location: Right Arm)   Pulse 93   Temp 99.8 F (37.7 C) (Oral)   Resp 18   Ht '5\' 5"'$  (1.651 m)   Wt 73.9 kg   SpO2 100%   BMI 27.12 kg/m  Gen:   Awake, no distress   Resp:  Normal effort  MSK:   Moves extremities without difficulty  Other:    Medical Decision Making  Medically screening exam initiated at 2:19 PM.  Appropriate orders placed.  [name] was informed that the remainder of the evaluation will be completed by another provider, this initial triage assessment does not replace that evaluation, and the importance of remaining in the ED until their evaluation is complete.     Theressa Stamps R, [place] 12/12/22 1427

## 2022-12-12 NOTE — Discharge Instructions (Signed)
D/C to ED

## 2022-12-12 NOTE — ED Notes (Signed)
Patient is being discharged from the Urgent Care and sent to the Emergency Department via persona; vehicle. Per [facility] PA, patient is in need of higher level of care due to rectal and oral bleeding. Patient is aware and verbalizes understanding of plan of care.  Vitals:   12/12/22 1150  BP: 105/70  Pulse: 81  Resp: 16  Temp: 99 F (37.2 C)  SpO2: 96%

## 2022-12-12 NOTE — ED Triage Notes (Addendum)
Chief Complaint: Patient having dizziness, stomach.back/neck pain. Patient states there is a mole on the back of the right side of the neck that is painful. Patient having nausea and emesis yesterday with her stomach pain. Patient has been constipated. Set to see GI but no soon appointments. Blood after bowel movements on the toilet paper, states yesterday noticed blood in the toilet with the stool that was liquid. Patient states a history of hemorrhoids.   Onset: neck pain x over 3 months, stomach pain x1 year. Both complaints worse in the last few days   Prescriptions or OTC medications tried: Yes- baking soda and water for gas     with mild relief  Sick exposure: No  New foods, medications, or products: No  Recent Travel: No

## 2022-12-12 NOTE — ED Notes (Signed)
Called x3 repeat labs and no response

## 2022-12-13 ENCOUNTER — Emergency Department (HOSPITAL_COMMUNITY): Payer: Medicare Other

## 2022-12-13 ENCOUNTER — Encounter (HOSPITAL_COMMUNITY): Payer: Self-pay | Admitting: Internal Medicine

## 2022-12-13 DIAGNOSIS — B370 Candidal stomatitis: Secondary | ICD-10-CM | POA: Diagnosis present

## 2022-12-13 DIAGNOSIS — K649 Unspecified hemorrhoids: Secondary | ICD-10-CM | POA: Diagnosis present

## 2022-12-13 DIAGNOSIS — K920 Hematemesis: Secondary | ICD-10-CM | POA: Diagnosis not present

## 2022-12-13 DIAGNOSIS — K922 Gastrointestinal hemorrhage, unspecified: Secondary | ICD-10-CM | POA: Diagnosis not present

## 2022-12-13 LAB — HEMOGLOBIN A1C
Hgb A1c MFr Bld: 6.9 % — ABNORMAL HIGH (ref 4.8–5.6)
Mean Plasma Glucose: 151.33 mg/dL

## 2022-12-13 LAB — CBC
HCT: 42.7 % (ref 36.0–46.0)
Hemoglobin: 14.7 g/dL (ref 12.0–15.0)
MCH: 33.9 pg (ref 26.0–34.0)
MCHC: 34.4 g/dL (ref 30.0–36.0)
MCV: 98.6 fL (ref 80.0–100.0)
Platelets: DECREASED 10*3/uL (ref 150–400)
RBC: 4.33 MIL/uL (ref 3.87–5.11)
RDW: 14.4 % (ref 11.5–15.5)
WBC: 4.9 10*3/uL (ref 4.0–10.5)
nRBC: 0.4 % — ABNORMAL HIGH (ref 0.0–0.2)

## 2022-12-13 MED ORDER — QUETIAPINE FUMARATE 25 MG PO TABS
25.0000 mg | ORAL_TABLET | Freq: Every day | ORAL | Status: DC
  Administered 2022-12-13 – 2022-12-14 (×2): 25 mg via ORAL
  Filled 2022-12-13 (×2): qty 1

## 2022-12-13 MED ORDER — LACTATED RINGERS IV SOLN: INTRAVENOUS | Status: DC

## 2022-12-13 MED ORDER — ACETAMINOPHEN 500 MG PO TABS
1000.0000 mg | ORAL_TABLET | ORAL | Status: AC
  Administered 2022-12-13: 1000 mg via ORAL
  Filled 2022-12-13: qty 2

## 2022-12-13 MED ORDER — FLUCONAZOLE 100MG IVPB
100.0000 mg | Freq: Once | INTRAVENOUS | Status: AC
  Administered 2022-12-13: 100 mg via INTRAVENOUS
  Filled 2022-12-13: qty 50

## 2022-12-13 MED ORDER — PANTOPRAZOLE SODIUM 40 MG PO TBEC
40.0000 mg | DELAYED_RELEASE_TABLET | Freq: Two times a day (BID) | ORAL | Status: DC
  Administered 2022-12-13 – 2022-12-15 (×4): 40 mg via ORAL
  Filled 2022-12-13 (×4): qty 1

## 2022-12-13 MED ORDER — ONDANSETRON HCL 4 MG/2ML IJ SOLN: 4.0000 mg | Freq: Four times a day (QID) | INTRAMUSCULAR | Status: DC | PRN

## 2022-12-13 MED ORDER — IOHEXOL 350 MG/ML SOLN
65.0000 mL | Freq: Once | INTRAVENOUS | Status: AC | PRN
  Administered 2022-12-13: 65 mL via INTRAVENOUS

## 2022-12-13 MED ORDER — MORPHINE SULFATE (PF) 2 MG/ML IV SOLN: 2.0000 mg | INTRAVENOUS | Status: DC | PRN

## 2022-12-13 MED ORDER — ALLOPURINOL 100 MG PO TABS
100.0000 mg | ORAL_TABLET | Freq: Every morning | ORAL | Status: DC
  Administered 2022-12-14 – 2022-12-15 (×2): 100 mg via ORAL
  Filled 2022-12-13 (×2): qty 1

## 2022-12-13 MED ORDER — SODIUM CHLORIDE 0.9 % IV SOLN: INTRAVENOUS | Status: DC

## 2022-12-13 MED ORDER — ACETAMINOPHEN 325 MG PO TABS
650.0000 mg | ORAL_TABLET | Freq: Four times a day (QID) | ORAL | Status: DC | PRN
  Administered 2022-12-15: 650 mg via ORAL
  Filled 2022-12-13: qty 2

## 2022-12-13 MED ORDER — INSULIN ASPART 100 UNIT/ML IJ SOLN
0.0000 [IU] | Freq: Three times a day (TID) | INTRAMUSCULAR | Status: DC
  Administered 2022-12-15: 3 [IU] via SUBCUTANEOUS

## 2022-12-13 MED ORDER — AMLODIPINE BESYLATE 10 MG PO TABS
10.0000 mg | ORAL_TABLET | Freq: Every morning | ORAL | Status: DC
  Administered 2022-12-14 – 2022-12-15 (×2): 10 mg via ORAL
  Filled 2022-12-13 (×2): qty 1

## 2022-12-13 MED ORDER — METOPROLOL TARTRATE 50 MG PO TABS
100.0000 mg | ORAL_TABLET | Freq: Two times a day (BID) | ORAL | Status: DC
  Administered 2022-12-13 – 2022-12-15 (×3): 100 mg via ORAL
  Filled 2022-12-13 (×4): qty 2

## 2022-12-13 MED ORDER — PANTOPRAZOLE SODIUM 40 MG IV SOLR: 40.0000 mg | Freq: Two times a day (BID) | INTRAVENOUS | Status: DC

## 2022-12-13 MED ORDER — PANTOPRAZOLE 80MG IVPB - SIMPLE MED
80.0000 mg | Freq: Once | INTRAVENOUS | Status: AC
  Administered 2022-12-13: 80 mg via INTRAVENOUS
  Filled 2022-12-13: qty 100

## 2022-12-13 MED ORDER — HYDROCORTISONE ACETATE 25 MG RE SUPP
25.0000 mg | Freq: Two times a day (BID) | RECTAL | Status: DC
  Filled 2022-12-13 (×6): qty 1

## 2022-12-13 MED ORDER — FLUCONAZOLE 100 MG PO TABS
100.0000 mg | ORAL_TABLET | Freq: Every day | ORAL | Status: DC
  Administered 2022-12-14: 100 mg via ORAL
  Filled 2022-12-13: qty 1

## 2022-12-13 MED ORDER — ONDANSETRON HCL 4 MG PO TABS: 4.0000 mg | ORAL_TABLET | Freq: Four times a day (QID) | ORAL | Status: DC | PRN

## 2022-12-13 MED ORDER — ONDANSETRON HCL 4 MG/2ML IJ SOLN
4.0000 mg | Freq: Once | INTRAMUSCULAR | Status: AC
  Administered 2022-12-13: 4 mg via INTRAVENOUS
  Filled 2022-12-13: qty 2

## 2022-12-13 MED ORDER — ACETAMINOPHEN 650 MG RE SUPP: 650.0000 mg | Freq: Four times a day (QID) | RECTAL | Status: DC | PRN

## 2022-12-13 MED ORDER — DICYCLOMINE HCL 10 MG PO CAPS
10.0000 mg | ORAL_CAPSULE | Freq: Every morning | ORAL | Status: DC
  Administered 2022-12-14 – 2022-12-15 (×2): 10 mg via ORAL
  Filled 2022-12-13 (×2): qty 1

## 2022-12-13 MED ORDER — HYDRALAZINE HCL 20 MG/ML IJ SOLN: 5.0000 mg | INTRAMUSCULAR | Status: DC | PRN
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
## 2022-12-13 NOTE — ED Notes (Signed)
ED TO INPATIENT HANDOFF REPORT  ED Nurse Name and Phone #: Josanna Hefel RN [phone]  S Name/Age/Gender Sherri Burns 87 y.o. female Room/Bed: 022C/022C  Code Status   Code Status: Full Code  Home/SNF/Other Home Patient oriented to: self, place, time, and situation Is this baseline? Yes   Triage Complete: Triage complete  Chief Complaint Acute upper GI bleeding [K92.2]  Triage Note Patient has been dealing with gastro issues for 1 year but reports vomited dark blood yesterday and dark blood in stool.  Complains of abd pain weakness and dizziness that all started yesterday as well. Denies blood thinners.    Allergies Allergies  Allergen Reactions   Ace Inhibitors Swelling    See 02/16/19    Codeine Other (See Comments)    Hallucinations    Januvia [Sitagliptin] Diarrhea   Metformin And Related Other (See Comments)    GI upset   Tessalon Perles [Benzonatate] Other (See Comments)    GI upset   Ciprocin-Fluocin-Procin [Fluocinolone Acetonide] Other (See Comments)    Unknown reaction   Clonidine Derivatives Other (See Comments)    Dry mouth   Crestor [Rosuvastatin Calcium] Other (See Comments)    Myalgias    Penicillins Other (See Comments)    Hallucinations    Zocor [Simvastatin] Other (See Comments)    GI upset     Level of Care/Admitting Diagnosis ED Disposition     ED Disposition  Admit   Condition  --   [place]: [place] [[id number]]  Level of Care: Med-Surg [16]  May place patient in observation at [facility] or [place] if equivalent level of care is available:: Yes  Covid Evaluation: Asymptomatic - no recent exposure (last 10 days) testing not required  Diagnosis: Acute upper GI bleeding [177939]  Admitting Physician: Karmen Bongo [[id number]]  Attending Physician: Karmen Bongo [[id number]]          B Medical/Surgery History Past Medical History:  Diagnosis Date   Abnormal EKG 02/07/2016   Inferolateral T wave  inversion and ST depression.   Acute calculous cholecystitis    Anxiety    Arthritis    Chronic diastolic CHF (congestive heart failure) ([place]) 02/21/2018   Colon polyps 11/2008   tubular adenoma   Diabetes mellitus    Esophageal problem    esophageal dilation   GERD (gastroesophageal reflux disease)    + hpylori   GI bleeding 01/2018   Headache    Hyperlipidemia    Hypertension    Hypothyroidism    Ischemic colitis [facility])    Renal cancer, right ([place]) 2010   s/p Rt nephrectomy by Dr. Rosana Hoes   Renal insufficiency    Past Surgical History:  Procedure Laterality Date   ABDOMINAL HYSTERECTOMY  1978   partial   CATARACT EXTRACTION     CHOLECYSTECTOMY N/A 04/04/2016   Procedure: LAPAROSCOPIC CHOLECYSTECTOMY;  Surgeon: Autumn Messing III, MD;  Location: [facility];  Service: General;  Laterality: N/A;   COLONOSCOPY     ESOPHAGEAL DILATION  2016   HERNIA REPAIR     LAPAROSCOPIC CHOLECYSTECTOMY  04/04/2016   NEPHRECTOMY Right 1982   Dr. Rosana Hoes, urology, due to renal cancer     A IV Location/Drains/Wounds Patient Lines/Drains/Airways Status     Active Line/Drains/Airways     Name Placement date Placement time Site Days   Peripheral IV 12/13/22 20 G 1.88" Left;Lateral Forearm 12/13/22  1407  Forearm  less than 1  Intake/Output Last 24 hours No intake or output data in the 24 hours ending 12/13/22 1730  Labs/Imaging Results for orders placed or performed during the hospital encounter of 12/12/22 (from the past 48 hour(s))  Comprehensive metabolic panel     Status: Abnormal   Collection Time: 12/12/22  2:05 PM  Result Value Ref Range   Sodium 137 135 - 145 mmol/L   Potassium 4.7 3.5 - 5.1 mmol/L   Chloride 100 98 - 111 mmol/L   CO2 26 22 - 32 mmol/L   Glucose, Bld 113 (H) 70 - 99 mg/dL    Comment: Glucose reference range applies only to samples taken after fasting for at least 8 hours.   BUN 12 8 - 23 mg/dL   Creatinine, Ser 1.59 (H) 0.44 - 1.00 mg/dL   Calcium 9.6  8.9 - 10.3 mg/dL   Total Protein 7.4 6.5 - 8.1 g/dL   Albumin 4.3 3.5 - 5.0 g/dL   AST 34 15 - 41 U/L    Comment: HEMOLYSIS AT THIS LEVEL MAY AFFECT RESULT   ALT 45 (H) 0 - 44 U/L    Comment: HEMOLYSIS AT THIS LEVEL MAY AFFECT RESULT   Alkaline Phosphatase 62 38 - 126 U/L   Total Bilirubin 0.8 0.3 - 1.2 mg/dL    Comment: HEMOLYSIS AT THIS LEVEL MAY AFFECT RESULT   GFR, Estimated 32 (L) >60 mL/min    Comment: (NOTE) Calculated using the CKD-EPI Creatinine Equation (2021)    Anion gap 11 5 - 15    Comment: Performed at [facility], [place] [address] [place], [place] [address]  CBC     Status: Abnormal   Collection Time: 12/12/22  2:05 PM  Result Value Ref Range   WBC 8.9 4.0 - 10.5 K/uL   RBC 4.44 3.87 - 5.11 MIL/uL   Hemoglobin 14.3 12.0 - 15.0 g/dL   HCT 45.0 36.0 - 46.0 %   MCV 101.4 (H) 80.0 - 100.0 fL   MCH 32.2 26.0 - 34.0 pg   MCHC 31.8 30.0 - 36.0 g/dL   RDW 14.1 11.5 - 15.5 %   Platelets 219 150 - 400 K/uL   nRBC 0.0 0.0 - 0.2 %    Comment: Performed at [facility], [place] [address], [place], [place] [address]  Type and screen [place]     Status: None   Collection Time: 12/12/22  2:05 PM  Result Value Ref Range   ABO/RH(D) O POS    Antibody Screen NEG    Sample Expiration      12/15/2022,2359 Performed at [facility], [place] [address], [place], [place] [address]   Lipase, blood     Status: None   Collection Time: 12/12/22  2:05 PM  Result Value Ref Range   Lipase 32 11 - 51 U/L    Comment: Performed at [facility] [address], [place], [place] [address]  Troponin I (High Sensitivity)     Status: None   Collection Time: 12/12/22  2:05 PM  Result Value Ref Range   Troponin I (High Sensitivity) 5 <18 ng/L    Comment: (NOTE) Elevated high sensitivity troponin I (hsTnI) values and significant  changes across serial measurements may suggest ACS but many other  chronic and acute conditions are known to elevate hsTnI  results.  Refer to the "Links" section for chest pain algorithms and additional  guidance. Performed at [facility], [place] [address],  [place], [place] [address]   POC CBG, ED     Status: Abnormal   Collection Time: 12/12/22  2:45 PM  Result Value Ref Range   Glucose-Capillary 120 (H) 70 - 99 mg/dL    Comment: Glucose reference range applies only to samples taken after fasting for at least 8 hours.  CBC     Status: Abnormal   Collection Time: 12/13/22  2:50 PM  Result Value Ref Range   WBC 4.9 4.0 - 10.5 K/uL   RBC 4.33 3.87 - 5.11 MIL/uL   Hemoglobin 14.7 12.0 - 15.0 g/dL   HCT 42.7 36.0 - 46.0 %   MCV 98.6 80.0 - 100.0 fL   MCH 33.9 26.0 - 34.0 pg   MCHC 34.4 30.0 - 36.0 g/dL   RDW 14.4 11.5 - 15.5 %   Platelets  150 - 400 K/uL    PLATELET CLUMPS NOTED ON SMEAR, COUNT APPEARS DECREASED    Comment: Immature Platelet Fraction may be clinically indicated, consider ordering this additional test [id number]    nRBC 0.4 (H) 0.0 - 0.2 %    Comment: Performed at [facility], [place] [address] [place], [place] [address]   CT ANGIO GI BLEED  Result Date: 12/13/2022 CLINICAL DATA:  Hematemesis, bloody stool concern for GI bleed. EXAM: CTA ABDOMEN AND PELVIS WITHOUT AND WITH CONTRAST TECHNIQUE: Multidetector CT imaging of the abdomen and pelvis was performed using the standard protocol during bolus administration of intravenous contrast. Multiplanar reconstructed images and MIPs were obtained and reviewed to evaluate the vascular anatomy. RADIATION DOSE REDUCTION: This exam was performed according to the departmental dose-optimization program which includes automated exposure control, adjustment of the mA and/or kV according to patient size and/or use of iterative reconstruction technique. CONTRAST:  56m OMNIPAQUE IOHEXOL 350 MG/ML SOLN COMPARISON:  Multiple priors including most recent CT November 23, 2022. FINDINGS: VASCULAR Aorta: Aortic atherosclerosis. Normal caliber aorta  without aneurysm, dissection, vasculitis or significant stenosis. Right gastric artery arises directly from the aorta. Celiac: Patent without evidence of aneurysm, dissection, vasculitis or significant stenosis. SMA: Patent without evidence of aneurysm, dissection, vasculitis or significant stenosis. Renals: Left renal arteries are patent without evidence of aneurysm, dissection, vasculitis, fibromuscular dysplasia or significant stenosis. Ligation of the right renal artery with prior right nephrectomy. IMA: Patent without evidence of aneurysm, dissection, vasculitis or significant stenosis. Inflow: Patent without evidence of aneurysm, dissection, vasculitis or significant stenosis. Proximal Outflow: Bilateral common femoral and visualized portions of the superficial and profunda femoral arteries are patent without evidence of aneurysm, dissection, vasculitis or significant stenosis. Veins: No obvious venous abnormality within the limitations of this arterial phase study. Review of the MIP images confirms the above findings. NON-VASCULAR Lower chest: No acute abnormality. Hepatobiliary: There are few wedge-shaped areas of hyper enhancement of the periphery of the right lobe of the liver for instance on image 46/8 and 60/2 which equilibrate to background liver on delayed imaging and are compatible with benign transient hepatic attenuation differences/intrahepatic shunts. Gallbladder surgically absent. Similar mild prominence of the biliary tree favored reservoir effect post cholecystectomy. Pancreas: No pancreatic ductal dilation or evidence of acute inflammation. Spleen: No splenomegaly. Adrenals/Urinary Tract: Right adrenal gland is not definitely identified and may be surgically absent. Left adrenal gland appears normal. Prior right nephrectomy without suspicious nodularity in the nephrectomy bed. No left-sided hydronephrosis. Left kidney demonstrate symmetric enhancement without suspicious renal mass. Urinary  bladder is unremarkable for degree of distension. Stomach/Bowel: Stomach is minimally distended limiting evaluation. No pathologic  dilation of small or large bowel. Duodenal diverticulum. Cecum chronically located in the pelvis with noninflamed appendix. Colonic diverticulosis without findings of acute diverticulitis. No evidence of abnormal intraluminal contrast material and hollow viscus to suggest active GI bleed. Lymphatic: No pathologically enlarged abdominal or pelvic lymph nodes. Reproductive: Status post hysterectomy. No adnexal masses. Other: Pneumoperitoneum. No abdominopelvic ascites. Postsurgical change in the ventral abdominal wall. Musculoskeletal: Advanced multilevel degenerative changes spine with chronic spondylolisthesis of L3-L4 through L5-S1. Flowing endplate osteophytes with multilevel associated interbody ankylosis. IMPRESSION: 1. No evidence of active gastrointestinal bleed or acute bowel inflammation. 2. Colonic diverticulosis without findings of acute diverticulitis. 3. Prior right nephrectomy without suspicious nodularity in the nephrectomy bed. 4. Wedge-shaped areas of arterial hyperenhancement in the peripheral right lobe of the liver equilibrate to background liver on delayed imaging and are most compatible with a benign intrahepatic shunts/transient hepatic attenuation differences. 5.  Aortic Atherosclerosis (ICD10-I70.0). Electronically Signed   By: Dahlia Bailiff M.D.   On: 12/13/2022 16:02    Pending Labs Unresulted Labs (From admission, onward)     Start     Ordered   12/14/22 7425  Basic metabolic panel  Tomorrow morning,   R        12/13/22 1728   12/14/22 0500  CBC  Tomorrow morning,   R        12/13/22 1728   12/13/22 1729  Hemoglobin A1c  (Glycemic Control (SSI)  Q 4 Hours / Glycemic Control (SSI)  AC +/- HS)  Once,   R       Comments: To assess prior glycemic control    12/13/22 1728            Vitals/Pain Today's Vitals   12/13/22 1530 12/13/22 1600  12/13/22 1630 12/13/22 1722  BP: 121/76 114/78 108/62   Pulse:      Resp:      Temp:    98.7 F (37.1 C)  TempSrc:    Oral  SpO2:      Weight:      Height:      PainSc:        Isolation Precautions No active isolations  Medications Medications  pantoprazole (PROTONIX) EC tablet 40 mg (has no administration in time range)  allopurinol (ZYLOPRIM) tablet 100 mg (has no administration in time range)  amLODipine (NORVASC) tablet 10 mg (has no administration in time range)  metoprolol tartrate (LOPRESSOR) tablet 100 mg (has no administration in time range)  QUEtiapine (SEROQUEL) tablet 25 mg (has no administration in time range)  dicyclomine (BENTYL) capsule 10 mg (has no administration in time range)  hydrocortisone (ANUSOL-HC) suppository 25 mg (has no administration in time range)  fluconazole (DIFLUCAN) IVPB 100 mg (has no administration in time range)  fluconazole (DIFLUCAN) tablet 100 mg (has no administration in time range)  lactated ringers infusion (has no administration in time range)  acetaminophen (TYLENOL) tablet 650 mg (has no administration in time range)    Or  acetaminophen (TYLENOL) suppository 650 mg (has no administration in time range)  morphine (PF) 2 MG/ML injection 2 mg (has no administration in time range)  ondansetron (ZOFRAN) tablet 4 mg (has no administration in time range)    Or  ondansetron (ZOFRAN) injection 4 mg (has no administration in time range)  hydrALAZINE (APRESOLINE) injection 5 mg (has no administration in time range)  insulin aspart (novoLOG) injection 0-9 Units (has no administration in time range)  pantoprazole (PROTONIX) 80 mg /NS 100 mL IVPB (0 mg Intravenous  Stopped 12/13/22 1514)  ondansetron (ZOFRAN) injection 4 mg (4 mg Intravenous Given 12/13/22 1444)  acetaminophen (TYLENOL) tablet 1,000 mg (1,000 mg Oral Given 12/13/22 1444)  iohexol (OMNIPAQUE) 350 MG/ML injection 65 mL (65 mLs Intravenous Contrast Given 12/13/22 1506)     Mobility walks with person assist     Focused Assessments Cardiac Assessment Handoff:    Lab Results  Component Value Date   CKTOTAL 68 12/07/2017   TROPONINI <0.03 01/03/2017   Lab Results  Component Value Date   DDIMER 1.05 (H) 06/22/2019   Does the Patient currently have chest pain? No   , Pulmonary Assessment Handoff:  Lung sounds:   O2 Device: Room Air      R Recommendations: See Admitting Provider Note  Report given to:   Additional Notes:

## 2022-12-13 NOTE — H&P (Signed)
History and Physical    [name] TGG:[id number] DOB: 1936/07/06 DOA: 12/12/2022 DOS: the patient was seen and examined on 12/13/2022 PCP: Nolene Ebbs, MD  Patient coming from: Home - lives alone; NOK:  Charolotte Eke (caregiver), [phone]; Daughter, Wolfgang Phoenix, [phone]    Chief Complaint: GI bleeding  HPI: Sherri Burns is a 87 y.o. female with medical history significant of chronic diastolic CHF, DM, HLD, ischemic colitis, HTN, and hypothyroidism presenting with rectal bleeding and also hematemesis.  She was seen at Urgent Care for this issue yesterday and was encouraged to go to the ER - she has been in the waiting room for almost 24 hours.   She reports GI bleeding from above and below, yesterday was doing both.  She has had blood and mucus, this is the third time it has happened and has a "rash in my mouth."  She has been taking medication for this and it wasn't helping.  She has this when she gets nauseated.  It happened for the first time last month and has happened 3 times since, no scope.  The bleeding from below is likely related to hemorrhoids, she thinks.  Acting up for 4 years, protruding when she has a BM.  She has been having blood that runs out after a BM and runs into the commode.  She has been feeling light-headed, dizzy, SOB and has severe pain with BMs.  She has not been to surgery but she thinks [place] is going to fix her hemorrhoids.    ER Course:  UGI bleed, GI to scope tomorrow.  Abdominal pain too.  28 hours in ER.  CT without anything obvious.  None further bleeding in ER?     Review of Systems: As mentioned in the history of present illness. All other systems reviewed and are negative. Past Medical History:  Diagnosis Date   Abnormal EKG 02/07/2016   Inferolateral T wave inversion and ST depression.   Acute calculous cholecystitis    Anxiety    Arthritis    Chronic diastolic CHF (congestive heart failure) ([place]) 02/21/2018    Colon polyps 11/2008   tubular adenoma   Diabetes mellitus    Esophageal problem    esophageal dilation   GERD (gastroesophageal reflux disease)    + hpylori   GI bleeding 01/2018   Headache    Hyperlipidemia    Hypertension    Hypothyroidism    Ischemic colitis [facility])    Renal cancer, right ([place]) 2010   s/p Rt nephrectomy by Dr. Rosana Hoes   Renal insufficiency    Past Surgical History:  Procedure Laterality Date   ABDOMINAL HYSTERECTOMY  1978   partial   CATARACT EXTRACTION     CHOLECYSTECTOMY N/A 04/04/2016   Procedure: LAPAROSCOPIC CHOLECYSTECTOMY;  Surgeon: Autumn Messing III, MD;  Location: [facility];  Service: General;  Laterality: N/A;   COLONOSCOPY     ESOPHAGEAL DILATION  2016   HERNIA REPAIR     LAPAROSCOPIC CHOLECYSTECTOMY  04/04/2016   NEPHRECTOMY Right 1982   Dr. Rosana Hoes, urology, due to renal cancer   Social History:  reports that she quit smoking about 12 months ago. Her smoking use included cigarettes. She has a 27.50 pack-year smoking history. She has never used smokeless tobacco. She reports that she does not drink alcohol and does not use drugs.  Allergies  Allergen Reactions   Ace Inhibitors Swelling    See 02/16/19    Codeine Other (See Comments)    Hallucinations  Januvia [Sitagliptin] Diarrhea   Metformin And Related Other (See Comments)    GI upset   Tessalon Perles [Benzonatate] Other (See Comments)    GI upset   Ciprocin-Fluocin-Procin [Fluocinolone Acetonide] Other (See Comments)    Unknown reaction   Clonidine Derivatives Other (See Comments)    Dry mouth   Crestor [Rosuvastatin Calcium] Other (See Comments)    Myalgias    Penicillins Other (See Comments)    Hallucinations    Zocor [Simvastatin] Other (See Comments)    GI upset     Family History  Problem Relation Age of Onset   Cancer Mother        colon, kidney cancer   Cancer Father        throat cancer   Suicidality Son    Cancer Sister    Heart attack Brother     Prior to  Admission medications   Medication Sig Start Date End Date Taking? Authorizing Provider  allopurinol (ZYLOPRIM) 100 MG tablet Take 1 tablet (100 mg total) by mouth daily. 11/11/20   Trula Slade, DPM  amLODipine (NORVASC) 5 MG tablet Take 1 tablet (5 mg total) by mouth daily. Patient taking differently: Take 10 mg by mouth daily. 12/06/20   Just, Laurita Quint, FNP  cholecalciferol (VITAMIN D3) 25 MCG (1000 UNIT) tablet Take 1,000 Units by mouth daily.    [provider]  dicyclomine (BENTYL) 10 MG capsule TAKE ONE CAPSULE BY MOUTH TWICE A DAY Patient taking differently: Take 10 mg by mouth in the morning and at bedtime. 01/31/22   Zehr, Laban Emperor, PA-C  ezetimibe (ZETIA) 10 MG tablet Take 10 mg by mouth daily. 05/25/22   [provider]  furosemide (LASIX) 40 MG tablet Take 0.5 tablets (20 mg total) by mouth daily. Patient taking differently: Take 40 mg by mouth daily. 08/08/20   Jacelyn Pi, Lilia Argue, MD  glipiZIDE (GLUCOTROL) 10 MG tablet Take 20 mg by mouth 2 (two) times daily before a meal.    [provider]  hyoscyamine (LEVBID) 0.375 MG 12 hr tablet Take 0.375 mg by mouth every 12 (twelve) hours as needed. 08/07/22   [provider]  LINZESS 72 MCG capsule Take 72 mcg by mouth every morning. 11/27/22   [provider]  metoprolol tartrate (LOPRESSOR) 100 MG tablet Take 1 tablet (100 mg total) by mouth 2 (two) times daily. 06/16/20   Jacelyn Pi, Lilia Argue, MD  omeprazole (PRILOSEC) 20 MG capsule Take 1 capsule (20 mg total) by mouth daily. 05/26/22   Palumbo, April, MD  ondansetron (ZOFRAN) 4 MG tablet Take 1 tablet (4 mg total) by mouth every 6 (six) hours. 11/23/22   Sherrye Payor A, PA-C  QUEtiapine (SEROQUEL) 25 MG tablet Take 25 mg by mouth at bedtime. 05/25/22   [provider]  vitamin E 100 UNIT capsule Take 100 Units by mouth daily.    [provider]    Physical Exam: Vitals:   12/13/22 1600 12/13/22 1630 12/13/22 1700  12/13/22 1722  BP: 114/78 108/62 106/79   Pulse:      Resp:      Temp:    98.7 F (37.1 C)  TempSrc:    Oral  SpO2:      Weight:      Height:       General:  Appears calm and comfortable and is in NAD Eyes:  PERRL, EOMI, normal lids, iris ENT:  grossly normal hearing, lips; mild whitish coat on tongue, ?thrush, mmm;  edentulous Neck:  no LAD, masses or thyromegaly Cardiovascular:  RRR, no m/r/g. No LE edema.  Respiratory:   CTA bilaterally with no wheezes/rales/rhonchi.  Normal respiratory effort. Abdomen:  soft, mildly diffusely TTP but worse in midepigastric region, ND Skin:  no rash or induration seen on limited exam Musculoskeletal:  grossly normal tone BUE/BLE, good ROM, no bony abnormality Psychiatric:  blunted mood and affect, speech fluent and appropriate, AOx3 but ?MCI, poor historian Neurologic:  CN 2-12 grossly intact, moves all extremities in coordinated fashion   Radiological Exams on Admission: Independently reviewed - see discussion in A/P where applicable  CT ANGIO GI BLEED  Result Date: 12/13/2022 CLINICAL DATA:  Hematemesis, bloody stool concern for GI bleed. EXAM: CTA ABDOMEN AND PELVIS WITHOUT AND WITH CONTRAST TECHNIQUE: Multidetector CT imaging of the abdomen and pelvis was performed using the standard protocol during bolus administration of intravenous contrast. Multiplanar reconstructed images and MIPs were obtained and reviewed to evaluate the vascular anatomy. RADIATION DOSE REDUCTION: This exam was performed according to the departmental dose-optimization program which includes automated exposure control, adjustment of the mA and/or kV according to patient size and/or use of iterative reconstruction technique. CONTRAST:  43m OMNIPAQUE IOHEXOL 350 MG/ML SOLN COMPARISON:  Multiple priors including most recent CT November 23, 2022. FINDINGS: VASCULAR Aorta: Aortic atherosclerosis. Normal caliber aorta without aneurysm, dissection, vasculitis or significant  stenosis. Right gastric artery arises directly from the aorta. Celiac: Patent without evidence of aneurysm, dissection, vasculitis or significant stenosis. SMA: Patent without evidence of aneurysm, dissection, vasculitis or significant stenosis. Renals: Left renal arteries are patent without evidence of aneurysm, dissection, vasculitis, fibromuscular dysplasia or significant stenosis. Ligation of the right renal artery with prior right nephrectomy. IMA: Patent without evidence of aneurysm, dissection, vasculitis or significant stenosis. Inflow: Patent without evidence of aneurysm, dissection, vasculitis or significant stenosis. Proximal Outflow: Bilateral common femoral and visualized portions of the superficial and profunda femoral arteries are patent without evidence of aneurysm, dissection, vasculitis or significant stenosis. Veins: No obvious venous abnormality within the limitations of this arterial phase study. Review of the MIP images confirms the above findings. NON-VASCULAR Lower chest: No acute abnormality. Hepatobiliary: There are few wedge-shaped areas of hyper enhancement of the periphery of the right lobe of the liver for instance on image 46/8 and 60/2 which equilibrate to background liver on delayed imaging and are compatible with benign transient hepatic attenuation differences/intrahepatic shunts. Gallbladder surgically absent. Similar mild prominence of the biliary tree favored reservoir effect post cholecystectomy. Pancreas: No pancreatic ductal dilation or evidence of acute inflammation. Spleen: No splenomegaly. Adrenals/Urinary Tract: Right adrenal gland is not definitely identified and may be surgically absent. Left adrenal gland appears normal. Prior right nephrectomy without suspicious nodularity in the nephrectomy bed. No left-sided hydronephrosis. Left kidney demonstrate symmetric enhancement without suspicious renal mass. Urinary bladder is unremarkable for degree of distension.  Stomach/Bowel: Stomach is minimally distended limiting evaluation. No pathologic dilation of small or large bowel. Duodenal diverticulum. Cecum chronically located in the pelvis with noninflamed appendix. Colonic diverticulosis without findings of acute diverticulitis. No evidence of abnormal intraluminal contrast material and hollow viscus to suggest active GI bleed. Lymphatic: No pathologically enlarged abdominal or pelvic lymph nodes. Reproductive: Status post hysterectomy. No adnexal masses. Other: Pneumoperitoneum. No abdominopelvic ascites. Postsurgical change in the ventral abdominal wall. Musculoskeletal: Advanced multilevel degenerative changes spine with chronic spondylolisthesis of L3-L4 through L5-S1. Flowing endplate osteophytes with multilevel associated interbody ankylosis. IMPRESSION: 1. No evidence of active gastrointestinal bleed or acute bowel inflammation.  2. Colonic diverticulosis without findings of acute diverticulitis. 3. Prior right nephrectomy without suspicious nodularity in the nephrectomy bed. 4. Wedge-shaped areas of arterial hyperenhancement in the peripheral right lobe of the liver equilibrate to background liver on delayed imaging and are most compatible with a benign intrahepatic shunts/transient hepatic attenuation differences. 5.  Aortic Atherosclerosis (ICD10-I70.0). Electronically Signed   By: Dahlia Bailiff M.D.   On: 12/13/2022 16:02    EKG: Independently reviewed.  NSR with rate 81; nonspecific ST changes with no evidence of acute ischemia   Labs on Admission: I have personally reviewed the available labs and imaging studies at the time of the admission.  Pertinent labs:    Glucose 113 BUN 12/Creatinine 1.59/GFR 32 - stable HS troponin 5 Normal CBC, Hgb stable yesterday and today   Assessment and Plan: Principal Problem:   Acute upper GI bleeding Active Problems:   Hypothyroidism   CKD (chronic kidney disease), stage III (HCC)   Type 2 diabetes mellitus  with diabetic polyneuropathy, without long-term current use of insulin (HCC)   Hyperlipidemia   Chronic diastolic CHF (congestive heart failure) (HCC)   Hemorrhoids, complicated   Thrush of mouth and esophagus ([place])    UGI bleeding -Patient is presenting with hematemesis, suggestive of upper GI bleeding. -She reports mouth pain and a tongue coating, suggestive of candidiasis; however, she says that she has been taking medication for this and it isn't working -She reports about a month of symptoms and 4 total episodes including yesterday -Most likely diagnosis is candidal esophagitis or gastritis, although a gastric or esophageal ulcer is also a consideration. -The patient is not tachycardic with normal blood pressure, suggesting subacute volume loss.  -Will observe on med surg -GI consulted by ED, will follow up recommendations -NPO for possible EGD -LR at 75 mL/hr -Since patient appears to be relatively stable at this time, will give a single IV dose of PPI and then switch to oral 40 mg PO BID PPI -Will also give a one-time dose of IV fluconazole and then switch to PO -Zofran IV for nausea -Avoid NSAIDs and SQ heparin -Type and screen done -Monitor closely and follow cbc daily (given no significant change from yesterday to today and no further obvious bleeding).  Hemorrhoidal bleeding -Also with lower GI bleeding that by description sounds hemorrhoidal -She has been seen for this at [facility] on 11/14 with plan for medical treatment and possible need for hemorrhoidectomy if this fails -Will need outpatient f/u for this issue -Continue Anusol but change to BID standing rather than PRN  DM -Will check A1c -hold glipizide -Cover with sensitive-scale SSI   HTN  -Continue amlodipine, metoprolol  HLD -Hold Zetia due to limited inpatient utility  Hypothyroidism -Does not appear to be taking medications at this time -Recent normal thyroid testing  Stage 3b CKD -Appears to be stable  at this time -Attempt to avoid nephrotoxic medications -Recheck BMP in AM   Chronic diastolic CHF -Appears to be compensated -Hold Lasix -Will follow  Mood d/o -?MCI - her daughter has noticed some memory problems -Continue Seroquel qhs -Will add delirium precautions     Advance Care Planning:   Code Status: Full Code - Code status was discussed with the patient and/or family at the time of admission.  The patient would want to receive full resuscitative measures at this time.   Consults: GI  DVT Prophylaxis: SCDs  Family Communication: None present; I spoke with her daughter by telephone at the time of admission  Severity of Illness: The appropriate patient status for this patient is OBSERVATION. Observation status is judged to be reasonable and necessary in order to provide the required intensity of service to ensure the patient's safety. The patient's presenting symptoms, physical exam findings, and initial radiographic and laboratory data in the context of their medical condition is felt to place them at decreased risk for further clinical deterioration. Furthermore, it is anticipated that the patient will be medically stable for discharge from the hospital within 2 midnights of admission.   Author: Karmen Bongo, MD 12/13/2022 5:38 PM  For on call review [email or web address].

## 2022-12-13 NOTE — ED Notes (Signed)
Second RN attempted PIV and was unsuccessful. Will place order for IV team.

## 2022-12-13 NOTE — Consult Note (Signed)
Consultation Note   Referring Provider:  [facility] PCP: Nolene Ebbs, MD Primary Gastroenterologist: Previously Thornton Park, MD but now followed by [facility]  Reason for consultation: hematemesis and hematochezia   Hospital Day: 2  Assessment    Patient profile:  Sherri Burns is a 87 y.o. female with a past medical history significant for GERD,  gland polyps, colon polyps, diverticulosis, ischemic colitis, hemorrhoids, chronic constipation, renal cancer s/p R nephrectomy, hypothyroidism, DM . See PMH for any additional medical problems.   # 87 yo female with chronic, intermittent rectal bleeding with bowel movements / hemorrhoids. Has been seen by two colorectal surgeons within the last few months. Most recent was with Dr. Drue Flirt and hemorrhoidectomy was discussed.  Steroid cream not helping.  # Hematemesis. Reports bloody emesis last week and  x4 yesterday. Seems low volume. BUN and hgb are normal.  Maybe erosive esophagitis?    # Large duodenal diverticulum  # History of colon polyps. Last colonoscopy Aug 2020 she had adenomatous colon polyps but no recall planned due to age  # See PMH for additional medical problems  Plan   Her hgb is stable. She needs to follow up with Dr. Drue Flirt and let he know there hasn't been improvement in hemorrhoid bleeding with miralax and Anusol. Inflamed hemorrhoids on exam today but no bleeding.   Continue BID PPI May need EGD this admission. If not today then I will place orders for a diet. NPO for now.    HPI   Sherri Burns used to be followed by our practice but now followed by [facility]. They have been seeing her for constipation, abdominal pain, prolapsing hemorrhoids. At her last visit there in 09/17/22 an EGD / colonoscopy and CTA abd  was recommended but not done. She was referred to colorectal surgery and saw Dr. Drue Flirt in November for treatment of hemorrhoids.  Hemorrhoidectomy discussed if trial of anusol didn't help. Of note patient had seen Dr. Marcello Moores ( [facility]) for hemorrhoids just a few months prior.   Patient seen in ED 12/29 for constipation/ abdominal pain .  CT w contrast was negative for anything acute. Her labs were unremarkable except for elevated serum calcium  Interval History:   Sherri Burns comes back to [facility] ED for abdominal pain, complaints of dark red bloody emesis and rectal bleeding. She has been weak, dizzy. In ED she has been hypertensive. Her labs are remarkable for a normal hgb normal at 14.3. Creatinine 1.5, ALT 45.  She has chronic, intermittent hemorrhoidal bleeding and anusol hasn't helped. Last week and again yesterday she had dark red emesis. Yesterday she had 4 episodes and there was dark red blood each time. On a daily PPI at home. No NSAIDs   Previous GI Evaluation     Most recent procedures:  EGD Aug 2020  Colonoscopy Aug 2020  Surgical [P], gastric mucosa biopsy - MILD CHRONIC GASTRITIS AND PROTON PUMP INHIBITOR EFFECT. - WARTHIN-STARRY IS NEGATIVE FOR HELICOBACTER PYLORI. - NO INTESTINAL METAPLASIA, DYSPLASIA, OR MALIGNANCY. 2. Surgical [P], gastric polyp BX - REACTIVE GASTROPATHY, SEE COMMENT. - WARTHIN-STARRY IS NEGATIVE FOR HELICOBACTER PYLORI. - NO INTESTINAL METAPLASIA, DYSPLASIA, OR MALIGNANCY. 3. Surgical [P], small bowel, ileocecal valve, polyp - BENIGN SMALL BOWEL MUCOSA  WITH LYMPHOID AGGREGATE. - NO DYSPLASIA OR MALIGNANCY. 4. Surgical [P], colon, descending, transverse, and hepatic flexure, polyp (9) - TUBULAR ADENOMA (X4 FRAGMENTS). - NO HIGH GRADE DYSPLASIA OR MALIGNANCY.    Recent Labs and Imaging CT ABDOMEN PELVIS W CONTRAST  Result Date: 11/23/2022 CLINICAL DATA:  87 year old female with history of rectal cancer presenting with constipation. Possible GI bleed. * Tracking Code: BO * EXAM: CT ABDOMEN AND PELVIS WITH CONTRAST TECHNIQUE: Multidetector CT imaging of the abdomen and  pelvis was performed using the standard protocol following bolus administration of intravenous contrast. RADIATION DOSE REDUCTION: This exam was performed according to the departmental dose-optimization program which includes automated exposure control, adjustment of the mA and/or kV according to patient size and/or use of iterative reconstruction technique. CONTRAST:  77m OMNIPAQUE IOHEXOL 300 MG/ML  SOLN COMPARISON:  CT of the abdomen and pelvis 06/19/2022. FINDINGS: Lower chest: Unremarkable. Hepatobiliary: No suspicious cystic or solid hepatic lesions. No intra or extrahepatic biliary ductal dilatation. Status post cholecystectomy. Pancreas: No pancreatic mass. No pancreatic ductal dilatation. No pancreatic or peripancreatic fluid collections or inflammatory changes. Spleen: Unremarkable. Adrenals/Urinary Tract: Status post right radical nephrectomy. No unexpected soft tissue mass in the nephrectomy bed to suggest locally recurrent disease. Right adrenal gland is not confidently identified may also be surgically absent. Left kidney and left adrenal gland are normal in appearance. No left hydroureteronephrosis. Urinary bladder is unremarkable in appearance. Stomach/Bowel: The appearance of the stomach is normal. Large duodenal diverticulum extending from the third portion of the duodenum, without surrounding inflammatory changes. There is no pathologic dilatation of small bowel or colon. Scattered colonic diverticula are noted, without surrounding inflammatory changes to indicate an acute diverticulitis at this time. Normal appendix. Vascular/Lymphatic: Atherosclerosis throughout the abdominal aorta and pelvic vasculature. No lymphadenopathy noted in the abdomen or pelvis. Reproductive: Status post hysterectomy. Ovaries are not confidently identified may be surgically absent or atrophic. Other: No significant volume of ascites.  No pneumoperitoneum. Musculoskeletal: There are no aggressive appearing lytic or  blastic lesions noted in the visualized portions of the skeleton. IMPRESSION: 1. No acute findings are noted in the abdomen or pelvis to account for the patient's symptoms. 2. Colonic diverticulosis without evidence of acute diverticulitis at this time. 3. Aortic atherosclerosis. 4. No definite evidence of recurrent or metastatic disease in the abdomen or pelvis. 5. Additional incidental findings, similar to prior studies, as above. Electronically Signed   By: DVinnie LangtonM.D.   On: 11/23/2022 06:44   DG Abdomen 1 View  Result Date: 11/23/2022 CLINICAL DATA:  87year old female with history of epigastric pain, bloating and constipation. EXAM: ABDOMEN - 1 VIEW COMPARISON:  Abdominal radiograph 06/20/2021. FINDINGS: Gas and stool are seen scattered throughout the colon extending to the level of the distal rectum. No pathologic distension of small bowel is noted. Large volume of stool throughout the colon and rectum. No gross evidence of pneumoperitoneum. Surgical clips project over the right upper quadrant of the abdomen, likely from prior cholecystectomy. IMPRESSION: 1. Nonobstructive bowel gas pattern. 2. No pneumoperitoneum. 3. Large volume of stool throughout the colon and rectum, compatible with reported clinical history of constipation. Electronically Signed   By: DVinnie LangtonM.D.   On: 11/23/2022 05:09    Labs:  Recent Labs    12/12/22 1405  WBC 8.9  HGB 14.3  HCT 45.0  PLT 219   Recent Labs    12/12/22 1405  NA 137  K 4.7  CL 100  CO2 26  GLUCOSE 113*  BUN 12  CREATININE 1.59*  CALCIUM 9.6   Recent Labs    12/12/22 1405  PROT 7.4  ALBUMIN 4.3  AST 34  ALT 45*  ALKPHOS 62  BILITOT 0.8   No results for input(s): "HEPBSAG", "HCVAB", "HEPAIGM", "HEPBIGM" in the last 72 hours. No results for input(s): "LABPROT", "INR" in the last 72 hours.  Past Medical History:  Diagnosis Date   Abnormal EKG 02/07/2016   Inferolateral T wave inversion and ST depression.   Acute  calculous cholecystitis    Anxiety    Arthritis    Chronic diastolic CHF (congestive heart failure) ([place]) 02/21/2018   Colon polyps 11/2008   tubular adenoma   Diabetes mellitus    Esophageal problem    esophageal dilation   GERD (gastroesophageal reflux disease)    + hpylori   GI bleeding 01/2018   Headache    Hyperlipidemia    Hypertension    Hypothyroidism    Ischemic colitis [facility])    Renal cancer, right ([place]) 2010   s/p Rt nephrectomy by Dr. Rosana Hoes   Renal insufficiency     Past Surgical History:  Procedure Laterality Date   ABDOMINAL HYSTERECTOMY  1978   partial   CATARACT EXTRACTION     CHOLECYSTECTOMY N/A 04/04/2016   Procedure: LAPAROSCOPIC CHOLECYSTECTOMY;  Surgeon: Autumn Messing III, MD;  Location: [facility];  Service: General;  Laterality: N/A;   COLONOSCOPY     ESOPHAGEAL DILATION  2016   HERNIA REPAIR     LAPAROSCOPIC CHOLECYSTECTOMY  04/04/2016   NEPHRECTOMY Right 1982   Dr. Rosana Hoes, urology, due to renal cancer    Family History  Problem Relation Age of Onset   Cancer Mother        colon, kidney cancer   Cancer Father        throat cancer   Suicidality Son    Cancer Sister    Heart attack Brother     Prior to Admission medications   Medication Sig Start Date End Date Taking? Authorizing Provider  allopurinol (ZYLOPRIM) 100 MG tablet Take 1 tablet (100 mg total) by mouth daily. 11/11/20   Trula Slade, DPM  amLODipine (NORVASC) 5 MG tablet Take 1 tablet (5 mg total) by mouth daily. Patient taking differently: Take 10 mg by mouth daily. 12/06/20   Just, Laurita Quint, FNP  cholecalciferol (VITAMIN D3) 25 MCG (1000 UNIT) tablet Take 1,000 Units by mouth daily.    [provider]  dicyclomine (BENTYL) 10 MG capsule TAKE ONE CAPSULE BY MOUTH TWICE A DAY Patient taking differently: Take 10 mg by mouth in the morning and at bedtime. 01/31/22   Zehr, Laban Emperor, PA-C  ezetimibe (ZETIA) 10 MG tablet Take 10 mg by mouth daily. 05/25/22   [provider]   furosemide (LASIX) 40 MG tablet Take 0.5 tablets (20 mg total) by mouth daily. Patient taking differently: Take 40 mg by mouth daily. 08/08/20   Jacelyn Pi, Lilia Argue, MD  glipiZIDE (GLUCOTROL) 10 MG tablet Take 20 mg by mouth 2 (two) times daily before a meal.    [provider]  hyoscyamine (LEVBID) 0.375 MG 12 hr tablet Take 0.375 mg by mouth every 12 (twelve) hours as needed. 08/07/22   [provider]  LINZESS 72 MCG capsule Take 72 mcg by mouth every morning. 11/27/22   [provider]  metoprolol tartrate (LOPRESSOR) 100 MG tablet Take 1 tablet (100 mg total) by mouth 2 (two) times daily. 06/16/20   Jacelyn Pi, Benay Spice  M, MD  omeprazole (PRILOSEC) 20 MG capsule Take 1 capsule (20 mg total) by mouth daily. 05/26/22   Palumbo, April, MD  ondansetron (ZOFRAN) 4 MG tablet Take 1 tablet (4 mg total) by mouth every 6 (six) hours. 11/23/22   Sherrye Payor A, PA-C  QUEtiapine (SEROQUEL) 25 MG tablet Take 25 mg by mouth at bedtime. 05/25/22   [provider]  vitamin E 100 UNIT capsule Take 100 Units by mouth daily.    [provider]    Current Facility-Administered Medications  Medication Dose Route Frequency Provider Last Rate Last Admin   acetaminophen (TYLENOL) tablet 1,000 mg  1,000 mg Oral STAT Fransico Meadow, MD       ondansetron [facility]) injection 4 mg  4 mg Intravenous Once Fransico Meadow, MD       pantoprazole (PROTONIX) 80 mg /NS 100 mL IVPB  80 mg Intravenous Once Fransico Meadow, MD       [START ON 12/16/2022] pantoprazole (PROTONIX) injection 40 mg  40 mg Intravenous Q12H Fransico Meadow, MD       Current Outpatient Medications  Medication Sig Dispense Refill   allopurinol (ZYLOPRIM) 100 MG tablet Take 1 tablet (100 mg total) by mouth daily. 30 tablet 0   amLODipine (NORVASC) 5 MG tablet Take 1 tablet (5 mg total) by mouth daily. (Patient taking differently: Take 10 mg by mouth daily.) 90 tablet 3   cholecalciferol (VITAMIN D3)  25 MCG (1000 UNIT) tablet Take 1,000 Units by mouth daily.     dicyclomine (BENTYL) 10 MG capsule TAKE ONE CAPSULE BY MOUTH TWICE A DAY (Patient taking differently: Take 10 mg by mouth in the morning and at bedtime.) 60 capsule 4   ezetimibe (ZETIA) 10 MG tablet Take 10 mg by mouth daily.     furosemide (LASIX) 40 MG tablet Take 0.5 tablets (20 mg total) by mouth daily. (Patient taking differently: Take 40 mg by mouth daily.) 90 tablet 0   glipiZIDE (GLUCOTROL) 10 MG tablet Take 20 mg by mouth 2 (two) times daily before a meal.     hyoscyamine (LEVBID) 0.375 MG 12 hr tablet Take 0.375 mg by mouth every 12 (twelve) hours as needed.     LINZESS 72 MCG capsule Take 72 mcg by mouth every morning.     metoprolol tartrate (LOPRESSOR) 100 MG tablet Take 1 tablet (100 mg total) by mouth 2 (two) times daily. 180 tablet 1   omeprazole (PRILOSEC) 20 MG capsule Take 1 capsule (20 mg total) by mouth daily. 30 capsule 0   ondansetron (ZOFRAN) 4 MG tablet Take 1 tablet (4 mg total) by mouth every 6 (six) hours. 12 tablet 0   QUEtiapine (SEROQUEL) 25 MG tablet Take 25 mg by mouth at bedtime.     vitamin E 100 UNIT capsule Take 100 Units by mouth daily.      Allergies as of 12/12/2022 - Review Complete 12/12/2022  Allergen Reaction Noted   Ace inhibitors Swelling 02/16/2019   Codeine Other (See Comments) 01/17/2012   Januvia [sitagliptin] Diarrhea 09/13/2020   Metformin and related  02/20/2018   Ciprocin-fluocin-procin [fluocinolone acetonide] Other (See Comments) 01/17/2012   Clonidine derivatives Other (See Comments) 01/17/2012   Crestor [rosuvastatin calcium] Other (See Comments) 07/28/2015   Penicillins Other (See Comments) 01/17/2012   Tessalon perles Other (See Comments) 01/17/2012   Zocor [simvastatin] Other (See Comments) 04/21/2013    Social History   Socioeconomic History   Marital status: Widowed    Spouse name:  Not on file   Number of children: 2   Years of education: Not on file    Highest education level: Not on file  Occupational History   Not on file  Tobacco Use   Smoking status: Some Days    Packs/day: 0.25    Years: 55.00    Total pack years: 13.75    Types: Cigarettes   Smokeless tobacco: Never   Tobacco comments:    cut back amount still  trying to quit  Vaping Use   Vaping Use: Never used  Substance and Sexual Activity   Alcohol use: No    Alcohol/week: 0.0 standard drinks of alcohol   Drug use: No   Sexual activity: Not Currently  Other Topics Concern   Not on file  Social History Narrative   Loss of son.   Social Determinants of Health   Financial Resource Strain: Not on file  Food Insecurity: Not on file  Transportation Needs: Not on file  Physical Activity: Not on file  Stress: Not on file  Social Connections: Not on file  Intimate Partner Violence: Not on file    Review of Systems: All systems reviewed and negative except where noted in HPI.  Physical Exam: Vital signs in last 24 hours: Temp:  [97.9 F (36.6 C)-99.8 F (37.7 C)] 98.1 F (36.7 C) (01/18 0836) Pulse Rate:  [74-93] 88 (01/18 1210) Resp:  [16-20] 16 (01/18 1210) BP: (115-155)/(73-106) 136/91 (01/18 1210) SpO2:  [97 %-100 %] 97 % (01/18 1210) Weight:  [73.9 kg] 73.9 kg (01/17 1356)    General:  Alert female in NAD Psych:  Pleasant, cooperative. Normal mood and affect Eyes: Pupils equal Ears:  Normal auditory acuity Nose: No deformity, discharge or lesions Neck:  Supple, no masses felt Lungs:  Clear to auscultation.  Heart:  Regular rate, regular rhythm.  Abdomen:  Soft, nondistended, nontender, active bowel sounds, no masses felt Rectal :  Inflamed prolapsing hemorrhoids. No stool or blood on DRE  Msk: Symmetrical without gross deformities.  Neurologic:  Alert, oriented, grossly normal neurologically Extremities : No edema Skin:  Intact without significant lesions.    Intake/Output from previous day: No intake/output data recorded. Intake/Output this  shift:  No intake/output data recorded.    Active Problems:   * No active hospital problems. Tye Savoy, NP-C @  12/13/2022, 12:46 PM

## 2022-12-13 NOTE — ED Notes (Signed)
This RN attempted x2 for PIV placement and was unsuccessful. Will have second RN attempt.

## 2022-12-13 NOTE — ED Notes (Signed)
IV team at bedside at this time. 

## 2022-12-13 NOTE — ED Provider Notes (Signed)
[facility] Provider Note   CSN: [id number] Arrival date & time: 12/12/22  1232     History  Chief Complaint  Patient presents with   Abdominal Pain    CHRISIE JANKOVICH is a 87 y.o. female.  87 year old female with a history of gastric and colonic polyps and internal hemorrhoids who presents to the emergency department with hematochezia and hematemesis.  Reports that she started having hematemesis and hematochezia yesterday.  Had approximately 4 episodes of blood-streaked emesis.  Also with diffuse abdominal pain and distention.  Says that she has not had melena as well recently.  Does report her bowel movements have been somewhat painful.  No blood thinners.  Denies aspirin or Plavix.  Did have endoscopy and colonoscopy in 2020 with [place] with gastric and colonic polyps and internal hemorrhoids noted.  Says that she felt dizzy.  Did go to urgent care who referred her to the emergency department for additional evaluation.       Home Medications Prior to Admission medications   Medication Sig Start Date End Date Taking? Authorizing Provider  acetaminophen (TYLENOL) 500 MG tablet Take 500-1,000 mg by mouth daily as needed for headache, fever, moderate pain or mild pain.   Yes [provider]  allopurinol (ZYLOPRIM) 100 MG tablet Take 1 tablet (100 mg total) by mouth daily. Patient taking differently: Take 100 mg by mouth in the morning. 11/11/20  Yes Trula Slade, DPM  amLODipine (NORVASC) 10 MG tablet Take 10 mg by mouth in the morning.   Yes [provider]  Cholecalciferol (VITAMIN D3 SUPER STRENGTH) 50 MCG (2000 UT) CAPS Take 2,000 Units by mouth in the morning.   Yes [provider]  dicyclomine (BENTYL) 10 MG capsule TAKE ONE CAPSULE BY MOUTH TWICE A DAY Patient taking differently: Take 10 mg by mouth in the morning. 01/31/22  Yes Zehr, Laban Emperor, PA-C  ezetimibe (ZETIA) 10 MG tablet Take 10 mg by mouth in  the morning. 05/25/22  Yes [provider]  furosemide (LASIX) 40 MG tablet Take 0.5 tablets (20 mg total) by mouth daily. Patient taking differently: Take 40 mg by mouth in the morning. 08/08/20  Yes Jacelyn Pi, Irma M, MD  glipiZIDE (GLUCOTROL) 10 MG tablet Take 10 mg by mouth in the morning.   Yes [provider]  hydrocortisone (ANUSOL-HC) 25 MG suppository Place 25 mg rectally 2 (two) times daily as needed for hemorrhoids.   Yes [provider]  hydrocortisone 2.5 % cream Apply 1 Application topically 2 (two) times daily as needed (hemorrhoids).   Yes [provider]  hyoscyamine (LEVBID) 0.375 MG 12 hr tablet Take 0.375 mg by mouth 2 (two) times daily as needed for cramping. 08/07/22  Yes [provider]  LINZESS 72 MCG capsule Take 72 mcg by mouth every morning. 11/27/22  Yes [provider]  metoprolol tartrate (LOPRESSOR) 100 MG tablet Take 1 tablet (100 mg total) by mouth 2 (two) times daily. 06/16/20  Yes Jacelyn Pi, Lilia Argue, MD  nystatin (MYCOSTATIN) 100000 UNIT/ML suspension Use as directed 5 mLs in the mouth or throat 4 (four) times daily.   Yes [provider]  omeprazole (PRILOSEC) 20 MG capsule Take 1 capsule (20 mg total) by mouth daily. Patient taking differently: Take 20 mg by mouth in the morning. 05/26/22  Yes Palumbo, April, MD  ondansetron (ZOFRAN) 4 MG tablet Take 1 tablet (4 mg total) by mouth every 6 (six) hours. Patient taking differently:  Take 4 mg by mouth every 6 (six) hours as needed for nausea or vomiting. 11/23/22  Yes Beatty, Celeste A, PA-C  QUEtiapine (SEROQUEL) 25 MG tablet Take 25 mg by mouth at bedtime. 05/25/22  Yes [provider]  vitamin E 180 MG (400 UNITS) capsule Take 400 Units by mouth in the morning.   Yes [provider]      Allergies    Ace inhibitors, Codeine, Januvia [sitagliptin], Metformin and related, Tessalon perles [benzonatate], Ciprocin-fluocin-procin  [fluocinolone acetonide], Clonidine derivatives, Crestor [rosuvastatin calcium], Penicillins, and Zocor [simvastatin]    Review of Systems   Review of Systems  Physical Exam Updated Vital Signs BP 106/79   Pulse 78   Temp 98.7 F (37.1 C) (Oral)   Resp 16   Ht '5\' 5"'$  (1.651 m)   Wt 73.9 kg   SpO2 98%   BMI 27.12 kg/m  Physical Exam Vitals and nursing note reviewed.  Constitutional:      General: She is not in acute distress.    Appearance: She is well-developed.  HENT:     Head: Normocephalic and atraumatic.     Right Ear: External ear normal.     Left Ear: External ear normal.     Nose: Nose normal.  Eyes:     Extraocular Movements: Extraocular movements intact.     Conjunctiva/sclera: Conjunctivae normal.     Pupils: Pupils are equal, round, and reactive to light.  Cardiovascular:     Rate and Rhythm: Normal rate.     Heart sounds: No murmur heard. Pulmonary:     Effort: Pulmonary effort is normal.  Abdominal:     General: Abdomen is flat. There is no distension.     Palpations: Abdomen is soft. There is no mass.     Tenderness: There is abdominal tenderness (diffuse). There is no guarding.  Musculoskeletal:     Cervical back: Normal range of motion and neck supple.     Right lower leg: No edema.     Left lower leg: No edema.  Skin:    General: Skin is warm and dry.  Neurological:     Mental Status: She is alert and oriented to person, place, and time. Mental status is at baseline.  Psychiatric:        Mood and Affect: Mood normal.     ED Results / Procedures / Treatments   Labs (all labs ordered are listed, but only abnormal results are displayed) Labs Reviewed  COMPREHENSIVE METABOLIC PANEL - Abnormal; Notable for the following components:      Result Value   Glucose, Bld 113 (*)    Creatinine, Ser 1.59 (*)    ALT 45 (*)    GFR, Estimated 32 (*)    All other components within normal limits  CBC - Abnormal; Notable for the following components:   MCV  101.4 (*)    All other components within normal limits  CBC - Abnormal; Notable for the following components:   nRBC 0.4 (*)    All other components within normal limits  CBG MONITORING, ED - Abnormal; Notable for the following components:   Glucose-Capillary 120 (*)    All other components within normal limits  LIPASE, BLOOD  HEMOGLOBIN J6B  BASIC METABOLIC PANEL  CBC  TYPE AND SCREEN  TROPONIN I (HIGH SENSITIVITY)    EKG EKG Interpretation  Date/Time:  Wednesday December 12 2022 14:17:31 EST Ventricular Rate:  81 PR Interval:  200 QRS Duration: 112 QT Interval:  362  QTC Calculation: 420 R Axis:   -36 Text Interpretation: Normal sinus rhythm Left axis deviation Minimal voltage criteria for LVH, may be normal variant ( Cornell product ) Nonspecific T wave abnormality Abnormal ECG When compared with ECG of 26-May-2022 00:22, PREVIOUS ECG IS PRESENT Confirmed by Margaretmary Eddy [phone]) on 12/13/2022 11:30:03 AM  Radiology CT ANGIO GI BLEED  Result Date: 12/13/2022 CLINICAL DATA:  Hematemesis, bloody stool concern for GI bleed. EXAM: CTA ABDOMEN AND PELVIS WITHOUT AND WITH CONTRAST TECHNIQUE: Multidetector CT imaging of the abdomen and pelvis was performed using the standard protocol during bolus administration of intravenous contrast. Multiplanar reconstructed images and MIPs were obtained and reviewed to evaluate the vascular anatomy. RADIATION DOSE REDUCTION: This exam was performed according to the departmental dose-optimization program which includes automated exposure control, adjustment of the mA and/or kV according to patient size and/or use of iterative reconstruction technique. CONTRAST:  86m OMNIPAQUE IOHEXOL 350 MG/ML SOLN COMPARISON:  Multiple priors including most recent CT November 23, 2022. FINDINGS: VASCULAR Aorta: Aortic atherosclerosis. Normal caliber aorta without aneurysm, dissection, vasculitis or significant stenosis. Right gastric artery arises directly from the  aorta. Celiac: Patent without evidence of aneurysm, dissection, vasculitis or significant stenosis. SMA: Patent without evidence of aneurysm, dissection, vasculitis or significant stenosis. Renals: Left renal arteries are patent without evidence of aneurysm, dissection, vasculitis, fibromuscular dysplasia or significant stenosis. Ligation of the right renal artery with prior right nephrectomy. IMA: Patent without evidence of aneurysm, dissection, vasculitis or significant stenosis. Inflow: Patent without evidence of aneurysm, dissection, vasculitis or significant stenosis. Proximal Outflow: Bilateral common femoral and visualized portions of the superficial and profunda femoral arteries are patent without evidence of aneurysm, dissection, vasculitis or significant stenosis. Veins: No obvious venous abnormality within the limitations of this arterial phase study. Review of the MIP images confirms the above findings. NON-VASCULAR Lower chest: No acute abnormality. Hepatobiliary: There are few wedge-shaped areas of hyper enhancement of the periphery of the right lobe of the liver for instance on image 46/8 and 60/2 which equilibrate to background liver on delayed imaging and are compatible with benign transient hepatic attenuation differences/intrahepatic shunts. Gallbladder surgically absent. Similar mild prominence of the biliary tree favored reservoir effect post cholecystectomy. Pancreas: No pancreatic ductal dilation or evidence of acute inflammation. Spleen: No splenomegaly. Adrenals/Urinary Tract: Right adrenal gland is not definitely identified and may be surgically absent. Left adrenal gland appears normal. Prior right nephrectomy without suspicious nodularity in the nephrectomy bed. No left-sided hydronephrosis. Left kidney demonstrate symmetric enhancement without suspicious renal mass. Urinary bladder is unremarkable for degree of distension. Stomach/Bowel: Stomach is minimally distended limiting  evaluation. No pathologic dilation of small or large bowel. Duodenal diverticulum. Cecum chronically located in the pelvis with noninflamed appendix. Colonic diverticulosis without findings of acute diverticulitis. No evidence of abnormal intraluminal contrast material and hollow viscus to suggest active GI bleed. Lymphatic: No pathologically enlarged abdominal or pelvic lymph nodes. Reproductive: Status post hysterectomy. No adnexal masses. Other: Pneumoperitoneum. No abdominopelvic ascites. Postsurgical change in the ventral abdominal wall. Musculoskeletal: Advanced multilevel degenerative changes spine with chronic spondylolisthesis of L3-L4 through L5-S1. Flowing endplate osteophytes with multilevel associated interbody ankylosis. IMPRESSION: 1. No evidence of active gastrointestinal bleed or acute bowel inflammation. 2. Colonic diverticulosis without findings of acute diverticulitis. 3. Prior right nephrectomy without suspicious nodularity in the nephrectomy bed. 4. Wedge-shaped areas of arterial hyperenhancement in the peripheral right lobe of the liver equilibrate to background liver on delayed imaging and are most compatible with a benign intrahepatic  shunts/transient hepatic attenuation differences. 5.  Aortic Atherosclerosis (ICD10-I70.0). Electronically Signed   By: Dahlia Bailiff M.D.   On: 12/13/2022 16:02    Procedures Procedures   Medications Ordered in ED Medications  0.9 %  sodium chloride infusion (has no administration in time range)  pantoprazole (PROTONIX) EC tablet 40 mg (has no administration in time range)  allopurinol (ZYLOPRIM) tablet 100 mg (has no administration in time range)  amLODipine (NORVASC) tablet 10 mg (has no administration in time range)  metoprolol tartrate (LOPRESSOR) tablet 100 mg (has no administration in time range)  QUEtiapine (SEROQUEL) tablet 25 mg (has no administration in time range)  dicyclomine (BENTYL) capsule 10 mg (has no administration in time  range)  hydrocortisone (ANUSOL-HC) suppository 25 mg (has no administration in time range)  fluconazole (DIFLUCAN) IVPB 100 mg (has no administration in time range)  fluconazole (DIFLUCAN) tablet 100 mg (has no administration in time range)  lactated ringers infusion (has no administration in time range)  acetaminophen (TYLENOL) tablet 650 mg (has no administration in time range)    Or  acetaminophen (TYLENOL) suppository 650 mg (has no administration in time range)  morphine (PF) 2 MG/ML injection 2 mg (has no administration in time range)  ondansetron (ZOFRAN) tablet 4 mg (has no administration in time range)    Or  ondansetron (ZOFRAN) injection 4 mg (has no administration in time range)  hydrALAZINE (APRESOLINE) injection 5 mg (has no administration in time range)  insulin aspart (novoLOG) injection 0-9 Units (has no administration in time range)  pantoprazole (PROTONIX) 80 mg /NS 100 mL IVPB (0 mg Intravenous Stopped 12/13/22 1514)  ondansetron (ZOFRAN) injection 4 mg (4 mg Intravenous Given 12/13/22 1444)  acetaminophen (TYLENOL) tablet 1,000 mg (1,000 mg Oral Given 12/13/22 1444)  iohexol (OMNIPAQUE) 350 MG/ML injection 65 mL (65 mLs Intravenous Contrast Given 12/13/22 1506)    ED Course/ Medical Decision Making/ A&P Clinical Course as of 12/13/22 1757  Thu Dec 13, 2022  1156 Scant amount of stool on rectal exam.  Hemorrhoids noted.  Chaperoned by tech. [RP]  Streetman from GI aware.  Will see patient. [RP]    Clinical Course User Index [RP] Fransico Meadow, MD                            Medical Decision Making Amount and/or Complexity of Data Reviewed Labs: ordered. Radiology: ordered.  Risk OTC drugs. Prescription drug management. Decision regarding hospitalization.   LESTINE RAHE is a 87 y.o. female with comorbidities that complicate the patient evaluation including gastric and colonic polyps and hemorrhoids who presents to the emergency department  with bloody vomit and stool and abdominal pain  Initial Ddx:  Upper GI bleed, peptic ulcer, bleeding polyp, malignancy, symptomatic anemia  MDM:  The patient may be having an upper GI bleed.  Given her abdominal pain will obtain CT scan at this time to assess for location of bleeding and to ensure there are no other complications.  Feel that this is likely coming from a bleeding polyp which were not found to be malignant but could potentially have developed malignancy in the past few years since her most recent EGD and colonoscopy.  Only symptom at this time is mild dizziness no chest pain or shortness of breath that would suggest severe symptomatic anemia.  Will obtain repeat blood work since patient has been waiting in the waiting room for prolonged time to ensure that she  has not had a drop in her hemoglobin.  Plan:  Labs Lipase Troponin Hemoccult EKG CT angio GI bleed Zofran Tylenol  ED Summary/Re-evaluation:  Patient reassessed and was stable in the emergency department.  Hemoccult unable to be obtained due to small amount of stool in the rectal vault.  Feeling better after the above interventions.  CT scan did not show acute abnormality or signs of GI bleed aside from possible filling defect in the liver.  Labs were unremarkable and serial hemoglobin without drop in her hemoglobin.  Was discussed with GI who felt that she should be admitted and will have endoscopy performed.  He was then admitted to medicine for further management.  This patient presents to the ED for concern of complaints listed in HPI, this involves an extensive number of treatment options, and is a complaint that carries with it a high risk of complications and morbidity. Disposition including potential need for admission considered.   Dispo: Admit to Floor  Records reviewed Outpatient Clinic Notes The following labs were independently interpreted: CBC and show no acute abnormality I independently reviewed the  following imaging with scope of interpretation limited to determining acute life threatening conditions related to emergency care: CT Abdomen/Pelvis and agree with the radiologist interpretation with the following exceptions: None I personally reviewed and interpreted cardiac monitoring: normal sinus rhythm  I personally reviewed and interpreted the pt's EKG: see above for interpretation  I have reviewed the patients home medications and made adjustments as needed Consults: Gastroenterology Social Determinants of health:  Elderly  Final Clinical Impression(s) / ED Diagnoses Final diagnoses:  Gastrointestinal hemorrhage, unspecified gastrointestinal hemorrhage type    Rx / DC Orders ED Discharge Orders     None         Fransico Meadow, MD 12/13/22 1757

## 2022-12-14 ENCOUNTER — Observation Stay (HOSPITAL_COMMUNITY): Payer: Medicare Other | Admitting: Critical Care Medicine

## 2022-12-14 ENCOUNTER — Observation Stay (HOSPITAL_BASED_OUTPATIENT_CLINIC_OR_DEPARTMENT_OTHER): Payer: Medicare Other | Admitting: Critical Care Medicine

## 2022-12-14 ENCOUNTER — Encounter (HOSPITAL_COMMUNITY): Payer: Self-pay | Admitting: Internal Medicine

## 2022-12-14 ENCOUNTER — Encounter (HOSPITAL_COMMUNITY)
Admission: EM | Disposition: A | Discharge status: Home / Self Care | Payer: Self-pay | Source: Home / Self Care | Attending: Emergency Medicine

## 2022-12-14 DIAGNOSIS — K5710 Diverticulosis of small intestine without perforation or abscess without bleeding: Secondary | ICD-10-CM

## 2022-12-14 DIAGNOSIS — I10 Essential (primary) hypertension: Secondary | ICD-10-CM

## 2022-12-14 DIAGNOSIS — K317 Polyp of stomach and duodenum: Secondary | ICD-10-CM

## 2022-12-14 DIAGNOSIS — Z87891 Personal history of nicotine dependence: Secondary | ICD-10-CM

## 2022-12-14 DIAGNOSIS — K3189 Other diseases of stomach and duodenum: Secondary | ICD-10-CM

## 2022-12-14 DIAGNOSIS — K922 Gastrointestinal hemorrhage, unspecified: Secondary | ICD-10-CM | POA: Diagnosis not present

## 2022-12-14 DIAGNOSIS — K2970 Gastritis, unspecified, without bleeding: Secondary | ICD-10-CM

## 2022-12-14 DIAGNOSIS — K649 Unspecified hemorrhoids: Secondary | ICD-10-CM

## 2022-12-14 HISTORY — PX: BIOPSY: SHX5522

## 2022-12-14 HISTORY — PX: ESOPHAGOGASTRODUODENOSCOPY (EGD) WITH PROPOFOL: SHX5813

## 2022-12-14 LAB — CBC
HCT: 43.2 % (ref 36.0–46.0)
Hemoglobin: 13.9 g/dL (ref 12.0–15.0)
MCH: 31.9 pg (ref 26.0–34.0)
MCHC: 32.2 g/dL (ref 30.0–36.0)
MCV: 99.1 fL (ref 80.0–100.0)
Platelets: 196 10*3/uL (ref 150–400)
RBC: 4.36 MIL/uL (ref 3.87–5.11)
RDW: 14.2 % (ref 11.5–15.5)
WBC: 7.4 10*3/uL (ref 4.0–10.5)
nRBC: 0 % (ref 0.0–0.2)

## 2022-12-14 LAB — BASIC METABOLIC PANEL
Anion gap: 13 (ref 5–15)
BUN: 14 mg/dL (ref 8–23)
CO2: 29 mmol/L (ref 22–32)
Calcium: 9.8 mg/dL (ref 8.9–10.3)
Chloride: 99 mmol/L (ref 98–111)
Creatinine, Ser: 1.72 mg/dL — ABNORMAL HIGH (ref 0.44–1.00)
GFR, Estimated: 29 mL/min — ABNORMAL LOW (ref >60)
Glucose, Bld: 95 mg/dL (ref 70–99)
Potassium: 3.9 mmol/L (ref 3.5–5.1)
Sodium: 141 mmol/L (ref 135–145)

## 2022-12-14 LAB — GLUCOSE, CAPILLARY
Glucose-Capillary: 93 mg/dL (ref 70–99)
Glucose-Capillary: 91 mg/dL (ref 70–99)
Glucose-Capillary: 89 mg/dL (ref 70–99)
Glucose-Capillary: 66 mg/dL — ABNORMAL LOW (ref 70–99)
Glucose-Capillary: 135 mg/dL — ABNORMAL HIGH (ref 70–99)
Glucose-Capillary: 213 mg/dL — ABNORMAL HIGH (ref 70–99)

## 2022-12-14 SURGERY — ESOPHAGOGASTRODUODENOSCOPY (EGD) WITH PROPOFOL
Anesthesia: Monitor Anesthesia Care

## 2022-12-14 MED ORDER — PROPOFOL 10 MG/ML IV BOLUS
INTRAVENOUS | Status: DC | PRN
  Administered 2022-12-14: 20 mg via INTRAVENOUS
  Administered 2022-12-14 (×3): 10 mg via INTRAVENOUS

## 2022-12-14 MED ORDER — PROPOFOL 500 MG/50ML IV EMUL
INTRAVENOUS | Status: DC | PRN
  Administered 2022-12-14: 125 ug/kg/min via INTRAVENOUS

## 2022-12-14 MED ORDER — SODIUM CHLORIDE 0.9 % IV SOLN: INTRAVENOUS | Status: DC

## 2022-12-14 MED ORDER — ONDANSETRON HCL 4 MG/2ML IJ SOLN
INTRAMUSCULAR | Status: DC | PRN
  Administered 2022-12-14: 4 mg via INTRAVENOUS

## 2022-12-14 SURGICAL SUPPLY — 15 items

## 2022-12-14 NOTE — Transfer of Care (Signed)
Immediate Anesthesia Transfer of Care Note  [name]  Procedure(s) Performed: ESOPHAGOGASTRODUODENOSCOPY (EGD) WITH PROPOFOL BIOPSY  Patient Location: PACU  Anesthesia Type:MAC  Level of Consciousness: awake and alert   Airway & Oxygen Therapy: Patient Spontanous Breathing  Post-op Assessment: Report given to RN and Post -op Vital signs reviewed and stable  Post vital signs: Reviewed and stable  Last Vitals:  Vitals Value Taken Time  BP 91/47 12/14/22 1307  Temp    Pulse 63 12/14/22 1309  Resp 14 12/14/22 1309  SpO2 97 % 12/14/22 1309  Vitals shown include unvalidated device data.  Last Pain:  Vitals:   12/14/22 1145  TempSrc: Temporal  PainSc: 8       Patients Stated Pain Goal: 0 (03/50/09 3818)  Complications: No notable events documented.

## 2022-12-14 NOTE — Anesthesia Preprocedure Evaluation (Signed)
Anesthesia Evaluation  Patient identified by MRN, date of birth, ID band Patient awake    Reviewed: Allergy & Precautions, NPO status , Patient's Chart, lab work & pertinent test results  Airway Mallampati: III  TM Distance: >3 FB Neck ROM: Full    Dental  (+) Edentulous Lower, Edentulous Upper   Pulmonary former smoker   Pulmonary exam normal        Cardiovascular hypertension, Pt. on medications and Pt. on home beta blockers Normal cardiovascular exam     Neuro/Psych  Headaches PSYCHIATRIC DISORDERS Anxiety        GI/Hepatic Neg liver ROS,GERD  Medicated and Controlled,,  Endo/Other  diabetes, Oral Hypoglycemic AgentsHypothyroidism    Renal/GU Renal disease     Musculoskeletal negative musculoskeletal ROS (+)    Abdominal   Peds  Hematology negative hematology ROS (+)   Anesthesia Other Findings hematemesis  Reproductive/Obstetrics                             Anesthesia Physical Anesthesia Plan  ASA: 3  Anesthesia Plan: MAC   Post-op Pain Management:    Induction: Intravenous  PONV Risk Score and Plan: 2 and Propofol infusion and Treatment may vary due to age or medical condition  Airway Management Planned: Nasal Cannula  Additional Equipment:   Intra-op Plan:   Post-operative Plan:   Informed Consent: I have reviewed the patients History and Physical, chart, labs and discussed the procedure including the risks, benefits and alternatives for the proposed anesthesia with the patient or authorized representative who has indicated his/her understanding and acceptance.     Dental advisory given  Plan Discussed with: CRNA  Anesthesia Plan Comments:        Anesthesia Quick Evaluation

## 2022-12-14 NOTE — Interval H&P Note (Signed)
History and Physical Interval Note: Patient stable overnight - no hematemesis. Hgb stable. She is agreeable to EGD after discussion of risks / benefits, further recommendations pending the results.   12/14/2022 11:58 AM  Sherri Burns  has presented today for surgery, with the diagnosis of hematemesis.  The various methods of treatment have been discussed with the patient and family. After consideration of risks, benefits and other options for treatment, the patient has consented to  Procedure(s): ESOPHAGOGASTRODUODENOSCOPY (EGD) WITH PROPOFOL (N/A) as a surgical intervention.  The patient's history has been reviewed, patient examined, no change in status, stable for surgery.  I have reviewed the patient's chart and labs.  Questions were answered to the patient's satisfaction.     [place]

## 2022-12-14 NOTE — Op Note (Signed)
[facility] Patient Name: Sherri Burns Procedure Date : 12/14/2022 MRN: [id number] Attending MD: Carlota Raspberry. Havery Moros , MD, [id number] Date of Birth: 1936-09-08 CSN: [id number] Age: 87 Admit Type: Inpatient Procedure:                Upper GI endoscopy Indications:              Subjective report of hematemesis - Hgb and BUN                            normal, on omeprazole '20mg'$  / day Providers:                Carlota Raspberry. Havery Moros, MD, Dulcy Fanny, Luan Moore, Technician, Merrilyn Puma, CRNA Referring MD:              Medicines:                Monitored Anesthesia Care Complications:            No immediate complications. Estimated blood loss:                            Minimal. Estimated Blood Loss:     Estimated blood loss was minimal. Procedure:                Pre-Anesthesia Assessment:                           - Prior to the procedure, a History and Physical                            was performed, and patient medications and                            allergies were reviewed. The patient's tolerance of                            previous anesthesia was also reviewed. The risks                            and benefits of the procedure and the sedation                            options and risks were discussed with the patient.                            All questions were answered, and informed consent                            was obtained. Prior Anticoagulants: The patient has                            taken no anticoagulant or antiplatelet agents. ASA  Grade Assessment: III - A patient with severe                            systemic disease. After reviewing the risks and                            benefits, the patient was deemed in satisfactory                            condition to undergo the procedure.                           After obtaining informed consent, the endoscope was                             passed under direct vision. Throughout the                            procedure, the patient's blood pressure, pulse, and                            oxygen saturations were monitored continuously. The                            [id number]-H190 ([id number] Olympus endoscope was introduced                            through the mouth, and advanced to the second part                            of duodenum. The upper GI endoscopy was                            accomplished without difficulty. The patient                            tolerated the procedure well. Scope In: Scope Out: Findings:      Esophagogastric landmarks were identified: the Z-line was found at 36       cm, the gastroesophageal junction was found at 36 cm and the upper       extent of the gastric folds was found at 36 cm from the incisors.      The exam of the esophagus was otherwise normal.      A single small sessile polyp was found in the gastric antrum. Biopsies       were taken with a cold forceps for histology.      Multiple small sessile polyps were found in the gastric fundus and in       the gastric body, grossly consistent with benign fundic gland polyps.      Patchy mildly erythematous mucosa was found in the gastric body.       Biopsies were taken with a cold forceps for Helicobacter pylori testing.      The exam of the stomach was otherwise normal.      A medium diverticulum was found in the second  portion of the duodenum.      The exam of the duodenum was otherwise normal. Impression:               - Esophagogastric landmarks identified.                           - Normal esophagus otherwise                           - A single gastric polyp. Biopsied.                           - Multiple gastric polyps, likely benign fundic                            gland polyp.                           - Erythematous mucosa in the gastric body. Biopsied                            to rule out H pylori.                            - Normal stomach otherwise                           - Duodenal diverticulum.                           - Normal duodenum otherwise                           Overall, some mild gastritis but nothing to be                            causing overt GI blood loss on this exam. Biopsies                            taken to rule out H pylori. I think safe to go home                            on BID PPI Recommendation:           - Return patient to hospital ward for ongoing care.                           - Advance diet as tolerated.                           - Continue present medications.                           - Increase omeprazole to '20mg'$  twice daily as                            outpatient                           -  Await pathology results.                           - Cleared for discharge from bleeding perspective                           - Follow up with primary GI as outpatient                           - GI service will sign off for now Procedure Code(s):        --- Professional ---                           [phone], Esophagogastroduodenoscopy, flexible,                            transoral; with biopsy, single or multiple Diagnosis Code(s):        --- Professional ---                           K31.7, Polyp of stomach and duodenum                           K31.89, Other diseases of stomach and duodenum                           K92.0, Hematemesis CPT copyright 2022 [employer]. All rights reserved. The codes documented in this report are preliminary and upon coder review may  be revised to meet current compliance requirements. Remo Lipps P. Euretha Najarro, MD 12/14/2022 1:13:14 PM This report has been signed electronically. Number of Addenda: 0

## 2022-12-14 NOTE — Hospital Course (Signed)
Sherri Burns is an 87 yo female with PMH chronic diastolic CHF, DM II, HLD, ischemic colitis, HTN, hypothyroidism who presented with rectal bleeding and hematemesis.  She was seen at urgent care prior to presenting to the ER. She has been followed closely outpatient for internal hemorrhoids and recently seen by Dr. Drue Flirt on 10/09/2022.  Plan was for trial of Anusol followed by possible hemorrhoidectomy in the OR. She underwent CT angio on admission which was negative for active bleeding or bowel inflammation.  Underlying diverticulosis appreciated.  Prior right nephrectomy noted. She was admitted for GI evaluation and evaluation with EGD.

## 2022-12-14 NOTE — Progress Notes (Signed)
Progress Note    Sherri Burns   ZOX:[id number]  DOB: [birth year]  DOA: 12/12/2022     0 PCP: Sherri Ebbs, MD  Initial CC: rectal bleeding and hematemesis   Hospital Course: Sherri Burns is an 87 yo female with PMH chronic diastolic CHF, DM II, HLD, ischemic colitis, HTN, hypothyroidism who presented with rectal bleeding and hematemesis.  She was seen at urgent care prior to presenting to the ER. She has been followed closely outpatient for internal hemorrhoids and recently seen by Dr. Drue Burns on 10/09/2022.  Plan was for trial of Anusol followed by possible hemorrhoidectomy in the OR. She underwent CT angio on admission which was negative for active bleeding or bowel inflammation.  Underlying diverticulosis appreciated.  Prior right nephrectomy noted. She was admitted for GI evaluation and evaluation with EGD.  Interval History:  Resting comfortably in bed when seen this morning.  No further hematemesis overnight.  Undergoing EGD today for evaluation.  Assessment and Plan:  UGI bleeding -Patient is presenting with hematemesis, suggestive of upper GI bleeding. -She reports mouth pain and a tongue coating, suggestive of candidiasis; however, she says that she has been taking medication for this and it isn't working -She reports about a month of symptoms and 4 total episodes including just prior to admission - undergoing EGD on 12/14/22, follow up results - continue PPI - trend Hgb; remaining stable   Thrush - continue fluconazole  - follow up EGD results   Hemorrhoidal bleeding -Also with lower GI bleeding that by description sounds hemorrhoidal -She has been seen for this at [facility] on 11/14 with plan for medical treatment and possible need for hemorrhoidectomy if this fails -Will need outpatient f/u for this issue; following with Dr. Drue Burns  -Continue Anusol but change to BID standing rather than PRN   DM -A1c 6.9% -hold glipizide -Cover with sensitive-scale SSI    HTN   -Continue amlodipine, metoprolol   HLD -Hold Zetia due to limited inpatient utility   Hypothyroidism -Does not appear to be taking medications at this time -Recent normal thyroid testing on 10/25/22   Stage 3b CKD -Appears to be stable at this time -Attempt to avoid nephrotoxic medications   Chronic diastolic CHF -Appears to be compensated -Hold Lasix -Will follow   Mood d/o -?MCI - her daughter has noticed some memory problems -Continue Seroquel qhs -Will add delirium precautions   Old records reviewed in assessment of this patient  Antimicrobials: Fluconazole 12/13/2022 >> current  DVT prophylaxis:  SCDs Start: 12/13/22 1729   Code Status:   Code Status: Full Code  Mobility Assessment (last 72 hours)     Mobility Assessment     Row Name 12/13/22 2100           Does patient have an order for bedrest or is patient medically unstable No - Continue assessment       What is the highest level of mobility based on the progressive mobility assessment? Level 4 (Walks with assist in room) - Balance while marching in place and cannot step forward and back - Complete                Barriers to discharge:  Disposition Plan:  Home Status is: Obs  Objective: Blood pressure 120/70, pulse 74, temperature 97.6 F (36.4 C), temperature source Temporal, resp. rate 16, height 5' 6.5" (1.689 m), weight 73.9 kg, SpO2 (!) 18 %.  Examination:  Physical Exam Constitutional:      General: She is not  in acute distress.    Appearance: She is well-developed. She is obese.  HENT:     Head: Normocephalic and atraumatic.     Mouth/Throat:     Mouth: Mucous membranes are moist.  Eyes:     Extraocular Movements: Extraocular movements intact.  Cardiovascular:     Rate and Rhythm: Normal rate and regular rhythm.  Pulmonary:     Effort: Pulmonary effort is normal.     Breath sounds: Normal breath sounds.  Abdominal:     General: Bowel sounds are normal. There is no  distension.     Palpations: Abdomen is soft.     Tenderness: There is no abdominal tenderness.  Musculoskeletal:        General: Normal range of motion.     Cervical back: Normal range of motion and neck supple.  Skin:    General: Skin is warm and dry.  Neurological:     General: No focal deficit present.     Mental Status: She is alert.  Psychiatric:        Mood and Affect: Mood normal.      Consultants:  GI  Procedures:  12/14/22: EGD  Data Reviewed: Results for orders placed or performed during the hospital encounter of 12/12/22 (from the past 24 hour(s))  CBC     Status: Abnormal   Collection Time: 12/13/22  2:50 PM  Result Value Ref Range   WBC 4.9 4.0 - 10.5 K/uL   RBC 4.33 3.87 - 5.11 MIL/uL   Hemoglobin 14.7 12.0 - 15.0 g/dL   HCT 42.7 36.0 - 46.0 %   MCV 98.6 80.0 - 100.0 fL   MCH 33.9 26.0 - 34.0 pg   MCHC 34.4 30.0 - 36.0 g/dL   RDW 14.4 11.5 - 15.5 %   Platelets  150 - 400 K/uL    PLATELET CLUMPS NOTED ON SMEAR, COUNT APPEARS DECREASED   nRBC 0.4 (H) 0.0 - 0.2 %  Hemoglobin A1c     Status: Abnormal   Collection Time: 12/13/22  2:50 PM  Result Value Ref Range   Hgb A1c MFr Bld 6.9 (H) 4.8 - 5.6 %   Mean Plasma Glucose 151.33 mg/dL  Basic metabolic panel     Status: Abnormal   Collection Time: 12/14/22  4:35 AM  Result Value Ref Range   Sodium 141 135 - 145 mmol/L   Potassium 3.9 3.5 - 5.1 mmol/L   Chloride 99 98 - 111 mmol/L   CO2 29 22 - 32 mmol/L   Glucose, Bld 95 70 - 99 mg/dL   BUN 14 8 - 23 mg/dL   Creatinine, Ser 1.72 (H) 0.44 - 1.00 mg/dL   Calcium 9.8 8.9 - 10.3 mg/dL   GFR, Estimated 29 (L) >60 mL/min   Anion gap 13 5 - 15  CBC     Status: None   Collection Time: 12/14/22  4:35 AM  Result Value Ref Range   WBC 7.4 4.0 - 10.5 K/uL   RBC 4.36 3.87 - 5.11 MIL/uL   Hemoglobin 13.9 12.0 - 15.0 g/dL   HCT 43.2 36.0 - 46.0 %   MCV 99.1 80.0 - 100.0 fL   MCH 31.9 26.0 - 34.0 pg   MCHC 32.2 30.0 - 36.0 g/dL   RDW 14.2 11.5 - 15.5 %    Platelets 196 150 - 400 K/uL   nRBC 0.0 0.0 - 0.2 %  Glucose, capillary     Status: None   Collection Time: 12/14/22  8:42 AM  Result Value Ref Range   Glucose-Capillary 93 70 - 99 mg/dL  Glucose, capillary     Status: None   Collection Time: 12/14/22 11:27 AM  Result Value Ref Range   Glucose-Capillary 91 70 - 99 mg/dL    I have reviewed pertinent nursing notes, vitals, labs, and images as necessary. I have ordered labwork to follow up on as indicated.  I have reviewed the last notes from staff over past 24 hours. I have discussed patient's care plan and test results with nursing staff, CM/SW, and other staff as appropriate.  Time spent: Greater than 50% of the 55 minute visit was spent in counseling/coordination of care for the patient as laid out in the A&P.   LOS: 0 days   Dwyane Dee, MD [facility] 12/14/2022, 12:31 PM

## 2022-12-14 NOTE — Progress Notes (Signed)
Pt BG 66, pt is alert and oriented, given OJ and crackers, will recheck accordingly.  Dr. Sabino Gasser made aware.

## 2022-12-15 DIAGNOSIS — K922 Gastrointestinal hemorrhage, unspecified: Secondary | ICD-10-CM | POA: Diagnosis not present

## 2022-12-15 DIAGNOSIS — E1142 Type 2 diabetes mellitus with diabetic polyneuropathy: Secondary | ICD-10-CM

## 2022-12-15 LAB — CBC WITH DIFFERENTIAL/PLATELET
Abs Immature Granulocytes: 0.02 10*3/uL (ref 0.00–0.07)
Basophils Absolute: 0 10*3/uL (ref 0.0–0.1)
Basophils Relative: 1 %
Eosinophils Absolute: 0.1 10*3/uL (ref 0.0–0.5)
Eosinophils Relative: 2 %
HCT: 37.4 % (ref 36.0–46.0)
Hemoglobin: 12.5 g/dL (ref 12.0–15.0)
Immature Granulocytes: 0 %
Lymphocytes Relative: 37 %
Lymphs Abs: 2.3 10*3/uL (ref 0.7–4.0)
MCH: 32.6 pg (ref 26.0–34.0)
MCHC: 33.4 g/dL (ref 30.0–36.0)
MCV: 97.4 fL (ref 80.0–100.0)
Monocytes Absolute: 0.5 10*3/uL (ref 0.1–1.0)
Monocytes Relative: 7 %
Neutro Abs: 3.2 10*3/uL (ref 1.7–7.7)
Neutrophils Relative %: 53 %
Platelets: 168 10*3/uL (ref 150–400)
RBC: 3.84 MIL/uL — ABNORMAL LOW (ref 3.87–5.11)
RDW: 13.9 % (ref 11.5–15.5)
WBC: 6.1 10*3/uL (ref 4.0–10.5)
nRBC: 0 % (ref 0.0–0.2)

## 2022-12-15 LAB — GLUCOSE, CAPILLARY
Glucose-Capillary: 108 mg/dL — ABNORMAL HIGH (ref 70–99)
Glucose-Capillary: 217 mg/dL — ABNORMAL HIGH (ref 70–99)

## 2022-12-15 LAB — BASIC METABOLIC PANEL
Anion gap: 8 (ref 5–15)
BUN: 12 mg/dL (ref 8–23)
CO2: 26 mmol/L (ref 22–32)
Calcium: 9.2 mg/dL (ref 8.9–10.3)
Chloride: 106 mmol/L (ref 98–111)
Creatinine, Ser: 1.61 mg/dL — ABNORMAL HIGH (ref 0.44–1.00)
GFR, Estimated: 31 mL/min — ABNORMAL LOW (ref >60)
Glucose, Bld: 117 mg/dL — ABNORMAL HIGH (ref 70–99)
Potassium: 3.9 mmol/L (ref 3.5–5.1)
Sodium: 140 mmol/L (ref 135–145)

## 2022-12-15 LAB — MAGNESIUM: Magnesium: 1.9 mg/dL (ref 1.7–2.4)

## 2022-12-15 MED ORDER — LIP MEDEX EX OINT
TOPICAL_OINTMENT | CUTANEOUS | Status: DC | PRN
  Filled 2022-12-15: qty 7

## 2022-12-15 MED ORDER — OMEPRAZOLE 20 MG PO CPDR: 20.0000 mg | DELAYED_RELEASE_CAPSULE | Freq: Two times a day (BID) | ORAL | 3 refills | Status: AC

## 2022-12-15 NOTE — Progress Notes (Signed)
  Transition of Care [facility]) Screening Note   Patient Details  Name: Sherri Burns Date of Birth: [birth year]   Transition of Care [facility]) CM/SW Contact:    Bartholomew Crews, RN Phone Number: [phone] 12/15/2022, 8:12 AM  S/p EGD yesterday  Transition of Care Department [facility]) has reviewed patient and no TOC needs have been identified at this time. We will continue to monitor patient advancement through interdisciplinary progression rounds. If new patient transition needs arise, please place a TOC consult.

## 2022-12-15 NOTE — Anesthesia Postprocedure Evaluation (Signed)
Anesthesia Post Note  [name]  Procedure(s) Performed: ESOPHAGOGASTRODUODENOSCOPY (EGD) WITH PROPOFOL BIOPSY     Patient location during evaluation: Endoscopy Anesthesia Type: MAC Level of consciousness: awake Pain management: pain level controlled Vital Signs Assessment: post-procedure vital signs reviewed and stable Respiratory status: spontaneous breathing, nonlabored ventilation and respiratory function stable Cardiovascular status: blood pressure returned to baseline and stable Postop Assessment: no apparent nausea or vomiting Anesthetic complications: no   No notable events documented.  Last Vitals:  Vitals:   12/14/22 1332 12/14/22 2321  BP: 118/64 (!) 109/55  Pulse: 63 75  Resp: 17 18  Temp: 36.7 C 37.1 C  SpO2: 98% 98%    Last Pain:  Vitals:   12/14/22 2321  TempSrc: Oral  PainSc: 0-No pain                 Sherri Burns

## 2022-12-15 NOTE — Progress Notes (Signed)
Discharge instructions given to the patient and Rollene Fare (friend providing transportation home).  Both verbalized understanding.  Needs addressed. Discharged home.

## 2022-12-15 NOTE — Discharge Summary (Signed)
Physician Discharge Summary   Sherri Burns MVH:[id number] DOB: [birth year] DOA: 12/12/2022  PCP: Nolene Ebbs, MD  Admit date: 12/12/2022 Discharge date: 12/15/2022  Barriers to discharge: none  Admitted From: Home Disposition:  Home Discharging physician: Dwyane Dee, MD  Recommendations for Outpatient Follow-up:  Follow up with GI Follow up with surgery  Home Health:  Equipment/Devices:   Discharge Condition: stable CODE STATUS: Full Diet recommendation:  Diet Orders (From admission, onward)     Start     Ordered   12/14/22 1512  Diet regular Room service appropriate? Yes; Fluid consistency: Thin  Diet effective now       Question Answer Comment  Room service appropriate? Yes   Fluid consistency: Thin      12/14/22 1511            Hospital Course: Sherri Burns is an 87 yo female with PMH chronic diastolic CHF, DM II, HLD, ischemic colitis, HTN, hypothyroidism who presented with rectal bleeding and hematemesis.  She was seen at urgent care prior to presenting to the ER. She has been followed closely outpatient for internal hemorrhoids and recently seen by Dr. Drue Flirt on 10/09/2022.  Plan was for trial of Anusol followed by possible hemorrhoidectomy in the OR. She underwent CT angio on admission which was negative for active bleeding or bowel inflammation.  Underlying diverticulosis appreciated.  Prior right nephrectomy noted. She was admitted for GI evaluation and evaluation with EGD.  Assessment and Plan:  UGI bleeding -Patient is presenting with hematemesis, suggestive of upper GI bleeding. -She reports mouth pain and a tongue coating, suggestive of candidiasis; however, she says that she has been taking medication for this and it isn't working -She reports about a month of symptoms and 4 total episodes including just prior to admission -EGD performed on 12/14/2022.  Mild gastritis appreciated, no thrush or esophageal candidiasis noted -Recommended to  continue on twice daily Prilosec at discharge per GI.  Prescription sent -Outpatient follow-up with GI -Hemoglobin remained stable prior to discharge with no further hematemesis  Thrush -No further findings noted on oral exam prior to discharge and no findings noted on EGD -DC fluconazole  Hemorrhoidal bleeding -Also with lower GI bleeding that by description sounds hemorrhoidal -She has been seen for this at [facility] on 11/14 with plan for medical treatment and possible need for hemorrhoidectomy if this fails -Will need outpatient f/u for this issue; following with Dr. Drue Flirt  -Continue Anusol  DM -A1c 6.9% -resume glipizide at discharge   HTN  -Continue amlodipine, metoprolol   HLD -Hold Zetia due to limited inpatient utility   Hypothyroidism -Does not appear to be taking medications at this time -Recent normal thyroid testing on 10/25/22   Stage 3b CKD -Appears to be stable at this time -Attempt to avoid nephrotoxic medications   Chronic diastolic CHF -Resume home regimen at discharge   Mood d/o -?[facility] - her daughter has noticed some memory problems    The patient's chronic medical conditions were treated accordingly per the patient's home medication regimen except as noted.  On day of discharge, patient was felt deemed stable for discharge. Patient/family member advised to call PCP or come back to ER if needed.   Principal Diagnosis: Acute upper GI bleeding  Discharge Diagnoses: Active Hospital Problems   Diagnosis Date Noted   Acute upper GI bleeding 12/13/2022   Gastritis and gastroduodenitis 12/14/2022   Gastric polyps 12/14/2022   Hemorrhoids 12/13/2022   Thrush of mouth and esophagus ([place]) 12/13/2022  Hematemesis 12/13/2022   Chronic diastolic CHF (congestive heart failure) ([place]) 02/21/2018   Hyperlipidemia    Type 2 diabetes mellitus with diabetic polyneuropathy, without long-term current use of insulin ([place]) 09/23/2012   Hypothyroidism    CKD (chronic  kidney disease), stage III [facility])     Resolved Hospital Problems  No resolved problems to display.     Discharge Instructions     Increase activity slowly   Complete by: As directed       Allergies as of 12/15/2022       Reactions   Ace Inhibitors Swelling   See 02/16/19    Codeine Other (See Comments)   Hallucinations    Januvia [sitagliptin] Diarrhea   Metformin And Related Other (See Comments)   GI upset   Tessalon Perles [benzonatate] Other (See Comments)   GI upset   Ciprocin-fluocin-procin [fluocinolone Acetonide] Other (See Comments)   Unknown reaction   Clonidine Derivatives Other (See Comments)   Dry mouth   Crestor [rosuvastatin Calcium] Other (See Comments)   Myalgias    Penicillins Other (See Comments)   Hallucinations   Zocor [simvastatin] Other (See Comments)   GI upset        Medication List     TAKE these medications    acetaminophen 500 MG tablet Commonly known as: TYLENOL Take 500-1,000 mg by mouth daily as needed for headache, fever, moderate pain or mild pain.   allopurinol 100 MG tablet Commonly known as: ZYLOPRIM Take 1 tablet (100 mg total) by mouth daily. What changed: when to take this   amLODipine 10 MG tablet Commonly known as: NORVASC Take 10 mg by mouth in the morning.   dicyclomine 10 MG capsule Commonly known as: BENTYL TAKE ONE CAPSULE BY MOUTH TWICE A DAY What changed: when to take this   ezetimibe 10 MG tablet Commonly known as: ZETIA Take 10 mg by mouth in the morning.   furosemide 40 MG tablet Commonly known as: LASIX Take 0.5 tablets (20 mg total) by mouth daily. What changed:  how much to take when to take this   glipiZIDE 10 MG tablet Commonly known as: GLUCOTROL Take 10 mg by mouth in the morning.   hydrocortisone 2.5 % cream Apply 1 Application topically 2 (two) times daily as needed (hemorrhoids).   hydrocortisone 25 MG suppository Commonly known as: ANUSOL-HC Place 25 mg rectally 2 (two) times  daily as needed for hemorrhoids.   hyoscyamine 0.375 MG 12 hr tablet Commonly known as: LEVBID Take 0.375 mg by mouth 2 (two) times daily as needed for cramping.   Linzess 72 MCG capsule Generic drug: linaclotide Take 72 mcg by mouth every morning.   metoprolol tartrate 100 MG tablet Commonly known as: LOPRESSOR Take 1 tablet (100 mg total) by mouth 2 (two) times daily.   nystatin 100000 UNIT/ML suspension Commonly known as: MYCOSTATIN Use as directed 5 mLs in the mouth or throat 4 (four) times daily.   omeprazole 20 MG capsule Commonly known as: PRILOSEC Take 1 capsule (20 mg total) by mouth 2 (two) times daily before a meal. What changed: when to take this   ondansetron 4 MG tablet Commonly known as: ZOFRAN Take 1 tablet (4 mg total) by mouth every 6 (six) hours. What changed:  when to take this reasons to take this   QUEtiapine 25 MG tablet Commonly known as: SEROQUEL Take 25 mg by mouth at bedtime.   Vitamin D3 Super Strength 50 MCG (2000 UT) Caps Generic drug: Cholecalciferol Take  2,000 Units by mouth in the morning.   vitamin E 180 MG (400 UNITS) capsule Generic drug: vitamin E Take 400 Units by mouth in the morning.        Allergies  Allergen Reactions   Ace Inhibitors Swelling    See 02/16/19    Codeine Other (See Comments)    Hallucinations    Januvia [Sitagliptin] Diarrhea   Metformin And Related Other (See Comments)    GI upset   Tessalon Perles [Benzonatate] Other (See Comments)    GI upset   Ciprocin-Fluocin-Procin [Fluocinolone Acetonide] Other (See Comments)    Unknown reaction   Clonidine Derivatives Other (See Comments)    Dry mouth   Crestor [Rosuvastatin Calcium] Other (See Comments)    Myalgias    Penicillins Other (See Comments)    Hallucinations    Zocor [Simvastatin] Other (See Comments)    GI upset     Consultations: GI  Procedures: 12/14/22: EGD  Discharge Exam: BP 122/61 (BP Location: Left Arm)   Pulse 65   Temp  97.9 F (36.6 C)   Resp 18   Ht 5' 6.5" (1.689 m)   Wt 73.9 kg   SpO2 96%   BMI 25.90 kg/m  Physical Exam Constitutional:      General: She is not in acute distress.    Appearance: She is well-developed. She is obese.  HENT:     Head: Normocephalic and atraumatic.     Mouth/Throat:     Mouth: Mucous membranes are moist.  Eyes:     Extraocular Movements: Extraocular movements intact.  Cardiovascular:     Rate and Rhythm: Normal rate and regular rhythm.  Pulmonary:     Effort: Pulmonary effort is normal.     Breath sounds: Normal breath sounds.  Abdominal:     General: Bowel sounds are normal. There is no distension.     Palpations: Abdomen is soft.     Tenderness: There is no abdominal tenderness.  Musculoskeletal:        General: Normal range of motion.     Cervical back: Normal range of motion and neck supple.  Skin:    General: Skin is warm and dry.  Neurological:     General: No focal deficit present.     Mental Status: She is alert.  Psychiatric:        Mood and Affect: Mood normal.      The results of significant diagnostics from this hospitalization (including imaging, microbiology, ancillary and laboratory) are listed below for reference.   Microbiology: No results found for this or any previous visit (from the past 240 hour(s)).   Labs: BNP (last 3 results) No results for input(s): "BNP" in the last 8760 hours. Basic Metabolic Panel: Recent Labs  Lab 12/12/22 1405 12/14/22 0435 12/15/22 0356  NA 137 141 140  K 4.7 3.9 3.9  CL 100 99 106  CO2 '26 29 26  '$ GLUCOSE 113* 95 117*  BUN '12 14 12  '$ CREATININE 1.59* 1.72* 1.61*  CALCIUM 9.6 9.8 9.2  MG  --   --  1.9   Liver Function Tests: Recent Labs  Lab 12/12/22 1405  AST 34  ALT 45*  ALKPHOS 62  BILITOT 0.8  PROT 7.4  ALBUMIN 4.3   Recent Labs  Lab 12/12/22 1405  LIPASE 32   No results for input(s): "AMMONIA" in the last 168 hours. CBC: Recent Labs  Lab 12/12/22 1405 12/13/22 1450  12/14/22 0435 12/15/22 0356  WBC 8.9 4.9 7.4  6.1  NEUTROABS  --   --   --  3.2  HGB 14.3 14.7 13.9 12.5  HCT 45.0 42.7 43.2 37.4  MCV 101.4* 98.6 99.1 97.4  PLT 219 PLATELET CLUMPS NOTED ON SMEAR, COUNT APPEARS DECREASED 196 168   Cardiac Enzymes: No results for input(s): "CKTOTAL", "CKMB", "CKMBINDEX", "TROPONINI" in the last 168 hours. BNP: Invalid input(s): "POCBNP" CBG: Recent Labs  Lab 12/14/22 1611 12/14/22 1655 12/14/22 1941 12/15/22 0820 12/15/22 1128  GLUCAP 66* 135* 213* 108* 217*   D-Dimer No results for input(s): "DDIMER" in the last 72 hours. Hgb A1c Recent Labs    12/13/22 1450  HGBA1C 6.9*   Lipid Profile No results for input(s): "CHOL", "HDL", "LDLCALC", "TRIG", "CHOLHDL", "LDLDIRECT" in the last 72 hours. Thyroid function studies No results for input(s): "TSH", "T4TOTAL", "T3FREE", "THYROIDAB" in the last 72 hours.  Invalid input(s): "FREET3" Anemia work up No results for input(s): "VITAMINB12", "FOLATE", "FERRITIN", "TIBC", "IRON", "RETICCTPCT" in the last 72 hours. Urinalysis    Component Value Date/Time   COLORURINE COLORLESS (A) 11/23/2022 0648   APPEARANCEUR CLEAR 11/23/2022 0648   LABSPEC 1.015 11/23/2022 0648   PHURINE 7.0 11/23/2022 0648   GLUCOSEU NEGATIVE 11/23/2022 0648   HGBUR NEGATIVE 11/23/2022 0648   BILIRUBINUR NEGATIVE 11/23/2022 0648   BILIRUBINUR negative 12/06/2020 1634   BILIRUBINUR small 11/09/2014 1629   KETONESUR NEGATIVE 11/23/2022 0648   PROTEINUR NEGATIVE 11/23/2022 0648   UROBILINOGEN 0.2 12/06/2020 1634   UROBILINOGEN 0.2 04/28/2013 2321   NITRITE NEGATIVE 11/23/2022 0648   LEUKOCYTESUR NEGATIVE 11/23/2022 0648   Sepsis Labs Recent Labs  Lab 12/12/22 1405 12/13/22 1450 12/14/22 0435 12/15/22 0356  WBC 8.9 4.9 7.4 6.1   Microbiology No results found for this or any previous visit (from the past 240 hour(s)).  Procedures/Studies: CT ANGIO GI BLEED  Result Date: 12/13/2022 CLINICAL DATA:  Hematemesis,  bloody stool concern for GI bleed. EXAM: CTA ABDOMEN AND PELVIS WITHOUT AND WITH CONTRAST TECHNIQUE: Multidetector CT imaging of the abdomen and pelvis was performed using the standard protocol during bolus administration of intravenous contrast. Multiplanar reconstructed images and MIPs were obtained and reviewed to evaluate the vascular anatomy. RADIATION DOSE REDUCTION: This exam was performed according to the departmental dose-optimization program which includes automated exposure control, adjustment of the mA and/or kV according to patient size and/or use of iterative reconstruction technique. CONTRAST:  29m OMNIPAQUE IOHEXOL 350 MG/ML SOLN COMPARISON:  Multiple priors including most recent CT November 23, 2022. FINDINGS: VASCULAR Aorta: Aortic atherosclerosis. Normal caliber aorta without aneurysm, dissection, vasculitis or significant stenosis. Right gastric artery arises directly from the aorta. Celiac: Patent without evidence of aneurysm, dissection, vasculitis or significant stenosis. SMA: Patent without evidence of aneurysm, dissection, vasculitis or significant stenosis. Renals: Left renal arteries are patent without evidence of aneurysm, dissection, vasculitis, fibromuscular dysplasia or significant stenosis. Ligation of the right renal artery with prior right nephrectomy. IMA: Patent without evidence of aneurysm, dissection, vasculitis or significant stenosis. Inflow: Patent without evidence of aneurysm, dissection, vasculitis or significant stenosis. Proximal Outflow: Bilateral common femoral and visualized portions of the superficial and profunda femoral arteries are patent without evidence of aneurysm, dissection, vasculitis or significant stenosis. Veins: No obvious venous abnormality within the limitations of this arterial phase study. Review of the MIP images confirms the above findings. NON-VASCULAR Lower chest: No acute abnormality. Hepatobiliary: There are few wedge-shaped areas of hyper  enhancement of the periphery of the right lobe of the liver for instance on image 46/8 and 60/2 which  equilibrate to background liver on delayed imaging and are compatible with benign transient hepatic attenuation differences/intrahepatic shunts. Gallbladder surgically absent. Similar mild prominence of the biliary tree favored reservoir effect post cholecystectomy. Pancreas: No pancreatic ductal dilation or evidence of acute inflammation. Spleen: No splenomegaly. Adrenals/Urinary Tract: Right adrenal gland is not definitely identified and may be surgically absent. Left adrenal gland appears normal. Prior right nephrectomy without suspicious nodularity in the nephrectomy bed. No left-sided hydronephrosis. Left kidney demonstrate symmetric enhancement without suspicious renal mass. Urinary bladder is unremarkable for degree of distension. Stomach/Bowel: Stomach is minimally distended limiting evaluation. No pathologic dilation of small or large bowel. Duodenal diverticulum. Cecum chronically located in the pelvis with noninflamed appendix. Colonic diverticulosis without findings of acute diverticulitis. No evidence of abnormal intraluminal contrast material and hollow viscus to suggest active GI bleed. Lymphatic: No pathologically enlarged abdominal or pelvic lymph nodes. Reproductive: Status post hysterectomy. No adnexal masses. Other: Pneumoperitoneum. No abdominopelvic ascites. Postsurgical change in the ventral abdominal wall. Musculoskeletal: Advanced multilevel degenerative changes spine with chronic spondylolisthesis of L3-L4 through L5-S1. Flowing endplate osteophytes with multilevel associated interbody ankylosis. IMPRESSION: 1. No evidence of active gastrointestinal bleed or acute bowel inflammation. 2. Colonic diverticulosis without findings of acute diverticulitis. 3. Prior right nephrectomy without suspicious nodularity in the nephrectomy bed. 4. Wedge-shaped areas of arterial hyperenhancement in the  peripheral right lobe of the liver equilibrate to background liver on delayed imaging and are most compatible with a benign intrahepatic shunts/transient hepatic attenuation differences. 5.  Aortic Atherosclerosis (ICD10-I70.0). Electronically Signed   By: Dahlia Bailiff M.D.   On: 12/13/2022 16:02   CT ABDOMEN PELVIS W CONTRAST  Result Date: 11/23/2022 CLINICAL DATA:  87 year old female with history of rectal cancer presenting with constipation. Possible GI bleed. * Tracking Code: BO * EXAM: CT ABDOMEN AND PELVIS WITH CONTRAST TECHNIQUE: Multidetector CT imaging of the abdomen and pelvis was performed using the standard protocol following bolus administration of intravenous contrast. RADIATION DOSE REDUCTION: This exam was performed according to the departmental dose-optimization program which includes automated exposure control, adjustment of the mA and/or kV according to patient size and/or use of iterative reconstruction technique. CONTRAST:  70m OMNIPAQUE IOHEXOL 300 MG/ML  SOLN COMPARISON:  CT of the abdomen and pelvis 06/19/2022. FINDINGS: Lower chest: Unremarkable. Hepatobiliary: No suspicious cystic or solid hepatic lesions. No intra or extrahepatic biliary ductal dilatation. Status post cholecystectomy. Pancreas: No pancreatic mass. No pancreatic ductal dilatation. No pancreatic or peripancreatic fluid collections or inflammatory changes. Spleen: Unremarkable. Adrenals/Urinary Tract: Status post right radical nephrectomy. No unexpected soft tissue mass in the nephrectomy bed to suggest locally recurrent disease. Right adrenal gland is not confidently identified may also be surgically absent. Left kidney and left adrenal gland are normal in appearance. No left hydroureteronephrosis. Urinary bladder is unremarkable in appearance. Stomach/Bowel: The appearance of the stomach is normal. Large duodenal diverticulum extending from the third portion of the duodenum, without surrounding inflammatory changes.  There is no pathologic dilatation of small bowel or colon. Scattered colonic diverticula are noted, without surrounding inflammatory changes to indicate an acute diverticulitis at this time. Normal appendix. Vascular/Lymphatic: Atherosclerosis throughout the abdominal aorta and pelvic vasculature. No lymphadenopathy noted in the abdomen or pelvis. Reproductive: Status post hysterectomy. Ovaries are not confidently identified may be surgically absent or atrophic. Other: No significant volume of ascites.  No pneumoperitoneum. Musculoskeletal: There are no aggressive appearing lytic or blastic lesions noted in the visualized portions of the skeleton. IMPRESSION: 1. No acute findings are noted in  the abdomen or pelvis to account for the patient's symptoms. 2. Colonic diverticulosis without evidence of acute diverticulitis at this time. 3. Aortic atherosclerosis. 4. No definite evidence of recurrent or metastatic disease in the abdomen or pelvis. 5. Additional incidental findings, similar to prior studies, as above. Electronically Signed   By: Vinnie Langton M.D.   On: 11/23/2022 06:44   DG Abdomen 1 View  Result Date: 11/23/2022 CLINICAL DATA:  87 year old female with history of epigastric pain, bloating and constipation. EXAM: ABDOMEN - 1 VIEW COMPARISON:  Abdominal radiograph 06/20/2021. FINDINGS: Gas and stool are seen scattered throughout the colon extending to the level of the distal rectum. No pathologic distension of small bowel is noted. Large volume of stool throughout the colon and rectum. No gross evidence of pneumoperitoneum. Surgical clips project over the right upper quadrant of the abdomen, likely from prior cholecystectomy. IMPRESSION: 1. Nonobstructive bowel gas pattern. 2. No pneumoperitoneum. 3. Large volume of stool throughout the colon and rectum, compatible with reported clinical history of constipation. Electronically Signed   By: Vinnie Langton M.D.   On: 11/23/2022 05:09     Time  coordinating discharge: Over 30 minutes    Dwyane Dee, MD  [facility] 12/15/2022, 3:58 PM

## 2022-12-16 ENCOUNTER — Encounter (HOSPITAL_COMMUNITY): Payer: Self-pay | Admitting: Gastroenterology

## 2022-12-17 LAB — SURGICAL PATHOLOGY

## 2022-12-19 ENCOUNTER — Encounter: Payer: Self-pay | Admitting: Gastroenterology

## 2022-12-25 ENCOUNTER — Encounter: Payer: Self-pay | Admitting: Family Medicine

## 2022-12-25 ENCOUNTER — Ambulatory Visit (INDEPENDENT_AMBULATORY_CARE_PROVIDER_SITE_OTHER): Payer: Medicare Other | Admitting: Family Medicine

## 2022-12-25 VITALS — BP 116/70 | HR 70 | Temp 98.0°F | Ht 65.0 in | Wt 158.4 lb

## 2022-12-25 DIAGNOSIS — F411 Generalized anxiety disorder: Secondary | ICD-10-CM

## 2022-12-25 DIAGNOSIS — E038 Other specified hypothyroidism: Secondary | ICD-10-CM

## 2022-12-25 DIAGNOSIS — M19011 Primary osteoarthritis, right shoulder: Secondary | ICD-10-CM

## 2022-12-25 DIAGNOSIS — E1142 Type 2 diabetes mellitus with diabetic polyneuropathy: Secondary | ICD-10-CM | POA: Diagnosis not present

## 2022-12-25 MED ORDER — DICLOFENAC SODIUM 1 % EX GEL: 2.0000 g | Freq: Four times a day (QID) | CUTANEOUS | 5 refills | Status: AC

## 2022-12-25 MED ORDER — CITALOPRAM HYDROBROMIDE 10 MG PO TABS: 10.0000 mg | ORAL_TABLET | Freq: Every day | ORAL | 3 refills | Status: DC

## 2022-12-25 NOTE — Assessment & Plan Note (Signed)
Patient has been using diclofenac 1% gel, 2 grams QID, will call in refills for her.

## 2022-12-25 NOTE — Assessment & Plan Note (Signed)
On glipizide 10 mg daily, last A1C was done in the hospital and was 6.9. will continue this medication as prescribed.

## 2022-12-25 NOTE — Assessment & Plan Note (Signed)
Reviewed last TSH which was WNL. Continue 75 mcg daily.

## 2022-12-25 NOTE — Assessment & Plan Note (Signed)
With depression symptoms, the patient's PHQ score is 18. We discussed adding citalopram 10 mg at bedtime and she is agreeable to this treatment. I will see her back short term in 3 months to re-evaluate her symptoms.

## 2022-12-25 NOTE — Progress Notes (Signed)
New Patient Office Visit  Subjective    Patient ID: Sherri Burns, female    DOB: 03-Oct-1936  Age: 87 y.o. MRN: [id number]  CC:  Chief Complaint  Patient presents with   Establish Care   Stress    Patient requests medication for "her nerves" due to a number of  family and health problems, states her son committed suicide and she feels she has not gotten over it    HPI Sherri Burns presents to establish care Patient states that she has some issues with anxiety and depression symptoms, states that her son committed suicide in 2003. States she was given medication that was helping her, but states that she quit smoking last year and she stopped the medication at that time. States  that she has had a lot of tragedy in her family- was responsible for raising her brothers and sisters, her children. Reviewed PHQ score and we discussed options for treatment.  Patient has a GI specialist that she sees regularly. She was recently hospitalized for GI bleeding, likely from hemorrhoids. She just recently had an EGD that showed mild gastritis, she was placed on prilosec 20 mg twice a day. She is also on linzess and is to follow up with her GI doctor soon and possibly get a surgery to remove the hemorrhoids.   DM-- patient's last A1C 2 weeks ago was 6.9. states that she does not check her sugars at home.  Is on glipizide 10 mg daily, she reports she only has 1 kidney. Her Cr is 1.6 and shows a GFR of 31, this appears essentially at her baseline.   HTN -- BP performed today in office  and is WNL.  Outpatient Encounter Medications as of 12/25/2022  Medication Sig   acetaminophen (TYLENOL) 500 MG tablet Take 500-1,000 mg by mouth daily as needed for headache, fever, moderate pain or mild pain.   allopurinol (ZYLOPRIM) 100 MG tablet Take 1 tablet (100 mg total) by mouth daily. (Patient taking differently: Take 100 mg by mouth in the morning.)   amLODipine (NORVASC) 10 MG tablet Take 10 mg by mouth  in the morning.   Cholecalciferol (VITAMIN D3 SUPER STRENGTH) 50 MCG (2000 UT) CAPS Take 2,000 Units by mouth in the morning.   citalopram (CELEXA) 10 MG tablet Take 1 tablet (10 mg total) by mouth daily.   diclofenac Sodium (VOLTAREN) 1 % GEL Apply 2 g topically 4 (four) times daily.   dicyclomine (BENTYL) 10 MG capsule TAKE ONE CAPSULE BY MOUTH TWICE A DAY (Patient taking differently: Take 10 mg by mouth in the morning.)   ezetimibe (ZETIA) 10 MG tablet Take 10 mg by mouth in the morning.   furosemide (LASIX) 40 MG tablet Take 0.5 tablets (20 mg total) by mouth daily. (Patient taking differently: Take 40 mg by mouth in the morning.)   glipiZIDE (GLUCOTROL) 10 MG tablet Take 10 mg by mouth in the morning.   hydrocortisone (ANUSOL-HC) 25 MG suppository Place 25 mg rectally 2 (two) times daily as needed for hemorrhoids.   hydrocortisone 2.5 % cream Apply 1 Application topically 2 (two) times daily as needed (hemorrhoids).   hyoscyamine (LEVBID) 0.375 MG 12 hr tablet Take 0.375 mg by mouth 2 (two) times daily as needed for cramping.   levothyroxine (SYNTHROID) 75 MCG tablet Take 75 mcg by mouth daily before breakfast.   LINZESS 72 MCG capsule Take 72 mcg by mouth every morning.   metoprolol tartrate (LOPRESSOR) 100 MG tablet Take 1 tablet (  100 mg total) by mouth 2 (two) times daily.   nystatin (MYCOSTATIN) 100000 UNIT/ML suspension Use as directed 5 mLs in the mouth or throat 4 (four) times daily.   omeprazole (PRILOSEC) 20 MG capsule Take 1 capsule (20 mg total) by mouth 2 (two) times daily before a meal.   ondansetron (ZOFRAN) 4 MG tablet Take 1 tablet (4 mg total) by mouth every 6 (six) hours. (Patient taking differently: Take 4 mg by mouth every 6 (six) hours as needed for nausea or vomiting.)   QUEtiapine (SEROQUEL) 25 MG tablet Take 25 mg by mouth at bedtime.   vitamin E 180 MG (400 UNITS) capsule Take 400 Units by mouth in the morning.   No facility-administered encounter medications on file  as of 12/25/2022.    Past Medical History:  Diagnosis Date   Abnormal EKG 02/07/2016   Inferolateral T wave inversion and ST depression.   Acute calculous cholecystitis    Anxiety    Arthritis    Chronic diastolic CHF (congestive heart failure) ([place]) 02/21/2018   Colon polyps 11/2008   tubular adenoma   Diabetes mellitus    Esophageal problem    esophageal dilation   GERD (gastroesophageal reflux disease)    + hpylori   GI bleeding 01/2018   Headache    History of chicken pox    Hyperlipidemia    Hypertension    Hypothyroidism    Ischemic colitis [facility])    Renal cancer, right ([place]) 2010   s/p Rt nephrectomy by Dr. Rosana Hoes   Renal insufficiency     Past Surgical History:  Procedure Laterality Date   ABDOMINAL HYSTERECTOMY  1978   partial   BIOPSY  12/14/2022   Procedure: BIOPSY;  Surgeon: Yetta Flock, MD;  Location: [place];  Service: Gastroenterology;;   CATARACT EXTRACTION     CHOLECYSTECTOMY N/A 04/04/2016   Procedure: LAPAROSCOPIC CHOLECYSTECTOMY;  Surgeon: Autumn Messing III, MD;  Location: [place];  Service: General;  Laterality: N/A;   COLONOSCOPY     ESOPHAGEAL DILATION  2016   ESOPHAGOGASTRODUODENOSCOPY (EGD) WITH PROPOFOL N/A 12/14/2022   Procedure: ESOPHAGOGASTRODUODENOSCOPY (EGD) WITH PROPOFOL;  Surgeon: Yetta Flock, MD;  Location: [place];  Service: Gastroenterology;  Laterality: N/A;   HERNIA REPAIR     LAPAROSCOPIC CHOLECYSTECTOMY  04/04/2016   NEPHRECTOMY Right 1982   Dr. Rosana Hoes, urology, due to renal cancer    Family History  Problem Relation Age of Onset   Cancer Mother        colon, kidney cancer   Cancer Father        throat cancer   Suicidality Son    Cancer Sister    Heart attack Brother     Social History   Socioeconomic History   Marital status: Widowed    Spouse name: Not on file   Number of children: 2   Years of education: Not on file   Highest education level: Not on file  Occupational History   Occupation:  retired  Tobacco Use   Smoking status: Former    Packs/day: 0.50    Years: 55.00    Total pack years: 27.50    Types: Cigarettes    Quit date: 2023    Years since quitting: 1.0   Smokeless tobacco: Never  Vaping Use   Vaping Use: Never used  Substance and Sexual Activity   Alcohol use: No    Alcohol/week: 0.0 standard drinks of alcohol   Drug use: No   Sexual activity: Not  Currently  Other Topics Concern   Not on file  Social History Narrative   Loss of son.   Social Determinants of Health   Financial Resource Strain: Not on file  Food Insecurity: No Food Insecurity (12/13/2022)   Hunger Vital Sign    Worried About Running Out of Food in the Last Year: Never true    Ran Out of Food in the Last Year: Never true  Transportation Needs: No Transportation Needs (12/13/2022)   PRAPARE - Hydrologist (Medical): No    Lack of Transportation (Non-Medical): No  Physical Activity: Not on file  Stress: Not on file  Social Connections: Not on file  Intimate Partner Violence: Not At Risk (12/13/2022)   Humiliation, Afraid, Rape, and Kick questionnaire    Fear of Current or Ex-Partner: No    Emotionally Abused: No    Physically Abused: No    Sexually Abused: No    Review of Systems  All other systems reviewed and are negative.       Objective    BP 116/70 (BP Location: Left Arm, Patient Position: Sitting, Cuff Size: Large)   Pulse 70   Temp 98 F (36.7 C) (Oral)   Ht '5\' 5"'$  (1.651 m)   Wt 158 lb 6.4 oz (71.8 kg)   SpO2 99%   BMI 26.36 kg/m   Physical Exam Vitals reviewed.  Constitutional:      Appearance: Normal appearance. She is well-groomed and normal weight.  Eyes:     Conjunctiva/sclera: Conjunctivae normal.  Neck:     Thyroid: No thyromegaly.  Cardiovascular:     Rate and Rhythm: Normal rate and regular rhythm.     Pulses: Normal pulses.     Heart sounds: S1 normal and S2 normal.  Pulmonary:     Effort: Pulmonary effort is  normal.     Breath sounds: Normal breath sounds and air entry.  Abdominal:     General: Bowel sounds are normal.  Musculoskeletal:     Right lower leg: No edema.     Left lower leg: No edema.  Neurological:     Mental Status: She is alert and oriented to person, place, and time. Mental status is at baseline.     Gait: Gait is intact.  Psychiatric:        Mood and Affect: Mood and affect normal.        Speech: Speech normal.        Behavior: Behavior normal.        Judgment: Judgment normal.    Flowsheet Row Office Visit from 12/25/2022 in [place] at [facility]  PHQ-9 Total Score 18       Last CBC Lab Results  Component Value Date   WBC 6.1 12/15/2022   HGB 12.5 12/15/2022   HCT 37.4 12/15/2022   MCV 97.4 12/15/2022   MCH 32.6 12/15/2022   RDW 13.9 12/15/2022   PLT 168 13/24/4010   Last metabolic panel Lab Results  Component Value Date   GLUCOSE 117 (H) 12/15/2022   NA 140 12/15/2022   K 3.9 12/15/2022   CL 106 12/15/2022   CO2 26 12/15/2022   BUN 12 12/15/2022   CREATININE 1.61 (H) 12/15/2022   GFRNONAA 31 (L) 12/15/2022   CALCIUM 9.2 12/15/2022   PHOS 2.7 06/02/2020   PROT 7.4 12/12/2022   ALBUMIN 4.3 12/12/2022   LABGLOB 3.1 09/26/2020   AGRATIO 1.6 09/26/2020   BILITOT 0.8 12/12/2022   ALKPHOS  62 12/12/2022   AST 34 12/12/2022   ALT 45 (H) 12/12/2022   ANIONGAP 8 12/15/2022   Last lipids Lab Results  Component Value Date   CHOL 225 (H) 10/25/2022   HDL 51 10/25/2022   LDLCALC 134 (H) 10/25/2022   TRIG 258 (H) 10/25/2022   CHOLHDL 4.4 10/25/2022   Last hemoglobin A1c Lab Results  Component Value Date   HGBA1C 6.9 (H) 12/13/2022        Assessment & Plan:   Problem List Items Addressed This Visit       Unprioritized   Hypothyroidism    Reviewed last TSH which was WNL. Continue 75 mcg daily.       Relevant Medications   levothyroxine (SYNTHROID) 75 MCG tablet   Type 2 diabetes mellitus with diabetic  polyneuropathy, without long-term current use of insulin (HCC)    On glipizide 10 mg daily, last A1C was done in the hospital and was 6.9. will continue this medication as prescribed.       Relevant Medications   citalopram (CELEXA) 10 MG tablet   GAD (generalized anxiety disorder)    With depression symptoms, the patient's PHQ score is 18. We discussed adding citalopram 10 mg at bedtime and she is agreeable to this treatment. I will see her back short term in 3 months to re-evaluate her symptoms.      Relevant Medications   citalopram (CELEXA) 10 MG tablet   Primary osteoarthritis of right shoulder - Primary    Patient has been using diclofenac 1% gel, 2 grams QID, will call in refills for her.       Relevant Medications   diclofenac Sodium (VOLTAREN) 1 % GEL    Return in about 2 months (around 02/23/2023) for follow up anxiety symptoms.   Farrel Conners, MD

## 2022-12-25 NOTE — Patient Instructions (Signed)
Start the citalopram 10 mg daily at bedtime for the anxiety  Follow up with the surgeon to discuss hemorrhoid removal

## 2023-01-08 DIAGNOSIS — K648 Other hemorrhoids: Secondary | ICD-10-CM | POA: Diagnosis not present

## 2023-01-08 DIAGNOSIS — K2930 Chronic superficial gastritis without bleeding: Secondary | ICD-10-CM | POA: Diagnosis not present

## 2023-01-08 DIAGNOSIS — Z86010 Personal history of colonic polyps: Secondary | ICD-10-CM | POA: Diagnosis not present

## 2023-01-08 DIAGNOSIS — K5900 Constipation, unspecified: Secondary | ICD-10-CM | POA: Diagnosis not present

## 2023-01-30 ENCOUNTER — Telehealth: Payer: Self-pay

## 2023-01-30 NOTE — Telephone Encounter (Signed)
Contacted patient on preferred number listed In notes for scheduled office AWV. Patient stated that she canceled this visit and will call back to rescheule.

## 2023-02-04 ENCOUNTER — Telehealth: Payer: Self-pay | Admitting: Family Medicine

## 2023-02-04 NOTE — Telephone Encounter (Signed)
Contacted Sherri Burns to schedule their annual wellness visit. Call back at later date: patient will call back   [place] direct phone # [phone]   Spoke with patient to schedule AWV.  She stated she would have to call me back to schedule

## 2023-02-14 ENCOUNTER — Ambulatory Visit (INDEPENDENT_AMBULATORY_CARE_PROVIDER_SITE_OTHER): Payer: Medicare Other

## 2023-02-14 ENCOUNTER — Encounter (HOSPITAL_COMMUNITY): Payer: Self-pay | Admitting: Emergency Medicine

## 2023-02-14 ENCOUNTER — Ambulatory Visit (HOSPITAL_COMMUNITY)
Admission: EM | Admit: 2023-02-14 | Discharge: 2023-02-14 | Disposition: A | Discharge status: Home / Self Care | Payer: Medicare Other | Attending: Family Medicine | Admitting: Family Medicine

## 2023-02-14 DIAGNOSIS — M545 Low back pain, unspecified: Secondary | ICD-10-CM

## 2023-02-14 DIAGNOSIS — M25551 Pain in right hip: Secondary | ICD-10-CM

## 2023-02-14 DIAGNOSIS — R102 Pelvic and perineal pain: Secondary | ICD-10-CM | POA: Diagnosis not present

## 2023-02-14 DIAGNOSIS — R0781 Pleurodynia: Secondary | ICD-10-CM | POA: Diagnosis not present

## 2023-02-14 DIAGNOSIS — R0782 Intercostal pain: Secondary | ICD-10-CM

## 2023-02-14 DIAGNOSIS — R0789 Other chest pain: Secondary | ICD-10-CM

## 2023-02-14 MED ORDER — HYDROCODONE-ACETAMINOPHEN 5-325 MG PO TABS: 1.0000 | ORAL_TABLET | Freq: Four times a day (QID) | ORAL | 0 refills | Status: DC | PRN

## 2023-02-14 NOTE — ED Triage Notes (Signed)
Pt reports fell about 2 weeks ago and c/o right lower back pains since. Took tylenol

## 2023-02-14 NOTE — ED Provider Notes (Signed)
[place]    CSN: [id number] Arrival date & time: 02/14/23  1957      History   Chief Complaint Chief Complaint  Patient presents with   Back Pain    HPI Sherri Burns is a 87 y.o. female.    Back Pain  For pain in her right lower posterior chest and in her right low back and her LS area right around her iliac crest.  2 to 3 weeks ago she lost her balance because she was wearing some shoes that were too wide for her and fell onto her right buttock and side.  Since then she hurts in her right lower posterior chest and the posterior axillary line and also around the right lumbosacral area laterally, more around her iliac crest on the right. She did not hit her head and no loss of consciousness  No cough or fever.  Chart states that she has hallucinations with codeine.  It looks like she has tolerated hydrocodone in the past.  She is uncertain if she is tolerated tramadol or if it gave her any benefit when she is taking it in the past   Past Medical History:  Diagnosis Date   Abnormal EKG 02/07/2016   Inferolateral T wave inversion and ST depression.   Acute calculous cholecystitis    Anxiety    Arthritis    Chronic diastolic CHF (congestive heart failure) ([place]) 02/21/2018   Colon polyps 11/2008   tubular adenoma   Diabetes mellitus    Esophageal problem    esophageal dilation   GERD (gastroesophageal reflux disease)    + hpylori   GI bleeding 01/2018   Headache    History of chicken pox    Hyperlipidemia    Hypertension    Hypothyroidism    Ischemic colitis ([place])    Renal cancer, right ([place]) 2010   s/p Rt nephrectomy by Dr. Rosana Hoes   Renal insufficiency     Patient Active Problem List   Diagnosis Date Noted   Primary osteoarthritis of right shoulder 12/25/2022   Gastritis and gastroduodenitis 12/14/2022   Gastric polyps 12/14/2022   Acute upper GI bleeding 12/13/2022   Hemorrhoids 12/13/2022   Thrush of mouth and esophagus ([place])  12/13/2022   Hematemesis 12/13/2022   Grief 06/19/2022   Acute GI bleeding 07/13/2021   Lower abdominal pain 04/18/2021   Pressure injury of right heel, unstageable ([place]) 12/06/2020   Lower GI bleed 05/31/2020   Statin intolerance 11/12/2019   GAD (generalized anxiety disorder) 08/07/2019   Long term prescription benzodiazepine use 08/07/2019   GI bleed 04/10/2019   S/p nephrectomy 06/24/2018   Chronic diastolic CHF (congestive heart failure) ([place]) 02/21/2018   Acute lower GI bleeding 02/20/2018   Hyperlipidemia    Abnormal EKG 02/07/2016   Type 2 diabetes mellitus with diabetic polyneuropathy, without long-term current use of insulin ([place]) 09/23/2012   Essential hypertension    Gastroesophageal reflux disease without esophagitis    Hypothyroidism    CKD (chronic kidney disease), stage III [facility])     Past Surgical History:  Procedure Laterality Date   ABDOMINAL HYSTERECTOMY  1978   partial   BIOPSY  12/14/2022   Procedure: BIOPSY;  Surgeon: Yetta Flock, MD;  Location: [place];  Service: Gastroenterology;;   CATARACT EXTRACTION     CHOLECYSTECTOMY N/A 04/04/2016   Procedure: LAPAROSCOPIC CHOLECYSTECTOMY;  Surgeon: Autumn Messing III, MD;  Location: [place];  Service: General;  Laterality: N/A;   COLONOSCOPY  ESOPHAGEAL DILATION  2016   ESOPHAGOGASTRODUODENOSCOPY (EGD) WITH PROPOFOL N/A 12/14/2022   Procedure: ESOPHAGOGASTRODUODENOSCOPY (EGD) WITH PROPOFOL;  Surgeon: Yetta Flock, MD;  Location: [place];  Service: Gastroenterology;  Laterality: N/A;   HERNIA REPAIR     LAPAROSCOPIC CHOLECYSTECTOMY  04/04/2016   NEPHRECTOMY Right 1982   Dr. Rosana Hoes, urology, due to renal cancer    OB History   No obstetric history on file.      Home Medications    Prior to Admission medications   Medication Sig Start Date End Date Taking? Authorizing Provider  HYDROcodone-acetaminophen (NORCO/VICODIN) 5-325 MG tablet Take 1 tablet by mouth every 6 (six) hours as  needed (pain). 02/14/23  Yes Barrett Henle, MD  acetaminophen (TYLENOL) 500 MG tablet Take 500-1,000 mg by mouth daily as needed for headache, fever, moderate pain or mild pain.    [provider]  allopurinol (ZYLOPRIM) 100 MG tablet Take 1 tablet (100 mg total) by mouth daily. Patient taking differently: Take 100 mg by mouth in the morning. 11/11/20   Trula Slade, DPM  amLODipine (NORVASC) 10 MG tablet Take 10 mg by mouth in the morning.    [provider]  Cholecalciferol (VITAMIN D3 SUPER STRENGTH) 50 MCG (2000 UT) CAPS Take 2,000 Units by mouth in the morning.    [provider]  citalopram (CELEXA) 10 MG tablet Take 1 tablet (10 mg total) by mouth daily. 12/25/22   Farrel Conners, MD  diclofenac Sodium (VOLTAREN) 1 % GEL Apply 2 g topically 4 (four) times daily. 12/25/22   Farrel Conners, MD  dicyclomine (BENTYL) 10 MG capsule TAKE ONE CAPSULE BY MOUTH TWICE A DAY Patient taking differently: Take 10 mg by mouth in the morning. 01/31/22   Zehr, Laban Emperor, PA-C  ezetimibe (ZETIA) 10 MG tablet Take 10 mg by mouth in the morning. 05/25/22   [provider]  furosemide (LASIX) 40 MG tablet Take 0.5 tablets (20 mg total) by mouth daily. Patient taking differently: Take 40 mg by mouth in the morning. 08/08/20   Daleen Squibb, MD  glipiZIDE (GLUCOTROL) 10 MG tablet Take 10 mg by mouth in the morning.    [provider]  hydrocortisone (ANUSOL-HC) 25 MG suppository Place 25 mg rectally 2 (two) times daily as needed for hemorrhoids.    [provider]  hydrocortisone 2.5 % cream Apply 1 Application topically 2 (two) times daily as needed (hemorrhoids).    [provider]  hyoscyamine (LEVBID) 0.375 MG 12 hr tablet Take 0.375 mg by mouth 2 (two) times daily as needed for cramping. 08/07/22   [provider]  levothyroxine (SYNTHROID) 75 MCG tablet Take 75 mcg by mouth daily before breakfast.    [provider]  LINZESS 72 MCG capsule Take 72 mcg by mouth every morning. 11/27/22   [provider]  metoprolol tartrate (LOPRESSOR) 100 MG tablet Take 1 tablet (100 mg total) by mouth 2 (two) times daily. 06/16/20   Jacelyn Pi, Lilia Argue, MD  nystatin (MYCOSTATIN) 100000 UNIT/ML suspension Use as directed 5 mLs in the mouth or throat 4 (four) times daily.    [provider]  omeprazole (PRILOSEC) 20 MG capsule Take 1 capsule (20 mg total) by mouth 2 (two) times daily before a meal. 12/15/22   Dwyane Dee, MD  ondansetron (ZOFRAN) 4 MG tablet Take 1 tablet (4 mg total) by mouth every 6 (six) hours. Patient taking differently: Take 4 mg by mouth every 6 (  six) hours as needed for nausea or vomiting. 11/23/22   Sherrye Payor A, PA-C  QUEtiapine (SEROQUEL) 25 MG tablet Take 25 mg by mouth at bedtime. 05/25/22   [provider]  vitamin E 180 MG (400 UNITS) capsule Take 400 Units by mouth in the morning.    [provider]    Family History Family History  Problem Relation Age of Onset   Cancer Mother        colon, kidney cancer   Cancer Father        throat cancer   Suicidality Son    Cancer Sister    Heart attack Brother     Social History Social History   Tobacco Use   Smoking status: Former    Packs/day: 0.50    Years: 55.00    Additional pack years: 0.00    Total pack years: 27.50    Types: Cigarettes    Quit date: 2023    Years since quitting: 1.2   Smokeless tobacco: Never  Vaping Use   Vaping Use: Never used  Substance Use Topics   Alcohol use: No    Alcohol/week: 0.0 standard drinks of alcohol   Drug use: No     Allergies   Ace inhibitors, Codeine, Januvia [sitagliptin], Metformin and related, Tessalon perles [benzonatate], Ciprocin-fluocin-procin [fluocinolone acetonide], Clonidine derivatives, Crestor [rosuvastatin calcium], Penicillins, and Zocor [simvastatin]   Review of Systems Review of Systems  Musculoskeletal:   Positive for back pain.     Physical Exam Triage Vital Signs ED Triage Vitals [02/14/23 2014]  Enc Vitals Group     BP (!) 154/79     Pulse Rate 73     Resp 18     Temp 98.8 F (37.1 C)     Temp Source Oral     SpO2 98 %     Weight      Height      Head Circumference      Peak Flow      Pain Score 10     Pain Loc      Pain Edu?      Excl. in [place]?    No data found.  Updated Vital Signs BP (!) 154/79 (BP Location: Left Arm)   Pulse 73   Temp 98.8 F (37.1 C) (Oral)   Resp 18   SpO2 98%   Visual Acuity Right Eye Distance:   Left Eye Distance:   Bilateral Distance:    Right Eye Near:   Left Eye Near:    Bilateral Near:     Physical Exam Vitals reviewed.  Constitutional:      General: She is not in acute distress.    Appearance: She is not ill-appearing, toxic-appearing or diaphoretic.  HENT:     Mouth/Throat:     Mouth: Mucous membranes are moist.  Eyes:     Extraocular Movements: Extraocular movements intact.     Conjunctiva/sclera: Conjunctivae normal.     Pupils: Pupils are equal, round, and reactive to light.  Cardiovascular:     Rate and Rhythm: Normal rate and regular rhythm.     Heart sounds: No murmur heard. Pulmonary:     Effort: Pulmonary effort is normal.     Breath sounds: Normal breath sounds.  Chest:     Chest wall: Tenderness (right posterior axillary line near costal margin) present.  Musculoskeletal:     Cervical back: Neck supple.     Comments: There is also tenderness of right LS area laterally,  along iliac crest  Lymphadenopathy:     Cervical: No cervical adenopathy.  Skin:    Coloration: Skin is not jaundiced or pale.  Neurological:     General: No focal deficit present.     Mental Status: She is alert and oriented to person, place, and time.  Psychiatric:        Behavior: Behavior normal.      UC Treatments / Results  Labs (all labs ordered are listed, but only abnormal results are displayed) Labs Reviewed - No data to  display  EKG   Radiology DG Pelvis 1-2 Views  Result Date: 02/14/2023 CLINICAL DATA:  Fall, right side pain EXAM: PELVIS - 1-2 VIEW COMPARISON:  None Available. FINDINGS: Early osteoarthritic changes in the hip joints bilaterally with joint space narrowing. No acute bony abnormality. Specifically, no fracture, subluxation, or dislocation. IMPRESSION: No acute bony abnormality. Electronically Signed   By: Rolm Baptise M.D.   On: 02/14/2023 20:38   DG Ribs Unilateral W/Chest Right  Result Date: 02/14/2023 CLINICAL DATA:  Right lower rib pain EXAM: RIGHT RIBS AND CHEST - 3+ VIEW COMPARISON:  06/19/2022 FINDINGS: Heart and mediastinal contours within normal limits. Tortuous, calcified aorta. Lungs clear. No effusions or pneumothorax. No acute bony abnormality. No visible displaced rib fracture. IMPRESSION: No acute cardiopulmonary disease. No visible rib fracture Electronically Signed   By: Rolm Baptise M.D.   On: 02/14/2023 20:37    Procedures Procedures (including critical care time)  Medications Ordered in UC Medications - No data to display  Initial Impression / Assessment and Plan / UC Course  I have reviewed the triage vital signs and the nursing notes.  Pertinent labs & imaging results that were available during my care of the patient were reviewed by me and considered in my medical decision making (see chart for details).       Creatinine was 1.6 and EGFR was in the 30s when last done, earlier this year; he is also had a history of a GI bleed   X-rays are negative for fractures.  There are some degenerative changes.  Results reviewed with the patient who was shocked that she does not have a fracture.  Hydrocodone sent in for pain relief.  I have asked her to please follow-up with her primary care for further recommendations and evaluation. She has chronic renal insufficiency and has had a history of a GI bleed, therefore cannot use oral NSAIDs for pain relief.  The hydrocodone is  sent in as she remembers tolerating that in the past.  PMP does not have any narcotic fills at all Final Clinical Impressions(s) / UC Diagnoses   Final diagnoses:  Rib pain on right side  Acute right-sided low back pain without sciatica  Right hip pain     Discharge Instructions      The x-rays did not show any broken bones.  There were signs of arthritis in both x-rays.  Hydrocodone 5 mg--1 tablet every 6 hours as needed for pain.  This is best taken with food.  It can cause sleepiness or dizziness  Please call your primary care doctor tomorrow so they can help you figure out when you need to follow-up with them so they can evaluate you further     ED Prescriptions     Medication Sig Dispense Auth. Provider   HYDROcodone-acetaminophen (NORCO/VICODIN) 5-325 MG tablet Take 1 tablet by mouth every 6 (six) hours as needed (pain). 12 tablet Windy Carina, Gwenlyn Perking, MD  I have reviewed the PDMP during this encounter.   Barrett Henle, MD 02/14/23 2046

## 2023-02-14 NOTE — Discharge Instructions (Signed)
The x-rays did not show any broken bones.  There were signs of arthritis in both x-rays.  Hydrocodone 5 mg--1 tablet every 6 hours as needed for pain.  This is best taken with food.  It can cause sleepiness or dizziness  Please call your primary care doctor tomorrow so they can help you figure out when you need to follow-up with them so they can evaluate you further

## 2023-02-26 ENCOUNTER — Ambulatory Visit: Payer: Medicare Other | Admitting: Family Medicine

## 2023-03-27 ENCOUNTER — Telehealth: Payer: Self-pay | Admitting: Family Medicine

## 2023-03-27 NOTE — Telephone Encounter (Signed)
Called patient to schedule Medicare Annual Wellness Visit (AWV). Left message for patient to call back and schedule Medicare Annual Wellness Visit (AWV).  Last date of AWV: 10/19/19  Please schedule an appointment at any time with [facility] or [employer].  If any questions, please contact me at [phone].  Thank you ,  Rudell Cobb AWV direct phone # [phone]

## 2023-04-02 ENCOUNTER — Encounter: Payer: Self-pay | Admitting: Family Medicine

## 2023-04-02 ENCOUNTER — Ambulatory Visit (INDEPENDENT_AMBULATORY_CARE_PROVIDER_SITE_OTHER): Payer: Medicare Other | Admitting: Family Medicine

## 2023-04-02 VITALS — BP 118/80 | HR 65 | Temp 98.4°F | Ht 65.0 in | Wt 161.9 lb

## 2023-04-02 DIAGNOSIS — G8929 Other chronic pain: Secondary | ICD-10-CM

## 2023-04-02 DIAGNOSIS — L403 Pustulosis palmaris et plantaris: Secondary | ICD-10-CM | POA: Diagnosis not present

## 2023-04-02 DIAGNOSIS — M549 Dorsalgia, unspecified: Secondary | ICD-10-CM

## 2023-04-02 DIAGNOSIS — F411 Generalized anxiety disorder: Secondary | ICD-10-CM | POA: Diagnosis not present

## 2023-04-02 MED ORDER — TRAMADOL HCL 50 MG PO TABS: 50.0000 mg | ORAL_TABLET | Freq: Three times a day (TID) | ORAL | 0 refills | Status: AC | PRN

## 2023-04-02 MED ORDER — CLOBETASOL PROPIONATE 0.05 % EX OINT: 1.0000 | TOPICAL_OINTMENT | Freq: Two times a day (BID) | CUTANEOUS | 2 refills | Status: DC

## 2023-04-02 MED ORDER — QUETIAPINE FUMARATE 50 MG PO TABS: 25.0000 mg | ORAL_TABLET | Freq: Every day | ORAL | 1 refills | Status: AC

## 2023-04-02 MED ORDER — CITALOPRAM HYDROBROMIDE 20 MG PO TABS: 20.0000 mg | ORAL_TABLET | Freq: Every day | ORAL | 1 refills | Status: DC

## 2023-04-02 NOTE — Progress Notes (Signed)
Established Patient Office Visit  Subjective   Patient ID: Sherri Burns, female    DOB: 1935/12/30  Age: 87 y.o. MRN: [id number]  Chief Complaint  Patient presents with   Medical Management of Chronic Issues   Rash    Patient complains of "flaky skin" bilateral feet x3 weeks, red spots on the right foot    Patient is here for medication refills. And also she is reporting a rash on her feet for the last 3 weeks, reports that she has been soaking her feet and putting on a "healing cream" for diabetics in an effort to treat the rash. States that the skin is very dry, the rash is painful when it is "flared up" she has flaking skin and also red spots on the bottoms of her feet. Not itching.  States that it has not improved over the las\t 3 weeks.   Pt reports that she suffered a fall in March, states that she had onset of right sided rib and hip pain. Was seen in the urgent care and given hydrocodone, x-rays were negative for acute findings. Patient is asking for a refill of the hydrocodone. She reports chronic back and hip pain from her arthritis. States she has been using diclofenac gel without any improvement of her pain. We discussed that this medication is higher risk and is not recommended for the use of chronic non cancer pain.   Rash This is a new problem. The current episode started 1 to 4 weeks ago. The problem is unchanged. The affected locations include the right foot and left foot. The rash is characterized by dryness, peeling and scaling. She was exposed to nothing. Pertinent negatives include no shortness of breath or sore throat. Past treatments include moisturizer. The treatment provided no relief.    Current Outpatient Medications  Medication Instructions   acetaminophen (TYLENOL) 500-1,000 mg, Oral, Daily PRN   allopurinol (ZYLOPRIM) 100 mg, Oral, Daily   amLODipine (NORVASC) 10 mg, Oral, Every morning   citalopram (CELEXA) 20 mg, Oral, Daily   clobetasol ointment  (TEMOVATE) 0.05 % 1 Application, Topical, 2 times daily, Use the ointment for 14 days, then stop. May restart if the skin condition returns.   diclofenac Sodium (VOLTAREN) 2 g, Topical, 4 times daily   dicyclomine (BENTYL) 10 MG capsule TAKE ONE CAPSULE BY MOUTH TWICE A DAY   ezetimibe (ZETIA) 10 mg, Oral, Every morning   furosemide (LASIX) 20 mg, Oral, Daily   glipiZIDE (GLUCOTROL) 10 mg, Oral, Every morning   hydrocortisone (ANUSOL-HC) 25 mg, Rectal, 2 times daily PRN   hydrocortisone 2.5 % cream 1 Application, Topical, 2 times daily PRN   hyoscyamine (LEVBID) 0.375 mg, Oral, 2 times daily PRN   levothyroxine (SYNTHROID) 75 mcg, Oral, Daily before breakfast   Linzess 72 mcg, Oral, Every morning   metoprolol tartrate (LOPRESSOR) 100 mg, Oral, 2 times daily   nystatin (MYCOSTATIN) 100000 UNIT/ML suspension 5 mLs, Mouth/Throat, 4 times daily   omeprazole (PRILOSEC) 20 mg, Oral, 2 times daily before meals   ondansetron (ZOFRAN) 4 mg, Oral, Every 6 hours   QUEtiapine (SEROQUEL) 25 mg, Oral, Daily at bedtime   traMADol (ULTRAM) 50 mg, Oral, Every 8 hours PRN   Vitamin D3 Super Strength 2,000 Units, Oral, Every morning   vitamin E (VITAMIN E) 400 Units, Oral, Every morning    Patient Active Problem List   Diagnosis Date Noted   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   Primary osteoarthritis of right shoulder 12/25/2022   Gastritis  and gastroduodenitis 12/14/2022   Gastric polyps 12/14/2022   Acute upper GI bleeding 12/13/2022   Hemorrhoids 12/13/2022   Thrush of mouth and esophagus (HCC) 12/13/2022   Hematemesis 12/13/2022   Grief 06/19/2022   Acute GI bleeding 07/13/2021   Lower abdominal pain 04/18/2021   Pressure injury of right heel, unstageable (HCC) 12/06/2020   Lower GI bleed 05/31/2020   Statin intolerance 11/12/2019   GAD (generalized anxiety disorder) 08/07/2019   Long term prescription benzodiazepine use 08/07/2019   GI bleed 04/10/2019   S/p nephrectomy 06/24/2018    Chronic diastolic CHF (congestive heart failure) (HCC) 02/21/2018   Acute lower GI bleeding 02/20/2018   Hyperlipidemia    Abnormal EKG 02/07/2016   Type 2 diabetes mellitus with diabetic polyneuropathy, without long-term current use of insulin (HCC) 09/23/2012   Essential hypertension    Gastroesophageal reflux disease without esophagitis    Hypothyroidism    CKD (chronic kidney disease), stage III (HCC)       Review of Systems  HENT:  Negative for sore throat.   Respiratory:  Negative for shortness of breath.   Skin:  Positive for rash.  All other systems reviewed and are negative.     Objective:     BP 118/80 (BP Location: Left Arm, Patient Position: Sitting, Cuff Size: Normal)   Pulse 65   Temp 98.4 F (36.9 C) (Oral)   Ht 5\' 5"  (1.651 m)   Wt 161 lb 14.4 oz (73.4 kg)   SpO2 97%   BMI 26.94 kg/m  {Vitals History (Optional):23777}  Physical Exam Vitals reviewed.  Constitutional:      Appearance: Normal appearance. She is well-groomed and normal weight.  Eyes:     Conjunctiva/sclera: Conjunctivae normal.  Neck:     Thyroid: No thyromegaly.  Cardiovascular:     Rate and Rhythm: Normal rate and regular rhythm.     Pulses: Normal pulses.     Heart sounds: S1 normal and S2 normal.  Pulmonary:     Effort: Pulmonary effort is normal.     Breath sounds: Normal breath sounds and air entry.  Abdominal:     General: Bowel sounds are normal.  Musculoskeletal:     Right lower leg: No edema.     Left lower leg: No edema.  Neurological:     Mental Status: She is alert and oriented to person, place, and time. Mental status is at baseline.     Gait: Gait is intact.  Psychiatric:        Mood and Affect: Mood and affect normal.        Speech: Speech normal.        Behavior: Behavior normal.        Judgment: Judgment normal.      No results found for any visits on 04/02/23.  Last metabolic panel Lab Results  Component Value Date   GLUCOSE 117 (H) 12/15/2022   NA  140 12/15/2022   K 3.9 12/15/2022   CL 106 12/15/2022   CO2 26 12/15/2022   BUN 12 12/15/2022   CREATININE 1.61 (H) 12/15/2022   GFRNONAA 31 (L) 12/15/2022   CALCIUM 9.2 12/15/2022   PHOS 2.7 06/02/2020   PROT 7.4 12/12/2022   ALBUMIN 4.3 12/12/2022   LABGLOB 3.1 09/26/2020   AGRATIO 1.6 09/26/2020   BILITOT 0.8 12/12/2022   ALKPHOS 62 12/12/2022   AST 34 12/12/2022   ALT 45 (H) 12/12/2022   ANIONGAP 8 12/15/2022   Last hemoglobin A1c Lab Results  Component  Value Date   HGBA1C 6.9 (H) 12/13/2022      The ASCVD Risk score (Arnett DK, et al., 2019) failed to calculate for the following reasons:   The 2019 ASCVD risk score is only valid for ages 77 to 27    Assessment & Plan:  Palmoplantar pustulosis -     Clobetasol Propionate; Apply 1 Application topically 2 (two) times daily. Use the ointment for 14 days, then stop. May restart if the skin condition returns.  Dispense: 60 g; Refill: 2  GAD (generalized anxiety disorder) -     Citalopram Hydrobromide; Take 1 tablet (20 mg total) by mouth daily.  Dispense: 90 tablet; Refill: 1 -     QUEtiapine Fumarate; Take 0.5 tablets (25 mg total) by mouth at bedtime.  Dispense: 90 tablet; Refill: 1  Chronic back pain greater than 3 months duration -     traMADol HCl; Take 1 tablet (50 mg total) by mouth every 8 (eight) hours as needed for up to 7 days.  Dispense: 21 tablet; Refill: 0     Return in about 3 months (around 07/03/2023) for DM.    Karie Georges, MD

## 2023-04-02 NOTE — Patient Instructions (Signed)
Use moisturizers daily on feet, no need to soak the feet.

## 2023-04-03 DIAGNOSIS — G8929 Other chronic pain: Secondary | ICD-10-CM | POA: Insufficient documentation

## 2023-04-03 NOTE — Assessment & Plan Note (Signed)
Pt also had spots on her palms as well. We will start clobetasol oointment on the patient BID. I advised the patient to stop soaking her feet as this can make it worse.

## 2023-04-03 NOTE — Assessment & Plan Note (Signed)
Patient reports chronic back and joint pain. She cannot take  NSAIDS due to CKD stage 3, however we had a long discussion about using opioid medications in the treatment of non cancer pain and the risks associated with these medications including dependency, overdose and withdrawal. I recommended starting tramadol 50 mg as needed for pain. I gave her a 7 day supply and will refill this if is works for her.

## 2023-04-03 NOTE — Assessment & Plan Note (Signed)
Symptoms are not well controlled, will increase the seroquel to 1 tablet daily at bedtime and increase citalopram to 20 mg daily. RTC in 3 months.

## 2023-04-09 DIAGNOSIS — K648 Other hemorrhoids: Secondary | ICD-10-CM | POA: Diagnosis not present

## 2023-04-09 DIAGNOSIS — R1030 Lower abdominal pain, unspecified: Secondary | ICD-10-CM | POA: Diagnosis not present

## 2023-04-11 ENCOUNTER — Other Ambulatory Visit: Payer: Self-pay | Admitting: Family Medicine

## 2023-05-14 DIAGNOSIS — H04123 Dry eye syndrome of bilateral lacrimal glands: Secondary | ICD-10-CM | POA: Diagnosis not present

## 2023-05-14 DIAGNOSIS — E113292 Type 2 diabetes mellitus with mild nonproliferative diabetic retinopathy without macular edema, left eye: Secondary | ICD-10-CM | POA: Diagnosis not present

## 2023-05-14 DIAGNOSIS — H40023 Open angle with borderline findings, high risk, bilateral: Secondary | ICD-10-CM | POA: Diagnosis not present

## 2023-06-12 ENCOUNTER — Other Ambulatory Visit: Payer: Self-pay | Admitting: Family Medicine

## 2023-06-12 DIAGNOSIS — F411 Generalized anxiety disorder: Secondary | ICD-10-CM

## 2023-07-02 ENCOUNTER — Ambulatory Visit: Payer: Medicare Other | Admitting: Family Medicine

## 2023-08-26 ENCOUNTER — Other Ambulatory Visit: Payer: Self-pay | Admitting: Nurse Practitioner

## 2023-08-26 ENCOUNTER — Encounter: Payer: Self-pay | Admitting: Nurse Practitioner

## 2023-08-26 DIAGNOSIS — R109 Unspecified abdominal pain: Secondary | ICD-10-CM

## 2023-10-25 ENCOUNTER — Other Ambulatory Visit: Payer: Self-pay

## 2023-10-25 ENCOUNTER — Encounter (HOSPITAL_COMMUNITY): Payer: Self-pay | Admitting: Emergency Medicine

## 2023-10-25 ENCOUNTER — Emergency Department (HOSPITAL_COMMUNITY)
Admission: EM | Admit: 2023-10-25 | Discharge: 2023-10-26 | Disposition: A | Discharge status: Home / Self Care | Payer: Medicare Other | Attending: Emergency Medicine | Admitting: Emergency Medicine

## 2023-10-25 DIAGNOSIS — K625 Hemorrhage of anus and rectum: Secondary | ICD-10-CM

## 2023-10-25 DIAGNOSIS — E119 Type 2 diabetes mellitus without complications: Secondary | ICD-10-CM | POA: Diagnosis not present

## 2023-10-25 DIAGNOSIS — I509 Heart failure, unspecified: Secondary | ICD-10-CM | POA: Insufficient documentation

## 2023-10-25 DIAGNOSIS — Z79899 Other long term (current) drug therapy: Secondary | ICD-10-CM | POA: Insufficient documentation

## 2023-10-25 DIAGNOSIS — I110 Hypertensive heart disease with heart failure: Secondary | ICD-10-CM | POA: Insufficient documentation

## 2023-10-25 DIAGNOSIS — Z7984 Long term (current) use of oral hypoglycemic drugs: Secondary | ICD-10-CM | POA: Diagnosis not present

## 2023-10-25 DIAGNOSIS — K644 Residual hemorrhoidal skin tags: Secondary | ICD-10-CM | POA: Insufficient documentation

## 2023-10-25 DIAGNOSIS — M549 Dorsalgia, unspecified: Secondary | ICD-10-CM | POA: Diagnosis not present

## 2023-10-25 DIAGNOSIS — R1030 Lower abdominal pain, unspecified: Secondary | ICD-10-CM | POA: Diagnosis not present

## 2023-10-25 LAB — URINALYSIS, W/ REFLEX TO CULTURE (INFECTION SUSPECTED)
Bacteria, UA: NONE SEEN
Bilirubin Urine: NEGATIVE
Glucose, UA: NEGATIVE mg/dL
Ketones, ur: NEGATIVE mg/dL
Leukocytes,Ua: NEGATIVE
Nitrite: NEGATIVE
Protein, ur: NEGATIVE mg/dL
Specific Gravity, Urine: 1.01 (ref 1.005–1.030)
pH: 5 (ref 5.0–8.0)

## 2023-10-25 LAB — CBC WITH DIFFERENTIAL/PLATELET
Abs Immature Granulocytes: 0.05 10*3/uL (ref 0.00–0.07)
Basophils Absolute: 0.1 10*3/uL (ref 0.0–0.1)
Basophils Relative: 1 %
Eosinophils Absolute: 0.1 10*3/uL (ref 0.0–0.5)
Eosinophils Relative: 2 %
HCT: 38.9 % (ref 36.0–46.0)
Hemoglobin: 13.4 g/dL (ref 12.0–15.0)
Immature Granulocytes: 1 %
Lymphocytes Relative: 28 %
Lymphs Abs: 2 10*3/uL (ref 0.7–4.0)
MCH: 34.6 pg — ABNORMAL HIGH (ref 26.0–34.0)
MCHC: 34.4 g/dL (ref 30.0–36.0)
MCV: 100.5 fL — ABNORMAL HIGH (ref 80.0–100.0)
Monocytes Absolute: 0.6 10*3/uL (ref 0.1–1.0)
Monocytes Relative: 8 %
Neutro Abs: 4.4 10*3/uL (ref 1.7–7.7)
Neutrophils Relative %: 60 %
Platelets: 220 10*3/uL (ref 150–400)
RBC: 3.87 MIL/uL (ref 3.87–5.11)
RDW: 14.3 % (ref 11.5–15.5)
WBC: 7.2 10*3/uL (ref 4.0–10.5)
nRBC: 0 % (ref 0.0–0.2)

## 2023-10-25 LAB — COMPREHENSIVE METABOLIC PANEL
ALT: 13 U/L (ref 0–44)
AST: 28 U/L (ref 15–41)
Albumin: 4 g/dL (ref 3.5–5.0)
Alkaline Phosphatase: 70 U/L (ref 38–126)
Anion gap: 13 (ref 5–15)
BUN: 21 mg/dL (ref 8–23)
CO2: 19 mmol/L — ABNORMAL LOW (ref 22–32)
Calcium: 9.7 mg/dL (ref 8.9–10.3)
Chloride: 106 mmol/L (ref 98–111)
Creatinine, Ser: 1.37 mg/dL — ABNORMAL HIGH (ref 0.44–1.00)
GFR, Estimated: 38 mL/min — ABNORMAL LOW (ref >60)
Glucose, Bld: 234 mg/dL — ABNORMAL HIGH (ref 70–99)
Potassium: 5.1 mmol/L (ref 3.5–5.1)
Sodium: 138 mmol/L (ref 135–145)
Total Bilirubin: 1.2 mg/dL — ABNORMAL HIGH (ref <1.2)
Total Protein: 6.9 g/dL (ref 6.5–8.1)

## 2023-10-25 LAB — LIPASE, BLOOD: Lipase: 38 U/L (ref 11–51)

## 2023-10-25 NOTE — ED Triage Notes (Signed)
Pt BIB EMS from home for bright red blood in stool that started about 30 minutes ago. Hx of hemorrhoids. Has not happened in about a year. Reports GI hx but unable to state what that hx is. Also c/o pain and swelling to R clavicle for over a year. Pt with multiple complaints in triage, abd pain, back pain, BIL leg pain, difficulty urinating, rash on both feet that she uses cream for. States that she was supposed to have an appt with PCP today and did not go because of her pain.  EMS VS 150/88 HR 98 99% RA CBG 282

## 2023-10-25 NOTE — ED Provider Notes (Signed)
[place] EMERGENCY DEPARTMENT AT [facility] Provider Note   CSN: [id number] Arrival date & time: 10/25/23  2126     History {Add pertinent medical, surgical, social history, OB history to HPI:1} Chief Complaint  Patient presents with   Rectal Bleeding    Sherri Burns is a 87 y.o. female.  The history is provided by the patient and medical records.  Rectal Bleeding  87 y.o. F with hx of HTN, HLP, DM, renal insufficiency due to single kidney, GERD, ischemic colitis, CHF, hx of recurrent GI bleeding, presenting to the ED with blood in stools.  Patient reports this has been an ongoing issues for several years now.  She has been seen by GI as well as colorectal surgery.  Last colonscopy 2020 and found to have internal and external hemorrhoids.  Due to see a new specialist at [facility] 11/06/23.  She reports she has been having trouble moving her bowels recently which has caused increased bleeding.  States today after trying to have BM she had a lot of BRBPR, no clots intermixed.  She does report some lower abdominal pain.  No fever/chills.  No nausea/vomiting but appetite has overall been poor.  She was supposed to see her PCP today for these issues, however she was having pain so she did not go.  Also has chronic leg and back aches/pains which are unchanged from baseline.  No new falls/trauma.  She is not on anticoagulation.  Home Medications Prior to Admission medications   Medication Sig Start Date End Date Taking? Authorizing Provider  acetaminophen (TYLENOL) 500 MG tablet Take 500-1,000 mg by mouth daily as needed for headache, fever, moderate pain or mild pain.    [provider]  allopurinol (ZYLOPRIM) 100 MG tablet Take 1 tablet (100 mg total) by mouth daily. Patient taking differently: Take 100 mg by mouth in the morning. 11/11/20   Vivi Barrack, DPM  amLODipine (NORVASC) 10 MG tablet Take 10 mg by mouth in the morning.    [provider]   Cholecalciferol (VITAMIN D3 SUPER STRENGTH) 50 MCG (2000 UT) CAPS Take 2,000 Units by mouth in the morning.    [provider]  citalopram (CELEXA) 20 MG tablet TAKE 1 TABLET (20 MG TOTAL) BY MOUTH DAILY 06/12/23   Karie Georges, MD  clobetasol ointment (TEMOVATE) 0.05 % Apply 1 Application topically 2 (two) times daily. Use the ointment for 14 days, then stop. May restart if the skin condition returns. 04/02/23   Karie Georges, MD  diclofenac Sodium (VOLTAREN) 1 % GEL Apply 2 g topically 4 (four) times daily. 12/25/22   Karie Georges, MD  dicyclomine (BENTYL) 10 MG capsule TAKE ONE CAPSULE BY MOUTH TWICE A DAY Patient taking differently: Take 10 mg by mouth in the morning. 01/31/22   Zehr, Princella Pellegrini, PA-C  ezetimibe (ZETIA) 10 MG tablet Take 10 mg by mouth in the morning. 05/25/22   [provider]  fluticasone Aleda Grana) 50 MCG/ACT nasal spray INSTILL 1 SPRAY IN EACH NOSTRIL DAILY 04/15/23   Karie Georges, MD  furosemide (LASIX) 40 MG tablet Take 0.5 tablets (20 mg total) by mouth daily. Patient taking differently: Take 40 mg by mouth in the morning. 08/08/20   Noni Saupe, MD  glipiZIDE (GLUCOTROL) 10 MG tablet Take 10 mg by mouth in the morning.    [provider]  hydrocortisone (ANUSOL-HC) 25 MG suppository Place 25 mg rectally 2 (two) times daily as needed for hemorrhoids.  [provider]  hydrocortisone 2.5 % cream Apply 1 Application topically 2 (two) times daily as needed (hemorrhoids).    [provider]  hyoscyamine (LEVBID) 0.375 MG 12 hr tablet Take 0.375 mg by mouth 2 (two) times daily as needed for cramping. 08/07/22   [provider]  levothyroxine (SYNTHROID) 75 MCG tablet Take 75 mcg by mouth daily before breakfast.    [provider]  LINZESS 72 MCG capsule Take 72 mcg by mouth every morning. 11/27/22   [provider]  metoprolol tartrate (LOPRESSOR) 100 MG tablet Take 1 tablet (100 mg  total) by mouth 2 (two) times daily. 06/16/20   Lezlie Lye, Meda Coffee, MD  nystatin (MYCOSTATIN) 100000 UNIT/ML suspension Use as directed 5 mLs in the mouth or throat 4 (four) times daily.    [provider]  omeprazole (PRILOSEC) 20 MG capsule Take 1 capsule (20 mg total) by mouth 2 (two) times daily before a meal. 12/15/22   Lewie Chamber, MD  ondansetron (ZOFRAN) 4 MG tablet Take 1 tablet (4 mg total) by mouth every 6 (six) hours. Patient taking differently: Take 4 mg by mouth every 6 (six) hours as needed for nausea or vomiting. 11/23/22   Carmel Sacramento A, PA-C  QUEtiapine (SEROQUEL) 50 MG tablet Take 0.5 tablets (25 mg total) by mouth at bedtime. 04/02/23   Karie Georges, MD  vitamin E 180 MG (400 UNITS) capsule Take 400 Units by mouth in the morning.    [provider]      Allergies    Ace inhibitors, Codeine, Januvia [sitagliptin], Metformin and related, Tessalon perles [benzonatate], Ciprocin-fluocin-procin [fluocinolone acetonide], Clonidine derivatives, Crestor [rosuvastatin calcium], Penicillins, and Zocor [simvastatin]    Review of Systems   Review of Systems  Gastrointestinal:  Positive for hematochezia.  All other systems reviewed and are negative.   Physical Exam Updated Vital Signs BP (!) 156/91 (BP Location: Right Arm)   Pulse 86   Temp 98.3 F (36.8 C) (Oral)   Resp 17   Ht 5\' 5"  (1.651 m)   Wt 73.4 kg   SpO2 97%   BMI 26.93 kg/m   Physical Exam Vitals and nursing note reviewed.  Constitutional:      Appearance: She is well-developed.  HENT:     Head: Normocephalic and atraumatic.  Eyes:     Conjunctiva/sclera: Conjunctivae normal.     Pupils: Pupils are equal, round, and reactive to light.  Cardiovascular:     Rate and Rhythm: Normal rate and regular rhythm.     Heart sounds: Normal heart sounds.  Pulmonary:     Effort: Pulmonary effort is normal.     Breath sounds: Normal breath sounds.  Abdominal:     General: Bowel sounds  are normal.     Palpations: Abdomen is soft.  Genitourinary:    Comments: Multiple large, non-thrombosed external hemorrhoids noted, there is scant amount of bright red blood surrounding rectum and in adult brief Musculoskeletal:        General: Normal range of motion.     Cervical back: Normal range of motion.  Skin:    General: Skin is warm and dry.  Neurological:     Mental Status: She is alert and oriented to person, place, and time.     ED Results / Procedures / Treatments   Labs (all labs ordered are listed, but only abnormal results are displayed) Labs Reviewed  CBC WITH DIFFERENTIAL/PLATELET  COMPREHENSIVE METABOLIC PANEL  LIPASE, BLOOD  URINALYSIS, W/  REFLEX TO CULTURE (INFECTION SUSPECTED)  POC OCCULT BLOOD, ED  TYPE AND SCREEN    EKG None  Radiology No results found.  Procedures Procedures  {Document cardiac monitor, telemetry assessment procedure when appropriate:1}  Medications Ordered in ED Medications - No data to display  ED Course/ Medical Decision Making/ A&P   {   Click here for ABCD2, HEART and other calculatorsREFRESH Note before signing :1}                              Medical Decision Making Amount and/or Complexity of Data Reviewed Labs: ordered. Radiology: ordered.   ***  {Document critical care time when appropriate:1} {Document review of labs and clinical decision tools ie heart score, Chads2Vasc2 etc:1}  {Document your independent review of radiology images, and any outside records:1} {Document your discussion with family members, caretakers, and with consultants:1} {Document social determinants of health affecting pt's care:1} {Document your decision making why or why not admission, treatments were needed:1} Final Clinical Impression(s) / ED Diagnoses Final diagnoses:  None    Rx / DC Orders ED Discharge Orders     None

## 2023-10-26 ENCOUNTER — Emergency Department (HOSPITAL_COMMUNITY): Payer: Medicare Other

## 2023-10-26 MED ORDER — IOHEXOL 350 MG/ML SOLN
55.0000 mL | Freq: Once | INTRAVENOUS | Status: AC | PRN
  Administered 2023-10-26: 55 mL via INTRAVENOUS

## 2023-10-26 MED ORDER — HYDROCORTISONE (PERIANAL) 2.5 % EX CREA: 1.0000 | TOPICAL_CREAM | Freq: Two times a day (BID) | CUTANEOUS | 0 refills | Status: AC

## 2023-10-26 MED ORDER — HYDROCORTISONE ACETATE 25 MG RE SUPP: 25.0000 mg | Freq: Two times a day (BID) | RECTAL | 0 refills | Status: DC

## 2023-10-26 NOTE — Discharge Instructions (Signed)
Your labs today were normal. I recommend that you follow-up with your GI specialist. Can continue using your anusol cream and suppositories to help with hemorrhoids. Return here for new concerns.

## 2023-12-19 ENCOUNTER — Encounter: Payer: Self-pay | Admitting: Podiatry

## 2023-12-19 ENCOUNTER — Ambulatory Visit: Payer: Medicare Other | Admitting: Podiatry

## 2023-12-19 ENCOUNTER — Ambulatory Visit (INDEPENDENT_AMBULATORY_CARE_PROVIDER_SITE_OTHER): Payer: Medicare Other

## 2023-12-19 DIAGNOSIS — L403 Pustulosis palmaris et plantaris: Secondary | ICD-10-CM

## 2023-12-19 DIAGNOSIS — M778 Other enthesopathies, not elsewhere classified: Secondary | ICD-10-CM | POA: Diagnosis not present

## 2023-12-19 DIAGNOSIS — M79674 Pain in right toe(s): Secondary | ICD-10-CM | POA: Diagnosis not present

## 2023-12-19 DIAGNOSIS — L600 Ingrowing nail: Secondary | ICD-10-CM

## 2023-12-19 DIAGNOSIS — M79675 Pain in left toe(s): Secondary | ICD-10-CM

## 2023-12-19 DIAGNOSIS — B351 Tinea unguium: Secondary | ICD-10-CM | POA: Diagnosis not present

## 2023-12-19 NOTE — Progress Notes (Signed)
Subjective:   Patient ID: Sherri Burns, female   DOB: 88 y.o.   MRN: [id number]   HPI Chief Complaint  Patient presents with   Foot Pain    RM#14 Bilateral foot pain lesions on the bottom of both feet.and pain associated with the lesions.   88 year old female presents the office with above concerns.  She states that she has had skin lesions that she discusses with her PCP and thinks that she has a pustular pustulosis.  She was started on clobetasol and this has been helping she reports.  She still gets some pain associated with this.  She does not have any open lesions or drainage.  She also has thick, elongated nails that she is not able to trim her self and they are cause discomfort.   Review of Systems  All other systems reviewed and are negative.  Past Medical History:  Diagnosis Date   Abnormal EKG 02/07/2016   Inferolateral T wave inversion and ST depression.   Acute calculous cholecystitis    Anxiety    Arthritis    Chronic diastolic CHF (congestive heart failure) (HCC) 02/21/2018   Colon polyps 11/2008   tubular adenoma   Diabetes mellitus    Esophageal problem    esophageal dilation   GERD (gastroesophageal reflux disease)    + hpylori   GI bleeding 01/2018   Headache    History of chicken pox    Hyperlipidemia    Hypertension    Hypothyroidism    Ischemic colitis [facility])    Renal cancer, right (HCC) 2010   s/p Rt nephrectomy by Dr. Earlene Plater   Renal insufficiency     Past Surgical History:  Procedure Laterality Date   ABDOMINAL HYSTERECTOMY  1978   partial   BIOPSY  12/14/2022   Procedure: BIOPSY;  Surgeon: Benancio Deeds, MD;  Location: [facility] ENDOSCOPY;  Service: Gastroenterology;;   CATARACT EXTRACTION     CHOLECYSTECTOMY N/A 04/04/2016   Procedure: LAPAROSCOPIC CHOLECYSTECTOMY;  Surgeon: Chevis Pretty III, MD;  Location: [facility] OR;  Service: General;  Laterality: N/A;   COLONOSCOPY     ESOPHAGEAL DILATION  2016   ESOPHAGOGASTRODUODENOSCOPY (EGD) WITH  PROPOFOL N/A 12/14/2022   Procedure: ESOPHAGOGASTRODUODENOSCOPY (EGD) WITH PROPOFOL;  Surgeon: Benancio Deeds, MD;  Location: [facility] ENDOSCOPY;  Service: Gastroenterology;  Laterality: N/A;   HERNIA REPAIR     LAPAROSCOPIC CHOLECYSTECTOMY  04/04/2016   NEPHRECTOMY Right 1982   Dr. Earlene Plater, urology, due to renal cancer     Current Outpatient Medications:    acetaminophen (TYLENOL) 500 MG tablet, Take 500-1,000 mg by mouth daily as needed for headache, fever, moderate pain or mild pain., Disp: , Rfl:    allopurinol (ZYLOPRIM) 100 MG tablet, Take 1 tablet (100 mg total) by mouth daily. (Patient taking differently: Take 100 mg by mouth in the morning.), Disp: 30 tablet, Rfl: 0   amLODipine (NORVASC) 10 MG tablet, Take 10 mg by mouth in the morning., Disp: , Rfl:    Cholecalciferol (VITAMIN D3 SUPER STRENGTH) 50 MCG (2000 UT) CAPS, Take 2,000 Units by mouth in the morning., Disp: , Rfl:    citalopram (CELEXA) 20 MG tablet, TAKE 1 TABLET (20 MG TOTAL) BY MOUTH DAILY, Disp: 90 tablet, Rfl: 0   clobetasol ointment (TEMOVATE) 0.05 %, Apply 1 Application topically 2 (two) times daily. Use the ointment for 14 days, then stop. May restart if the skin condition returns., Disp: 60 g, Rfl: 2   diclofenac Sodium (VOLTAREN) 1 % GEL, Apply 2  g topically 4 (four) times daily., Disp: 350 g, Rfl: 5   dicyclomine (BENTYL) 10 MG capsule, TAKE ONE CAPSULE BY MOUTH TWICE A DAY (Patient taking differently: Take 10 mg by mouth in the morning.), Disp: 60 capsule, Rfl: 4   ezetimibe (ZETIA) 10 MG tablet, Take 10 mg by mouth in the morning., Disp: , Rfl:    fluticasone (FLONASE) 50 MCG/ACT nasal spray, INSTILL 1 SPRAY IN EACH NOSTRIL DAILY, Disp: 16 g, Rfl: 10   furosemide (LASIX) 40 MG tablet, Take 0.5 tablets (20 mg total) by mouth daily. (Patient taking differently: Take 40 mg by mouth in the morning.), Disp: 90 tablet, Rfl: 0   glipiZIDE (GLUCOTROL) 10 MG tablet, Take 10 mg by mouth in the morning., Disp: , Rfl:     hydrocortisone (ANUSOL-HC) 2.5 % rectal cream, Place 1 Application rectally 2 (two) times daily., Disp: 30 g, Rfl: 0   hydrocortisone (ANUSOL-HC) 25 MG suppository, Place 1 suppository (25 mg total) rectally 2 (two) times daily., Disp: 12 suppository, Rfl: 0   hyoscyamine (LEVBID) 0.375 MG 12 hr tablet, Take 0.375 mg by mouth 2 (two) times daily as needed for cramping., Disp: , Rfl:    levothyroxine (SYNTHROID) 75 MCG tablet, Take 75 mcg by mouth daily before breakfast., Disp: , Rfl:    LINZESS 72 MCG capsule, Take 72 mcg by mouth every morning., Disp: , Rfl:    metoprolol tartrate (LOPRESSOR) 100 MG tablet, Take 1 tablet (100 mg total) by mouth 2 (two) times daily., Disp: 180 tablet, Rfl: 1   nystatin (MYCOSTATIN) 100000 UNIT/ML suspension, Use as directed 5 mLs in the mouth or throat 4 (four) times daily., Disp: , Rfl:    omeprazole (PRILOSEC) 20 MG capsule, Take 1 capsule (20 mg total) by mouth 2 (two) times daily before a meal., Disp: 60 capsule, Rfl: 3   ondansetron (ZOFRAN) 4 MG tablet, Take 1 tablet (4 mg total) by mouth every 6 (six) hours. (Patient taking differently: Take 4 mg by mouth every 6 (six) hours as needed for nausea or vomiting.), Disp: 12 tablet, Rfl: 0   QUEtiapine (SEROQUEL) 50 MG tablet, Take 0.5 tablets (25 mg total) by mouth at bedtime., Disp: 90 tablet, Rfl: 1   vitamin E 180 MG (400 UNITS) capsule, Take 400 Units by mouth in the morning., Disp: , Rfl:   Allergies  Allergen Reactions   Ace Inhibitors Swelling    See 02/16/19    Codeine Other (See Comments)    Hallucinations    Januvia [Sitagliptin] Diarrhea   Metformin And Related Other (See Comments)    GI upset   Tessalon Perles [Benzonatate] Other (See Comments)    GI upset   Ciprocin-Fluocin-Procin [Fluocinolone Acetonide] Other (See Comments)    Unknown reaction   Clonidine Derivatives Other (See Comments)    Dry mouth   Crestor [Rosuvastatin Calcium] Other (See Comments)    Myalgias    Penicillins Other  (See Comments)    Hallucinations    Zocor [Simvastatin] Other (See Comments)    GI upset           Objective:  Physical Exam  General: AAO x3, NAD  Dermatological: Skin rash, lesions noted as pictured below.  There is dry, scaling skin with erythematous base small punctate lesions noted.  There is no open lesions or drainage noted.  Nails are hypertrophic, dystrophic with, consideration.  No edema, erythema to the toenail sites.  Tenderness nails 1-5 bilaterally.  She is doing ingrown toenails noted along the  right hallux as well as second digit nail the right side.  No drainage or pus or signs of infection.       Vascular: Dorsalis Pedis artery and Posterior Tibial artery pedal pulses are palpable bilateral with immedate capillary fill time.  There is no pain with calf compression, swelling, warmth, erythema.   Neruologic: Grossly intact via light touch bilateral.   Musculoskeletal: She does get discomfort with nails as well as along the skin lesions.       Assessment:   Palmoplantar pustulosis; symptomatic onychosis     Plan:  -Treatment options discussed including all alternatives, risks, and complications -Etiology of symptoms were discussed  Skin lesions, palmoplantar pustulosis -She is to continue with clobetasol cream.  Without a urea-based cream to this as well which I gave her today.  We discussed external measures including changing shoes and socks regularly using a more cotton type sock. -Symptoms persist we will consider biopsy however she is very afraid of needles so we held off on this today.  Symptomatic onychomycosis. Ingrown toenail -Sharply debrided nails x 10 without any complications or bleeding.  Particularly able to debride the corners of ingrown nails without any complications or bleeding.  Monitoring signs or symptoms of infection.    Radiology: 3 views of feet were obtained.  No evidence of acute fracture.  Calcaneal spurring present.   Osteopenia present.  Vivi Barrack DPM

## 2024-01-16 ENCOUNTER — Ambulatory Visit: Payer: Medicare HMO | Admitting: Podiatry

## 2024-02-13 ENCOUNTER — Ambulatory Visit: Payer: Medicare HMO | Admitting: Podiatry

## 2024-02-13 ENCOUNTER — Encounter: Payer: Self-pay | Admitting: Podiatry

## 2024-02-13 DIAGNOSIS — R21 Rash and other nonspecific skin eruption: Secondary | ICD-10-CM | POA: Diagnosis not present

## 2024-02-13 DIAGNOSIS — L403 Pustulosis palmaris et plantaris: Secondary | ICD-10-CM | POA: Diagnosis not present

## 2024-02-13 MED ORDER — CLINDAMYCIN HCL 300 MG PO CAPS: 300.0000 mg | ORAL_CAPSULE | Freq: Three times a day (TID) | ORAL | 0 refills | Status: DC

## 2024-02-13 NOTE — Patient Instructions (Signed)

## 2024-02-16 NOTE — Progress Notes (Signed)
  Subjective:  Patient ID: Sherri Burns, female    DOB: Nov 15, 1936,  MRN: [id number]  Chief Complaint  Patient presents with   Foot Pain    RM#11 Follow up on bilateral foot pain due to lesions on bottom of feet the bottom on toes hurt has been applying ointment.    Discussed the use of AI scribe software for clinical note transcription with the patient, who gave verbal consent to proceed.  History of Present Illness Sherri Burns is a 88 year old female with diabetes who presents with foot issues and a possible infection. She is accompanied by her caregiver who lives next door.  She has ongoing foot issues, which she is asking if it is related to her diabetes, with pain and pus under her toe, described as 'little white dots.' She uses creams and soaks her feet in Epsom salt, with the left foot being more affected than the right. She recalls a past foot injury from hitting her foot on the bed, which was previously x-rayed and still causes occasional pain.  She currently has oral thrush and is on nystatin for this.      Objective:    Physical Exam General: AAO x3, NAD  Dermatological: Inspection of the there are small, white pustules noted to the plantar aspect bilateral feet peeling, erythematous rash present.  There is no drainage or pus noted at one of the blisters purulent drainage was noted.  There is no fluctuation or crepitation.  Vascular: Dorsalis Pedis artery and Posterior Tibial artery pedal pulses are 2/4 bilateral with immedate capillary fill time.  There is no pain with calf compression, swelling, warmth, erythema.   Neruologic: Grossly intact via light touch bilateral.   Musculoskeletal: She has tenderness with skin lesion.  No other areas of discomfort.  Gait: Unassisted, Nonantalgic.          Results    Assessment:   1. Skin rash   2. Palmoplantar pustulosis      Plan:  Patient was evaluated and treated and all questions  answered.  Assessment and Plan Assessment & Plan Foot infection Pus-filled lesions, worse on left foot.  Culture taken to identify organism. Clindamycin chosen for antibiotic therapy. - Perform culture of pus from foot lesions.  I debrided one of the lesions today and sent this for culture. - Prescribe clindamycin. - Continue use of topical creams. - Instruct on daily Epsom salt soaks, twice a day for 20 minutes. - Follow up with culture results in five days. - Referral to dermatologist-she is also getting skin issues on her hands.   Return in about 3 weeks (around 03/05/2024).    Vivi Barrack DPM

## 2024-02-17 ENCOUNTER — Telehealth: Payer: Self-pay | Admitting: Podiatry

## 2024-02-17 ENCOUNTER — Encounter: Payer: Self-pay | Admitting: Podiatry

## 2024-02-17 LAB — WOUND CULTURE
MICRO NUMBER:: 16226634
RESULT:: NO GROWTH
SPECIMEN QUALITY:: ADEQUATE

## 2024-02-17 NOTE — Telephone Encounter (Signed)
 Patient is requesting Dr. Ardelle Anton send referral to [facility]. Patient contact telephone number, [phone]

## 2024-02-17 NOTE — Telephone Encounter (Signed)
 Recd culture results from [facility]. I placed in Dr. Gabriel Rung box for review.

## 2024-03-05 ENCOUNTER — Ambulatory Visit: Admitting: Podiatry

## 2024-03-24 ENCOUNTER — Ambulatory Visit: Admitting: Podiatry

## 2024-03-24 DIAGNOSIS — L403 Pustulosis palmaris et plantaris: Secondary | ICD-10-CM | POA: Diagnosis not present

## 2024-03-24 DIAGNOSIS — R21 Rash and other nonspecific skin eruption: Secondary | ICD-10-CM | POA: Diagnosis not present

## 2024-03-24 MED ORDER — CLINDAMYCIN HCL 300 MG PO CAPS: 300.0000 mg | ORAL_CAPSULE | Freq: Three times a day (TID) | ORAL | 0 refills | Status: AC

## 2024-03-24 MED ORDER — GENTAMICIN SULFATE 0.1 % EX OINT: 1.0000 | TOPICAL_OINTMENT | Freq: Three times a day (TID) | CUTANEOUS | 2 refills | Status: DC

## 2024-03-24 NOTE — Progress Notes (Unsigned)
  Subjective:  Patient ID: Sherri Burns, female    DOB: 02-19-1936,  MRN: [id number]  Chief Complaint  Patient presents with   Rash    Pt present for a follow up of " little white dots" pt stated she is getting better the antibiotics that was given to her is helping a lot    History of Present Illness Sherri Burns is a 88 year old female with diabetes who presents with foot issues and a possible infection.  She said the antibiotics helped quite a bit.  Overall she is doing better.  She has an appointment upcoming with dermatology.  She states that overall clindamycin  helps her stomach and other issues.    Objective:    Physical Exam General: AAO x3, NAD  Dermatological: Inspection of the there are small, white pustules noted to the plantar aspect bilateral feet peeling, erythematous rash present.  This appears to be improved today.  There is no drainage or pus noted at one of the blisters purulent drainage was noted.  There is no fluctuation or crepitation.  Vascular: Dorsalis Pedis artery and Posterior Tibial artery pedal pulses are 2/4 bilateral with immedate capillary fill time.  There is no pain with calf compression, swelling, warmth, erythema.   Neruologic: Grossly intact via light touch bilateral.   Musculoskeletal: She has tenderness with skin lesion.  No other areas of discomfort.  Gait: Unassisted, Nonantalgic.       Assessment:   1. Skin rash   2. Palmoplantar pustulosis      Plan:  Patient was evaluated and treated and all questions answered.  Assessment and Plan Assessment & Plan Foot infection Pus-filled lesions, worse on left foot.  Culture taken to identify organism. Clindamycin  chosen for antibiotic therapy and will refill this today as she did seem to have improvement with this. -Discussed biopsy but she has an appointment with dermatology. - Prescribed gentamicin topically. - Reviewed wound culture. -She reports clindamycin  also helped her  stomach.  She reported weight loss.  I recommend her to follow-up with her PCP for these issues.  Return in about 9 weeks (around 05/26/2024) for nail trim, skn rash.  Charity Conch DPM

## 2024-05-11 ENCOUNTER — Ambulatory Visit (INDEPENDENT_AMBULATORY_CARE_PROVIDER_SITE_OTHER): Admitting: Dermatology

## 2024-05-11 ENCOUNTER — Encounter: Payer: Self-pay | Admitting: Dermatology

## 2024-05-11 VITALS — BP 130/86 | HR 77

## 2024-05-11 DIAGNOSIS — L309 Dermatitis, unspecified: Secondary | ICD-10-CM | POA: Diagnosis not present

## 2024-05-11 DIAGNOSIS — L409 Psoriasis, unspecified: Secondary | ICD-10-CM

## 2024-05-11 DIAGNOSIS — L011 Impetiginization of other dermatoses: Secondary | ICD-10-CM | POA: Diagnosis not present

## 2024-05-11 DIAGNOSIS — L219 Seborrheic dermatitis, unspecified: Secondary | ICD-10-CM

## 2024-05-11 DIAGNOSIS — L811 Chloasma: Secondary | ICD-10-CM | POA: Diagnosis not present

## 2024-05-11 DIAGNOSIS — R21 Rash and other nonspecific skin eruption: Secondary | ICD-10-CM | POA: Diagnosis not present

## 2024-05-11 DIAGNOSIS — L608 Other nail disorders: Secondary | ICD-10-CM

## 2024-05-11 NOTE — Progress Notes (Signed)
 New Patient Visit   Subjective  Sherri Burns is a 88 y.o. female accompanied by caregiver who presents for the following: Rash  Patient states she has rash located at the feet and mouth that she would like to have examined. Patient reports the areas have been there for 1 years. She reports the areas are bothersome. Patient reports the areas are itchy. Patient rates irritation 9 out of 10. Patient reports she has not previously been treated for these areas.   The following portions of the chart were reviewed this encounter and updated as appropriate: medications, allergies, medical history  Review of Systems:  No other skin or systemic complaints except as noted in HPI or Assessment and Plan.  Objective  Well appearing patient in no apparent distress; mood and affect are within normal limits.  A focused examination was performed of the following areas: Hands & Feet  Relevant exam findings are noted in the Assessment and Plan.    Left Lateral Plantar Surface Erythematous pustules  Assessment & Plan   1. Unspecified dermatitis of hands and feet - Assessment:  Patient presents with erythema, peeling, and scattered pustules on soles and hands. Previous treatment with clobetasol  cream was ineffective. Differential diagnoses include eczema, psoriasis, or fungal infection. A previous culture ruled out bacterial infection. A biopsy is planned to determine the exact etiology.  - Plan:    Perform skin biopsy of affected area on hands or feet    Prescribe clotrimazole -betamethasone  cream, apply twice daily until follow-up    Follow-up appointment scheduled for second week of July to review biopsy results and consider oral medication if necessary  2. Melasma - Assessment: Patient has dark patches on cheeks consistent with melasma.  - Plan:    Educate patient on importance of sunscreen use    Provide sample of lightening cream with sunscreen  3. Seborrheic dermatitis of scalp -  Assessment: Patient reports itchy scalp, consistent with seborrheic dermatitis.  - Plan:    Prescribe CeraVe zinc shampoo    Instruct patient to apply shampoo, let sit for 3 minutes, then rinse out  4. Benign ethnic hyperpigmentation of toenail - Assessment: Patient mentioned darkening of toenail. Upon examination, this appears to be a normal variant in people of color rather than a fungal infection.  - Plan:    Reassure patient about benign nature of toenail pigmentation    Defer to podiatrist for further management if needed RASH Left Lateral Plantar Surface Epidermal / dermal shaving - Left Lateral Plantar Surface  Lesion diameter (cm):  0.5 Informed consent: discussed and consent obtained   Timeout: patient name, date of birth, surgical site, and procedure verified   Procedure prep:  Patient was prepped and draped in usual sterile fashion Prep type:  Isopropyl alcohol Anesthesia: the lesion was anesthetized in a standard fashion   Anesthetic:  1% lidocaine  w/ epinephrine  1-100,000 buffered w/ 8.4% NaHCO3 Instrument used: flexible razor blade   Hemostasis achieved with: pressure, aluminum chloride and electrodesiccation   Outcome: patient tolerated procedure well   Post-procedure details: sterile dressing applied and wound care instructions given   Dressing type: bandage and petrolatum    Specimen 1 - Surgical pathology Differential Diagnosis: Eczema vs Psoriasis vs other  Check Margins: No  Return in about 3 weeks (around 06/01/2024) for Rash Follow up.  I, Jetta Ager, am acting as Neurosurgeon for [employer], DO.  Documentation: I have reviewed the above documentation for accuracy and completeness, and I agree with the above.  Louana Roup, DO

## 2024-05-11 NOTE — Patient Instructions (Addendum)
 Date: Mon May 11 2024  Hello Sherri Burns,  Thank you for visiting today. Here is a summary of the key instructions:  Medications: - Apply clotrimazole -betamethasone  cream to affected areas on hands and feet twice a day until follow-up appointment - Use CeraVe zinc shampoo for itchy scalp:   - Wash hair with shampoo   - Let it sit for 3 minutes   - Rinse out  Skin Care: - Start wearing sunscreen daily on face - Use Eucerin Radiant tone lightening cream with sunscreen on cheek areas twice a day  Procedures: - Small skin biopsy was performed today to confirm a diagnosis for the chronic pustules on your feet  Follow-up: - Return for follow-up appointment in the second week of July   - Biopsy results will be reviewed   - Possible oral medication may be added to treatment plan  Please reach out if you have any questions or concerns.  Warm regards,  Dr. Louana Roup, Dermatology       Important Information   Due to recent changes in healthcare laws, you may see results of your pathology and/or laboratory studies on MyChart before the doctors have had a chance to review them. We understand that in some cases there may be results that are confusing or concerning to you. Please understand that not all results are received at the same time and often the doctors may need to interpret multiple results in order to provide you with the best plan of care or course of treatment. Therefore, we ask that you please give us  2 business days to thoroughly review all your results before contacting the office for clarification. Should we see a critical lab result, you will be contacted sooner.     If You Need Anything After Your Visit   If you have any questions or concerns for your doctor, please call our main line at [phone]. If no one answers, please leave a voicemail as directed and we will return your call as soon as possible. Messages left after 4 pm will be answered the following  business day.    You may also send us  a message via MyChart. We typically respond to MyChart messages within 1-2 business days.  For prescription refills, please ask your pharmacy to contact our office. Our fax number is [phone].  If you have an urgent issue when the clinic is closed that cannot wait until the next business day, you can page your doctor at the number below.     Please note that while we do our best to be available for urgent issues outside of office hours, we are not available 24/7.    If you have an urgent issue and are unable to reach us , you may choose to seek medical care at your doctor's office, retail clinic, urgent care center, or emergency room.   If you have a medical emergency, please immediately call 911 or go to the emergency department. In the event of inclement weather, please call our main line at [phone] for an update on the status of any delays or closures.  Dermatology Medication Tips: Please keep the boxes that topical medications come in in order to help keep track of the instructions about where and how to use these. Pharmacies typically print the medication instructions only on the boxes and not directly on the medication tubes.   If your medication is too expensive, please contact our office at [phone] or send us  a message through MyChart.    We  are unable to tell what your co-pay for medications will be in advance as this is different depending on your insurance coverage. However, we may be able to find a substitute medication at lower cost or fill out paperwork to get insurance to cover a needed medication.    If a prior authorization is required to get your medication covered by your insurance company, please allow us  1-2 business days to complete this process.   Drug prices often vary depending on where the prescription is filled and some pharmacies may offer cheaper prices.   The website www.goodrx.com contains coupons for  medications through different pharmacies. The prices here do not account for what the cost may be with help from insurance (it may be cheaper with your insurance), but the website can give you the price if you did not use any insurance.  - You can print the associated coupon and take it with your prescription to the pharmacy.  - You may also stop by our office during regular business hours and pick up a GoodRx coupon card.  - If you need your prescription sent electronically to a different pharmacy, notify our office through [facility] or by phone at [phone]

## 2024-05-13 LAB — SURGICAL PATHOLOGY

## 2024-05-14 ENCOUNTER — Ambulatory Visit: Payer: Self-pay | Admitting: Dermatology

## 2024-05-14 MED ORDER — MUPIROCIN 2 % EX OINT: 1.0000 | TOPICAL_OINTMENT | Freq: Two times a day (BID) | CUTANEOUS | 0 refills | Status: AC

## 2024-05-26 ENCOUNTER — Ambulatory Visit: Admitting: Podiatry

## 2024-05-26 DIAGNOSIS — B351 Tinea unguium: Secondary | ICD-10-CM | POA: Diagnosis not present

## 2024-05-26 DIAGNOSIS — M79674 Pain in right toe(s): Secondary | ICD-10-CM

## 2024-05-26 DIAGNOSIS — M79675 Pain in left toe(s): Secondary | ICD-10-CM | POA: Diagnosis not present

## 2024-05-27 NOTE — Progress Notes (Signed)
  Subjective:  Patient ID: Sherri Burns, female    DOB: February 21, 1936,  MRN: [id number]  Chief Complaint  Patient presents with   [facility]    RM#12 [facility]- Nail trim and follow up on rash on feet.     History of Present Illness Sherri Burns is a 88 year old female presents today for concerns of thick, elongated nails that she is not able to trim herself.  No swelling redness or any drainage.  She has follow-up with dermatology for the skin lesion and had a biopsy performed and was given medication for this.  She does not report any open lesions.    Objective:    Physical Exam General: AAO x3, NAD  Dermatological: Inspection of the there are small, erythematous lesions noted with somewhat improvement.  Nails are hypertrophic, dystrophic with yellow, brown discoloration.  There is no edema, area edema or signs of infection.  No open lesions identified otherwise.  Vascular: Dorsalis Pedis artery and Posterior Tibial artery pedal pulses are 2/4 bilateral with immedate capillary fill time.  There is no pain with calf compression, swelling, warmth, erythema.   Neruologic: Grossly intact via light touch bilateral.   Musculoskeletal: She has tenderness with skin lesion.  No other areas of discomfort.  Gait: Unassisted, Nonantalgic.       Assessment:   Symptomatic onychomycosis  Plan:  Patient was evaluated and treated and all questions answered.  Assessment and Plan Assessment & Plan Symptomatic onychomycosis - Sharply debrided nails x 10 without any complications or bleeding.  Discussed medications for nail fungus and she has not started an over-the-counter medication that she already has. - Defer to dermatology for the skin rash.  Return in about 3 months (around 08/26/2024).  Sherri Burns DPM

## 2024-06-02 ENCOUNTER — Other Ambulatory Visit: Payer: Self-pay | Admitting: Podiatry

## 2024-06-03 ENCOUNTER — Ambulatory Visit (INDEPENDENT_AMBULATORY_CARE_PROVIDER_SITE_OTHER): Admitting: Dermatology

## 2024-06-03 ENCOUNTER — Other Ambulatory Visit: Payer: Self-pay

## 2024-06-03 ENCOUNTER — Encounter: Payer: Self-pay | Admitting: Dermatology

## 2024-06-03 VITALS — BP 167/62 | HR 67

## 2024-06-03 DIAGNOSIS — L403 Pustulosis palmaris et plantaris: Secondary | ICD-10-CM

## 2024-06-03 DIAGNOSIS — L409 Psoriasis, unspecified: Secondary | ICD-10-CM

## 2024-06-03 DIAGNOSIS — L4050 Arthropathic psoriasis, unspecified: Secondary | ICD-10-CM

## 2024-06-03 DIAGNOSIS — R21 Rash and other nonspecific skin eruption: Secondary | ICD-10-CM

## 2024-06-03 DIAGNOSIS — Z139 Encounter for screening, unspecified: Secondary | ICD-10-CM

## 2024-06-03 MED ORDER — OTEZLA 30 MG PO TABS: 30.0000 mg | ORAL_TABLET | Freq: Two times a day (BID) | ORAL | 11 refills | Status: AC

## 2024-06-03 MED ORDER — CLOBETASOL PROPIONATE 0.05 % EX OINT: 1.0000 | TOPICAL_OINTMENT | Freq: Two times a day (BID) | CUTANEOUS | 3 refills | Status: DC

## 2024-06-03 NOTE — Patient Instructions (Addendum)
 Date: Wed Jun 03 2024  Dear Ms. Barritt,  Thank you for visiting today. Here is a summary of the key instructions:  - Medications:   - Continue using Clobetasol  cream once a day   - Start Otezla  (oral medication)     - Use the starter pack provided     - Begin with a low dose and slowly increase as directed   - You may use Imodium or Pepto-Bismol with Otezla  for the first two weeks if needed  - Lifestyle Changes:   - Pause MiraLAX  for now   - If constipation returns, you can restart MiraLAX   - Follow-up:   - Return for a follow-up appointment in 3 to 4 months  - Tests:   - Get blood tests (CBC and CMP) using the provided lab slip  - Other Instructions:   - Expect a call from [facility] pharmacy to coordinate medication   - Call the office if you run out of Otezla  samples before insurance approval   - Monitor for any changes in joint pain, especially in the morning   - The biopsy site will heal with time  Please reach out if you have any questions or concerns.  Warm regards,  Dr. Delon Lenis [facility]   Important Information  Due to recent changes in healthcare laws, you may see results of your pathology and/or laboratory studies on MyChart before the doctors have had a chance to review them. We understand that in some cases there may be results that are confusing or concerning to you. Please understand that not all results are received at the same time and often the doctors may need to interpret multiple results in order to provide you with the best plan of care or course of treatment. Therefore, we ask that you please give us  2 business days to thoroughly review all your results before contacting the office for clarification. Should we see a critical lab result, you will be contacted sooner.   If You Need Anything After Your Visit  If you have any questions or concerns for your doctor, please call our main line at [phone] If no one answers, please leave a voicemail  as directed and we will return your call as soon as possible. Messages left after 4 pm will be answered the following business day.   You may also send us  a message via MyChart. We typically respond to MyChart messages within 1-2 business days.  For prescription refills, please ask your pharmacy to contact our office. Our fax number is [phone].  If you have an urgent issue when the clinic is closed that cannot wait until the next business day, you can page your doctor at the number below.    Please note that while we do our best to be available for urgent issues outside of office hours, we are not available 24/7.   If you have an urgent issue and are unable to reach us , you may choose to seek medical care at your doctor's office, retail clinic, urgent care center, or emergency room.  If you have a medical emergency, please immediately call 911 or go to the emergency department. In the event of inclement weather, please call our main line at [phone] for an update on the status of any delays or closures.  Dermatology Medication Tips: Please keep the boxes that topical medications come in in order to help keep track of the instructions about where and how to use these. Pharmacies typically print the medication instructions  only on the boxes and not directly on the medication tubes.   If your medication is too expensive, please contact our office at [phone] or send us  a message through MyChart.   We are unable to tell what your co-pay for medications will be in advance as this is different depending on your insurance coverage. However, we may be able to find a substitute medication at lower cost or fill out paperwork to get insurance to cover a needed medication.   If a prior authorization is required to get your medication covered by your insurance company, please allow us  1-2 business days to complete this process.  Drug prices often vary depending on where the prescription is  filled and some pharmacies may offer cheaper prices.  The website www.goodrx.com contains coupons for medications through different pharmacies. The prices here do not account for what the cost may be with help from insurance (it may be cheaper with your insurance), but the website can give you the price if you did not use any insurance.  - You can print the associated coupon and take it with your prescription to the pharmacy.  - You may also stop by our office during regular business hours and pick up a GoodRx coupon card.  - If you need your prescription sent electronically to a different pharmacy, notify our office through [facility] or by phone at [phone]

## 2024-06-03 NOTE — Progress Notes (Signed)
 Follow-Up Visit   Subjective  Sherri Burns is a 88 y.o. female who presents for the following: Follow up for Psoriasis    The following portions of the chart were reviewed this encounter and updated as appropriate: medications, allergies, medical history  Review of Systems:  No other skin or systemic complaints except as noted in HPI or Assessment and Plan.  Objective  Well appearing patient in no apparent distress; mood and affect are within normal limits.   A focused examination was performed of the following areas: Bilateral arms, hand and feet and face  Relevant exam findings are noted in the Assessment and Plan.    Assessment & Plan   PSORIASIS  and Psoriatic Arhtritis Exam: Well-demarcated erythematous papules/plaques with silvery scale, guttate pink scaly papules. 10% BSA.  flared  patient c/o joint pain  LabS: TB, CBC, CMP (pending need to change to biologic)  - Assessment: Patient's psoriasis confirmed by biopsy, primarily affecting hands with symptoms including peeling, itching, and redness around the nails. Topical betamethasone  has provided some improvement, reducing redness, but has not fully resolved the condition. No reported joint pain. Previous treatment with topical medications.  - Plan:    Continue betamethasone  topically once daily    Initiate Otezla  (apremilast ) orally     - Provide starter pack with instructions for gradual dose increase     - Informed patient of potential side effect of diarrhea in first two weeks     - Advised to use Imodium or Pepto-Bismol as needed to manage diarrhea     - Discontinue MiraLAX  temporarily    Order CBC and CMP    Discontinue topical medication containing M (likely mupirocin )    Provided two sample packs of Otezla      - Instructed to call if more samples needed before insurance approval    Informed patient that [facility] pharmacy will contact for medication coordination    Monitor for development of joint  pain (potential psoriatic arthritis)  Follow-up in 3-4 months to assess response to Otezla . Expect clear or almost clear skin at follow-up. If inadequate response, consider switching to alternative medication.  Patient Education Discussed During Visit: Psoriasis - severe on systemic treatment.  Psoriasis is a chronic non-curable, but treatable genetic/hereditary disease that may have other systemic features affecting other organ systems such as joints (Psoriatic Arthritis).  It is linked with heart disease, inflammatory bowel disease, non-alcoholic fatty liver disease, and depression. Significant skin psoriasis and/or psoriatic arthritis may have significant symptoms and affects activities of daily activity and often benefits from systemic treatments.  These systemic treatments have some potential side effects including immunosuppression and require pre-treatment laboratory screening and periodic laboratory monitoring and periodic in person evaluation and monitoring by the attending dermatologist physician (long term medication management).   Reviewed risks of biologics including immunosuppression, infections, injection site reaction, and failure to improve condition. Goal is control of skin condition, not cure.  Some older biologics such as Humira and Enbrel may slightly increase risk of malignancy and may worsen congestive heart failure.  Taltz and Cosentyx may cause inflammatory bowel disease to flare. The use of biologics requires long term medication management, including periodic office visits and monitoring of blood work.   Treatment Plan: -Continue betamethazone  -Start Otezla , take imodium with it for the first few weeks or with any upset stomach   Long term medication management.  Patient is using long term (months to years) prescription medication  to control their dermatologic condition.  These  medications require periodic monitoring to evaluate for efficacy and side effects and may require  periodic laboratory monitoring.    Pt is not a candidate for methotrexate or cyclosporine due to inability for follow up labs and contraindications with other medications. Pt has no access to a light box for phototherapy.  Due to the progressive and chronic nature of her psoriasis, patient has tried and failed numerous topicals creams as noted above, the next best therapeutic option is a systemic therapy. It is medically necessary to help improve her quality of life.     No follow-ups on file.  IBerwyn Lesches, Surg Tech III, am acting as scribe for [employer], DO.   Documentation: I have reviewed the above documentation for accuracy and completeness, and I agree with the above.  Delon Lenis, DO

## 2024-06-28 ENCOUNTER — Encounter: Payer: Self-pay | Admitting: Dermatology

## 2024-07-15 ENCOUNTER — Other Ambulatory Visit: Payer: Self-pay

## 2024-07-15 DIAGNOSIS — L4050 Arthropathic psoriasis, unspecified: Secondary | ICD-10-CM

## 2024-07-15 DIAGNOSIS — L403 Pustulosis palmaris et plantaris: Secondary | ICD-10-CM

## 2024-07-15 MED ORDER — SKYRIZI PEN 150 MG/ML ~~LOC~~ SOAJ: 150.0000 mg | SUBCUTANEOUS | 1 refills | Status: AC

## 2024-07-15 MED ORDER — SKYRIZI PEN 150 MG/ML ~~LOC~~ SOAJ: 150.0000 mg | SUBCUTANEOUS | 11 refills | Status: AC

## 2024-08-04 NOTE — Progress Notes (Signed)
 Spoke with patient and advised that her insurance denied the appeal for Otezla  and per her insurance she has to try and fail a different Biologic. Dr. Alm reviewed medications list and suggested switching to Skyrizi . Patient voiced understanding.

## 2024-08-06 ENCOUNTER — Encounter: Payer: Self-pay | Admitting: Dermatology

## 2024-08-10 ENCOUNTER — Other Ambulatory Visit: Payer: Self-pay

## 2024-08-10 ENCOUNTER — Emergency Department (HOSPITAL_COMMUNITY)

## 2024-08-10 ENCOUNTER — Emergency Department (HOSPITAL_COMMUNITY)
Admission: EM | Admit: 2024-08-10 | Discharge: 2024-08-10 | Disposition: A | Discharge status: Home / Self Care | Attending: Emergency Medicine | Admitting: Emergency Medicine

## 2024-08-10 ENCOUNTER — Encounter (HOSPITAL_COMMUNITY): Payer: Self-pay | Admitting: Emergency Medicine

## 2024-08-10 DIAGNOSIS — K644 Residual hemorrhoidal skin tags: Secondary | ICD-10-CM | POA: Diagnosis not present

## 2024-08-10 DIAGNOSIS — R310 Gross hematuria: Secondary | ICD-10-CM | POA: Insufficient documentation

## 2024-08-10 DIAGNOSIS — K625 Hemorrhage of anus and rectum: Secondary | ICD-10-CM

## 2024-08-10 DIAGNOSIS — R319 Hematuria, unspecified: Secondary | ICD-10-CM | POA: Diagnosis present

## 2024-08-10 LAB — URINALYSIS, ROUTINE W REFLEX MICROSCOPIC
Bilirubin Urine: NEGATIVE
Glucose, UA: NEGATIVE mg/dL
Ketones, ur: 5 mg/dL — AB
Leukocytes,Ua: NEGATIVE
Nitrite: NEGATIVE
Protein, ur: NEGATIVE mg/dL
Specific Gravity, Urine: 1.009 (ref 1.005–1.030)
pH: 7 (ref 5.0–8.0)

## 2024-08-10 LAB — CBC
HCT: 38.4 % (ref 36.0–46.0)
Hemoglobin: 12.3 g/dL (ref 12.0–15.0)
MCH: 30.4 pg (ref 26.0–34.0)
MCHC: 32 g/dL (ref 30.0–36.0)
MCV: 95 fL (ref 80.0–100.0)
Platelets: 224 K/uL (ref 150–400)
RBC: 4.04 MIL/uL (ref 3.87–5.11)
RDW: 13.8 % (ref 11.5–15.5)
WBC: 7.3 K/uL (ref 4.0–10.5)
nRBC: 0 % (ref 0.0–0.2)

## 2024-08-10 LAB — COMPREHENSIVE METABOLIC PANEL WITH GFR
ALT: 10 U/L (ref 0–44)
AST: 17 U/L (ref 15–41)
Albumin: 4.2 g/dL (ref 3.5–5.0)
Alkaline Phosphatase: 55 U/L (ref 38–126)
Anion gap: 12 (ref 5–15)
BUN: 12 mg/dL (ref 8–23)
CO2: 24 mmol/L (ref 22–32)
Calcium: 10 mg/dL (ref 8.9–10.3)
Chloride: 104 mmol/L (ref 98–111)
Creatinine, Ser: 1.08 mg/dL — ABNORMAL HIGH (ref 0.44–1.00)
GFR, Estimated: 50 mL/min — ABNORMAL LOW (ref >60)
Glucose, Bld: 133 mg/dL — ABNORMAL HIGH (ref 70–99)
Potassium: 4 mmol/L (ref 3.5–5.1)
Sodium: 141 mmol/L (ref 135–145)
Total Bilirubin: 0.9 mg/dL (ref 0.0–1.2)
Total Protein: 7 g/dL (ref 6.5–8.1)

## 2024-08-10 LAB — TYPE AND SCREEN
ABO/RH(D): O POS
Antibody Screen: NEGATIVE

## 2024-08-10 LAB — TROPONIN T, HIGH SENSITIVITY: Troponin T High Sensitivity: 16 ng/L (ref 0–19)

## 2024-08-10 LAB — LIPASE, BLOOD: Lipase: 24 U/L (ref 11–51)

## 2024-08-10 MED ORDER — HYDROCORTISONE ACETATE 25 MG RE SUPP: 25.0000 mg | Freq: Two times a day (BID) | RECTAL | 0 refills | Status: AC

## 2024-08-10 MED ORDER — ONDANSETRON HCL 4 MG/2ML IJ SOLN
4.0000 mg | Freq: Once | INTRAMUSCULAR | Status: AC
Start: 2024-08-10 — End: 2024-08-10
  Administered 2024-08-10: 4 mg via INTRAVENOUS
  Filled 2024-08-10: qty 2

## 2024-08-10 MED ORDER — MORPHINE SULFATE (PF) 4 MG/ML IV SOLN
4.0000 mg | Freq: Once | INTRAVENOUS | Status: AC
  Administered 2024-08-10: 4 mg via INTRAVENOUS
  Filled 2024-08-10: qty 1

## 2024-08-10 MED ORDER — CEPHALEXIN 500 MG PO CAPS: 500.0000 mg | ORAL_CAPSULE | Freq: Four times a day (QID) | ORAL | 0 refills | Status: AC

## 2024-08-10 MED ORDER — ALUM & MAG HYDROXIDE-SIMETH 200-200-20 MG/5ML PO SUSP
30.0000 mL | Freq: Once | ORAL | Status: AC
  Administered 2024-08-10: 30 mL via ORAL
  Filled 2024-08-10: qty 30

## 2024-08-10 NOTE — ED Provider Notes (Signed)
 Pt signed out by Dr. Emil pending labs and CT scan. CT scan reviewed by me.  I agree with the radiologist.  CBC nl, CMP nl other than cr sl elevated at 1.08 (1.37 in Nov); lip nl; ua + hgb, 5+ ketones; no  UTI  CT renal: No acute findings or explanation for the patient's symptoms. No  evidence of urinary tract calculus or hydronephrosis.  2. Previous right nephrectomy and probable right adrenalectomy.  3. Colonic diverticulosis without evidence of acute inflammation.  4. Multilevel spondylosis with potentially symptomatic spinal  stenosis at L3-4 and L4-5.  5.  Aortic Atherosclerosis (ICD10-I70.0).   CXR: No evidence of active cardiopulmonary process. Aortic  atherosclerosis.    When I go back and talk to the patient, she said the blood is not in her urine.  She thinks it's from her bottom.  She just noticed it after she urinated and wiped.  She has seen GI and colorectal surgery.  She has internal and external hemorrohids.  She did have a colonoscopy done at [facility] on 11/06/23:  3 subcentimeter polyps in the transverse colon were removed with cold forceps biopsy Scattered diverticulosis of mild severity in the ascending colon and sigmoid colon Small hemorrhoids   I did a rectal exam, and pt has several large external hemorrhoids. These are probably the source of the bleeding.  She is out of her hemorrhoid cream.  Hgb 12.3.  She is not on blood thinners.  I think she's stable for d/c.  She has an appt with her GI doctor soon.  She's d/c with rx for her anusol .     Dean Clarity, MD 08/10/24 [phone]

## 2024-08-10 NOTE — ED Triage Notes (Signed)
 BIB EMS from home.  Pt was in the restroom to urinate and when she wiped she saw dark red blood.  Pt is in no pain. Reports hx of hemorhoids but never bleeding this much.  Hypertensive with EMS but has not taken meds today

## 2024-08-10 NOTE — ED Provider Notes (Signed)
 [place] EMERGENCY DEPARTMENT AT [facility] Provider Note   CSN: [id number] Arrival date & time: 08/10/24  1048     Patient presents with: Rectal Bleeding   Sherri Burns is a 88 y.o. female.   88 yo F with a cc of blood in her urine.  Urinates about 4x a night on average.  Always checks her toilet paper when she wipes.  3x looked normal and then last check was grossly bloody.  Never had this happened to her before.  Denies pain in her side, no fevers, no difficulty urinate.  Denies dark stool or blood in their stool.    Rectal Bleeding      Prior to Admission medications   Medication Sig Start Date End Date Taking? Authorizing Provider  cephALEXin  (KEFLEX ) 500 MG capsule Take 1 capsule (500 mg total) by mouth 4 (four) times daily. 08/10/24  Yes Emil Share, DO  acetaminophen  (TYLENOL ) 500 MG tablet Take 500-1,000 mg by mouth daily as needed for headache, fever, moderate pain or mild pain.    [provider]  allopurinol  (ZYLOPRIM ) 100 MG tablet Take 1 tablet (100 mg total) by mouth daily. 11/11/20   Gershon Donnice SAUNDERS, DPM  amLODipine  (NORVASC ) 10 MG tablet Take 10 mg by mouth in the morning.    [provider]  Apremilast  (OTEZLA ) 30 MG TABS Take 1 tablet (30 mg total) by mouth 2 (two) times daily. 06/03/24   Alm Delon SAILOR, DO  Cholecalciferol  (VITAMIN D3 SUPER STRENGTH) 50 MCG (2000 UT) CAPS Take 2,000 Units by mouth in the morning.    [provider]  citalopram  (CELEXA ) 20 MG tablet TAKE 1 TABLET (20 MG TOTAL) BY MOUTH DAILY 06/12/23   Ozell Heron HERO, MD  clindamycin  (CLEOCIN ) 300 MG capsule Take 1 capsule (300 mg total) by mouth 3 (three) times daily. 03/24/24   Gershon Donnice SAUNDERS, DPM  clobetasol  ointment (TEMOVATE ) 0.05 % Apply 1 Application topically 2 (two) times daily. Use the ointment for 14 days, then stop. May restart if the skin condition returns. 06/03/24   Alm Delon SAILOR, DO  diclofenac  Sodium (VOLTAREN ) 1 % GEL Apply 2  g topically 4 (four) times daily. 12/25/22   Ozell Heron HERO, MD  dicyclomine  (BENTYL ) 10 MG capsule TAKE ONE CAPSULE BY MOUTH TWICE A DAY 01/31/22   Zehr, Jessica D, PA-C  ezetimibe (ZETIA) 10 MG tablet Take 10 mg by mouth in the morning. 05/25/22   [provider]  fluticasone  (FLONASE ) 50 MCG/ACT nasal spray INSTILL 1 SPRAY IN EACH NOSTRIL DAILY 04/15/23   Ozell Heron HERO, MD  furosemide  (LASIX ) 40 MG tablet Take 0.5 tablets (20 mg total) by mouth daily. 08/08/20   Melonie Tori Mikel HERO, MD  gentamicin  ointment (GARAMYCIN ) 0.1 % APPLY 1 APPLICATION TOPICALLY THREE (THREE) TIMES DAILY 06/02/24   Gershon Donnice SAUNDERS, DPM  glipiZIDE  (GLUCOTROL ) 10 MG tablet Take 10 mg by mouth in the morning.    [provider]  hydrocortisone  (ANUSOL -HC) 2.5 % rectal cream Place 1 Application rectally 2 (two) times daily. 10/26/23   Jarold Olam HERO, PA-C  hydrocortisone  (ANUSOL -HC) 25 MG suppository Place 1 suppository (25 mg total) rectally 2 (two) times daily. 10/26/23   Jarold Olam HERO, PA-C  hydrOXYzine (ATARAX) 25 MG tablet Take 25 mg by mouth 3 (three) times daily. 05/03/24   [provider]  hyoscyamine (LEVBID) 0.375 MG 12 hr tablet Take 0.375 mg by mouth 2 (two) times daily as needed for cramping. 08/07/22  [provider]  levothyroxine  (SYNTHROID ) 75 MCG tablet Take 75 mcg by mouth daily before breakfast.    [provider]  LINZESS 72 MCG capsule Take 72 mcg by mouth every morning. 11/27/22   [provider]  metoprolol  tartrate (LOPRESSOR ) 100 MG tablet Take 1 tablet (100 mg total) by mouth 2 (two) times daily. 06/16/20   Melonie Tori Mikel CHRISTELLA, MD  mupirocin  ointment (BACTROBAN ) 2 % Apply 1 Application topically 2 (two) times daily. Mix with clotrimazole -betamethasone  to apply to affected areas for 2 weeks, then stop. 05/14/24   Alm Delon SAILOR, DO  nystatin (MYCOSTATIN) 100000 UNIT/ML suspension Use as directed 5 mLs in the mouth or throat 4 (four) times  daily.    [provider]  omeprazole  (PRILOSEC) 20 MG capsule Take 1 capsule (20 mg total) by mouth 2 (two) times daily before a meal. 12/15/22   Patsy Alm, MD  ondansetron  (ZOFRAN ) 4 MG tablet Take 1 tablet (4 mg total) by mouth every 6 (six) hours. 11/23/22   Suellen Cantor A, PA-C  QUEtiapine  (SEROQUEL ) 50 MG tablet Take 0.5 tablets (25 mg total) by mouth at bedtime. 04/02/23   Ozell Heron CHRISTELLA, MD  risankizumab -rzaa (SKYRIZI  PEN) 150 MG/ML pen Inject 1 mL (150 mg total) into the skin as directed. At weeks 0 & 4. 07/15/24   Alm Delon SAILOR, DO  risankizumab -rzaa (SKYRIZI  PEN) 150 MG/ML pen Inject 1 mL (150 mg total) into the skin as directed. Every 12 weeks for maintenance. 07/15/24   Alm Delon SAILOR, DO  tiZANidine (ZANAFLEX) 4 MG tablet Take 4 mg by mouth 3 (three) times daily. 04/21/24   [provider]  vitamin E 180 MG (400 UNITS) capsule Take 400 Units by mouth in the morning.    [provider]    Allergies: Ace inhibitors, Codeine, Januvia  [sitagliptin ], Metformin  and related, Tessalon perles [benzonatate], Ciprocin-fluocin-procin [fluocinolone acetonide], Clonidine derivatives, Crestor  [rosuvastatin  calcium ], Penicillins, and Zocor [simvastatin]    Review of Systems  Gastrointestinal:  Positive for hematochezia.    Updated Vital Signs BP (!) 153/122 (BP Location: Right Arm)   Pulse 74   Temp 98.6 F (37 C) (Oral)   Resp 16   SpO2 99%   Physical Exam Vitals and nursing note reviewed.  Constitutional:      General: She is not in acute distress.    Appearance: She is well-developed. She is not diaphoretic.  HENT:     Head: Normocephalic and atraumatic.  Eyes:     Pupils: Pupils are equal, round, and reactive to light.  Cardiovascular:     Rate and Rhythm: Normal rate and regular rhythm.     Heart sounds: No murmur heard.    No friction rub. No gallop.  Pulmonary:     Effort: Pulmonary effort is normal.     Breath sounds: No wheezing or  rales.  Abdominal:     General: There is no distension.     Palpations: Abdomen is soft.     Tenderness: There is no abdominal tenderness.  Musculoskeletal:        General: No tenderness.     Cervical back: Normal range of motion and neck supple.  Skin:    General: Skin is warm and dry.  Neurological:     Mental Status: She is alert and oriented to person, place, and time.  Psychiatric:        Behavior: Behavior normal.     (all labs ordered are listed, but only abnormal results are  displayed) Labs Reviewed  URINALYSIS, ROUTINE W REFLEX MICROSCOPIC - Abnormal; Notable for the following components:      Result Value   Color, Urine STRAW (*)    Hgb urine dipstick MODERATE (*)    Ketones, ur 5 (*)    Bacteria, UA RARE (*)    All other components within normal limits  CBC  COMPREHENSIVE METABOLIC PANEL WITH GFR  LIPASE, BLOOD  POC OCCULT BLOOD, ED  TYPE AND SCREEN  TROPONIN T, HIGH SENSITIVITY    EKG: None  Radiology: [facility] Chest Port 1 View Result Date: 08/10/2024 CLINICAL DATA:  Chest pain. EXAM: PORTABLE CHEST 1 VIEW COMPARISON:  Radiographs 02/14/2023 and 06/19/2022.  CT 12/13/2022. FINDINGS: 1458 hours. The heart size and mediastinal contours are stable with mild aortic atherosclerosis. The lungs appear clear. No pleural effusion or pneumothorax. Mild degenerative changes in the spine without evidence of acute osseous abnormality. IMPRESSION: No evidence of active cardiopulmonary process. Aortic atherosclerosis. Electronically Signed   By: Elsie Perone M.D.   On: 08/10/2024 15:21     Procedures   Medications Ordered in the ED  morphine  (PF) 4 MG/ML injection 4 mg (4 mg Intravenous Given 08/10/24 1442)  ondansetron  (ZOFRAN ) injection 4 mg (4 mg Intravenous Given 08/10/24 1442)  alum & mag hydroxide-simeth (MAALOX/MYLANTA) 200-200-20 MG/5ML suspension 30 mL (30 mLs Oral Given 08/10/24 1442)                                    Medical Decision Making Amount and/or  Complexity of Data Reviewed Labs: ordered. Radiology: ordered.  Risk OTC drugs. Prescription drug management.   88 yo F with a cc of hematuria.  Patient denies other symptoms.  No abdominal pain, no fevers, no flank pain.    Will obtain labs, UA.  Reassess.    I was notified by nursing that patient was having pain.  Has been complaining of epigastric pain and R flank pain.  Unsure why she didn't bring this up earlier.  Tells me both her legs hurt and she gets a headache at times.  Going on for months.   Will obtain single trop, cxr, ct stone study.  Signed out to Dr. Dean, please see their note for further details of care in the ED.  The patients results and plan were reviewed and discussed.   Any x-rays performed were independently reviewed by myself.   Differential diagnosis were considered with the presenting HPI.  Medications  morphine  (PF) 4 MG/ML injection 4 mg (4 mg Intravenous Given 08/10/24 1442)  ondansetron  (ZOFRAN ) injection 4 mg (4 mg Intravenous Given 08/10/24 1442)  alum & mag hydroxide-simeth (MAALOX/MYLANTA) 200-200-20 MG/5ML suspension 30 mL (30 mLs Oral Given 08/10/24 1442)    Vitals:   08/10/24 1052 08/10/24 1115 08/10/24 1425  BP: (!) 170/90 (!) 160/84 (!) 153/122  Pulse: 77 71 74  Resp: 16  16  Temp: 98.8 F (37.1 C)  98.6 F (37 C)  TempSrc: Oral  Oral  SpO2: 92% 95% 99%    Final diagnoses:  Gross hematuria        Final diagnoses:  Gross hematuria    ED Discharge Orders          Ordered    cephALEXin  (KEFLEX ) 500 MG capsule  4 times daily        08/10/24 1535               Bogart,  Maui Ahart, DO 08/10/24 1535

## 2024-08-11 LAB — POC OCCULT BLOOD, ED: Fecal Occult Bld: NEGATIVE

## 2024-08-12 ENCOUNTER — Encounter: Payer: Self-pay | Admitting: Dermatology

## 2024-08-20 ENCOUNTER — Other Ambulatory Visit: Payer: Self-pay

## 2024-08-20 DIAGNOSIS — L403 Pustulosis palmaris et plantaris: Secondary | ICD-10-CM

## 2024-08-20 MED ORDER — PIMECROLIMUS 1 % EX CREA: TOPICAL_CREAM | Freq: Two times a day (BID) | CUTANEOUS | 9 refills | Status: AC

## 2024-08-20 MED ORDER — CLOBETASOL PROPIONATE 0.05 % EX OINT: 1.0000 | TOPICAL_OINTMENT | Freq: Two times a day (BID) | CUTANEOUS | 9 refills | Status: DC

## 2024-08-31 ENCOUNTER — Ambulatory Visit: Admitting: Podiatry

## 2024-10-05 ENCOUNTER — Ambulatory Visit: Admitting: Podiatry

## 2024-10-14 ENCOUNTER — Ambulatory Visit: Admitting: Dermatology

## 2024-11-05 ENCOUNTER — Ambulatory Visit: Admitting: Podiatry

## 2024-12-10 ENCOUNTER — Ambulatory Visit: Admitting: Podiatry

## 2024-12-10 DIAGNOSIS — L403 Pustulosis palmaris et plantaris: Secondary | ICD-10-CM

## 2024-12-10 DIAGNOSIS — B351 Tinea unguium: Secondary | ICD-10-CM

## 2024-12-10 DIAGNOSIS — M79675 Pain in left toe(s): Secondary | ICD-10-CM | POA: Diagnosis not present

## 2024-12-10 DIAGNOSIS — M79674 Pain in right toe(s): Secondary | ICD-10-CM | POA: Diagnosis not present

## 2024-12-10 MED ORDER — CLOBETASOL PROPIONATE 0.05 % EX OINT: 1.0000 | TOPICAL_OINTMENT | Freq: Two times a day (BID) | CUTANEOUS | 2 refills | Status: AC

## 2024-12-13 NOTE — Progress Notes (Signed)
"  °  Subjective:  Patient ID: Sherri Burns, female    DOB: [birth year],  MRN: [id number]  Chief Complaint  Patient presents with   [facility]    NIDDM Patient with an A1c of 6.9 presents today for [facility] and nail trim, patient reports numbness on her feet.     History of Present Illness Sherri Burns is a 89 year old female presents today for concerns of thick, elongated nails that she is not able to trim herself.  No swelling redness or any drainage.  She has follow-up with dermatology for the skin lesion and had a biopsy performed and was given medication for this.  She states that since she stopped using the clobetasol  is starting to come back and asking for refill.    Objective:    Physical Exam General: AAO x3, NAD  Dermatological: Inspection of the there are small, erythematous lesions noted with somewhat improvement.  Toenails continue to be  hypertrophic, dystrophic with yellow, brown discoloration, mostly the hallux.  There is no edema, area edema or signs of infection.  No open lesions identified otherwise.  Vascular: Dorsalis Pedis artery and Posterior Tibial artery pedal pulses are 2/4 bilateral with immedate capillary fill time.  There is no pain with calf compression, swelling, warmth, erythema.   Neruologic: Grossly intact via light touch bilateral.   Musculoskeletal: She has tenderness with skin lesion.  No other areas of discomfort.  Gait: Unassisted, Nonantalgic.       Assessment:   Symptomatic onychomycosis  Plan:  Patient was evaluated and treated and all questions answered.  Assessment and Plan Assessment & Plan Symptomatic onychomycosis - Sharply debrided nails x 10 without any complications or bleeding.  Discussed medications for nail fungus and she has not started an over-the-counter medication that she already has. - Refill clobetasol .  Follow-up with dermatology.  Return in about 3 months (around 03/10/2025).   Donnice JONELLE Fees DPM "

## 2025-02-24 ENCOUNTER — Ambulatory Visit: Admitting: Dermatology

## 2025-03-11 ENCOUNTER — Ambulatory Visit: Admitting: Podiatry
